# Patient Record
Sex: Female | Born: 1955
Health system: Southern US, Community
[De-identification: ages and names within clinical notes are randomized; demographics above are authoritative.]

## PROBLEM LIST (undated history)

## (undated) DIAGNOSIS — C801 Malignant (primary) neoplasm, unspecified: Secondary | ICD-10-CM

## (undated) HISTORY — PX: CHOLECYSTECTOMY: SHX55

## (undated) HISTORY — PX: TONSILLECTOMY: SUR1361

---

## 2014-10-31 ENCOUNTER — Ambulatory Visit (INDEPENDENT_AMBULATORY_CARE_PROVIDER_SITE_OTHER): Payer: BC Managed Care – PPO

## 2014-10-31 ENCOUNTER — Other Ambulatory Visit: Payer: Self-pay | Admitting: Podiatry

## 2014-10-31 ENCOUNTER — Ambulatory Visit (INDEPENDENT_AMBULATORY_CARE_PROVIDER_SITE_OTHER): Payer: BC Managed Care – PPO | Admitting: Podiatry

## 2014-10-31 ENCOUNTER — Encounter: Payer: Self-pay | Admitting: Podiatry

## 2014-10-31 VITALS — BP 129/78 | HR 61 | Resp 16 | Ht 64.0 in | Wt 190.0 lb

## 2014-10-31 DIAGNOSIS — M779 Enthesopathy, unspecified: Secondary | ICD-10-CM

## 2014-10-31 DIAGNOSIS — L84 Corns and callosities: Secondary | ICD-10-CM

## 2014-10-31 DIAGNOSIS — M2041 Other hammer toe(s) (acquired), right foot: Secondary | ICD-10-CM

## 2014-10-31 MED ORDER — TRIAMCINOLONE ACETONIDE 10 MG/ML IJ SUSP
10.0000 mg | Freq: Once | INTRAMUSCULAR | Status: AC
  Administered 2014-10-31: 10 mg

## 2014-10-31 NOTE — Progress Notes (Signed)
   Subjective:    Patient ID: Stacey Cox, female    DOB: 04-06-1956, 58 y.o.   MRN: [id number]  HPI Comments: "I have a corn"  Patient c/o aching 5th toe right for several years. There is a callused area lateral side. She is unable to wear certain shoes. Tried padding and soaking-no help.  Toe Pain       Review of Systems  HENT: Positive for sinus pressure.   All other systems reviewed and are negative.      Objective:   Physical Exam        Assessment & Plan:

## 2014-11-02 NOTE — Progress Notes (Signed)
Subjective:     Patient ID: Stacey Cox, female   DOB: 17-Sep-1956, 58 y.o.   MRN: [id number]  HPI patient presents stating she's been developing a lot of pain in the fifth toe of her right foot with lesion formation that she uses chemical on which is no longer giving her satisfactory relief. Makes shoe gear very difficult and is becoming increasingly a problem for her   Review of Systems  All other systems reviewed and are negative.      Objective:   Physical Exam  Constitutional: She is oriented to person, place, and time.  Cardiovascular: Intact distal pulses.   Musculoskeletal: Normal range of motion.  Neurological: She is oriented to person, place, and time.  Skin: Skin is warm.  Nursing note and vitals reviewed.  neurovascular status intact with muscle strength adequate and range of motion of the subtalar and midtarsal joint within normal limits. Patient is noted to have good digital perfusion is well oriented 3 and has no equinus condition noted. The right fifth toe has moderate varus rotation present and does have a large keratotic lesion on the middle and proximal phalanx that's very painful when pressed with fluid buildup noted underneath the interphalangeal joint     Assessment:     Hammertoe deformity fifth right with rotational component severe keratotic lesion formation and fluid buildup consistent with interphalangeal joint capsulitis    Plan:     H&P and condition discussed with patient and x-ray reviewed. Today I did an interphalangeal joint injection 3 mg dexamethasone Kenalog 5 mg Xylocaine and debrided the lesion fully and applied pad. Discussed surgical intervention at one point in the future and reappoint when symptomatic

## 2015-01-06 ENCOUNTER — Ambulatory Visit: Payer: BC Managed Care – PPO | Admitting: Podiatry

## 2015-01-08 ENCOUNTER — Ambulatory Visit (INDEPENDENT_AMBULATORY_CARE_PROVIDER_SITE_OTHER): Payer: BC Managed Care – PPO | Admitting: Podiatry

## 2015-01-08 ENCOUNTER — Encounter: Payer: Self-pay | Admitting: Podiatry

## 2015-01-08 VITALS — BP 128/77 | HR 84 | Resp 16

## 2015-01-08 DIAGNOSIS — M2041 Other hammer toe(s) (acquired), right foot: Secondary | ICD-10-CM

## 2015-01-08 NOTE — Progress Notes (Signed)
Subjective:     Patient ID: Stacey Cox, female   DOB: [birth year], 59 y.o.   MRN: [id number]  HPI patient presents stating this fifth toe has really started to hurt me again and I only got about 4 weeks of relief after the last treatment. States it's becoming increasingly hard to wear shoe gear comfortably   Review of Systems     Objective:   Physical Exam Neurovascular status intact with muscle strength adequate and noted to have keratotic lesion and pain fifth toe right foot had a proximal phalanx with fluid buildup around the head of the proximal phalanx and discomfort with palpation    Assessment:     Chronic hammertoe deformity fifth right with keratotic lesion formation    Plan:     Reviewed condition and recommended at this point surgical intervention due to the pain she is experiencing. Explain the surgery to patient and what would be required and allowed her to read a consent form going over all possible complications and alternative treatments. She is willing to undergo surgery understanding risk and signs consent form after review and understands total recovery from this can be a proximally 6 months given all preoperative instructions at this time

## 2015-01-28 ENCOUNTER — Telehealth: Payer: Self-pay | Admitting: *Deleted

## 2015-01-28 NOTE — Telephone Encounter (Signed)
Check on her tomorrow and try to reschedule surgery

## 2015-01-28 NOTE — Telephone Encounter (Signed)
"  I need to figure out what I need to do to cancel my surgery that is scheduled for today at 9:30am.  I've been up all night throwing up."  Would you like to reschedule?  "Let me call you back when I figure out what is going on."  Okay, I hope you feel better.  "Thank you."

## 2015-01-28 NOTE — Telephone Encounter (Signed)
I cancelled patients first post-op appointment.

## 2015-01-30 ENCOUNTER — Telehealth: Payer: Self-pay | Admitting: *Deleted

## 2015-01-30 NOTE — Telephone Encounter (Signed)
I called patient.  Dr. Paulla Dolly wanted to follow-up and see how you are doing.  "This cold has really got me.  I can't seem to get rid of it.  Like my mother says, this is Pneumonia weather.  I will call you guys next week to reschedule my surgery."  Okay, well take care of yourself.

## 2015-02-03 ENCOUNTER — Encounter: Payer: BC Managed Care – PPO | Admitting: Podiatry

## 2017-01-20 ENCOUNTER — Encounter (HOSPITAL_COMMUNITY): Payer: Self-pay | Admitting: Emergency Medicine

## 2017-01-20 DIAGNOSIS — C50312 Malignant neoplasm of lower-inner quadrant of left female breast: Principal | ICD-10-CM | POA: Insufficient documentation

## 2017-01-20 DIAGNOSIS — C7802 Secondary malignant neoplasm of left lung: Secondary | ICD-10-CM | POA: Diagnosis not present

## 2017-01-20 DIAGNOSIS — M545 Low back pain: Secondary | ICD-10-CM | POA: Insufficient documentation

## 2017-01-20 DIAGNOSIS — C7951 Secondary malignant neoplasm of bone: Secondary | ICD-10-CM | POA: Diagnosis not present

## 2017-01-20 DIAGNOSIS — M549 Dorsalgia, unspecified: Secondary | ICD-10-CM | POA: Diagnosis present

## 2017-01-20 DIAGNOSIS — Z803 Family history of malignant neoplasm of breast: Secondary | ICD-10-CM | POA: Insufficient documentation

## 2017-01-20 DIAGNOSIS — F329 Major depressive disorder, single episode, unspecified: Secondary | ICD-10-CM | POA: Insufficient documentation

## 2017-01-20 DIAGNOSIS — F17210 Nicotine dependence, cigarettes, uncomplicated: Secondary | ICD-10-CM | POA: Diagnosis not present

## 2017-01-20 DIAGNOSIS — F419 Anxiety disorder, unspecified: Secondary | ICD-10-CM | POA: Insufficient documentation

## 2017-01-20 DIAGNOSIS — G893 Neoplasm related pain (acute) (chronic): Secondary | ICD-10-CM | POA: Diagnosis not present

## 2017-01-20 NOTE — ED Triage Notes (Signed)
Patient with lower back pain, radiating around her back to her hips and into her groin.  No nausea, no vomiting.  Pain started about a week ago, off and on.  Patient states she did take some Advil tonight, one tablet and it didn't really help.

## 2017-01-21 ENCOUNTER — Emergency Department (HOSPITAL_COMMUNITY): Payer: BC Managed Care – PPO

## 2017-01-21 ENCOUNTER — Observation Stay (HOSPITAL_COMMUNITY): Payer: BC Managed Care – PPO

## 2017-01-21 ENCOUNTER — Observation Stay (HOSPITAL_COMMUNITY)
Admission: EM | Admit: 2017-01-21 | Discharge: 2017-01-22 | Disposition: A | Discharge status: Home / Self Care | Payer: BC Managed Care – PPO | Attending: Internal Medicine | Admitting: Internal Medicine

## 2017-01-21 DIAGNOSIS — C7800 Secondary malignant neoplasm of unspecified lung: Secondary | ICD-10-CM

## 2017-01-21 DIAGNOSIS — N6320 Unspecified lump in the left breast, unspecified quadrant: Secondary | ICD-10-CM | POA: Insufficient documentation

## 2017-01-21 DIAGNOSIS — C799 Secondary malignant neoplasm of unspecified site: Secondary | ICD-10-CM

## 2017-01-21 DIAGNOSIS — F17210 Nicotine dependence, cigarettes, uncomplicated: Secondary | ICD-10-CM | POA: Diagnosis not present

## 2017-01-21 DIAGNOSIS — C7951 Secondary malignant neoplasm of bone: Secondary | ICD-10-CM

## 2017-01-21 DIAGNOSIS — M8458XA Pathological fracture in neoplastic disease, other specified site, initial encounter for fracture: Secondary | ICD-10-CM | POA: Diagnosis not present

## 2017-01-21 DIAGNOSIS — G893 Neoplasm related pain (acute) (chronic): Secondary | ICD-10-CM

## 2017-01-21 DIAGNOSIS — M8440XA Pathological fracture, unspecified site, initial encounter for fracture: Secondary | ICD-10-CM | POA: Insufficient documentation

## 2017-01-21 DIAGNOSIS — C7931 Secondary malignant neoplasm of brain: Secondary | ICD-10-CM

## 2017-01-21 DIAGNOSIS — M545 Low back pain: Secondary | ICD-10-CM | POA: Diagnosis not present

## 2017-01-21 DIAGNOSIS — C50919 Malignant neoplasm of unspecified site of unspecified female breast: Secondary | ICD-10-CM | POA: Diagnosis present

## 2017-01-21 DIAGNOSIS — Z807 Family history of other malignant neoplasms of lymphoid, hematopoietic and related tissues: Secondary | ICD-10-CM | POA: Diagnosis not present

## 2017-01-21 DIAGNOSIS — Z803 Family history of malignant neoplasm of breast: Secondary | ICD-10-CM | POA: Diagnosis not present

## 2017-01-21 DIAGNOSIS — C50912 Malignant neoplasm of unspecified site of left female breast: Secondary | ICD-10-CM

## 2017-01-21 DIAGNOSIS — R590 Localized enlarged lymph nodes: Secondary | ICD-10-CM | POA: Diagnosis not present

## 2017-01-21 LAB — COMPREHENSIVE METABOLIC PANEL
ALT: 37 U/L (ref 14–54)
AST: 34 U/L (ref 15–41)
Albumin: 3.8 g/dL (ref 3.5–5.0)
Alkaline Phosphatase: 122 U/L (ref 38–126)
Anion gap: 10 (ref 5–15)
BUN: 14 mg/dL (ref 6–20)
CHLORIDE: 104 mmol/L (ref 101–111)
CO2: 26 mmol/L (ref 22–32)
Calcium: 9.7 mg/dL (ref 8.9–10.3)
Creatinine, Ser: 0.94 mg/dL (ref 0.44–1.00)
GFR calc non Af Amer: >60 mL/min (ref >60)
Glucose, Bld: 150 mg/dL — ABNORMAL HIGH (ref 65–99)
Potassium: 3.9 mmol/L (ref 3.5–5.1)
SODIUM: 140 mmol/L (ref 135–145)
Total Bilirubin: 0.5 mg/dL (ref 0.3–1.2)
Total Protein: 6.8 g/dL (ref 6.5–8.1)

## 2017-01-21 LAB — I-STAT CHEM 8, ED
BUN: 18 mg/dL (ref 6–20)
CREATININE: 1 mg/dL (ref 0.44–1.00)
Calcium, Ion: 1.16 mmol/L (ref 1.15–1.40)
Chloride: 103 mmol/L (ref 101–111)
Glucose, Bld: 149 mg/dL — ABNORMAL HIGH (ref 65–99)
HEMATOCRIT: 43 % (ref 36.0–46.0)
HEMOGLOBIN: 14.6 g/dL (ref 12.0–15.0)
POTASSIUM: 3.8 mmol/L (ref 3.5–5.1)
SODIUM: 142 mmol/L (ref 135–145)
TCO2: 28 mmol/L (ref 0–100)

## 2017-01-21 LAB — URINALYSIS, ROUTINE W REFLEX MICROSCOPIC
Bacteria, UA: NONE SEEN
Bilirubin Urine: NEGATIVE
GLUCOSE, UA: NEGATIVE mg/dL
Ketones, ur: NEGATIVE mg/dL
Leukocytes, UA: NEGATIVE
NITRITE: NEGATIVE
Protein, ur: NEGATIVE mg/dL
Specific Gravity, Urine: 1.025 (ref 1.005–1.030)
pH: 5 (ref 5.0–8.0)

## 2017-01-21 LAB — CBC
HEMATOCRIT: 42.3 % (ref 36.0–46.0)
Hemoglobin: 13.9 g/dL (ref 12.0–15.0)
MCH: 28.3 pg (ref 26.0–34.0)
MCHC: 32.9 g/dL (ref 30.0–36.0)
MCV: 86 fL (ref 78.0–100.0)
Platelets: 240 10*3/uL (ref 150–400)
RBC: 4.92 MIL/uL (ref 3.87–5.11)
RDW: 15 % (ref 11.5–15.5)
WBC: 11.2 10*3/uL — ABNORMAL HIGH (ref 4.0–10.5)

## 2017-01-21 MED ORDER — ONDANSETRON HCL 4 MG/2ML IJ SOLN
4.0000 mg | Freq: Four times a day (QID) | INTRAMUSCULAR | Status: DC | PRN
Start: 2017-01-21 — End: 2017-01-22
  Administered 2017-01-21: 4 mg via INTRAVENOUS
  Filled 2017-01-21: qty 2

## 2017-01-21 MED ORDER — TECHNETIUM TC 99M MEDRONATE IV KIT
25.0000 | PACK | Freq: Once | INTRAVENOUS | Status: AC | PRN
  Administered 2017-01-21: 25 via INTRAVENOUS

## 2017-01-21 MED ORDER — ONDANSETRON HCL 4 MG PO TABS
4.0000 mg | ORAL_TABLET | Freq: Four times a day (QID) | ORAL | Status: DC
  Filled 2017-01-21: qty 1

## 2017-01-21 MED ORDER — ENOXAPARIN SODIUM 40 MG/0.4ML ~~LOC~~ SOLN
40.0000 mg | SUBCUTANEOUS | Status: DC
  Administered 2017-01-21 – 2017-01-22 (×2): 40 mg via SUBCUTANEOUS
  Filled 2017-01-21 (×3): qty 0.4

## 2017-01-21 MED ORDER — HYDROMORPHONE HCL 2 MG/ML IJ SOLN
0.5000 mg | INTRAMUSCULAR | Status: DC | PRN
  Administered 2017-01-21 (×2): 0.5 mg via INTRAVENOUS
  Filled 2017-01-21 (×3): qty 1

## 2017-01-21 MED ORDER — HYDROMORPHONE HCL 2 MG/ML IJ SOLN
0.5000 mg | Freq: Once | INTRAMUSCULAR | Status: AC
  Administered 2017-01-21: 0.5 mg via INTRAVENOUS
  Filled 2017-01-21: qty 1

## 2017-01-21 MED ORDER — OXYCODONE-ACETAMINOPHEN 5-325 MG PO TABS
1.0000 | ORAL_TABLET | ORAL | Status: DC | PRN
  Administered 2017-01-21 – 2017-01-22 (×3): 1 via ORAL
  Filled 2017-01-21 (×3): qty 1

## 2017-01-21 NOTE — ED Notes (Signed)
MD to come down to speak with patient about transfer to [facility]

## 2017-01-21 NOTE — Progress Notes (Signed)
Patient resting in bed.  Many family members in room visiting.  Patient in good spirits with no complaints.  Scheduled for CT scan of Chest tonight.  No complaints of pain.

## 2017-01-21 NOTE — ED Notes (Signed)
Nuclear Med in for pt injection

## 2017-01-21 NOTE — ED Notes (Signed)
Doctor continued to be at bedside.

## 2017-01-21 NOTE — Progress Notes (Signed)
Oncology Consultation Received  I shall see the patient today. Plz admit to hospitalist service for pain mx  Metastatic Malignancy - Unknown Primary ? Breast with bone and pulmonary mets  Recs -MMG/US breast with core needle biopsy if lesions noted -CT chest -CA 15-3 and CA 27-29 -MRI brain -pain mx per hospitalist -full consult to follow today.  Sullivan Lone MD MS

## 2017-01-21 NOTE — ED Notes (Signed)
Paged IM about pt's request to go to [facility] and for pain medication

## 2017-01-21 NOTE — Consult Note (Signed)
[facility] Surgery Consult Note  Stacey Cox [birth year]  [id number].    Requesting MD: Rivet Chief Complaint/Reason for Consult: Possible metastatic breast cancer  HPI:  Stacey Cox is a 61yo female who presented to [facility] earlier today with 1 month of gradually worsening lower back pain. Denies any trauma. Patient states that her pain became more constant and severe today and that brought her to the ED. She tried OTC medications but today this did not help. Denies radicular symptoms. She also reports a lesion under her left breast. States that his been present for about 2 weeks. It is tender and draining foul smelling drainage. It is slowly growing in size. States that she has never had anything like this before.  Hospital workup: - CT shows left breast skin thickening and ulceration; multiple metastasis in the upper thoracic and lumbar spine including left L3 transverse process and right T12 transverse pathologic fractures; multiple pulmonary nodules measuring up to 9 mm - bone scan pending - cancer antigen labs pending  No significant PMH FH significant for mother and sister with breast cancer; unsure if they underwent genetic testing Abdominal surgical history includes appendectomy and cholecystectomy Anticoagulants:  Smokes 1/2 PPD Employment: professor at [employer]: Review of Systems  Constitutional: Negative.   HENT: Negative.   Eyes: Negative.   Respiratory: Negative.   Cardiovascular: Negative.   Gastrointestinal: Negative.   Genitourinary: Negative.   Musculoskeletal: Positive for joint pain.       Lower back pain  Skin:       Left breast ulcer  Neurological: Negative.   All systems reviewed and otherwise negative except for as above  History reviewed. No pertinent family history.  History reviewed. No pertinent past medical history.  Past Surgical History:  Procedure Laterality Date  . CHOLECYSTECTOMY    . TONSILLECTOMY      Social History:   reports that she has been smoking.  She has never used smokeless tobacco. She reports that she does not drink alcohol or use drugs.  Allergies:  Allergies  Allergen Reactions  . Thorazine [Chlorpromazine] Anaphylaxis     (Not in a hospital admission)  Prior to Admission medications   Not on File    Blood pressure 130/67, pulse 66, temperature 98.8 F (37.1 C), resp. rate 18, SpO2 97 %. Physical Exam: General: pleasant, WD/WN AA female who is laying in bed in NAD HEENT: head is normocephalic, atraumatic.  Sclera are noninjected.  Mouth is pink and moist Heart: regular, rate, and rhythm.  No obvious murmurs, gallops, or rubs noted.  Palpable pedal pulses bilaterally Lungs: CTAB, no wheezes, rhonchi, or rales noted.  Respiratory effort nonlabored Abd: soft, NT/ND, +BS, no masses, hernias, or organomegaly MS: all 4 extremities are symmetrical with no cyanosis, clubbing, or edema. Skin: warm and dry with no masses, lesions, or rashes Psych: A&Ox3 with an appropriate affect. Neuro: CM 2-12 intact, extremity CSM intact bilaterally, normal speech Left breast: 4-5cm circular inframammary ulceration with purulent drainage:     Results for orders placed or performed during the hospital encounter of 01/21/17 (from the past 48 hour(s))  Urinalysis, Routine w reflex microscopic     Status: Abnormal   Collection Time: 01/20/17 11:42 PM  Result Value Ref Range   Color, Urine YELLOW YELLOW   APPearance HAZY (A) CLEAR   Specific Gravity, Urine 1.025 1.005 - 1.030   pH 5.0 5.0 - 8.0   Glucose, UA NEGATIVE NEGATIVE mg/dL   Hgb urine dipstick SMALL (A) NEGATIVE  Bilirubin Urine NEGATIVE NEGATIVE   Ketones, ur NEGATIVE NEGATIVE mg/dL   Protein, ur NEGATIVE NEGATIVE mg/dL   Nitrite NEGATIVE NEGATIVE   Leukocytes, UA NEGATIVE NEGATIVE   RBC / HPF 0-5 0 - 5 RBC/hpf   WBC, UA 0-5 0 - 5 WBC/hpf   Bacteria, UA NONE SEEN NONE SEEN   Squamous Epithelial / LPF 0-5 (A) NONE SEEN   Mucous PRESENT    CBC     Status: Abnormal   Collection Time: 01/20/17 11:48 PM  Result Value Ref Range   WBC 11.2 (H) 4.0 - 10.5 K/uL   RBC 4.92 3.87 - 5.11 MIL/uL   Hemoglobin 13.9 12.0 - 15.0 g/dL   HCT 42.3 36.0 - 46.0 %   MCV 86.0 78.0 - 100.0 fL   MCH 28.3 26.0 - 34.0 pg   MCHC 32.9 30.0 - 36.0 g/dL   RDW 15.0 11.5 - 15.5 %   Platelets 240 150 - 400 K/uL  Comprehensive metabolic panel     Status: Abnormal   Collection Time: 01/20/17 11:48 PM  Result Value Ref Range   Sodium 140 135 - 145 mmol/L   Potassium 3.9 3.5 - 5.1 mmol/L   Chloride 104 101 - 111 mmol/L   CO2 26 22 - 32 mmol/L   Glucose, Bld 150 (H) 65 - 99 mg/dL   BUN 14 6 - 20 mg/dL   Creatinine, Ser 0.94 0.44 - 1.00 mg/dL   Calcium 9.7 8.9 - 10.3 mg/dL   Total Protein 6.8 6.5 - 8.1 g/dL   Albumin 3.8 3.5 - 5.0 g/dL   AST 34 15 - 41 U/L   ALT 37 14 - 54 U/L   Alkaline Phosphatase 122 38 - 126 U/L   Total Bilirubin 0.5 0.3 - 1.2 mg/dL   GFR calc non Af Amer >60 >60 mL/min   GFR calc Af Amer >60 >60 mL/min    Comment: (NOTE) The eGFR has been calculated using the CKD EPI equation. This calculation has not been validated in all clinical situations. eGFR's persistently <60 mL/min signify possible Chronic Kidney Disease.    Anion gap 10 5 - 15  I-Stat Chem 8, ED     Status: Abnormal   Collection Time: 01/21/17 12:54 AM  Result Value Ref Range   Sodium 142 135 - 145 mmol/L   Potassium 3.8 3.5 - 5.1 mmol/L   Chloride 103 101 - 111 mmol/L   BUN 18 6 - 20 mg/dL   Creatinine, Ser 1.00 0.44 - 1.00 mg/dL   Glucose, Bld 149 (H) 65 - 99 mg/dL   Calcium, Ion 1.16 1.15 - 1.40 mmol/L   TCO2 28 0 - 100 mmol/L   Hemoglobin 14.6 12.0 - 15.0 g/dL   HCT 43.0 36.0 - 46.0 %   Dg Cervical Spine 2 Or 3 Views  Result Date: 01/21/2017 CLINICAL DATA:  Findings of metastatic disease on recent CT examination with neck pain EXAM: CERVICAL SPINE - 3 VIEW COMPARISON:  None. FINDINGS: Six cervical segments are well visualized on this exam. The seventh  cervical segment is only partially visualized. No compression deformities are seen. Facet hypertrophic changes are noted commenced with the patient's age. The odontoid is within normal limits. IMPRESSION: Degenerative change without sclerosis to suggest metastatic disease. If clinically indicated MRI or bone scan would be much more sensitive for occult bony metastatic disease Electronically Signed   By: Inez Catalina M.D.   On: 01/21/2017 10:44   Dg Thoracic Spine 2 View  Result Date:  01/21/2017 CLINICAL DATA:  Abnormal CT EXAM: THORACIC SPINE 2 VIEWS COMPARISON:  CT from earlier in the same day FINDINGS: Pedicles are within normal limits. Vertebral body height is well maintained with the exception of T6 which demonstrates very mild compression deformity. This is of uncertain significance. MRI would be helpful for further evaluation as necessary. No other compression deformities are seen. Mild osteophytic changes are noted. The previously seen changes of T12 are not well appreciated on this exam. IMPRESSION: T6 compression deformity of a mild degree. This may be chronic in nature as no prior films are available for comparison. MRI may be helpful for further evaluation as clinically necessary. Electronically Signed   By: Inez Catalina M.D.   On: 01/21/2017 10:42   Dg Lumbar Spine 2-3 Views  Result Date: 01/21/2017 CLINICAL DATA:  Abnormal CT showing metastatic disease at L2 EXAM: LUMBAR SPINE - 2-3 VIEW COMPARISON:  None. FINDINGS: Vertebral body height is well maintained. Mild sclerosis is noted in the posterior inferior aspect of L2 corresponding to that seen on prior CT examination. Mild osteophytic changes are noted. IMPRESSION: L2 metastatic disease similar to that seen on recent CT. Electronically Signed   By: Inez Catalina M.D.   On: 01/21/2017 10:43   Ct Renal Stone Study  Result Date: 01/21/2017 CLINICAL DATA:  Low back and pelvic pain for 1 week. History of cholecystectomy. EXAM: CT ABDOMEN AND PELVIS  WITHOUT CONTRAST TECHNIQUE: Multidetector CT imaging of the abdomen and pelvis was performed following the standard protocol without IV contrast. COMPARISON:  None. FINDINGS: LOWER CHEST: LEFT breast skin thickening and possible ulceration. 4 mm RIGHT lower lobe, 5 mm RIGHT lower lobe subpleural nodule and 9 mm RIGHT middle lobe pulmonary nodule (series 6, image 1/29). Heart size is normal. HEPATOBILIARY: Status post cholecystectomy.  Normal liver. PANCREAS: Normal. SPLEEN: Normal. ADRENALS/URINARY TRACT: Kidneys are orthotopic, demonstrating normal size and morphology. No nephrolithiasis, hydronephrosis; limited assessment for renal masses on this nonenhanced examination. The unopacified ureters are normal in course and caliber. Urinary bladder is partially distended and unremarkable. Normal adrenal glands. STOMACH/BOWEL: Very small hiatal hernia. The stomach, small and large bowel are normal in course and caliber without inflammatory changes, sensitivity decreased by lack of enteric contrast. Moderate sigmoid, mild generalized colon diverticulosis. Normal appendix. VASCULAR/LYMPHATIC: Aortoiliac vessels are normal in course and caliber, trace calcific atherosclerosis. No lymphadenopathy by CT size criteria. REPRODUCTIVE: Normal. OTHER: No intraperitoneal free fluid or free air. MUSCULOSKELETAL: Ill-defined sclerotic lesion L2 without pathologic fracture. Mild sclerosis S1. Mildly expansile lesion LEFT L3 vertebral body with suspected pathologic fracture. Nondisplaced probable pathologic fracture RIGHT T12 transverse process. Moderate sacroiliac osteoarthrosis. IMPRESSION: No acute intra-abdominal or pelvic process. LEFT breast skin thickening and probable ulceration, recommend direct inspection. Multiple metastasis in the upper thoracic and lumbar spine included LEFT L3 transverse process and RIGHT T12 transverse pathologic fractures. Multiple pulmonary nodules measuring to 9 mm. Considering above findings, these  are suspicious for metastasis. Acute findings discussed with and reconfirmed by PA NICOLE PISCIOTTA on 01/21/2017 at 3:29 am. Electronically Signed   By: Elon Alas M.D.   On: 01/21/2017 03:30      Assessment/Plan Left breast ulcer, concern for metastatic breast cancer - family history of breast cancer in mother and sister - patient with gradually worsening lower back pain x1 month - per patient report she has had an ulcerative lesion under left breast for about 2 weeks - CT shows left breast skin thickening and ulceration; multiple metastasis in the upper thoracic  and lumbar spine including left L3 transverse process and right T12 transverse pathologic fractures; multiple pulmonary nodules measuring to 9 mm - bone scan pending - cancer antigen labs pending  Plan - Medicine admit for control of back pain. In regards to possible metastatic breast cancer, patient will need a biopsy of her left breast. If she remains in the hospital through Monday we can do an incisional biopsy on Monday; if she is discharged prior to Monday she will need follow-up at the [facility] for stereotactic or u/s guided biopsy as well as diagnostic mammograms. After these additional studies she will need outpatient follow-up with Dr. Ninfa Linden to discuss treatment options. We will continue to follow.   Jerrye Beavers, [facility] Surgery 01/21/2017, 1:59 PM Pager: [phone] Consults: [phone] Mon-Fri 7:00 am-4:30 pm Sat-Sun 7:00 am-11:30 am

## 2017-01-21 NOTE — ED Notes (Signed)
Pt went to X-ray.   

## 2017-01-21 NOTE — Progress Notes (Signed)
1600 Received pt from ED via stretcher. A&Ox4. Reoriented to room set up. Wound to under left breast with purulent drainage, + odor. Cleansed, wet to dry dressing applied.

## 2017-01-21 NOTE — Consult Note (Addendum)
[name]    HEMATOLOGY/ONCOLOGY CONSULTATION NOTE  Date of Service: 01/21/2017  Patient Care Team: No Pcp Per Patient as PCP - General (General Practice)  CHIEF COMPLAINTS/PURPOSE OF CONSULTATION:  Concern for metastatic breast cancer    HISTORY OF PRESENTING ILLNESS:   Stacey Cox is a wonderful 61 y.o. female who has been referred to [place] by Dr Lucious Groves, DO  for evaluation and management of likely metastatic breast cancer.  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 She also notes having developed left lower inner breast discomfort and  Ulceration over the last several weeks.  Patient has a CT abd renal proctocol to R/o kidney stones that showed Multiple metastasis in the thoracic and lumbar spine included LEFT L3 transverse process and RIGHT T12 transverse pathologic fractures. Multiple pulmonary nodules measuring to 9 mm.   Patient notes no weight loss no night sweat. Last mammogram >10 yrs ago.  No headaches no overt FND FHx of breast cancer in her mother and sister.  Patient noted that she has a phobia of the medical system and therefore had not actively sought a PCP or routine cares. She was anxious about how she how be able to management her live "alone". Supportive counseling was provided.  A CT chest, bone scan and MRI brain were recommended to complete staging workup.  CT chest was done which showed Primary left breast cancer with left axillary and subpectoral adenopathy.  Metastatic disease in the thorax with multiple pulmonary nodules mild mediastinal adenopathy. Left hilar soft tissue prominence may be central pulmonary nodule or adenopathy. Osseous metastatic disease with mild T6 compression fracture with lucent lesion anteriorly and lucencies in the posterior elements of T9 through T12. Fracture of right anterior rib has surrounding callus is likely a pathologic fracture.  Bone  scan was done which showed significant osseous metastatic disease involving spine, calvarium and rt ribs cage.  Patient was admitted for symptoms control and expedited workup. She has been seen by Surgery.  MEDICAL HISTORY:  Patient has had limited PCP followup. No known chronic medical problems  SURGICAL HISTORY: Past Surgical History:  Procedure Laterality Date  . CHOLECYSTECTOMY    . TONSILLECTOMY      SOCIAL HISTORY: Social History   Social History  . Marital status: Single    Spouse name: N/A  . Number of children: N/A  . Years of education: N/A   Occupational History  . Not on file.   Social History Main Topics  . Smoking status: Current Every Day Smoker  . Smokeless tobacco: Never Used  . Alcohol use No  . Drug use: No  . Sexual activity: Not on file   Other Topics Concern  . Not on file   Social History Narrative  . No narrative on file  Cigarette smoker 1/2PPD for about 40 yrs Social alcohol use No drugs Professor at [employer] no children Has  FAMILY HISTORY:  Mother with h/o breast cancer in her 69's Sister with breast cancer in her 70ys and later was diagnosed with multiple myeloma. (Patient is not aware of any specific breast cancer mutations present)  ALLERGIES:  is allergic to thorazine [chlorpromazine].  MEDICATIONS:  Current Facility-Administered Medications  Medication Dose Route Frequency Provider Last Rate Last Dose  . enoxaparin (LOVENOX) injection 40 mg  40 mg Subcutaneous Q24H Juliet Rude, MD   40 mg at 01/21/17 1444  . HYDROmorphone (DILAUDID)  injection 0.5 mg  0.5 mg Intravenous Q4H PRN Holley Raring, MD      . ondansetron [facility]) injection 4 mg  4 mg Intravenous Q6H PRN Jerrye Beavers, PA-C   4 mg at 01/21/17 1638  . ondansetron (ZOFRAN) tablet 4 mg  4 mg Oral Q8H PRN Holley Raring, MD      . oxyCODONE-acetaminophen (PERCOCET/ROXICET) 5-325 MG per tablet 1 tablet  1 tablet Oral Q4H PRN Juliet Rude, MD   1 tablet at  01/21/17 1844    REVIEW OF SYSTEMS:    10 Point review of Systems was done is negative except as noted above.  PHYSICAL EXAMINATION: ECOG PERFORMANCE STATUS: 1 - Symptomatic but completely ambulatory  . Vitals:   01/21/17 0930 01/21/17 1104  BP: 130/91 130/67  Pulse: 73 66  Resp:  18  Temp:  98.8 F (37.1 C)   There were no vitals filed for this visit. .There is no height or weight on file to calculate BMI.  GENERAL:alert, in no acute distress and comfortable SKIN: skin color, texture, turgor are normal, no rashes or significant lesions EYES: normal, conjunctiva are pink and non-injected, sclera clear OROPHARYNX:no exudate, no erythema and lips, buccal mucosa, and tongue normal  NECK: supple, no JVD, thyroid normal size, non-tender, without nodularity LYMPH:  no palpable lymphadenopathy in the cervical, axillary or inguinal LUNGS: clear to auscultation with normal respiratory effort BREAST- left breast ulceration and induration lower inner quadrant with surround skin showing peau de orange appearance. Palpable left axillary LN+ HEART: regular rate & rhythm,  no murmurs and no lower extremity edema ABDOMEN: abdomen soft, non-tender, normoactive bowel sounds  Musculoskeletal: No focal spinal tenderness on palpation. PSYCH: alert & oriented x 3 with fluent speech NEURO: no focal motor/sensory deficits  LABORATORY DATA:  I have reviewed the data as listed  . CBC Latest Ref Rng & Units 01/21/2017 01/20/2017  WBC 4.0 - 10.5 K/uL - 11.2(H)  Hemoglobin 12.0 - 15.0 g/dL 14.6 13.9  Hematocrit 36.0 - 46.0 % 43.0 42.3  Platelets 150 - 400 K/uL - 240    . CMP Latest Ref Rng & Units 01/21/2017 01/20/2017  Glucose 65 - 99 mg/dL 149(H) 150(H)  BUN 6 - 20 mg/dL 18 14  Creatinine 0.44 - 1.00 mg/dL 1.00 0.94  Sodium 135 - 145 mmol/L 142 140  Potassium 3.5 - 5.1 mmol/L 3.8 3.9  Chloride 101 - 111 mmol/L 103 104  CO2 22 - 32 mmol/L - 26  Calcium 8.9 - 10.3 mg/dL - 9.7  Total Protein 6.5 -  8.1 g/dL - 6.8  Total Bilirubin 0.3 - 1.2 mg/dL - 0.5  Alkaline Phos 38 - 126 U/L - 122  AST 15 - 41 U/L - 34  ALT 14 - 54 U/L - 37     RADIOGRAPHIC STUDIES: I have personally reviewed the radiological images as listed and agreed with the findings in the report. Dg Cervical Spine 2 Or 3 Views  Result Date: 01/21/2017 CLINICAL DATA:  Findings of metastatic disease on recent CT examination with neck pain EXAM: CERVICAL SPINE - 3 VIEW COMPARISON:  None. FINDINGS: Six cervical segments are well visualized on this exam. The seventh cervical segment is only partially visualized. No compression deformities are seen. Facet hypertrophic changes are noted commenced with the patient's age. The odontoid is within normal limits. IMPRESSION: Degenerative change without sclerosis to suggest metastatic disease. If clinically indicated MRI or bone scan would be much more sensitive for occult bony metastatic disease Electronically  Signed   By: Inez Catalina M.D.   On: 01/21/2017 10:44   Dg Thoracic Spine 2 View  Result Date: 01/21/2017 CLINICAL DATA:  Abnormal CT EXAM: THORACIC SPINE 2 VIEWS COMPARISON:  CT from earlier in the same day FINDINGS: Pedicles are within normal limits. Vertebral body height is well maintained with the exception of T6 which demonstrates very mild compression deformity. This is of uncertain significance. MRI would be helpful for further evaluation as necessary. No other compression deformities are seen. Mild osteophytic changes are noted. The previously seen changes of T12 are not well appreciated on this exam. IMPRESSION: T6 compression deformity of a mild degree. This may be chronic in nature as no prior films are available for comparison. MRI may be helpful for further evaluation as clinically necessary. Electronically Signed   By: Inez Catalina M.D.   On: 01/21/2017 10:42   Dg Lumbar Spine 2-3 Views  Result Date: 01/21/2017 CLINICAL DATA:  Abnormal CT showing metastatic disease at L2 EXAM:  LUMBAR SPINE - 2-3 VIEW COMPARISON:  None. FINDINGS: Vertebral body height is well maintained. Mild sclerosis is noted in the posterior inferior aspect of L2 corresponding to that seen on prior CT examination. Mild osteophytic changes are noted. IMPRESSION: L2 metastatic disease similar to that seen on recent CT. Electronically Signed   By: Inez Catalina M.D.   On: 01/21/2017 10:43   Ct Chest Wo Contrast  Result Date: 01/21/2017 CLINICAL DATA:  Metastatic breast cancer. Thoraco lumbar metastasis on bone scan. Right rib activity on bone scan. EXAM: CT CHEST WITHOUT CONTRAST TECHNIQUE: Multidetector CT imaging of the chest was performed following the standard protocol without IV contrast. COMPARISON:  Bone scan earlier this day. Included lung bases from CT abdomen/ pelvis earlier this day. FINDINGS: Cardiovascular: Mild atherosclerosis of the thoracic aorta. The heart is normal in size. A few coronary artery calcifications are seen. Mediastinum/Nodes: 15 mm subcarinal node. 11 mm lower paratracheal node. 8 mm right upper paratracheal node. Small bilateral supraclavicular nodes. Multiple enlarged small left axillary lymph nodes measure up to 14 mm. There prominent left subpectoral nodes. Limited assessment for hilar adenopathy given lack of IV contrast, there is left hilar prominence. No enlarged right axillary lymph nodes. The esophagus is decompressed. Visualized thyroid gland is normal. Lungs/Pleura: Multifocal pulmonary nodules consistent with metastatic disease. These involve all lobes of both lungs. For example paramediastinal right middle lobe nodule measures 10 mm, series 205 image 56 left upper lobe perifissural nodule measures 13 x 8 mm image 35. Central left upper lobe nodule versus adenopathy with surrounding spiculation. Upper Abdomen: Cholecystectomy clips. No adrenal nodule. Patient had CT abdomen/ pelvis earlier this day. Musculoskeletal: Irregular medial left breast lesion measures approximately 3  cm. There is diffuse left breast skin thickening and probable ulceration. Probable additional left breast nodule in the region of skin thickening. There is callus formation about right lateral eighth rib. Lucency within right T12 transverse process. There is equivocal diminished density T11, T10, and T9 right transverse processes. Minimal anterior compression of T6 with likely underlying lucent lesion. There is no evidence of epidural tumor extension. IMPRESSION: 1. Primary left breast cancer with left axillary and subpectoral adenopathy. 2. Metastatic disease in the thorax with multiple pulmonary nodules mild mediastinal adenopathy. Left hilar soft tissue prominence may be central pulmonary nodule or adenopathy. 3. Osseous metastatic disease with mild T6 compression fracture with lucent lesion anteriorly and lucencies in the posterior elements of T9 through T12. Fracture of right anterior rib has  surrounding callus is likely a pathologic fracture. Electronically Signed   By: Jeb Levering M.D.   On: 01/21/2017 21:55   Nm Bone Scan Whole Body  Result Date: 01/21/2017 CLINICAL DATA:  Metastatic breast carcinoma EXAM: NUCLEAR MEDICINE WHOLE BODY BONE SCAN TECHNIQUE: Whole body anterior and posterior images were obtained approximately 3 hours after intravenous injection of radiopharmaceutical. RADIOPHARMACEUTICALS:  22.3 mCi Technetium-58mMDP IV COMPARISON:  CT from earlier in the same day FINDINGS: Adequate uptake of radioactive tracer is noted throughout the bony skeleton. Bilateral renal activity is noted. There multifocal areas of increased activity identified within the thoracic and lumbar spine at what appear to be T8, T10, T11 and T12 as well as L2 and L3. Some of these changes correspond to those seen on recent CT examination. Additionally there is increased activity in the lateral and anterior right rib cage which correspond to healing fractures in the right ribcage. Scattered degenerative changes are  noted particularly in the feet bilaterally. Increased activity is noted in the left posterior calvarium highly suspicious for metastatic lesion. This was not well appreciated on recent plain film examination of the cervical spine. IMPRESSION: Changes consistent with multifocal metastatic disease throughout the lower thoracic and lumbar spine as described. Increased activity is noted within the right ribcage laterally consistent with healing fractures. These may be pathologic in nature. Calvarial metastatic disease is noted on the left posteriorly. Electronically Signed   By: MInez CatalinaM.D.   On: 01/21/2017 15:51   Ct Renal Stone Study  Result Date: 01/21/2017 CLINICAL DATA:  Low back and pelvic pain for 1 week. History of cholecystectomy. EXAM: CT ABDOMEN AND PELVIS WITHOUT CONTRAST TECHNIQUE: Multidetector CT imaging of the abdomen and pelvis was performed following the standard protocol without IV contrast. COMPARISON:  None. FINDINGS: LOWER CHEST: LEFT breast skin thickening and possible ulceration. 4 mm RIGHT lower lobe, 5 mm RIGHT lower lobe subpleural nodule and 9 mm RIGHT middle lobe pulmonary nodule (series 6, image 1/29). Heart size is normal. HEPATOBILIARY: Status post cholecystectomy.  Normal liver. PANCREAS: Normal. SPLEEN: Normal. ADRENALS/URINARY TRACT: Kidneys are orthotopic, demonstrating normal size and morphology. No nephrolithiasis, hydronephrosis; limited assessment for renal masses on this nonenhanced examination. The unopacified ureters are normal in course and caliber. Urinary bladder is partially distended and unremarkable. Normal adrenal glands. STOMACH/BOWEL: Very small hiatal hernia. The stomach, small and large bowel are normal in course and caliber without inflammatory changes, sensitivity decreased by lack of enteric contrast. Moderate sigmoid, mild generalized colon diverticulosis. Normal appendix. VASCULAR/LYMPHATIC: Aortoiliac vessels are normal in course and caliber, trace  calcific atherosclerosis. No lymphadenopathy by CT size criteria. REPRODUCTIVE: Normal. OTHER: No intraperitoneal free fluid or free air. MUSCULOSKELETAL: Ill-defined sclerotic lesion L2 without pathologic fracture. Mild sclerosis S1. Mildly expansile lesion LEFT L3 vertebral body with suspected pathologic fracture. Nondisplaced probable pathologic fracture RIGHT T12 transverse process. Moderate sacroiliac osteoarthrosis. IMPRESSION: No acute intra-abdominal or pelvic process. LEFT breast skin thickening and probable ulceration, recommend direct inspection. Multiple metastasis in the upper thoracic and lumbar spine included LEFT L3 transverse process and RIGHT T12 transverse pathologic fractures. Multiple pulmonary nodules measuring to 9 mm. Considering above findings, these are suspicious for metastasis. Acute findings discussed with and reconfirmed by PA NICOLE PISCIOTTA on 01/21/2017 at 3:29 am. Electronically Signed   By: CElon AlasM.D.   On: 01/21/2017 03:30    ASSESSMENT & PLAN:   61yo with   1) Newly diagnosed metastatic Left breast cancer with ulcerated left lower inner  quadrant breast lesion with left axillary and subpectoral lymphadenopathy, significant osseous metastases, pulmonary metastases. Plan -clinical impression and imaging results were discussed in details with the patient. -MRI brain pending to complete breast cancer workup -she will need tissue diagnosis -- seen by surgery team. Options might be incisional biopsy of the ulcerated ulcer vs MMG/US guided core needle biopsy at the breast center vs US guided left axillary LN biopsy -will need adequate tissues for hormonal studies and Her2 testing. -palliative systemic therapies based on tissue diagnosis and hormonal/Her2 status -might need genetic counselor referral at a later time if patient is so inclined.  2) Back pain due to multiple osseous metastases in the spine with pathologic fractures. No overt spinal or significant  radicular involvement. 3) neoplasm related pain. Plan -pain controlled with IV dialudid -would need to be transitioned to PO Pain medications. Would recommend starting a long acting pain med Oxycontin (81m po q12h) + prn break throught oxycodone. Since pain will take some time to control with systemic therapies. Would consider palliative RT or IR ablation/vertebroplasty is needed as outpatient for symptom control. Might not be needed based on current symptoms -will need to be on Xgeva q4weeks as outpatient for bone mets. -would need to check 25)H vit D and treat patient for vit D deficiency if present.  4) h/o depression. -patient might need to consider starting on an SSRI (would prefer celexa or lexapro - less interactions with treatment meds) - might also help with hot flash systems from treatments. -would also give ativan prn for anxiety.  5) h/o Medical phobias -counseled regarding the fact that we will make sure her voice is heard and that she feels in control of her medical cares.  We will see the patient on Monday is still in the hospital or in clinic if discharged. If biopsy not done in the hospital - she will need MMG/US guided biopsy setup in the breast cancer on discharge.  I will message my schedulers to have patient setup for outpatient medical oncology f/u with Dr KIrene Limboin about 1 week for continued cares and to plan systemic therapies.   All of the patients questions were answered with apparent satisfaction. The patient knows to call the clinic with any problems, questions or concerns.  I spent 60 minutes counseling the patient face to face. The total time spent in the appointment was 80 minutes and more than 50% was on counseling and direct patient cares.    GSullivan LoneMD MCayceAAHIVMS SNew Jersey Eye Center PaCMillennium Surgical Center LLCHematology/Oncology Physician [facility] (Office):       [phone](Work cell):  [phone](Fax):           [phone] 01/21/2017 1:00 PM

## 2017-01-21 NOTE — ED Notes (Signed)
Doctor at bedside.

## 2017-01-21 NOTE — ED Provider Notes (Signed)
[place] DEPT Provider Note   CSN: [id number] Arrival date & time: 01/20/17  2327     History   Chief Complaint Chief Complaint  Patient presents with  . Back Pain  . Groin Pain    HPI   Blood pressure 144/93, pulse 76, temperature 98.1 F (36.7 C), temperature source Oral, resp. rate 16, SpO2 100 %.  Stacey Cox is a 61 y.o. female complaining of Intermittent low back pain onset 1 week ago radiating around to the bilateral pelvis pain is severe, it does not affect her ambulation. She denies fevers, chills, incontinence, numbness or weakness. She denies unintentional weight loss, easy bruising or bleeding.    History reviewed. No pertinent past medical history.  Patient Active Problem List   Diagnosis Date Noted  . Metastatic breast cancer ([place]) 01/21/2017    Past Surgical History:  Procedure Laterality Date  . CHOLECYSTECTOMY    . TONSILLECTOMY      OB History    No data available       Home Medications    Prior to Admission medications   Not on File    Family History History reviewed. No pertinent family history.  Social History Social History  Substance Use Topics  . Smoking status: Current Every Day Smoker  . Smokeless tobacco: Never Used  . Alcohol use No     Allergies   Thorazine [chlorpromazine]   Review of Systems Review of Systems  10 systems reviewed and found to be negative, except as noted in the HPI.   Physical Exam Updated Vital Signs BP 137/80 (BP Location: Right Arm)   Pulse 64   Temp 98.4 F (36.9 C) (Oral)   Resp 16   SpO2 98%   Physical Exam  Constitutional: She is oriented to person, place, and time. She appears well-developed and well-nourished. No distress.  HENT:  Head: Normocephalic and atraumatic.  Mouth/Throat: Oropharynx is clear and moist.  Eyes: Conjunctivae and EOM are normal. Pupils are equal, round, and reactive to light.  Neck: Normal range of motion.  Cardiovascular: Normal rate, regular  rhythm and intact distal pulses.   Pulmonary/Chest: Effort normal and breath sounds normal.  Abdominal: Soft. There is no tenderness.  Musculoskeletal: Normal range of motion.  Neurological: She is alert and oriented to person, place, and time. She displays normal reflexes.  No point tenderness to percussion of lumbar spinal processes.  No TTP or paraspinal muscular spasm. Strength is 5 out of 5 to bilateral lower extremities at hip and knee; extensor hallucis longus 5 out of 5. Ankle strength 5 out of 5, no clonus, neurovascularly intact. No saddle anaesthesia. Patellar reflexes are 2+ bilaterally.     Skin: She is not diaphoretic.  Psychiatric: She has a normal mood and affect.  Nursing note and vitals reviewed.    ED Treatments / Results  Labs (all labs ordered are listed, but only abnormal results are displayed) Labs Reviewed  URINALYSIS, ROUTINE W REFLEX MICROSCOPIC - Abnormal; Notable for the following:       Result Value   APPearance HAZY (*)    Hgb urine dipstick SMALL (*)    Squamous Epithelial / LPF 0-5 (*)    All other components within normal limits  CBC - Abnormal; Notable for the following:    WBC 11.2 (*)    All other components within normal limits  COMPREHENSIVE METABOLIC PANEL - Abnormal; Notable for the following:    Glucose, Bld 150 (*)    All other components within  normal limits  I-STAT CHEM 8, ED - Abnormal; Notable for the following:    Glucose, Bld 149 (*)    All other components within normal limits    EKG  EKG Interpretation None       Radiology Ct Renal Stone Study  Result Date: 01/21/2017 CLINICAL DATA:  Low back and pelvic pain for 1 week. History of cholecystectomy. EXAM: CT ABDOMEN AND PELVIS WITHOUT CONTRAST TECHNIQUE: Multidetector CT imaging of the abdomen and pelvis was performed following the standard protocol without IV contrast. COMPARISON:  None. FINDINGS: LOWER CHEST: LEFT breast skin thickening and possible ulceration. 4 mm RIGHT  lower lobe, 5 mm RIGHT lower lobe subpleural nodule and 9 mm RIGHT middle lobe pulmonary nodule (series 6, image 1/29). Heart size is normal. HEPATOBILIARY: Status post cholecystectomy.  Normal liver. PANCREAS: Normal. SPLEEN: Normal. ADRENALS/URINARY TRACT: Kidneys are orthotopic, demonstrating normal size and morphology. No nephrolithiasis, hydronephrosis; limited assessment for renal masses on this nonenhanced examination. The unopacified ureters are normal in course and caliber. Urinary bladder is partially distended and unremarkable. Normal adrenal glands. STOMACH/BOWEL: Very small hiatal hernia. The stomach, small and large bowel are normal in course and caliber without inflammatory changes, sensitivity decreased by lack of enteric contrast. Moderate sigmoid, mild generalized colon diverticulosis. Normal appendix. VASCULAR/LYMPHATIC: Aortoiliac vessels are normal in course and caliber, trace calcific atherosclerosis. No lymphadenopathy by CT size criteria. REPRODUCTIVE: Normal. OTHER: No intraperitoneal free fluid or free air. MUSCULOSKELETAL: Ill-defined sclerotic lesion L2 without pathologic fracture. Mild sclerosis S1. Mildly expansile lesion LEFT L3 vertebral body with suspected pathologic fracture. Nondisplaced probable pathologic fracture RIGHT T12 transverse process. Moderate sacroiliac osteoarthrosis. IMPRESSION: No acute intra-abdominal or pelvic process. LEFT breast skin thickening and probable ulceration, recommend direct inspection. Multiple metastasis in the upper thoracic and lumbar spine included LEFT L3 transverse process and RIGHT T12 transverse pathologic fractures. Multiple pulmonary nodules measuring to 9 mm. Considering above findings, these are suspicious for metastasis. Acute findings discussed with and reconfirmed by PA Tearia Gibbs on 01/21/2017 at 3:29 am. Electronically Signed   By: Elon Alas M.D.   On: 01/21/2017 03:30    Procedures Procedures (including critical care  time)  Medications Ordered in ED Medications  HYDROmorphone (DILAUDID) injection 0.5 mg (0.5 mg Intravenous Given 01/21/17 0532)     Initial Impression / Assessment and Plan / ED Course  I have reviewed the triage vital signs and the nursing notes.  Pertinent labs & imaging results that were available during my care of the patient were reviewed by me and considered in my medical decision making (see chart for details).     Vitals:   01/21/17 0430 01/21/17 0500 01/21/17 0515 01/21/17 0737  BP: 137/83 139/90 143/94 137/80  Pulse: 74 84 77 64  Resp:   16 16  Temp:    98.4 F (36.9 C)  TempSrc:    Oral  SpO2: 97% 96% 98% 98%    Medications  HYDROmorphone (DILAUDID) injection 0.5 mg (0.5 mg Intravenous Given 01/21/17 0532)    Chantea Langbehn is 61 y.o. female presenting with Low back pain worsening over the course of several days, neurologic exam is nonfocal. Noncontrast CT ordered to rule out kidney stones, surprisingly this test shows multiple metastases with pathologic fractures. They also note a ulceration to the left breast, I've asked this patient if she has any abnormality in the left breast she simply responds yes when I asked her to describe that she says no. I think this patient was aware of  the lesion and has been in denial. I've spoken with her sister about the findings and she is driving here from [place] to be with her. I discussed this case with the oncologist who recommends admission given her denial and the acuity of her situation we have this opportunity to get her into treatment for the metastatic cancer. This is an unassigned admission to internal medicine teaching service, case discussed with Dr. Arcelia Jew.     Final Clinical Impressions(s) / ED Diagnoses   Final diagnoses:  Metastatic breast cancer [facility])    New Prescriptions New Prescriptions   No medications on file     Monico Blitz, PA-C 01/21/17 [place], DO 01/25/17 [phone]

## 2017-01-21 NOTE — ED Notes (Signed)
Spoke with pharmacy regarding Lovenox listed on MAR. States will send medication via tube station.

## 2017-01-21 NOTE — H&P (Signed)
Date: 01/21/2017               Patient Name:  Stacey Cox MRN: [id number]  DOB: 01/15/1956 Age / Sex: 61 y.o., female   PCP: No Pcp Per Patient         Medical Service: Internal Medicine Teaching Service         Attending Physician: Dr. Lucious Groves, DO    First Contact: Dr. Gay Filler Pager: [phone]  Second Contact: Dr. Benjamine Mola Pager: [phone]       After Hours (After 5p/  First Contact Pager: [phone]  weekends / holidays): Second Contact Pager: [phone]   Chief Complaint: Back pain   History of Present Illness: Ms. Gorelick is a 61yo woman with hx of tobacco abuse presenting with acute worsening back pain. She reports several weeks ago she had back pain in her mid-upper back but over the last week her pain has been mostly in the lower back and pelvis. She describes the pain as throbbing, 6-8/10 in severity at home and now 4/10 after pain medication, intermittent, and radiating from her lower back to the anterior pelvis bilaterally. She denies any difficulty walking, decreased strength in her legs, loss of bowel/bladder control, or saddle anesthesia. A CT renal stone study was performed in the ED due to concerns for possible kidney stones and surprisingly this scan showed a left breast ulceration, multiple metastasis in the upper thoracic and lumbar spine including the left L3 transverse process and right T12 transverse process pathologic fractures, and multiple pulmonary nodules concerning for mets. Findings discussed with patient. She admits she noticed a "hardening" of her left breast a few weeks ago and has been ignoring it. She does admit to nipple discharge from the left breast that has been brown in color. She states both her mother and sister died from breast cancer (sister died just last year) and that was difficult for her. She does not know if they underwent BRCA testing. She reports having a mammogram at least 10 years ago that was normal but no recent mammogram. She denies any weight  loss, fevers, night sweats, headaches, vision changes, fatigue, chest pain, SOB, or abdominal pain. She has not had any pregnancies. Menstruation started at age 81 and menopause occurred sometime between age [age]. She is a current smoker about 1/2-3/4 ppd and has been smoking for at least 40 years. She has no family in the area but has 2 sisters in [place], [place] and close friends in the area.   Meds:  Not taking any medications   Allergies: Allergies as of 01/20/2017  . (No Known Allergies)   Past Medical/Surgical Hx: Cholecystectomy, tonsillectomy   Family History: Mother- breast cancer, deceased. Sister- breast cancer and multiple myeloma, deceased.   Social History: Currently smokes 1/2-3/4 ppd for last 40+ years. Denies alcohol and illicit drug use. Lives at home by herself. 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   Review of Systems: A complete ROS was negative except as per HPI.   Physical Exam: Blood pressure 142/89, pulse 66, temperature 98.4 F (36.9 C), temperature source Oral, resp. rate 16, SpO2 98 %. General: well-nourished woman sitting up in bed, NAD HEENT: [place]/AT, EOMI, sclera anicteric, mucus membranes moist CV: RRR, no m/g/r Breast: Right breast appears normal. No lymphadenopathy appreciated in R axilla. Left breast has a circular, ~3cm in width open wound with putrid odor, no active drainage. A hard mass is palpated in the lower inner quadrant  adjacent to the open wound. Dimpling skin changes present. Lymphadenopathy palpated in the R axilla.  Pulm: CTA bilaterally, breaths non-labored Abd: BS+, soft, non-tender  Ext: warm, no peripheral edema, full ROM Neuro: alert and oriented x 3. Strength 5/5 in upper and lower extremities bilaterally. Reflexes 2+ in patellars and ankles. Sensation symmetric and intact.    CT Abd/Pelvis wo contrast  No acute intra-abdominal or pelvic process.  LEFT breast skin thickening and probable ulceration,  recommend direct inspection.  Multiple metastasis in the upper thoracic and lumbar spine included LEFT L3 transverse process and RIGHT T12 transverse pathologic fractures.  Multiple pulmonary nodules measuring to 9 mm. Considering above findings, these are suspicious for metastasis.  Assessment & Plan by Problem:  Back Pain Likely 2/2 Metastatic Breast Cancer: Patient presenting with a several week hx of back that has acutely worsened over the past week. CT revealed numerous spinal metastases and pathologic fractures as well as pulmonary nodules concerning for mets. She has a significant fungating left breast mass on exam. Discussed findings with patient. She is willing to get further work up done in the hospital and follow up as an outpatient with Oncology for further diagnosis/treatment. Will obtain lumbar x-rays, NM bone scan, and consult general surgery for breast biopsy to confirm diagnosis. Also discussed with her the importance of family/friend support while going through this difficult time. Will control her pain with IV dilaudid. - Consult gen surg - f/u lumbar x-rays - f/u NM bone scan - IV dilaudid 0.5 mg q4h prn  Tobacco Abuse: Currently smoking 1/2-3/4 ppd. Encouraged patient to quit smoking, especially given likelihood of breast malignancy.    Diet: Heart healthy DVT PPx: Lovenox SQ Dispo: Admit patient to Observation with expected length of stay less than 2 midnights.  Signed: Juliet Rude, MD 01/21/2017, 9:01 AM  Pager: [phone]

## 2017-01-21 NOTE — ED Notes (Signed)
Pt. Just arrived to Pod C 20

## 2017-01-22 ENCOUNTER — Encounter (HOSPITAL_COMMUNITY): Payer: Self-pay | Admitting: Radiology

## 2017-01-22 ENCOUNTER — Observation Stay (HOSPITAL_COMMUNITY): Payer: BC Managed Care – PPO

## 2017-01-22 DIAGNOSIS — M8448XA Pathological fracture, other site, initial encounter for fracture: Secondary | ICD-10-CM

## 2017-01-22 DIAGNOSIS — C50912 Malignant neoplasm of unspecified site of left female breast: Secondary | ICD-10-CM | POA: Diagnosis not present

## 2017-01-22 DIAGNOSIS — C7951 Secondary malignant neoplasm of bone: Secondary | ICD-10-CM | POA: Diagnosis not present

## 2017-01-22 DIAGNOSIS — C7800 Secondary malignant neoplasm of unspecified lung: Secondary | ICD-10-CM

## 2017-01-22 DIAGNOSIS — R590 Localized enlarged lymph nodes: Secondary | ICD-10-CM

## 2017-01-22 DIAGNOSIS — G893 Neoplasm related pain (acute) (chronic): Secondary | ICD-10-CM

## 2017-01-22 DIAGNOSIS — F064 Anxiety disorder due to known physiological condition: Secondary | ICD-10-CM

## 2017-01-22 DIAGNOSIS — Z888 Allergy status to other drugs, medicaments and biological substances status: Secondary | ICD-10-CM

## 2017-01-22 DIAGNOSIS — N6489 Other specified disorders of breast: Secondary | ICD-10-CM

## 2017-01-22 DIAGNOSIS — Z803 Family history of malignant neoplasm of breast: Secondary | ICD-10-CM

## 2017-01-22 DIAGNOSIS — F0631 Mood disorder due to known physiological condition with depressive features: Secondary | ICD-10-CM

## 2017-01-22 LAB — CANCER ANTIGEN 15-3: CA 15-3: 32.4 U/mL — ABNORMAL HIGH (ref 0.0–25.0)

## 2017-01-22 LAB — HIV ANTIBODY (ROUTINE TESTING W REFLEX): HIV SCREEN 4TH GENERATION: NONREACTIVE

## 2017-01-22 LAB — CANCER ANTIGEN 27.29: CA 27.29: 37.6 U/mL (ref 0.0–38.6)

## 2017-01-22 MED ORDER — ESCITALOPRAM OXALATE 5 MG PO TABS: 5.0000 mg | ORAL_TABLET | Freq: Every day | ORAL | 0 refills | Status: DC

## 2017-01-22 MED ORDER — ONDANSETRON HCL 4 MG PO TABS: 4.0000 mg | ORAL_TABLET | Freq: Three times a day (TID) | ORAL | 0 refills | Status: DC | PRN

## 2017-01-22 MED ORDER — OXYCODONE-ACETAMINOPHEN 5-325 MG PO TABS
1.0000 | ORAL_TABLET | ORAL | 0 refills | Status: DC | PRN
Start: 2017-01-22 — End: 2017-01-28

## 2017-01-22 MED ORDER — LORAZEPAM 0.5 MG PO TABS: 0.5000 mg | ORAL_TABLET | Freq: Four times a day (QID) | ORAL | 0 refills | Status: DC | PRN

## 2017-01-22 MED ORDER — HYDROMORPHONE HCL 2 MG/ML IJ SOLN
0.5000 mg | INTRAMUSCULAR | Status: DC | PRN
  Administered 2017-01-22: 0.5 mg via INTRAVENOUS
  Filled 2017-01-22: qty 1

## 2017-01-22 MED ORDER — ESCITALOPRAM OXALATE 10 MG PO TABS
5.0000 mg | ORAL_TABLET | Freq: Every day | ORAL | Status: DC
  Administered 2017-01-22: 5 mg via ORAL
  Filled 2017-01-22: qty 1

## 2017-01-22 MED ORDER — ONDANSETRON HCL 4 MG PO TABS: 4.0000 mg | ORAL_TABLET | Freq: Three times a day (TID) | ORAL | Status: DC | PRN

## 2017-01-22 MED ORDER — LORAZEPAM 0.5 MG PO TABS
0.5000 mg | ORAL_TABLET | Freq: Four times a day (QID) | ORAL | Status: DC | PRN
Start: 2017-01-22 — End: 2017-01-22

## 2017-01-22 NOTE — Discharge Summary (Signed)
Name: Stacey Cox MRN: [id number] DOB: 20-Jun-1956 61 y.o. 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   LORazepam 0.5 MG tablet Commonly known as:  ATIVAN Take 1 tablet (0.5 mg total) by mouth every 6 (six) hours as needed for anxiety.   ondansetron 4 MG tablet Commonly known as:  ZOFRAN Take 1 tablet (4 mg total) by mouth every 8 (eight) hours as needed for nausea or vomiting.   oxyCODONE-acetaminophen 5-325 MG tablet Commonly known as:  PERCOCET/ROXICET Take 1 tablet by mouth every 4 (four) hours as needed for moderate pain.      Disposition and follow-up:   Stacey Cox was discharged from [facility] in Stable condition.  At the hospital follow up visit please address:  1.  New Breast Cancer: She should have close follow up with Oncology to ensure establishment of cancer treatment plan. Gen Surg is planning tissue diagnosis at the Breast Center.  Follow-up Appointments: Follow-up [place], [place] Follow up.   Specialties:  Hematology, Oncology Why:  Someone will call you to set up an appointment. Contact information: [place] [address] [phone]        [facility] Follow up.   Specialty:  Oncology Why:  You will have an appointment set-up to have a biopsy completed. Contact information: [place] [id number] [place]  [id number] [place] Follow up.   Why:  Someone will call you on Monday to set up an appointment Contact information: 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 Irene Limbo and Gen Surg, Dr. Ninfa Linden. Staging complete with CT chest/abdoment and MRI brain. Will plan outpt f/u w/ Oncology for further w/u and management. Tissue diagnosis per Gen Surg.  2) Cancer Pain: Primary complaint of lumbar back pain consistent with pathologic vertebral fx and osseous metastatic disease. Controlled ON on PO Percocet 5-325mg . Would favor continuing short-acting meds in opioid-naive pt until tolerance is established and daily requirement is determined. 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 Now w/ new diagnosis of cancer. Will plan to start Lexapro and give PRN ativan for management. - Lexapro 5mg  qD - Ativan 0.5mg  q6h PRN anxiety  Discharge Vitals:   BP 119/73 (BP Location: Right  Arm)   Pulse (!) 57   Temp 98.1 F (36.7 C) (Oral)   Resp 18   SpO2 97%   Pertinent Labs, Studies, and Procedures: As below.  CA 15-3 - 32.4 CA27.29 - 37.6  Procedures Performed:  Dg Cervical Spine 2 Or 3 Views  Result Date: 01/21/2017 CLINICAL DATA:  Findings of metastatic disease on recent CT examination with neck pain EXAM: CERVICAL SPINE - 3 VIEW COMPARISON:  None.  FINDINGS: Six cervical segments are well visualized on this exam. The seventh cervical segment is only partially visualized. No compression deformities are seen. Facet hypertrophic changes are noted commenced with the patient's age. The odontoid is within normal limits. IMPRESSION: Degenerative change without sclerosis to suggest metastatic disease. If clinically indicated MRI or bone scan would be much more sensitive for occult bony metastatic disease Electronically Signed   By: Inez Catalina M.D.   On: 01/21/2017 10:44   Dg Thoracic Spine 2 View  Result Date: 01/21/2017 CLINICAL DATA:  Abnormal CT EXAM: THORACIC SPINE 2 VIEWS COMPARISON:  CT from earlier in the same day FINDINGS: Pedicles are within normal limits. Vertebral body height is well maintained with the exception of T6 which demonstrates very mild compression deformity. This is of uncertain significance. MRI would be helpful for further evaluation as necessary. No other compression deformities are seen. Mild osteophytic changes are noted. The previously seen changes of T12 are not well appreciated on this exam. IMPRESSION: T6 compression deformity of a mild degree. This may be chronic in nature as no prior films are available for comparison. MRI may be helpful for further evaluation as clinically necessary. Electronically Signed   By: Inez Catalina M.D.   On: 01/21/2017 10:42   Dg Lumbar Spine 2-3 Views  Result Date: 01/21/2017 CLINICAL DATA:  Abnormal CT showing metastatic disease at L2 EXAM: LUMBAR SPINE - 2-3 VIEW COMPARISON:  None. FINDINGS: Vertebral body height is well maintained. Mild sclerosis is noted in the posterior inferior aspect of L2 corresponding to that seen on prior CT examination. Mild osteophytic changes are noted. IMPRESSION: L2 metastatic disease similar to that seen on recent CT. Electronically Signed   By: Inez Catalina M.D.   On: 01/21/2017 10:43   Ct Chest Wo Contrast  Result Date: 01/21/2017 CLINICAL DATA:  Metastatic  breast cancer. Thoraco lumbar metastasis on bone scan. Right rib activity on bone scan. EXAM: CT CHEST WITHOUT CONTRAST TECHNIQUE: Multidetector CT imaging of the chest was performed following the standard protocol without IV contrast. COMPARISON:  Bone scan earlier this day. Included lung bases from CT abdomen/ pelvis earlier this day. FINDINGS: Cardiovascular: Mild atherosclerosis of the thoracic aorta. The heart is normal in size. A few coronary artery calcifications are seen. Mediastinum/Nodes: 15 mm subcarinal node. 11 mm lower paratracheal node. 8 mm right upper paratracheal node. Small bilateral supraclavicular nodes. Multiple enlarged small left axillary lymph nodes measure up to 14 mm. There prominent left subpectoral nodes. Limited assessment for hilar adenopathy given lack of IV contrast, there is left hilar prominence. No enlarged right axillary lymph nodes. The esophagus is decompressed. Visualized thyroid gland is normal. Lungs/Pleura: Multifocal pulmonary nodules consistent with metastatic disease. These involve all lobes of both lungs. For example paramediastinal right middle lobe nodule measures 10 mm, series 205 image 56 left upper lobe perifissural nodule measures 13 x 8 mm image 35. Central left upper lobe nodule versus adenopathy with surrounding spiculation. Upper Abdomen: Cholecystectomy clips. No adrenal nodule. Patient had CT abdomen/ pelvis earlier this  day. Musculoskeletal: Irregular medial left breast lesion measures approximately 3 cm. There is diffuse left breast skin thickening and probable ulceration. Probable additional left breast nodule in the region of skin thickening. There is callus formation about right lateral eighth rib. Lucency within right T12 transverse process. There is equivocal diminished density T11, T10, and T9 right transverse processes. Minimal anterior compression of T6 with likely underlying lucent lesion. There is no evidence of epidural tumor extension.  IMPRESSION: 1. Primary left breast cancer with left axillary and subpectoral adenopathy. 2. Metastatic disease in the thorax with multiple pulmonary nodules mild mediastinal adenopathy. Left hilar soft tissue prominence may be central pulmonary nodule or adenopathy. 3. Osseous metastatic disease with mild T6 compression fracture with lucent lesion anteriorly and lucencies in the posterior elements of T9 through T12. Fracture of right anterior rib has surrounding callus is likely a pathologic fracture. Electronically Signed   By: Jeb Levering M.D.   On: 01/21/2017 21:55   Mr Brain Wo Contrast  Result Date: 01/22/2017 CLINICAL DATA:  Newly diagnosed metastatic left breast cancer. EXAM: MRI HEAD WITHOUT CONTRAST TECHNIQUE: Multiplanar, multiecho pulse sequences of the brain and surrounding structures were obtained without intravenous contrast. COMPARISON:  Nuclear medicine whole-body bone scan 01/21/2017 FINDINGS: The examination was ordered without IV contrast, and cerebral volume is within normal limits for age. The patient is severely claustrophobic. Brain: There is no evidence of acute infarct, intracranial hemorrhage, midline shift, or extra-axial fluid collection. A few small foci of T2 hyperintensity in the cerebral white matter are nonspecific and not greater than expected for patient's age. There is advanced cerebellar atrophy. Cerebral volume is within normal limits. There is a rounded 11 mm mass in the posterior fossa located along the anteroinferior margin of the right cerebellar hemisphere just posterior to the jugular bulb. This appears extra-axial in location and does not result in cerebellar mass effect. There is also a 6 mm nodular T2 hypointense extra-axial focus in the right aspect of the prepontine cistern just posterior to the right cavernous sinus. No definite cerebral mass lesions are identified, however sensitivity is reduced by the lack of IV contrast. There is no evidence of vasogenic  edema. There is evidence of mild dural thickening versus a tiny subdural collection in the left occipital region measuring 2-3 mm in thickness without associated mass effect. Vascular: Major intracranial vascular flow voids are preserved. Skull and upper cervical spine: Large area of confluent abnormal bone marrow signal in the left occipital skull overlying the area of dural thickening/ tiny collection described above and corresponding to the abnormal uptake on recent nuclear medicine bone scan. A smaller lesion is present in the right parietal skull. Sinuses/Orbits: Unremarkable orbits. Paranasal sinuses and mastoid air cells are clear. Other: None. IMPRESSION: 1. 11 mm extra-axial mass in the posterior fossa. Appearance favors a small meningioma over metastasis. 2. 6 mm nodule in the prepontine cistern, indeterminate. This could reflect an additional small meningioma. This is close to but not clearly contiguous with the right cavernous ICA to strongly suggest aneurysm. Postcontrast brain MRI is recommended for further evaluation. 3. Osseous skull metastases, including a large left occipital lobe lesion. Underlying thin extra-axial signal abnormality is favored to reflect mild dural thickening (either reactive or due to tumor infiltration) though a tiny subdural fluid collection/hematoma is also possible. This can be further evaluated on postcontrast MRI. 4. No definite parenchymal brain metastases, however evaluation is limited by lack of IV contrast. 5. Advanced cerebellar atrophy. Electronically Signed  By: Logan Bores M.D.   On: 01/22/2017 09:23   Nm Bone Scan Whole Body  Result Date: 01/21/2017 CLINICAL DATA:  Metastatic breast carcinoma EXAM: NUCLEAR MEDICINE WHOLE BODY BONE SCAN TECHNIQUE: Whole body anterior and posterior images were obtained approximately 3 hours after intravenous injection of radiopharmaceutical. RADIOPHARMACEUTICALS:  22.3 mCi Technetium-1m MDP IV COMPARISON:  CT from earlier in  the same day FINDINGS: Adequate uptake of radioactive tracer is noted throughout the bony skeleton. Bilateral renal activity is noted. There multifocal areas of increased activity identified within the thoracic and lumbar spine at what appear to be T8, T10, T11 and T12 as well as L2 and L3. Some of these changes correspond to those seen on recent CT examination. Additionally there is increased activity in the lateral and anterior right rib cage which correspond to healing fractures in the right ribcage. Scattered degenerative changes are noted particularly in the feet bilaterally. Increased activity is noted in the left posterior calvarium highly suspicious for metastatic lesion. This was not well appreciated on recent plain film examination of the cervical spine. IMPRESSION: Changes consistent with multifocal metastatic disease throughout the lower thoracic and lumbar spine as described. Increased activity is noted within the right ribcage laterally consistent with healing fractures. These may be pathologic in nature. Calvarial metastatic disease is noted on the left posteriorly. Electronically Signed   By: Inez Catalina M.D.   On: 01/21/2017 15:51   Ct Renal Stone Study  Result Date: 01/21/2017 CLINICAL DATA:  Low back and pelvic pain for 1 week. History of cholecystectomy. EXAM: CT ABDOMEN AND PELVIS WITHOUT CONTRAST TECHNIQUE: Multidetector CT imaging of the abdomen and pelvis was performed following the standard protocol without IV contrast. COMPARISON:  None. FINDINGS: LOWER CHEST: LEFT breast skin thickening and possible ulceration. 4 mm RIGHT lower lobe, 5 mm RIGHT lower lobe subpleural nodule and 9 mm RIGHT middle lobe pulmonary nodule (series 6, image 1/29). Heart size is normal. HEPATOBILIARY: Status post cholecystectomy.  Normal liver. PANCREAS: Normal. SPLEEN: Normal. ADRENALS/URINARY TRACT: Kidneys are orthotopic, demonstrating normal size and morphology. No nephrolithiasis, hydronephrosis; limited  assessment for renal masses on this nonenhanced examination. The unopacified ureters are normal in course and caliber. Urinary bladder is partially distended and unremarkable. Normal adrenal glands. STOMACH/BOWEL: Very small hiatal hernia. The stomach, small and large bowel are normal in course and caliber without inflammatory changes, sensitivity decreased by lack of enteric contrast. Moderate sigmoid, mild generalized colon diverticulosis. Normal appendix. VASCULAR/LYMPHATIC: Aortoiliac vessels are normal in course and caliber, trace calcific atherosclerosis. No lymphadenopathy by CT size criteria. REPRODUCTIVE: Normal. OTHER: No intraperitoneal free fluid or free air. MUSCULOSKELETAL: Ill-defined sclerotic lesion L2 without pathologic fracture. Mild sclerosis S1. Mildly expansile lesion LEFT L3 vertebral body with suspected pathologic fracture. Nondisplaced probable pathologic fracture RIGHT T12 transverse process. Moderate sacroiliac osteoarthrosis. IMPRESSION: No acute intra-abdominal or pelvic process. LEFT breast skin thickening and probable ulceration, recommend direct inspection. Multiple metastasis in the upper thoracic and lumbar spine included LEFT L3 transverse process and RIGHT T12 transverse pathologic fractures. Multiple pulmonary nodules measuring to 9 mm. Considering above findings, these are suspicious for metastasis. Acute findings discussed with and reconfirmed by PA NICOLE PISCIOTTA on 01/21/2017 at 3:29 am. Electronically Signed   By: Elon Alas M.D.   On: 01/21/2017 03:30   Consultations: Oncology - Dr. Irene Limbo Gen Surg - Dr. Ninfa Linden  Discharge Instructions: Discharge Instructions    Call MD for:  persistant nausea and vomiting    Complete by:  As directed  Call MD for:  redness, tenderness, or signs of infection (pain, swelling, redness, odor or green/yellow discharge around incision site)    Complete by:  As directed    Call MD for:  severe uncontrolled pain    Complete by:   As directed    Diet - low sodium heart healthy    Complete by:  As directed    Discharge instructions    Complete by:  As directed    You have pain medication prescribed to you, please call and let your doctor's know if your pain is not controlled. We have also started you on a medication for anxiety called Lexapro, which you should take everyday. We also gave you a prescription for Ativan which you can take as needed for acute anxiety.  You will have follow up with Oncology and appointments set up at the [facility] for biopsy. We will also have our staff call you to set up an appointment in the Internal Medicine Center on the first floor of this hospital.   Increase activity slowly    Complete by:  As directed       Signed: Holley Raring, MD 01/22/2017, 10:03 AM   Pager: [phone]

## 2017-01-22 NOTE — Progress Notes (Signed)
Assumed care of this patient tonight. Patient currently sleeping. NAD noted. Will continue to monitor throughout the night.

## 2017-01-22 NOTE — Progress Notes (Signed)
Patient discharged home.  Discharge instructions reviewed and Rxs for Zofran, Lexapro, Percocet, and Ativan given. Patient verbalized understanding.  No complaints at discharge.

## 2017-01-22 NOTE — Progress Notes (Signed)
Patient resting in room this morning.  Leaving unit to go to MRI.  Patient states that she has no pain this morning.

## 2017-01-22 NOTE — Progress Notes (Signed)
Patient requested that her dressing change be done after her shower this am. She also refused her scheduled  am Zofran stating she was not nauseated. She requested crackers and coke this morning so she can take a Percocet then dressing change then shower then back to bed. Will pass on in hand off report this am.

## 2017-01-22 NOTE — Progress Notes (Addendum)
Subjective: Currently, the patient is feeling okay. She notes that her pain has been well controlled on oral Percocet ON, not requiring IV meds. She had some nausea yesterday with pain meds on empty stomach, no further episodes. She is still understandably anxious about her new cancer diagnosis. Follow up with oncology was outlined and next steps for tissue diagnosis were explained.  Interval Events: Chest CT shows widespread pulmonary and osseous metastasis. Brain MRI shows meningioma and no definite parenchymal mass to suggests metastasis. Contrasted study recommended for further investigation.  Objective: Vital signs in last 24 hours: Vitals:   01/21/17 1410 01/21/17 1630 01/21/17 2132 01/22/17 0546  BP: 134/96 128/89 122/79 119/73  Pulse: 64 81 69 (!) 57  Resp: 16  17 18   Temp:  98.4 F (36.9 C) 98.4 F (36.9 C) 98.1 F (36.7 C)  TempSrc:  Oral Oral Oral  SpO2: 98% 93% 97% 97%   Physical Exam: Physical Exam  Constitutional: She is oriented to person, place, and time. She appears well-developed. She is cooperative. No distress.  Cardiovascular: Normal rate, regular rhythm, intact distal pulses and normal pulses.   Pulmonary/Chest: Effort normal and breath sounds normal. No respiratory distress. Right breast exhibits no inverted nipple, no mass and no tenderness. Left breast exhibits mass and tenderness. Left breast exhibits no inverted nipple. Breasts are asymmetrical.  Fungating ulcerated lesion under the left breast, mild TTP. Was bandaged with minimal discharge.  Abdominal: Soft. Bowel sounds are normal. There is no tenderness.  Musculoskeletal: She exhibits no edema or tenderness.  Neurological: She is alert and oriented to person, place, and time.   Labs: CBC:  Recent Labs Lab 01/20/17 2348 01/21/17 0054  WBC 11.2*  --   HGB 13.9 14.6  HCT 42.3 43.0  MCV 86.0  --   PLT [id number]  --    Metabolic Panel:  Recent Labs Lab 01/20/17 2348 01/21/17 0054  NA 140 142    K 3.9 3.8  CL 104 103  CO2 26  --   GLUCOSE 150* 149*  BUN 14 18  CREATININE 0.94 1.00  CALCIUM 9.7  --   ALT 37  --   ALKPHOS 122  --   BILITOT 0.5  --   PROT 6.8  --   ALBUMIN 3.8  --     Medications: Scheduled Medications: . enoxaparin (LOVENOX) injection  40 mg Subcutaneous Q24H   PRN Medications: HYDROmorphone (DILAUDID) injection, ondansetron (ZOFRAN) IV, ondansetron, oxyCODONE-acetaminophen  Assessment/Plan: Pt is a 61 y.o. yo female with a PMHx of tobacco abuse and family h/o breast cancer in first degree relative x2 who was admitted on 01/21/2017 with symptoms of back pain and ulcerated left breast lesion, which was determined to be secondary to metastatic breast cancer.  1) Newly diagnosed metastatic Left breast cancer with ulcerated left lower inner quadrant breast lesion with left axillary and subpectoral lymphadenopathy, significant osseous metastases, pulmonary metastases: Seen by Oncology, Dr. Irene Limbo and Gen Surg, Dr. Ninfa Linden. Staging complete with CT chest/abdoment and MRI brain. Will plan outpt f/u w/ Oncology for further w/u and management. Tissue diagnosis per Gen Surg.  2) Cancer Pain: Primary complaint of lumbar back pain consistent with pathologic vertebral fx and osseous metastatic disease. Controlled ON on PO Percocet 5-325mg . Would favor continuing short-acting meds in opioid-naive pt until tolerance is established and daily requirement is determined. - Percocet 5-325 q4h PRN cancer pain - Zofran 4mg  PO q6h PRN nausea  3) Cancer associated anxiety/depression: Pt has phobia of medical system  at baseline, with mother and sister who both died of breast cancer. Now w/ new diagnosis of cancer. Will plan to start Lexapro and give PRN ativan for management. - Lexapro 5mg  qD - Ativan 0.5mg  q6h PRN anxiety  Length of Stay: 0 day(s) Dispo: Anticipated discharge today to outpt f/u w/ oncology.  Holley Raring, MD Pager: [phone] (7AM-5PM) 01/22/2017, 9:23 AM

## 2017-01-24 ENCOUNTER — Other Ambulatory Visit: Payer: Self-pay | Admitting: *Deleted

## 2017-01-24 ENCOUNTER — Telehealth: Payer: Self-pay | Admitting: Hematology

## 2017-01-24 ENCOUNTER — Other Ambulatory Visit: Payer: Self-pay | Admitting: Hematology

## 2017-01-24 DIAGNOSIS — C50312 Malignant neoplasm of lower-inner quadrant of left female breast: Secondary | ICD-10-CM

## 2017-01-24 MED ORDER — OXYCODONE HCL ER 10 MG PO T12A: 10.0000 mg | EXTENDED_RELEASE_TABLET | Freq: Two times a day (BID) | ORAL | 0 refills | Status: DC

## 2017-01-24 NOTE — Telephone Encounter (Signed)
Received a call fromt he pt's friend [name] to help with getting biopsy and appt scheduled. [name] was told to call our office to schedule a bx. Notified [name] and she stated the number to central scheduling was given for the pt to setup bx. [name] cld back and stated that central scheduling gave her the number to the [facility] and was confused about why she needed to contact them. At this point I offered to help and get the pt scheduled.[facility] and got the pt's bx scheduled for 3/9 at 9am. Also scheduled the pt to see Dr. Irene Limbo on 3/14 at 1230pm. [name] is aware to bring the pt 30 minutes early. Voiced understanding and agreed to the appts

## 2017-01-25 ENCOUNTER — Telehealth: Payer: Self-pay | Admitting: Hematology

## 2017-01-25 NOTE — Telephone Encounter (Signed)
Received a call from the pt's friend to have appt moved up to 3/13 at 12pm because the pt is really anxious. Ms. Suszanne Conners also stated that the bx has been moved to 3/8 at 845am. Aware to have the pt arrive 30 minutes early.

## 2017-01-27 ENCOUNTER — Telehealth: Payer: Self-pay | Admitting: *Deleted

## 2017-01-27 ENCOUNTER — Other Ambulatory Visit: Payer: Self-pay | Admitting: Hematology

## 2017-01-27 DIAGNOSIS — C50312 Malignant neoplasm of lower-inner quadrant of left female breast: Secondary | ICD-10-CM

## 2017-01-27 NOTE — Telephone Encounter (Signed)
Spoke with patient's friend Stacey Cox and patient.  "I left a message about Stacey Cox's pain with bowels.  She has not had a BM in a week."  Stacey Cox says she is "I drink 64 oz water daily.  I prefer taste of chocolate shakes and smoothies when I eat every four hours to take pain medicines.  No nausea or vomiting.  I am passing gas and feel it moving down.  abdomen is not swollen or bloated.  Started Senokot yesterday."  Due to taking narcotics advised she take a daily laxative regimine.  Other laxatives to try are M.O.M, Gwendolyn Lima, Dulcolax tabs or senokot-S.  Advised she eat fruits and vegetables.  Pain meds can be taken without food but try to get more fiber not just shakes for meals.  Walk in the house a few times a day, increase water intake and drink hot beverages to stimulate bowels.    "OKay, I think we're good now."  No further questions or complaints.

## 2017-01-28 ENCOUNTER — Other Ambulatory Visit: Payer: Self-pay | Admitting: Hematology

## 2017-01-28 ENCOUNTER — Ambulatory Visit
Admission: RE | Admit: 2017-01-28 | Discharge: 2017-01-28 | Disposition: A | Discharge status: Home / Self Care | Payer: BC Managed Care – PPO | Source: Ambulatory Visit | Attending: Hematology | Admitting: Hematology

## 2017-01-28 ENCOUNTER — Telehealth: Payer: Self-pay | Admitting: Hematology

## 2017-01-28 ENCOUNTER — Telehealth: Payer: Self-pay

## 2017-01-28 ENCOUNTER — Other Ambulatory Visit: Payer: Self-pay | Admitting: *Deleted

## 2017-01-28 ENCOUNTER — Encounter (HOSPITAL_COMMUNITY): Payer: Self-pay | Admitting: Emergency Medicine

## 2017-01-28 ENCOUNTER — Telehealth: Payer: Self-pay | Admitting: *Deleted

## 2017-01-28 ENCOUNTER — Emergency Department (HOSPITAL_COMMUNITY)
Admission: EM | Admit: 2017-01-28 | Discharge: 2017-01-29 | Disposition: A | Discharge status: Home / Self Care | Payer: BC Managed Care – PPO | Attending: Emergency Medicine | Admitting: Emergency Medicine

## 2017-01-28 DIAGNOSIS — N6310 Unspecified lump in the right breast, unspecified quadrant: Secondary | ICD-10-CM

## 2017-01-28 DIAGNOSIS — C50312 Malignant neoplasm of lower-inner quadrant of left female breast: Secondary | ICD-10-CM

## 2017-01-28 DIAGNOSIS — Z85118 Personal history of other malignant neoplasm of bronchus and lung: Secondary | ICD-10-CM | POA: Diagnosis not present

## 2017-01-28 DIAGNOSIS — Z79899 Other long term (current) drug therapy: Secondary | ICD-10-CM | POA: Diagnosis not present

## 2017-01-28 DIAGNOSIS — Z853 Personal history of malignant neoplasm of breast: Secondary | ICD-10-CM | POA: Insufficient documentation

## 2017-01-28 DIAGNOSIS — Z87891 Personal history of nicotine dependence: Secondary | ICD-10-CM | POA: Diagnosis not present

## 2017-01-28 DIAGNOSIS — M545 Low back pain, unspecified: Secondary | ICD-10-CM

## 2017-01-28 DIAGNOSIS — C7951 Secondary malignant neoplasm of bone: Secondary | ICD-10-CM | POA: Diagnosis not present

## 2017-01-28 HISTORY — DX: Malignant (primary) neoplasm, unspecified: C80.1

## 2017-01-28 MED ORDER — OXYCODONE-ACETAMINOPHEN 5-325 MG PO TABS: 1.0000 | ORAL_TABLET | ORAL | 0 refills | Status: DC | PRN

## 2017-01-28 NOTE — ED Provider Notes (Signed)
[place] DEPT Provider Note   CSN: [id number] Arrival date & time: 01/28/17  2218  By signing my name below, I, Reola Mosher, attest that this documentation has been prepared under the direction and in the presence of Virgel Manifold, MD. Electronically Signed: Reola Mosher, ED Scribe. 01/29/17. 12:01 AM.  History   Chief Complaint Chief Complaint  Patient presents with  . Back Pain   The history is provided by the patient. No language interpreter was used.    HPI Comments: Stacey Cox is a 61 y.o. female with a PMHx of metastatic breast cancer, who presents to the Emergency Department complaining of intermittent, worsening lower back pain beginning one week ago. Pt states that her pain today is similar to her typical pain associated with her breast cancer, for which she is prescribed Oxycodone and Oxycontin (which she started taking one week ago), and she has been taking without significant relief of her pain. She also notes that over the past week she has been increasingly constipated with some relief with taking stool softeners. Her pain is exacerbated with laying supine and with generalized movements. Pt denies numbness, paraesthesias, urgency, frequency, hematuria, dysuria, difficulty urinating  Past Medical History:  Diagnosis Date  . Cancer [facility])    Metastatic Breast Cancer   Patient Active Problem List   Diagnosis Date Noted  . 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 Malignant neoplasm metastatic to lung ([place])   . Neoplasm related pain   . Metastatic breast cancer ([place]) 01/21/2017  . Pathologic fracture   . Breast mass, left    Past Surgical History:  Procedure Laterality Date  . CHOLECYSTECTOMY    . TONSILLECTOMY     OB History    No data available     Home Medications    Prior to Admission medications   Medication Sig Start Date End Date Taking? Authorizing Provider  escitalopram (LEXAPRO) 5 MG tablet Take 1 tablet (5 mg total) by mouth daily. 01/22/17    Holley Raring, MD  LORazepam (ATIVAN) 0.5 MG tablet Take 1 tablet (0.5 mg total) by mouth every 6 (six) hours as needed for anxiety. 01/22/17   Holley Raring, MD  ondansetron (ZOFRAN) 4 MG tablet Take 1 tablet (4 mg total) by mouth every 8 (eight) hours as needed for nausea or vomiting. 01/22/17   Holley Raring, MD  oxyCODONE (OXYCONTIN) 10 mg 12 hr tablet Take 1 tablet (10 mg total) by mouth every 12 (twelve) hours. 01/24/17   [name], MD  oxyCODONE-acetaminophen (PERCOCET/ROXICET) 5-325 MG tablet Take 1 tablet by mouth every 4 (four) hours as needed for moderate pain. 01/28/17   [name], MD   Family History No family history on file.  Social History Social History  Substance Use Topics  . Smoking status: Former Smoker    Types: Cigarettes    Quit date: 01/21/2017  . Smokeless tobacco: Never Used  . Alcohol use No   Allergies   Thorazine [chlorpromazine]  Review of Systems Review of Systems  Gastrointestinal: Positive for constipation.  Genitourinary: Negative for difficulty urinating, dysuria, frequency, hematuria and urgency.  Musculoskeletal: Positive for back pain.  Neurological: Negative for numbness.       Negative for paraesthesias.   All other systems reviewed and are negative.  Physical Exam Updated Vital Signs BP 156/89 (BP Location: Right Arm)   Pulse 71   Temp 98.3 F (36.8 C) (Oral)   Resp 16   SpO2 100%   Physical Exam  Constitutional:  She appears well-developed and well-nourished. No distress.  HENT:  Head: Normocephalic and atraumatic.  Eyes: Conjunctivae are normal.  Neck: Normal range of motion.  Cardiovascular: Normal rate.   Pulmonary/Chest: Effort normal.  Abdominal: She exhibits no distension.  Musculoskeletal: Normal range of motion. She exhibits tenderness.  Mild tenderness in lower lumbar region and sacrum. Back is otherwise normal to inspection.  Neurological: She is alert.  Skin: No pallor.  Psychiatric: She has a normal  mood and affect. Her behavior is normal.  Nursing note and vitals reviewed.  ED Treatments / Results  DIAGNOSTIC STUDIES: Oxygen Saturation is 100% on RA, normal by my interpretation.   COORDINATION OF CARE: 11:57 PM-Discussed next steps with pt. Pt verbalized understanding and is agreeable with the plan.   Labs (all labs ordered are listed, but only abnormal results are displayed) Labs Reviewed - No data to display  EKG  EKG Interpretation None      Radiology US Breast Ltd Uni Left Inc Axilla  Result Date: 01/28/2017 CLINICAL DATA:  Patient presents for evaluation of ulcerated left breast mass. EXAM: 2D DIGITAL DIAGNOSTIC BILATERAL MAMMOGRAM WITH CAD AND ADJUNCT TOMO ULTRASOUND BILATERAL BREAST COMPARISON:  None ACR Breast Density Category b: There are scattered areas of fibroglandular density. FINDINGS: Within the medial aspect of the left breast there is a 4.1 cm ulcerated mass. Slightly anterior to this within the medial left breast middle depth is a 2.3 cm oval lobular mass. Within the upper inner left breast there is a 1.4 cm mass with associated distortion. Within the slightly medial right breast middle depth there is a 0.8 cm oval circumscribed mass. Multiple prominent left axillary lymph nodes are demonstrated on mammography. Mammographic images were processed with CAD. On physical exam, there is an ulcerated mass within the lower inner left breast. Additionally there is a palpable mass within the periareolar left breast. Targeted ultrasound is performed, showing an ill-defined difficult to evaluate mass within the left breast 9 o'clock position 5 cm from the nipple with superficial ulceration. This mass measures up to approximately 3 x 3 x 3.4 cm. Within the left breast 9 o'clock position 1 cm from the nipple there is a 3.7 x 2.9 x 1.6 cm irregular hypoechoic mass which appears to have invade the adjacent skin. Within left breast 10 o'clock position 3 cm from nipple there is a 1.2 x  1.0 x 1.4 cm irregular taller than wide mixed echogenicity mass. There are least 4 cortically thickened enlarged left axillary lymph nodes measuring up to 1 cm in thickness. Within the right breast 2 o'clock position 5 cm from nipple there is a 5 x 3 x 6 mm simple cyst, corresponding with mammographically identified mass. IMPRESSION: Ulcerated mass within the medial left breast most compatible with primary breast malignancy. There are 2 additional suspicious masses within the left breast concerning for additional sites of disease. Multiple cortically thickened left axillary lymph nodes concerning for axillary metastasis. RECOMMENDATION: Ultrasound-guided core needle biopsy large ulcerated left breast mass. Ultrasound-guided core needle biopsy cortically thickened left axillary lymph node. Depending upon treatment plan, the patient may need biopsies of the additional suspicious left breast masses. I have discussed the findings and recommendations with the patient. Results were also provided in writing at the conclusion of the visit. If applicable, a reminder letter will be sent to the patient regarding the next appointment. BI-RADS CATEGORY  5: Highly suggestive of malignancy. Electronically Signed   By: Lovey Newcomer M.D.   On: 01/28/2017 13:17  US Breast Ltd Uni Right Inc Axilla  Result Date: 01/28/2017 CLINICAL DATA:  Patient presents for evaluation of ulcerated left breast mass. EXAM: 2D DIGITAL DIAGNOSTIC BILATERAL MAMMOGRAM WITH CAD AND ADJUNCT TOMO ULTRASOUND BILATERAL BREAST COMPARISON:  None ACR Breast Density Category b: There are scattered areas of fibroglandular density. FINDINGS: Within the medial aspect of the left breast there is a 4.1 cm ulcerated mass. Slightly anterior to this within the medial left breast middle depth is a 2.3 cm oval lobular mass. Within the upper inner left breast there is a 1.4 cm mass with associated distortion. Within the slightly medial right breast middle depth there is a  0.8 cm oval circumscribed mass. Multiple prominent left axillary lymph nodes are demonstrated on mammography. Mammographic images were processed with CAD. On physical exam, there is an ulcerated mass within the lower inner left breast. Additionally there is a palpable mass within the periareolar left breast. Targeted ultrasound is performed, showing an ill-defined difficult to evaluate mass within the left breast 9 o'clock position 5 cm from the nipple with superficial ulceration. This mass measures up to approximately 3 x 3 x 3.4 cm. Within the left breast 9 o'clock position 1 cm from the nipple there is a 3.7 x 2.9 x 1.6 cm irregular hypoechoic mass which appears to have invade the adjacent skin. Within left breast 10 o'clock position 3 cm from nipple there is a 1.2 x 1.0 x 1.4 cm irregular taller than wide mixed echogenicity mass. There are least 4 cortically thickened enlarged left axillary lymph nodes measuring up to 1 cm in thickness. Within the right breast 2 o'clock position 5 cm from nipple there is a 5 x 3 x 6 mm simple cyst, corresponding with mammographically identified mass. IMPRESSION: Ulcerated mass within the medial left breast most compatible with primary breast malignancy. There are 2 additional suspicious masses within the left breast concerning for additional sites of disease. Multiple cortically thickened left axillary lymph nodes concerning for axillary metastasis. RECOMMENDATION: Ultrasound-guided core needle biopsy large ulcerated left breast mass. Ultrasound-guided core needle biopsy cortically thickened left axillary lymph node. Depending upon treatment plan, the patient may need biopsies of the additional suspicious left breast masses. I have discussed the findings and recommendations with the patient. Results were also provided in writing at the conclusion of the visit. If applicable, a reminder letter will be sent to the patient regarding the next appointment. BI-RADS CATEGORY  5: Highly  suggestive of malignancy. Electronically Signed   By: Lovey Newcomer M.D.   On: 01/28/2017 13:17   Mm Diag Breast Tomo Bilateral  Result Date: 01/28/2017 CLINICAL DATA:  Patient presents for evaluation of ulcerated left breast mass. EXAM: 2D DIGITAL DIAGNOSTIC BILATERAL MAMMOGRAM WITH CAD AND ADJUNCT TOMO ULTRASOUND BILATERAL BREAST COMPARISON:  None ACR Breast Density Category b: There are scattered areas of fibroglandular density. FINDINGS: Within the medial aspect of the left breast there is a 4.1 cm ulcerated mass. Slightly anterior to this within the medial left breast middle depth is a 2.3 cm oval lobular mass. Within the upper inner left breast there is a 1.4 cm mass with associated distortion. Within the slightly medial right breast middle depth there is a 0.8 cm oval circumscribed mass. Multiple prominent left axillary lymph nodes are demonstrated on mammography. Mammographic images were processed with CAD. On physical exam, there is an ulcerated mass within the lower inner left breast. Additionally there is a palpable mass within the periareolar left breast. Targeted ultrasound is performed, showing  an ill-defined difficult to evaluate mass within the left breast 9 o'clock position 5 cm from the nipple with superficial ulceration. This mass measures up to approximately 3 x 3 x 3.4 cm. Within the left breast 9 o'clock position 1 cm from the nipple there is a 3.7 x 2.9 x 1.6 cm irregular hypoechoic mass which appears to have invade the adjacent skin. Within left breast 10 o'clock position 3 cm from nipple there is a 1.2 x 1.0 x 1.4 cm irregular taller than wide mixed echogenicity mass. There are least 4 cortically thickened enlarged left axillary lymph nodes measuring up to 1 cm in thickness. Within the right breast 2 o'clock position 5 cm from nipple there is a 5 x 3 x 6 mm simple cyst, corresponding with mammographically identified mass. IMPRESSION: Ulcerated mass within the medial left breast most  compatible with primary breast malignancy. There are 2 additional suspicious masses within the left breast concerning for additional sites of disease. Multiple cortically thickened left axillary lymph nodes concerning for axillary metastasis. RECOMMENDATION: Ultrasound-guided core needle biopsy large ulcerated left breast mass. Ultrasound-guided core needle biopsy cortically thickened left axillary lymph node. Depending upon treatment plan, the patient may need biopsies of the additional suspicious left breast masses. I have discussed the findings and recommendations with the patient. Results were also provided in writing at the conclusion of the visit. If applicable, a reminder letter will be sent to the patient regarding the next appointment. BI-RADS CATEGORY  5: Highly suggestive of malignancy. Electronically Signed   By: Lovey Newcomer M.D.   On: 01/28/2017 13:17   [place] Axillary Node Core Biopsy Left  Result Date: 01/28/2017 CLINICAL DATA:  Patient with enlarged left axillary lymph node. EXAM: ULTRASOUND GUIDED CORE NEEDLE BIOPSY OF A LEFT AXILLARY NODE COMPARISON:  Previous exam(s). FINDINGS: I met with the patient and we discussed the procedure of ultrasound-guided biopsy, including benefits and alternatives. We discussed the high likelihood of a successful procedure. We discussed the risks of the procedure, including infection, bleeding, tissue injury, clip migration, and inadequate sampling. Informed written consent was given. The usual time-out protocol was performed immediately prior to the procedure. Using sterile technique and 1% Lidocaine as local anesthetic, under direct ultrasound visualization, a 14 gauge spring-loaded device was used to perform biopsy of cortically thickened left axillary lymph node using a lateral approach. At the conclusion of the procedure a HydroMARK tissue marker clip was deployed into the biopsy cavity. Follow up 2 view mammogram was performed and dictated separately.  IMPRESSION: Ultrasound guided biopsy of a cortically thickened left axillary lymph node. No apparent complications. Electronically Signed   By: Lovey Newcomer M.D.   On: 01/28/2017 12:15   Mm Clip Placement Left  Result Date: 01/28/2017 CLINICAL DATA:  Patient status post ultrasound-guided biopsy left breast mass and left axillary lymph node. EXAM: DIAGNOSTIC LEFT MAMMOGRAM POST ULTRASOUND BIOPSY COMPARISON:  Previous exam(s). FINDINGS: Mammographic images were obtained following ultrasound guided biopsy of left breast mass and left axillary lymph node. Ribbon shaped marking clip appropriately located within the left breast mass. HydroMARK clip within the left axillary lymph node. IMPRESSION: Appropriate position biopsy marking clip status post ultrasound-guided biopsy left breast mass and left axillary lymph node. Final Assessment: Post Procedure Mammograms for Marker Placement Electronically Signed   By: Lovey Newcomer M.D.   On: 01/28/2017 12:15   [place] Lt Breast Bx W Loc Dev 1st Lesion Img Bx Spec US Guide  Result Date: 01/28/2017 CLINICAL DATA:  Patient with  ulcerated left breast mass for percutaneous biopsy. EXAM: ULTRASOUND GUIDED LEFT BREAST CORE NEEDLE BIOPSY COMPARISON:  Previous exam(s). FINDINGS: I met with the patient and we discussed the procedure of ultrasound-guided biopsy, including benefits and alternatives. We discussed the high likelihood of a successful procedure. We discussed the risks of the procedure, including infection, bleeding, tissue injury, clip migration, and inadequate sampling. Informed written consent was given. The usual time-out protocol was performed immediately prior to the procedure. Using sterile technique and 1% Lidocaine as local anesthetic, under direct ultrasound visualization, a 12 gauge spring-loaded device was used to perform biopsy of left breast mass 9 o'clock position using a medial approach. At the conclusion of the procedure a ribbon shaped tissue marker clip was  deployed into the biopsy cavity. Follow up 2 view mammogram was performed and dictated separately. IMPRESSION: Ultrasound guided biopsy of left breast mass 9 o'clock position. No apparent complications. Electronically Signed   By: Lovey Newcomer M.D.   On: 01/28/2017 12:13   Procedures Procedures   Medications Ordered in ED Medications - No data to display  Initial Impression / Assessment and Plan / ED Course  I have reviewed the triage vital signs and the nursing notes.  Pertinent labs & imaging results that were available during my care of the patient were reviewed by me and considered in my medical decision making (see chart for details).    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 The recorded information has been reviewed is accurate. Virgel Manifold, MD.     Virgel Manifold, MD 02/08/17 [phone]

## 2017-01-28 NOTE — Telephone Encounter (Signed)
Sw pt's sister beverly regarding rx for oxycodone.  [facility] filled 45 tablets of 90 tablet rx due to insurance, and will not refill remaining rx.  This RN sw [facility] pharmacist who stated it is against policy to back fill an narcotic rx.  New rx will be required.  Reviewed with dr. Irene Limbo.  Ok to refill rx for 45 tablets.  Will see pt in office on Tuesday.  Pt informed.

## 2017-01-28 NOTE — Telephone Encounter (Signed)
Sister LVM asking for oxycodone refill. Called her back to ask which oxycodone and realized that Amaya with Dr Irene Limbo is already working on [employer]. Let sister know. Explained that if an rx needs to be written it will need to be picked up at [facility]. We cannot fax narcotic Rx.

## 2017-01-28 NOTE — ED Triage Notes (Signed)
Pt c/o lower back and pelvic pain that has been present for 1 week; pt was admitted to hospital last week after presenting to ER for this pain; dx with breast cancer; pt has been taking oxycodone and oxycotin which were relieving her pain until yesterday; pain has been unrelieved by pills since last night; last pain pill was at 8:30pm; pt is ambulatory

## 2017-01-28 NOTE — Telephone Encounter (Signed)
Patient's sister called and wanted a refill on oxycodone.  Dr Irene Limbo wrote a 90 count prescription and insurance would only allow 72.  She said patient ran out last night needs a refill  Call back is [phone]

## 2017-01-29 MED ORDER — KETOROLAC TROMETHAMINE 15 MG/ML IJ SOLN
30.0000 mg | Freq: Once | INTRAMUSCULAR | Status: AC
  Administered 2017-01-29: 30 mg via INTRAMUSCULAR
  Filled 2017-01-29: qty 2

## 2017-01-29 MED ORDER — HYDROMORPHONE HCL 1 MG/ML IJ SOLN
1.0000 mg | Freq: Once | INTRAMUSCULAR | Status: AC
  Administered 2017-01-29: 1 mg via INTRAVENOUS
  Filled 2017-01-29: qty 1

## 2017-01-29 MED ORDER — OXYCODONE HCL 5 MG PO TABS: 5.0000 mg | ORAL_TABLET | ORAL | 0 refills | Status: DC | PRN

## 2017-01-29 MED ORDER — LORAZEPAM 2 MG/ML IJ SOLN
1.0000 mg | Freq: Once | INTRAMUSCULAR | Status: AC
  Administered 2017-01-29: 1 mg via INTRAMUSCULAR
  Filled 2017-01-29: qty 1

## 2017-01-31 ENCOUNTER — Encounter (HOSPITAL_COMMUNITY): Payer: Self-pay

## 2017-01-31 ENCOUNTER — Emergency Department (HOSPITAL_COMMUNITY)
Admission: EM | Admit: 2017-01-31 | Discharge: 2017-01-31 | Disposition: A | Discharge status: Home / Self Care | Payer: BC Managed Care – PPO | Attending: Emergency Medicine | Admitting: Emergency Medicine

## 2017-01-31 DIAGNOSIS — Z79899 Other long term (current) drug therapy: Secondary | ICD-10-CM | POA: Insufficient documentation

## 2017-01-31 DIAGNOSIS — R600 Localized edema: Secondary | ICD-10-CM

## 2017-01-31 DIAGNOSIS — Z85118 Personal history of other malignant neoplasm of bronchus and lung: Secondary | ICD-10-CM | POA: Diagnosis not present

## 2017-01-31 DIAGNOSIS — Z87891 Personal history of nicotine dependence: Secondary | ICD-10-CM | POA: Diagnosis not present

## 2017-01-31 DIAGNOSIS — M7989 Other specified soft tissue disorders: Secondary | ICD-10-CM | POA: Diagnosis present

## 2017-01-31 DIAGNOSIS — Z853 Personal history of malignant neoplasm of breast: Secondary | ICD-10-CM | POA: Diagnosis not present

## 2017-01-31 LAB — CBC WITH DIFFERENTIAL/PLATELET
BASOS PCT: 0 %
Basophils Absolute: 0 10*3/uL (ref 0.0–0.1)
EOS ABS: 0.2 10*3/uL (ref 0.0–0.7)
Eosinophils Relative: 2 %
HCT: 37.6 % (ref 36.0–46.0)
HEMOGLOBIN: 12.5 g/dL (ref 12.0–15.0)
LYMPHS ABS: 2.6 10*3/uL (ref 0.7–4.0)
Lymphocytes Relative: 27 %
MCH: 28.3 pg (ref 26.0–34.0)
MCHC: 33.2 g/dL (ref 30.0–36.0)
MCV: 85.3 fL (ref 78.0–100.0)
Monocytes Absolute: 0.6 10*3/uL (ref 0.1–1.0)
Monocytes Relative: 6 %
NEUTROS PCT: 65 %
Neutro Abs: 6.2 10*3/uL (ref 1.7–7.7)
Platelets: 231 10*3/uL (ref 150–400)
RBC: 4.41 MIL/uL (ref 3.87–5.11)
RDW: 15.1 % (ref 11.5–15.5)
WBC: 9.6 10*3/uL (ref 4.0–10.5)

## 2017-01-31 LAB — COMPREHENSIVE METABOLIC PANEL
ALBUMIN: 3.9 g/dL (ref 3.5–5.0)
ALK PHOS: 127 U/L — AB (ref 38–126)
ALT: 46 U/L (ref 14–54)
AST: 35 U/L (ref 15–41)
Anion gap: 9 (ref 5–15)
BUN: 14 mg/dL (ref 6–20)
CALCIUM: 9.6 mg/dL (ref 8.9–10.3)
CO2: 25 mmol/L (ref 22–32)
CREATININE: 0.94 mg/dL (ref 0.44–1.00)
Chloride: 99 mmol/L — ABNORMAL LOW (ref 101–111)
GFR calc Af Amer: >60 mL/min (ref >60)
GFR calc non Af Amer: >60 mL/min (ref >60)
GLUCOSE: 101 mg/dL — AB (ref 65–99)
Potassium: 3.8 mmol/L (ref 3.5–5.1)
SODIUM: 133 mmol/L — AB (ref 135–145)
Total Bilirubin: 0.6 mg/dL (ref 0.3–1.2)
Total Protein: 7.1 g/dL (ref 6.5–8.1)

## 2017-01-31 LAB — URINALYSIS, ROUTINE W REFLEX MICROSCOPIC
BILIRUBIN URINE: NEGATIVE
GLUCOSE, UA: NEGATIVE mg/dL
Hgb urine dipstick: NEGATIVE
KETONES UR: NEGATIVE mg/dL
LEUKOCYTES UA: NEGATIVE
Nitrite: NEGATIVE
PH: 5 (ref 5.0–8.0)
Protein, ur: NEGATIVE mg/dL
Specific Gravity, Urine: 1.002 — ABNORMAL LOW (ref 1.005–1.030)

## 2017-01-31 NOTE — ED Provider Notes (Signed)
[facility] DEPT Provider Note   CSN: [id number] Arrival date & time: 01/31/17  1143     History   Chief Complaint Chief Complaint  Patient presents with  . Foot Swelling    BILATERAL    HPI Stacey Cox is a 61 y.o. 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 She reports that she has been fairly sedentary lying in bed due to pain in her spine where the metastasis is located after being prescribed pain medications. She began to start trying to walk around every 30 minutes or so and has now noticed this swelling in her ankles. She denies any shortness of breath, chest pain, calf pain, hemoptysis, history of DVT/PE, IV drug use, estrogen use, fever, chills, loss of bowel or bladder control or any other symptoms.  HPI  Past Medical History:  Diagnosis Date  . Cancer [facility])    Metastatic Breast Cancer    Patient Active Problem List   Diagnosis Date Noted  . Osseous metastasis ([place])   . Malignant neoplasm metastatic to lung ([place])   . Neoplasm related pain   . Metastatic breast cancer ([place]) 01/21/2017  . Pathologic fracture   . Breast mass, left     Past Surgical History:  Procedure Laterality Date  . CHOLECYSTECTOMY    . TONSILLECTOMY      OB History    No data available       Home Medications    Prior to Admission medications   Medication Sig Start Date End Date Taking? Authorizing Provider  escitalopram (LEXAPRO) 5 MG tablet Take 1 tablet (5 mg total) by mouth daily. 01/22/17   Holley Raring, MD  LORazepam (ATIVAN) 0.5 MG tablet Take 1 tablet (0.5 mg total) by mouth every 6 (six) hours as needed for anxiety. 01/22/17   Holley Raring, MD  ondansetron (ZOFRAN) 4 MG tablet Take 1 tablet (4 mg total) by mouth every 8 (eight) hours as needed for nausea or vomiting. 01/22/17   Holley Raring, MD  oxyCODONE (OXYCONTIN) 10 mg 12 hr tablet Take 1 tablet (10 mg total) by mouth every 12 (twelve) hours.  01/24/17   [name], MD  oxyCODONE (ROXICODONE) 5 MG immediate release tablet Take 1-3 tablets (5-15 mg total) by mouth every 4 (four) hours as needed for severe pain. 01/29/17   Virgel Manifold, MD    Family History History reviewed. No pertinent family history.  Social History Social History  Substance Use Topics  . Smoking status: Former Smoker    Types: Cigarettes    Quit date: 01/21/2017  . Smokeless tobacco: Never Used  . Alcohol use No     Allergies   Thorazine [chlorpromazine]   Review of Systems Review of Systems  Constitutional: Positive for appetite change. Negative for chills and fever.  HENT: Negative for ear pain and sore throat.   Eyes: Negative for pain and visual disturbance.  Respiratory: Negative for cough, chest tightness, shortness of breath, wheezing and stridor.   Cardiovascular: Positive for leg swelling. Negative for chest pain and palpitations.       Bilateral mild non-pitting edema around ankles  Gastrointestinal: Negative for abdominal distention, abdominal pain, blood in stool, diarrhea, nausea and vomiting.  Genitourinary: Negative for difficulty urinating, dysuria, flank pain, frequency and hematuria.  Musculoskeletal: Positive for back pain. Negative for arthralgias, neck pain and neck stiffness.  Skin: Negative for color change, pallor and rash.  Neurological: Negative for seizures, syncope, weakness  and numbness.  All other systems reviewed and are negative.    Physical Exam Updated Vital Signs BP 127/72 (BP Location: Right Wrist)   Pulse 70   Temp 98.8 F (37.1 C) (Oral)   Resp 16   Ht 5\' 5"  (1.651 m)   Wt 81.6 kg   SpO2 98%   BMI 29.95 kg/m   Physical Exam  Constitutional: She appears well-developed and well-nourished. No distress.  Patient is afebrile, nontoxic-appearing, sitting comfortably in bed in no acute distress.  HENT:  Head: Normocephalic and atraumatic.  Eyes: Conjunctivae are normal.  Neck: Neck supple.    Cardiovascular: Normal rate, regular rhythm, normal heart sounds and intact distal pulses.   No murmur heard. Pulmonary/Chest: Effort normal and breath sounds normal. No respiratory distress. She has no rales. She exhibits no tenderness.  Abdominal: Soft. There is no tenderness.  Musculoskeletal: Normal range of motion. She exhibits edema. She exhibits no tenderness or deformity.  Bilateral non-pitting mild edema both ankles  Neurological: She is alert. No sensory deficit.  Patient is neurovascularly intact in lower extremities bilaterally.  Skin: Skin is warm and dry. She is not diaphoretic.  Psychiatric: She has a normal mood and affect.  Nursing note and vitals reviewed.    ED Treatments / Results  Labs (all labs ordered are listed, but only abnormal results are displayed) Labs Reviewed  COMPREHENSIVE METABOLIC PANEL - Abnormal; Notable for the following:       Result Value   Sodium 133 (*)    Chloride 99 (*)    Glucose, Bld 101 (*)    Alkaline Phosphatase 127 (*)    All other components within normal limits  URINALYSIS, ROUTINE W REFLEX MICROSCOPIC - Abnormal; Notable for the following:    Color, Urine COLORLESS (*)    Specific Gravity, Urine 1.002 (*)    All other components within normal limits  CBC WITH DIFFERENTIAL/PLATELET    EKG  EKG Interpretation None       Radiology No results found.  Procedures Procedures (including critical care time)  Medications Ordered in ED Medications - No data to display   Initial Impression / Assessment and Plan / ED Course  I have reviewed the triage vital signs and the nursing notes.  Pertinent labs & imaging results that were available during my care of the patient were reviewed by me and considered in my medical decision making (see chart for details).     Patient presents with bilateral mild ankle edema nonpitting without any associated symptoms. She was mainly sedentary due to back pain until prescribed pain meds  for metastatic breast cancer diagnosed 3 days ago. She started to walk around a little bit more and noted some ankle swelling. On exam her lungs are clear and equal bilaterally with good air exchange. Neurovascularly intact bilaterally in both extremities. Calves are supple nontender and legs are nonswollen.  Wells criteria: 1.5 Patient came in as she was unsure if this was something needed to be addressed right away.  She has her first appointment with oncology tomorrow at noon for evaluation.  Patient will be taking her scheduled pain medication from home while in Ed. Pain was controlled while in ED. Obtained labs to evaluate for any change in kidney function or profound anemia. Labs were reviewed and unremarkable.  Will discharge home with close oncology follow up tomorrow. Patient was discussed with Dr. Hillard Danker who also has seen patient and agrees with assessment and plan. She was well appearing and ready  to go home prior to discharge. Discussed strict return precautions. Patient was advised to return to the emergency department if experiencing any new or worsening symptoms. Patient clearly understood instructions and agreed with discharge plan.  Final Clinical Impressions(s) / ED Diagnoses   Final diagnoses:  Pedal edema    New Prescriptions New Prescriptions   No medications on file     Dossie Der 01/31/17 [place], MD 02/01/17 [phone]

## 2017-01-31 NOTE — ED Triage Notes (Signed)
PT C/O BILATERAL FOOT SWELLING SINCE YESTERDAY.PT DENIES CHEST PAIN OR SOB.

## 2017-01-31 NOTE — ED Notes (Signed)
Per PA Janett Billow, patient can take home pain meds. Patient taking them now.

## 2017-01-31 NOTE — Discharge Instructions (Signed)
Follow up with your oncologist tomorrow as scheduled.  Return to the emergency department if you experience, c/p, shortness of breath, increased swelling or pain/swelling in your calf or any other concerning symptoms.

## 2017-02-01 ENCOUNTER — Ambulatory Visit (HOSPITAL_COMMUNITY)
Admission: RE | Admit: 2017-02-01 | Discharge: 2017-02-01 | Disposition: A | Discharge status: Home / Self Care | Payer: BC Managed Care – PPO | Source: Ambulatory Visit | Attending: Hematology | Admitting: Hematology

## 2017-02-01 ENCOUNTER — Encounter: Payer: Self-pay | Admitting: Hematology

## 2017-02-01 ENCOUNTER — Ambulatory Visit (HOSPITAL_BASED_OUTPATIENT_CLINIC_OR_DEPARTMENT_OTHER): Payer: BC Managed Care – PPO | Admitting: Hematology

## 2017-02-01 VITALS — BP 132/66 | HR 71 | Temp 98.3°F | Resp 18 | Wt 187.0 lb

## 2017-02-01 DIAGNOSIS — C7951 Secondary malignant neoplasm of bone: Secondary | ICD-10-CM | POA: Diagnosis not present

## 2017-02-01 DIAGNOSIS — C50912 Malignant neoplasm of unspecified site of left female breast: Secondary | ICD-10-CM

## 2017-02-01 DIAGNOSIS — C50919 Malignant neoplasm of unspecified site of unspecified female breast: Secondary | ICD-10-CM | POA: Diagnosis not present

## 2017-02-01 DIAGNOSIS — M7989 Other specified soft tissue disorders: Secondary | ICD-10-CM | POA: Diagnosis present

## 2017-02-01 DIAGNOSIS — C7800 Secondary malignant neoplasm of unspecified lung: Secondary | ICD-10-CM

## 2017-02-01 DIAGNOSIS — G893 Neoplasm related pain (acute) (chronic): Secondary | ICD-10-CM | POA: Diagnosis not present

## 2017-02-01 DIAGNOSIS — E559 Vitamin D deficiency, unspecified: Secondary | ICD-10-CM | POA: Diagnosis not present

## 2017-02-01 DIAGNOSIS — C773 Secondary and unspecified malignant neoplasm of axilla and upper limb lymph nodes: Secondary | ICD-10-CM | POA: Diagnosis not present

## 2017-02-01 MED ORDER — POLYETHYLENE GLYCOL 3350 17 G PO PACK: 17.0000 g | PACK | Freq: Every day | ORAL | 1 refills | Status: DC

## 2017-02-01 MED ORDER — DEXAMETHASONE 4 MG PO TABS: 4.0000 mg | ORAL_TABLET | Freq: Every day | ORAL | 0 refills | Status: DC

## 2017-02-01 MED ORDER — SENNOSIDES-DOCUSATE SODIUM 8.6-50 MG PO TABS: 2.0000 | ORAL_TABLET | Freq: Two times a day (BID) | ORAL | 1 refills | Status: DC

## 2017-02-01 MED ORDER — HYDROMORPHONE HCL 2 MG PO TABS: 2.0000 mg | ORAL_TABLET | ORAL | 0 refills | Status: DC | PRN

## 2017-02-01 MED ORDER — OXYCODONE HCL ER 20 MG PO T12A: 20.0000 mg | EXTENDED_RELEASE_TABLET | Freq: Two times a day (BID) | ORAL | 0 refills | Status: DC

## 2017-02-01 MED ORDER — ERGOCALCIFEROL 1.25 MG (50000 UT) PO CAPS: 50000.0000 [IU] | ORAL_CAPSULE | ORAL | 0 refills | Status: DC

## 2017-02-01 NOTE — Progress Notes (Addendum)
*  PRELIMINARY RESULTS* Vascular Ultrasound Bilateral lower extremity venous duplex has been completed.  Preliminary findings: No evidence of deep vein thrombosis in the visualized veins of the lower extremities.  Some slow flow noted bilaterally. Negative for baker's cysts.  Preliminary results called to  Dr. Grier Mitts office, given to [place] @ 15:00. Everrett Coombe 02/01/2017, 3:14 PM

## 2017-02-02 ENCOUNTER — Ambulatory Visit
Admission: RE | Admit: 2017-02-02 | Discharge: 2017-02-02 | Disposition: A | Discharge status: Home / Self Care | Payer: BC Managed Care – PPO | Source: Ambulatory Visit | Attending: Radiation Oncology | Admitting: Radiation Oncology

## 2017-02-02 ENCOUNTER — Inpatient Hospital Stay: Payer: BC Managed Care – PPO | Admitting: Hematology

## 2017-02-02 ENCOUNTER — Encounter: Payer: Self-pay | Admitting: Hematology

## 2017-02-02 ENCOUNTER — Encounter: Payer: Self-pay | Admitting: Radiation Oncology

## 2017-02-02 VITALS — BP 133/88 | HR 73 | Resp 18 | Ht 65.0 in | Wt 187.4 lb

## 2017-02-02 DIAGNOSIS — C50919 Malignant neoplasm of unspecified site of unspecified female breast: Secondary | ICD-10-CM

## 2017-02-02 DIAGNOSIS — C7951 Secondary malignant neoplasm of bone: Secondary | ICD-10-CM

## 2017-02-02 DIAGNOSIS — Z888 Allergy status to other drugs, medicaments and biological substances status: Secondary | ICD-10-CM | POA: Insufficient documentation

## 2017-02-02 DIAGNOSIS — Z803 Family history of malignant neoplasm of breast: Secondary | ICD-10-CM | POA: Insufficient documentation

## 2017-02-02 DIAGNOSIS — Z87891 Personal history of nicotine dependence: Secondary | ICD-10-CM | POA: Diagnosis not present

## 2017-02-02 DIAGNOSIS — C7952 Secondary malignant neoplasm of bone marrow: Principal | ICD-10-CM

## 2017-02-02 DIAGNOSIS — G893 Neoplasm related pain (acute) (chronic): Secondary | ICD-10-CM | POA: Insufficient documentation

## 2017-02-02 DIAGNOSIS — Z9049 Acquired absence of other specified parts of digestive tract: Secondary | ICD-10-CM | POA: Diagnosis not present

## 2017-02-02 NOTE — Progress Notes (Signed)
Received PA request for Oxycontin.  Attempted to complete via Cover My Meds and received a message unable to locate member.  Called [facility](Sara) to initiate PA over the phone. She asked 2 questions and state that it is approved for one year.    Called [facility](Barty) to advise of approval. He states they will go ahead and start working on it.

## 2017-02-02 NOTE — Progress Notes (Signed)
See progress note under physician encounter. 

## 2017-02-02 NOTE — Progress Notes (Signed)
Histology and Location of Primary Cancer: Newly diagnosed metastatic Left breast cancer with ulcerated left lower inner quadrant breast lesion with left axillary and subpectoral lymphadenopathy, significant osseous metastases, pulmonary metastases. Also with osseous skull metastases, including a large left occipital lobe lesion.  Location(s) of Symptomatic Metastases: low thoracic and lumbar spine  Past/Anticipated chemotherapy by medical oncology, if any: Consult by Kale on 01/21/17. Kale began staging work up.   Pain on a scale of 0-10 is: Reports intermittent low back pain worse in the evening that radiates into her pelvis. Reports taking oxycontin 20 mg every 12 hours and dilaudid 2 mg every 4 hours to manage back pain.    If Spine Met(s), symptoms, if any, include:  Bowel/Bladder retention or incontinence (please describe): No  Numbness or weakness in extremities (please describe): No  Current Decadron regimen, if applicable: Decadron 4 mg once daily. No evidence of thrush  Ambulatory status? Walker? Wheelchair?: Ambulatory  SAFETY ISSUES:  Prior radiation? no  Pacemaker/ICD? no  Possible current pregnancy? no  Is the patient on methotrexate? no  Current Complaints / other details:  61 year old female. Single. Lives alone. Mother and sister with history of breast ca. 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 Phobia of the medical system. Accompanied today by daughter and sister. Denies headache, dizziness, nausea, vomiting, diplopia, tinnitus, confusion or decline of short term memory. Report using Biotene for dry mouth. Reports ultrasound was done yesterday to rule out DVT in lower extremities. Non pitting edema note in ankles.   Scheduled for Barb Neff on 3/20, chemo education 3/20, and follow up with Kale on 3/26    

## 2017-02-02 NOTE — Progress Notes (Signed)
[place]         [phone] ________________________________  Initial outpatient Consultation  Name: Stacey Cox MRN: Cox  Date: 02/02/2017  DOB: 1956/06/25  REFERRING PHYSICIAN: [name], MD  DIAGNOSIS: The primary encounter diagnosis was Bone metastases ([place]). 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    ICD-9-CM ICD-10-CM   1. Bone metastases (HCC) 198.5 C79.51   2. Metastatic breast cancer (HCC) 174.9 C50.919   3. 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 female who was recently seen in ED on 01/21/2017 with c/o back pain having lasted for "a few" weeks. She first experienced pain while helping a friend move, believing it was muscle pain. She presented to ED after pain had worsened. Given concern for nephrolithiasis, pt had CT renal stone study revealing left breast ulceration with multiple metastases to the T and L spine and possible pulmonary metastases. Additional CT chest WO contrast on the same day revealed similar findings. Pt reported of no recent mammogram but did endorse hardening of left breast a few weeks prior to ED visit and brown colored discharge from left breast.  Pt underwent MRI of brain on 01/22/17 which revealed a 11 mm extra-axial mass in the posterior fossa favoring a small meningioma over metastasis. A 6 mm nodule was also discovered in the prepontine cistern, possibly an additional small meningioma. Postcontrast brain MRI is recommended for further evaluation. A large occipital lobe lesion was noted.  Pt presented to ED again on 01/28/2017 with c/o lower back and pelvic pain unmitigated by oxycodone and OxyContin. Pt then reported to ED once more on 01/31/2017 with c/o bilateral ankle swelling lasting 1 day. Pt had been lying in bed due to pain from spinal metastasis and attempted to walk intermittently and  pt believes this caused swelling. Pt was kept on her home pain management regimen and discharged with expectation of close f/u with oncology the following day.  Subsequently, the patient followed up with Dr. Irene Limbo on 02/01/17. Per Dr. Grier Mitts note, the patient will receive Xgeva injections every 4 weeks.  The patient presents to the clinic today to discuss the role that radiation therapy may play in the treatment of her disease. She is accompanied by family.  The patient reports intermittent low back pain, which is worse in the evenings, and radiates into her pelvis. She is taking OxyContin 20 mg every 12 hours and Dilaudid 10 mg every 4 hours to manage this pain. She denies incontinence, numbness or weakness in extremities. She is currently taking Decadron 4 mg once daily. The patient reports she has a follow up appointment with Dr. Irene Limbo on 02/14/17.  Of note, pt has a family hx with mother dx in 32s, sister dx in 15s-50s, but pt has not followed up on this with her PCP.  PREVIOUS RADIATION THERAPY: No  Past Medical History:  Diagnosis Date  . Cancer [facility])    Metastatic Breast Cancer  :   Past Surgical History:  Procedure Laterality Date  . CHOLECYSTECTOMY    . TONSILLECTOMY    :   Current Outpatient Prescriptions:  .  dexamethasone (DECADRON) 4 MG tablet, Take 1 tablet (4 mg total) by mouth daily. With breakfast, Disp: 30 tablet, Rfl: 0 .  ergocalciferol (VITAMIN D2) 50000 units capsule, Take 1 capsule (50,000 Units total) by mouth once a week., Disp: 12 capsule, Rfl: 0 .  escitalopram (LEXAPRO) 5 MG tablet, Take 1 tablet (5 mg total) by mouth daily., Disp: 30 tablet, Rfl: 0 .  HYDROmorphone (DILAUDID) 2 MG tablet, Take 1 tablet (2 mg total) by mouth every 4 (four) hours as needed for severe pain., Disp: 60 tablet, Rfl: 0 .  LORazepam (ATIVAN) 0.5 MG tablet, Take 1 tablet (0.5 mg total) by mouth every 6 (six) hours as needed for anxiety., Disp: 60 tablet, Rfl: 0 .  oxyCODONE (OXYCONTIN)  20 mg 12 hr tablet, Take 1 tablet (20 mg total) by mouth every 12 (twelve) hours., Disp: 60 tablet, Rfl: 0 .  polyethylene glycol (MIRALAX) packet, Take 17 g by mouth daily., Disp: 30 each, Rfl: 1 .  senna-docusate (SENNA S) 8.6-50 MG tablet, Take 2 tablets by mouth 2 (two) times daily., Disp: 60 tablet, Rfl: 1 .  ondansetron (ZOFRAN) 4 MG tablet, Take 1 tablet (4 mg total) by mouth every 8 (eight) hours as needed for nausea or vomiting. (Patient not taking: Reported on 02/02/2017), Disp: 20 tablet, Rfl: 0:   Allergies  Allergen Reactions  . Thorazine [Chlorpromazine] Anaphylaxis  :   Family History  Problem Relation Age of Onset  . Cancer Mother     breast  . Cancer Sister     breast  :   Social History   Social History  . Marital status: Single    Spouse name: N/A  . Number of children: N/A  . Years of education: N/A   Occupational History  . Not on file.   Social History Main Topics  . Smoking status: Former Smoker    Packs/day: 0.00    Types: Cigarettes    Quit date: 01/21/2017  . Smokeless tobacco: Never Used  . Alcohol use No  . Drug use: No  . Sexual activity: No   Other Topics Concern  . Not on file   Social History Narrative  . No narrative on file  :  REVIEW OF SYSTEMS:  A 15 point review of systems is documented in the electronic medical record. This was obtained by the nursing staff. However, I reviewed this with the patient to discuss relevant findings and make appropriate changes.  Pertinent items are noted in HPI.   PHYSICAL EXAM:  Blood pressure 133/88, pulse 73, resp. rate 18, height _0  (1.651 m), weight 187 lb 6.4 oz (85 kg), SpO2 100 %. In general this is a well appearing African American woman in no acute distress. She's alert and oriented x4 and appropriate throughout the examination. Cardiopulmonary assessment is negative for acute distress and she exhibits normal effort.    KPS = 70  100 - Normal; no complaints; no evidence of  disease. 90   - Able to carry on normal activity; minor signs or symptoms of disease. 80   - Normal activity with effort; some signs or symptoms of disease. 5   - Cares for self; unable to carry on normal activity or to do active work. 60   - Requires occasional assistance, but is able to care for most of his personal needs. 50   - Requires considerable assistance and frequent medical care. 64   - Disabled; requires special care and assistance. 90   - Severely disabled; hospital admission is indicated although death not imminent. 4   - Very sick; hospital admission necessary; active supportive treatment necessary. 10   - Moribund; fatal processes progressing rapidly. 0     - Dead  Karnofsky DA, Abelmann Reeder, Craver LS and Burchenal  [facility] (1948) The use of the nitrogen mustards in the palliative treatment of carcinoma: with particular reference to bronchogenic carcinoma Cancer 1 634-56  LABORATORY DATA:  Lab Results  Component Value Date   WBC 9.6 01/31/2017   HGB 12.5 01/31/2017   HCT 37.6 01/31/2017   MCV 85.3 01/31/2017   PLT 231 01/31/2017   Lab Results  Component Value Date   NA 133 (L) 01/31/2017   K 3.8 01/31/2017   CL 99 (L) 01/31/2017   CO2 25 01/31/2017   Lab Results  Component Value Date   ALT 46 01/31/2017   AST 35 01/31/2017   ALKPHOS 127 (H) 01/31/2017   BILITOT 0.6 01/31/2017     RADIOGRAPHY: Dg Cervical Spine 2 Or 3 Views  Result Date: 01/21/2017 CLINICAL DATA:  Findings of metastatic disease on recent CT examination with neck pain EXAM: CERVICAL SPINE - 3 VIEW COMPARISON:  None. FINDINGS: Six cervical segments are well visualized on this exam. The seventh cervical segment is only partially visualized. No compression deformities are seen. Facet hypertrophic changes are noted commenced with the patient's age. The odontoid is within normal limits. IMPRESSION: Degenerative change without sclerosis to suggest metastatic disease. If clinically indicated MRI or bone scan  would be much more sensitive for occult bony metastatic disease Electronically Signed   By: Inez Catalina M.D.   On: 01/21/2017 10:44   Dg Thoracic Spine 2 View  Result Date: 01/21/2017 CLINICAL DATA:  Abnormal CT EXAM: THORACIC SPINE 2 VIEWS COMPARISON:  CT from earlier in the same day FINDINGS: Pedicles are within normal limits. Vertebral body height is well maintained with the exception of T6 which demonstrates very mild compression deformity. This is of uncertain significance. MRI would be helpful for further evaluation as necessary. No other compression deformities are seen. Mild osteophytic changes are noted. The previously seen changes of T12 are not well appreciated on this exam. IMPRESSION: T6 compression deformity of a mild degree. This may be chronic in nature as no prior films are available for comparison. MRI may be helpful for further evaluation as clinically necessary. Electronically Signed   By: Inez Catalina M.D.   On: 01/21/2017 10:42   Dg Lumbar Spine 2-3 Views  Result Date: 01/21/2017 CLINICAL DATA:  Abnormal CT showing metastatic disease at L2 EXAM: LUMBAR SPINE - 2-3 VIEW COMPARISON:  None. FINDINGS: Vertebral body height is well maintained. Mild sclerosis is noted in the posterior inferior aspect of L2 corresponding to that seen on prior CT examination. Mild osteophytic changes are noted. IMPRESSION: L2 metastatic disease similar to that seen on recent CT. Electronically Signed   By: Inez Catalina M.D.   On: 01/21/2017 10:43   Ct Chest Wo Contrast  Result Date: 01/21/2017 CLINICAL DATA:  Metastatic breast cancer. Thoraco lumbar metastasis on bone scan. Right rib activity on bone scan. EXAM: CT CHEST WITHOUT CONTRAST TECHNIQUE: Multidetector CT imaging of the chest was performed following the standard protocol without IV contrast. COMPARISON:  Bone scan earlier this day. Included lung bases from CT abdomen/ pelvis earlier this day. FINDINGS: Cardiovascular: Mild atherosclerosis of the  thoracic aorta. The heart is normal in size. A few coronary artery calcifications are seen. Mediastinum/Nodes: 15 mm subcarinal node. 11 mm lower paratracheal node. 8 mm right upper paratracheal node. Small bilateral supraclavicular nodes. Multiple enlarged small left axillary lymph nodes measure up to 14 mm. There prominent left subpectoral nodes. Limited assessment for hilar adenopathy given lack of IV contrast, there is left hilar prominence. No  enlarged right axillary lymph nodes. The esophagus is decompressed. Visualized thyroid gland is normal. Lungs/Pleura: Multifocal pulmonary nodules consistent with metastatic disease. These involve all lobes of both lungs. For example paramediastinal right middle lobe nodule measures 10 mm, series 205 image 56 left upper lobe perifissural nodule measures 13 x 8 mm image 35. Central left upper lobe nodule versus adenopathy with surrounding spiculation. Upper Abdomen: Cholecystectomy clips. No adrenal nodule. Patient had CT abdomen/ pelvis earlier this day. Musculoskeletal: Irregular medial left breast lesion measures approximately 3 cm. There is diffuse left breast skin thickening and probable ulceration. Probable additional left breast nodule in the region of skin thickening. There is callus formation about right lateral eighth rib. Lucency within right T12 transverse process. There is equivocal diminished density T11, T10, and T9 right transverse processes. Minimal anterior compression of T6 with likely underlying lucent lesion. There is no evidence of epidural tumor extension. IMPRESSION: 1. Primary left breast cancer with left axillary and subpectoral adenopathy. 2. Metastatic disease in the thorax with multiple pulmonary nodules mild mediastinal adenopathy. Left hilar soft tissue prominence may be central pulmonary nodule or adenopathy. 3. Osseous metastatic disease with mild T6 compression fracture with lucent lesion anteriorly and lucencies in the posterior elements  of T9 through T12. Fracture of right anterior rib has surrounding callus is likely a pathologic fracture. Electronically Signed   By: Jeb Levering M.D.   On: 01/21/2017 21:55   Mr Brain Wo Contrast  Result Date: 01/22/2017 CLINICAL DATA:  Newly diagnosed metastatic left breast cancer. EXAM: MRI HEAD WITHOUT CONTRAST TECHNIQUE: Multiplanar, multiecho pulse sequences of the brain and surrounding structures were obtained without intravenous contrast. COMPARISON:  Nuclear medicine whole-body bone scan 01/21/2017 FINDINGS: The examination was ordered without IV contrast, and cerebral volume is within normal limits for age. The patient is severely claustrophobic. Brain: There is no evidence of acute infarct, intracranial hemorrhage, midline shift, or extra-axial fluid collection. A few small foci of T2 hyperintensity in the cerebral white matter are nonspecific and not greater than expected for patient's age. There is advanced cerebellar atrophy. Cerebral volume is within normal limits. There is a rounded 11 mm mass in the posterior fossa located along the anteroinferior margin of the right cerebellar hemisphere just posterior to the jugular bulb. This appears extra-axial in location and does not result in cerebellar mass effect. There is also a 6 mm nodular T2 hypointense extra-axial focus in the right aspect of the prepontine cistern just posterior to the right cavernous sinus. No definite cerebral mass lesions are identified, however sensitivity is reduced by the lack of IV contrast. There is no evidence of vasogenic edema. There is evidence of mild dural thickening versus a tiny subdural collection in the left occipital region measuring 2-3 mm in thickness without associated mass effect. Vascular: Major intracranial vascular flow voids are preserved. Skull and upper cervical spine: Large area of confluent abnormal bone marrow signal in the left occipital skull overlying the area of dural thickening/ tiny  collection described above and corresponding to the abnormal uptake on recent nuclear medicine bone scan. A smaller lesion is present in the right parietal skull. Sinuses/Orbits: Unremarkable orbits. Paranasal sinuses and mastoid air cells are clear. Other: None. IMPRESSION: 1. 11 mm extra-axial mass in the posterior fossa. Appearance favors a small meningioma over metastasis. 2. 6 mm nodule in the prepontine cistern, indeterminate. This could reflect an additional small meningioma. This is close to but not clearly contiguous with the right cavernous ICA to strongly suggest  aneurysm. Postcontrast brain MRI is recommended for further evaluation. 3. Osseous skull metastases, including a large left occipital lobe lesion. Underlying thin extra-axial signal abnormality is favored to reflect mild dural thickening (either reactive or due to tumor infiltration) though a tiny subdural fluid collection/hematoma is also possible. This can be further evaluated on postcontrast MRI. 4. No definite parenchymal brain metastases, however evaluation is limited by lack of IV contrast. 5. Advanced cerebellar atrophy. Electronically Signed   By: Logan Bores M.D.   On: 01/22/2017 09:23   Nm Bone Scan Whole Body  Result Date: 01/21/2017 CLINICAL DATA:  Metastatic breast carcinoma EXAM: NUCLEAR MEDICINE WHOLE BODY BONE SCAN TECHNIQUE: Whole body anterior and posterior images were obtained approximately 3 hours after intravenous injection of radiopharmaceutical. RADIOPHARMACEUTICALS:  22.3 mCi Technetium-32mMDP IV COMPARISON:  CT from earlier in the same day FINDINGS: Adequate uptake of radioactive tracer is noted throughout the bony skeleton. Bilateral renal activity is noted. There multifocal areas of increased activity identified within the thoracic and lumbar spine at what appear to be T8, T10, T11 and T12 as well as L2 and L3. Some of these changes correspond to those seen on recent CT examination. Additionally there is increased  activity in the lateral and anterior right rib cage which correspond to healing fractures in the right ribcage. Scattered degenerative changes are noted particularly in the feet bilaterally. Increased activity is noted in the left posterior calvarium highly suspicious for metastatic lesion. This was not well appreciated on recent plain film examination of the cervical spine. IMPRESSION: Changes consistent with multifocal metastatic disease throughout the lower thoracic and lumbar spine as described. Increased activity is noted within the right ribcage laterally consistent with healing fractures. These may be pathologic in nature. Calvarial metastatic disease is noted on the left posteriorly. Electronically Signed   By: MInez CatalinaM.D.   On: 01/21/2017 15:51   [place]Breast Ltd Uni Left Inc Axilla  Result Date: 01/28/2017 CLINICAL DATA:  Patient presents for evaluation of ulcerated left breast mass. EXAM: 2D DIGITAL DIAGNOSTIC BILATERAL MAMMOGRAM WITH CAD AND ADJUNCT TOMO ULTRASOUND BILATERAL BREAST COMPARISON:  None ACR Breast Density Category b: There are scattered areas of fibroglandular density. FINDINGS: Within the medial aspect of the left breast there is a 4.1 cm ulcerated mass. Slightly anterior to this within the medial left breast middle depth is a 2.3 cm oval lobular mass. Within the upper inner left breast there is a 1.4 cm mass with associated distortion. Within the slightly medial right breast middle depth there is a 0.8 cm oval circumscribed mass. Multiple prominent left axillary lymph nodes are demonstrated on mammography. Mammographic images were processed with CAD. On physical exam, there is an ulcerated mass within the lower inner left breast. Additionally there is a palpable mass within the periareolar left breast. Targeted ultrasound is performed, showing an ill-defined difficult to evaluate mass within the left breast 9 o'clock position 5 cm from the nipple with superficial ulceration. This  mass measures up to approximately 3 x 3 x 3.4 cm. Within the left breast 9 o'clock position 1 cm from the nipple there is a 3.7 x 2.9 x 1.6 cm irregular hypoechoic mass which appears to have invade the adjacent skin. Within left breast 10 o'clock position 3 cm from nipple there is a 1.2 x 1.0 x 1.4 cm irregular taller than wide mixed echogenicity mass. There are least 4 cortically thickened enlarged left axillary lymph nodes measuring up to 1 cm in thickness. Within the  right breast 2 o'clock position 5 cm from nipple there is a 5 x 3 x 6 mm simple cyst, corresponding with mammographically identified mass. IMPRESSION: Ulcerated mass within the medial left breast most compatible with primary breast malignancy. There are 2 additional suspicious masses within the left breast concerning for additional sites of disease. Multiple cortically thickened left axillary lymph nodes concerning for axillary metastasis. RECOMMENDATION: Ultrasound-guided core needle biopsy large ulcerated left breast mass. Ultrasound-guided core needle biopsy cortically thickened left axillary lymph node. Depending upon treatment plan, the patient may need biopsies of the additional suspicious left breast masses. I have discussed the findings and recommendations with the patient. Results were also provided in writing at the conclusion of the visit. If applicable, a reminder letter will be sent to the patient regarding the next appointment. BI-RADS CATEGORY  5: Highly suggestive of malignancy. Electronically Signed   By: Lovey Newcomer M.D.   On: 01/28/2017 13:17   US Breast Ltd Uni Right Inc Axilla  Result Date: 01/28/2017 CLINICAL DATA:  Patient presents for evaluation of ulcerated left breast mass. EXAM: 2D DIGITAL DIAGNOSTIC BILATERAL MAMMOGRAM WITH CAD AND ADJUNCT TOMO ULTRASOUND BILATERAL BREAST COMPARISON:  None ACR Breast Density Category b: There are scattered areas of fibroglandular density. FINDINGS: Within the medial aspect of the left  breast there is a 4.1 cm ulcerated mass. Slightly anterior to this within the medial left breast middle depth is a 2.3 cm oval lobular mass. Within the upper inner left breast there is a 1.4 cm mass with associated distortion. Within the slightly medial right breast middle depth there is a 0.8 cm oval circumscribed mass. Multiple prominent left axillary lymph nodes are demonstrated on mammography. Mammographic images were processed with CAD. On physical exam, there is an ulcerated mass within the lower inner left breast. Additionally there is a palpable mass within the periareolar left breast. Targeted ultrasound is performed, showing an ill-defined difficult to evaluate mass within the left breast 9 o'clock position 5 cm from the nipple with superficial ulceration. This mass measures up to approximately 3 x 3 x 3.4 cm. Within the left breast 9 o'clock position 1 cm from the nipple there is a 3.7 x 2.9 x 1.6 cm irregular hypoechoic mass which appears to have invade the adjacent skin. Within left breast 10 o'clock position 3 cm from nipple there is a 1.2 x 1.0 x 1.4 cm irregular taller than wide mixed echogenicity mass. There are least 4 cortically thickened enlarged left axillary lymph nodes measuring up to 1 cm in thickness. Within the right breast 2 o'clock position 5 cm from nipple there is a 5 x 3 x 6 mm simple cyst, corresponding with mammographically identified mass. IMPRESSION: Ulcerated mass within the medial left breast most compatible with primary breast malignancy. There are 2 additional suspicious masses within the left breast concerning for additional sites of disease. Multiple cortically thickened left axillary lymph nodes concerning for axillary metastasis. RECOMMENDATION: Ultrasound-guided core needle biopsy large ulcerated left breast mass. Ultrasound-guided core needle biopsy cortically thickened left axillary lymph node. Depending upon treatment plan, the patient may need biopsies of the  additional suspicious left breast masses. I have discussed the findings and recommendations with the patient. Results were also provided in writing at the conclusion of the visit. If applicable, a reminder letter will be sent to the patient regarding the next appointment. BI-RADS CATEGORY  5: Highly suggestive of malignancy. Electronically Signed   By: Lovey Newcomer M.D.   On: 01/28/2017 13:17  Mm Diag Breast Tomo Bilateral  Result Date: 01/28/2017 CLINICAL DATA:  Patient presents for evaluation of ulcerated left breast mass. EXAM: 2D DIGITAL DIAGNOSTIC BILATERAL MAMMOGRAM WITH CAD AND ADJUNCT TOMO ULTRASOUND BILATERAL BREAST COMPARISON:  None ACR Breast Density Category b: There are scattered areas of fibroglandular density. FINDINGS: Within the medial aspect of the left breast there is a 4.1 cm ulcerated mass. Slightly anterior to this within the medial left breast middle depth is a 2.3 cm oval lobular mass. Within the upper inner left breast there is a 1.4 cm mass with associated distortion. Within the slightly medial right breast middle depth there is a 0.8 cm oval circumscribed mass. Multiple prominent left axillary lymph nodes are demonstrated on mammography. Mammographic images were processed with CAD. On physical exam, there is an ulcerated mass within the lower inner left breast. Additionally there is a palpable mass within the periareolar left breast. Targeted ultrasound is performed, showing an ill-defined difficult to evaluate mass within the left breast 9 o'clock position 5 cm from the nipple with superficial ulceration. This mass measures up to approximately 3 x 3 x 3.4 cm. Within the left breast 9 o'clock position 1 cm from the nipple there is a 3.7 x 2.9 x 1.6 cm irregular hypoechoic mass which appears to have invade the adjacent skin. Within left breast 10 o'clock position 3 cm from nipple there is a 1.2 x 1.0 x 1.4 cm irregular taller than wide mixed echogenicity mass. There are least 4  cortically thickened enlarged left axillary lymph nodes measuring up to 1 cm in thickness. Within the right breast 2 o'clock position 5 cm from nipple there is a 5 x 3 x 6 mm simple cyst, corresponding with mammographically identified mass. IMPRESSION: Ulcerated mass within the medial left breast most compatible with primary breast malignancy. There are 2 additional suspicious masses within the left breast concerning for additional sites of disease. Multiple cortically thickened left axillary lymph nodes concerning for axillary metastasis. RECOMMENDATION: Ultrasound-guided core needle biopsy large ulcerated left breast mass. Ultrasound-guided core needle biopsy cortically thickened left axillary lymph node. Depending upon treatment plan, the patient may need biopsies of the additional suspicious left breast masses. I have discussed the findings and recommendations with the patient. Results were also provided in writing at the conclusion of the visit. If applicable, a reminder letter will be sent to the patient regarding the next appointment. BI-RADS CATEGORY  5: Highly suggestive of malignancy. Electronically Signed   By: Lovey Newcomer M.D.   On: 01/28/2017 13:17   [place] Axillary Node Core Biopsy Left  Result Date: 01/28/2017 CLINICAL DATA:  Patient with enlarged left axillary lymph node. EXAM: ULTRASOUND GUIDED CORE NEEDLE BIOPSY OF A LEFT AXILLARY NODE COMPARISON:  Previous exam(s). FINDINGS: I met with the patient and we discussed the procedure of ultrasound-guided biopsy, including benefits and alternatives. We discussed the high likelihood of a successful procedure. We discussed the risks of the procedure, including infection, bleeding, tissue injury, clip migration, and inadequate sampling. Informed written consent was given. The usual time-out protocol was performed immediately prior to the procedure. Using sterile technique and 1% Lidocaine as local anesthetic, under direct ultrasound visualization, a 14 gauge  spring-loaded device was used to perform biopsy of cortically thickened left axillary lymph node using a lateral approach. At the conclusion of the procedure a HydroMARK tissue marker clip was deployed into the biopsy cavity. Follow up 2 view mammogram was performed and dictated separately. IMPRESSION: Ultrasound guided biopsy of a cortically  thickened left axillary lymph node. No apparent complications. Electronically Signed   By: Lovey Newcomer M.D.   On: 01/28/2017 12:15   Ct Renal Stone Study  Result Date: 01/21/2017 CLINICAL DATA:  Low back and pelvic pain for 1 week. History of cholecystectomy. EXAM: CT ABDOMEN AND PELVIS WITHOUT CONTRAST TECHNIQUE: Multidetector CT imaging of the abdomen and pelvis was performed following the standard protocol without IV contrast. COMPARISON:  None. FINDINGS: LOWER CHEST: LEFT breast skin thickening and possible ulceration. 4 mm RIGHT lower lobe, 5 mm RIGHT lower lobe subpleural nodule and 9 mm RIGHT middle lobe pulmonary nodule (series 6, image 1/29). Heart size is normal. HEPATOBILIARY: Status post cholecystectomy.  Normal liver. PANCREAS: Normal. SPLEEN: Normal. ADRENALS/URINARY TRACT: Kidneys are orthotopic, demonstrating normal size and morphology. No nephrolithiasis, hydronephrosis; limited assessment for renal masses on this nonenhanced examination. The unopacified ureters are normal in course and caliber. Urinary bladder is partially distended and unremarkable. Normal adrenal glands. STOMACH/BOWEL: Very small hiatal hernia. The stomach, small and large bowel are normal in course and caliber without inflammatory changes, sensitivity decreased by lack of enteric contrast. Moderate sigmoid, mild generalized colon diverticulosis. Normal appendix. VASCULAR/LYMPHATIC: Aortoiliac vessels are normal in course and caliber, trace calcific atherosclerosis. No lymphadenopathy by CT size criteria. REPRODUCTIVE: Normal. OTHER: No intraperitoneal free fluid or free air.  MUSCULOSKELETAL: Ill-defined sclerotic lesion L2 without pathologic fracture. Mild sclerosis S1. Mildly expansile lesion LEFT L3 vertebral body with suspected pathologic fracture. Nondisplaced probable pathologic fracture RIGHT T12 transverse process. Moderate sacroiliac osteoarthrosis. IMPRESSION: No acute intra-abdominal or pelvic process. LEFT breast skin thickening and probable ulceration, recommend direct inspection. Multiple metastasis in the upper thoracic and lumbar spine included LEFT L3 transverse process and RIGHT T12 transverse pathologic fractures. Multiple pulmonary nodules measuring to 9 mm. Considering above findings, these are suspicious for metastasis. Acute findings discussed with and reconfirmed by PA NICOLE PISCIOTTA on 01/21/2017 at 3:29 am. Electronically Signed   By: Elon Alas M.D.   On: 01/21/2017 03:30   Mm Clip Placement Left  Result Date: 01/28/2017 CLINICAL DATA:  Patient status post ultrasound-guided biopsy left breast mass and left axillary lymph node. EXAM: DIAGNOSTIC LEFT MAMMOGRAM POST ULTRASOUND BIOPSY COMPARISON:  Previous exam(s). FINDINGS: Mammographic images were obtained following ultrasound guided biopsy of left breast mass and left axillary lymph node. Ribbon shaped marking clip appropriately located within the left breast mass. HydroMARK clip within the left axillary lymph node. IMPRESSION: Appropriate position biopsy marking clip status post ultrasound-guided biopsy left breast mass and left axillary lymph node. Final Assessment: Post Procedure Mammograms for Marker Placement Electronically Signed   By: Lovey Newcomer M.D.   On: 01/28/2017 12:15   [place] Lt Breast Bx W Loc Dev 1st Lesion Img Bx Spec US Guide  Result Date: 01/28/2017 CLINICAL DATA:  Patient with ulcerated left breast mass for percutaneous biopsy. EXAM: ULTRASOUND GUIDED LEFT BREAST CORE NEEDLE BIOPSY COMPARISON:  Previous exam(s). FINDINGS: I met with the patient and we discussed the procedure of  ultrasound-guided biopsy, including benefits and alternatives. We discussed the high likelihood of a successful procedure. We discussed the risks of the procedure, including infection, bleeding, tissue injury, clip migration, and inadequate sampling. Informed written consent was given. The usual time-out protocol was performed immediately prior to the procedure. Using sterile technique and 1% Lidocaine as local anesthetic, under direct ultrasound visualization, a 12 gauge spring-loaded device was used to perform biopsy of left breast mass 9 o'clock position using a medial approach. At the conclusion of the  procedure a ribbon shaped tissue marker clip was deployed into the biopsy cavity. Follow up 2 view mammogram was performed and dictated separately. IMPRESSION: Ultrasound guided biopsy of left breast mass 9 o'clock position. No apparent complications. Electronically Signed   By: Lovey Newcomer M.D.   On: 01/28/2017 12:13      IMPRESSION: Stacey Cox is a 61 y.o. 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  PLAN: Today, I talked to the patient and family about the findings and work-up thus far.  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  We discussed the available radiation techniques, and focused on the details of logistics and delivery.  We reviewed the anticipated acute and late sequelae associated with radiation in this setting.  The patient was encouraged to ask questions that I answered to the best of my ability. The patient would like to proceed with radiation to the lumbar spine and sacrum and is scheduled for CT simulation tomorrow 02/03/17 at 3pm.  I advised the patient she could take Dilaudid 15 minutes prior to [place] as needed.  I spent 60 minutes face to face with the patient and more than 50% of that time was spent in  counseling and/or coordination of care.   ------------------------------------------------   Tyler Pita, MD [facility] Director and Director of Stereotactic Radiosurgery Direct Dial: [phone]  Fax: [phone] [email or web address]  Skype  [email or web address]    This document serves as a record of services personally performed by Tyler Pita, MD. It was created on his behalf by Maryla Morrow, a trained medical scribe. The creation of this record is based on the scribe's personal observations and the provider's statements to them. This document has been checked and approved by the attending provider.

## 2017-02-03 ENCOUNTER — Ambulatory Visit
Admission: RE | Admit: 2017-02-03 | Discharge: 2017-02-03 | Disposition: A | Discharge status: Home / Self Care | Payer: BC Managed Care – PPO | Source: Ambulatory Visit | Attending: Radiation Oncology | Admitting: Radiation Oncology

## 2017-02-03 ENCOUNTER — Other Ambulatory Visit: Payer: Self-pay | Admitting: Hematology

## 2017-02-03 ENCOUNTER — Encounter: Payer: Self-pay | Admitting: Hematology

## 2017-02-03 DIAGNOSIS — C7951 Secondary malignant neoplasm of bone: Secondary | ICD-10-CM | POA: Diagnosis not present

## 2017-02-03 NOTE — Progress Notes (Signed)
Received faxed approval from [facility] for Oxycodone. Approval dates 02/02/17-02/02/18.

## 2017-02-04 DIAGNOSIS — C7951 Secondary malignant neoplasm of bone: Secondary | ICD-10-CM | POA: Diagnosis not present

## 2017-02-05 ENCOUNTER — Encounter (HOSPITAL_COMMUNITY): Payer: Self-pay

## 2017-02-05 ENCOUNTER — Emergency Department (HOSPITAL_COMMUNITY)
Admission: EM | Admit: 2017-02-05 | Discharge: 2017-02-05 | Disposition: A | Discharge status: Home / Self Care | Payer: BC Managed Care – PPO | Attending: Emergency Medicine | Admitting: Emergency Medicine

## 2017-02-05 ENCOUNTER — Other Ambulatory Visit: Payer: Self-pay | Admitting: Hematology

## 2017-02-05 DIAGNOSIS — C50919 Malignant neoplasm of unspecified site of unspecified female breast: Secondary | ICD-10-CM | POA: Diagnosis not present

## 2017-02-05 DIAGNOSIS — Z515 Encounter for palliative care: Secondary | ICD-10-CM | POA: Insufficient documentation

## 2017-02-05 DIAGNOSIS — C50912 Malignant neoplasm of unspecified site of left female breast: Secondary | ICD-10-CM | POA: Diagnosis not present

## 2017-02-05 DIAGNOSIS — Z87891 Personal history of nicotine dependence: Secondary | ICD-10-CM | POA: Diagnosis not present

## 2017-02-05 DIAGNOSIS — G893 Neoplasm related pain (acute) (chronic): Secondary | ICD-10-CM | POA: Diagnosis present

## 2017-02-05 MED ORDER — HYDROMORPHONE HCL 2 MG PO TABS
2.0000 mg | ORAL_TABLET | ORAL | Status: DC | PRN
  Administered 2017-02-05: 2 mg via ORAL
  Filled 2017-02-05: qty 1

## 2017-02-05 MED ORDER — ESCITALOPRAM OXALATE 10 MG PO TABS
5.0000 mg | ORAL_TABLET | Freq: Every day | ORAL | Status: DC
  Administered 2017-02-05: 5 mg via ORAL
  Filled 2017-02-05: qty 1

## 2017-02-05 MED ORDER — DEXAMETHASONE 4 MG PO TABS
4.0000 mg | ORAL_TABLET | Freq: Every day | ORAL | Status: DC
  Administered 2017-02-05: 4 mg via ORAL
  Filled 2017-02-05: qty 1

## 2017-02-05 MED ORDER — HYDROMORPHONE HCL 1 MG/ML IJ SOLN
1.0000 mg | Freq: Once | INTRAMUSCULAR | Status: AC
  Administered 2017-02-05: 1 mg via INTRAVENOUS
  Filled 2017-02-05: qty 1

## 2017-02-05 MED ORDER — LORAZEPAM 2 MG/ML IJ SOLN
1.0000 mg | Freq: Once | INTRAMUSCULAR | Status: AC
  Administered 2017-02-05: 1 mg via INTRAVENOUS
  Filled 2017-02-05: qty 1

## 2017-02-05 MED ORDER — RIBOCICLIB & LETROZOLE 200 & 2.5 MG PO TBPK: 1.0000 | ORAL_TABLET | ORAL | 2 refills | Status: DC

## 2017-02-05 MED ORDER — HYDROMORPHONE HCL 2 MG PO TABS
4.0000 mg | ORAL_TABLET | Freq: Once | ORAL | Status: AC
  Administered 2017-02-05: 4 mg via ORAL
  Filled 2017-02-05: qty 2

## 2017-02-05 MED ORDER — KETOROLAC TROMETHAMINE 15 MG/ML IJ SOLN
15.0000 mg | Freq: Once | INTRAMUSCULAR | Status: AC
  Administered 2017-02-05: 15 mg via INTRAVENOUS
  Filled 2017-02-05: qty 1

## 2017-02-05 NOTE — Progress Notes (Signed)
I met with Stacey Cox in the ED to discuss her pain regimen.  On discharge I would recommend:  1) Continue Oxycontin 20mg twice daily 2) Increase dilaudid to 4mg every 2-3 hours as needed.  We discussed sleepiness as potential side effect as well as not combining opioids with her ativan.  Keep a pain log of exacerbating activities, pain rating, and time of medication usage to review with provider at appointment on Monday to further titrate medications. 3) Increase dexamethasone to 4mg twice daily (am dose and dose at least 4 hours prior to bedtime. 4) Sennakot 3 tabs twice daily 5) Miralax twice daily until good bowel movement, followed by miralax once daily as needed.     Please call if there are any questions regarding this: [phone]  Full consult to follow.  Gene Freeman, MD [facility] [phone]  

## 2017-02-05 NOTE — ED Triage Notes (Signed)
Pt c/o lower back pain that radiates around to her hips. Hx of metastatic breast cancer. Pt has her first radiation treatment scheduled for Monday.

## 2017-02-05 NOTE — ED Provider Notes (Signed)
Patient was seen for palliative care consult by the palliative care service provider, Dr. Domingo Cocking.  He outlined the plan for the patient to increase her narcotic usage, control bony pain with Decadron, and prevent constipation with MiraLAX and Senokot.  Dr. Domingo Cocking will call Dr. Tammi Klippel, to help get some ongoing care initiated to continue to control her symptoms.   Daleen Bo, MD 02/05/17 1100

## 2017-02-05 NOTE — Discharge Instructions (Signed)
The plan arranged by Dr. Domingo Cocking is for you, includes taking your Dilaudid, in a higher dose and more frequently.   1) Continue Oxycontin 20mg  twice daily 2) Increase dilaudid to 4mg  every 2-3 hours as needed.  We discussed sleepiness as potential side effect as well as not combining opioids with your Ativan.  Keep a pain log of exacerbating activities, pain rating, and time of medication usage to review with provider at appointment on Monday to further titrate medications. 3) Increase dexamethasone to 4mg  twice daily (morning dose and dose at least 4 hours prior to bedtime. 4) Sennakot 3 tabs twice daily 5) Miralax twice daily until good bowel movement, followed by miralax once daily as needed.  Return here, if needed, for problems.     Please call if there are any questions regarding this: [phone]

## 2017-02-05 NOTE — ED Notes (Addendum)
Pt took 2 mg Dilaudid at 1045 pm, 0.5 mg Ativan at 1200 am, and 20 mg Oxycontin at 1230 am.

## 2017-02-06 NOTE — Consult Note (Signed)
Consultation Note Date: 02/06/2017   Patient Name: Stacey Cox  DOB: [birth year]  MRN: [id number]  Age / Sex: 61 y.o., female  PCP: No Pcp Per Patient Referring Physician: No att. providers found  Reason for Consultation: Pain control  HPI/Patient Profile: 61 y.o. female  with past medical history of Metastatic left breast cancer with subpectoral lymphadenopathy, significant osseous metastasis, and pulmonary metastasis seen and examined in ED on 02/05/2017 with worsening back pain related to her metastatic cancer.   She has been seen in the ED in the past for uncontrolled pain and plan is for her to pursue radiation therapy with by Dr. Tammi Klippel to begin next week.  She reports her pain is sharp in nature and begins in the lower back.  It radiates into her pelvis. It worsens in the evenings but without any particular exacerbating factor that she is able to recognize. Her current pain regimen is OxyContin 20 mg twice daily as well as Dilaudid 2 mg every 4 hours as needed. She states whenever she takes the Dilaudid it may help reduce her pain very slightly but she is also experiencing terminal dose failure as the pain returns 3 hours after taking medication. She is currently taking Decadron 4 mg once daily as well. She does have follow-up appointment with Dr. Tammi Klippel on Monday and Dr. Claiborne Billings on 3/26.  On arrival to ED she rated it as a 10 out of 10. After receiving 1 mg of IV Dilaudid she rates her pain as 2-3 out of 10.  No numbness, incontinence, or saddle anesthesia.  When I asked what she thinks may be causing her pain to be worse, she reports that she is also been constipated. She has been taking senna 2 tabs twice daily but is been several days since she had a good bowel movement.  SUMMARY OF RECOMMENDATIONS   For pain: 1) Continue Oxycontin 20mg  twice daily 2) Increase dilaudid to 4mg  every 2-3 hours as needed  while working to determine her daily opioid need.  She will keep a pain log of exacerbating activities, pain rating, and time of medication usage to review with provider at appointment on Monday to further titrate medications.  Once it is determined what her 24 hour opioid need will be to control her pain, I would recommend increasing her long-acting agent and trying to space her rescue medication frequency to every 3 hours as needed.  Her overall opioid needs will need to continue to be titrated over the next few weeks as her pain hopefully decreases with radiation therapy.  We discussed sleepiness as potential side effect as well as not combining opioids with her ativan. 3) Increase dexamethasone to 4mg  twice daily (am dose and dose at least 4 hours prior to bedtime.  For constipation: 4) Sennakot 3 tabs twice daily 5) Miralax twice daily until good bowel movement, followed by miralax once daily as needed.       Primary Diagnoses: Present on Admission: **None**   I have reviewed the medical record, interviewed the patient  and family, and examined the patient. The following aspects are pertinent.  Past Medical History:  Diagnosis Date  . Cancer [facility])    Metastatic Breast Cancer   Social History   Social History  . Marital status: Single    Spouse name: N/A  . Number of children: N/A  . Years of education: N/A   Social History Main Topics  . Smoking status: Former Smoker    Packs/day: 0.00    Types: Cigarettes    Quit date: 01/21/2017  . Smokeless tobacco: Never Used  . Alcohol use No  . Drug use: No  . Sexual activity: No   Other Topics Concern  . None   Social History Narrative  . None   Family History  Problem Relation Age of Onset  . Cancer Mother     breast  . Cancer Sister     breast   Scheduled Meds: Continuous Infusions: PRN Meds:. Medications Prior to Admission:  Prior to Admission medications   Medication Sig Start Date End Date Taking? Authorizing  Provider  dexamethasone (DECADRON) 4 MG tablet Take 1 tablet (4 mg total) by mouth daily. With breakfast 02/01/17  Yes Gautam Juleen China, MD  ergocalciferol (VITAMIN D2) 50000 units capsule Take 1 capsule (50,000 Units total) by mouth once a week. 02/01/17  Yes [name], MD  escitalopram (LEXAPRO) 5 MG tablet Take 1 tablet (5 mg total) by mouth daily. 01/22/17  Yes Holley Raring, MD  HYDROmorphone (DILAUDID) 2 MG tablet Take 1 tablet (2 mg total) by mouth every 4 (four) hours as needed for severe pain. 02/01/17  Yes Gautam Juleen China, MD  LORazepam (ATIVAN) 0.5 MG tablet Take 1 tablet (0.5 mg total) by mouth every 6 (six) hours as needed for anxiety. 01/22/17  Yes Holley Raring, MD  ondansetron (ZOFRAN) 4 MG tablet Take 1 tablet (4 mg total) by mouth every 8 (eight) hours as needed for nausea or vomiting. 01/22/17  Yes Holley Raring, MD  oxyCODONE (OXYCONTIN) 20 mg 12 hr tablet Take 1 tablet (20 mg total) by mouth every 12 (twelve) hours. 02/01/17  Yes [name], MD  senna-docusate (SENNA S) 8.6-50 MG tablet Take 2 tablets by mouth 2 (two) times daily. 02/01/17  Yes [name], MD  polyethylene glycol [facility]) packet Take 17 g by mouth daily. Patient not taking: Reported on 02/05/2017 02/01/17   [name], MD  Ribociclib & Letrozole [facility] FEMARA 600 DOSE) 200 & 2.5 MG TBPK Take 1 each by mouth See admin instructions. Ribociclib 600mg  3weeks on 1 week off. Letrozole 2.5mg  po daily without interruption. 02/05/17 03/07/17  [name], MD   Allergies  Allergen Reactions  . Thorazine [Chlorpromazine] Anaphylaxis   Review of Systems  Constitutional: Positive for activity change and fatigue.  Gastrointestinal: Positive for constipation.  Musculoskeletal: Positive for back pain and neck pain.  Psychiatric/Behavioral: Positive for sleep disturbance.    Physical Exam General: Alert, awake, in no acute distress. Lying in bed. HEENT: No bruits, no goiter, no  JVD Heart: Regular rate and rhythm. No murmur appreciated. Lungs: Good air movement, clear Abdomen: Soft, nontender, nondistended, positive bowel sounds.  Ext: No significant edema Skin: Warm and dry Neuro: Grossly intact, nonfocal.  Vital Signs: BP 123/82   Pulse (!) 49   Temp 98.9 F (37.2 C) (Oral)   Resp 16   Ht 5\' 5"  (1.651 m)   Wt 84.8 kg (187 lb)   SpO2 90%   BMI 31.12 kg/m  Pain Assessment: 0-10   Pain Score: 2    SpO2: SpO2: 90 % O2 Device:SpO2: 90 % O2 Flow Rate: .   IO: Intake/output summary: No intake or output data in the 24 hours ending 02/06/17 1949  LBM:   Baseline Weight: Weight: 84.8 kg (187 lb) Most recent weight: Weight: 84.8 kg (187 lb)     Palliative Assessment/Data:     Time In: 0830 Time Out: 0935 Time Total: 65 Greater than 50%  of this time was spent counseling and coordinating care related to the above assessment and plan.  Signed by: Micheline Rough, MD   Please contact Palliative Medicine Team phone at [phone] for questions and concerns.  For individual provider: See Shea Evans

## 2017-02-07 ENCOUNTER — Encounter (HOSPITAL_COMMUNITY): Payer: Self-pay | Admitting: Emergency Medicine

## 2017-02-07 ENCOUNTER — Other Ambulatory Visit: Payer: Self-pay | Admitting: Internal Medicine

## 2017-02-07 ENCOUNTER — Ambulatory Visit (HOSPITAL_BASED_OUTPATIENT_CLINIC_OR_DEPARTMENT_OTHER): Payer: BC Managed Care – PPO

## 2017-02-07 ENCOUNTER — Other Ambulatory Visit: Payer: Self-pay | Admitting: *Deleted

## 2017-02-07 ENCOUNTER — Ambulatory Visit
Admission: RE | Admit: 2017-02-07 | Discharge: 2017-02-07 | Disposition: A | Discharge status: Home / Self Care | Payer: BC Managed Care – PPO | Source: Ambulatory Visit | Attending: Radiation Oncology | Admitting: Radiation Oncology

## 2017-02-07 ENCOUNTER — Emergency Department (HOSPITAL_COMMUNITY)
Admission: EM | Admit: 2017-02-07 | Discharge: 2017-02-07 | Disposition: A | Discharge status: Home / Self Care | Payer: BC Managed Care – PPO | Attending: Emergency Medicine | Admitting: Emergency Medicine

## 2017-02-07 ENCOUNTER — Other Ambulatory Visit (HOSPITAL_BASED_OUTPATIENT_CLINIC_OR_DEPARTMENT_OTHER): Payer: BC Managed Care – PPO

## 2017-02-07 VITALS — BP 144/75 | HR 53 | Temp 98.8°F | Resp 16

## 2017-02-07 DIAGNOSIS — C7951 Secondary malignant neoplasm of bone: Secondary | ICD-10-CM | POA: Diagnosis not present

## 2017-02-07 DIAGNOSIS — Z853 Personal history of malignant neoplasm of breast: Secondary | ICD-10-CM | POA: Diagnosis not present

## 2017-02-07 DIAGNOSIS — C50912 Malignant neoplasm of unspecified site of left female breast: Secondary | ICD-10-CM

## 2017-02-07 DIAGNOSIS — C50919 Malignant neoplasm of unspecified site of unspecified female breast: Secondary | ICD-10-CM

## 2017-02-07 DIAGNOSIS — Z87891 Personal history of nicotine dependence: Secondary | ICD-10-CM | POA: Insufficient documentation

## 2017-02-07 DIAGNOSIS — Z760 Encounter for issue of repeat prescription: Secondary | ICD-10-CM | POA: Diagnosis not present

## 2017-02-07 DIAGNOSIS — G893 Neoplasm related pain (acute) (chronic): Secondary | ICD-10-CM | POA: Insufficient documentation

## 2017-02-07 LAB — COMPREHENSIVE METABOLIC PANEL
ALT: 42 U/L (ref 0–55)
ANION GAP: 10 meq/L (ref 3–11)
AST: 29 U/L (ref 5–34)
Albumin: 3.7 g/dL (ref 3.5–5.0)
Alkaline Phosphatase: 135 U/L (ref 40–150)
BILIRUBIN TOTAL: 0.37 mg/dL (ref 0.20–1.20)
BUN: 17.3 mg/dL (ref 7.0–26.0)
CO2: 24 meq/L (ref 22–29)
Calcium: 9.5 mg/dL (ref 8.4–10.4)
Chloride: 105 mEq/L (ref 98–109)
Creatinine: 0.9 mg/dL (ref 0.6–1.1)
EGFR: 76 mL/min/{1.73_m2} — AB (ref >90)
Glucose: 145 mg/dl — ABNORMAL HIGH (ref 70–140)
POTASSIUM: 3.7 meq/L (ref 3.5–5.1)
Sodium: 139 mEq/L (ref 136–145)
TOTAL PROTEIN: 7.2 g/dL (ref 6.4–8.3)

## 2017-02-07 LAB — CBC & DIFF AND RETIC
BASO%: 0.2 % (ref 0.0–2.0)
Basophils Absolute: 0 10*3/uL (ref 0.0–0.1)
EOS%: 0.2 % (ref 0.0–7.0)
Eosinophils Absolute: 0 10*3/uL (ref 0.0–0.5)
HCT: 39.5 % (ref 34.8–46.6)
HGB: 13.3 g/dL (ref 11.6–15.9)
Immature Retic Fract: 8.6 % (ref 1.60–10.00)
LYMPH%: 15.6 % (ref 14.0–49.7)
MCH: 28.5 pg (ref 25.1–34.0)
MCHC: 33.7 g/dL (ref 31.5–36.0)
MCV: 84.8 fL (ref 79.5–101.0)
MONO#: 0.6 10*3/uL (ref 0.1–0.9)
MONO%: 4.6 % (ref 0.0–14.0)
NEUT%: 79.4 % — ABNORMAL HIGH (ref 38.4–76.8)
NEUTROS ABS: 9.4 10*3/uL — AB (ref 1.5–6.5)
Platelets: 300 10*3/uL (ref 145–400)
RBC: 4.66 10*6/uL (ref 3.70–5.45)
RDW: 14.7 % — AB (ref 11.2–14.5)
Retic %: 2.04 % (ref 0.70–2.10)
Retic Ct Abs: 95.06 10*3/uL — ABNORMAL HIGH (ref 33.70–90.70)
WBC: 11.9 10*3/uL — AB (ref 3.9–10.3)
lymph#: 1.9 10*3/uL (ref 0.9–3.3)
nRBC: 0 % (ref 0–0)

## 2017-02-07 MED ORDER — HYDROMORPHONE HCL 2 MG PO TABS: 4.0000 mg | ORAL_TABLET | ORAL | 0 refills | Status: DC | PRN

## 2017-02-07 MED ORDER — HYDROMORPHONE HCL 2 MG PO TABS: 2.0000 mg | ORAL_TABLET | ORAL | 0 refills | Status: DC | PRN

## 2017-02-07 MED ORDER — DENOSUMAB 120 MG/1.7ML ~~LOC~~ SOLN
120.0000 mg | Freq: Once | SUBCUTANEOUS | Status: AC
  Administered 2017-02-07: 120 mg via SUBCUTANEOUS
  Filled 2017-02-07: qty 1.7

## 2017-02-07 NOTE — ED Triage Notes (Addendum)
Pt here requesting a refill on her Dilaudid prescription; pt was in the hospital this past week and had her PO dilaudid prescription increased from 1 pill every 4 hours to 2 every 3 hours (MD Domingo Cocking); pt called oncologist and nurse tried to fill prescription but pharmacy denied filling it; pt's pain is located in her back; pt in no apparent distress; ambulatory

## 2017-02-07 NOTE — Progress Notes (Signed)
Pt here for first time Xgeva injection.  Verbal and written explanations given to pt and sister of SEs of Xgeva.  Consent signed.  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

## 2017-02-07 NOTE — Patient Instructions (Signed)

## 2017-02-07 NOTE — Discharge Instructions (Signed)
Please follow-up with your oncologist tomorrow for refill of your medication. Please return to emergency department if you develop any new or worsening symptoms.

## 2017-02-07 NOTE — ED Provider Notes (Signed)
[facility] DEPT Provider Note  CSN: [id number] Arrival date & time: 02/07/17  1956  By signing my name below, I, Dyke Brackett, attest that this documentation has been prepared under the direction and in the presence of non-physician practitioner, Eliezer Mccoy PA-C. Electronically Signed: Dyke Brackett, Scribe. 02/07/2017. 8:53 PM.   History   Chief Complaint Chief Complaint  Patient presents with  . Medication Refill   HPI Stacey Cox is a 61 y.o. female with a history of metastatic breast cancer who presents to the Emergency Department requesting a refill on her Dilaudid prescription. Pt was seen here three days ago for persistent back pain and was instructed by the palliative care consult Dr. Domingo Cocking to increase her dosage to 4 mg Dilaudid every 3 hours, However she was not given any prescription. Pt states that she will run out of her narcotics tonight at 10 pm. She contacted her oncologist and the nurse wrote a refill for her initial prescription, but the pharmacy would not fill this because the RX was written as the same dosage as prior and was not written as the increased dosage. Per pt, she has an appointment with her oncologist in the morning and is requesting a prescription for enough medication until she can be seen tomorrow. Pt reports some nausea today after radiation treatment which has since resolved, but denies any abdominal pain. She denies any new symptoms and has no other complaints at this time.  The history is provided by the patient. No language interpreter was used.   Past Medical History:  Diagnosis Date  . Cancer [facility])    Metastatic Breast Cancer    Patient Active Problem List   Diagnosis Date Noted  . Bone metastases ([place]) 02/01/2017  . Osseous metastasis ([place])   . Malignant neoplasm metastatic to lung ([place])   . Neoplasm related pain   . Metastatic breast cancer ([place]) 01/21/2017  . Pathologic fracture   . Breast mass, left     Past Surgical  History:  Procedure Laterality Date  . CHOLECYSTECTOMY    . TONSILLECTOMY      OB History    No data available      Home Medications    Prior to Admission medications   Medication Sig Start Date End Date Taking? Authorizing Provider  dexamethasone (DECADRON) 4 MG tablet Take 1 tablet (4 mg total) by mouth daily. With breakfast 02/01/17   [name], MD  ergocalciferol (VITAMIN D2) 50000 units capsule Take 1 capsule (50,000 Units total) by mouth once a week. 02/01/17   [name], MD  escitalopram (LEXAPRO) 5 MG tablet Take 1 tablet (5 mg total) by mouth daily. 01/22/17   Holley Raring, MD  HYDROmorphone (DILAUDID) 2 MG tablet Take 2 tablets (4 mg total) by mouth every 3 (three) hours as needed for severe pain. 02/07/17   Artha Stavros M Keyonta Madrid, PA-C  LORazepam (ATIVAN) 0.5 MG tablet Take 1 tablet (0.5 mg total) by mouth every 6 (six) hours as needed for anxiety. 01/22/17   Holley Raring, MD  ondansetron (ZOFRAN) 4 MG tablet Take 1 tablet (4 mg total) by mouth every 8 (eight) hours as needed for nausea or vomiting. 01/22/17   Holley Raring, MD  oxyCODONE (OXYCONTIN) 20 mg 12 hr tablet Take 1 tablet (20 mg total) by mouth every 12 (twelve) hours. 02/01/17   [name], MD  polyethylene glycol [facility]) packet Take 17 g by mouth daily. Patient not taking: Reported on 02/05/2017 02/01/17   [name],  MD  Ribociclib & Letrozole (KISQALI FEMARA 600 DOSE) 200 & 2.5 MG TBPK Take 1 each by mouth See admin instructions. Ribociclib 600mg  3weeks on 1 week off. Letrozole 2.5mg  po daily without interruption. 02/05/17 03/07/17  [name], MD  senna-docusate (SENNA S) 8.6-50 MG tablet Take 2 tablets by mouth 2 (two) times daily. 02/01/17   [name], MD    Family History Family History  Problem Relation Age of Onset  . Cancer Mother     breast  . Cancer Sister     breast    Social History Social History  Substance Use Topics  . Smoking status: Former Smoker      Packs/day: 0.00    Types: Cigarettes    Quit date: 01/21/2017  . Smokeless tobacco: Never Used  . Alcohol use No   Allergies   Thorazine [chlorpromazine]   Review of Systems Review of Systems  Gastrointestinal: Positive for nausea (resolved). Negative for abdominal pain.  Musculoskeletal: Positive for back pain.   Physical Exam Updated Vital Signs BP 131/75 (BP Location: Right Arm)   Pulse (!) 56   Temp 98.6 F (37 C) (Oral)   Resp 20   SpO2 96%   Physical Exam  Constitutional: She appears well-developed and well-nourished. No distress.  HENT:  Head: Normocephalic and atraumatic.  Mouth/Throat: Oropharynx is clear and moist. No oropharyngeal exudate.  Eyes: Conjunctivae are normal. Pupils are equal, round, and reactive to light. Right eye exhibits no discharge. Left eye exhibits no discharge. No scleral icterus.  Neck: Normal range of motion. Neck supple. No thyromegaly present.  Cardiovascular: Normal rate, regular rhythm, normal heart sounds and intact distal pulses.  Exam reveals no gallop and no friction rub.   No murmur heard. Pulmonary/Chest: Effort normal and breath sounds normal. No stridor. No respiratory distress. She has no wheezes. She has no rales.  Abdominal: Soft. Bowel sounds are normal. She exhibits no distension. There is no tenderness. There is no rebound and no guarding.  Musculoskeletal: She exhibits no edema.  Lymphadenopathy:    She has no cervical adenopathy.  Neurological: She is alert. Coordination normal.  Skin: Skin is warm and dry. No rash noted. She is not diaphoretic. No pallor.  Psychiatric: She has a normal mood and affect.  Nursing note and vitals reviewed.  ED Treatments / Results  DIAGNOSTIC STUDIES:  Oxygen Saturation is 96% on room air, normal by my interpretation.    COORDINATION OF CARE:  8:58 PM Discussed treatment plan with pt at bedside and pt agreed to plan.   Labs (all labs ordered are listed, but only abnormal  results are displayed) Labs Reviewed - No data to display  EKG  EKG Interpretation None       Radiology No results found.  Procedures Procedures (including critical care time)  Medications Ordered in ED Medications - No data to display   Initial Impression / Assessment and Plan / ED Course  I have reviewed the triage vital signs and the nursing notes.  Pertinent labs & imaging results that were available during my care of the patient were reviewed by me and considered in my medical decision making (see chart for details).     Pt here for refill of medication. Medication is a controlled substance, however since the patient's extenuating circumstances and metastasis pain, will refill medication here. Patient given only one days worth of Dilaudid. Discussed need to follow up with oncologist tomorrow. Upon review of medical record and narcotic database, I  find no discrepancies. Patient understands and agrees with plan and is appreciative. Pt is safe for discharge at this time. I discussed patient case with Dr. Zenia Resides who agrees the plan.   Final Clinical Impressions(s) / ED Diagnoses   Final diagnoses:  Medication refill    New Prescriptions Discharge Medication List as of 02/07/2017  9:50 PM    I personally performed the services described in this documentation, which was scribed in my presence. The recorded information has been reviewed and is accurate.    Frederica Kuster, PA-C 02/08/17 [id number]    Lacretia Leigh, MD 02/08/17 [phone]

## 2017-02-07 NOTE — Progress Notes (Signed)
Prescription given to oral chemo pharmacist. Chemo counseling scheduled. Abigail Butts (edu) notified.

## 2017-02-08 ENCOUNTER — Encounter: Payer: Self-pay | Admitting: Hematology

## 2017-02-08 ENCOUNTER — Encounter: Payer: Self-pay | Admitting: *Deleted

## 2017-02-08 ENCOUNTER — Other Ambulatory Visit: Payer: Self-pay | Admitting: Hematology and Oncology

## 2017-02-08 ENCOUNTER — Ambulatory Visit: Payer: BC Managed Care – PPO

## 2017-02-08 ENCOUNTER — Telehealth: Payer: Self-pay | Admitting: *Deleted

## 2017-02-08 ENCOUNTER — Ambulatory Visit
Admission: RE | Admit: 2017-02-08 | Discharge: 2017-02-08 | Disposition: A | Discharge status: Home / Self Care | Payer: BC Managed Care – PPO | Source: Ambulatory Visit | Attending: Radiation Oncology | Admitting: Radiation Oncology

## 2017-02-08 ENCOUNTER — Other Ambulatory Visit: Payer: Self-pay

## 2017-02-08 ENCOUNTER — Encounter: Payer: Self-pay | Admitting: Pharmacist

## 2017-02-08 ENCOUNTER — Other Ambulatory Visit: Payer: BC Managed Care – PPO

## 2017-02-08 DIAGNOSIS — C7951 Secondary malignant neoplasm of bone: Secondary | ICD-10-CM | POA: Diagnosis not present

## 2017-02-08 LAB — PHOSPHORUS: Phosphorus, Ser: 3.5 mg/dL (ref 2.5–4.5)

## 2017-02-08 LAB — VITAMIN D 25 HYDROXY (VIT D DEFICIENCY, FRACTURES): Vitamin D, 25-Hydroxy: 14.2 ng/mL — ABNORMAL LOW (ref 30.0–100.0)

## 2017-02-08 MED ORDER — HYDROMORPHONE HCL 4 MG PO TABS: 4.0000 mg | ORAL_TABLET | ORAL | 0 refills | Status: DC | PRN

## 2017-02-08 MED FILL — HYDROmorphone HCL 4 MG TABS: 4 | 7 days supply | Qty: 60 | Fill #0

## 2017-02-08 NOTE — Telephone Encounter (Signed)
(  02/05/17) Patient recent ED visit palliative care was consulted and recommended 4 mg every 3 hours as needed for pain. Patient followed that sig with last prescription given on 02/01/17 by MD Irene Limbo (prescription read 1 2mg  tablet every 4 hours) and is currently out of medication. Patient called to get another refill. RN gave same prescription to patient. Pharmacy declined refill due to early request. Patient went to the ED last night 02/07/17 and given one day worth of Dilaudid.  MD Alvy Bimler notified and refill provided for this amount (4mg  every 3 hours with 60 quantity) and noted on prescription:"Saw palliative care recently: recent prescription of 2 mg was increased to 4 mg tablet. Prescription ran out sooner because of the recommendation" Per MD Alvy Bimler, patient was instructed to return prescription that was given to her on 02/07/17 by RN. And have this new prescription filled at [facility]. Patient informed and verbalized instructed.

## 2017-02-08 NOTE — Telephone Encounter (Signed)
Old prescription return for Dilaudid and new prescription given. RN discarded.

## 2017-02-08 NOTE — Progress Notes (Signed)
Met with patient to introduce myself as her Estate manager/land agent and to discuss insurance information and co-pay options. Patient is not sure if she has met her OOP but thinks she has met her deductible. Advised patient to contact the number on her card to obtain specific benefit information for her policy. Patient also states she will contact HR to get details on other coverage such as cancer policies.  Offered patient opportunity to apply for co-pay assistance through [employer] for [employer]. Patient agreed to apply. Applied online for patient. Patient approved for up to $10,000 with no out of pocket cost for the first dose and each dose afterwards would only leave her with a $25 co-pay. Advised patient there is nothing she needs to keep up with on her end, I will monitor her account and make payment on her behalf. Patient has my card for any additional financial questions or concerns.

## 2017-02-08 NOTE — Progress Notes (Signed)
Nutrition Assessment   Reason for Assessment:   New diagnosis of breast cancer.    ASSESSMENT:  61 year old female with new diagnosis of metastatic left breast cancer with left axillary and subpectoral lymphadenopathy, significant osseous metastatases, pulmonary metastases.  Patient receiving XRT to  lumbar spine and sacrum.  Patient seen in clinic this am with friend.  Patient reports normal appetite and stable weight.  Reports did have an episode of nausea, vomiting after radiation yesterday.  Patient also reports dry mouth.  No other nutrition related symptoms at this time.  Patient interested in learning about healthy diet.    Nutrition Focused Physical Exam: deferred  Medications: decadron, Vit D2, ativan, zofran, miralax, senna  Labs: glucose 145  Anthropometrics:   Height: 65 inches Weight: 187 pounds UBW: 187 pounds BMI: 31  NUTRITION DIAGNOSIS: Food and nutrition related knowledge deficit related to new diagnosis of breast cancer as evidenced by no prior need for diet education   MALNUTRITION DIAGNOSIS: none at this time   INTERVENTION:   Discussed plant based diet including good sources of whole grains, lean meats and lowfat dairy products.  Fact sheet given Also discussed and provided fact sheet regarding nausea, vomiting and dry mouth.   Provided patient with free cookbook as well as friend will be helping her prepare meals during her treatment.    MONITORING, EVALUATION, GOAL: Patient will consume adequate calories and protein to maintain good nutrition during treatment   NEXT VISIT: as needed.  Contact information given and patient will contact me with questions or concerns  Lisa-Marie Rueger B. Zenia Resides, [place], [place] Registered Dietitian [phone] (pager)

## 2017-02-08 NOTE — Progress Notes (Signed)
Oral Chemotherapy Pharmacist Encounter   I spoke with patient for overview of new oral chemotherapy medication: Kisqali/Femara.   The prescription has been sent to the [facility] for benefit analysis and approval.   I provided pt w/ starter pack of Kisqali/Femara (lot# [id number]; exp 08/2017).  Counseled patient on administration, dosing, side effects, safe handling, and monitoring. Side effects include but not limited to: fatigue, myelosuppression, diarrhea, constipation, hot flashes.  She voiced understanding and appreciation.   Pt was prescribed Lexapro by Holley Raring, MD [facility]) for anxiety/depression related to cancer diagnosis.  She has been taking this.   Lexapro carries moderate risk for QTc prolongation.  When combined w/ Kisqali, the risk of QTc prolongation is increased and the combination should be avoided (risk category X).  Patient is aware of the risk and she will no longer take Lexapro.  No need for taper.  Pt has Ativan PRN anxiety she may use.  EKG today: QTc = 453 msec.  EKG will be done again 02/14/17 during next visit.  EKG will need to be done at the start of cycle 2 also (not ordered).  Will make MD aware.  All current questions answered.  She has reading materials on these new drugs.   Will follow up with patient regarding insurance and pharmacy.   Thank you, Kennith Center, Pharm.D., CPP 02/08/2017@2 :09 PM Oral Chemotherapy Clinic

## 2017-02-09 ENCOUNTER — Ambulatory Visit: Payer: BC Managed Care – PPO | Admitting: Radiation Oncology

## 2017-02-09 ENCOUNTER — Ambulatory Visit
Admission: RE | Admit: 2017-02-09 | Discharge: 2017-02-09 | Disposition: A | Discharge status: Home / Self Care | Payer: BC Managed Care – PPO | Source: Ambulatory Visit | Attending: Radiation Oncology | Admitting: Radiation Oncology

## 2017-02-09 DIAGNOSIS — C7951 Secondary malignant neoplasm of bone: Secondary | ICD-10-CM | POA: Diagnosis not present

## 2017-02-09 NOTE — Progress Notes (Signed)
Patient presents to nursing complaining of nausea and vomiting following radiation. Advised patient to taking Zofran 4 mg 45-1 hour prior to radiation therapy each day. Also, encouraged her to take Zofran OTC (every eight hours) until she is able to keep food down. Explained that Ativan can relieve nausea but, advised against driving while taking Ativan. Encouraged patient to force fluids such as pedialyte, gatorade or powerade. Encouraged a [employer]. Explained that bananas, rice, apples and toast are encouraged during times of nausea and vomiting. Patient to inform this RN if they suggestion don't help. In addition, provided patient with NAUSEA AND VOMITING management worksheets and reviewed pertinent information. Patient verbalized understanding of all reviewed.

## 2017-02-10 ENCOUNTER — Telehealth: Payer: Self-pay | Admitting: *Deleted

## 2017-02-10 ENCOUNTER — Other Ambulatory Visit: Payer: Self-pay | Admitting: Urology

## 2017-02-10 ENCOUNTER — Ambulatory Visit
Admission: RE | Admit: 2017-02-10 | Discharge: 2017-02-10 | Disposition: A | Discharge status: Home / Self Care | Payer: BC Managed Care – PPO | Source: Ambulatory Visit | Attending: Radiation Oncology | Admitting: Radiation Oncology

## 2017-02-10 ENCOUNTER — Other Ambulatory Visit: Payer: Self-pay | Admitting: *Deleted

## 2017-02-10 ENCOUNTER — Ambulatory Visit: Payer: BC Managed Care – PPO

## 2017-02-10 ENCOUNTER — Telehealth: Payer: Self-pay | Admitting: Pharmacist

## 2017-02-10 DIAGNOSIS — C50919 Malignant neoplasm of unspecified site of unspecified female breast: Secondary | ICD-10-CM

## 2017-02-10 DIAGNOSIS — C7951 Secondary malignant neoplasm of bone: Secondary | ICD-10-CM | POA: Diagnosis not present

## 2017-02-10 MED ORDER — DEXAMETHASONE 4 MG PO TABS
4.0000 mg | ORAL_TABLET | ORAL | Status: AC
  Administered 2017-02-10: 4 mg via ORAL
  Filled 2017-02-10: qty 1

## 2017-02-10 MED ORDER — DEXAMETHASONE 4 MG PO TABS
4.0000 mg | ORAL_TABLET | Freq: Every day | ORAL | Status: DC
  Filled 2017-02-10: qty 1

## 2017-02-10 MED ORDER — ONDANSETRON HCL 4 MG PO TABS: 4.0000 mg | ORAL_TABLET | Freq: Three times a day (TID) | ORAL | 0 refills | Status: DC | PRN

## 2017-02-10 NOTE — Telephone Encounter (Signed)
Oral Chemotherapy Pharmacist Encounter  Received notification from [place] that Kisqali/Femara [place] would require prior authorization.  PA submitted on CoverMyMeds [facility] Status is pending May take 72 hours for determination.  Noted patient given sample starter pack on 02/08/17  Oral Oncology Clinic will continue to follow.   Johny Drilling, PharmD, BCPS, BCOP 02/10/2017  9:47 AM Oral Oncology Clinic [phone]

## 2017-02-10 NOTE — Telephone Encounter (Signed)
Informed patient, EKG done, and labs/office visit scheduled.

## 2017-02-10 NOTE — Telephone Encounter (Signed)
-----   Message from [name], MD sent at 02/09/2017 10:38 PM EDT ----- Regarding: RE: Drug Interaction w/ Thelma Comp - category Joneen Boers, Could you please make sure patient has labs cbc, cmp magnesium and phosphorus and a baseline EKG.  Please let me know if the Qtc is prolonged on the EKG. Have patient hold lexapro. We will decide on an alternative anti-depressant on her followup depend on EKG changes is any after being on Ribociclib. thx GK  ----- Message ----- From: Tora Kindred, [facility] Sent: 02/07/2017  11:29 AM To: Enis Gash, RPH, Ronnette Juniper, RN, # Subject: Drug Interaction w/ Kisqali - category X       Giuliana Handyside is starting Kisqali and she is on Lexapro.  This is a category X drug interaction - avoid the combination.  May cause QTc prolongation. Pt was started on Lexapro by Holley Raring, MD (resident w/ [facility]) on 01/22/17 upon hospital discharge for cancer related anxiety/depression. Lexapro has moderate risk for QTc prolongation.  Should she be on a different agent/stop the Lexapro while on Kisqali?  Pt needs EKG prior to starting Kisqali and then day 14 of cycle 1 and again at the start of cycle 2.  I don't see that an EKG has been ordered.  She's coming tomorrow (3/20) and I plan to counsel her on Kisqali.  Thanks so much! Kennith Center, Pharm.D., CPP 02/07/2017@11 :33 AM

## 2017-02-11 ENCOUNTER — Ambulatory Visit: Payer: BC Managed Care – PPO

## 2017-02-11 ENCOUNTER — Ambulatory Visit
Admission: RE | Admit: 2017-02-11 | Discharge: 2017-02-11 | Disposition: A | Discharge status: Home / Self Care | Payer: BC Managed Care – PPO | Source: Ambulatory Visit | Attending: Radiation Oncology | Admitting: Radiation Oncology

## 2017-02-11 DIAGNOSIS — C7951 Secondary malignant neoplasm of bone: Secondary | ICD-10-CM | POA: Diagnosis not present

## 2017-02-13 NOTE — Progress Notes (Signed)
[name]    HEMATOLOGY/ONCOLOGY CONSULTATION NOTE  Date of Service: 02/13/2017  Patient Care Team: No Pcp Per Patient as PCP - General (General Practice)  CHIEF COMPLAINTS/PURPOSE OF CONSULTATION:  Newly diagnosed metastatic breast cancer   HISTORY OF PRESENTING ILLNESS:   Stacey Cox is a wonderful 61 y.o. female who has been referred to [place] by Dr Lucious Groves, DO  for evaluation and management of likely metastatic breast cancer.  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 She also notes having developed left lower inner breast discomfort and  Ulceration over the last several weeks.  Patient has a CT abd renal proctocol to R/o kidney stones that showed Multiple metastasis in the thoracic and lumbar spine included LEFT L3 transverse process and RIGHT T12 transverse pathologic fractures. Multiple pulmonary nodules measuring to 9 mm.   Patient notes no weight loss no night sweat. Last mammogram >10 yrs ago.  No headaches no overt FND FHx of breast cancer in her mother and sister.  Patient noted that she has a phobia of the medical system and therefore had not actively sought a PCP or routine cares. She was anxious about how she how be able to management her live "alone". Supportive counseling was provided.  A CT chest, bone scan and MRI brain were recommended to complete staging workup.  CT chest was done which showed Primary left breast cancer with left axillary and subpectoral adenopathy.  Metastatic disease in the thorax with multiple pulmonary nodules mild mediastinal adenopathy. Left hilar soft tissue prominence may be central pulmonary nodule or adenopathy. Osseous metastatic disease with mild T6 compression fracture with lucent lesion anteriorly and lucencies in the posterior elements of T9 through T12. Fracture of right anterior rib has surrounding callus is likely a pathologic  fracture.  Bone scan was done which showed significant osseous metastatic disease involving spine, calvarium and rt ribs cage.  Patient was admitted for symptoms control and expedited workup. She has been seen by Surgery.  Patient subsequently has had mammogram which showed Within the medial aspect of the left breast there is a 4.1 cm ulcerated mass. Slightly anterior to this within the medial left breast middle depth is a 2.3 cm oval lobular mass. Within the upper inner left breast there is a 1.4 cm mass with associated distortion. Within the slightly medial right breast middle depth there is a 0.8 m oval circumscribed mass. Multiple prominent left axillary lymph nodes are demonstrated on mammography.  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 No DCIS noted. Lymph node was positive for metastatic carcinoma consistent with breast primary.  Patient notes difficult to control lower back pain despite OxyContin and when necessary oxycodone. She is agreeable to getting a referral to radiation oncology for palliative radiation to her significantly symptomatic spinal metastases.  MEDICAL HISTORY:  Past Medical History:  Diagnosis Date  . Cancer [facility])    Metastatic Breast Cancer    SURGICAL HISTORY: Past Surgical History:  Procedure Laterality Date  . CHOLECYSTECTOMY    . TONSILLECTOMY      SOCIAL HISTORY: Social History   Social History  . Marital status: Single    Spouse name: N/A  . Number of children: N/A  . Years of  education: N/A   Occupational History  . Not on file.   Social History Main Topics  . Smoking status: Former Smoker    Packs/day: 0.00    Types: Cigarettes    Quit date: 01/21/2017  . Smokeless tobacco: Never Used  . Alcohol use No  . Drug use: No  . Sexual activity: No    Other Topics Concern  . Not on file   Social History Narrative  . No narrative on file  Cigarette smoker 1/2PPD for about 40 yrs Social alcohol use No drugs Professor at [employer] no children  FAMILY HISTORY: Family History  Problem Relation Age of Onset  . Cancer Mother     breast  . Cancer Sister     breast  Mother with h/o breast cancer in her 42's Sister with breast cancer in her 61ys and later was diagnosed with multiple myeloma. (Patient is not aware of any specific breast cancer mutations present)  ALLERGIES:  is allergic to thorazine [chlorpromazine].  MEDICATIONS:  Current Outpatient Prescriptions  Medication Sig Dispense Refill  . LORazepam (ATIVAN) 0.5 MG tablet Take 1 tablet (0.5 mg total) by mouth every 6 (six) hours as needed for anxiety. 60 tablet 0  . oxyCODONE (OXYCONTIN) 20 mg 12 hr tablet Take 1 tablet (20 mg total) by mouth every 12 (twelve) hours. 60 tablet 0  . dexamethasone (DECADRON) 4 MG tablet Take 1 tablet (4 mg total) by mouth daily. With breakfast 30 tablet 0  . ergocalciferol (VITAMIN D2) 50000 units capsule Take 1 capsule (50,000 Units total) by mouth once a week. 12 capsule 0  . HYDROmorphone (DILAUDID) 4 MG tablet Take 1 tablet (4 mg total) by mouth every 3 (three) hours as needed for severe pain. 60 tablet 0  . ondansetron (ZOFRAN) 4 MG tablet Take 1 tablet (4 mg total) by mouth every 8 (eight) hours as needed for nausea or vomiting. 20 tablet 0  . polyethylene glycol (MIRALAX) packet Take 17 g by mouth daily. (Patient not taking: Reported on 02/05/2017) 30 each 1  . Ribociclib & Letrozole (KISQALI FEMARA 600 DOSE) 200 & 2.5 MG TBPK Take 1 each by mouth See admin instructions. Ribociclib 678m 3weeks on 1 week off. Letrozole 2.551mpo daily without interruption. 49 each 2  . senna-docusate (SENNA S) 8.6-50 MG tablet Take 2 tablets by mouth 2 (two) times daily. 60 tablet 1   No current facility-administered medications for this visit.      REVIEW OF SYSTEMS:    10 Point review of Systems was done is negative except as noted above.  PHYSICAL EXAMINATION: ECOG PERFORMANCE STATUS: 1 - Symptomatic but completely ambulatory  . Vitals:   02/01/17 1203  BP: 132/66  Pulse: 71  Resp: 18  Temp: 98.3 F (36.8 C)   Filed Weights   02/01/17 1203  Weight: 187 lb (84.8 kg)   .Body mass index is 31.12 kg/m.  GENERAL:alert, in no acute distress and comfortable SKIN: no acute rashes, no significant lesions EYES: conjunctiva are pink and non-injected, sclera anicteric OROPHARYNX: MMM, no exudates, no oropharyngeal erythema or ulceration NECK: supple, no JVD LYMPH:  no palpable lymphadenopathy in the cervical, axillary or inguinal regions LUNGS: clear to auscultation b/l with normal respiratory effort HEART: regular rate & rhythm ABDOMEN:  normoactive bowel sounds , non tender, not distended. Extremity: no pedal edema PSYCH: alert & oriented x 3 with fluent speech NEURO: no focal motor/sensory deficits Breast examination was done recently in the hospital and not  repeated today  LABORATORY DATA:  I have reviewed the data as listed  . CBC Latest Ref Rng & Units 02/07/2017 01/31/2017 01/21/2017  WBC 3.9 - 10.3 10e3/uL 11.9(H) 9.6 -  Hemoglobin 11.6 - 15.9 g/dL 13.3 12.5 14.6  Hematocrit 34.8 - 46.6 % 39.5 37.6 43.0  Platelets 145 - 400 10e3/uL 300 231 -    . CMP Latest Ref Rng & Units 02/07/2017 01/31/2017 01/21/2017  Glucose 70 - 140 mg/dl 145(H) 101(H) 149(H)  BUN 7.0 - 26.0 mg/dL 17._0 Creatinine 0.6 - 1.1 mg/dL 0.9 0.94 1.00  Sodium 136 - 145 mEq/L 139 133(L) 142  Potassium 3.5 - 5.1 mEq/L 3.7 3.8 3.8  Chloride 101 - 111 mmol/L - 99(L) 103  CO2 22 - 29 mEq/L 24 25 -  Calcium 8.4 - 10.4 mg/dL 9.5 9.6 -  Total Protein 6.4 - 8.3 g/dL 7.2 7.1 -  Total Bilirubin 0.20 - 1.20 mg/dL 0.37 0.6 -  Alkaline Phos 40 - 150 U/L 135 127(H) -  AST 5 - 34 U/L 29 35 -  ALT 0 - 55 U/L 42 46 -   Component     Latest Ref  Rng & Units 01/21/2017 02/07/2017  HIV     Non Reactive Non Reactive   CA 15-3     0.0 - 25.0 U/mL 32.4 (H)   CA 27.29     0.0 - 38.6 U/mL 37.6   Vitamin D, 25-Hydroxy     30.0 - 100.0 ng/mL  14.2 (L)  Phosphorus     2.5 - 4.5 mg/dL  3.5            RADIOGRAPHIC STUDIES: I have personally reviewed the radiological images as listed and agreed with the findings in the report. Dg Cervical Spine 2 Or 3 Views  Result Date: 01/21/2017 CLINICAL DATA:  Findings of metastatic disease on recent CT examination with neck pain EXAM: CERVICAL SPINE - 3 VIEW COMPARISON:  None. FINDINGS: Six cervical segments are well visualized on this exam. The seventh cervical segment is only partially visualized. No compression deformities are seen. Facet hypertrophic changes are noted commenced with the patient's age. The odontoid is within normal limits. IMPRESSION: Degenerative change without sclerosis to suggest metastatic disease. If clinically indicated MRI or bone scan would be much more sensitive for occult bony metastatic disease Electronically Signed   By: Inez Catalina M.D.   On: 01/21/2017 10:44   Dg Thoracic Spine 2 View  Result Date: 01/21/2017 CLINICAL DATA:  Abnormal CT EXAM: THORACIC SPINE 2 VIEWS COMPARISON:  CT from earlier in the same day FINDINGS: Pedicles are within normal limits. Vertebral body height is well maintained with the exception of T6 which demonstrates very mild compression deformity. This is of uncertain significance. MRI would be helpful for further evaluation as necessary. No other compression deformities are seen. Mild osteophytic changes are noted. The previously seen changes of T12 are not well appreciated on this exam. IMPRESSION: T6 compression deformity of a mild degree. This may be chronic in nature as no prior films are available for comparison. MRI may be helpful for further evaluation as clinically necessary. Electronically Signed   By: Inez Catalina M.D.   On: 01/21/2017 10:42    Dg Lumbar Spine 2-3 Views  Result Date: 01/21/2017 CLINICAL DATA:  Abnormal CT showing metastatic disease at L2 EXAM: LUMBAR SPINE - 2-3 VIEW COMPARISON:  None. FINDINGS: Vertebral body height is well maintained. Mild sclerosis is noted in the posterior inferior aspect  of L2 corresponding to that seen on prior CT examination. Mild osteophytic changes are noted. IMPRESSION: L2 metastatic disease similar to that seen on recent CT. Electronically Signed   By: Inez Catalina M.D.   On: 01/21/2017 10:43   Ct Chest Wo Contrast  Result Date: 01/21/2017 CLINICAL DATA:  Metastatic breast cancer. Thoraco lumbar metastasis on bone scan. Right rib activity on bone scan. EXAM: CT CHEST WITHOUT CONTRAST TECHNIQUE: Multidetector CT imaging of the chest was performed following the standard protocol without IV contrast. COMPARISON:  Bone scan earlier this day. Included lung bases from CT abdomen/ pelvis earlier this day. FINDINGS: Cardiovascular: Mild atherosclerosis of the thoracic aorta. The heart is normal in size. A few coronary artery calcifications are seen. Mediastinum/Nodes: 15 mm subcarinal node. 11 mm lower paratracheal node. 8 mm right upper paratracheal node. Small bilateral supraclavicular nodes. Multiple enlarged small left axillary lymph nodes measure up to 14 mm. There prominent left subpectoral nodes. Limited assessment for hilar adenopathy given lack of IV contrast, there is left hilar prominence. No enlarged right axillary lymph nodes. The esophagus is decompressed. Visualized thyroid gland is normal. Lungs/Pleura: Multifocal pulmonary nodules consistent with metastatic disease. These involve all lobes of both lungs. For example paramediastinal right middle lobe nodule measures 10 mm, series 205 image 56 left upper lobe perifissural nodule measures 13 x 8 mm image 35. Central left upper lobe nodule versus adenopathy with surrounding spiculation. Upper Abdomen: Cholecystectomy clips. No adrenal nodule.  Patient had CT abdomen/ pelvis earlier this day. Musculoskeletal: Irregular medial left breast lesion measures approximately 3 cm. There is diffuse left breast skin thickening and probable ulceration. Probable additional left breast nodule in the region of skin thickening. There is callus formation about right lateral eighth rib. Lucency within right T12 transverse process. There is equivocal diminished density T11, T10, and T9 right transverse processes. Minimal anterior compression of T6 with likely underlying lucent lesion. There is no evidence of epidural tumor extension. IMPRESSION: 1. Primary left breast cancer with left axillary and subpectoral adenopathy. 2. Metastatic disease in the thorax with multiple pulmonary nodules mild mediastinal adenopathy. Left hilar soft tissue prominence may be central pulmonary nodule or adenopathy. 3. Osseous metastatic disease with mild T6 compression fracture with lucent lesion anteriorly and lucencies in the posterior elements of T9 through T12. Fracture of right anterior rib has surrounding callus is likely a pathologic fracture. Electronically Signed   By: Jeb Levering M.D.   On: 01/21/2017 21:55   Mr Brain Wo Contrast  Result Date: 01/22/2017 CLINICAL DATA:  Newly diagnosed metastatic left breast cancer. EXAM: MRI HEAD WITHOUT CONTRAST TECHNIQUE: Multiplanar, multiecho pulse sequences of the brain and surrounding structures were obtained without intravenous contrast. COMPARISON:  Nuclear medicine whole-body bone scan 01/21/2017 FINDINGS: The examination was ordered without IV contrast, and cerebral volume is within normal limits for age. The patient is severely claustrophobic. Brain: There is no evidence of acute infarct, intracranial hemorrhage, midline shift, or extra-axial fluid collection. A few small foci of T2 hyperintensity in the cerebral white matter are nonspecific and not greater than expected for patient's age. There is advanced cerebellar atrophy.  Cerebral volume is within normal limits. There is a rounded 11 mm mass in the posterior fossa located along the anteroinferior margin of the right cerebellar hemisphere just posterior to the jugular bulb. This appears extra-axial in location and does not result in cerebellar mass effect. There is also a 6 mm nodular T2 hypointense extra-axial focus in the right aspect of  the prepontine cistern just posterior to the right cavernous sinus. No definite cerebral mass lesions are identified, however sensitivity is reduced by the lack of IV contrast. There is no evidence of vasogenic edema. There is evidence of mild dural thickening versus a tiny subdural collection in the left occipital region measuring 2-3 mm in thickness without associated mass effect. Vascular: Major intracranial vascular flow voids are preserved. Skull and upper cervical spine: Large area of confluent abnormal bone marrow signal in the left occipital skull overlying the area of dural thickening/ tiny collection described above and corresponding to the abnormal uptake on recent nuclear medicine bone scan. A smaller lesion is present in the right parietal skull. Sinuses/Orbits: Unremarkable orbits. Paranasal sinuses and mastoid air cells are clear. Other: None. IMPRESSION: 1. 11 mm extra-axial mass in the posterior fossa. Appearance favors a small meningioma over metastasis. 2. 6 mm nodule in the prepontine cistern, indeterminate. This could reflect an additional small meningioma. This is close to but not clearly contiguous with the right cavernous ICA to strongly suggest aneurysm. Postcontrast brain MRI is recommended for further evaluation. 3. Osseous skull metastases, including a large left occipital lobe lesion. Underlying thin extra-axial signal abnormality is favored to reflect mild dural thickening (either reactive or due to tumor infiltration) though a tiny subdural fluid collection/hematoma is also possible. This can be further evaluated on  postcontrast MRI. 4. No definite parenchymal brain metastases, however evaluation is limited by lack of IV contrast. 5. Advanced cerebellar atrophy. Electronically Signed   By: Logan Bores M.D.   On: 01/22/2017 09:23   Nm Bone Scan Whole Body  Result Date: 01/21/2017 CLINICAL DATA:  Metastatic breast carcinoma EXAM: NUCLEAR MEDICINE WHOLE BODY BONE SCAN TECHNIQUE: Whole body anterior and posterior images were obtained approximately 3 hours after intravenous injection of radiopharmaceutical. RADIOPHARMACEUTICALS:  22.3 mCi Technetium-85mMDP IV COMPARISON:  CT from earlier in the same day FINDINGS: Adequate uptake of radioactive tracer is noted throughout the bony skeleton. Bilateral renal activity is noted. There multifocal areas of increased activity identified within the thoracic and lumbar spine at what appear to be T8, T10, T11 and T12 as well as L2 and L3. Some of these changes correspond to those seen on recent CT examination. Additionally there is increased activity in the lateral and anterior right rib cage which correspond to healing fractures in the right ribcage. Scattered degenerative changes are noted particularly in the feet bilaterally. Increased activity is noted in the left posterior calvarium highly suspicious for metastatic lesion. This was not well appreciated on recent plain film examination of the cervical spine. IMPRESSION: Changes consistent with multifocal metastatic disease throughout the lower thoracic and lumbar spine as described. Increased activity is noted within the right ribcage laterally consistent with healing fractures. These may be pathologic in nature. Calvarial metastatic disease is noted on the left posteriorly. Electronically Signed   By: MInez CatalinaM.D.   On: 01/21/2017 15:51   [place]Breast Ltd Uni Left Inc Axilla  Result Date: 01/28/2017 CLINICAL DATA:  Patient presents for evaluation of ulcerated left breast mass. EXAM: 2D DIGITAL DIAGNOSTIC BILATERAL MAMMOGRAM WITH  CAD AND ADJUNCT TOMO ULTRASOUND BILATERAL BREAST COMPARISON:  None ACR Breast Density Category b: There are scattered areas of fibroglandular density. FINDINGS: Within the medial aspect of the left breast there is a 4.1 cm ulcerated mass. Slightly anterior to this within the medial left breast middle depth is a 2.3 cm oval lobular mass. Within the upper inner left breast there is  a 1.4 cm mass with associated distortion. Within the slightly medial right breast middle depth there is a 0.8 cm oval circumscribed mass. Multiple prominent left axillary lymph nodes are demonstrated on mammography. Mammographic images were processed with CAD. On physical exam, there is an ulcerated mass within the lower inner left breast. Additionally there is a palpable mass within the periareolar left breast. Targeted ultrasound is performed, showing an ill-defined difficult to evaluate mass within the left breast 9 o'clock position 5 cm from the nipple with superficial ulceration. This mass measures up to approximately 3 x 3 x 3.4 cm. Within the left breast 9 o'clock position 1 cm from the nipple there is a 3.7 x 2.9 x 1.6 cm irregular hypoechoic mass which appears to have invade the adjacent skin. Within left breast 10 o'clock position 3 cm from nipple there is a 1.2 x 1.0 x 1.4 cm irregular taller than wide mixed echogenicity mass. There are least 4 cortically thickened enlarged left axillary lymph nodes measuring up to 1 cm in thickness. Within the right breast 2 o'clock position 5 cm from nipple there is a 5 x 3 x 6 mm simple cyst, corresponding with mammographically identified mass. IMPRESSION: Ulcerated mass within the medial left breast most compatible with primary breast malignancy. There are 2 additional suspicious masses within the left breast concerning for additional sites of disease. Multiple cortically thickened left axillary lymph nodes concerning for axillary metastasis. RECOMMENDATION: Ultrasound-guided core needle  biopsy large ulcerated left breast mass. Ultrasound-guided core needle biopsy cortically thickened left axillary lymph node. Depending upon treatment plan, the patient may need biopsies of the additional suspicious left breast masses. I have discussed the findings and recommendations with the patient. Results were also provided in writing at the conclusion of the visit. If applicable, a reminder letter will be sent to the patient regarding the next appointment. BI-RADS CATEGORY  5: Highly suggestive of malignancy. Electronically Signed   By: Lovey Newcomer M.D.   On: 01/28/2017 13:17   US Breast Ltd Uni Right Inc Axilla  Result Date: 01/28/2017 CLINICAL DATA:  Patient presents for evaluation of ulcerated left breast mass. EXAM: 2D DIGITAL DIAGNOSTIC BILATERAL MAMMOGRAM WITH CAD AND ADJUNCT TOMO ULTRASOUND BILATERAL BREAST COMPARISON:  None ACR Breast Density Category b: There are scattered areas of fibroglandular density. FINDINGS: Within the medial aspect of the left breast there is a 4.1 cm ulcerated mass. Slightly anterior to this within the medial left breast middle depth is a 2.3 cm oval lobular mass. Within the upper inner left breast there is a 1.4 cm mass with associated distortion. Within the slightly medial right breast middle depth there is a 0.8 cm oval circumscribed mass. Multiple prominent left axillary lymph nodes are demonstrated on mammography. Mammographic images were processed with CAD. On physical exam, there is an ulcerated mass within the lower inner left breast. Additionally there is a palpable mass within the periareolar left breast. Targeted ultrasound is performed, showing an ill-defined difficult to evaluate mass within the left breast 9 o'clock position 5 cm from the nipple with superficial ulceration. This mass measures up to approximately 3 x 3 x 3.4 cm. Within the left breast 9 o'clock position 1 cm from the nipple there is a 3.7 x 2.9 x 1.6 cm irregular hypoechoic mass which appears  to have invade the adjacent skin. Within left breast 10 o'clock position 3 cm from nipple there is a 1.2 x 1.0 x 1.4 cm irregular taller than wide mixed echogenicity mass. There  are least 4 cortically thickened enlarged left axillary lymph nodes measuring up to 1 cm in thickness. Within the right breast 2 o'clock position 5 cm from nipple there is a 5 x 3 x 6 mm simple cyst, corresponding with mammographically identified mass. IMPRESSION: Ulcerated mass within the medial left breast most compatible with primary breast malignancy. There are 2 additional suspicious masses within the left breast concerning for additional sites of disease. Multiple cortically thickened left axillary lymph nodes concerning for axillary metastasis. RECOMMENDATION: Ultrasound-guided core needle biopsy large ulcerated left breast mass. Ultrasound-guided core needle biopsy cortically thickened left axillary lymph node. Depending upon treatment plan, the patient may need biopsies of the additional suspicious left breast masses. I have discussed the findings and recommendations with the patient. Results were also provided in writing at the conclusion of the visit. If applicable, a reminder letter will be sent to the patient regarding the next appointment. BI-RADS CATEGORY  5: Highly suggestive of malignancy. Electronically Signed   By: Lovey Newcomer M.D.   On: 01/28/2017 13:17   Mm Diag Breast Tomo Bilateral  Result Date: 01/28/2017 CLINICAL DATA:  Patient presents for evaluation of ulcerated left breast mass. EXAM: 2D DIGITAL DIAGNOSTIC BILATERAL MAMMOGRAM WITH CAD AND ADJUNCT TOMO ULTRASOUND BILATERAL BREAST COMPARISON:  None ACR Breast Density Category b: There are scattered areas of fibroglandular density. FINDINGS: Within the medial aspect of the left breast there is a 4.1 cm ulcerated mass. Slightly anterior to this within the medial left breast middle depth is a 2.3 cm oval lobular mass. Within the upper inner left breast there is a  1.4 cm mass with associated distortion. Within the slightly medial right breast middle depth there is a 0.8 cm oval circumscribed mass. Multiple prominent left axillary lymph nodes are demonstrated on mammography. Mammographic images were processed with CAD. On physical exam, there is an ulcerated mass within the lower inner left breast. Additionally there is a palpable mass within the periareolar left breast. Targeted ultrasound is performed, showing an ill-defined difficult to evaluate mass within the left breast 9 o'clock position 5 cm from the nipple with superficial ulceration. This mass measures up to approximately 3 x 3 x 3.4 cm. Within the left breast 9 o'clock position 1 cm from the nipple there is a 3.7 x 2.9 x 1.6 cm irregular hypoechoic mass which appears to have invade the adjacent skin. Within left breast 10 o'clock position 3 cm from nipple there is a 1.2 x 1.0 x 1.4 cm irregular taller than wide mixed echogenicity mass. There are least 4 cortically thickened enlarged left axillary lymph nodes measuring up to 1 cm in thickness. Within the right breast 2 o'clock position 5 cm from nipple there is a 5 x 3 x 6 mm simple cyst, corresponding with mammographically identified mass. IMPRESSION: Ulcerated mass within the medial left breast most compatible with primary breast malignancy. There are 2 additional suspicious masses within the left breast concerning for additional sites of disease. Multiple cortically thickened left axillary lymph nodes concerning for axillary metastasis. RECOMMENDATION: Ultrasound-guided core needle biopsy large ulcerated left breast mass. Ultrasound-guided core needle biopsy cortically thickened left axillary lymph node. Depending upon treatment plan, the patient may need biopsies of the additional suspicious left breast masses. I have discussed the findings and recommendations with the patient. Results were also provided in writing at the conclusion of the visit. If applicable, a  reminder letter will be sent to the patient regarding the next appointment. BI-RADS CATEGORY  5: Highly  suggestive of malignancy. Electronically Signed   By: Lovey Newcomer M.D.   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 This was found to be concordant by Dr. Lovey Newcomer. The patient reported doing well after the biopsies. Post biopsy instructions and care were reviewed and questions were answered. The patient was encouraged to call [facility] for any additional concerns. The patient has an appointment with Dr. Sullivan Lone on February 01, 2017, and prefers to discuss the results with him at that time. Dr. Irene Limbo was notified of the patient's request. Pathology results reported by Susa Raring RN, BSN on 02/01/2017. Electronically Signed   By: Lovey Newcomer M.D.   On: 02/01/2017 10:12   Result Date: 02/07/2017 CLINICAL DATA:  Patient with enlarged left axillary lymph node. EXAM: ULTRASOUND GUIDED CORE NEEDLE BIOPSY OF A LEFT AXILLARY NODE COMPARISON:  Previous exam(s). FINDINGS: I met with the patient and we discussed the procedure of ultrasound-guided biopsy, including benefits and alternatives. We discussed the high likelihood of a successful procedure. We discussed the risks of the procedure, including infection, bleeding, tissue injury, clip migration, and inadequate sampling. Informed written consent was given. The usual time-out protocol was performed immediately prior to the procedure. Using sterile technique and 1% Lidocaine as local anesthetic, under direct ultrasound visualization, a 14 gauge spring-loaded device was used to perform biopsy of cortically thickened left axillary lymph node using a lateral approach. At the  conclusion of the procedure a HydroMARK tissue marker clip was deployed into the biopsy cavity. Follow up 2 view mammogram was performed and dictated separately. IMPRESSION: Ultrasound guided biopsy of a cortically thickened left axillary lymph node. No apparent complications. Electronically Signed: By: Lovey Newcomer M.D. On: 01/28/2017 12:15   Ct Renal Stone Study  Result Date: 01/21/2017 CLINICAL DATA:  Low back and pelvic pain for 1 week. History of cholecystectomy. EXAM: CT ABDOMEN AND PELVIS WITHOUT CONTRAST TECHNIQUE: Multidetector CT imaging of the abdomen and pelvis was performed following the standard protocol without IV contrast. COMPARISON:  None. FINDINGS: LOWER CHEST: LEFT breast skin thickening and possible ulceration. 4 mm RIGHT lower lobe, 5 mm RIGHT lower lobe subpleural nodule and 9 mm RIGHT middle lobe pulmonary nodule (series 6, image 1/29). Heart size is normal. HEPATOBILIARY: Status post cholecystectomy.  Normal liver. PANCREAS: Normal. SPLEEN: Normal. ADRENALS/URINARY TRACT: Kidneys are orthotopic, demonstrating normal size and morphology. No nephrolithiasis, hydronephrosis; limited assessment for renal masses on this nonenhanced examination. The unopacified ureters are normal in course and caliber. Urinary bladder is partially distended and unremarkable. Normal adrenal glands. STOMACH/BOWEL: Very small hiatal hernia. The stomach, small and large bowel are normal in course and caliber without inflammatory changes, sensitivity decreased by lack of enteric contrast. Moderate sigmoid, mild generalized colon diverticulosis. Normal appendix. VASCULAR/LYMPHATIC: Aortoiliac vessels are normal in course and caliber, trace calcific atherosclerosis. No lymphadenopathy by CT size criteria. REPRODUCTIVE: Normal. OTHER: No intraperitoneal free fluid or free air. MUSCULOSKELETAL: Ill-defined sclerotic lesion L2 without pathologic fracture. Mild sclerosis S1. Mildly expansile lesion LEFT L3 vertebral body  with suspected pathologic fracture. Nondisplaced probable pathologic fracture RIGHT T12 transverse process. Moderate sacroiliac osteoarthrosis. IMPRESSION: No acute intra-abdominal or pelvic process. LEFT breast skin thickening and probable ulceration, recommend direct inspection. Multiple metastasis in the  upper thoracic and lumbar spine included LEFT L3 transverse process and RIGHT T12 transverse pathologic fractures. Multiple pulmonary nodules measuring to 9 mm. Considering above findings, these are suspicious for metastasis. Acute findings discussed with and reconfirmed by PA NICOLE PISCIOTTA on 01/21/2017 at 3:29 am. Electronically Signed   By: Elon Alas M.D.   On: 01/21/2017 03:30   Mm Clip Placement Left  Result Date: 01/28/2017 CLINICAL DATA:  Patient status post ultrasound-guided biopsy left breast mass and left axillary lymph node. EXAM: DIAGNOSTIC LEFT MAMMOGRAM POST ULTRASOUND BIOPSY COMPARISON:  Previous exam(s). FINDINGS: Mammographic images were obtained following ultrasound guided biopsy of left breast mass and left axillary lymph node. Ribbon shaped marking clip appropriately located within the left breast mass. HydroMARK clip within the left axillary lymph node. IMPRESSION: Appropriate position biopsy marking clip status post ultrasound-guided biopsy left breast mass and left axillary lymph node. Final Assessment: Post Procedure Mammograms for Marker Placement Electronically Signed   By: Lovey Newcomer M.D.   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 This was found to be concordant by Dr. Lovey Newcomer. The patient reported doing well after the biopsies. Post biopsy instructions and care were reviewed and  questions were answered. The patient was encouraged to call [facility] for any additional concerns. The patient has an appointment with Dr. Sullivan Lone on February 01, 2017, and prefers to discuss the results with him at that time. Dr. Irene Limbo was notified of the patient's request. Pathology results reported by Susa Raring RN, BSN on 02/01/2017. Electronically Signed   By: Lovey Newcomer M.D.   On: 02/01/2017 10:12   Result Date: 02/07/2017 CLINICAL DATA:  Patient with ulcerated left breast mass for percutaneous biopsy. EXAM: ULTRASOUND GUIDED LEFT BREAST CORE NEEDLE BIOPSY COMPARISON:  Previous exam(s). FINDINGS: I met with the patient and we discussed the procedure of ultrasound-guided biopsy, including benefits and alternatives. We discussed the high likelihood of a successful procedure. We discussed the risks of the procedure, including infection, bleeding, tissue injury, clip migration, and inadequate sampling. Informed written consent was given. The usual time-out protocol was performed immediately prior to the procedure. Using sterile technique and 1% Lidocaine as local anesthetic, under direct ultrasound visualization, a 12 gauge spring-loaded device was used to perform biopsy of left breast mass 9 o'clock position using a medial approach. At the conclusion of the procedure a ribbon shaped tissue marker clip was deployed into the biopsy cavity. Follow up 2 view mammogram was performed and dictated separately. IMPRESSION: Ultrasound guided biopsy of left breast mass 9 o'clock position. No apparent complications. Electronically Signed: By: Lovey Newcomer M.D. 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 Multifocal tumor in the left breast with biopsy-proven left axillary metastases. Noted to have extensive or shows metastases and  pulmonary metastases. Patient is noted to have calvarial metastases but no overt parenchymal metastasis  Plan -The patient's diagnosis, prognosis, natural history of the disease and treatment options were discussed in extensive detail with the patient and her accompanying friend and family. -The had several  questions that were answered in details. -Patient is to be started on Ribociclib/Letrozole for 1st line therapy of metastatic ER/PR positive HER-2/neu negative breast cancer without any visceral crisis. -Baseline EKG and electrolytes. -Chemotherapy counseling -Return to clinic in about a week to 10 days after starting treatment for toxicity check.  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 -Has been given a referral to radiation oncology for palliative radiation to her significantly symptomatic bony metastases. -We'll start the patient on Xgeva.  #3 vitamin D deficiency -Aggressive treatment with ergocalciferol 50,000 units once weekly.  #4 cancer related pain -Palliative radiation to symptomatic spinal metastasis. -Continue OxyContin 20 mg by mouth twice a day with when necessary Dilaudid for breakthrough pain. -Hopefully we can back off on her narcotics as her pain is better controlled with control of her breast cancer.  #5 depression with anxiety -Was started on Lexapro but would hold off at this time due to concern with daily prolongation with her Ribociclib.  #6 family history of breast cancer -We did discuss about genetic testing and referral. -Patient is currently overwhelmed with her diagnosis and wants to start off on treatment and give it some time before considering genetic counseling and testing which is quite reasonable.  #7 Smoking  - patient notes that she has not quit smoking.  Recommended she set up a primary care physician .   Labs and Xgeva shot  on Friday 02/01/2017 US venous b/l to r/o DVT in 1-2 days Urgent Radiation oncology referral - urgent 1-3 days (consideration of palliative radiation to spine mets T12/L3 for significant pain) -dietician next week -chemo-counseling next week -RTC with Dr Irene Limbo 02/14/2017 for toxicity check with labs  All of the patients questions were answered with apparent satisfaction. The patient knows to call the clinic with any problems, questions or concerns.  I spent 45 minutes counseling the patient face to face. The total time spent in the appointment was 60 minutes and more than 50% was on counseling and direct patient cares.    Sullivan Lone MD [place] AAHIVMS [facility] Hematology/Oncology Physician [facility]  (Office):       [phone] (Work cell):  [phone] (Fax):           [phone]  02/13/2017 3:21 PM

## 2017-02-14 ENCOUNTER — Other Ambulatory Visit (HOSPITAL_COMMUNITY)
Admission: RE | Admit: 2017-02-14 | Discharge: 2017-02-14 | Disposition: A | Discharge status: Home / Self Care | Payer: BC Managed Care – PPO | Source: Ambulatory Visit | Attending: Hematology | Admitting: Hematology

## 2017-02-14 ENCOUNTER — Other Ambulatory Visit: Payer: Self-pay | Admitting: *Deleted

## 2017-02-14 ENCOUNTER — Ambulatory Visit
Admission: RE | Admit: 2017-02-14 | Discharge: 2017-02-14 | Disposition: A | Discharge status: Home / Self Care | Payer: BC Managed Care – PPO | Source: Ambulatory Visit | Attending: Radiation Oncology | Admitting: Radiation Oncology

## 2017-02-14 ENCOUNTER — Telehealth: Payer: Self-pay | Admitting: Hematology

## 2017-02-14 ENCOUNTER — Telehealth: Payer: Self-pay | Admitting: Radiation Oncology

## 2017-02-14 ENCOUNTER — Other Ambulatory Visit (HOSPITAL_BASED_OUTPATIENT_CLINIC_OR_DEPARTMENT_OTHER): Payer: BC Managed Care – PPO

## 2017-02-14 ENCOUNTER — Other Ambulatory Visit: Payer: Self-pay

## 2017-02-14 ENCOUNTER — Ambulatory Visit (HOSPITAL_BASED_OUTPATIENT_CLINIC_OR_DEPARTMENT_OTHER): Payer: BC Managed Care – PPO | Admitting: Hematology

## 2017-02-14 VITALS — BP 139/76 | HR 56 | Temp 98.1°F | Resp 18 | Ht 65.0 in | Wt 174.5 lb

## 2017-02-14 DIAGNOSIS — C773 Secondary and unspecified malignant neoplasm of axilla and upper limb lymph nodes: Secondary | ICD-10-CM | POA: Diagnosis not present

## 2017-02-14 DIAGNOSIS — G893 Neoplasm related pain (acute) (chronic): Secondary | ICD-10-CM

## 2017-02-14 DIAGNOSIS — C7800 Secondary malignant neoplasm of unspecified lung: Secondary | ICD-10-CM

## 2017-02-14 DIAGNOSIS — E559 Vitamin D deficiency, unspecified: Secondary | ICD-10-CM

## 2017-02-14 DIAGNOSIS — Z803 Family history of malignant neoplasm of breast: Secondary | ICD-10-CM

## 2017-02-14 DIAGNOSIS — C50912 Malignant neoplasm of unspecified site of left female breast: Secondary | ICD-10-CM

## 2017-02-14 DIAGNOSIS — C7951 Secondary malignant neoplasm of bone: Secondary | ICD-10-CM | POA: Diagnosis not present

## 2017-02-14 DIAGNOSIS — Z170 Estrogen receptor positive status [ER+]: Secondary | ICD-10-CM

## 2017-02-14 DIAGNOSIS — C50919 Malignant neoplasm of unspecified site of unspecified female breast: Secondary | ICD-10-CM

## 2017-02-14 LAB — COMPREHENSIVE METABOLIC PANEL
ALT: 32 U/L (ref 0–55)
AST: 15 U/L (ref 5–34)
Albumin: 4 g/dL (ref 3.5–5.0)
Alkaline Phosphatase: 182 U/L — ABNORMAL HIGH (ref 40–150)
Anion Gap: 9 mEq/L (ref 3–11)
BUN: 17.1 mg/dL (ref 7.0–26.0)
CALCIUM: 8.7 mg/dL (ref 8.4–10.4)
CHLORIDE: 101 meq/L (ref 98–109)
CO2: 25 mEq/L (ref 22–29)
CREATININE: 1.1 mg/dL (ref 0.6–1.1)
EGFR: 61 mL/min/{1.73_m2} — ABNORMAL LOW (ref >90)
GLUCOSE: 162 mg/dL — AB (ref 70–140)
Potassium: 4.4 mEq/L (ref 3.5–5.1)
SODIUM: 136 meq/L (ref 136–145)
Total Bilirubin: 0.92 mg/dL (ref 0.20–1.20)
Total Protein: 7.3 g/dL (ref 6.4–8.3)

## 2017-02-14 LAB — CBC & DIFF AND RETIC
BASO%: 0 % (ref 0.0–2.0)
BASOS ABS: 0 10*3/uL (ref 0.0–0.1)
EOS%: 0 % (ref 0.0–7.0)
Eosinophils Absolute: 0 10*3/uL (ref 0.0–0.5)
HEMATOCRIT: 43.1 % (ref 34.8–46.6)
HGB: 14.2 g/dL (ref 11.6–15.9)
Immature Retic Fract: 7.4 % (ref 1.60–10.00)
LYMPH%: 3.2 % — ABNORMAL LOW (ref 14.0–49.7)
MCH: 28.1 pg (ref 25.1–34.0)
MCHC: 32.9 g/dL (ref 31.5–36.0)
MCV: 85.3 fL (ref 79.5–101.0)
MONO#: 0.6 10*3/uL (ref 0.1–0.9)
MONO%: 4.3 % (ref 0.0–14.0)
NEUT%: 92.5 % — ABNORMAL HIGH (ref 38.4–76.8)
NEUTROS ABS: 11.9 10*3/uL — AB (ref 1.5–6.5)
Platelets: 289 10*3/uL (ref 145–400)
RBC: 5.05 10*6/uL (ref 3.70–5.45)
RDW: 14.9 % — AB (ref 11.2–14.5)
RETIC CT ABS: 101 10*3/uL — AB (ref 33.70–90.70)
Retic %: 2 % (ref 0.70–2.10)
WBC: 12.9 10*3/uL — ABNORMAL HIGH (ref 3.9–10.3)
lymph#: 0.4 10*3/uL — ABNORMAL LOW (ref 0.9–3.3)

## 2017-02-14 LAB — MAGNESIUM: Magnesium: 3.3 mg/dl — ABNORMAL HIGH (ref 1.5–2.5)

## 2017-02-14 LAB — PHOSPHORUS: Phosphorus: 2.1 mg/dL — ABNORMAL LOW (ref 2.5–4.6)

## 2017-02-14 MED ORDER — ONDANSETRON HCL 4 MG PO TABS: 4.0000 mg | ORAL_TABLET | Freq: Three times a day (TID) | ORAL | 0 refills | Status: DC | PRN

## 2017-02-14 MED ORDER — DEXAMETHASONE 4 MG PO TABS: 4.0000 mg | ORAL_TABLET | Freq: Every day | ORAL | 0 refills | Status: DC

## 2017-02-14 NOTE — Telephone Encounter (Signed)
Received voicemail message from patient requesting return call. Phoned patient back. She states, "I had a question about my nausea medication but, I figured it out." Patient confirms she has no other additional needs at this time and expressed appreciation for the return call.

## 2017-02-14 NOTE — Telephone Encounter (Signed)
Gave patient avs report and appointments for April  °

## 2017-02-14 NOTE — Patient Instructions (Signed)
Thank you for choosing [facility] to provide your oncology and hematology care.  To afford each patient quality time with our providers, please arrive 30 minutes before your scheduled appointment time.  If you arrive late for your appointment, you may be asked to reschedule.  We strive to give you quality time with our providers, and arriving late affects you and other patients whose appointments are after yours.  If you are a no show for multiple scheduled visits, you may be dismissed from the clinic at the providers discretion.   Again, thank you for choosing [facility], our hope is that these requests will decrease the amount of time that you wait before being seen by our physicians.  ______________________________________________________________________ Should you have questions after your visit to the [facility], please contact our office at [phone] between the hours of 8:30 and 4:30 p.m.    Voicemails left after 4:30p.m will not be returned until the following business day.   For prescription refill requests, please have your pharmacy contact us directly.  Please also try to allow 48 hours for prescription requests.   Please contact the scheduling department for questions regarding scheduling.  For scheduling of procedures such as PET scans, CT scans, MRI, Ultrasound, etc please contact central scheduling at [phone].   Resources For Cancer Patients and Caregivers:  American Cancer Society:  800-227-2345  Can help patients locate various types of support and financial assistance Cancer Care: 1-800-813-HOPE (4673) Provides financial assistance, online support groups, medication/co-pay assistance.   [place] DSS:  336-641-3447 Where to apply for food stamps, Medicaid, and utility assistance Medicare Rights Center: 800-333-4114 Helps people with Medicare understand their rights and benefits, navigate the Medicare system, and secure the  quality healthcare they deserve SCAT: 336-333-6589 [place] [employer]'s shared-ride transportation service for eligible riders who have a disability that prevents them from riding the fixed route bus.   For additional information on assistance programs please contact our social worker:   Grier Hock/Abigail Elmore:  [phone] 

## 2017-02-15 ENCOUNTER — Ambulatory Visit
Admission: RE | Admit: 2017-02-15 | Discharge: 2017-02-15 | Disposition: A | Discharge status: Home / Self Care | Payer: BC Managed Care – PPO | Source: Ambulatory Visit | Attending: Radiation Oncology | Admitting: Radiation Oncology

## 2017-02-15 DIAGNOSIS — C7951 Secondary malignant neoplasm of bone: Secondary | ICD-10-CM | POA: Diagnosis not present

## 2017-02-15 MED ORDER — RIBOCICLIB & LETROZOLE 200 & 2.5 MG PO TBPK: 1.0000 | ORAL_TABLET | ORAL | 2 refills | Status: DC

## 2017-02-15 NOTE — Telephone Encounter (Signed)
Oral Chemotherapy Pharmacist Encounter  Received notification from [facility] that prior authorization of patient's [place]isq[place]/Femara copack has been approved Effective dates: 02/10/17-02/10/18  It is noted on PA approval that prescription must be filled at a [facility]. I will e-scribe to [facility] in [place], [place].  Oral Oncology Clinic will continue to follow.  Johny Drilling, PharmD, BCPS, BCOP 02/15/2017  4:10 PM Oral Oncology Clinic [phone]

## 2017-02-16 ENCOUNTER — Encounter: Payer: Self-pay | Admitting: Hematology

## 2017-02-16 ENCOUNTER — Other Ambulatory Visit: Payer: Self-pay | Admitting: *Deleted

## 2017-02-16 ENCOUNTER — Other Ambulatory Visit: Payer: Self-pay | Admitting: Hematology

## 2017-02-16 ENCOUNTER — Ambulatory Visit
Admission: RE | Admit: 2017-02-16 | Discharge: 2017-02-16 | Disposition: A | Discharge status: Home / Self Care | Payer: BC Managed Care – PPO | Source: Ambulatory Visit | Attending: Radiation Oncology | Admitting: Radiation Oncology

## 2017-02-16 DIAGNOSIS — C7951 Secondary malignant neoplasm of bone: Secondary | ICD-10-CM | POA: Diagnosis not present

## 2017-02-16 MED ORDER — DEXAMETHASONE 4 MG PO TABS: 4.0000 mg | ORAL_TABLET | Freq: Every day | ORAL | 0 refills | Status: DC

## 2017-02-16 NOTE — Progress Notes (Signed)
Received PA request for Zofran.  Submitted request via Cover My Meds:  Stacey Cox (Key: [id number])   Your information has been submitted to [place]. [employer] [place] will review the request and fax you a determination directly, typically within 3 business days of your submission once all necessary information is received. If [employer]  has not responded in 3 business days or if you have any questions about your submission, contact [place] at [phone].

## 2017-02-16 NOTE — Progress Notes (Signed)
Received denial from [facility] for Ondansetron due to rx's not covered under that plan.  Called pharmacy number on the back of the card which was listed as [facility] to only be given the number to [facility]. Called [facility][email or web address] [phone] and spoke with [facility] whom asked a few questions and states the Josem Kaufmann is approved for 6 months and a fax should be coming with the approval. [employer]@336 [phone] and spoke with Verline Lema whom states it was sent to another store and had a paid claim on 02/12/17.    Deforest Hoyles RN information provided by pharmacy. She called the patient to advise of approval for Ondansetron and called the pharmacy to confirm it would be available for patient. She was told her refill should go through tomorrow for the Ondansetron.

## 2017-02-16 NOTE — Progress Notes (Signed)
Stacey Cox    HEMATOLOGY/ONCOLOGY CLINIC NOTE  Date of Service: 02/14/2017 Patient Care Team: No Pcp Per Patient as PCP - General (General Practice)  CHIEF COMPLAINTS/PURPOSE OF CONSULTATION:  Newly diagnosed metastatic breast cancer   HISTORY OF PRESENTING ILLNESS:   Stacey Cox is a wonderful 61 y.o. female who has been referred to [place] by Dr Lucious Groves, DO  for evaluation and management of likely metastatic breast cancer.  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 She also notes having developed left lower inner breast discomfort and  Ulceration over the last several weeks.  Patient has a CT abd renal proctocol to R/o kidney stones that showed Multiple metastasis in the thoracic and lumbar spine included LEFT L3 transverse process and RIGHT T12 transverse pathologic fractures. Multiple pulmonary nodules measuring to 9 mm.   Patient notes no weight loss no night sweat. Last mammogram >10 yrs ago.  No headaches no overt FND FHx of breast cancer in her mother and sister.  Patient noted that she has a phobia of the medical system and therefore had not actively sought a PCP or routine cares. She was anxious about how she how be able to management her live "alone". Supportive counseling was provided.  A CT chest, bone scan and MRI brain were recommended to complete staging workup.  CT chest was done which showed Primary left breast cancer with left axillary and subpectoral adenopathy.  Metastatic disease in the thorax with multiple pulmonary nodules mild mediastinal adenopathy. Left hilar soft tissue prominence may be central pulmonary nodule or adenopathy. Osseous metastatic disease with mild T6 compression fracture with lucent lesion anteriorly and lucencies in the posterior elements of T9 through T12. Fracture of right anterior rib has surrounding callus is likely a pathologic  fracture.  Bone scan was done which showed significant osseous metastatic disease involving spine, calvarium and rt ribs cage.  Patient was admitted for symptoms control and expedited workup. She has been seen by Surgery.  Patient subsequently has had mammogram which showed Within the medial aspect of the left breast there is a 4.1 cm ulcerated mass. Slightly anterior to this within the medial left breast middle depth is a 2.3 cm oval lobular mass. Within the upper inner left breast there is a 1.4 cm mass with associated distortion. Within the slightly medial right breast middle depth there is a 0.8 m oval circumscribed mass. Multiple prominent left axillary lymph nodes are demonstrated on mammography.  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 No DCIS noted. Lymph node was positive for metastatic carcinoma consistent with breast primary.  Patient notes difficult to control lower back pain despite OxyContin and when necessary oxycodone. She is agreeable to getting a referral to radiation oncology for palliative radiation to her significantly symptomatic spinal metastases.  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 She is here with her sister friend and colleague from work. She appears to be much brighter than before. She notes that her back pain is getting much better controlled. She shall be finishing her palliative radiation to the back this Friday. We discussed cutting back on her steroids after completion of radiation. We discussed her  pain management medication adjustments to try to wean gradually of her long-acting narcotics and then the short-acting narcotics. No other acute new symptoms.  MEDICAL HISTORY:  Past Medical  History:  Diagnosis Date  . Cancer [facility])    Metastatic Breast Cancer    SURGICAL HISTORY: Past Surgical History:  Procedure Laterality Date  . CHOLECYSTECTOMY    . TONSILLECTOMY      SOCIAL HISTORY: Social History   Social History  . Marital status: Single    Spouse name: N/A  . Number of children: N/A  . Years of education: N/A   Occupational History  . Not on file.   Social History Main Topics  . Smoking status: Former Smoker    Packs/day: 0.00    Types: Cigarettes    Quit date: 01/21/2017  . Smokeless tobacco: Never Used  . Alcohol use No  . Drug use: No  . Sexual activity: No   Other Topics Concern  . Not on file   Social History Narrative  . No narrative on file  Cigarette smoker 1/2PPD for about 40 yrs Social alcohol use No drugs Professor at [employer] no children  FAMILY HISTORY: Family History  Problem Relation Age of Onset  . Cancer Mother     breast  . Cancer Sister     breast  Mother with h/o breast cancer in her 53's Sister with breast cancer in her 67ys and later was diagnosed with multiple myeloma. (Patient is not aware of any specific breast cancer mutations present)  ALLERGIES:  is allergic to thorazine [chlorpromazine].  MEDICATIONS:  Current Outpatient Prescriptions  Medication Sig Dispense Refill  . dexamethasone (DECADRON) 4 MG tablet Take 1 tablet (4 mg total) by mouth daily. With breakfast 15 tablet 0  . dexamethasone (DECADRON) 4 MG tablet Take 1 tablet (4 mg total) by mouth daily. With breakfast 5 tablet 0  . ergocalciferol (VITAMIN D2) 50000 units capsule Take 1 capsule (50,000 Units total) by mouth once a week. 12 capsule 0  . HYDROmorphone (DILAUDID) 4 MG tablet Take 1 tablet (4 mg total) by mouth every 3 (three) hours as needed for severe pain. 60 tablet 0  . LORazepam (ATIVAN) 0.5 MG tablet Take 1 tablet (0.5 mg total) by mouth every 6 (six) hours as needed for anxiety. 60 tablet 0  . ondansetron (ZOFRAN) 4 MG tablet  Take 1 tablet (4 mg total) by mouth every 8 (eight) hours as needed for nausea or vomiting. 30 tablet 0  . oxyCODONE (OXYCONTIN) 20 mg 12 hr tablet Take 1 tablet (20 mg total) by mouth every 12 (twelve) hours. 60 tablet 0  . polyethylene glycol (MIRALAX) packet Take 17 g by mouth daily. (Patient not taking: Reported on 02/05/2017) 30 each 1  . Ribociclib & Letrozole (KISQALI FEMARA 600 DOSE) 200 & 2.5 MG TBPK Take 1 each by mouth See admin instructions. Ribociclib 663m 3weeks on 1 week off. Letrozole 2.561mpo daily without interruption. 49 each 2  . senna-docusate (SENNA S) 8.6-50 MG tablet Take 2 tablets by mouth 2 (two) times daily. 60 tablet 1   No current facility-administered medications for this visit.     REVIEW OF SYSTEMS:    10 Point review of Systems was done is negative except as noted above.  PHYSICAL EXAMINATION: ECOG PERFORMANCE STATUS: 1 - Symptomatic but completely ambulatory  . Vitals:   02/14/17 1431  BP: 139/76  Pulse: (!) 56  Resp: 18  Temp: 98.1 F (36.7 C)   Filed  Weights   02/14/17 1431  Weight: 174 lb 8 oz (79.2 kg)   .Body mass index is 29.04 kg/m.  GENERAL:alert, in no acute distress and comfortable SKIN: no acute rashes, no significant lesions EYES: conjunctiva are pink and non-injected, sclera anicteric OROPHARYNX: MMM, no exudates, no oropharyngeal erythema or ulceration NECK: supple, no JVD LYMPH:  no palpable lymphadenopathy in the cervical, axillary or inguinal regions LUNGS: clear to auscultation b/l with normal respiratory effort HEART: regular rate & rhythm ABDOMEN:  normoactive bowel sounds , non tender, not distended. Extremity: no pedal edema PSYCH: alert & oriented x 3 with fluent speech NEURO: no focal motor/sensory deficits Breast examination was done recently in the hospital and not repeated today  LABORATORY DATA:  I have reviewed the data as listed  . CBC Latest Ref Rng & Units 02/14/2017 02/07/2017 01/31/2017  WBC 3.9 - 10.3  10e3/uL 12.9(H) 11.9(H) 9.6  Hemoglobin 11.6 - 15.9 g/dL 14.2 13.3 12.5  Hematocrit 34.8 - 46.6 % 43.1 39.5 37.6  Platelets 145 - 400 10e3/uL 289 300 231    . CMP Latest Ref Rng & Units 02/14/2017 02/07/2017 01/31/2017  Glucose 70 - 140 mg/dl 162(H) 145(H) 101(H)  BUN 7.0 - 26.0 mg/dL 17.1 17.3 14  Creatinine 0.6 - 1.1 mg/dL 1.1 0.9 0.94  Sodium 136 - 145 mEq/L 136 139 133(L)  Potassium 3.5 - 5.1 mEq/L 4.4 3.7 3.8  Chloride 101 - 111 mmol/L - - 99(L)  CO2 22 - 29 mEq/L 25 24 25   Calcium 8.4 - 10.4 mg/dL 8.7 9.5 9.6  Total Protein 6.4 - 8.3 g/dL 7.3 7.2 7.1  Total Bilirubin 0.20 - 1.20 mg/dL 0.92 0.37 0.6  Alkaline Phos 40 - 150 U/L 182(H) 135 127(H)  AST 5 - 34 U/L 15 29 35  ALT 0 - 55 U/L 32 42 46   Component     Latest Ref Rng & Units 01/21/2017 02/07/2017  HIV     Non Reactive Non Reactive   CA 15-3     0.0 - 25.0 U/mL 32.4 (H)   CA 27.29     0.0 - 38.6 U/mL 37.6   Vitamin D, 25-Hydroxy     30.0 - 100.0 ng/mL  14.2 (L)  Phosphorus     2.5 - 4.5 mg/dL  3.5            RADIOGRAPHIC STUDIES: I have personally reviewed the radiological images as listed and agreed with the findings in the report. Dg Cervical Spine 2 Or 3 Views  Result Date: 01/21/2017 CLINICAL DATA:  Findings of metastatic disease on recent CT examination with neck pain EXAM: CERVICAL SPINE - 3 VIEW COMPARISON:  None. FINDINGS: Six cervical segments are well visualized on this exam. The seventh cervical segment is only partially visualized. No compression deformities are seen. Facet hypertrophic changes are noted commenced with the patient's age. The odontoid is within normal limits. IMPRESSION: Degenerative change without sclerosis to suggest metastatic disease. If clinically indicated MRI or bone scan would be much more sensitive for occult bony metastatic disease Electronically Signed   By: Inez Catalina M.D.   On: 01/21/2017 10:44   Dg Thoracic Spine 2 View  Result Date: 01/21/2017 CLINICAL DATA:  Abnormal  CT EXAM: THORACIC SPINE 2 VIEWS COMPARISON:  CT from earlier in the same day FINDINGS: Pedicles are within normal limits. Vertebral body height is well maintained with the exception of T6 which demonstrates very mild compression deformity. This is of uncertain significance. MRI would be helpful  for further evaluation as necessary. No other compression deformities are seen. Mild osteophytic changes are noted. The previously seen changes of T12 are not well appreciated on this exam. IMPRESSION: T6 compression deformity of a mild degree. This may be chronic in nature as no prior films are available for comparison. MRI may be helpful for further evaluation as clinically necessary. Electronically Signed   By: Inez Catalina M.D.   On: 01/21/2017 10:42   Dg Lumbar Spine 2-3 Views  Result Date: 01/21/2017 CLINICAL DATA:  Abnormal CT showing metastatic disease at L2 EXAM: LUMBAR SPINE - 2-3 VIEW COMPARISON:  None. FINDINGS: Vertebral body height is well maintained. Mild sclerosis is noted in the posterior inferior aspect of L2 corresponding to that seen on prior CT examination. Mild osteophytic changes are noted. IMPRESSION: L2 metastatic disease similar to that seen on recent CT. Electronically Signed   By: Inez Catalina M.D.   On: 01/21/2017 10:43   Ct Chest Wo Contrast  Result Date: 01/21/2017 CLINICAL DATA:  Metastatic breast cancer. Thoraco lumbar metastasis on bone scan. Right rib activity on bone scan. EXAM: CT CHEST WITHOUT CONTRAST TECHNIQUE: Multidetector CT imaging of the chest was performed following the standard protocol without IV contrast. COMPARISON:  Bone scan earlier this day. Included lung bases from CT abdomen/ pelvis earlier this day. FINDINGS: Cardiovascular: Mild atherosclerosis of the thoracic aorta. The heart is normal in size. A few coronary artery calcifications are seen. Mediastinum/Nodes: 15 mm subcarinal node. 11 mm lower paratracheal node. 8 mm right upper paratracheal node. Small bilateral  supraclavicular nodes. Multiple enlarged small left axillary lymph nodes measure up to 14 mm. There prominent left subpectoral nodes. Limited assessment for hilar adenopathy given lack of IV contrast, there is left hilar prominence. No enlarged right axillary lymph nodes. The esophagus is decompressed. Visualized thyroid gland is normal. Lungs/Pleura: Multifocal pulmonary nodules consistent with metastatic disease. These involve all lobes of both lungs. For example paramediastinal right middle lobe nodule measures 10 mm, series 205 image 56 left upper lobe perifissural nodule measures 13 x 8 mm image 35. Central left upper lobe nodule versus adenopathy with surrounding spiculation. Upper Abdomen: Cholecystectomy clips. No adrenal nodule. Patient had CT abdomen/ pelvis earlier this day. Musculoskeletal: Irregular medial left breast lesion measures approximately 3 cm. There is diffuse left breast skin thickening and probable ulceration. Probable additional left breast nodule in the region of skin thickening. There is callus formation about right lateral eighth rib. Lucency within right T12 transverse process. There is equivocal diminished density T11, T10, and T9 right transverse processes. Minimal anterior compression of T6 with likely underlying lucent lesion. There is no evidence of epidural tumor extension. IMPRESSION: 1. Primary left breast cancer with left axillary and subpectoral adenopathy. 2. Metastatic disease in the thorax with multiple pulmonary nodules mild mediastinal adenopathy. Left hilar soft tissue prominence may be central pulmonary nodule or adenopathy. 3. Osseous metastatic disease with mild T6 compression fracture with lucent lesion anteriorly and lucencies in the posterior elements of T9 through T12. Fracture of right anterior rib has surrounding callus is likely a pathologic fracture. Electronically Signed   By: Jeb Levering M.D.   On: 01/21/2017 21:55   Mr Brain Wo Contrast  Result  Date: 01/22/2017 CLINICAL DATA:  Newly diagnosed metastatic left breast cancer. EXAM: MRI HEAD WITHOUT CONTRAST TECHNIQUE: Multiplanar, multiecho pulse sequences of the brain and surrounding structures were obtained without intravenous contrast. COMPARISON:  Nuclear medicine whole-body bone scan 01/21/2017 FINDINGS: The examination was ordered without  IV contrast, and cerebral volume is within normal limits for age. The patient is severely claustrophobic. Brain: There is no evidence of acute infarct, intracranial hemorrhage, midline shift, or extra-axial fluid collection. A few small foci of T2 hyperintensity in the cerebral white matter are nonspecific and not greater than expected for patient's age. There is advanced cerebellar atrophy. Cerebral volume is within normal limits. There is a rounded 11 mm mass in the posterior fossa located along the anteroinferior margin of the right cerebellar hemisphere just posterior to the jugular bulb. This appears extra-axial in location and does not result in cerebellar mass effect. There is also a 6 mm nodular T2 hypointense extra-axial focus in the right aspect of the prepontine cistern just posterior to the right cavernous sinus. No definite cerebral mass lesions are identified, however sensitivity is reduced by the lack of IV contrast. There is no evidence of vasogenic edema. There is evidence of mild dural thickening versus a tiny subdural collection in the left occipital region measuring 2-3 mm in thickness without associated mass effect. Vascular: Major intracranial vascular flow voids are preserved. Skull and upper cervical spine: Large area of confluent abnormal bone marrow signal in the left occipital skull overlying the area of dural thickening/ tiny collection described above and corresponding to the abnormal uptake on recent nuclear medicine bone scan. A smaller lesion is present in the right parietal skull. Sinuses/Orbits: Unremarkable orbits. Paranasal sinuses  and mastoid air cells are clear. Other: None. IMPRESSION: 1. 11 mm extra-axial mass in the posterior fossa. Appearance favors a small meningioma over metastasis. 2. 6 mm nodule in the prepontine cistern, indeterminate. This could reflect an additional small meningioma. This is close to but not clearly contiguous with the right cavernous ICA to strongly suggest aneurysm. Postcontrast brain MRI is recommended for further evaluation. 3. Osseous skull metastases, including a large left occipital lobe lesion. Underlying thin extra-axial signal abnormality is favored to reflect mild dural thickening (either reactive or due to tumor infiltration) though a tiny subdural fluid collection/hematoma is also possible. This can be further evaluated on postcontrast MRI. 4. No definite parenchymal brain metastases, however evaluation is limited by lack of IV contrast. 5. Advanced cerebellar atrophy. Electronically Signed   By: Logan Bores M.D.   On: 01/22/2017 09:23   Nm Bone Scan Whole Body  Result Date: 01/21/2017 CLINICAL DATA:  Metastatic breast carcinoma EXAM: NUCLEAR MEDICINE WHOLE BODY BONE SCAN TECHNIQUE: Whole body anterior and posterior images were obtained approximately 3 hours after intravenous injection of radiopharmaceutical. RADIOPHARMACEUTICALS:  22.3 mCi Technetium-86mMDP IV COMPARISON:  CT from earlier in the same day FINDINGS: Adequate uptake of radioactive tracer is noted throughout the bony skeleton. Bilateral renal activity is noted. There multifocal areas of increased activity identified within the thoracic and lumbar spine at what appear to be T8, T10, T11 and T12 as well as L2 and L3. Some of these changes correspond to those seen on recent CT examination. Additionally there is increased activity in the lateral and anterior right rib cage which correspond to healing fractures in the right ribcage. Scattered degenerative changes are noted particularly in the feet bilaterally. Increased activity is noted  in the left posterior calvarium highly suspicious for metastatic lesion. This was not well appreciated on recent plain film examination of the cervical spine. IMPRESSION: Changes consistent with multifocal metastatic disease throughout the lower thoracic and lumbar spine as described. Increased activity is noted within the right ribcage laterally consistent with healing fractures. These may be pathologic  in nature. Calvarial metastatic disease is noted on the left posteriorly. Electronically Signed   By: Inez Catalina M.D.   On: 01/21/2017 15:51   US Breast Ltd Uni Left Inc Axilla  Result Date: 01/28/2017 CLINICAL DATA:  Patient presents for evaluation of ulcerated left breast mass. EXAM: 2D DIGITAL DIAGNOSTIC BILATERAL MAMMOGRAM WITH CAD AND ADJUNCT TOMO ULTRASOUND BILATERAL BREAST COMPARISON:  None ACR Breast Density Category b: There are scattered areas of fibroglandular density. FINDINGS: Within the medial aspect of the left breast there is a 4.1 cm ulcerated mass. Slightly anterior to this within the medial left breast middle depth is a 2.3 cm oval lobular mass. Within the upper inner left breast there is a 1.4 cm mass with associated distortion. Within the slightly medial right breast middle depth there is a 0.8 cm oval circumscribed mass. Multiple prominent left axillary lymph nodes are demonstrated on mammography. Mammographic images were processed with CAD. On physical exam, there is an ulcerated mass within the lower inner left breast. Additionally there is a palpable mass within the periareolar left breast. Targeted ultrasound is performed, showing an ill-defined difficult to evaluate mass within the left breast 9 o'clock position 5 cm from the nipple with superficial ulceration. This mass measures up to approximately 3 x 3 x 3.4 cm. Within the left breast 9 o'clock position 1 cm from the nipple there is a 3.7 x 2.9 x 1.6 cm irregular hypoechoic mass which appears to have invade the adjacent skin.  Within left breast 10 o'clock position 3 cm from nipple there is a 1.2 x 1.0 x 1.4 cm irregular taller than wide mixed echogenicity mass. There are least 4 cortically thickened enlarged left axillary lymph nodes measuring up to 1 cm in thickness. Within the right breast 2 o'clock position 5 cm from nipple there is a 5 x 3 x 6 mm simple cyst, corresponding with mammographically identified mass. IMPRESSION: Ulcerated mass within the medial left breast most compatible with primary breast malignancy. There are 2 additional suspicious masses within the left breast concerning for additional sites of disease. Multiple cortically thickened left axillary lymph nodes concerning for axillary metastasis. RECOMMENDATION: Ultrasound-guided core needle biopsy large ulcerated left breast mass. Ultrasound-guided core needle biopsy cortically thickened left axillary lymph node. Depending upon treatment plan, the patient may need biopsies of the additional suspicious left breast masses. I have discussed the findings and recommendations with the patient. Results were also provided in writing at the conclusion of the visit. If applicable, a reminder letter will be sent to the patient regarding the next appointment. BI-RADS CATEGORY  5: Highly suggestive of malignancy. Electronically Signed   By: Lovey Newcomer M.D.   On: 01/28/2017 13:17   US Breast Ltd Uni Right Inc Axilla  Result Date: 01/28/2017 CLINICAL DATA:  Patient presents for evaluation of ulcerated left breast mass. EXAM: 2D DIGITAL DIAGNOSTIC BILATERAL MAMMOGRAM WITH CAD AND ADJUNCT TOMO ULTRASOUND BILATERAL BREAST COMPARISON:  None ACR Breast Density Category b: There are scattered areas of fibroglandular density. FINDINGS: Within the medial aspect of the left breast there is a 4.1 cm ulcerated mass. Slightly anterior to this within the medial left breast middle depth is a 2.3 cm oval lobular mass. Within the upper inner left breast there is a 1.4 cm mass with associated  distortion. Within the slightly medial right breast middle depth there is a 0.8 cm oval circumscribed mass. Multiple prominent left axillary lymph nodes are demonstrated on mammography. Mammographic images were processed with CAD. On  physical exam, there is an ulcerated mass within the lower inner left breast. Additionally there is a palpable mass within the periareolar left breast. Targeted ultrasound is performed, showing an ill-defined difficult to evaluate mass within the left breast 9 o'clock position 5 cm from the nipple with superficial ulceration. This mass measures up to approximately 3 x 3 x 3.4 cm. Within the left breast 9 o'clock position 1 cm from the nipple there is a 3.7 x 2.9 x 1.6 cm irregular hypoechoic mass which appears to have invade the adjacent skin. Within left breast 10 o'clock position 3 cm from nipple there is a 1.2 x 1.0 x 1.4 cm irregular taller than wide mixed echogenicity mass. There are least 4 cortically thickened enlarged left axillary lymph nodes measuring up to 1 cm in thickness. Within the right breast 2 o'clock position 5 cm from nipple there is a 5 x 3 x 6 mm simple cyst, corresponding with mammographically identified mass. IMPRESSION: Ulcerated mass within the medial left breast most compatible with primary breast malignancy. There are 2 additional suspicious masses within the left breast concerning for additional sites of disease. Multiple cortically thickened left axillary lymph nodes concerning for axillary metastasis. RECOMMENDATION: Ultrasound-guided core needle biopsy large ulcerated left breast mass. Ultrasound-guided core needle biopsy cortically thickened left axillary lymph node. Depending upon treatment plan, the patient may need biopsies of the additional suspicious left breast masses. I have discussed the findings and recommendations with the patient. Results were also provided in writing at the conclusion of the visit. If applicable, a reminder letter will be  sent to the patient regarding the next appointment. BI-RADS CATEGORY  5: Highly suggestive of malignancy. Electronically Signed   By: Lovey Newcomer M.D.   On: 01/28/2017 13:17   Mm Diag Breast Tomo Bilateral  Result Date: 01/28/2017 CLINICAL DATA:  Patient presents for evaluation of ulcerated left breast mass. EXAM: 2D DIGITAL DIAGNOSTIC BILATERAL MAMMOGRAM WITH CAD AND ADJUNCT TOMO ULTRASOUND BILATERAL BREAST COMPARISON:  None ACR Breast Density Category b: There are scattered areas of fibroglandular density. FINDINGS: Within the medial aspect of the left breast there is a 4.1 cm ulcerated mass. Slightly anterior to this within the medial left breast middle depth is a 2.3 cm oval lobular mass. Within the upper inner left breast there is a 1.4 cm mass with associated distortion. Within the slightly medial right breast middle depth there is a 0.8 cm oval circumscribed mass. Multiple prominent left axillary lymph nodes are demonstrated on mammography. Mammographic images were processed with CAD. On physical exam, there is an ulcerated mass within the lower inner left breast. Additionally there is a palpable mass within the periareolar left breast. Targeted ultrasound is performed, showing an ill-defined difficult to evaluate mass within the left breast 9 o'clock position 5 cm from the nipple with superficial ulceration. This mass measures up to approximately 3 x 3 x 3.4 cm. Within the left breast 9 o'clock position 1 cm from the nipple there is a 3.7 x 2.9 x 1.6 cm irregular hypoechoic mass which appears to have invade the adjacent skin. Within left breast 10 o'clock position 3 cm from nipple there is a 1.2 x 1.0 x 1.4 cm irregular taller than wide mixed echogenicity mass. There are least 4 cortically thickened enlarged left axillary lymph nodes measuring up to 1 cm in thickness. Within the right breast 2 o'clock position 5 cm from nipple there is a 5 x 3 x 6 mm simple cyst, corresponding with mammographically  identified mass. IMPRESSION: Ulcerated mass within the medial left breast most compatible with primary breast malignancy. There are 2 additional suspicious masses within the left breast concerning for additional sites of disease. Multiple cortically thickened left axillary lymph nodes concerning for axillary metastasis. RECOMMENDATION: Ultrasound-guided core needle biopsy large ulcerated left breast mass. Ultrasound-guided core needle biopsy cortically thickened left axillary lymph node. Depending upon treatment plan, the patient may need biopsies of the additional suspicious left breast masses. I have discussed the findings and recommendations with the patient. Results were also provided in writing at the conclusion of the visit. If applicable, a reminder letter will be sent to the patient regarding the next appointment. BI-RADS CATEGORY  5: Highly suggestive of malignancy. Electronically Signed   By: Lovey Newcomer M.D.   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 This was found to be concordant by Dr. Lovey Newcomer. The patient reported doing well after the biopsies. Post biopsy instructions and care were reviewed and questions were answered. The patient was encouraged to call [facility] for any additional concerns. The patient has an appointment with Dr. Sullivan Lone on February 01, 2017, and prefers to discuss the results with him at that time. Dr. Irene Limbo was notified of the patient's request. Pathology results reported by Susa Raring RN, BSN on 02/01/2017. Electronically Signed   By: Lovey Newcomer M.D.   On: 02/01/2017 10:12   Result Date: 02/07/2017 CLINICAL DATA:  Patient with enlarged left axillary lymph node. EXAM: ULTRASOUND  GUIDED CORE NEEDLE BIOPSY OF A LEFT AXILLARY NODE COMPARISON:  Previous exam(s). FINDINGS: I met with the patient and we discussed the procedure of ultrasound-guided biopsy, including benefits and alternatives. We discussed the high likelihood of a successful procedure. We discussed the risks of the procedure, including infection, bleeding, tissue injury, clip migration, and inadequate sampling. Informed written consent was given. The usual time-out protocol was performed immediately prior to the procedure. Using sterile technique and 1% Lidocaine as local anesthetic, under direct ultrasound visualization, a 14 gauge spring-loaded device was used to perform biopsy of cortically thickened left axillary lymph node using a lateral approach. At the conclusion of the procedure a HydroMARK tissue marker clip was deployed into the biopsy cavity. Follow up 2 view mammogram was performed and dictated separately. IMPRESSION: Ultrasound guided biopsy of a cortically thickened left axillary lymph node. No apparent complications. Electronically Signed: By: Lovey Newcomer M.D. On: 01/28/2017 12:15   Ct Renal Stone Study  Result Date: 01/21/2017 CLINICAL DATA:  Low back and pelvic pain for 1 week. History of cholecystectomy. EXAM: CT ABDOMEN AND PELVIS WITHOUT CONTRAST TECHNIQUE: Multidetector CT imaging of the abdomen and pelvis was performed following the standard protocol without IV contrast. COMPARISON:  None. FINDINGS: LOWER CHEST: LEFT breast skin thickening and possible ulceration. 4 mm RIGHT lower lobe, 5 mm RIGHT lower lobe subpleural nodule and 9 mm RIGHT middle lobe pulmonary nodule (series 6, image 1/29). Heart size is normal. HEPATOBILIARY: Status post cholecystectomy.  Normal liver. PANCREAS: Normal. SPLEEN: Normal. ADRENALS/URINARY TRACT: Kidneys are orthotopic, demonstrating normal size and morphology. No nephrolithiasis, hydronephrosis; limited assessment for renal masses on this nonenhanced examination. The  unopacified ureters are normal in course and caliber. Urinary bladder is partially distended and  unremarkable. Normal adrenal glands. STOMACH/BOWEL: Very small hiatal hernia. The stomach, small and large bowel are normal in course and caliber without inflammatory changes, sensitivity decreased by lack of enteric contrast. Moderate sigmoid, mild generalized colon diverticulosis. Normal appendix. VASCULAR/LYMPHATIC: Aortoiliac vessels are normal in course and caliber, trace calcific atherosclerosis. No lymphadenopathy by CT size criteria. REPRODUCTIVE: Normal. OTHER: No intraperitoneal free fluid or free air. MUSCULOSKELETAL: Ill-defined sclerotic lesion L2 without pathologic fracture. Mild sclerosis S1. Mildly expansile lesion LEFT L3 vertebral body with suspected pathologic fracture. Nondisplaced probable pathologic fracture RIGHT T12 transverse process. Moderate sacroiliac osteoarthrosis. IMPRESSION: No acute intra-abdominal or pelvic process. LEFT breast skin thickening and probable ulceration, recommend direct inspection. Multiple metastasis in the upper thoracic and lumbar spine included LEFT L3 transverse process and RIGHT T12 transverse pathologic fractures. Multiple pulmonary nodules measuring to 9 mm. Considering above findings, these are suspicious for metastasis. Acute findings discussed with and reconfirmed by PA NICOLE PISCIOTTA on 01/21/2017 at 3:29 am. Electronically Signed   By: Elon Alas M.D.   On: 01/21/2017 03:30   Mm Clip Placement Left  Result Date: 01/28/2017 CLINICAL DATA:  Patient status post ultrasound-guided biopsy left breast mass and left axillary lymph node. EXAM: DIAGNOSTIC LEFT MAMMOGRAM POST ULTRASOUND BIOPSY COMPARISON:  Previous exam(s). FINDINGS: Mammographic images were obtained following ultrasound guided biopsy of left breast mass and left axillary lymph node. Ribbon shaped marking clip appropriately located within the left breast mass. HydroMARK clip within the left  axillary lymph node. IMPRESSION: Appropriate position biopsy marking clip status post ultrasound-guided biopsy left breast mass and left axillary lymph node. Final Assessment: Post Procedure Mammograms for Marker Placement Electronically Signed   By: Lovey Newcomer M.D.   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 This was found to be concordant by Dr. Lovey Newcomer. The patient reported doing well after the biopsies. Post biopsy instructions and care were reviewed and questions were answered. The patient was encouraged to call [facility] for any additional concerns. The patient has an appointment with Dr. Sullivan Lone on February 01, 2017, and prefers to discuss the results with him at that time. Dr. Irene Limbo was notified of the patient's request. Pathology results reported by Susa Raring RN, BSN on 02/01/2017. Electronically Signed   By: Lovey Newcomer M.D.   On: 02/01/2017 10:12   Result Date: 02/07/2017 CLINICAL DATA:  Patient with ulcerated left breast mass for percutaneous biopsy. EXAM: ULTRASOUND GUIDED LEFT BREAST CORE NEEDLE BIOPSY COMPARISON:  Previous exam(s). FINDINGS: I met with the patient and we discussed the procedure of ultrasound-guided biopsy, including benefits and alternatives. We discussed the high likelihood of a successful procedure. We discussed the risks of the procedure, including infection, bleeding, tissue injury, clip migration, and inadequate sampling. Informed written consent was given. The usual time-out protocol was performed immediately prior to the procedure. Using sterile technique and 1% Lidocaine as local anesthetic, under direct ultrasound visualization, a 12 gauge spring-loaded  device was used to perform biopsy of left breast mass 9 o'clock position using a medial approach. At the conclusion of the procedure a ribbon shaped tissue marker clip was deployed into the biopsy cavity. Follow up 2 view mammogram was performed and  dictated separately. IMPRESSION: Ultrasound guided biopsy of left breast mass 9 o'clock position. No apparent complications. Electronically Signed: By: Lovey Newcomer M.D. 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 Multifocal tumor in the left breast with biopsy-proven left axillary metastases. Noted to have extensive bone metastases and pulmonary metastases. Patient is noted to have calvarial metastases but no overt parenchymal metastasis  Plan -Patient had chemotherapy counseling -Patient has started on Ribociclib/Letrozole for 1st line therapy of metastatic ER/PR positive HER-2/neu negative -EKG today shows normal QTc at 415 milliseconds b -Additional questions regarding her treatment were answered in details including need for lab monitoring. -We have held her Lexapro temporarily discussed that he might restarted once very able to cut down her narcotics for pain.  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 Her pain appears to be improving rapidly with palliative radiation, steroids, rx for breast cancer and Xgeva. -She will follow-up to complete her palliative radiation likely this Friday. -Continue on Xgeva every 4 weeks. -We will continue her dexamethasone at 4 mg twice a day until completion of radiation, then will drop it to 4 mg by mouth daily for 5-7 days then 2 mg daily for 3 days and then stop.  #3 vitamin D deficiency -Aggressive treatment with ergocalciferol 50,000 units  once weekly. -Counseled to increase calcium  in her diet- with consumption of milk cheese yogurt and nuts.  #4 cancer related pain -Palliative radiation to symptomatic spinal metastasis. -Patient notes the pain is getting better and she isn't needing much of her when necessary Dilaudid. -We discussed and decided to hold her morning dose of OxyContin and continue evening dose of OxyContin and  when necessary Dilaudid for breakthrough pain. - after week or so if the pain is still fairly well-controlled and she is not needing much breakthrough pain medication we will plan to drop her evening dose of OxyContin .  #5 depression with anxiety -Was started on Lexapro but would hold off at this time due to concern with daily prolongation with her Ribociclib. - we will likely restart this once the patient is weaned off of some of the narcotics and if that is not much daily prolongation with her Ribociclib.  #6 family history of breast cancer -We did discuss about genetic testing and referral. -Patient is currently overwhelmed with her diagnosis and wants to start off on treatment and give it some time before considering genetic counseling and testing which is quite reasonable.  #7 Smoking Counseled on smoking cessationded she set up a primary care physician .  -Continue Marchelle Folks -f/u with Dr Irene Limbo with labs and rpt EKG in 3 weeks on same day as next Xgeva    All of the patients questions were answered with apparent satisfaction. The patient knows to call the clinic with any problems, questions or concerns.  I spent 30 minutes counseling the patient face to face. The total time spent in the appointment was 40 minutes and more than 50% was on counseling and direct patient cares.    Sullivan Lone MD [place] AAHIVMS [facility] Hematology/Oncology Physician [facility]  (Office):       [phone] (Work cell):  [phone] (Fax):           [phone]

## 2017-02-17 ENCOUNTER — Ambulatory Visit
Admission: RE | Admit: 2017-02-17 | Discharge: 2017-02-17 | Disposition: A | Discharge status: Home / Self Care | Payer: BC Managed Care – PPO | Source: Ambulatory Visit | Attending: Radiation Oncology | Admitting: Radiation Oncology

## 2017-02-17 ENCOUNTER — Ambulatory Visit: Payer: BC Managed Care – PPO

## 2017-02-17 ENCOUNTER — Encounter: Payer: Self-pay | Admitting: Hematology

## 2017-02-17 DIAGNOSIS — C7951 Secondary malignant neoplasm of bone: Secondary | ICD-10-CM | POA: Diagnosis not present

## 2017-02-17 NOTE — Progress Notes (Signed)
Received electronic PA approval for Ondansetron from [facility]. Approval 02/16/17-08/19/17 as long as she remains covered by plan and there are no changes to the plan's benefits.

## 2017-02-18 ENCOUNTER — Encounter: Payer: Self-pay | Admitting: Radiation Oncology

## 2017-02-18 ENCOUNTER — Ambulatory Visit
Admission: RE | Admit: 2017-02-18 | Discharge: 2017-02-18 | Disposition: A | Discharge status: Home / Self Care | Payer: BC Managed Care – PPO | Source: Ambulatory Visit | Attending: Radiation Oncology | Admitting: Radiation Oncology

## 2017-02-18 ENCOUNTER — Ambulatory Visit: Payer: BC Managed Care – PPO

## 2017-02-18 DIAGNOSIS — C7951 Secondary malignant neoplasm of bone: Secondary | ICD-10-CM | POA: Diagnosis not present

## 2017-02-21 ENCOUNTER — Ambulatory Visit: Payer: BC Managed Care – PPO

## 2017-02-21 ENCOUNTER — Other Ambulatory Visit: Payer: Self-pay | Admitting: Nurse Practitioner

## 2017-02-21 ENCOUNTER — Telehealth: Payer: Self-pay

## 2017-02-21 DIAGNOSIS — K1231 Oral mucositis (ulcerative) due to antineoplastic therapy: Secondary | ICD-10-CM

## 2017-02-21 MED ORDER — MAGIC MOUTHWASH W/LIDOCAINE: 5.0000 mL | Freq: Four times a day (QID) | ORAL | 1 refills | Status: DC | PRN

## 2017-02-21 MED FILL — MAGIC MOUTHWASH W/LIDO 1:1: 12 days supply | Qty: 240 | Fill #0

## 2017-02-21 NOTE — Telephone Encounter (Signed)
Pt called stating she has white on her tongue and has a few questions. May want to be seen today. Called back The spots started this weekend. They are not tender. The throat is a little tender. No fever She is taking kisqale femara.  Question pt has an eye appt at 1600. Is there any stipulations on dilation.  s/w Selena Lesser. MMW with nystatin and lidocaine rx sent to [facility] outpatient pharmacy for compounding. Pt is OK to have eyes dilated.

## 2017-02-22 ENCOUNTER — Ambulatory Visit: Payer: BC Managed Care – PPO

## 2017-03-01 ENCOUNTER — Encounter: Payer: Self-pay | Admitting: *Deleted

## 2017-03-01 ENCOUNTER — Telehealth: Payer: Self-pay | Admitting: *Deleted

## 2017-03-01 NOTE — Telephone Encounter (Signed)
Received call from Helmut Muster stating that pt saw SW & was told to ask dr Irene Limbo for an antidepressant that could be called in since lexapro interferes with the hormonal treatment.  Message routed to Dr Launa Flight RN

## 2017-03-01 NOTE — Progress Notes (Signed)
[place] [employer]  [employer] representative received referral from patient requesting information on emotional support and programs.  CSW met with patient and patients sister in [place] office at [facility].  Patient has experienced increased emotional stress since her diagnosis.  Patient also described frustrations with eating and maintaining appropriate nutrition.  CSW attempted to normalized patients feelings by discussing common feelings and emotions associated with a cancer diagnosis.  CSW informed patient of the support services at [facility].  Patient expressed interest in many of the program opportunity's including; support group, cooking classes, art workshops and massages.  CSW and Patient also discussed the importance of emotional support and counseling resources.  Patient was open to referral information.  CSW provided patient with [facility] contact information.  Patient has previously met with [place] and stated her nausea is improving, but had some additional questions.  CSW referred to [place], who will contact patient.  CSW provided contact information and additional support.  CSW encouraged patient to call with any additional needs or concerns.   Johnnye Lana, MSW, LCSW, OSW-C Clinical Social Worker [facility] [phone]

## 2017-03-02 ENCOUNTER — Other Ambulatory Visit: Payer: Self-pay | Admitting: *Deleted

## 2017-03-02 ENCOUNTER — Telehealth: Payer: Self-pay | Admitting: *Deleted

## 2017-03-02 DIAGNOSIS — C50919 Malignant neoplasm of unspecified site of unspecified female breast: Secondary | ICD-10-CM

## 2017-03-02 NOTE — Progress Notes (Signed)
  Radiation Oncology         [phone]) [phone] ________________________________  Name: Corbyn Steedman MRN: [id number]  Date: 02/18/2017  DOB: Apr 12, 1956  End of Treatment Note  Diagnosis:   61 y.o. 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     Indication for treatment:  Palliation       Radiation treatment dates:   02/07/2017 to 02/18/2017  Site/dose:   The L-Spine was treated to 30 Gy in 10 fractions at 3 Gy per fraction.   Beams/energy:   Isodose Plan // 15X  Narrative: The patient tolerated radiation treatment relatively well. Patient reports she has increased mobility due to decreased pain. She also notes she has been able to decrease her pain medication accordingly. She also reports nausea and took Zofran before radiation treatments which helps.  Plan: The patient has completed radiation treatment. The patient will return to radiation oncology clinic for routine followup in one month. I advised her to call or return sooner if she has any questions or concerns related to her recovery or treatment. ________________________________  Sheral Apley. Tammi Klippel, M.D.   This document serves as a record of services personally performed by Tyler Pita, MD. It was created on his behalf by Arlyce Harman, a trained medical scribe. The creation of this record is based on the scribe's personal observations and the provider's statements to them. This document has been checked and approved by the attending provider.

## 2017-03-02 NOTE — Telephone Encounter (Signed)
Received phone call from pts sister, Rise Paganini asking if Dr. Irene Limbo could prescribe an anti-depressant since Dr. Irene Limbo ordered to stop anti depressant pt was originally taking.  Reviewed with Dr. Irene Limbo, MD stated pt would need to have a follow up EKG prior to restarting an SSRI.  Pt scheduled to return to clinic 4/16, EKG ordered to be done that day.  LVM with Rise Paganini explaining the above information.  Call back number provided.

## 2017-03-02 NOTE — Progress Notes (Signed)
Nutrition  Message received from Education officer, museum as patient with nutrition questions regarding reflux.  Attempted to reach patient via phone but was unsuccessful.  Left voice mail.  Mailed nutrition information to patient.   Stacey Cox B. Zenia Resides, [place], [place] Registered Dietitian [phone] (pager)

## 2017-03-07 ENCOUNTER — Telehealth: Payer: Self-pay | Admitting: Hematology

## 2017-03-07 ENCOUNTER — Ambulatory Visit (HOSPITAL_BASED_OUTPATIENT_CLINIC_OR_DEPARTMENT_OTHER): Payer: BC Managed Care – PPO | Admitting: Hematology

## 2017-03-07 ENCOUNTER — Other Ambulatory Visit (HOSPITAL_BASED_OUTPATIENT_CLINIC_OR_DEPARTMENT_OTHER): Payer: BC Managed Care – PPO

## 2017-03-07 ENCOUNTER — Other Ambulatory Visit: Payer: Self-pay

## 2017-03-07 ENCOUNTER — Other Ambulatory Visit: Payer: Self-pay | Admitting: *Deleted

## 2017-03-07 ENCOUNTER — Ambulatory Visit (HOSPITAL_BASED_OUTPATIENT_CLINIC_OR_DEPARTMENT_OTHER): Payer: BC Managed Care – PPO

## 2017-03-07 ENCOUNTER — Encounter: Payer: Self-pay | Admitting: Hematology

## 2017-03-07 VITALS — BP 122/77 | HR 95 | Temp 99.1°F | Resp 18 | Wt 165.1 lb

## 2017-03-07 DIAGNOSIS — E559 Vitamin D deficiency, unspecified: Secondary | ICD-10-CM

## 2017-03-07 DIAGNOSIS — Z720 Tobacco use: Secondary | ICD-10-CM

## 2017-03-07 DIAGNOSIS — C7800 Secondary malignant neoplasm of unspecified lung: Secondary | ICD-10-CM

## 2017-03-07 DIAGNOSIS — C7951 Secondary malignant neoplasm of bone: Secondary | ICD-10-CM | POA: Diagnosis not present

## 2017-03-07 DIAGNOSIS — Z803 Family history of malignant neoplasm of breast: Secondary | ICD-10-CM | POA: Diagnosis not present

## 2017-03-07 DIAGNOSIS — C773 Secondary and unspecified malignant neoplasm of axilla and upper limb lymph nodes: Secondary | ICD-10-CM | POA: Diagnosis not present

## 2017-03-07 DIAGNOSIS — F418 Other specified anxiety disorders: Secondary | ICD-10-CM | POA: Diagnosis not present

## 2017-03-07 DIAGNOSIS — D701 Agranulocytosis secondary to cancer chemotherapy: Secondary | ICD-10-CM

## 2017-03-07 DIAGNOSIS — C50912 Malignant neoplasm of unspecified site of left female breast: Secondary | ICD-10-CM

## 2017-03-07 DIAGNOSIS — G893 Neoplasm related pain (acute) (chronic): Secondary | ICD-10-CM

## 2017-03-07 DIAGNOSIS — C50919 Malignant neoplasm of unspecified site of unspecified female breast: Secondary | ICD-10-CM

## 2017-03-07 DIAGNOSIS — R748 Abnormal levels of other serum enzymes: Secondary | ICD-10-CM | POA: Diagnosis not present

## 2017-03-07 LAB — CBC & DIFF AND RETIC
BASO%: 1.8 % (ref 0.0–2.0)
Basophils Absolute: 0 10*3/uL (ref 0.0–0.1)
EOS%: 3.6 % (ref 0.0–7.0)
Eosinophils Absolute: 0 10*3/uL (ref 0.0–0.5)
HCT: 36.1 % (ref 34.8–46.6)
HGB: 12 g/dL (ref 11.6–15.9)
Immature Retic Fract: 17.7 % — ABNORMAL HIGH (ref 1.60–10.00)
LYMPH%: 19.6 % (ref 14.0–49.7)
MCH: 29.1 pg (ref 25.1–34.0)
MCHC: 33.2 g/dL (ref 31.5–36.0)
MCV: 87.4 fL (ref 79.5–101.0)
MONO#: 0.1 10*3/uL (ref 0.1–0.9)
MONO%: 8 % (ref 0.0–14.0)
NEUT%: 67 % (ref 38.4–76.8)
NEUTROS ABS: 0.8 10*3/uL — AB (ref 1.5–6.5)
NRBC: 0 % (ref 0–0)
RBC: 4.13 10*6/uL (ref 3.70–5.45)
RDW: 17.4 % — AB (ref 11.2–14.5)
RETIC CT ABS: 49.97 10*3/uL (ref 33.70–90.70)
Retic %: 1.21 % (ref 0.70–2.10)
WBC: 1.1 10*3/uL — AB (ref 3.9–10.3)
lymph#: 0.2 10*3/uL — ABNORMAL LOW (ref 0.9–3.3)

## 2017-03-07 LAB — COMPREHENSIVE METABOLIC PANEL
ALT: 82 U/L — AB (ref 0–55)
AST: 32 U/L (ref 5–34)
Albumin: 2.3 g/dL — ABNORMAL LOW (ref 3.5–5.0)
Alkaline Phosphatase: 237 U/L — ABNORMAL HIGH (ref 40–150)
Anion Gap: 12 mEq/L — ABNORMAL HIGH (ref 3–11)
BUN: 6.4 mg/dL — AB (ref 7.0–26.0)
CHLORIDE: 106 meq/L (ref 98–109)
CO2: 23 meq/L (ref 22–29)
CREATININE: 0.8 mg/dL (ref 0.6–1.1)
Calcium: 8.2 mg/dL — ABNORMAL LOW (ref 8.4–10.4)
EGFR: 88 mL/min/{1.73_m2} — ABNORMAL LOW (ref >90)
GLUCOSE: 116 mg/dL (ref 70–140)
Potassium: 4.1 mEq/L (ref 3.5–5.1)
SODIUM: 141 meq/L (ref 136–145)
Total Bilirubin: 0.34 mg/dL (ref 0.20–1.20)
Total Protein: 6.1 g/dL — ABNORMAL LOW (ref 6.4–8.3)

## 2017-03-07 LAB — MAGNESIUM: Magnesium: 1.8 mg/dl (ref 1.5–2.5)

## 2017-03-07 LAB — TECHNOLOGIST REVIEW

## 2017-03-07 MED ORDER — ESCITALOPRAM OXALATE 10 MG PO TABS: 10.0000 mg | ORAL_TABLET | Freq: Every day | ORAL | 1 refills | Status: DC

## 2017-03-07 MED ORDER — ERGOCALCIFEROL 1.25 MG (50000 UT) PO CAPS: 50000.0000 [IU] | ORAL_CAPSULE | ORAL | 0 refills | Status: DC

## 2017-03-07 MED ORDER — ONDANSETRON HCL 4 MG PO TABS: 4.0000 mg | ORAL_TABLET | Freq: Three times a day (TID) | ORAL | 0 refills | Status: DC | PRN

## 2017-03-07 MED ORDER — DENOSUMAB 120 MG/1.7ML ~~LOC~~ SOLN
120.0000 mg | Freq: Once | SUBCUTANEOUS | Status: AC
  Administered 2017-03-07: 120 mg via SUBCUTANEOUS
  Filled 2017-03-07: qty 1.7

## 2017-03-07 MED ORDER — RIBOCICLIB & LETROZOLE 200 & 2.5 MG PO TBPK: 1.0000 | ORAL_TABLET | ORAL | 3 refills | Status: DC

## 2017-03-07 NOTE — Patient Instructions (Signed)

## 2017-03-07 NOTE — Telephone Encounter (Signed)
Appointments scheduled per 4.16.18 LOS. Patient given AVS report and calendars with future scheduled appointments. °

## 2017-03-08 ENCOUNTER — Other Ambulatory Visit: Payer: Self-pay | Admitting: Pharmacist

## 2017-03-08 ENCOUNTER — Telehealth: Payer: Self-pay | Admitting: *Deleted

## 2017-03-08 ENCOUNTER — Other Ambulatory Visit: Payer: Self-pay | Admitting: *Deleted

## 2017-03-08 DIAGNOSIS — C50919 Malignant neoplasm of unspecified site of unspecified female breast: Secondary | ICD-10-CM

## 2017-03-08 LAB — PHOSPHORUS: PHOSPHORUS: 1.8 mg/dL — AB (ref 2.5–4.5)

## 2017-03-08 MED ORDER — RIBOCICLIB & LETROZOLE 200 & 2.5 MG PO TBPK: 400.0000 mg | ORAL_TABLET | Freq: Every day | ORAL | 3 refills | Status: DC

## 2017-03-08 MED ORDER — K PHOS MONO-SOD PHOS DI & MONO 155-852-130 MG PO TABS: 500.0000 mg | ORAL_TABLET | Freq: Two times a day (BID) | ORAL | 0 refills | Status: AC

## 2017-03-08 MED ORDER — LORAZEPAM 0.5 MG PO TABS: 0.5000 mg | ORAL_TABLET | Freq: Four times a day (QID) | ORAL | 0 refills | Status: DC | PRN

## 2017-03-08 NOTE — Telephone Encounter (Signed)
-----   Message from [name], MD sent at 03/08/2017  8:45 AM EDT ----- Plz let Ms Cichowski know that her phosphorus levels were low at 1.87 addition to increasing her vitamin D replacement as discussed yesterday I have sent a prescription for oral phosphorus replacement for one week.  thx GK

## 2017-03-08 NOTE — Telephone Encounter (Signed)
Informed patient of information below. Patient verbalized understanding.

## 2017-03-08 NOTE — Progress Notes (Signed)
[name]    HEMATOLOGY/ONCOLOGY CLINIC NOTE  Date of Service: .03/07/2017  Patient Care Team: No Pcp Per Patient as PCP - General (General Practice)  CHIEF COMPLAINTS/PURPOSE OF CONSULTATION:  Newly diagnosed metastatic breast cancer   HISTORY OF PRESENTING ILLNESS:   Stacey Cox is a wonderful 61 y.o. female who has been referred to [place] by Dr Lucious Groves, DO  for evaluation and management of likely metastatic breast cancer.  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 She also notes having developed left lower inner breast discomfort and  Ulceration over the last several weeks.  Patient has a CT abd renal proctocol to R/o kidney stones that showed Multiple metastasis in the thoracic and lumbar spine included LEFT L3 transverse process and RIGHT T12 transverse pathologic fractures. Multiple pulmonary nodules measuring to 9 mm.   Patient notes no weight loss no night sweat. Last mammogram >10 yrs ago.  No headaches no overt FND FHx of breast cancer in her mother and sister.  Patient noted that she has a phobia of the medical system and therefore had not actively sought a PCP or routine cares. She was anxious about how she how be able to management her live "alone". Supportive counseling was provided.  A CT chest, bone scan and MRI brain were recommended to complete staging workup.  CT chest was done which showed Primary left breast cancer with left axillary and subpectoral adenopathy.  Metastatic disease in the thorax with multiple pulmonary nodules mild mediastinal adenopathy. Left hilar soft tissue prominence may be central pulmonary nodule or adenopathy. Osseous metastatic disease with mild T6 compression fracture with lucent lesion anteriorly and lucencies in the posterior elements of T9 through T12. Fracture of right anterior rib has surrounding callus is likely a pathologic  fracture.  Bone scan was done which showed significant osseous metastatic disease involving spine, calvarium and rt ribs cage.  Patient was admitted for symptoms control and expedited workup. She has been seen by Surgery.  Patient subsequently has had mammogram which showed Within the medial aspect of the left breast there is a 4.1 cm ulcerated mass. Slightly anterior to this within the medial left breast middle depth is a 2.3 cm oval lobular mass. Within the upper inner left breast there is a 1.4 cm mass with associated distortion. Within the slightly medial right breast middle depth there is a 0.8 m oval circumscribed mass. Multiple prominent left axillary lymph nodes are demonstrated on mammography.  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 No DCIS noted. Lymph node was positive for metastatic carcinoma consistent with breast primary.  Patient notes difficult to control lower back pain despite OxyContin and when necessary oxycodone. She is agreeable to getting a referral to radiation oncology for palliative radiation to her significantly symptomatic spinal metastases.  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 She is here with her sister and colleague from work. She notes that her back pain has resolved and she is not needing any breakthrough pain medications. She was recommended to discontinue oxycontin completely. Notes depression/anxiety and wants to be back on lexapro. EKG with nl QTc of 414mce today. Restarted on lexapro 129mpo  daily. Notes thrush resolved. Eating better. Back to work part time. She notes that her left breast ulceration is healing.  MEDICAL HISTORY:  Past Medical History:  Diagnosis Date  . Cancer  [facility])    Metastatic Breast Cancer    SURGICAL HISTORY: Past Surgical History:  Procedure Laterality Date  . CHOLECYSTECTOMY    . TONSILLECTOMY      SOCIAL HISTORY: Social History   Social History  . Marital status: Single    Spouse name: N/A  . Number of children: N/A  . Years of education: N/A   Occupational History  . Not on file.   Social History Main Topics  . Smoking status: Former Smoker    Packs/day: 0.00    Types: Cigarettes    Quit date: 01/21/2017  . Smokeless tobacco: Never Used  . Alcohol use No  . Drug use: No  . Sexual activity: No   Other Topics Concern  . Not on file   Social History Narrative  . 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 Cancer Mother     breast  . Cancer Sister     breast  Mother with h/o breast cancer in her 60's Sister with breast cancer in her 51ys and later was diagnosed with multiple myeloma. (Patient is not aware of any specific breast cancer mutations present)  ALLERGIES:  is allergic to thorazine [chlorpromazine].  MEDICATIONS:  Current Outpatient Prescriptions  Medication Sig Dispense Refill  . ergocalciferol (VITAMIN D2) 50000 units capsule Take 1 capsule (50,000 Units total) by mouth 2 (two) times a week. 24 capsule 0  . escitalopram (LEXAPRO) 10 MG tablet Take 1 tablet (10 mg total) by mouth daily. 30 tablet 1  . HYDROmorphone (DILAUDID) 4 MG tablet Take 1 tablet (4 mg total) by mouth every 3 (three) hours as needed for severe pain. 60 tablet 0  . LORazepam (ATIVAN) 0.5 MG tablet Take 1 tablet (0.5 mg total) by mouth every 6 (six) hours as needed for anxiety. 60 tablet 0  . magic mouthwash w/lidocaine SOLN Take 5 mLs by mouth 4 (four) times daily as needed for mouth pain. Duke's formula with nystatin and lidocaine 1:1 240 mL 1  . ondansetron (ZOFRAN) 4 MG  tablet Take 1 tablet (4 mg total) by mouth every 8 (eight) hours as needed for nausea or vomiting. 30 tablet 0  . polyethylene glycol (MIRALAX) packet Take 17 g by mouth daily. (Patient not taking: Reported on 02/05/2017) 30 each 1  . Ribociclib & Letrozole (KISQALI FEMARA 600 DOSE) 200 & 2.5 MG TBPK Take 1 each by mouth See admin instructions. Ribociclib 478m 3weeks on 1 week off. Letrozole 2.581mpo daily without interruption. 49 each 3  . senna-docusate (SENNA S) 8.6-50 MG tablet Take 2 tablets by mouth 2 (two) times daily. 60 tablet 1   No current facility-administered medications for this visit.     REVIEW OF SYSTEMS:    10 Point review of Systems was done is negative except as noted above.  PHYSICAL EXAMINATION: ECOG PERFORMANCE STATUS: 1 - Symptomatic but completely ambulatory  . Vitals:   03/07/17 1222  BP: 122/77  Pulse: 95  Resp: 18  Temp: 99.1 F (37.3 C)   Filed Weights   03/07/17 1222  Weight: 165 lb 1 oz (74.9 kg)   .Body mass index is  27.47 kg/m.  GENERAL:alert, in no acute distress and comfortable SKIN: no acute rashes, no significant lesions EYES: conjunctiva are pink and non-injected, sclera anicteric OROPHARYNX: MMM, no exudates, no oropharyngeal erythema or ulceration NECK: supple, no JVD LYMPH:  no palpable lymphadenopathy in the cervical, axillary or inguinal regions LUNGS: clear to auscultation b/l with normal respiratory effort HEART: regular rate & rhythm ABDOMEN:  normoactive bowel sounds , non tender, not distended. Extremity: no pedal edema PSYCH: alert & oriented x 3 with fluent speech NEURO: no focal motor/sensory deficits Breast examination was done recently in the hospital and not repeated today  LABORATORY DATA:  I have reviewed the data as listed  . CBC Latest Ref Rng & Units 03/07/2017 02/14/2017 02/07/2017  WBC 3.9 - 10.3 10e3/uL 1.1(L) 12.9(H) 11.9(H)  Hemoglobin 11.6 - 15.9 g/dL 12.0 14.2 13.3  Hematocrit 34.8 - 46.6 % 36.1 43.1  39.5  Platelets 145 - 400 10e3/uL 90 Large platelets present(L) 289 300    . CMP Latest Ref Rng & Units 03/07/2017 02/14/2017 02/07/2017  Glucose 70 - 140 mg/dl 116 162(H) 145(H)  BUN 7.0 - 26.0 mg/dL 6.4(L) 17.1 17.3  Creatinine 0.6 - 1.1 mg/dL 0.8 1.1 0.9  Sodium 136 - 145 mEq/L 141 136 139  Potassium 3.5 - 5.1 mEq/L 4.1 4.4 3.7  Chloride 101 - 111 mmol/L - - -  CO2 22 - 29 mEq/L _0 Calcium 8.4 - 10.4 mg/dL 8.2(L) 8.7 9.5  Total Protein 6.4 - 8.3 g/dL 6.1(L) 7.3 7.2  Total Bilirubin 0.20 - 1.20 mg/dL 0.34 0.92 0.37  Alkaline Phos 40 - 150 U/L 237(H) 182(H) 135  AST 5 - 34 U/L 32 15 29  ALT 0 - 55 U/L 82(H) 32 42   Component     Latest Ref Rng & Units 01/21/2017 02/07/2017  HIV     Non Reactive Non Reactive   CA 15-3     0.0 - 25.0 U/mL 32.4 (H)   CA 27.29     0.0 - 38.6 U/mL 37.6   Vitamin D, 25-Hydroxy     30.0 - 100.0 ng/mL  14.2 (L)  Phosphorus     2.5 - 4.5 mg/dL  3.5            RADIOGRAPHIC STUDIES: I have personally reviewed the radiological images as listed and agreed with the findings in the report. No results found.  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 Multifocal tumor in the left breast with biopsy-proven left axillary metastases. Noted to have extensive bone metastases and pulmonary metastases. Patient is noted to have calvarial metastases but no overt parenchymal metastasis  #2 bone metastases due to breast cancer  # 3 Neutropenia Related to her Ribociclib  #4 Increased ALT about 2X ULN - likely from Ribociclib  #5 Cancer related pain - controlled - status post palliative RT to spine  Plan --We will decrease her dose of Ribociclib to 400 mg daily 3 weeks on 1 week of. -Continue daily letrozole 2.5 mg. --EKG today shows normal QTc at 420 milliseconds  --Repeat labs in 3-4 weeks with clinic follow-up. -Continue on  Xgeva every 4 weeks. -Since her pain is well-controlled will shall discontinue her OxyContin. She will use only when necessary Dilaudid for breakthrough pain.   #6 vitamin D deficiency #7 Hypophophatemia due to vitamin D deficiency and Ribociclib -Aggressive treatment with ergocalciferol 50,000 units  increased to twice weekly. -Counseled to increase calcium  in her diet- with consumption of milk cheese yogurt and nuts. -Prescribed Neutra-Phos replacement for one week and counseled on dietary changes.   #7 depression with anxiety -Patient's QT interval has been stable. She is off her long-acting narcotics which should also help this. And is on a lower dose of the Ribociclib due to neutropenia. -Considering all this I think it is reasonable based on the patient's request to start her back on Lexapro 10 mg by mouth daily. -We shall repeat an EKG again on her next visit in a month. -Patient has been established with a psychologist but she also notes has been helpful but still prefers traditionally have antidepressant and anxiety medication. -We discussed the importance of keeping physically active as a means to reduce anxiety.  #6 family history of breast cancer -We did discuss about genetic testing and referral. -Patient is currently overwhelmed with her diagnosis and wants to start off on treatment and give it some time before considering genetic counseling and testing which is quite reasonable.  #7 Smoking Counseled on smoking cessation  RTC with Dr Irene Limbo in 4 weeks with labs Continue Xgeva q4weeks EKG on followup in 4 weeks  All of the patients questions were answered with apparent satisfaction. The patient knows to call the clinic with any problems, questions or concerns.  I spent 32 minutes counseling the patient face to face. The total time spent in the appointment was 40 minutes and more than 50% was on counseling and direct patient cares.    Sullivan Lone MD [place] AAHIVMS [facility]  [facility] Hematology/Oncology Physician [facility]  (Office):       [phone] (Work cell):  [phone] (Fax):           [phone]

## 2017-03-11 ENCOUNTER — Telehealth: Payer: Self-pay | Admitting: *Deleted

## 2017-03-11 NOTE — Telephone Encounter (Signed)
Returned call to pt, with Dr. Grier Mitts input as noted below. She reports tingling has subsided. Instructed her to call the office with any neurologic symptoms. She voiced understanding.

## 2017-03-11 NOTE — Telephone Encounter (Signed)
Call from pt's friend reporting she has "tingling" in her face since yesterday. She began taking the phosphorus supplement on 4/18. Pt denies any numbness, vision change, n/v or gait disturbance.  Message to Dr. Irene Limbo for review.

## 2017-03-11 NOTE — Telephone Encounter (Signed)
Monitor symptoms. Call if any new focal neurological symptoms. Could be from lower calcium levels post Xgeva -- increased Vitr D already recently and patient recommended to increase dietary calcium. thx GK

## 2017-03-18 MED FILL — MAGIC MOUTHWASH W/LIDO 1:1: 12 days supply | Qty: 240 | Fill #1

## 2017-03-21 ENCOUNTER — Encounter: Payer: Self-pay | Admitting: Oncology

## 2017-03-21 ENCOUNTER — Ambulatory Visit (HOSPITAL_BASED_OUTPATIENT_CLINIC_OR_DEPARTMENT_OTHER): Payer: BC Managed Care – PPO | Admitting: Oncology

## 2017-03-21 VITALS — BP 108/71 | HR 108 | Temp 98.5°F | Resp 17 | Ht 65.0 in | Wt 159.9 lb

## 2017-03-21 DIAGNOSIS — L271 Localized skin eruption due to drugs and medicaments taken internally: Secondary | ICD-10-CM | POA: Diagnosis not present

## 2017-03-21 DIAGNOSIS — C50919 Malignant neoplasm of unspecified site of unspecified female breast: Secondary | ICD-10-CM

## 2017-03-21 DIAGNOSIS — R110 Nausea: Secondary | ICD-10-CM

## 2017-03-21 MED ORDER — CLOBETASOL PROPIONATE 0.05 % EX CREA: 1.0000 "application " | TOPICAL_CREAM | Freq: Two times a day (BID) | CUTANEOUS | 0 refills | Status: DC

## 2017-03-21 MED ORDER — ONDANSETRON HCL 4 MG PO TABS: 4.0000 mg | ORAL_TABLET | Freq: Three times a day (TID) | ORAL | 1 refills | Status: DC | PRN

## 2017-03-21 NOTE — Patient Instructions (Signed)
I have refilled your ondansetron and this is been sent to your pharmacy.  I have sent a prescription for clobetasol cream to be used to your hands twice a day. You should also stay well moisturized with udder cream. Also use moisturizer on your feet.  If you are hands worsen, call our office. Keep your appointment as scheduled on 04/04/2017.

## 2017-03-21 NOTE — Progress Notes (Signed)
 SYMPTOM MANAGEMENT CLINIC    Chief Complaint: Peeling of skin on hands  HPI:  Stacey Cox 61 y.o. female diagnosed with metastatic breast cancer currently being treated with Kisqali and Femara. She is currently on her second cycle of Kisqali at a reduced dose of 400 mg daily 3 weeks on and one-week off. She started her second cycle on 03/14/2017. The patient developed peeling of her hands about 4 days ago. She has been putting lotion that her nephew gave her on her hands. States that the hands are not worsening and are not painful. Denies any peeling of the skin or pain to her feet. Denies mucositis. Reports having ongoing diarrhea which is unchanged from previous reports. Uses Imodium as needed. The patient reports nausea with her treatment and uses ondansetron as a premedication. She remains on Lexapro reports that her depression has improved. She denies any pain and is completely off all of her pain medications.   No history exists.    Review of Systems  Constitutional: Negative.   HENT: Negative.   Eyes: Negative.   Respiratory: Negative.   Cardiovascular: Negative.   Gastrointestinal: Positive for diarrhea and nausea. Negative for blood in stool, constipation and vomiting.  Genitourinary: Negative.   Musculoskeletal: Negative.   Skin: Negative for itching and rash.       Peeling of skin on her hands.  Neurological: Negative.   Endo/Heme/Allergies: Negative.   Psychiatric/Behavioral: Negative.     Past Medical History:  Diagnosis Date  . Cancer (HCC)    Metastatic Breast Cancer    Past Surgical History:  Procedure Laterality Date  . CHOLECYSTECTOMY    . TONSILLECTOMY      has Metastatic breast cancer (HCC); Pathologic fracture; Breast mass, left; Osseous metastasis (HCC); Malignant neoplasm metastatic to lung (HCC); Neoplasm related pain; and Bone metastases (HCC) on her problem list.    is allergic to thorazine [chlorpromazine].  Allergies as of 03/21/2017    Reactions   Thorazine [chlorpromazine] Anaphylaxis      Medication List       Accurate as of 03/21/17  1:13 PM. Always use your most recent med list.          ergocalciferol 50000 units capsule Commonly known as:  VITAMIN D2 Take 1 capsule (50,000 Units total) by mouth 2 (two) times a week.   escitalopram 10 MG tablet Commonly known as:  LEXAPRO Take 1 tablet (10 mg total) by mouth daily.   HYDROmorphone 4 MG tablet Commonly known as:  DILAUDID Take 1 tablet (4 mg total) by mouth every 3 (three) hours as needed for severe pain.   LORazepam 0.5 MG tablet Commonly known as:  ATIVAN Take 1 tablet (0.5 mg total) by mouth every 6 (six) hours as needed for anxiety.   magic mouthwash w/lidocaine Soln Take 5 mLs by mouth 4 (four) times daily as needed for mouth pain. Duke's formula with nystatin and lidocaine 1:1   ondansetron 4 MG tablet Commonly known as:  ZOFRAN Take 1 tablet (4 mg total) by mouth every 8 (eight) hours as needed for nausea or vomiting.   polyethylene glycol packet Commonly known as:  MIRALAX Take 17 g by mouth daily.   Ribociclib & Letrozole 200 & 2.5 MG Tbpk Commonly known as:  KISQALI FEMARA 400 DOSE Take 400 mg by mouth daily. Take 400mg Kisqali daily for 21 days on, 7 days off. Take Femara 2.5mg daily   senna-docusate 8.6-50 MG tablet Commonly known as:  SENNA S Take 2   tablets by mouth 2 (two) times daily.        PHYSICAL EXAMINATION  Oncology Vitals 03/07/2017 02/14/2017  Height - 165 cm  Weight 74.872 kg 79.153 kg  Weight (lbs) 165 lbs 1 oz 174 lbs 8 oz  BMI (kg/m2) 27.47 kg/m2 29.04 kg/m2  Temp 99.1 98.1  Pulse 95 56  Resp 18 18  SpO2 100 100  BSA (m2) 1.85 m2 1.91 m2   BP Readings from Last 2 Encounters:  03/07/17 122/77  02/14/17 139/76    Physical Exam  Constitutional: She is oriented to person, place, and time and well-developed, well-nourished, and in no distress.  HENT:  Head: Normocephalic.  Mouth/Throat: Oropharynx is  clear and moist. No oropharyngeal exudate.  Cardiovascular: Normal rate and regular rhythm.   Pulmonary/Chest: Effort normal and breath sounds normal. No respiratory distress.  Abdominal: Soft. Bowel sounds are normal. There is no tenderness.  Musculoskeletal: She exhibits no edema.  Neurological: She is alert and oriented to person, place, and time.  Skin:  Bilateral hands with palmar-plantar erythrodysesthesia. Mild erythema noted to the hands bilaterally. Bilateral feet with dry skin but no redness, tenderness, or sloughing of skin.  Psychiatric: Mood, memory, affect and judgment normal.  Vitals reviewed.   LABORATORY DATA:. No visits with results within 3 Day(s) from this visit.  Latest known visit with results is:  Appointment on 03/07/2017  Component Date Value Ref Range Status  . WBC 03/07/2017 1.1* 3.9 - 10.3 10e3/uL Final  . NEUT# 03/07/2017 0.8* 1.5 - 6.5 10e3/uL Final  . HGB 03/07/2017 12.0  11.6 - 15.9 g/dL Final  . HCT 03/07/2017 36.1  34.8 - 46.6 % Final  . Platelets 03/07/2017 90 Large platelets present* 145 - 400 10e3/uL Final  . MCV 03/07/2017 87.4  79.5 - 101.0 fL Final  . MCH 03/07/2017 29.1  25.1 - 34.0 pg Final  . MCHC 03/07/2017 33.2  31.5 - 36.0 g/dL Final  . RBC 03/07/2017 4.13  3.70 - 5.45 10e6/uL Final  . RDW 03/07/2017 17.4* 11.2 - 14.5 % Final  . lymph# 03/07/2017 0.2* 0.9 - 3.3 10e3/uL Final  . MONO# 03/07/2017 0.1  0.1 - 0.9 10e3/uL Final  . Eosinophils Absolute 03/07/2017 0.0  0.0 - 0.5 10e3/uL Final  . Basophils Absolute 03/07/2017 0.0  0.0 - 0.1 10e3/uL Final  . NEUT% 03/07/2017 67.0  38.4 - 76.8 % Final  . LYMPH% 03/07/2017 19.6  14.0 - 49.7 % Final  . MONO% 03/07/2017 8.0  0.0 - 14.0 % Final  . EOS% 03/07/2017 3.6  0.0 - 7.0 % Final  . BASO% 03/07/2017 1.8  0.0 - 2.0 % Final  . nRBC 03/07/2017 0  0 - 0 % Final  . Retic % 03/07/2017 1.21  0.70 - 2.10 % Final  . Retic Ct Abs 03/07/2017 49.97  33.70 - 90.70 10e3/uL Final  . Immature Retic Fract  03/07/2017 17.70* 1.60 - 10.00 % Final  . Sodium 03/07/2017 141  136 - 145 mEq/L Final  . Potassium 03/07/2017 4.1  3.5 - 5.1 mEq/L Final  . Chloride 03/07/2017 106  98 - 109 mEq/L Final  . CO2 03/07/2017 23  22 - 29 mEq/L Final  . Glucose 03/07/2017 116  70 - 140 mg/dl Final  . BUN 03/07/2017 6.4* 7.0 - 26.0 mg/dL Final  . Creatinine 03/07/2017 0.8  0.6 - 1.1 mg/dL Final  . Total Bilirubin 03/07/2017 0.34  0.20 - 1.20 mg/dL Final  . Alkaline Phosphatase 03/07/2017 237* 40 - 150 U/L  Final  . AST 03/07/2017 32  5 - 34 U/L Final  . ALT 03/07/2017 82* 0 - 55 U/L Final  . Total Protein 03/07/2017 6.1* 6.4 - 8.3 g/dL Final  . Albumin 03/07/2017 2.3* 3.5 - 5.0 g/dL Final  . Calcium 03/07/2017 8.2* 8.4 - 10.4 mg/dL Final  . Anion Gap 03/07/2017 12* 3 - 11 mEq/L Final  . EGFR 03/07/2017 88* >90 ml/min/1.73 m2 Final  . Phosphorus, Ser 03/07/2017 1.8* 2.5 - 4.5 mg/dL Final  . Magnesium 03/07/2017 1.8  1.5 - 2.5 mg/dl Final  . Technologist Review 03/07/2017 rare myelocyte   Final    RADIOGRAPHIC STUDIES: No results found.  ASSESSMENT/PLAN:    No problem-specific Assessment & Plan notes found for this encounter.  This is a 61 year old female with metastatic breast cancer. She is currently being treated with Femara and Kisqali. She has now developed sloughing of the skin on her hands consistent with palmar-plantar erythrodysesthesia. She does not have any pain to her hands. The patient was given a prescription for clobetasol cream 0.05% to be used to the hands twice a day. We also discussed using a moisturizer such as udder cream to her hands at least 3-4 times a day. I have also recommended that she uses to her feet to prevent any skin breakdown. The patient will continue her treatment with Femara and Kisqali.   For the patient's nausea, I have refilled her ondansetron per her request.  The patient will keep her follow-up with Dr. Irene Limbo on 04/04/2017 for her metastatic breast cancer. She was  instructed to call us if her skin worsens or she develops any pain.  Plan reviewed with Dr. Irene Limbo.    Patient stated understanding of all instructions; and was in agreement with this plan of care. The patient knows to call the clinic with any problems, questions or concerns.   Total time spent with patient was 20 minutes;  with greater than 50 percent of that time spent in face to face counseling regarding patient's symptoms,  and coordination of care and follow up.  Mikey Bussing, NP 03/21/2017

## 2017-03-22 ENCOUNTER — Telehealth: Payer: Self-pay

## 2017-03-22 NOTE — Telephone Encounter (Signed)
Pt came in and saw [facility] , Kristin, yesterday for peeling skin. Pt called that she was looking through the handouts we gave her at beginning and the handout on kisqali - 3rd page on handout - showed allergic rxn like rash hives itching peeling of skin, etc. So she did find this and wanted to let us know.

## 2017-03-23 ENCOUNTER — Telehealth: Payer: Self-pay | Admitting: Hematology

## 2017-03-23 ENCOUNTER — Ambulatory Visit: Payer: Self-pay | Admitting: Urology

## 2017-03-23 ENCOUNTER — Ambulatory Visit (HOSPITAL_BASED_OUTPATIENT_CLINIC_OR_DEPARTMENT_OTHER): Payer: BC Managed Care – PPO | Admitting: Hematology

## 2017-03-23 VITALS — BP 115/68 | HR 102 | Temp 99.0°F | Resp 24 | Ht 65.0 in | Wt 158.8 lb

## 2017-03-23 DIAGNOSIS — Z170 Estrogen receptor positive status [ER+]: Secondary | ICD-10-CM

## 2017-03-23 DIAGNOSIS — C50812 Malignant neoplasm of overlapping sites of left female breast: Secondary | ICD-10-CM | POA: Diagnosis not present

## 2017-03-23 DIAGNOSIS — C7800 Secondary malignant neoplasm of unspecified lung: Secondary | ICD-10-CM

## 2017-03-23 DIAGNOSIS — B370 Candidal stomatitis: Secondary | ICD-10-CM

## 2017-03-23 DIAGNOSIS — C7951 Secondary malignant neoplasm of bone: Secondary | ICD-10-CM

## 2017-03-23 DIAGNOSIS — D701 Agranulocytosis secondary to cancer chemotherapy: Secondary | ICD-10-CM

## 2017-03-23 DIAGNOSIS — G893 Neoplasm related pain (acute) (chronic): Secondary | ICD-10-CM | POA: Diagnosis not present

## 2017-03-23 DIAGNOSIS — E559 Vitamin D deficiency, unspecified: Secondary | ICD-10-CM

## 2017-03-23 DIAGNOSIS — L26 Exfoliative dermatitis: Secondary | ICD-10-CM | POA: Diagnosis not present

## 2017-03-23 DIAGNOSIS — Z7189 Other specified counseling: Secondary | ICD-10-CM

## 2017-03-23 DIAGNOSIS — C773 Secondary and unspecified malignant neoplasm of axilla and upper limb lymph nodes: Secondary | ICD-10-CM | POA: Diagnosis not present

## 2017-03-23 DIAGNOSIS — T451X5D Adverse effect of antineoplastic and immunosuppressive drugs, subsequent encounter: Secondary | ICD-10-CM

## 2017-03-23 MED ORDER — NYSTATIN 100000 UNIT/ML MT SUSP
5.0000 mL | Freq: Four times a day (QID) | OROMUCOSAL | 0 refills | Status: DC
Start: 2017-03-23 — End: 2017-04-01

## 2017-03-23 MED ORDER — PREDNISONE 20 MG PO TABS: ORAL_TABLET | ORAL | 0 refills | Status: DC

## 2017-03-23 NOTE — Patient Instructions (Signed)
Thank you for choosing [facility] to provide your oncology and hematology care.  To afford each patient quality time with our providers, please arrive 30 minutes before your scheduled appointment time.  If you arrive late for your appointment, you may be asked to reschedule.  We strive to give you quality time with our providers, and arriving late affects you and other patients whose appointments are after yours.  If you are a no show for multiple scheduled visits, you may be dismissed from the clinic at the providers discretion.   Again, thank you for choosing [facility], our hope is that these requests will decrease the amount of time that you wait before being seen by our physicians.  ______________________________________________________________________ Should you have questions after your visit to the [facility], please contact our office at [phone] between the hours of 8:30 and 4:30 p.m.    Voicemails left after 4:30p.m will not be returned until the following business day.   For prescription refill requests, please have your pharmacy contact us directly.  Please also try to allow 48 hours for prescription requests.   Please contact the scheduling department for questions regarding scheduling.  For scheduling of procedures such as PET scans, CT scans, MRI, Ultrasound, etc please contact central scheduling at [phone].   Resources For Cancer Patients and Caregivers:  American Cancer Society:  800-227-2345  Can help patients locate various types of support and financial assistance Cancer Care: 1-800-813-HOPE (4673) Provides financial assistance, online support groups, medication/co-pay assistance.   [place] DSS:  336-641-3447 Where to apply for food stamps, Medicaid, and utility assistance Medicare Rights Center: 800-333-4114 Helps people with Medicare understand their rights and benefits, navigate the Medicare system, and secure the  quality healthcare they deserve SCAT: 336-333-6589 [place] [employer]'s shared-ride transportation service for eligible riders who have a disability that prevents them from riding the fixed route bus.   For additional information on assistance programs please contact our social worker:   Grier Hock/Abigail Elmore:  [phone] 

## 2017-03-23 NOTE — Telephone Encounter (Signed)
Gave patient AVS - appt for RTC already scheduled - referral to be scheduled/ contacted.

## 2017-03-24 NOTE — Progress Notes (Signed)
Stacey Cox    HEMATOLOGY/ONCOLOGY CLINIC NOTE  Date of Service: .03/23/2017  Patient Care Team: No Pcp Per Patient as PCP - General (General Practice)  CHIEF COMPLAINTS/PURPOSE OF CONSULTATION:  Newly diagnosed metastatic breast cancer   HISTORY OF PRESENTING ILLNESS:   Stacey Cox is a wonderful 61 y.o. female who has been referred to [place] by Dr Gust Rung, DO  for evaluation and management of likely metastatic breast cancer.  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 She also notes having developed left lower inner breast discomfort and  Ulceration over the last several weeks.  Patient has a CT abd renal proctocol to R/o kidney stones that showed Multiple metastasis in the thoracic and lumbar spine included LEFT L3 transverse process and RIGHT T12 transverse pathologic fractures. Multiple pulmonary nodules measuring to 9 mm.   Patient notes no weight loss no night sweat. Last mammogram >10 yrs ago.  No headaches no overt FND FHx of breast cancer in her mother and sister.  Patient noted that she has a phobia of the medical system and therefore had not actively sought a PCP or routine cares. She was anxious about how she how be able to management her live "alone". Supportive counseling was provided.  A CT chest, bone scan and MRI brain were recommended to complete staging workup.  CT chest was done which showed Primary left breast cancer with left axillary and subpectoral adenopathy.  Metastatic disease in the thorax with multiple pulmonary nodules mild mediastinal adenopathy. Left hilar soft tissue prominence may be central pulmonary nodule or adenopathy. Osseous metastatic disease with mild T6 compression fracture with lucent lesion anteriorly and lucencies in the posterior elements of T9 through T12. Fracture of right anterior rib has surrounding callus is likely a pathologic  fracture.  Bone scan was done which showed significant osseous metastatic disease involving spine, calvarium and rt ribs cage.  Patient was admitted for symptoms control and expedited workup. She has been seen by Surgery.  Patient subsequently has had mammogram which showed Within the medial aspect of the left breast there is a 4.1 cm ulcerated mass. Slightly anterior to this within the medial left breast middle depth is a 2.3 cm oval lobular mass. Within the upper inner left breast there is a 1.4 cm mass with associated distortion. Within the slightly medial right breast middle depth there is a 0.8 m oval circumscribed mass. Multiple prominent left axillary lymph nodes are demonstrated on mammography.  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 No DCIS noted. Lymph node was positive for metastatic carcinoma consistent with breast primary.  Patient notes difficult to control lower back pain despite OxyContin and when necessary oxycodone. She is agreeable to getting a referral to radiation oncology for palliative radiation to her significantly symptomatic spinal metastases.  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 She is here with a friend. She notes development of peeling skin on b/l hands and some on her abd and mid back. No muscosal involvement. No fevers/chills or discomfort at the site of rash. Mild pruritus. Concern for exfoliative dermatitisdue to Ribociclib - held. Dermatology referral given.  MEDICAL HISTORY:  Past Medical History:  Diagnosis Date  . Cancer [facility])    Metastatic Breast Cancer    SURGICAL HISTORY: Past Surgical History:  Procedure Laterality Date  . CHOLECYSTECTOMY    . TONSILLECTOMY      SOCIAL  HISTORY: Social History   Social History  . Marital status: Single    Spouse name: N/A  . Number of children: N/A  . Years of education: N/A   Occupational History  . Not on file.   Social History Main Topics  . Smoking status: Former Smoker    Packs/day: 0.00    Types: Cigarettes    Quit date: 01/21/2017  . Smokeless tobacco: Never Used  . Alcohol use No  . Drug use: No  . Sexual activity: No   Other Topics Concern  . Not on file   Social History Narrative  . 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 Cancer Mother     breast  . Cancer Sister     breast  Mother with h/o breast cancer in her 50's Sister with breast cancer in her 36ys and later was diagnosed with multiple myeloma. (Patient is not aware of any specific breast cancer mutations present)  ALLERGIES:  is allergic to thorazine [chlorpromazine] and ribociclib.  MEDICATIONS:  Current Outpatient Prescriptions  Medication Sig Dispense Refill  . clobetasol cream (TEMOVATE) 4.19 % Apply 1 application topically 2 (two) times daily. 60 g 0  . ergocalciferol (VITAMIN D2) 50000 units capsule Take 1 capsule (50,000 Units total) by mouth 2 (two) times a week. 24 capsule 0  . escitalopram (LEXAPRO) 10 MG tablet Take 1 tablet (10 mg total) by mouth daily. 30 tablet 1  . HYDROmorphone (DILAUDID) 4 MG tablet Take 1 tablet (4 mg total) by mouth every 3 (three) hours as needed for severe pain. 60 tablet 0  . LORazepam (ATIVAN) 0.5 MG tablet Take 1 tablet (0.5 mg total) by mouth every 6 (six) hours as needed for anxiety. 60 tablet 0  . magic mouthwash w/lidocaine SOLN Take 5 mLs by mouth 4 (four) times daily as needed for mouth pain. [facility]'s formula with nystatin and lidocaine 1:1 240 mL 1  . nystatin (MYCOSTATIN) 100000 UNIT/ML suspension Take 5 mLs (500,000 Units total)  by mouth 4 (four) times daily. 280 mL 0  . ondansetron (ZOFRAN) 4 MG tablet Take 1 tablet (4 mg total) by mouth every 8 (eight) hours as needed for nausea or vomiting. 30 tablet 0  . ondansetron (ZOFRAN) 4 MG tablet Take 1 tablet (4 mg total) by mouth every 8 (eight) hours as needed for nausea or vomiting. 20 tablet 1  . polyethylene glycol (MIRALAX) packet Take 17 g by mouth daily. (Patient not taking: Reported on 02/05/2017) 30 each 1  . predniSONE (DELTASONE) 20 MG tablet 14m po daily x 4 days then 230mdaily x 3days and then 1075mo daily x 7 days(with breakfast in AM) 30 tablet 0  . Ribociclib & Letrozole (KISQALI FEMARA 400 DOSE) 200 & 2.5 MG TBPK Take 400 mg by mouth daily. Take 400m78msqali daily for 21 days on, 7 days off. Take Femara 2.5mg 64mly 70 each 3  . senna-docusate (SENNA S) 8.6-50 MG tablet Take 2 tablets by mouth 2 (two) times daily. 60 tablet 1   No current facility-administered medications for this visit.     REVIEW  OF SYSTEMS:    10 Point review of Systems was done is negative except as noted above.  PHYSICAL EXAMINATION: ECOG PERFORMANCE STATUS: 1 - Symptomatic but completely ambulatory  . Vitals:   03/23/17 1152  BP: 115/68  Pulse: (!) 102  Resp: (!) 24  Temp: 99 F (37.2 C)   Filed Weights   03/23/17 1152  Weight: 158 lb 12.8 oz (72 kg)   .Body mass index is 26.43 kg/m.  GENERAL:alert, in no acute distress and comfortable SKIN: no acute rashes, no significant lesions EYES: conjunctiva are pink and non-injected, sclera anicteric OROPHARYNX: MMM, no exudates, no oropharyngeal erythema or ulceration NECK: supple, no JVD LYMPH:  no palpable lymphadenopathy in the cervical, axillary or inguinal regions LUNGS: clear to auscultation b/l with normal respiratory effort HEART: regular rate & rhythm ABDOMEN:  normoactive bowel sounds , non tender, not distended. Extremity: no pedal edema PSYCH: alert & oriented x 3 with fluent speech NEURO: no focal  motor/sensory deficits Breast examination was done recently in the hospital and not repeated today  LABORATORY DATA:  I have reviewed the data as listed  . CBC Latest Ref Rng & Units 03/07/2017 02/14/2017 02/07/2017  WBC 3.9 - 10.3 10e3/uL 1.1(L) 12.9(H) 11.9(H)  Hemoglobin 11.6 - 15.9 g/dL 12.0 14.2 13.3  Hematocrit 34.8 - 46.6 % 36.1 43.1 39.5  Platelets 145 - 400 10e3/uL 90 Large platelets present(L) 289 300   ANC 0.8  . CMP Latest Ref Rng & Units 03/07/2017 02/14/2017 02/07/2017  Glucose 70 - 140 mg/dl 116 162(H) 145(H)  BUN 7.0 - 26.0 mg/dL 6.4(L) 17.1 17.3  Creatinine 0.6 - 1.1 mg/dL 0.8 1.1 0.9  Sodium 136 - 145 mEq/L 141 136 139  Potassium 3.5 - 5.1 mEq/L 4.1 4.4 3.7  Chloride 101 - 111 mmol/L - - -  CO2 22 - 29 mEq/L 23 25 24   Calcium 8.4 - 10.4 mg/dL 8.2(L) 8.7 9.5  Total Protein 6.4 - 8.3 g/dL 6.1(L) 7.3 7.2  Total Bilirubin 0.20 - 1.20 mg/dL 0.34 0.92 0.37  Alkaline Phos 40 - 150 U/L 237(H) 182(H) 135  AST 5 - 34 U/L 32 15 29  ALT 0 - 55 U/L 82(H) 32 42   Component     Latest Ref Rng & Units 01/21/2017 02/07/2017  HIV     Non Reactive Non Reactive   CA 15-3     0.0 - 25.0 U/mL 32.4 (H)   CA 27.29     0.0 - 38.6 U/mL 37.6   Vitamin D, 25-Hydroxy     30.0 - 100.0 ng/mL  14.2 (L)  Phosphorus     2.5 - 4.5 mg/dL  3.5            RADIOGRAPHIC STUDIES: I have personally reviewed the radiological images as listed and agreed with the findings in the report. No results found.  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 Multifocal tumor in the left breast with biopsy-proven left axillary metastases. Noted to have extensive bone metastases and pulmonary metastases. 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 No other new medication. Plan -hold RIbociclib -Po prednisone taper -topical emollients petroleum jelly/Udder cream and topical steroids -given referral to dermatology. -Continue daily letrozole 2.5 mg. -Continue on Xgeva every 4 weeks. -Since her pain is well-controlled only on when necessary Dilaudid for breakthrough pain.   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 -Counseled to increase calcium  in her diet- with consumption of milk cheese yogurt and nuts. -Prescribed Neutra-Phos replacement for one week and counseled on dietary changes.  Dermatology referral (Dr Allyson Sabal) for ?Exfoliative dermatitis RTC as per next appointment Continue Xgeva q4weeks EKG on followup in 4 weeks  All of the patients questions were answered with apparent satisfaction. The patient knows to call the clinic with any problems, questions or concerns.  I spent 20 minutes counseling the patient face to face. The total time spent in the appointment was 25 minutes and more than 50% was on counseling and direct patient cares.    Sullivan Lone MD [place] AAHIVMS [facility] Hematology/Oncology [facility]  (Office):       [phone] (Work cell):  [phone] (Fax):           [phone]

## 2017-03-25 ENCOUNTER — Telehealth: Payer: Self-pay | Admitting: Hematology

## 2017-03-25 ENCOUNTER — Telehealth: Payer: Self-pay | Admitting: *Deleted

## 2017-03-25 DIAGNOSIS — Z7189 Other specified counseling: Secondary | ICD-10-CM | POA: Insufficient documentation

## 2017-03-25 NOTE — Telephone Encounter (Signed)
Called Dr. Eula Listen office per Referral - unable to reach anyone and left a message with call back number

## 2017-03-25 NOTE — Telephone Encounter (Signed)
Received call from pt stating that Dr Irene Limbo made referral to dermatologist & she wants to know what information she needs to give to dermatologist b/c she wants to skype with a friend/dermatologist in [place].  Reviewed Dr Grier Mitts note & informed concern for exfoliative dermatitis due to Ribociclib.  She appreciated the info & will get back with [place] to let us know what friend/dermatologist says. Message to Dr Launa Flight RN

## 2017-03-28 ENCOUNTER — Encounter: Payer: Self-pay | Admitting: *Deleted

## 2017-03-28 NOTE — Telephone Encounter (Signed)
[facility] called back - scheduled appt for 6/4  Per Mickel Baas. Patient aware of appt date and time.

## 2017-03-30 NOTE — Progress Notes (Signed)
Stacey Cox 61 y.o.woman with a recent diagnosis of breast cancer with metastasis to bone, localizing pain to the sacrum radiation completed 02-18-17, one month FU.  Pain:No, having left leg tingling since radiation. Fatigue:Reports fatigue if she is very active. Appetite:Good eating three meals per day. Bowel/Bladder issues:Normal bowel and bladder patterns. Weight: Wt Readings from Last 3 Encounters:  04/05/17 161 lb 12.8 oz (73.4 kg)  04/04/17 162 lb 9.6 oz (73.8 kg)  03/23/17 158 lb 12.8 oz (72 kg)    03-23-17 Saw Kale -hold RIbociclib -Po prednisone taper 1/2 tablet will take the last of the medication tomorrow, no evidence of thrush. -topical emollients petroleum jelly/Udder cream and topical steroids -given referral to dermatology. -Continue daily letrozole 2.5 mg. -Continue on Xgeva every 4 weeks. -Since her pain is well-controlled only on when necessary Dilaudid for breakthrough pain,off the Dilaudid BP 114/69   Pulse 91   Temp 98.3 F (36.8 C) (Oral)   Resp 18   Ht 5\' 5"  (1.651 m)   Wt 161 lb 12.8 oz (73.4 kg)   SpO2 97%   BMI 26.92 kg/m

## 2017-04-01 ENCOUNTER — Telehealth: Payer: Self-pay

## 2017-04-01 MED ORDER — NYSTATIN 100000 UNIT/ML MT SUSP: 5.0000 mL | Freq: Four times a day (QID) | OROMUCOSAL | 0 refills | Status: AC

## 2017-04-01 NOTE — Telephone Encounter (Signed)
Pt called for nystatin refill. She does not have enough to continue through her full course of prednisone. Refill done. lvm with pt that the [facility] was [address]

## 2017-04-04 ENCOUNTER — Other Ambulatory Visit (HOSPITAL_BASED_OUTPATIENT_CLINIC_OR_DEPARTMENT_OTHER): Payer: BC Managed Care – PPO

## 2017-04-04 ENCOUNTER — Telehealth: Payer: Self-pay | Admitting: Hematology

## 2017-04-04 ENCOUNTER — Encounter: Payer: Self-pay | Admitting: Hematology

## 2017-04-04 ENCOUNTER — Other Ambulatory Visit: Payer: Self-pay | Admitting: *Deleted

## 2017-04-04 ENCOUNTER — Ambulatory Visit (HOSPITAL_BASED_OUTPATIENT_CLINIC_OR_DEPARTMENT_OTHER): Payer: BC Managed Care – PPO

## 2017-04-04 ENCOUNTER — Ambulatory Visit (HOSPITAL_BASED_OUTPATIENT_CLINIC_OR_DEPARTMENT_OTHER): Payer: BC Managed Care – PPO | Admitting: Hematology

## 2017-04-04 VITALS — BP 132/88 | HR 83 | Temp 99.1°F | Resp 18 | Ht 65.0 in | Wt 162.6 lb

## 2017-04-04 DIAGNOSIS — C7951 Secondary malignant neoplasm of bone: Secondary | ICD-10-CM

## 2017-04-04 DIAGNOSIS — Z170 Estrogen receptor positive status [ER+]: Secondary | ICD-10-CM | POA: Diagnosis not present

## 2017-04-04 DIAGNOSIS — C773 Secondary and unspecified malignant neoplasm of axilla and upper limb lymph nodes: Secondary | ICD-10-CM | POA: Diagnosis not present

## 2017-04-04 DIAGNOSIS — C50212 Malignant neoplasm of upper-inner quadrant of left female breast: Secondary | ICD-10-CM

## 2017-04-04 DIAGNOSIS — Z79811 Long term (current) use of aromatase inhibitors: Secondary | ICD-10-CM

## 2017-04-04 DIAGNOSIS — D701 Agranulocytosis secondary to cancer chemotherapy: Secondary | ICD-10-CM | POA: Diagnosis not present

## 2017-04-04 DIAGNOSIS — E559 Vitamin D deficiency, unspecified: Secondary | ICD-10-CM

## 2017-04-04 DIAGNOSIS — C50919 Malignant neoplasm of unspecified site of unspecified female breast: Secondary | ICD-10-CM

## 2017-04-04 DIAGNOSIS — Z7189 Other specified counseling: Secondary | ICD-10-CM

## 2017-04-04 DIAGNOSIS — G893 Neoplasm related pain (acute) (chronic): Secondary | ICD-10-CM | POA: Diagnosis not present

## 2017-04-04 LAB — CBC & DIFF AND RETIC
BASO%: 0.6 % (ref 0.0–2.0)
Basophils Absolute: 0.1 10*3/uL (ref 0.0–0.1)
EOS%: 0.9 % (ref 0.0–7.0)
Eosinophils Absolute: 0.1 10*3/uL (ref 0.0–0.5)
HCT: 38.5 % (ref 34.8–46.6)
HEMOGLOBIN: 12.6 g/dL (ref 11.6–15.9)
IMMATURE RETIC FRACT: 12.1 % — AB (ref 1.60–10.00)
LYMPH%: 8 % — AB (ref 14.0–49.7)
MCH: 29.2 pg (ref 25.1–34.0)
MCHC: 32.7 g/dL (ref 31.5–36.0)
MCV: 89.3 fL (ref 79.5–101.0)
MONO#: 1.2 10*3/uL — AB (ref 0.1–0.9)
MONO%: 10.8 % (ref 0.0–14.0)
NEUT#: 8.6 10*3/uL — ABNORMAL HIGH (ref 1.5–6.5)
NEUT%: 79.7 % — AB (ref 38.4–76.8)
PLATELETS: 205 10*3/uL (ref 145–400)
RBC: 4.31 10*6/uL (ref 3.70–5.45)
RDW: 20.4 % — ABNORMAL HIGH (ref 11.2–14.5)
Retic %: 2.64 % — ABNORMAL HIGH (ref 0.70–2.10)
Retic Ct Abs: 113.78 10*3/uL — ABNORMAL HIGH (ref 33.70–90.70)
WBC: 10.8 10*3/uL — AB (ref 3.9–10.3)
lymph#: 0.9 10*3/uL (ref 0.9–3.3)

## 2017-04-04 LAB — COMPREHENSIVE METABOLIC PANEL
ALBUMIN: 3.1 g/dL — AB (ref 3.5–5.0)
ALK PHOS: 261 U/L — AB (ref 40–150)
ALT: 64 U/L — ABNORMAL HIGH (ref 0–55)
ANION GAP: 11 meq/L (ref 3–11)
AST: 30 U/L (ref 5–34)
BILIRUBIN TOTAL: 0.4 mg/dL (ref 0.20–1.20)
BUN: 10.5 mg/dL (ref 7.0–26.0)
CALCIUM: 8.8 mg/dL (ref 8.4–10.4)
CO2: 21 mEq/L — ABNORMAL LOW (ref 22–29)
Chloride: 108 mEq/L (ref 98–109)
Creatinine: 0.7 mg/dL (ref 0.6–1.1)
EGFR: >90 mL/min/{1.73_m2} (ref >90)
Glucose: 149 mg/dl — ABNORMAL HIGH (ref 70–140)
POTASSIUM: 3.8 meq/L (ref 3.5–5.1)
Sodium: 140 mEq/L (ref 136–145)
Total Protein: 6.7 g/dL (ref 6.4–8.3)

## 2017-04-04 LAB — MAGNESIUM: MAGNESIUM: 2.2 mg/dL (ref 1.5–2.5)

## 2017-04-04 MED ORDER — ONDANSETRON HCL 4 MG PO TABS: 4.0000 mg | ORAL_TABLET | Freq: Three times a day (TID) | ORAL | 0 refills | Status: DC | PRN

## 2017-04-04 MED ORDER — DENOSUMAB 120 MG/1.7ML ~~LOC~~ SOLN
120.0000 mg | Freq: Once | SUBCUTANEOUS | Status: AC
  Administered 2017-04-04: 120 mg via SUBCUTANEOUS
  Filled 2017-04-04: qty 1.7

## 2017-04-04 MED ORDER — LORAZEPAM 0.5 MG PO TABS: 0.5000 mg | ORAL_TABLET | Freq: Four times a day (QID) | ORAL | 0 refills | Status: DC | PRN

## 2017-04-04 NOTE — Telephone Encounter (Signed)
Gave patient AVS and calender per 5/14 los. Lab and f/u in 4 weeks .

## 2017-04-04 NOTE — Patient Instructions (Signed)

## 2017-04-04 NOTE — Patient Instructions (Signed)
Thank you for choosing [facility] to provide your oncology and hematology care.  To afford each patient quality time with our providers, please arrive 30 minutes before your scheduled appointment time.  If you arrive late for your appointment, you may be asked to reschedule.  We strive to give you quality time with our providers, and arriving late affects you and other patients whose appointments are after yours.  If you are a no show for multiple scheduled visits, you may be dismissed from the clinic at the providers discretion.   Again, thank you for choosing [facility], our hope is that these requests will decrease the amount of time that you wait before being seen by our physicians.  ______________________________________________________________________ Should you have questions after your visit to the [facility], please contact our office at [phone] between the hours of 8:30 and 4:30 p.m.    Voicemails left after 4:30p.m will not be returned until the following business day.   For prescription refill requests, please have your pharmacy contact us directly.  Please also try to allow 48 hours for prescription requests.   Please contact the scheduling department for questions regarding scheduling.  For scheduling of procedures such as PET scans, CT scans, MRI, Ultrasound, etc please contact central scheduling at [phone].   Resources For Cancer Patients and Caregivers:  American Cancer Society:  800-227-2345  Can help patients locate various types of support and financial assistance Cancer Care: 1-800-813-HOPE (4673) Provides financial assistance, online support groups, medication/co-pay assistance.   [place] DSS:  336-641-3447 Where to apply for food stamps, Medicaid, and utility assistance Medicare Rights Center: 800-333-4114 Helps people with Medicare understand their rights and benefits, navigate the Medicare system, and secure the  quality healthcare they deserve SCAT: 336-333-6589 [place] [employer]'s shared-ride transportation service for eligible riders who have a disability that prevents them from riding the fixed route bus.   For additional information on assistance programs please contact our social worker:   Grier Hock/Abigail Elmore:  [phone] 

## 2017-04-05 ENCOUNTER — Ambulatory Visit
Admission: RE | Admit: 2017-04-05 | Discharge: 2017-04-05 | Disposition: A | Discharge status: Home / Self Care | Payer: BC Managed Care – PPO | Source: Ambulatory Visit | Attending: Urology | Admitting: Urology

## 2017-04-05 ENCOUNTER — Encounter: Payer: Self-pay | Admitting: Urology

## 2017-04-05 VITALS — BP 114/69 | HR 91 | Temp 98.3°F | Resp 18 | Ht 65.0 in | Wt 161.8 lb

## 2017-04-05 DIAGNOSIS — C7951 Secondary malignant neoplasm of bone: Secondary | ICD-10-CM

## 2017-04-05 DIAGNOSIS — C50919 Malignant neoplasm of unspecified site of unspecified female breast: Secondary | ICD-10-CM | POA: Diagnosis present

## 2017-04-05 DIAGNOSIS — Z923 Personal history of irradiation: Secondary | ICD-10-CM | POA: Insufficient documentation

## 2017-04-05 LAB — PHOSPHORUS: PHOSPHORUS: 2.9 mg/dL (ref 2.5–4.5)

## 2017-04-05 NOTE — Progress Notes (Signed)
Radiation Oncology         [phone] ________________________________  Name: Stacey Cox MRN: [id number]  Date: 04/05/2017  DOB: 06/26/1956  Post Treatment Note  CC: Patient, No Pcp Per  Stacey Genera, MD  Diagnosis:   Metastatic breast cancer with metastasis to bone, localizing pain to the sacrum.  Interval Since Last Radiation: 6 weeks  02/07/2017 to 02/18/2017:   The L-Spine was treated to 30 Gy in 10 fractions at 3 Gy per fraction.    Narrative:  The patient returns today for routine follow-up.  She tolerated radiotherapy well.  At the conclusion of her treatments, she noted that she had increased mobility with significantly reduced pain.  She did experience some nausea with treatments but this was managed appropriately with Zofran prior to each treatment.                             On review of systems, the patient states that she is doing well and pain free.  She has Dilaudid to use prn but is not requiring pain medication at this point and is very pleased with her progress.  She has been able to resume her normal, routine daily activities and states that the more active she is, the better she feels.  She had to discontinue the Ribociclib due to a severe skin reaction but this has improved dramatically since discontinuing the medication.  She has also noted a significant improvement in her energy level since discontinuation.  She is on a prednisone taper and using cortisone topical ointment for the dermatitis with significant improvement.  She saw Dr. Irene Cox on 03/23/17 and plans are to start Stacey Cox once her dermatitis has resolved.  She is on Xgeva monthly and Femara daily and tolerating this well.  She has a follow up appointment with Dr. Irene Cox on 05/02/17.  ALLERGIES:  is allergic to thorazine [chlorpromazine] and ribociclib.  Meds: Current Outpatient Prescriptions  Medication Sig Dispense Refill  . ergocalciferol (VITAMIN D2) 50000 units capsule Take 1 capsule (50,000 Units  total) by mouth 2 (two) times a week. 24 capsule 0  . escitalopram (LEXAPRO) 10 MG tablet Take 1 tablet (10 mg total) by mouth daily. 30 tablet 1  . LORazepam (ATIVAN) 0.5 MG tablet Take 1 tablet (0.5 mg total) by mouth every 6 (six) hours as needed for anxiety. 60 tablet 0  . magic mouthwash w/lidocaine SOLN Take 5 mLs by mouth 4 (four) times daily as needed for mouth pain. [facility]'s formula with nystatin and lidocaine 1:1 240 mL 1  . nystatin (MYCOSTATIN) 100000 UNIT/ML suspension Take 5 mLs (500,000 Units total) by mouth 4 (four) times daily. 280 mL 0  . predniSONE (DELTASONE) 20 MG tablet 40mg  po daily x 4 days then 20mg  daily x 3days and then 10mg  po daily x 7 days(with breakfast in AM) 30 tablet 0  . Ribociclib & Letrozole (KISQALI FEMARA 400 DOSE) 200 & 2.5 MG TBPK Take 400 mg by mouth daily. Take 400mg  Kisqali daily for 21 days on, 7 days off. Take Femara 2.5mg  daily 70 each 3  . clobetasol cream (TEMOVATE) 8.41 % Apply 1 application topically 2 (two) times daily. (Patient not taking: Reported on 04/05/2017) 60 g 0  . HYDROmorphone (DILAUDID) 4 MG tablet Take 1 tablet (4 mg total) by mouth every 3 (three) hours as needed for severe pain. (Patient not taking: Reported on 04/05/2017) 60 tablet 0  . ondansetron (ZOFRAN) 4 MG  tablet Take 1 tablet (4 mg total) by mouth every 8 (eight) hours as needed for nausea or vomiting. (Patient not taking: Reported on 04/05/2017) 20 tablet 1  . ondansetron (ZOFRAN) 4 MG tablet Take 1 tablet (4 mg total) by mouth every 8 (eight) hours as needed for nausea or vomiting. (Patient not taking: Reported on 04/05/2017) 30 tablet 0  . polyethylene glycol (MIRALAX) packet Take 17 g by mouth daily. (Patient not taking: Reported on 02/05/2017) 30 each 1  . senna-docusate (SENNA S) 8.6-50 MG tablet Take 2 tablets by mouth 2 (two) times daily. (Patient not taking: Reported on 04/05/2017) 60 tablet 1   No current facility-administered medications for this encounter.     Physical  Findings:  height is 5\' 5"  (1.651 m) and weight is 161 lb 12.8 oz (73.4 kg). Her oral temperature is 98.3 F (36.8 C). Her blood pressure is 114/69 and her pulse is 91. Her respiration is 18 and oxygen saturation is 97%.  Pain Assessment Pain Score: 1  (Left leg tingling)/10 In general this is a well appearing african american woman in no acute distress. She's alert and oriented x4 and appropriate throughout the examination. Cardiopulmonary assessment is negative for acute distress and she exhibits normal effort. No evidence of oral thrush on exam.  Lab Findings: Lab Results  Component Value Date   WBC 10.8 (H) 04/04/2017   HGB 12.6 04/04/2017   HCT 38.5 04/04/2017   MCV 89.3 04/04/2017   PLT 205 04/04/2017     Radiographic Findings: No results found.  Impression/Plan: 1. Metastatic breast cancer with metastasis to bone, localizing pain to the sacrum. She has had dramatic improvement of her low back pain s/p recent radiotherapy and is quite pleased with her progress.  She is not requiring the use of pain medication currently.  We are happy to continue to participate in her care as needed but at this point, she will continue routine follow up with Dr. Irene Cox. She knows to call or contact us with any questions or concerns related to her radiation treatments.      Stacey Johns, PA-C

## 2017-04-05 NOTE — Progress Notes (Signed)
Stacey Cox    HEMATOLOGY/ONCOLOGY CLINIC NOTE  Date of Service: .04/04/2017  Patient Care Team: Patient, No Pcp Per as PCP - General (General Practice)  CHIEF COMPLAINTS/PURPOSE OF CONSULTATION:  Newly diagnosed metastatic breast cancer   HISTORY OF PRESENTING ILLNESS:   Stacey Cox is a wonderful 61 y.o. female who has been referred to [place] by Dr Lucious Groves, DO  for evaluation and management of likely metastatic breast cancer.  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 She also notes having developed left lower inner breast discomfort and  Ulceration over the last several weeks.  Patient has a CT abd renal proctocol to R/o kidney stones that showed Multiple metastasis in the thoracic and lumbar spine included LEFT L3 transverse process and RIGHT T12 transverse pathologic fractures. Multiple pulmonary nodules measuring to 9 mm.   Patient notes no weight loss no night sweat. Last mammogram >10 yrs ago.  No headaches no overt FND FHx of breast cancer in her mother and sister.  Patient noted that she has a phobia of the medical system and therefore had not actively sought a PCP or routine cares. She was anxious about how she how be able to management her live "alone". Supportive counseling was provided.  A CT chest, bone scan and MRI brain were recommended to complete staging workup.  CT chest was done which showed Primary left breast cancer with left axillary and subpectoral adenopathy.  Metastatic disease in the thorax with multiple pulmonary nodules mild mediastinal adenopathy. Left hilar soft tissue prominence may be central pulmonary nodule or adenopathy. Osseous metastatic disease with mild T6 compression fracture with lucent lesion anteriorly and lucencies in the posterior elements of T9 through T12. Fracture of right anterior rib has surrounding callus is likely a pathologic  fracture.  Bone scan was done which showed significant osseous metastatic disease involving spine, calvarium and rt ribs cage.  Patient was admitted for symptoms control and expedited workup. She has been seen by Surgery.  Patient subsequently has had mammogram which showed Within the medial aspect of the left breast there is a 4.1 cm ulcerated mass. Slightly anterior to this within the medial left breast middle depth is a 2.3 cm oval lobular mass. Within the upper inner left breast there is a 1.4 cm mass with associated distortion. Within the slightly medial right breast middle depth there is a 0.8 m oval circumscribed mass. Multiple prominent left axillary lymph nodes are demonstrated on mammography.  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 No DCIS noted. Lymph node was positive for metastatic carcinoma consistent with breast primary.  Patient notes difficult to control lower back pain despite OxyContin and when necessary oxycodone. She is agreeable to getting a referral to radiation oncology for palliative radiation to her significantly symptomatic spinal metastases.  INTERVAL HISTORY  Prof [place] is here for follow-up of her metastatic hormone positive HER-2 negative breast cancer and re-evaluation of her drug reaction. Patient continues to be off her Ribociclib and her exfoliative dermatitis involving the upper extremities and trunk have nearly resolved and she has some in her feet which is improving. She has not been seen by the dermatologist yet but has been following with one of her friends who  is a Paediatric nurse out of [place] and she'll be seeing her in person soon. She is in good spirits and notes that her energy levels have improved significantly off the Ribociclib. She is eating better and has  no nausea. No other acute new concerns. Continues to take her letrozole. She is on a tapering dose of her steroids. No new focal symptoms. No back pains.  MEDICAL HISTORY:  Past Medical History:  Diagnosis Date  . Cancer [facility])    Metastatic Breast Cancer    SURGICAL HISTORY: Past Surgical History:  Procedure Laterality Date  . CHOLECYSTECTOMY    . TONSILLECTOMY      SOCIAL HISTORY: Social History   Social History  . Marital status: Single    Spouse name: N/A  . Number of children: N/A  . Years of education: N/A   Occupational History  . Not on file.   Social History Main Topics  . Smoking status: Former Smoker    Packs/day: 0.00    Types: Cigarettes    Quit date: 01/21/2017  . Smokeless tobacco: Never Used  . Alcohol use No  . Drug use: No  . Sexual activity: No   Other Topics Concern  . Not on file   Social History Narrative  . 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 Cancer Mother        breast  . Cancer Sister        breast  Mother with h/o breast cancer in her 31's Sister with breast cancer in her 56ys and later was diagnosed with multiple myeloma. (Patient is not aware of any specific breast cancer mutations present)  ALLERGIES:  is allergic to thorazine [chlorpromazine] and ribociclib.  MEDICATIONS:  Current Outpatient Prescriptions  Medication Sig Dispense Refill  . clobetasol cream (TEMOVATE) 6.83 % Apply 1 application topically 2 (two) times daily. (Patient not taking: Reported on 04/05/2017) 60 g 0  . ergocalciferol (VITAMIN D2) 50000 units capsule Take 1 capsule (50,000 Units total) by mouth 2 (two) times a week. 24 capsule 0  . escitalopram (LEXAPRO) 10 MG tablet Take 1 tablet (10 mg total) by mouth daily. 30 tablet 1  . HYDROmorphone (DILAUDID) 4 MG tablet Take 1 tablet (4 mg total)  by mouth every 3 (three) hours as needed for severe pain. (Patient not taking: Reported on 04/05/2017) 60 tablet 0  . LORazepam (ATIVAN) 0.5 MG tablet Take 1 tablet (0.5 mg total) by mouth every 6 (six) hours as needed for anxiety. 60 tablet 0  . magic mouthwash w/lidocaine SOLN Take 5 mLs by mouth 4 (four) times daily as needed for mouth pain. Duke's formula with nystatin and lidocaine 1:1 240 mL 1  . nystatin (MYCOSTATIN) 100000 UNIT/ML suspension Take 5 mLs (500,000 Units total) by mouth 4 (four) times daily. 280 mL 0  . ondansetron (ZOFRAN) 4 MG tablet Take 1 tablet (4 mg total) by mouth every 8 (eight) hours as needed for nausea or vomiting. (Patient not taking: Reported on 04/05/2017) 20 tablet 1  . ondansetron (ZOFRAN) 4 MG tablet Take 1 tablet (4 mg total) by mouth every 8 (eight) hours as needed for nausea or vomiting. (Patient not taking: Reported on 04/05/2017) 30 tablet 0  . polyethylene glycol (MIRALAX) packet Take 17 g by mouth daily. (Patient not taking: Reported on 02/05/2017) 30 each 1  .  predniSONE (DELTASONE) 20 MG tablet 61m po daily x 4 days then 240mdaily x 3days and then 1060mo daily x 7 days(with breakfast in AM) 30 tablet 0  . Ribociclib & Letrozole (KISQALI FEMARA 400 DOSE) 200 & 2.5 MG TBPK Take 400 mg by mouth daily. Take 400m8msqali daily for 21 days on, 7 days off. Take Femara 2.5mg 16mly 70 each 3  . senna-docusate (SENNA S) 8.6-50 MG tablet Take 2 tablets by mouth 2 (two) times daily. (Patient not taking: Reported on 04/05/2017) 60 tablet 1   No current facility-administered medications for this visit.     REVIEW OF SYSTEMS:    10 Point review of Systems was done is negative except as noted above.  PHYSICAL EXAMINATION: ECOG PERFORMANCE STATUS: 1 - Symptomatic but completely ambulatory  . Vitals:   04/04/17 1329  BP: 132/88  Pulse: 83  Resp: 18  Temp: 99.1 F (37.3 C)   Filed Weights   04/04/17 1329  Weight: 162 lb 9.6 oz (73.8 kg)   .Body mass index  is 27.06 kg/m.  GENERAL:alert, in no acute distress and comfortable SKIN: Exfoliative dermatitis involving the hands, upper extremities chest and back has significantly improved and she has some grade 1-2 exfoliation in her feet. EYES: conjunctiva are pink and non-injected, sclera anicteric OROPHARYNX: MMM, no exudates, no oropharyngeal erythema or ulceration NECK: supple, no JVD LYMPH:  no palpable lymphadenopathy in the cervical, axillary or inguinal regions LUNGS: clear to auscultation b/l with normal respiratory effort HEART: regular rate & rhythm ABDOMEN:  normoactive bowel sounds , non tender, not distended. Extremity: no pedal edema PSYCH: alert & oriented x 3 with fluent speech NEURO: no focal motor/sensory deficits Breast examination was done recently in the hospital and not repeated today  LABORATORY DATA:  I have reviewed the data as listed  . CBC Latest Ref Rng & Units 04/04/2017 03/07/2017 02/14/2017  WBC 3.9 - 10.3 10e3/uL 10.8(H) 1.1(L) 12.9(H)  Hemoglobin 11.6 - 15.9 g/dL 12.6 12.0 14.2  Hematocrit 34.8 - 46.6 % 38.5 36.1 43.1  Platelets 145 - 400 10e3/uL 205 90 Large platelets present(L) 289   ANC 0.8  . CMP Latest Ref Rng & Units 04/04/2017 03/07/2017 02/14/2017  Glucose 70 - 140 mg/dl 149(H) 116 162(H)  BUN 7.0 - 26.0 mg/dL 10.5 6.4(L) 17.1  Creatinine 0.6 - 1.1 mg/dL 0.7 0.8 1.1  Sodium 136 - 145 mEq/L 140 141 136  Potassium 3.5 - 5.1 mEq/L 3.8 4.1 4.4  Chloride 101 - 111 mmol/L - - -  CO2 22 - 29 mEq/L 21(L) 23 25  Calcium 8.4 - 10.4 mg/dL 8.8 8.2(L) 8.7  Total Protein 6.4 - 8.3 g/dL 6.7 6.1(L) 7.3  Total Bilirubin 0.20 - 1.20 mg/dL 0.40 0.34 0.92  Alkaline Phos 40 - 150 U/L 261(H) 237(H) 182(H)  AST 5 - 34 U/L 30 32 15  ALT 0 - 55 U/L 64(H) 82(H) 32   Component     Latest Ref Rng & Units 01/21/2017 02/07/2017  HIV     Non Reactive Non Reactive   CA 15-3     0.0 - 25.0 U/mL 32.4 (H)   CA 27.29     0.0 - 38.6 U/mL 37.6   Vitamin D, 25-Hydroxy     30.0  - 100.0 ng/mL  14.2 (L)  Phosphorus     2.5 - 4.5 mg/dL  3.5            RADIOGRAPHIC STUDIES: I have personally reviewed the radiological images  as listed and agreed with the findings in the report. No results found.  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 Multifocal tumor in the left breast with biopsy-proven left axillary metastases. Noted to have extensive bone metastases and pulmonary metastases. 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  #5 Cancer related pain - controlled - status post palliative RT to spine - no currently needing any pain medications.  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 No other new medication. Rash on the UE and trunk nearly resolved and about Grade 1-2 in her feet. Plan -continue to hold RIbociclib -complete Po prednisone taper -topical emollients petroleum jelly/Udder cream and topical steroids -given referral to dermatology but no available appointment till 04/25/2017--- she has been corresponding with and managing treatment as per her friend who is a Paediatric nurse in [place], [place]. She intends to visit her in [place] as well. -Continue daily letrozole 2.5 mg. -Continue on Xgeva every 4 weeks. -Since her pain is well-controlled only on when necessary Dilaudid for breakthrough pain. -will plan to consider reintroducing Ibrance gradually once rash resolves. -recheck CA 15-3, ca 27-29 next visit.  #6 vitamin D deficiency #7 Hypophophatemia- resolved due to vitamin D deficiency and Ribociclib -Aggressive treatment with ergocalciferol 50,000 units increased to twice weekly. -Counseled to increase  calcium  in her diet- with consumption of milk cheese yogurt and nuts. -Phosphorus level WNL today -recheck vit D levels next visit  RTC with Dr Irene Limbo in 4-5 weeks with labs  All of the patients questions were answered with apparent satisfaction. The patient knows to call the clinic with any problems, questions or concerns.  I spent 20 minutes counseling the patient face to face. The total time spent in the appointment was 25 minutes and more than 50% was on counseling and direct patient cares.    Sullivan Lone MD [place] AAHIVMS [facility] Hematology/Oncology Physician [facility]  (Office):       [phone] (Work cell):  [phone] (Fax):           [phone]

## 2017-04-06 NOTE — Addendum Note (Signed)
Encounter addended by: Malena Edman, RN on: 04/06/2017 10:46 AM<BR>    Actions taken: Charge Capture section accepted

## 2017-04-07 ENCOUNTER — Telehealth: Payer: Self-pay

## 2017-04-07 NOTE — Telephone Encounter (Signed)
Do you have another dermatologist in mind MD Irene Limbo or can she call around to find another dermatologist that can see her sooner?

## 2017-04-07 NOTE — Telephone Encounter (Signed)
Henrietta Hoover, pt sister called with a question. There is no hippa form with names of people we can talk with. Called pt and received verbal permission to talk with Stacey Cox.  Also pt stated she will fill out the form on her next visit.  Dr Irene Limbo referred pt to a dermatologist, she cannot get in until June 13th. Is there another dermatologist where she can get in sooner?

## 2017-04-18 DIAGNOSIS — Z515 Encounter for palliative care: Secondary | ICD-10-CM | POA: Insufficient documentation

## 2017-04-28 ENCOUNTER — Other Ambulatory Visit: Payer: Self-pay | Admitting: Hematology

## 2017-05-02 ENCOUNTER — Other Ambulatory Visit (HOSPITAL_BASED_OUTPATIENT_CLINIC_OR_DEPARTMENT_OTHER): Payer: BC Managed Care – PPO

## 2017-05-02 ENCOUNTER — Ambulatory Visit (HOSPITAL_BASED_OUTPATIENT_CLINIC_OR_DEPARTMENT_OTHER): Payer: BC Managed Care – PPO

## 2017-05-02 ENCOUNTER — Ambulatory Visit (HOSPITAL_BASED_OUTPATIENT_CLINIC_OR_DEPARTMENT_OTHER): Payer: BC Managed Care – PPO | Admitting: Hematology

## 2017-05-02 ENCOUNTER — Ambulatory Visit: Payer: BC Managed Care – PPO

## 2017-05-02 VITALS — BP 122/77 | HR 98 | Temp 98.4°F | Resp 18 | Ht 65.0 in | Wt 170.7 lb

## 2017-05-02 DIAGNOSIS — C50812 Malignant neoplasm of overlapping sites of left female breast: Secondary | ICD-10-CM

## 2017-05-02 DIAGNOSIS — C50919 Malignant neoplasm of unspecified site of unspecified female breast: Secondary | ICD-10-CM

## 2017-05-02 DIAGNOSIS — Z170 Estrogen receptor positive status [ER+]: Secondary | ICD-10-CM | POA: Diagnosis not present

## 2017-05-02 DIAGNOSIS — Z7189 Other specified counseling: Secondary | ICD-10-CM

## 2017-05-02 DIAGNOSIS — L26 Exfoliative dermatitis: Secondary | ICD-10-CM

## 2017-05-02 DIAGNOSIS — C50212 Malignant neoplasm of upper-inner quadrant of left female breast: Secondary | ICD-10-CM

## 2017-05-02 DIAGNOSIS — C773 Secondary and unspecified malignant neoplasm of axilla and upper limb lymph nodes: Secondary | ICD-10-CM | POA: Diagnosis not present

## 2017-05-02 DIAGNOSIS — G893 Neoplasm related pain (acute) (chronic): Secondary | ICD-10-CM

## 2017-05-02 DIAGNOSIS — C7800 Secondary malignant neoplasm of unspecified lung: Secondary | ICD-10-CM

## 2017-05-02 DIAGNOSIS — C7951 Secondary malignant neoplasm of bone: Secondary | ICD-10-CM

## 2017-05-02 DIAGNOSIS — E559 Vitamin D deficiency, unspecified: Secondary | ICD-10-CM

## 2017-05-02 LAB — CBC & DIFF AND RETIC
BASO%: 0.5 % (ref 0.0–2.0)
BASOS ABS: 0 10*3/uL (ref 0.0–0.1)
EOS%: 2.4 % (ref 0.0–7.0)
Eosinophils Absolute: 0.2 10*3/uL (ref 0.0–0.5)
HEMATOCRIT: 45 % (ref 34.8–46.6)
HGB: 14.5 g/dL (ref 11.6–15.9)
Immature Retic Fract: 6.7 % (ref 1.60–10.00)
LYMPH%: 22.4 % (ref 14.0–49.7)
MCH: 29.4 pg (ref 25.1–34.0)
MCHC: 32.2 g/dL (ref 31.5–36.0)
MCV: 91.3 fL (ref 79.5–101.0)
MONO#: 0.5 10*3/uL (ref 0.1–0.9)
MONO%: 7.3 % (ref 0.0–14.0)
NEUT#: 4.3 10*3/uL (ref 1.5–6.5)
NEUT%: 67.4 % (ref 38.4–76.8)
PLATELETS: 211 10*3/uL (ref 145–400)
RBC: 4.93 10*6/uL (ref 3.70–5.45)
RDW: 18.3 % — AB (ref 11.2–14.5)
Retic %: 2.18 % — ABNORMAL HIGH (ref 0.70–2.10)
Retic Ct Abs: 107.47 10*3/uL — ABNORMAL HIGH (ref 33.70–90.70)
WBC: 6.3 10*3/uL (ref 3.9–10.3)
lymph#: 1.4 10*3/uL (ref 0.9–3.3)

## 2017-05-02 LAB — COMPREHENSIVE METABOLIC PANEL
ALK PHOS: 180 U/L — AB (ref 40–150)
ALT: 40 U/L (ref 0–55)
AST: 30 U/L (ref 5–34)
Albumin: 3.5 g/dL (ref 3.5–5.0)
Anion Gap: 11 mEq/L (ref 3–11)
BUN: 10.4 mg/dL (ref 7.0–26.0)
CALCIUM: 8.9 mg/dL (ref 8.4–10.4)
CHLORIDE: 108 meq/L (ref 98–109)
CO2: 23 mEq/L (ref 22–29)
Creatinine: 0.8 mg/dL (ref 0.6–1.1)
EGFR: >90 mL/min/{1.73_m2} (ref >90)
Glucose: 124 mg/dl (ref 70–140)
POTASSIUM: 3.8 meq/L (ref 3.5–5.1)
Sodium: 141 mEq/L (ref 136–145)
Total Bilirubin: 0.47 mg/dL (ref 0.20–1.20)
Total Protein: 7 g/dL (ref 6.4–8.3)

## 2017-05-02 MED ORDER — ABEMACICLIB 50 MG PO TABS: 50.0000 mg | ORAL_TABLET | Freq: Two times a day (BID) | ORAL | 1 refills | Status: DC

## 2017-05-02 MED ORDER — DENOSUMAB 120 MG/1.7ML ~~LOC~~ SOLN
120.0000 mg | Freq: Once | SUBCUTANEOUS | Status: AC
  Administered 2017-05-02: 120 mg via SUBCUTANEOUS
  Filled 2017-05-02: qty 1.7

## 2017-05-02 NOTE — Patient Instructions (Signed)

## 2017-05-03 LAB — VITAMIN D 25 HYDROXY (VIT D DEFICIENCY, FRACTURES): Vitamin D, 25-Hydroxy: 21.6 ng/mL — ABNORMAL LOW (ref 30.0–100.0)

## 2017-05-03 LAB — CANCER ANTIGEN 27.29: CA 27.29: 16.3 U/mL (ref 0.0–38.6)

## 2017-05-03 LAB — CANCER ANTIGEN 15-3: CAN 15 3: 14.7 U/mL (ref 0.0–25.0)

## 2017-05-04 ENCOUNTER — Telehealth: Payer: Self-pay | Admitting: Hematology

## 2017-05-04 NOTE — Telephone Encounter (Signed)
Scheduled appt per 6/11 los - Left message with appt time and date and sent reminder letter.

## 2017-05-11 ENCOUNTER — Telehealth: Payer: Self-pay | Admitting: Pharmacist

## 2017-05-11 ENCOUNTER — Other Ambulatory Visit: Payer: Self-pay | Admitting: *Deleted

## 2017-05-11 DIAGNOSIS — C50919 Malignant neoplasm of unspecified site of unspecified female breast: Secondary | ICD-10-CM

## 2017-05-11 MED ORDER — ABEMACICLIB 50 MG PO TABS: 50.0000 mg | ORAL_TABLET | Freq: Two times a day (BID) | ORAL | 1 refills | Status: DC

## 2017-05-11 MED ORDER — ERGOCALCIFEROL 1.25 MG (50000 UT) PO CAPS: ORAL_CAPSULE | ORAL | 1 refills | Status: DC

## 2017-05-11 NOTE — Telephone Encounter (Signed)
Oral Chemotherapy Pharmacist Encounter  Received new prescription for Verzenio for the treatment of metastatic breast cancer in a patient who experienced exfoliative dermatitis with Kisqali treatment.  05/02/17 labs reviewed, OK for treatment Current medication list in Epic assessed, no DDIs with Verzenio identified  Prior authorization approval from [facility] Effective dates: 05/11/17-05/11/18 Ref# [id number] Prescription must be filled through a [facility] per insurance requirement.  Prescription has been [facility] in [place], [place] (ph: [phone]).  Oral oncology Clinic will continue to follow.  Thank you,  Johny Drilling, PharmD, BCPS, BCOP 05/11/2017  12:14 PM Oral Oncology Clinic [phone]

## 2017-05-13 ENCOUNTER — Telehealth: Payer: Self-pay

## 2017-05-13 NOTE — Telephone Encounter (Signed)
Pt called stating she received her new med on Saturday and was wanting instruction on how to use it. LVM with Evelena Peat PharmD.

## 2017-05-16 ENCOUNTER — Telehealth: Payer: Self-pay

## 2017-05-16 MED ORDER — LETROZOLE 2.5 MG PO TABS: 2.5000 mg | ORAL_TABLET | Freq: Every day | ORAL | 6 refills | Status: DC

## 2017-05-16 NOTE — Telephone Encounter (Signed)
Returned phone call concerning thorazine orders for home use. Dr. Irene Limbo would llike for the pt to take 50mg  daily. Order options only allowed to be prescribed as twice daily. Pt verbalized understanding.

## 2017-05-16 NOTE — Telephone Encounter (Signed)
Oral Chemotherapy Pharmacist Encounter   I spoke with patient for overview of new oral chemotherapy medication: Verzenio. Pt is doing well. The prescription is being filled through [facility] in [place], [place].  Patient received Verzenio shipment last week and plans to start taking it tomorrow 05/17/17.  Counseled patient on administration, dosing, side effects, safe handling, and monitoring. Patient will take 50mg  tablet by mouth twice daily. Patient understands her dose will likely increase if tolerated as MD started at a low dose due to intolerance to [facility]. Patient will continue to take Femara 2.5mg  tablet by mouth once daily. A new prescription for Femara has been e-scribed to [facility] on [address] per patient preference.  Side effects include but not limited to: diarrhea, N/V, fatigue, headache, and decreased blood counts.  Ms. Herald voiced understanding and appreciation. She already has an over the counter anti-diarrheal on hand.  All questions answered.  Oral Oncology Clinic will continue to follow.  Thank you,  Johny Drilling, PharmD, BCPS, BCOP 05/16/2017  12:33 PM Oral Oncology Clinic [phone]

## 2017-05-16 NOTE — Telephone Encounter (Signed)
Pt had questions about starting verzenio. She is wanting to have a prescription strength antidiarrheal at home before starting. She has verzenio at home as of late last week.  What is the schedule of verzenio? Will she have a week off? Can the rx have refills next time?   [facility] [address]

## 2017-05-22 NOTE — Progress Notes (Signed)
Stacey Cox    HEMATOLOGY/ONCOLOGY CLINIC NOTE  Date of Service: .05/02/2017  Patient Care Team: Patient, No Pcp Per as PCP - General (General Practice)  CHIEF COMPLAINTS/PURPOSE OF CONSULTATION:  F/u for breast cancer  HISTORY OF PRESENTING ILLNESS:   plz see previous notes for details.  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 Patient continues to be off her Ribociclib and her exfoliative dermatitis involving the upper extremities and trunk has resolved. She is doing well and is in good spirits overall. We discussed continuing AI alone versus trying Verzenio. We discussed that it is uncertain if she would cross react with this medication the same way. We would start her at the the lowest dose and increase the dose gradually if tolerated. She was instruction to hold the medication at the first sign of skin reaction and report this.   MEDICAL HISTORY:  Past Medical History:  Diagnosis Date  . Cancer [facility])    Metastatic Breast Cancer    SURGICAL HISTORY: Past Surgical History:  Procedure Laterality Date  . CHOLECYSTECTOMY    . TONSILLECTOMY      SOCIAL HISTORY: Social History   Social History  . Marital status: Single    Spouse name: N/A  . Number of children: N/A  . Years of education: N/A   Occupational History  . Not on file.   Social History Main Topics  . Smoking status: Former Smoker    Packs/day: 0.00    Types: Cigarettes    Quit date: 01/21/2017  . Smokeless tobacco: Never Used  . Alcohol use No  . Drug use: No  . Sexual activity: No   Other Topics Concern  . Not on file   Social History Narrative  . 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 Cancer Mother        breast  . Cancer  Sister        breast  Mother with h/o breast cancer in her 65's Sister with breast cancer in her 38ys and later was diagnosed with multiple myeloma. (Patient is not aware of any specific breast cancer mutations present)  ALLERGIES:  is allergic to thorazine [chlorpromazine] and ribociclib.  MEDICATIONS:  Current Outpatient Prescriptions  Medication Sig Dispense Refill  . urea (CARMOL) 40 % CREA Apply topically daily.    . Abemaciclib (VERZENIO) 50 MG TABS Take 50 mg by mouth 2 (two) times daily. 60 tablet 1  . ergocalciferol (VITAMIN D2) 50000 units capsule Take one capsule by mouth three times weekly. 24 capsule 1  . escitalopram (LEXAPRO) 10 MG tablet TAKE 1 TABLET BY MOUTH DAILY 30 tablet 1  . letrozole (FEMARA) 2.5 MG tablet Take 1 tablet (2.5 mg total) by mouth daily. 30 tablet 6  . LORazepam (ATIVAN) 0.5 MG tablet Take 1 tablet (0.5 mg total) by mouth every 6 (six) hours as needed for anxiety. 60 tablet 0  . magic mouthwash w/lidocaine SOLN Take 5 mLs by mouth 4 (four) times daily as needed for mouth pain. Duke's formula with nystatin and lidocaine 1:1 240 mL 1  . ondansetron (ZOFRAN) 4 MG tablet Take 1 tablet (4 mg total) by mouth every 8 (eight) hours as needed for nausea or vomiting. 20 tablet 1   No  current facility-administered medications for this visit.     REVIEW OF SYSTEMS:    10 Point review of Systems was done is negative except as noted above.  PHYSICAL EXAMINATION: ECOG PERFORMANCE STATUS: 1 - Symptomatic but completely ambulatory  . Vitals:   05/02/17 1407  BP: 122/77  Pulse: 98  Resp: 18  Temp: 98.4 F (36.9 C)   Filed Weights   05/02/17 1407  Weight: 170 lb 11.2 oz (77.4 kg)   .Body mass index is 28.41 kg/m.  GENERAL:alert, in no acute distress and comfortable SKIN: Exfoliative dermatitis involving the hands, upper extremities chest and back has significantly improved and she has some grade 1-2 exfoliation in her feet. EYES: conjunctiva are pink  and non-injected, sclera anicteric OROPHARYNX: MMM, no exudates, no oropharyngeal erythema or ulceration NECK: supple, no JVD LYMPH:  no palpable lymphadenopathy in the cervical, axillary or inguinal regions LUNGS: clear to auscultation b/l with normal respiratory effort HEART: regular rate & rhythm ABDOMEN:  normoactive bowel sounds , non tender, not distended. Extremity: no pedal edema PSYCH: alert & oriented x 3 with fluent speech NEURO: no focal motor/sensory deficits Breast examination was done recently in the hospital and not repeated today  LABORATORY DATA:  I have reviewed the data as listed  . CBC Latest Ref Rng & Units 05/02/2017 04/04/2017 03/07/2017  WBC 3.9 - 10.3 10e3/uL 6.3 10.8(H) 1.1(L)  Hemoglobin 11.6 - 15.9 g/dL 14.5 12.6 12.0  Hematocrit 34.8 - 46.6 % 45.0 38.5 36.1  Platelets 145 - 400 10e3/uL 211 205 90 Large platelets present(L)   ANC 0.8  . CMP Latest Ref Rng & Units 05/02/2017 04/04/2017 03/07/2017  Glucose 70 - 140 mg/dl 124 149(H) 116  BUN 7.0 - 26.0 mg/dL 10.4 10.5 6.4(L)  Creatinine 0.6 - 1.1 mg/dL 0.8 0.7 0.8  Sodium 136 - 145 mEq/L 141 140 141  Potassium 3.5 - 5.1 mEq/L 3.8 3.8 4.1  Chloride 101 - 111 mmol/L - - -  CO2 22 - 29 mEq/L 23 21(L) 23  Calcium 8.4 - 10.4 mg/dL 8.9 8.8 8.2(L)  Total Protein 6.4 - 8.3 g/dL 7.0 6.7 6.1(L)  Total Bilirubin 0.20 - 1.20 mg/dL 0.47 0.40 0.34  Alkaline Phos 40 - 150 U/L 180(H) 261(H) 237(H)  AST 5 - 34 U/L 30 30 32  ALT 0 - 55 U/L 40 64(H) 82(H)   Component     Latest Ref Rng & Units 01/21/2017 02/07/2017  HIV     Non Reactive Non Reactive   CA 15-3     0.0 - 25.0 U/mL 32.4 (H)   CA 27.29     0.0 - 38.6 U/mL 37.6   Vitamin D, 25-Hydroxy     30.0 - 100.0 ng/mL  14.2 (L)  Phosphorus     2.5 - 4.5 mg/dL  3.5            RADIOGRAPHIC STUDIES: I have personally reviewed the radiological images as listed and agreed with the findings in the report. No results found.  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 Multifocal tumor in the left breast with biopsy-proven left axillary metastases. Noted to have extensive bone metastases and pulmonary metastases. 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  #5 Cancer related pain - controlled - status post palliative RT to spine - no currently needing any pain medications.  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 No other new medication. Now resolved  Plan -patients rash is resolved. -she will continue daily letrozole 2.5 mg. -Continue on Xgeva every 4 weeks. -We discussed continuing AI alone versus trying Verzenio. We discussed that it is uncertain if she would cross react with this medication the same way. We would start her at the the lowest dose and increase the dose gradually if tolerated. She was instruction to hold the medication at the first sign of skin reaction and report this. -will start on Verzenio 76m po daily and then increase to 526mpo BID in 1 week if not skin reaction or other toxicity.  #6 vitamin D deficiency up from 14 to 21.6  #7 Hypophophatemia- resolved due to vitamin D deficiency and Ribociclib -Aggressive treatment with ergocalciferol 50,000 units -Counseled to increase calcium  in her diet- with consumption of milk cheese yogurt and nuts.  continue XgMarchelle FolksRTC with Dr KaIrene Limbon 4 weeks with labs for toxicity check with   All of the patients questions were answered with apparent satisfaction. The patient knows to call the clinic with any problems, questions or concerns.  I spent 20 minutes counseling the patient face to face. The total time spent in the appointment was  25 minutes and more than 50% was on counseling and direct patient cares.    GaSullivan LoneD MSVelda Village HillsAHIVMS SCCrystal Run Ambulatory SurgeryTCentra Specialty Hospitalematology/Oncology Physician [facility](Office):       [phone] cell):  [phone]):           [phone]

## 2017-05-30 ENCOUNTER — Ambulatory Visit: Payer: BC Managed Care – PPO

## 2017-06-07 ENCOUNTER — Other Ambulatory Visit: Payer: Self-pay

## 2017-06-07 ENCOUNTER — Telehealth: Payer: Self-pay | Admitting: Hematology

## 2017-06-07 ENCOUNTER — Ambulatory Visit (HOSPITAL_BASED_OUTPATIENT_CLINIC_OR_DEPARTMENT_OTHER): Payer: BC Managed Care – PPO | Admitting: Hematology

## 2017-06-07 ENCOUNTER — Other Ambulatory Visit (HOSPITAL_BASED_OUTPATIENT_CLINIC_OR_DEPARTMENT_OTHER): Payer: BC Managed Care – PPO

## 2017-06-07 ENCOUNTER — Ambulatory Visit (HOSPITAL_BASED_OUTPATIENT_CLINIC_OR_DEPARTMENT_OTHER): Payer: BC Managed Care – PPO

## 2017-06-07 ENCOUNTER — Encounter: Payer: Self-pay | Admitting: Hematology

## 2017-06-07 VITALS — BP 130/93 | HR 70 | Temp 98.7°F | Resp 18 | Ht 65.0 in | Wt 171.3 lb

## 2017-06-07 DIAGNOSIS — G893 Neoplasm related pain (acute) (chronic): Secondary | ICD-10-CM | POA: Diagnosis not present

## 2017-06-07 DIAGNOSIS — C7951 Secondary malignant neoplasm of bone: Secondary | ICD-10-CM

## 2017-06-07 DIAGNOSIS — L26 Exfoliative dermatitis: Secondary | ICD-10-CM

## 2017-06-07 DIAGNOSIS — C50812 Malignant neoplasm of overlapping sites of left female breast: Secondary | ICD-10-CM

## 2017-06-07 DIAGNOSIS — E559 Vitamin D deficiency, unspecified: Secondary | ICD-10-CM

## 2017-06-07 DIAGNOSIS — Z170 Estrogen receptor positive status [ER+]: Secondary | ICD-10-CM

## 2017-06-07 DIAGNOSIS — C50919 Malignant neoplasm of unspecified site of unspecified female breast: Secondary | ICD-10-CM

## 2017-06-07 DIAGNOSIS — C773 Secondary and unspecified malignant neoplasm of axilla and upper limb lymph nodes: Secondary | ICD-10-CM

## 2017-06-07 DIAGNOSIS — Z7189 Other specified counseling: Secondary | ICD-10-CM

## 2017-06-07 LAB — COMPREHENSIVE METABOLIC PANEL
ALK PHOS: 128 U/L (ref 40–150)
ALT: 28 U/L (ref 0–55)
AST: 21 U/L (ref 5–34)
Albumin: 3.6 g/dL (ref 3.5–5.0)
Anion Gap: 13 mEq/L — ABNORMAL HIGH (ref 3–11)
BUN: 10.9 mg/dL (ref 7.0–26.0)
CO2: 22 mEq/L (ref 22–29)
Calcium: 9.2 mg/dL (ref 8.4–10.4)
Chloride: 107 mEq/L (ref 98–109)
Creatinine: 1 mg/dL (ref 0.6–1.1)
EGFR: 69 mL/min/{1.73_m2} — ABNORMAL LOW (ref >90)
GLUCOSE: 148 mg/dL — AB (ref 70–140)
POTASSIUM: 3.6 meq/L (ref 3.5–5.1)
SODIUM: 142 meq/L (ref 136–145)
Total Bilirubin: 0.38 mg/dL (ref 0.20–1.20)
Total Protein: 6.8 g/dL (ref 6.4–8.3)

## 2017-06-07 LAB — CBC & DIFF AND RETIC
BASO%: 0.5 % (ref 0.0–2.0)
BASOS ABS: 0 10*3/uL (ref 0.0–0.1)
EOS ABS: 0.1 10*3/uL (ref 0.0–0.5)
EOS%: 1.8 % (ref 0.0–7.0)
HCT: 43.5 % (ref 34.8–46.6)
HEMOGLOBIN: 14.2 g/dL (ref 11.6–15.9)
IMMATURE RETIC FRACT: 4.6 % (ref 1.60–10.00)
LYMPH#: 1.3 10*3/uL (ref 0.9–3.3)
LYMPH%: 30.2 % (ref 14.0–49.7)
MCH: 29.3 pg (ref 25.1–34.0)
MCHC: 32.6 g/dL (ref 31.5–36.0)
MCV: 89.7 fL (ref 79.5–101.0)
MONO#: 0.3 10*3/uL (ref 0.1–0.9)
MONO%: 6.8 % (ref 0.0–14.0)
NEUT#: 2.7 10*3/uL (ref 1.5–6.5)
NEUT%: 60.7 % (ref 38.4–76.8)
NRBC: 0 % (ref 0–0)
Platelets: 214 10*3/uL (ref 145–400)
RBC: 4.85 10*6/uL (ref 3.70–5.45)
RDW: 14.6 % — AB (ref 11.2–14.5)
RETIC %: 1.43 % (ref 0.70–2.10)
Retic Ct Abs: 69.36 10*3/uL (ref 33.70–90.70)
WBC: 4.4 10*3/uL (ref 3.9–10.3)

## 2017-06-07 MED ORDER — ABEMACICLIB 50 MG PO TABS: 100.0000 mg | ORAL_TABLET | Freq: Two times a day (BID) | ORAL | 1 refills | Status: DC

## 2017-06-07 MED ORDER — DENOSUMAB 120 MG/1.7ML ~~LOC~~ SOLN
120.0000 mg | Freq: Once | SUBCUTANEOUS | Status: AC
  Administered 2017-06-07: 120 mg via SUBCUTANEOUS
  Filled 2017-06-07: qty 1.7

## 2017-06-07 NOTE — Telephone Encounter (Signed)
Gave patient avs report and appointments for August.  °

## 2017-06-07 NOTE — Patient Instructions (Signed)
Thank you for choosing [facility] to provide your oncology and hematology care.  To afford each patient quality time with our providers, please arrive 30 minutes before your scheduled appointment time.  If you arrive late for your appointment, you may be asked to reschedule.  We strive to give you quality time with our providers, and arriving late affects you and other patients whose appointments are after yours.   If you are a no show for multiple scheduled visits, you may be dismissed from the clinic at the providers discretion.    Again, thank you for choosing [facility], our hope is that these requests will decrease the amount of time that you wait before being seen by our physicians.  ______________________________________________________________________  Should you have questions after your visit to the  Cancer Center, please contact our office at [phone] between the hours of 8:30 and 4:30 p.m.    Voicemails left after 4:30p.m will not be returned until the following business day.    For prescription refill requests, please have your pharmacy contact us directly.  Please also try to allow 48 hours for prescription requests.    Please contact the scheduling department for questions regarding scheduling.  For scheduling of procedures such as PET scans, CT scans, MRI, Ultrasound, etc please contact central scheduling at [phone].    Resources For Cancer Patients and Caregivers:   Oncolink.org:  A wonderful resource for patients and healthcare providers for information regarding your disease, ways to tract your treatment, what to expect, etc.     American Cancer Society:  800-227-2345  Can help patients locate various types of support and financial assistance  Cancer Care: 1-800-813-HOPE (4673) Provides financial assistance, online support groups, medication/co-pay assistance.    [place] DSS:  336-641-3447 Where to apply for food  stamps, Medicaid, and utility assistance  Medicare Rights Center: 800-333-4114 Helps people with Medicare understand their rights and benefits, navigate the Medicare system, and secure the quality healthcare they deserve  SCAT: 336-333-6589 [place] [employer]'s shared-ride transportation service for eligible riders who have a disability that prevents them from riding the fixed route bus.    For additional information on assistance programs please contact our social worker:   Grier Hock/Abigail Elmore:  [phone]            

## 2017-06-07 NOTE — Patient Instructions (Signed)

## 2017-06-12 NOTE — Progress Notes (Signed)
[name]    HEMATOLOGY/ONCOLOGY CLINIC NOTE  Date of Service: .06/07/2017  Patient Care Team: Patient, No Pcp Per as PCP - General (General Practice)  CHIEF COMPLAINTS/PURPOSE OF CONSULTATION:  F/u for breast cancer  HISTORY OF PRESENTING ILLNESS:   plz see previous notes for details.  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 She is on letrozole. In addition she has been taking Verzenio 5m po BID for the last few weeks and has tolerated this without any issues. No skin rash. No reappearance of exfoliative dermatitis. Her Verzenio dose was increased to 1049mpo BID and she was recommended to keep a close eye on any potential skin or other toxicities. No bone pains. No other concerning new focal symptoms.  MEDICAL HISTORY:  [facility]:  [facility]   [facility]    SURGICAL HISTORY: Past Surgical History:  Procedure Laterality Date  . CHOLECYSTECTOMY    . TONSILLECTOMY      SOCIAL HISTORY: Social History   Social History  . Marital status: Single    Spouse name: N/A  . Number of children: N/A  . Years of education: N/A   Occupational History  . Not on file.   Social History Main Topics  . Smoking status: Former Smoker    Packs/day: 0.00    Types: Cigarettes    Quit date: 01/21/2017  . Smokeless tobacco: Never Used  . Alcohol use No  . Drug use: No  . Sexual activity: No   Other Topics Concern  . Not on file   Social History Narrative  . 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 Cancer Mother        breast  . Cancer Sister        breast  Mother with h/o breast cancer in her 6032'sister with breast cancer in her 5059ysnd later was diagnosed with  multiple myeloma. (Patient is not aware of any specific breast cancer mutations present)  ALLERGIES:  is allergic to thorazine [chlorpromazine] and ribociclib.  MEDICATIONS:  Current Outpatient Prescriptions  Medication Sig Dispense Refill  . Abemaciclib (VERZENIO) 50 MG TABS Take 100 mg by mouth 2 (two) times daily. 120 tablet 1  . ergocalciferol (VITAMIN D2) 50000 units capsule Take one capsule by mouth three times weekly. 24 capsule 1  . escitalopram (LEXAPRO) 10 MG tablet TAKE 1 TABLET BY MOUTH DAILY 30 tablet 1  . letrozole (FEMARA) 2.5 MG tablet Take 1 tablet (2.5 mg total) by mouth daily. 30 tablet 6  . LORazepam (ATIVAN) 0.5 MG tablet Take 1 tablet (0.5 mg total) by mouth every 6 (six) hours as needed for anxiety. 60 tablet 0  . magic mouthwash w/lidocaine SOLN Take 5 mLs by mouth 4 (four) times daily as needed for mouth pain. Duke's formula with nystatin and lidocaine 1:1 240 mL 1  . ondansetron (ZOFRAN) 4 MG tablet Take 1 tablet (4 mg total) by mouth every 8 (eight) hours as needed for nausea or vomiting. 20 tablet 1  . urea (CARMOL) 40 % CREA Apply topically daily.     No current facility-administered medications for this visit.     REVIEW OF SYSTEMS:    10 Point review of  Systems was done is negative except as noted above.  PHYSICAL EXAMINATION: ECOG PERFORMANCE STATUS: 1 - Symptomatic but completely ambulatory  . Vitals:   06/07/17 1503  BP: (!) 130/93  Pulse: 70  Resp: 18  Temp: 98.7 F (37.1 C)   Filed Weights   06/07/17 1503  Weight: 171 lb 4.8 oz (77.7 kg)   .Body mass index is 28.51 kg/m.  GENERAL:alert, in no acute distress and comfortable SKIN: Exfoliative dermatitis involving the hands, upper extremities chest and back has significantly improved and she has some grade 1-2 exfoliation in her feet. EYES: conjunctiva are pink and non-injected, sclera anicteric OROPHARYNX: MMM, no exudates, no oropharyngeal erythema or ulceration NECK: supple, no  JVD LYMPH:  no palpable lymphadenopathy in the cervical, axillary or inguinal regions LUNGS: clear to auscultation b/l with normal respiratory effort HEART: regular rate & rhythm ABDOMEN:  normoactive bowel sounds , non tender, not distended. Extremity: no pedal edema PSYCH: alert & oriented x 3 with fluent speech NEURO: no focal motor/sensory deficits Breast examination was done recently in the hospital and not repeated today  LABORATORY DATA:  I have reviewed the data as listed  . CBC Latest Ref Rng & Units 06/07/2017 05/02/2017 04/04/2017  WBC 3.9 - 10.3 10e3/uL 4.4 6.3 10.8(H)  Hemoglobin 11.6 - 15.9 g/dL 14.2 14.5 12.6  Hematocrit 34.8 - 46.6 % 43.5 45.0 38.5  Platelets 145 - 400 10e3/uL 214 211 205    . CMP Latest Ref Rng & Units 06/07/2017 05/02/2017 04/04/2017  Glucose 70 - 140 mg/dl 148(H) 124 149(H)  BUN 7.0 - 26.0 mg/dL 10.9 10.4 10.5  Creatinine 0.6 - 1.1 mg/dL 1.0 0.8 0.7  Sodium 136 - 145 mEq/L 142 141 140  Potassium 3.5 - 5.1 mEq/L 3.6 3.8 3.8  Chloride 101 - 111 mmol/L - - -  CO2 22 - 29 mEq/L 22 23 21(L)  Calcium 8.4 - 10.4 mg/dL 9.2 8.9 8.8  Total Protein 6.4 - 8.3 g/dL 6.8 7.0 6.7  Total Bilirubin 0.20 - 1.20 mg/dL 0.38 0.47 0.40  Alkaline Phos 40 - 150 U/L 128 180(H) 261(H)  AST 5 - 34 U/L _0 ALT 0 - 55 U/L 28 40 64(H)            RADIOGRAPHIC STUDIES: I have personally reviewed the radiological images as listed and agreed with the findings in the report. No results found.  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 Multifocal tumor in the left breast with biopsy-proven left axillary metastases. Noted to have extensive bone metastases and pulmonary metastases. 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 No other new medication. Now resolved. Monitoring on increasing doses of Verzenio. No issues with recurrent rash on Verzenio 68m po BID  Plan -patients rash on recurred on Verzenio 551mpo BID -dose of Verzenio increased to 1007mo BID -she will continue daily letrozole 2.5 mg. -Continue on Xgeva every 4 weeks. -RTC with Dr KalIrene Limbo 4 weeks with labs. Earlier if  any new concerns -plan for rpt restaging CT/Bone scans in 07/2017 with rpt tumor markers (last tumor markers were down trending).  #6 vitamin D deficiency up from 14 to 21.6  RTC with Dr Irene Limbo in 4 weeks with labs  All of the patients questions were answered with apparent satisfaction. The patient knows to call the clinic with any problems, questions or concerns.  I spent 20 minutes counseling the patient face to face. The total time spent in the appointment was 25 minutes and more than 50% was on counseling and direct patient cares.    Sullivan Lone MD [place] AAHIVMS [facility] Hematology/Oncology Physician [facility]  (Office):       [phone] (Work cell):  [phone] (Fax):           [phone]

## 2017-06-13 ENCOUNTER — Telehealth: Payer: Self-pay | Admitting: Pharmacist

## 2017-06-13 DIAGNOSIS — C50919 Malignant neoplasm of unspecified site of unspecified female breast: Secondary | ICD-10-CM

## 2017-06-13 MED ORDER — ABEMACICLIB 100 MG PO TABS: 100.0000 mg | ORAL_TABLET | Freq: Two times a day (BID) | ORAL | 1 refills | Status: DC

## 2017-06-13 MED FILL — VERZENIO 100 MG TAB: 100 | 28 days supply | Qty: 56 | Fill #0

## 2017-06-13 NOTE — Telephone Encounter (Signed)
Oral Chemotherapy Pharmacist Encounter  Received new prescription for Verzenio dose increase to 100mg  tablets BID. Patient tolerated 50mg  BID with no dermatologic toxicities.  Prescription has been e-scribed to the [facility]. Coapyment $0. The pharmacy will reach out to patient for pick-up or delivery information.  Oral Oncology Clinic will continue to follow.  Johny Drilling, PharmD, BCPS, BCOP 06/13/2017  3:23 PM Oral Oncology Clinic [phone]

## 2017-06-14 NOTE — Telephone Encounter (Signed)
Oral Chemotherapy Pharmacist Encounter  Follow-Up Form  Called patient on 06/13/17 to follow up regarding patient's oral chemotherapy medication: Verzenio  Original Start date of oral chemotherapy: 05/17/17  Pt is doing well today  Pt reports 0 tablets/doses of Verzenio 50mg  tablets, 1 tablet by mouth twice daily missed in the last month. Patient will increase Verzenio to 100mg  tablets, 1 tablets by mouth twice daily in 7 more days after the completion of her current fill of the Verzenio 50mg  tablets.   Pt reports the following side effects: none  Other Issues: none to report  Patient knows to call the office with questions or concerns. Oral Oncology Clinic will continue to follow.  Thank you,  Johny Drilling, PharmD, BCPS, BCOP 06/14/2017 11:33 AM Oral Oncology Clinic [phone]

## 2017-06-21 ENCOUNTER — Other Ambulatory Visit: Payer: Self-pay

## 2017-06-21 MED ORDER — LORAZEPAM 0.5 MG PO TABS: 0.5000 mg | ORAL_TABLET | Freq: Four times a day (QID) | ORAL | 0 refills | Status: DC | PRN

## 2017-06-21 NOTE — Telephone Encounter (Signed)
Oral Chemotherapy Pharmacist Encounter  Received call from patient in [facility] with a few questions about her Verzenio.  Patient planned to start Verzenio 100mg  tablets by mouth BID today, however will be travelling to the beach today and will wait until this Saturday (06/25/17) to start increased dosage.  Patient also stated she is experiencing left wrist soreness and pain since starting Verzenio. The right wrist has started to bother her this week. Arthralgia 15% incidence with Verzenio. Patient states this possible side effect is manageable and would like to continue on Verzenio at this time. She works on a computer frequently and will increase wrist support to decrease soreness.  Pt reports 0 tablets/doses of Verzenio 50mg  tablets, 1 tablet by mouth twice daily missed in the last month. She has already picked up the fill for dose increase.  Patient knows to call the office with questions or concerns. Oral Oncology Clinic will continue to follow.  Thank you,  Johny Drilling, PharmD, BCPS, BCOP 06/21/2017  9:56 AM Oral Oncology Clinic [phone]

## 2017-06-24 ENCOUNTER — Encounter: Payer: Self-pay | Admitting: Hematology

## 2017-06-24 NOTE — Progress Notes (Signed)
Received PA approval from [facility] for Ondansetron(Zofran).  PA approved as long as she remains covered by plan 06/23/17-12/24/17.

## 2017-06-28 ENCOUNTER — Other Ambulatory Visit: Payer: Self-pay | Admitting: Hematology

## 2017-07-05 ENCOUNTER — Encounter: Payer: Self-pay | Admitting: Hematology

## 2017-07-05 ENCOUNTER — Ambulatory Visit (HOSPITAL_BASED_OUTPATIENT_CLINIC_OR_DEPARTMENT_OTHER): Payer: BC Managed Care – PPO | Admitting: Hematology

## 2017-07-05 ENCOUNTER — Ambulatory Visit (HOSPITAL_BASED_OUTPATIENT_CLINIC_OR_DEPARTMENT_OTHER): Payer: BC Managed Care – PPO

## 2017-07-05 ENCOUNTER — Other Ambulatory Visit (HOSPITAL_BASED_OUTPATIENT_CLINIC_OR_DEPARTMENT_OTHER): Payer: BC Managed Care – PPO

## 2017-07-05 ENCOUNTER — Telehealth: Payer: Self-pay | Admitting: Hematology

## 2017-07-05 VITALS — BP 112/86 | HR 60 | Temp 98.8°F | Resp 20 | Ht 65.0 in | Wt 172.2 lb

## 2017-07-05 DIAGNOSIS — E559 Vitamin D deficiency, unspecified: Secondary | ICD-10-CM

## 2017-07-05 DIAGNOSIS — C7800 Secondary malignant neoplasm of unspecified lung: Secondary | ICD-10-CM | POA: Diagnosis not present

## 2017-07-05 DIAGNOSIS — L271 Localized skin eruption due to drugs and medicaments taken internally: Secondary | ICD-10-CM

## 2017-07-05 DIAGNOSIS — C50919 Malignant neoplasm of unspecified site of unspecified female breast: Secondary | ICD-10-CM | POA: Diagnosis not present

## 2017-07-05 DIAGNOSIS — Z170 Estrogen receptor positive status [ER+]: Secondary | ICD-10-CM

## 2017-07-05 DIAGNOSIS — Z7189 Other specified counseling: Secondary | ICD-10-CM

## 2017-07-05 DIAGNOSIS — C7951 Secondary malignant neoplasm of bone: Secondary | ICD-10-CM

## 2017-07-05 DIAGNOSIS — C773 Secondary and unspecified malignant neoplasm of axilla and upper limb lymph nodes: Secondary | ICD-10-CM

## 2017-07-05 DIAGNOSIS — C50812 Malignant neoplasm of overlapping sites of left female breast: Secondary | ICD-10-CM | POA: Diagnosis not present

## 2017-07-05 DIAGNOSIS — G893 Neoplasm related pain (acute) (chronic): Secondary | ICD-10-CM | POA: Diagnosis not present

## 2017-07-05 LAB — CBC & DIFF AND RETIC
BASO%: 1 % (ref 0.0–2.0)
BASOS ABS: 0.1 10*3/uL (ref 0.0–0.1)
EOS ABS: 0.1 10*3/uL (ref 0.0–0.5)
EOS%: 1.2 % (ref 0.0–7.0)
HEMATOCRIT: 42.2 % (ref 34.8–46.6)
HGB: 13.8 g/dL (ref 11.6–15.9)
IMMATURE RETIC FRACT: 3.8 % (ref 1.60–10.00)
LYMPH%: 24.3 % (ref 14.0–49.7)
MCH: 29.5 pg (ref 25.1–34.0)
MCHC: 32.7 g/dL (ref 31.5–36.0)
MCV: 90.2 fL (ref 79.5–101.0)
MONO#: 0.3 10*3/uL (ref 0.1–0.9)
MONO%: 6.9 % (ref 0.0–14.0)
NEUT#: 3.2 10*3/uL (ref 1.5–6.5)
NEUT%: 66.6 % (ref 38.4–76.8)
PLATELETS: 198 10*3/uL (ref 145–400)
RBC: 4.68 10*6/uL (ref 3.70–5.45)
RDW: 14 % (ref 11.2–14.5)
RETIC %: 0.73 % (ref 0.70–2.10)
RETIC CT ABS: 34.16 10*3/uL (ref 33.70–90.70)
WBC: 4.8 10*3/uL (ref 3.9–10.3)
lymph#: 1.2 10*3/uL (ref 0.9–3.3)

## 2017-07-05 LAB — COMPREHENSIVE METABOLIC PANEL
ALT: 24 U/L (ref 0–55)
ANION GAP: 10 meq/L (ref 3–11)
AST: 18 U/L (ref 5–34)
Albumin: 3.6 g/dL (ref 3.5–5.0)
Alkaline Phosphatase: 107 U/L (ref 40–150)
BILIRUBIN TOTAL: 0.36 mg/dL (ref 0.20–1.20)
BUN: 12.6 mg/dL (ref 7.0–26.0)
CHLORIDE: 109 meq/L (ref 98–109)
CO2: 23 meq/L (ref 22–29)
CREATININE: 1.2 mg/dL — AB (ref 0.6–1.1)
Calcium: 9.8 mg/dL (ref 8.4–10.4)
EGFR: 54 mL/min/{1.73_m2} — ABNORMAL LOW (ref >90)
GLUCOSE: 122 mg/dL (ref 70–140)
Potassium: 3.8 mEq/L (ref 3.5–5.1)
Sodium: 142 mEq/L (ref 136–145)
TOTAL PROTEIN: 6.8 g/dL (ref 6.4–8.3)

## 2017-07-05 MED ORDER — ABEMACICLIB 150 MG PO TABS
150.0000 mg | ORAL_TABLET | Freq: Two times a day (BID) | ORAL | 0 refills | Status: DC
Start: 2017-07-05 — End: 2017-08-01

## 2017-07-05 MED ORDER — DENOSUMAB 120 MG/1.7ML ~~LOC~~ SOLN
120.0000 mg | Freq: Once | SUBCUTANEOUS | Status: AC
  Administered 2017-07-05: 120 mg via SUBCUTANEOUS
  Filled 2017-07-05: qty 1.7

## 2017-07-05 MED FILL — VERZENIO 150 MG TAB: 150 | 28 days supply | Qty: 56 | Fill #0

## 2017-07-05 NOTE — Patient Instructions (Signed)

## 2017-07-05 NOTE — Telephone Encounter (Signed)
Spoke with patients regarding upcoming appts. Did not want avs or calendar.

## 2017-07-05 NOTE — Patient Instructions (Signed)
Thank you for choosing [facility] to provide your oncology and hematology care.  To afford each patient quality time with our providers, please arrive 30 minutes before your scheduled appointment time.  If you arrive late for your appointment, you may be asked to reschedule.  We strive to give you quality time with our providers, and arriving late affects you and other patients whose appointments are after yours.   If you are a no show for multiple scheduled visits, you may be dismissed from the clinic at the providers discretion.    Again, thank you for choosing [facility], our hope is that these requests will decrease the amount of time that you wait before being seen by our physicians.  ______________________________________________________________________  Should you have questions after your visit to the [facility], please contact our office at [phone] between the hours of 8:30 and 4:30 p.m.    Voicemails left after 4:30p.m will not be returned until the following business day.    For prescription refill requests, please have your pharmacy contact us directly.  Please also try to allow 48 hours for prescription requests.    Please contact the scheduling department for questions regarding scheduling.  For scheduling of procedures such as PET scans, CT scans, MRI, Ultrasound, etc please contact central scheduling at [phone].    Resources For Cancer Patients and Caregivers:   Oncolink.org:  A wonderful resource for patients and healthcare providers for information regarding your disease, ways to tract your treatment, what to expect, etc.     American Cancer Society:  800-227-2345  Can help patients locate various types of support and financial assistance  Cancer Care: 1-800-813-HOPE (4673) Provides financial assistance, online support groups, medication/co-pay assistance.    [place] DSS:  336-641-3447 Where to apply for food  stamps, Medicaid, and utility assistance  Medicare Rights Center: 800-333-4114 Helps people with Medicare understand their rights and benefits, navigate the Medicare system, and secure the quality healthcare they deserve  SCAT: 336-333-6589 [place] [employer]'s shared-ride transportation service for eligible riders who have a disability that prevents them from riding the fixed route bus.    For additional information on assistance programs please contact our social worker:   Grier Hock/Abigail Elmore:  [phone]            

## 2017-07-06 LAB — VITAMIN D 25 HYDROXY (VIT D DEFICIENCY, FRACTURES): Vitamin D, 25-Hydroxy: 22.6 ng/mL — ABNORMAL LOW (ref 30.0–100.0)

## 2017-07-09 NOTE — Progress Notes (Signed)
Stacey Cox    HEMATOLOGY/ONCOLOGY CLINIC NOTE  Date of Service: .07/05/2017  Patient Care Team: Patient, No Pcp Per as PCP - General (General Practice)  CHIEF COMPLAINTS/PURPOSE OF CONSULTATION:  F/u for breast cancer  HISTORY OF PRESENTING ILLNESS:   plz see previous notes for details.  INTERVAL HISTORY  Stacey Cox is here for follow-up of her metastatic hormone positive HER-2 negative breast cancer and re-evaluation of her drug reaction. She is on letrozole. In addition she has been taking Verzenio 165m po BID for the last 2 weeks and has tolerated this without any issues. No skin rash. No reappearance of exfoliative dermatitis. Her Verzenio dose was increased to 1556mpo BID starting in 2 weeksand she was recommended to keep a close eye on any potential skin or other toxicities. No bone pains. No other concerning new focal symptoms.  MEDICAL HISTORY:  Past Medical History:  [facility]osis Date  . [name]   Metastatic Breast Cancer    SURGICAL HISTORY: Past Surgical History:  Procedure Laterality Date  . CHOLECYSTECTOMY    . TONSILLECTOMY      SOCIAL HISTORY: Social History   Social History  . Marital status: Single    Spouse name: N/A  . Number of children: N/A  . Years of education: N/A   Occupational History  . Not on file.   Social History Main Topics  . Smoking status: Former Smoker    Packs/day: 0.00    Types: Cigarettes    Quit date: 01/21/2017  . Smokeless tobacco: Never Used  . Alcohol use No  . Drug use: No  . Sexual activity: No   Other Topics Concern  . Not on file   Social History Narrative  . 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 Cancer Mother        breast  . Cancer Sister        breast  Mother with h/o breast cancer in her 6055'sister with breast cancer in her 5077ysnd later  was diagnosed with multiple myeloma. (Patient is not aware of any specific breast cancer mutations present)  ALLERGIES:  is allergic to thorazine [chlorpromazine] and ribociclib.  MEDICATIONS:  Current Outpatient Prescriptions  Medication Sig Dispense Refill  . ergocalciferol (VITAMIN D2) 50000 units capsule Take one capsule by mouth three times weekly. 24 capsule 1  . escitalopram (LEXAPRO) 10 MG tablet TAKE 1 TABLET BY MOUTH DAILY 30 tablet 0  . letrozole (FEMARA) 2.5 MG tablet Take 1 tablet (2.5 mg total) by mouth daily. 30 tablet 6  . LORazepam (ATIVAN) 0.5 MG tablet Take 1 tablet (0.5 mg total) by mouth every 6 (six) hours as needed for anxiety. 60 tablet 0  . Abemaciclib (VERZENIO) 150 MG TABS Take 150 mg by mouth 2 (two) times daily. 60 tablet 0  . ondansetron (ZOFRAN) 4 MG tablet Take 1 tablet (4 mg total) by mouth every 8 (eight) hours as needed for nausea or vomiting. (Patient not taking: Reported on 07/05/2017) 20 tablet 1   No current facility-administered medications for this visit.     REVIEW OF SYSTEMS:    10 Point review of Systems was done is negative except as noted above.  PHYSICAL EXAMINATION: ECOG PERFORMANCE STATUS: 1 - Symptomatic but completely ambulatory  . Vitals:   07/05/17 1403  BP: 112/86  Pulse:  60  Resp: 20  Temp: 98.8 F (37.1 C)  SpO2: 100%   Filed Weights   07/05/17 1403  Weight: 172 lb 3.2 oz (78.1 kg)   .Body mass index is 28.66 kg/m.  GENERAL:alert, in no acute distress and comfortable SKIN: Exfoliative dermatitis involving the hands, upper extremities chest and back has significantly improved and she has some grade 1-2 exfoliation in her feet. EYES: conjunctiva are pink and non-injected, sclera anicteric OROPHARYNX: MMM, no exudates, no oropharyngeal erythema or ulceration NECK: supple, no JVD LYMPH:  no palpable lymphadenopathy in the cervical, axillary or inguinal regions LUNGS: clear to auscultation b/l with normal respiratory  effort HEART: regular rate & rhythm ABDOMEN:  normoactive bowel sounds , non tender, not distended. Extremity: no pedal edema PSYCH: alert & oriented x 3 with fluent speech NEURO: no focal motor/sensory deficits Breast examination was done recently in the hospital and not repeated today  LABORATORY DATA:  I have reviewed the data as listed  . CBC Latest Ref Rng & Units 07/05/2017 06/07/2017 05/02/2017  WBC 3.9 - 10.3 10e3/uL 4.8 4.4 6.3  Hemoglobin 11.6 - 15.9 g/dL 13.8 14.2 14.5  Hematocrit 34.8 - 46.6 % 42.2 43.5 45.0  Platelets 145 - 400 10e3/uL 198 214 211    . CMP Latest Ref Rng & Units 07/05/2017 06/07/2017 05/02/2017  Glucose 70 - 140 mg/dl 122 148(H) 124  BUN 7.0 - 26.0 mg/dL 12.6 10.9 10.4  Creatinine 0.6 - 1.1 mg/dL 1.2(H) 1.0 0.8  Sodium 136 - 145 mEq/L 142 142 141  Potassium 3.5 - 5.1 mEq/L 3.8 3.6 3.8  Chloride 101 - 111 mmol/L - - -  CO2 22 - 29 mEq/L _0 Calcium 8.4 - 10.4 mg/dL 9.8 9.2 8.9  Total Protein 6.4 - 8.3 g/dL 6.8 6.8 7.0  Total Bilirubin 0.20 - 1.20 mg/dL 0.36 0.38 0.47  Alkaline Phos 40 - 150 U/L 107 128 180(H)  AST 5 - 34 U/L _1 ALT 0 - 55 U/L 24 28 40            RADIOGRAPHIC STUDIES: I have personally reviewed the radiological images as listed and agreed with the findings in the report. No results found.  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 Multifocal tumor in the left breast with biopsy-proven left axillary metastases. Noted to have extensive bone metastases and pulmonary metastases. 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 No other new medication. Now resolved. Monitoring on increasing doses of Verzenio. No issues with recurrent rash on Verzenio 168m po BID  Plan -labs stable -patients rash has not recurred on Verzenio 1064mpo BID -dose of Verzenio increased to 15093mo BID which she wants to start in 2 weeks. -she will continue daily letrozole 2.5 mg. -Continue on Xgeva every 4 weeks. -RTC with Dr KalIrene Limbo 4 weeks with labs. Earlier if any new concerns -plan for rpt restaging CT/Bone scans in later part of 07/2017 with rpt tumor markers (last  tumor markers were down trending).  #6 vitamin D deficiency up from 14 to 21.6 to 22.6  RTC with Dr Irene Limbo in 4 weeks with labs  All of the patients questions were answered with apparent satisfaction. The patient knows to call the clinic with any problems, questions or concerns.  I spent 20 minutes counseling the patient face to face. The total time spent in the appointment was 25 minutes and more than 50% was on counseling and direct patient cares.    Sullivan Lone MD [place] AAHIVMS [facility] Hematology/Oncology Physician [facility]  (Office):       [phone] (Work cell):  [phone] (Fax):           [phone]

## 2017-07-11 ENCOUNTER — Telehealth: Payer: Self-pay | Admitting: *Deleted

## 2017-07-11 NOTE — Telephone Encounter (Signed)
Per staff message, informed pt to continue taking Ergocalciferol three times weekly due to low Vit D level.  Pt verbalized understanding.

## 2017-07-11 NOTE — Telephone Encounter (Signed)
-----   Message from [name], MD sent at 07/09/2017 11:28 PM EDT ----- Plz let patient know VitD levels still 22.6 -- would continue the Ergocalciferol three days a week currently. Thanks [place]

## 2017-07-19 ENCOUNTER — Telehealth: Payer: Self-pay

## 2017-07-19 MED ORDER — ONDANSETRON HCL 4 MG PO TABS: 4.0000 mg | ORAL_TABLET | Freq: Three times a day (TID) | ORAL | 1 refills | Status: DC | PRN

## 2017-07-19 NOTE — Telephone Encounter (Signed)
Pt called for zofran refill and [facility]. Instructed her there should be refills on [facility].  Zofran e-scribed.

## 2017-07-31 NOTE — Progress Notes (Signed)
[name]    HEMATOLOGY/ONCOLOGY CLINIC NOTE  Date of Service: 08/01/17  Patient Care Team: Patient, No Pcp Per as PCP - General (General Practice)  CHIEF COMPLAINTS/PURPOSE OF CONSULTATION:  F/u for breast cancer HISTORY OF PRESENTING ILLNESS:   plz see previous notes for details.  INTERVAL HISTORY  Stacey Cox is here for follow-up of her metastatic hormone positive HER-2 negative breast cancer and re-evaluation of her drug reaction. She is on letrozole. In addition she has been taking Verzenio 123m po BID for the last 2 weeks and has tolerated this without any prohibitive toxicities. Notes some grade 1 diarrhea controlled with when necessary Imodium. No recurrence of her desquamative rash.  On review of systems, pt denies fever, chills, weight loss, decreased appetite, decreased energy levels. Denies pain. Pt denies abdominal pain, nausea, vomiting.  No bone pains. No other concerning new focal symptoms.. [place] . EKG done today shows no Qtc prolongation .  MEDICAL HISTORY:  Past Medical History:  Diagnosis Date  . Cancer ([facility]    Metastatic Breast Cancer    SURGICAL HISTORY: Past Surgical History:  Procedure Laterality Date  . CHOLECYSTECTOMY    . TONSILLECTOMY      SOCIAL HISTORY: Social History   Social History  . Marital status: Single    Spouse name: N/A  . Number of children: N/A  . Years of education: N/A   Occupational History  . Not on file.   Social History Main Topics  . Smoking status: Former Smoker    Packs/day: 0.00    Types: Cigarettes    Quit date: 01/21/2017  . Smokeless tobacco: Never Used  . Alcohol use No  . Drug use: No  . Sexual activity: No   Other Topics Concern  . Not on file   Social History Narrative  . 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 Cancer Mother        breast  . Cancer Sister        breast  Mother with h/o breast cancer in her 636'sSister with breast cancer in her 557ysand later was diagnosed with multiple myeloma. (Patient is not aware of any specific breast cancer mutations present)  ALLERGIES:  is allergic to thorazine [chlorpromazine] and ribociclib.  MEDICATIONS:  Current Outpatient Prescriptions  Medication Sig Dispense Refill  . aspirin EC 81 MG tablet Take 1 tablet (81 mg total) by mouth daily.    . ergocalciferol (VITAMIN D2) 50000 units capsule Take one capsule by mouth three times weekly. 24 capsule 1  . escitalopram (LEXAPRO) 10 MG tablet TAKE 1 TABLET BY MOUTH DAILY 30 tablet 0  . letrozole (FEMARA) 2.5 MG tablet Take 1 tablet (2.5 mg total) by mouth daily. 30 tablet 6  . LORazepam (ATIVAN) 0.5 MG tablet Take 1 tablet (0.5 mg total) by mouth every 6 (six) hours as needed for anxiety. 60 tablet 0  . ondansetron (ZOFRAN) 4 MG tablet Take 1 tablet (4 mg total) by mouth every 8 (eight) hours as needed for nausea or vomiting. 20 tablet 1  . VERZENIO 150 MG TABS TAKE 150 MG BY MOUTH 2 (TWO) TIMES DAILY. 60 tablet 0   No current facility-administered medications for this visit.  REVIEW OF SYSTEMS:    10 Point review of Systems was done is negative except as noted above.  PHYSICAL EXAMINATION:  ECOG PERFORMANCE STATUS: 1 - Symptomatic but completely ambulatory  . Vitals:   08/01/17 1230  BP: 111/77  Pulse: 85  Resp: 20  Temp: 98.6 F (37 C)  SpO2: 96%   Filed Weights   08/01/17 1230  Weight: 171 lb 8 oz (77.8 kg)   .Body mass index is 28.54 kg/m.  GENERAL:alert, in no acute distress and comfortable SKIN: completely resolved exfoliative dermatitis. No recurrence on Vernezio EYES: conjunctiva are pink and non-injected, sclera anicteric OROPHARYNX: MMM, no exudates, no oropharyngeal erythema or  ulceration NECK: supple, no JVD LYMPH:  no palpable lymphadenopathy in the cervical, axillary or inguinal regions LUNGS: clear to auscultation b/l with normal respiratory effort HEART: regular rate & rhythm ABDOMEN:  normoactive bowel sounds , non tender, not distended. Extremity: no pedal edema PSYCH: alert & oriented x 3 with fluent speech NEURO: no focal motor/sensory deficits Breast examination not done today. Patient reports no breast pain or tenderness and no ulcerated areas.  LABORATORY DATA:  I have reviewed the data as listed  . CBC Latest Ref Rng & Units 08/01/2017 07/05/2017 06/07/2017  WBC 3.9 - 10.3 10e3/uL 5.0 4.8 4.4  Hemoglobin 11.6 - 15.9 g/dL 14.6 13.8 14.2  Hematocrit 34.8 - 46.6 % 43.8 42.2 43.5  Platelets 145 - 400 10e3/uL 227 198 214    . CMP Latest Ref Rng & Units 08/01/2017 07/05/2017 06/07/2017  Glucose 70 - 140 mg/dl 94 122 148(H)  BUN 7.0 - 26.0 mg/dL 13.1 12.6 10.9  Creatinine 0.6 - 1.1 mg/dL 1.4(H) 1.2(H) 1.0  Sodium 136 - 145 mEq/L 139 142 142  Potassium 3.5 - 5.1 mEq/L 3.9 3.8 3.6  Chloride 101 - 111 mmol/L - - -  CO2 22 - 29 mEq/L _0 Calcium 8.4 - 10.4 mg/dL 10.0 9.8 9.2  Total Protein 6.4 - 8.3 g/dL 7.6 6.8 6.8  Total Bilirubin 0.20 - 1.20 mg/dL 0.34 0.36 0.38  Alkaline Phos 40 - 150 U/L 100 107 128  AST 5 - 34 U/L _1 ALT 0 - 55 U/L 33 24 28   Component     Latest Ref Rng & Units 05/02/2017 08/01/2017  CA 27.29     0.0 - 38.6 U/mL 16.3 16.4    Component     Latest Ref Rng & Units 01/21/2017 05/02/2017 08/01/2017  CA 15-3     0.0 - 25.0 U/mL 32.4 (H) 14.7 12.5          RADIOGRAPHIC STUDIES: I have personally reviewed the radiological images as listed and agreed with the findings in the report. No results found.  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 Multifocal tumor in the left breast with  biopsy-proven left axillary metastases. Noted to have extensive bone metastases and pulmonary metastases. Patient is noted to have calvarial metastases but no overt parenchymal metastasis She has been noted to have good clinical response and her tumor marker levels of progressively normalized.  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 Much improved back pain.  # 3 Neutropenia Related to her Ribociclib - resolved. Patient is currently on Verzenio and has not developed any neutropenia. This is being monitored.  #4 Increased ALT about 2X ULN - likely from Ribociclib - now resolved.  Monitoring with Verzenio.  #5 Cancer related pain - controlled - status post palliative RT to spine - no currently needing any pain medications.  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 No other new medication. Now resolved. Monitoring on increasing doses of Verzenio. No issues with recurrent rash on Verzenio 170m po BID  #7 Grade 1 diarrhea related to her Verzenio. She notes it is controlled with Imodium when necessary. We discussed that we would need to hold the medication noted in the dose of the diarrhea worsens. Patient is hesitant to continue on the dose of this point and notes that she will monitor it.  #8 slight increase in creatinine to 1.4. Likely from the [facility] Monitor. Patient recommended to stay well-hydrated. Plan -labs stable -EKG showed no kidney prolongation -patients rash has not recurred on Verzenio 1520mpo BID and she will continue the same.. -Grade 1 diarrhea related to her Verzenio. She notes it is controlled with Imodium when necessary. We discussed that we would need to hold the medication noted in the dose of the diarrhea worsens. Patient is hesitant to continue on the dose of this point and notes that she will monitor it. -she will continue daily letrozole 2.5 mg. -Continue on Xgeva every 4 weeks. -Outpatient cancer rehabilitation referral given  for strengthening endurance training and management of fatigue. -CT chest/abd/pelvis in 6 weeks - without contrast given slight increase in her creatinine.    EKG today Cancer rehab referral CT chest /abd/pelvis in 6 weeks  Continue Xgeva q4 weeks RTC with Dr KaIrene Limboith labs and CT chest/abd/pelvis in 6 weeks  #6 vitamin D deficiency up from 14 to 21.6 to 22.6 as of 07/05/2017. -Recheck levels in follow-up in 6 weeks Continue ergocalciferol at current dose   All of the patients questions were answered with apparent satisfaction. The patient knows to call the clinic with any problems, questions or concerns.  I spent 25 minutes counseling the patient face to face. The total time spent in the appointment was 40 minutes and more than 50% was on counseling and direct patient cares.    GaSullivan LoneD MSOmroAHIVMS SCEssentia [facility] St [facility] MedTMusc Health Lancaster Medical Centerematology/Oncology Physician CoDesert View Regional [facility] Center(Office):       [phone] cell):  [phone]):           [phone] document serves as a record of services personally performed by GaSullivan LoneMD. It was created on her behalf by SoSteva Coldera trained medical scribe. The creation of this record is based on the scribe's personal observations and the provider's statements to them. This document has been checked and approved by the attending provider.

## 2017-08-01 ENCOUNTER — Ambulatory Visit (HOSPITAL_BASED_OUTPATIENT_CLINIC_OR_DEPARTMENT_OTHER): Payer: BC Managed Care – PPO | Admitting: Hematology

## 2017-08-01 ENCOUNTER — Other Ambulatory Visit: Payer: Self-pay

## 2017-08-01 ENCOUNTER — Ambulatory Visit (HOSPITAL_BASED_OUTPATIENT_CLINIC_OR_DEPARTMENT_OTHER): Payer: BC Managed Care – PPO

## 2017-08-01 ENCOUNTER — Telehealth: Payer: Self-pay | Admitting: Hematology

## 2017-08-01 ENCOUNTER — Other Ambulatory Visit: Payer: Self-pay | Admitting: Hematology

## 2017-08-01 ENCOUNTER — Other Ambulatory Visit (HOSPITAL_BASED_OUTPATIENT_CLINIC_OR_DEPARTMENT_OTHER): Payer: BC Managed Care – PPO

## 2017-08-01 ENCOUNTER — Telehealth: Payer: Self-pay

## 2017-08-01 VITALS — BP 111/77 | HR 85 | Temp 98.6°F | Resp 20 | Ht 65.0 in | Wt 171.5 lb

## 2017-08-01 DIAGNOSIS — C50919 Malignant neoplasm of unspecified site of unspecified female breast: Secondary | ICD-10-CM

## 2017-08-01 DIAGNOSIS — C50812 Malignant neoplasm of overlapping sites of left female breast: Secondary | ICD-10-CM

## 2017-08-01 DIAGNOSIS — C7800 Secondary malignant neoplasm of unspecified lung: Secondary | ICD-10-CM | POA: Diagnosis not present

## 2017-08-01 DIAGNOSIS — G893 Neoplasm related pain (acute) (chronic): Secondary | ICD-10-CM

## 2017-08-01 DIAGNOSIS — D701 Agranulocytosis secondary to cancer chemotherapy: Secondary | ICD-10-CM | POA: Diagnosis not present

## 2017-08-01 DIAGNOSIS — E559 Vitamin D deficiency, unspecified: Secondary | ICD-10-CM

## 2017-08-01 DIAGNOSIS — Z7189 Other specified counseling: Secondary | ICD-10-CM

## 2017-08-01 DIAGNOSIS — C7951 Secondary malignant neoplasm of bone: Secondary | ICD-10-CM

## 2017-08-01 LAB — CBC & DIFF AND RETIC
BASO%: 1 % (ref 0.0–2.0)
BASOS ABS: 0.1 10*3/uL (ref 0.0–0.1)
EOS ABS: 0.1 10*3/uL (ref 0.0–0.5)
EOS%: 1.4 % (ref 0.0–7.0)
HEMATOCRIT: 43.8 % (ref 34.8–46.6)
HEMOGLOBIN: 14.6 g/dL (ref 11.6–15.9)
Immature Retic Fract: 3.1 % (ref 1.60–10.00)
LYMPH%: 38.2 % (ref 14.0–49.7)
MCH: 29.4 pg (ref 25.1–34.0)
MCHC: 33.3 g/dL (ref 31.5–36.0)
MCV: 88.3 fL (ref 79.5–101.0)
MONO#: 0.4 10*3/uL (ref 0.1–0.9)
MONO%: 7.8 % (ref 0.0–14.0)
NEUT%: 51.6 % (ref 38.4–76.8)
NEUTROS ABS: 2.6 10*3/uL (ref 1.5–6.5)
Platelets: 227 10*3/uL (ref 145–400)
RBC: 4.96 10*6/uL (ref 3.70–5.45)
RDW: 15.3 % — AB (ref 11.2–14.5)
RETIC %: 0.92 % (ref 0.70–2.10)
Retic Ct Abs: 45.63 10*3/uL (ref 33.70–90.70)
WBC: 5 10*3/uL (ref 3.9–10.3)
lymph#: 1.9 10*3/uL (ref 0.9–3.3)

## 2017-08-01 LAB — COMPREHENSIVE METABOLIC PANEL
ALBUMIN: 4 g/dL (ref 3.5–5.0)
ALK PHOS: 100 U/L (ref 40–150)
ALT: 33 U/L (ref 0–55)
AST: 23 U/L (ref 5–34)
Anion Gap: 11 mEq/L (ref 3–11)
BUN: 13.1 mg/dL (ref 7.0–26.0)
CALCIUM: 10 mg/dL (ref 8.4–10.4)
CHLORIDE: 105 meq/L (ref 98–109)
CO2: 23 mEq/L (ref 22–29)
Creatinine: 1.4 mg/dL — ABNORMAL HIGH (ref 0.6–1.1)
EGFR: 49 mL/min/{1.73_m2} — AB (ref >90)
Glucose: 94 mg/dl (ref 70–140)
POTASSIUM: 3.9 meq/L (ref 3.5–5.1)
Sodium: 139 mEq/L (ref 136–145)
Total Bilirubin: 0.34 mg/dL (ref 0.20–1.20)
Total Protein: 7.6 g/dL (ref 6.4–8.3)

## 2017-08-01 MED ORDER — ASPIRIN EC 81 MG PO TBEC: 81.0000 mg | DELAYED_RELEASE_TABLET | Freq: Every day | ORAL | Status: DC

## 2017-08-01 MED ORDER — DENOSUMAB 120 MG/1.7ML ~~LOC~~ SOLN
120.0000 mg | Freq: Once | SUBCUTANEOUS | Status: AC
  Administered 2017-08-01: 120 mg via SUBCUTANEOUS
  Filled 2017-08-01: qty 1.7

## 2017-08-01 MED FILL — VERZENIO 150 MG TAB: 150 | 28 days supply | Qty: 56 | Fill #0

## 2017-08-01 NOTE — Telephone Encounter (Signed)
Okay to use 9/14 labs per Dr. Irene Limbo. Kim to let phlebotomist know for pt appt today.

## 2017-08-01 NOTE — Telephone Encounter (Signed)
Gave patient avs report and appointments for October. Per [facility] ok to schedule inj in 5 weeks per patient request. Central radiology will call re scan and rehab will call re referral.

## 2017-08-01 NOTE — Patient Instructions (Signed)

## 2017-08-02 LAB — CANCER ANTIGEN 15-3: CA 15-3: 12.5 U/mL (ref 0.0–25.0)

## 2017-08-02 LAB — CANCER ANTIGEN 27.29: CA 27.29: 16.4 U/mL (ref 0.0–38.6)

## 2017-08-03 ENCOUNTER — Ambulatory Visit: Payer: BC Managed Care – PPO | Admitting: Physical Therapy

## 2017-08-03 ENCOUNTER — Ambulatory Visit: Payer: BC Managed Care – PPO | Attending: Hematology | Admitting: Physical Therapy

## 2017-08-03 DIAGNOSIS — R2689 Other abnormalities of gait and mobility: Secondary | ICD-10-CM | POA: Diagnosis not present

## 2017-08-03 DIAGNOSIS — M545 Low back pain, unspecified: Secondary | ICD-10-CM

## 2017-08-03 DIAGNOSIS — R29898 Other symptoms and signs involving the musculoskeletal system: Secondary | ICD-10-CM | POA: Insufficient documentation

## 2017-08-03 NOTE — Therapy (Addendum)
[place], [place], [address] Phone: [phone]   Fax:  [phone]  Physical Therapy Evaluation  Patient Details  Name: Stacey Cox MRN: [id number] Date of Birth: February 25, 1956 Referring Provider: Dr. Sullivan Lone  Encounter Date: 08/03/2017      PT End of Session - 08/03/17 1224    Visit Number 1   Number of Visits 9   Date for PT Re-Evaluation 09/09/17   PT Start Time 1109   PT Stop Time 1152   PT Time Calculation (min) 43 min   Activity Tolerance Patient tolerated treatment well   Behavior During Therapy [facility] for tasks assessed/performed      Past Medical History:  Diagnosis Date  . Cancer [facility])    Metastatic Breast Cancer    Past Surgical History:  Procedure Laterality Date  . CHOLECYSTECTOMY    . TONSILLECTOMY      There were no vitals filed for this visit.       Subjective Assessment - 08/03/17 1113    Subjective Dr. Irene Limbo told me that I should come, that he's never had anyone say that it wasn't a good think and honestly, I don't know what to expect.   Pertinent History Back pain led to diagnosis of left breast cancer 01/20/17. 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 Multifocal tumor in the left breast with biopsy-proven left axillary metastases and metastases in bones and lung.  Being treated with two medications.  Had radiation to her low back, 10 treatments, back in March, which helped with the pain. No other health issues.   Patient Stated Goals She is not sure what to expect.   Currently in Pain? Yes   Pain Score 2    Pain Location Back   Pain Orientation Lower   Pain Descriptors / Indicators Sore  "It's just there."   Aggravating Factors  nothing   Pain Relieving Factors hasn't tried anything   Effect of Pain on Daily Activities not necessarily due to pain, but she has difficulty sleeping, so mornings are difficult because she's tired             [facility] PT Assessment - 08/03/17 0001      Assessment   Medical Diagnosis left breast cancer with metastases   Referring Provider Dr. Sullivan Lone   Onset Date/Surgical Date 01/20/17   Hand Dominance Right   Prior Therapy none     Precautions   Precautions Other (comment)   Precaution Comments active cancer; bony and other metastases     Restrictions   Weight Bearing Restrictions No     Balance Screen   Has the patient fallen in the past 6 months No   Has the patient had a decrease in activity level because of a fear of falling?  No   Is the patient reluctant to leave their home because of a fear of falling?  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 for age; bothered her bad knees a bit     ROM / Strength   AROM / PROM / Strength AROM     AROM   Overall AROM Comments neck, arms and legs grossly WFL; back not fully tested, but rotation [facility]     Strength   Overall Strength Comments manual muscle test not done due to bony mets     Ambulation/Gait   Ambulation/Gait Yes   Ambulation/Gait Assistance 7: Independent   Stairs --  says she gets a little SOB on stairs     6 Minute Walk- Baseline   6 Minute Walk- Baseline yes   HR (bpm) 81   02 Sat (%RA) 98 %     6 Minute walk- Post Test   6 Minute Walk Post Test  yes   HR (bpm) 92   02 Sat (%RA) 98 %   Modified Borg Scale for Dyspnea 0.5- Very, very slight shortness of breath   Perceived Rate of Exertion (Borg) 7- Very, very light     6 minute walk test results    Aerobic Endurance Distance Walked 1250              Katina Dung - 08/03/17 0001    Open a tight or new jar Mild difficulty   Do heavy household chores (wash walls, wash floors) Mild difficulty   Carry a shopping bag or briefcase Mild difficulty   Wash your back Moderate difficulty   Use a knife to cut food No difficulty   During the past week, to what extent has your arm, shoulder or hand problem interfered with your normal social activities with family, friends, neighbors, or groups? Not at all   During the past week, to what extent has your arm, shoulder or hand problem limited your work or other regular daily activities Not at all   Arm, shoulder, or hand pain. Moderate   Tingling (pins and needles) in your arm, shoulder, or hand Moderate   Difficulty Sleeping Mild difficulty   DASH Score 20.45 %      Objective measurements completed on examination: See above findings.                  PT Education - 08/03/17 1228    Education provided Yes   Education Details briefly introduced community exercise options such as [employer] at the [employer], [employer] program at [employer], [employer], [employer] program at [employer].   Person(s) Educated Patient   Methods Explanation   Comprehension Verbalized understanding;Need further instruction                [facility] Goals - 08/03/17 1238      CC Long Term Goal  #1   Title Pt. will be able to perform a home exercise program for endurance and strengthening that does not aggravate low back pain.   Time 4   Period Weeks   Status New   Target Date 09/09/17     CC Long Term Goal  #2   Title Pt. will be knowledgeable about how to progress independently with her exercise program.   Time 4   Period Weeks   Status  New   Target Date 09/09/17     CC  Long Term Goal  #3   Title Pt. will be knowledgeable about community exercise program options or how to continue to exercise on her own after discharge.   Time 4   Period Weeks   Status New   Target Date 09/09/17     CC Long Term Goal  #4   Title Pt. will increase 30 second sit to stand to 15 reps or more.   Baseline 12 reps at eval   Time 4   Period Weeks   Status New   Target Date 09/09/17     CC Long Term Goal  #5   Title Reduce quick DASH score to 10 or less.   Baseline 20.45 at eval   Time 4   Period Weeks   Status New             Plan - 08/03/17 1225    Clinical Impression Statement This is a pleasant woman who was diagnosed with metastatic breast cancer when being worked up for low back pain about six months ago.  She received 10 radiation treatments and her back pain is now minimal; she is on two cancer drugs.  She is functioning fairly well with mobility, though she reports some SOB with climbing stairs.  She scored about average for her age on 81 second sit to stand, and walked 1250 feet in 6 minute walk test. She reports she does not exercise and has trouble motivating herself for this, so would benefit from guidance and encouragement in order to get the benefits of exercise.  We discussed having her start a program and in therapy and then to progress to a community exercise program.    History and Personal Factors relevant to plan of care: metastatic disease including bony and lung mets   Clinical Presentation Evolving   Clinical Presentation due to: metastatic disease   Clinical Decision Making Low   Rehab Potential Good   Clinical Impairments Affecting Rehab Potential metastases to bone and lungs; knee pain aggravated by certain activities   PT Frequency 2x / week   PT Duration 4 weeks   PT Treatment/Interventions ADLs/Self Care Home Management;Aquatic Therapy;Psychologist, prison and probation services;Therapeutic exercise;Patient/family education;Manual techniques   PT Next Visit Plan Begin exercise program and HEP instruction with active exercises and body weight resistance; include recumbent bike or NuStep (watch knee pain); avoid leg press and other back compressive resistance   Consulted and Agree with Plan of Care Patient      Patient will benefit from skilled therapeutic intervention in order to improve the following deficits and impairments:  Decreased activity tolerance, Other (comment) (lack of knowledge of safe exercise program for her to get benefits of this as she lives with metastatic cancer)  Visit Diagnosis: Other abnormalities of gait and mobility - Plan: PT plan of care cert/re-cert  Other symptoms and signs involving the musculoskeletal system - Plan: PT plan of care cert/re-cert  Low back pain associated with a spinal disorder other than radiculopathy or spinal stenosis - Plan: PT plan of care cert/re-cert     Problem List Patient Active Problem List   Diagnosis Date Noted  . Palliative care status   . Counseling regarding advanced care planning and goals of care 03/25/2017  . 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 Nausea without vomiting 03/21/2017  . Bone metastases ([place]) 02/01/2017  . 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 Malignant neoplasm metastatic to lung ([place])   . Cancer associated pain   . Metastatic breast cancer (  [place]) 01/21/2017  . Pathologic fracture   . Breast mass, left     SALISBURY,DONNA 08/03/2017, 1:14 PM  [place], [place], [address] Phone: [phone]   Fax:  [phone]  Name: Mikaiah Stoffer MRN: [id number] Date of Birth: 1956/03/18  Serafina Royals, PT 08/03/17 1:14 PM

## 2017-08-08 ENCOUNTER — Ambulatory Visit: Payer: BC Managed Care – PPO | Admitting: Physical Therapy

## 2017-08-08 ENCOUNTER — Encounter: Payer: Self-pay | Admitting: Genetic Counselor

## 2017-08-08 DIAGNOSIS — Z1379 Encounter for other screening for genetic and chromosomal anomalies: Secondary | ICD-10-CM

## 2017-08-08 DIAGNOSIS — Z7189 Other specified counseling: Secondary | ICD-10-CM | POA: Insufficient documentation

## 2017-08-12 ENCOUNTER — Ambulatory Visit: Payer: BC Managed Care – PPO | Admitting: Physical Therapy

## 2017-08-12 ENCOUNTER — Other Ambulatory Visit: Payer: Self-pay | Admitting: Pharmacist

## 2017-08-12 ENCOUNTER — Telehealth: Payer: Self-pay | Admitting: Pharmacist

## 2017-08-12 DIAGNOSIS — C50919 Malignant neoplasm of unspecified site of unspecified female breast: Secondary | ICD-10-CM

## 2017-08-12 DIAGNOSIS — M545 Low back pain, unspecified: Secondary | ICD-10-CM

## 2017-08-12 DIAGNOSIS — R2689 Other abnormalities of gait and mobility: Secondary | ICD-10-CM | POA: Diagnosis not present

## 2017-08-12 DIAGNOSIS — R197 Diarrhea, unspecified: Secondary | ICD-10-CM

## 2017-08-12 DIAGNOSIS — R29898 Other symptoms and signs involving the musculoskeletal system: Secondary | ICD-10-CM

## 2017-08-12 MED ORDER — ONDANSETRON HCL 4 MG PO TABS: 4.0000 mg | ORAL_TABLET | Freq: Two times a day (BID) | ORAL | 1 refills | Status: DC

## 2017-08-12 NOTE — Patient Instructions (Addendum)
   PELVIC TILT  Lie on back, legs bent. Exhale, tilting top of pelvis back, pubic bone up, to flatten lower back. Inhale, rolling pelvis opposite way, top forward, pubic bone down, arch in back. Repeat __10__ times. Do __2__ sessions per day. Copyright  VHI. All rights reserved.    Isometric Hold With Pelvic Floor (Hook-Lying)  Lie with hips and knees bent. Slowly inhale, and then exhale. Pull navel toward spine and tighten pelvic floor. Hold for __10_ seconds. Continue to breathe in and out during hold. Rest for _10__ seconds. Repeat __10_ times. Do __2-3_ times a day.   Knee Fold  Lie on back, legs bent, arms by sides. Exhale, lifting knee to chest. Inhale, returning. Keep abdominals flat, navel to spine. Repeat __10__ times, alternating legs. Do __2__ sessions per day.  Knee Drop  Keep pelvis stable. Without rotating hips, slowly drop knee to side, pause, return to center, bring knee across midline toward opposite hip. Feel obliques engaging. Repeat for ___10_ times each leg.   Copyright  VHI. All rights reserved.       Heel Slide to Straight   Slide one leg down to straight. Return. Be sure pelvis does not rock forward, tilt, rotate, or tip to side. Do _10__ times. Restabilize pelvis. Repeat with other leg. Do __1-2_ sets, __2_ times per day.  http://ss.exer.us/16   Copyright  VHI. All rights reserved.  HIP: Flexion / KNEE: Extension, Straight Leg Raise    Raise leg, keeping knee straight. Perform slowly. 10_ reps per set, SQUEEZE, LIFT, LOWER, RELAX    Copyright  VHI. All rights reserved.

## 2017-08-12 NOTE — Therapy (Signed)
[place], [place], [address] Phone: [phone]   Fax:  [phone]  Physical Therapy Treatment  Patient Details  Name: Stacey Cox MRN: [id number] Date of Birth: 11/22/1956 Referring Provider: Dr. Sullivan Lone  Encounter Date: 08/12/2017      PT End of Session - 08/12/17 1220    Visit Number 2   Number of Visits 9   Date for PT Re-Evaluation 09/09/17   PT Start Time 0930   PT Stop Time 1015   PT Time Calculation (min) 45 min   Activity Tolerance Patient tolerated treatment well   Behavior During Therapy [facility] for tasks assessed/performed      Past Medical History:  Diagnosis Date  . Cancer [facility])    Metastatic Breast Cancer    Past Surgical History:  Procedure Laterality Date  . CHOLECYSTECTOMY    . TONSILLECTOMY      There were no vitals filed for this visit.      Subjective Assessment - 08/12/17 0938    Subjective Pt states she did walk this week and it went well with no increase in back pain    Pertinent History Back pain led to diagnosis of left breast cancer 01/20/17. 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 Multifocal tumor in the left breast with biopsy-proven left axillary metastases and metastases in bones and lung.  Being treated with two medications.  Had radiation to her low back, 10 treatments, back in March, which helped with the pain. No other health issues.   Patient Stated Goals She is not sure what to expect.   Currently in Pain? No/denies                         [facility] Adult PT Treatment/Exercise - 08/12/17 0001      Lumbar Exercises: Aerobic   Tread Mill 5 minutes at 2.0, 0% grade with no difficutly      Lumbar Exercises: Supine   Straight Leg Raise 10 reps   Straight Leg Raises Limitations with each leg, core stabalized, then tighten, lift, lower , relax    Other Supine Lumbar Exercises pre pilates program,: pelvic  tilts, lower pelvic floor contraction, knee folds, knee drops, leg extension,  10 reps each                 PT Education - 08/12/17 1218    Education provided Yes   Education Details core stabalization with prepilates exercise    Person(s) Educated Patient   Methods Explanation;Demonstration;Handout   Comprehension Verbalized understanding;Returned demonstration                [facility] Goals - 08/03/17 1238      CC Long Term Goal  #1   Title Pt. will be able to perform a home exercise program for endurance and strengthening that does not aggravate low back pain.   Time 4   Period Weeks   Status New   Target Date 09/09/17     CC Long Term Goal  #2   Title Pt. will be knowledgeable about how to progress independently with her exercise program.   Time 4   Period Weeks   Status New   Target Date 09/09/17     CC Long Term Goal  #3   Title Pt. will be knowledgeable about community exercise program options or how to continue to exercise on her own after discharge.   Time 4  Period Weeks   Status New   Target Date 09/09/17     CC Long Term Goal  #4   Title Pt. will increase 30 second sit to stand to 15 reps or more.   Baseline 12 reps at eval   Time 4   Period Weeks   Status New   Target Date 09/09/17     CC Long Term Goal  #5   Title Reduce quick DASH score to 10 or less.   Baseline 20.45 at eval   Time 4   Period Weeks   Status New            Plan - 08/12/17 1221    Clinical Impression Statement Pt did extremely will with initial exercise session.  She feels that she will be able to follow through with core exercise at home.  Wants to work on balance activites next session    Clinical Impairments Affecting Rehab Potential metastases to bone and lungs; knee pain aggravated by certain activities   PT Next Visit Plan focus on standing balance exercises next session Continue exercise program and HEP instruction with active exercises and body  weight resistance; include recumbent bike or NuStep (watch knee pain) or treadmill as pt chose 2nd visit ; avoid leg press and other back compressive resistance   Consulted and Agree with Plan of Care Patient      Patient will benefit from skilled therapeutic intervention in order to improve the following deficits and impairments:  Decreased activity tolerance, Other (comment)  Visit Diagnosis: Other abnormalities of gait and mobility  Other symptoms and signs involving the musculoskeletal system  Low back pain associated with a spinal disorder other than radiculopathy or spinal stenosis     Problem List Patient Active Problem List   Diagnosis Date Noted  . Genetic testing 08/08/2017  . Palliative care status   . Counseling regarding advanced care planning and goals of care 03/25/2017  . 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 Nausea without vomiting 03/21/2017  . Bone metastases ([place]) 02/01/2017  . 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 Malignant neoplasm metastatic to lung ([place])   . Cancer associated pain   . Metastatic breast cancer ([place]) 01/21/2017  . Pathologic fracture   . Breast mass, left    Donato Heinz. Owens Shark PT  Norwood Levo 08/12/2017, 12:25 PM  [place], [place], [address] Phone: [phone]   Fax:  [phone]  Name: Lakaisha Danish MRN: [id number] Date of Birth: 1956-11-07

## 2017-08-12 NOTE — Telephone Encounter (Signed)
Oral Chemotherapy Pharmacist Encounter  Follow-Up Form  Spoke with patient today to follow up regarding patient's oral chemotherapy medication: Verzenio  Original Start date of oral chemotherapy: 05/11/17 Dose increase to 150mg  BID on 07/23/17  Pt is doing well today  Pt reports 0 tablets/doses of Verzenio 150mg  tablets, 1 tablet by mouth 2 times daily administered with or without food missed in the last 3 weeks.  Patient states she takes her Verzenio at 10am and 10pm daily.  Pt reports the following side effects: nausea and vomiting if she does not take her Verzenio with ondansetron. Ondansetron refilled with new directions to take with each dose of Verzenio, as this is how patient is taking her ondansetron. She vomits if she does not use the ondansetron. Patient in agreement to continue with nausea prophylaxis and continue Verzenio.  She is also experiencing 4-6 episodes of diarrhea daily which does interfere with ADLs. Patient has been using loperamide liquid 2mg  for symptom management. Patient will start taking loperamide 4mg  as prophylaxis 1st thing in the morning and then continue with symptom management dosing. I will check on patient week on 10/1 for continued loperamide diarrhea prophylaxis titration. She will call the office if symptoms are not management in the meantime. Patient in agreement to continue with diarrhea prophylaxis and continue Verzenio.  Patient is not experiencing any skin toxicities with Verzenio.  Pertinent labs reviewed: OK for continued use. Noted SCr=1.4, est CrCl~50, no dose adjustment needed at this time.  Patient knows to call the office with questions or concerns. [facility]cology Clinic will continue to follow.  Thank you,  Johny Drilling, PharmD, BCPS, BCOP 08/12/2017 9:32 AM [facility]cology Clinic [phone]

## 2017-08-17 ENCOUNTER — Other Ambulatory Visit: Payer: Self-pay | Admitting: Hematology

## 2017-08-17 ENCOUNTER — Other Ambulatory Visit: Payer: Self-pay

## 2017-08-17 MED ORDER — ERGOCALCIFEROL 1.25 MG (50000 UT) PO CAPS: ORAL_CAPSULE | ORAL | 0 refills | Status: DC

## 2017-08-19 ENCOUNTER — Telehealth: Payer: Self-pay | Admitting: *Deleted

## 2017-08-19 ENCOUNTER — Telehealth: Payer: Self-pay

## 2017-08-19 ENCOUNTER — Ambulatory Visit: Payer: BC Managed Care – PPO | Admitting: Physical Therapy

## 2017-08-19 NOTE — Telephone Encounter (Signed)
Pt c/o severe sore throat this AM. Called pt back and has been sleeping most of the day. Sore throat not as bad, but still moderately sore. No other symptoms of infection at this time. Noted some whiteness on tongue, but pt does not see any redness of throat upon assessment in mirror. Pt denies fever or chills. Pt denies congestion or SOB. At this time, Dr. Irene Limbo recommends pt to use a home remedy salt water gargle: 1/2 liter water (room temperature), 1 tsp salt, a1 tsp baking soda. Pt can use as needed for comfort. If pt notes progressing symptoms, especially fever, she is to go to urgent care or if next week, to let our office know so that we can see her. Pt verbalized understanding of all information.

## 2017-08-19 NOTE — Telephone Encounter (Signed)
"  I have a pretty bad sore throat.  Wondering if Dr. Irene Limbo or a pharmacist can call something in for me.  [phone]."   Routing call information to collaborative nurse and provider for review.  Further patient communication through collaborative nurse.

## 2017-08-20 ENCOUNTER — Other Ambulatory Visit: Payer: Self-pay | Admitting: Hematology

## 2017-08-20 ENCOUNTER — Emergency Department (HOSPITAL_COMMUNITY)
Admission: EM | Admit: 2017-08-20 | Discharge: 2017-08-20 | Disposition: A | Discharge status: Home / Self Care | Payer: BC Managed Care – PPO | Attending: Emergency Medicine | Admitting: Emergency Medicine

## 2017-08-20 ENCOUNTER — Encounter (HOSPITAL_COMMUNITY): Payer: Self-pay | Admitting: Emergency Medicine

## 2017-08-20 DIAGNOSIS — J029 Acute pharyngitis, unspecified: Secondary | ICD-10-CM | POA: Diagnosis present

## 2017-08-20 DIAGNOSIS — Z7982 Long term (current) use of aspirin: Secondary | ICD-10-CM | POA: Insufficient documentation

## 2017-08-20 DIAGNOSIS — Z79899 Other long term (current) drug therapy: Secondary | ICD-10-CM | POA: Diagnosis not present

## 2017-08-20 DIAGNOSIS — Z791 Long term (current) use of non-steroidal anti-inflammatories (NSAID): Secondary | ICD-10-CM | POA: Insufficient documentation

## 2017-08-20 DIAGNOSIS — H9201 Otalgia, right ear: Secondary | ICD-10-CM | POA: Insufficient documentation

## 2017-08-20 DIAGNOSIS — Z87891 Personal history of nicotine dependence: Secondary | ICD-10-CM | POA: Diagnosis not present

## 2017-08-20 LAB — RAPID STREP SCREEN (MED CTR MEBANE ONLY): Streptococcus, Group A Screen (Direct): NEGATIVE

## 2017-08-20 MED ORDER — IBUPROFEN 600 MG PO TABS: 600.0000 mg | ORAL_TABLET | Freq: Four times a day (QID) | ORAL | 0 refills | Status: DC | PRN

## 2017-08-20 MED ORDER — HYDROCODONE-ACETAMINOPHEN 5-325 MG PO TABS: 1.0000 | ORAL_TABLET | ORAL | 0 refills | Status: DC | PRN

## 2017-08-20 NOTE — ED Triage Notes (Signed)
Pt states she started having a sore throat yesterday  Pt has been using warm salt water gargles   Pt states the sore throat has gotten worse and now her right ear hurts  Pt is a cancer pt  Pt has a low grade fever this morning

## 2017-08-20 NOTE — ED Provider Notes (Signed)
[facility] DEPT Provider Note   CSN: [id number] Arrival date & time: 08/20/17  7062     History   Chief Complaint Chief Complaint  Patient presents with  . Sore Throat  . Otalgia    HPI Stacey Cox is a 61 y.o. female.  HPI Patient has history of metastatic breast cancer. Patient is taking letrozole and Verzenio. 2 days ago she started developing a sore throat. She reports it's painful when she swallows. No difficulty swallowing. She has been drinking additional orange juice and doing saltwater gargles without relief. Patient also notes a swallowing or some radiation of pain to her right ear. She has been monitoring her temperature and maximum temperature at home has been 99. The patient did contact her oncologist who suggested evaluation. She has no associated cough, chest pain or chest congestion. She perceived just slight nasal drainage and posterior discharge. No other associated symptoms. Past Medical History:  Diagnosis Date  . Cancer [facility])    Metastatic Breast Cancer    Patient Active Problem List   Diagnosis Date Noted  . Genetic testing 08/08/2017  . Palliative care status   . Counseling regarding advanced care planning and goals of care 03/25/2017  . 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 Nausea without vomiting 03/21/2017  . Bone metastases ([place]) 02/01/2017  . 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 Malignant neoplasm metastatic to lung ([place])   . Cancer associated pain   . Metastatic breast cancer ([place]) 01/21/2017  . Pathologic fracture   . Breast mass, left     Past Surgical History:  Procedure Laterality Date  . CHOLECYSTECTOMY    . TONSILLECTOMY      OB History    No data available       Home Medications    Prior to Admission medications   Medication Sig Start Date End Date Taking? Authorizing Provider  aspirin EC 81 MG tablet Take 1 tablet (81 mg total) by mouth daily. 08/01/17   [name], MD  ergocalciferol (VITAMIN D2)  50000 units capsule Take one capsule by mouth three times weekly. 08/17/17   [name], MD  escitalopram (LEXAPRO) 10 MG tablet TAKE 1 TABLET BY MOUTH DAILY 06/28/17   [name], MD  HYDROcodone-acetaminophen (NORCO/VICODIN) 5-325 MG tablet Take 1-2 tablets by mouth every 4 (four) hours as needed for moderate pain or severe pain. 08/20/17   Charlesetta Shanks, MD  ibuprofen (ADVIL,MOTRIN) 600 MG tablet Take 1 tablet (600 mg total) by mouth every 6 (six) hours as needed. 08/20/17   Charlesetta Shanks, MD  letrozole [facility]) 2.5 MG tablet Take 1 tablet (2.5 mg total) by mouth daily. 05/16/17   [name], MD  loperamide (IMODIUM) 1 MG/5ML solution Take 2 mg by mouth as needed for diarrhea or loose stools.     [provider]  LORazepam (ATIVAN) 0.5 MG tablet Take 1 tablet (0.5 mg total) by mouth every 6 (six) hours as needed for anxiety. 06/21/17   [name], MD  ondansetron (ZOFRAN) 4 MG tablet Take 1 tablet (4 mg total) by mouth 2 (two) times daily. Take with Verzenio. 08/12/17   [name], MD  VERZENIO 150 MG TABS TAKE 150 MG BY MOUTH 2 (TWO) TIMES DAILY. 08/01/17   [name], MD    Family History Family History  Problem Relation Age of Onset  . Cancer Mother        breast  . Cancer Sister  breast    Social History Social History  Substance Use Topics  . Smoking status: Former Smoker    Packs/day: 0.00    Types: Cigarettes    Quit date: 01/21/2017  . Smokeless tobacco: Never Used  . Alcohol use No     Allergies   Thorazine [chlorpromazine] and Ribociclib   Review of Systems Review of Systems 10 Systems reviewed and are negative for acute change except as noted in the HPI.   Physical Exam Updated Vital Signs BP (!) 138/93 (BP Location: Left Arm)   Pulse 94   Temp 99.2 F (37.3 C) (Oral)   Resp 16   SpO2 98%   Physical Exam  Constitutional: She is oriented to person, place, and time. She appears  well-developed and well-nourished. No distress.  HENT:  Head: Normocephalic and atraumatic.  Bilateral TMs normal. Oropharynx is clear. Posterior oropharynx has no erythema no exudates. Uvula is midline and not enlarged. Mucus memories pink and moist. No facial swelling. No trismus. Neck supple without lymphadenopathy. No cervical tenderness to palpation. Pain is reproducible with swallowing. No difficulty breathing or handling secretions. No stridor.  Eyes: Conjunctivae and EOM are normal.  Neck: Neck supple.  Cardiovascular: Normal rate, regular rhythm and intact distal pulses.   No murmur heard. Pulmonary/Chest: Effort normal and breath sounds normal. No respiratory distress.  Abdominal: Soft. There is no tenderness.  Musculoskeletal: She exhibits no edema.  Neurological: She is alert and oriented to person, place, and time. No cranial nerve deficit. She exhibits normal muscle tone. Coordination normal.  Skin: Skin is warm and dry.  Psychiatric: She has a normal mood and affect.  Nursing note and vitals reviewed.    ED Treatments / Results  Labs (all labs ordered are listed, but only abnormal results are displayed) Labs Reviewed  RAPID STREP SCREEN (NOT AT [facility])    EKG  EKG Interpretation None       Radiology No results found.  Procedures Procedures (including critical care time)  Medications Ordered in ED Medications - No data to display   Initial Impression / Assessment and Plan / ED Course  I have reviewed the triage vital signs and the nursing notes.  Pertinent labs & imaging results that were available during my care of the patient were reviewed by me and considered in my medical decision making (see chart for details).     Final Clinical Impressions(s) / ED Diagnoses   Final diagnoses:  Pharyngitis, unspecified etiology  At this time, patient has pharyngeal pain which is consistent with most likely viral etiology. There are no associated symptoms of  difficulty breathing or handling secretions. No findings on physical examination of lymphadenopathy or neck masses. Obviously with patient's history of metastatic cancer, there would always be concerned for any cancer related complications or masses. Physical examination however does not show any clinical findings of this. At this time, I feel the patient is stable to be treated for pain and discomfort. She is counseled on return signs and symptoms. Patient is to have close follow-up with her oncologist to determine if advanced imaging study is indicated. Patient does not have erythema or exudate. Strep was obtained. At this time, without positive physical findings or fever, will not initiate empiric antibiotics.  New Prescriptions New Prescriptions   HYDROCODONE-ACETAMINOPHEN (NORCO/VICODIN) 5-325 MG TABLET    Take 1-2 tablets by mouth every 4 (four) hours as needed for moderate pain or severe pain.   IBUPROFEN (ADVIL,MOTRIN) 600 MG TABLET    Take  1 tablet (600 mg total) by mouth every 6 (six) hours as needed.     Charlesetta Shanks, MD 08/20/17 1008

## 2017-08-22 ENCOUNTER — Other Ambulatory Visit: Payer: Self-pay

## 2017-08-22 ENCOUNTER — Encounter: Payer: Self-pay | Admitting: Physical Therapy

## 2017-08-22 ENCOUNTER — Ambulatory Visit: Payer: BC Managed Care – PPO | Attending: Hematology | Admitting: Physical Therapy

## 2017-08-22 DIAGNOSIS — M545 Low back pain, unspecified: Secondary | ICD-10-CM

## 2017-08-22 DIAGNOSIS — R2689 Other abnormalities of gait and mobility: Secondary | ICD-10-CM

## 2017-08-22 DIAGNOSIS — R29898 Other symptoms and signs involving the musculoskeletal system: Secondary | ICD-10-CM | POA: Insufficient documentation

## 2017-08-22 NOTE — Therapy (Signed)
[place], [place], [address] Phone: [phone]   Fax:  [phone]  Physical Therapy Treatment  Patient Details  Name: Stacey Cox MRN: [id number] Date of Birth: Mar 20, 1956 Referring Provider: Dr. Sullivan Lone  Encounter Date: 08/22/2017      PT End of Session - 08/22/17 1457    Visit Number 3   Number of Visits 9   Date for PT Re-Evaluation 09/09/17   PT Start Time 1302   PT Stop Time 1342   PT Time Calculation (min) 40 min   Activity Tolerance Patient tolerated treatment well   Behavior During Therapy [facility] for tasks assessed/performed      Past Medical History:  Diagnosis Date  . Cancer [facility])    Metastatic Breast Cancer    Past Surgical History:  Procedure Laterality Date  . CHOLECYSTECTOMY    . TONSILLECTOMY      There were no vitals filed for this visit.      Subjective Assessment - 08/22/17 1303    Subjective I am not feeling that great. I had to cancel my appointment on Friday because I am catching a cold.    Pertinent History Back pain led to diagnosis of left breast cancer 01/20/17. 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 Multifocal tumor in the left breast with biopsy-proven left axillary metastases and metastases in bones and lung.  Being treated with two medications.  Had radiation to her low back, 10 treatments, back in March, which helped with the pain. No other health issues.   Patient Stated Goals She is not sure what to expect.   Currently in Pain? No/denies   Pain Score 0-No pain                         [facility] Adult PT Treatment/Exercise - 08/22/17 0001      Neuro Re-ed    Neuro Re-ed Details  balance exercises: standing on foam, Airex with narrow BOS x 1 min, standing on foam with eyes closed x 1 min, standing on foam narrow BOS with eyes closed x 1 min, tandem stance alternating with each foot forward x 1 min, SLS one  each x 30-60 sec each, with head turns with narrow BOS x 10, trunk rotations x 10 on Airex, then walking down hallway and making quick turns left and right since this is when pt states she looses balance     Lumbar Exercises: Aerobic   Tread Mill 5 minutes at 2.0, 0% grade with no difficutly      Lumbar Exercises: Supine   Other Supine Lumbar Exercises pelvic tilts 5 sec holds x 10, bridges 3 sec holds x 10 while maintaing pelvic tilt, with heel slide x 10 reps each side, pelvic tilts with clamshell x 10, pelvic tilts with SLR x 8 bilaterally                        Long Term Clinic Goals - 08/03/17 1238      CC Long Term Goal  #1   Title Pt. will be able to perform a home exercise program for endurance and strengthening that does not aggravate low back pain.   Time 4   Period Weeks   Status New   Target Date 09/09/17     CC Long Term Goal  #2   Title Pt. will be knowledgeable about how to progress independently with her exercise program.  Time 4   Period Weeks   Status New   Target Date 09/09/17     CC Long Term Goal  #3   Title Pt. will be knowledgeable about community exercise program options or how to continue to exercise on her own after discharge.   Time 4   Period Weeks   Status New   Target Date 09/09/17     CC Long Term Goal  #4   Title Pt. will increase 30 second sit to stand to 15 reps or more.   Baseline 12 reps at eval   Time 4   Period Weeks   Status New   Target Date 09/09/17     CC Long Term Goal  #5   Title Reduce quick DASH score to 10 or less.   Baseline 20.45 at eval   Time 4   Period Weeks   Status New            Plan - 08/22/17 1458    Clinical Impression Statement Pt was states she is not feeling well today but has not been running a fever. She later stated she had vomited before her session today. Added balance exercises today on AirEx foam as well as walking and changing directions since this is when pt reports she looses  balance. Continued with core exercises today. Pt felt challenged with balance exercises on foam. Pt had to leave at end of session because she wasn't feeling well and vomited again in the bathroom. She states she has been vomiting whenever she eats something.    Rehab Potential Good   Clinical Impairments Affecting Rehab Potential metastases to bone and lungs; knee pain aggravated by certain activities   PT Frequency 2x / week   PT Duration 4 weeks   PT Treatment/Interventions ADLs/Self Care Home Management;Aquatic Therapy;Scientist, product/process development;Therapeutic exercise;Patient/family education;Manual techniques   PT Next Visit Plan continue standing balance exercises next session Continue exercise program and HEP instruction with active exercises and body weight resistance; include recumbent bike or NuStep (watch knee pain) or treadmill as pt chose 2nd visit ; avoid leg press and other back compressive resistance   Consulted and Agree with Plan of Care Patient      Patient will benefit from skilled therapeutic intervention in order to improve the following deficits and impairments:  Decreased activity tolerance, Other (comment)  Visit Diagnosis: Other abnormalities of gait and mobility  Low back pain associated with a spinal disorder other than radiculopathy or spinal stenosis     Problem List Patient Active Problem List   Diagnosis Date Noted  . Genetic testing 08/08/2017  . Palliative care status   . Counseling regarding advanced care planning and goals of care 03/25/2017  . 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 Nausea without vomiting 03/21/2017  . Bone metastases ([place]) 02/01/2017  . 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 Malignant neoplasm metastatic to lung ([place])   . Cancer associated pain   . Metastatic breast cancer ([place]) 01/21/2017  . Pathologic fracture   . Breast mass, left     Allyson Sabal [facility] 08/22/2017, 3:01  PM  [place], [place], [address] Phone: [phone]   Fax:  [phone]  Name: Nolah Krenzer MRN: [id number] Date of Birth: Aug 22, 1956  Manus Gunning, PT 08/22/17 3:01 PM

## 2017-08-23 LAB — CULTURE, GROUP A STREP (THRC)

## 2017-08-24 ENCOUNTER — Ambulatory Visit: Payer: BC Managed Care – PPO | Admitting: Physical Therapy

## 2017-08-26 ENCOUNTER — Telehealth: Payer: Self-pay | Admitting: Pharmacist

## 2017-08-26 NOTE — Telephone Encounter (Signed)
Oral Chemotherapy Pharmacist Encounter  Follow-Up Form  Spoke with patient today to follow up regarding patient's oral chemotherapy medication: Verzenio  Original Start date of oral chemotherapy: 05/11/17 Dose increase to 150mg  BID on 07/23/17  Pt is doing well today  Pt reports 0 tablets/doses of Verzenio 150mg  tablets, 1 tablet by mouth 2 times daily administered with or without food missed in the last month.  Patient states she takes her Verzenio at 10am and 10pm daily.  Pt reports the following side effects: N/V controlled with ondansetron. No new issues since new ondansetron Rx called in.  Patient in agreement to continue with nausea prophylaxis and continue Verzenio.  She is also experiencing 2-3 episodes of diarrhea daily, decreased from 4-6 episodes daily prior to loperamide prophylaxis initiated 08/13/17.   Patient will start taking loperamide 4mg  as prophylaxis in AM and PM, and continue with symptom management dosing. Will follow-up with patient when she is back in the office on 10/24 for continued loperamide diarrhea prophylaxis titration. She will call the office if symptoms are not management in the meantime.   Patient in agreement to continue with diarrhea prophylaxis and continue Verzenio.  Patient is not experiencing any skin toxicities with Verzenio.  Pertinent labs reviewed: OK for continued use. Noted SCr=1.4, est CrCl~50, no dose adjustment needed at this time.  Patient knows to call the office with questions or concerns. Oral Oncology Clinic will continue to follow.  Thank you,  Johny Drilling, PharmD, BCPS, BCOP 08/26/2017  4:14 PM  Oral Oncology Clinic [phone]

## 2017-08-29 ENCOUNTER — Other Ambulatory Visit: Payer: Self-pay

## 2017-08-29 ENCOUNTER — Other Ambulatory Visit: Payer: Self-pay | Admitting: Hematology

## 2017-08-29 MED ORDER — ABEMACICLIB 150 MG PO TABS: 150.0000 mg | ORAL_TABLET | Freq: Two times a day (BID) | ORAL | 0 refills | Status: DC

## 2017-09-01 ENCOUNTER — Telehealth: Payer: Self-pay | Admitting: Hematology

## 2017-09-01 MED FILL — VERZENIO 150 MG TAB: 150 | 28 days supply | Qty: 56 | Fill #0

## 2017-09-01 NOTE — Telephone Encounter (Signed)
Spoke with patient regarding added appointment per 10/11 sch msg

## 2017-09-05 ENCOUNTER — Ambulatory Visit (HOSPITAL_BASED_OUTPATIENT_CLINIC_OR_DEPARTMENT_OTHER): Payer: BC Managed Care – PPO

## 2017-09-05 ENCOUNTER — Ambulatory Visit: Payer: BC Managed Care – PPO | Admitting: Physical Therapy

## 2017-09-05 ENCOUNTER — Other Ambulatory Visit (HOSPITAL_BASED_OUTPATIENT_CLINIC_OR_DEPARTMENT_OTHER): Payer: BC Managed Care – PPO

## 2017-09-05 VITALS — BP 135/80 | HR 84 | Temp 98.2°F | Resp 16

## 2017-09-05 DIAGNOSIS — Z7189 Other specified counseling: Secondary | ICD-10-CM

## 2017-09-05 DIAGNOSIS — C7951 Secondary malignant neoplasm of bone: Secondary | ICD-10-CM

## 2017-09-05 DIAGNOSIS — M545 Low back pain, unspecified: Secondary | ICD-10-CM

## 2017-09-05 DIAGNOSIS — C50812 Malignant neoplasm of overlapping sites of left female breast: Secondary | ICD-10-CM | POA: Diagnosis not present

## 2017-09-05 DIAGNOSIS — R29898 Other symptoms and signs involving the musculoskeletal system: Secondary | ICD-10-CM

## 2017-09-05 DIAGNOSIS — R2689 Other abnormalities of gait and mobility: Secondary | ICD-10-CM | POA: Diagnosis not present

## 2017-09-05 DIAGNOSIS — C50919 Malignant neoplasm of unspecified site of unspecified female breast: Secondary | ICD-10-CM

## 2017-09-05 DIAGNOSIS — E559 Vitamin D deficiency, unspecified: Secondary | ICD-10-CM

## 2017-09-05 LAB — COMPREHENSIVE METABOLIC PANEL
ALT: 41 U/L (ref 0–55)
ANION GAP: 10 meq/L (ref 3–11)
AST: 25 U/L (ref 5–34)
Albumin: 3.8 g/dL (ref 3.5–5.0)
Alkaline Phosphatase: 72 U/L (ref 40–150)
BUN: 12.3 mg/dL (ref 7.0–26.0)
CALCIUM: 9.5 mg/dL (ref 8.4–10.4)
CHLORIDE: 108 meq/L (ref 98–109)
CO2: 23 meq/L (ref 22–29)
Creatinine: 1.2 mg/dL — ABNORMAL HIGH (ref 0.6–1.1)
EGFR: 55 mL/min/{1.73_m2} — AB (ref >60)
Glucose: 103 mg/dl (ref 70–140)
POTASSIUM: 3.6 meq/L (ref 3.5–5.1)
Sodium: 141 mEq/L (ref 136–145)
Total Bilirubin: 0.43 mg/dL (ref 0.20–1.20)
Total Protein: 6.9 g/dL (ref 6.4–8.3)

## 2017-09-05 LAB — CBC & DIFF AND RETIC
BASO%: 1.8 % (ref 0.0–2.0)
Basophils Absolute: 0.1 10*3/uL (ref 0.0–0.1)
EOS%: 1.1 % (ref 0.0–7.0)
Eosinophils Absolute: 0.1 10*3/uL (ref 0.0–0.5)
HEMATOCRIT: 39.8 % (ref 34.8–46.6)
HGB: 13.2 g/dL (ref 11.6–15.9)
Immature Retic Fract: 3.9 % (ref 1.60–10.00)
LYMPH#: 1.2 10*3/uL (ref 0.9–3.3)
LYMPH%: 27.3 % (ref 14.0–49.7)
MCH: 29.9 pg (ref 25.1–34.0)
MCHC: 33.2 g/dL (ref 31.5–36.0)
MCV: 90 fL (ref 79.5–101.0)
MONO#: 0.2 10*3/uL (ref 0.1–0.9)
MONO%: 5.5 % (ref 0.0–14.0)
NEUT%: 64.3 % (ref 38.4–76.8)
NEUTROS ABS: 2.8 10*3/uL (ref 1.5–6.5)
PLATELETS: 222 10*3/uL (ref 145–400)
RBC: 4.42 10*6/uL (ref 3.70–5.45)
RDW: 17.4 % — ABNORMAL HIGH (ref 11.2–14.5)
RETIC CT ABS: 70.28 10*3/uL (ref 33.70–90.70)
Retic %: 1.59 % (ref 0.70–2.10)
WBC: 4.4 10*3/uL (ref 3.9–10.3)
nRBC: 0 % (ref 0–0)

## 2017-09-05 MED ORDER — DENOSUMAB 120 MG/1.7ML ~~LOC~~ SOLN
120.0000 mg | Freq: Once | SUBCUTANEOUS | Status: AC
  Administered 2017-09-05: 120 mg via SUBCUTANEOUS
  Filled 2017-09-05: qty 1.7

## 2017-09-05 NOTE — Therapy (Signed)
[place], [place], [address] Phone: [phone]   Fax:  [phone]  Physical Therapy Treatment  Patient Details  Name: Stacey Cox MRN: [id number] Date of Birth: Feb 04, 1956 Referring Provider: Dr. Sullivan Lone  Encounter Date: 09/05/2017      PT End of Session - 09/05/17 1624    Visit Number 4   Number of Visits 9   Date for PT Re-Evaluation 09/09/17   PT Start Time 9702   PT Stop Time 1436   PT Time Calculation (min) 51 min   Activity Tolerance Patient tolerated treatment well   Behavior During Therapy [facility] for tasks assessed/performed      Past Medical History:  Diagnosis Date  . Cancer [facility])    Metastatic Breast Cancer    Past Surgical History:  Procedure Laterality Date  . CHOLECYSTECTOMY    . TONSILLECTOMY      There were no vitals filed for this visit.      Subjective Assessment - 09/05/17 1349    Subjective Was sick last time because of the medicine (ibuprofen, larger dose) and it was too heavy on my stomach.  I'm going to [place] for a couple of days for fun, so I had to cancel my Wednesday appointment. I have to start doing the core exercises at home. Has been trying to do more walking--parking further away, etc.   Pertinent History Back pain led to diagnosis of left breast cancer 01/20/17. 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 Multifocal tumor in the left breast with biopsy-proven left axillary metastases and metastases in bones and lung.  Being treated with two medications.  Had radiation to her low back, 10 treatments, back in March, which helped with the pain. No other health issues.   Currently in Pain? No/denies                         [facility] Adult PT Treatment/Exercise - 09/05/17 0001      Balance Poses: Yoga   Warrior I 2 reps;15 seconds  2 reps with each leg forward   Tree Pose 15 seconds;1 rep  1 rep with each  leg supporting     Lumbar Exercises: Standing   Other Standing Lumbar Exercises stand holding physioball out front, do slight squat, then straighten up, bringing arms overhead and tightening abdominal muscles x 8  c/o mild dizziness     Lumbar Exercises: Supine   Bridge 10 reps;1 second   Straight Leg Raise 10 reps;1 second   Straight Leg Raises Limitations with each leg, core stabalized, then tighten, lift, lower , relax    Other Supine Lumbar Exercises pre pilates program,: pelvic tilts, lower pelvic floor contraction, knee folds, knee drops, leg extension,  10 reps each      Lumbar Exercises: Quadruped   Single Arm Raise Right;Left;5 reps   Straight Leg Raise 5 reps;1 second   Opposite Arm/Leg Raise Right arm/Left leg;Left arm/Right leg;5 reps                        Long Term Clinic Goals - 09/05/17 1628      CC Long Term Goal  #1   Title Pt. will be able to perform a home exercise program for endurance and strengthening that does not aggravate low back pain.   Status On-going     CC Long Term Goal  #2   Title Pt. will be  knowledgeable about how to progress independently with her exercise program.   Status On-going     CC Long Term Goal  #3   Title Pt. will be knowledgeable about community exercise program options or how to continue to exercise on her own after discharge.   Status On-going     CC Long Term Goal  #4   Title Pt. will increase 30 second sit to stand to 15 reps or more.   Status On-going            Plan - 09/05/17 1624    Clinical Impression Statement Pt. reported that it was a medication that bothered her stomach and caused vomiting last session; she has stopped that medication and is feeling better.  She said she didn't feel she needed to walk on the treadmill today because she has been walking at home.  She chose to focus on core exercises, so current HEP was reviewed and other exercises focusing on stabilization were tried.  She will be  out of town later in the week and so canceled her 2nd appointment this week, but was encouraged to continue her HEP while away; she has not done it much so far on her own.   Rehab Potential Good   Clinical Impairments Affecting Rehab Potential metastases to bone and lungs; knee pain aggravated by certain activities   PT Frequency 2x / week   PT Duration 4 weeks   PT Treatment/Interventions ADLs/Self Care Home Management;Aquatic Therapy;Scientist, product/process development;Therapeutic exercise;Patient/family education;Manual techniques   PT Next Visit Plan continue standing balance exercises; continue exercise program and HEP instruction with active exercises and body weight resistance; possibly include recumbent bike or NuStep (watch knee pain) or treadmill as pt chose 2nd visit; avoid leg press and other back compressive resistance   PT Home Exercise Plan pre-Pilates core exercises   Consulted and Agree with Plan of Care Patient      Patient will benefit from skilled therapeutic intervention in order to improve the following deficits and impairments:  Decreased activity tolerance, Other (comment)  Visit Diagnosis: Other abnormalities of gait and mobility  Low back pain associated with a spinal disorder other than radiculopathy or spinal stenosis  Other symptoms and signs involving the musculoskeletal system     Problem List Patient Active Problem List   Diagnosis Date Noted  . Genetic testing 08/08/2017  . Palliative care status   . Counseling regarding advanced care planning and goals of care 03/25/2017  . 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 Nausea without vomiting 03/21/2017  . Bone metastases ([place]) 02/01/2017  . 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 Malignant neoplasm metastatic to lung ([place])   . Cancer associated pain   . Metastatic breast cancer ([place]) 01/21/2017  . Pathologic fracture   . Breast mass, left      Shahid Flori 09/05/2017, 4:29 PM  [place], [place], [address] Phone: [phone]   Fax:  [phone]  Name: Meleni Delahunt MRN: [id number] Date of Birth: [birth year]  Serafina Royals, PT 09/05/17 4:29 PM

## 2017-09-06 LAB — VITAMIN D 25 HYDROXY (VIT D DEFICIENCY, FRACTURES): Vitamin D, 25-Hydroxy: 26.5 ng/mL — ABNORMAL LOW (ref 30.0–100.0)

## 2017-09-07 ENCOUNTER — Encounter: Payer: BC Managed Care – PPO | Admitting: Physical Therapy

## 2017-09-09 ENCOUNTER — Other Ambulatory Visit: Payer: Self-pay

## 2017-09-09 DIAGNOSIS — C50912 Malignant neoplasm of unspecified site of left female breast: Secondary | ICD-10-CM

## 2017-09-12 ENCOUNTER — Other Ambulatory Visit: Payer: BC Managed Care – PPO

## 2017-09-12 ENCOUNTER — Ambulatory Visit (HOSPITAL_COMMUNITY)
Admission: RE | Admit: 2017-09-12 | Discharge: 2017-09-12 | Disposition: A | Discharge status: Home / Self Care | Payer: BC Managed Care – PPO | Source: Ambulatory Visit | Attending: Hematology | Admitting: Hematology

## 2017-09-12 ENCOUNTER — Encounter (HOSPITAL_COMMUNITY): Payer: Self-pay

## 2017-09-12 ENCOUNTER — Telehealth: Payer: Self-pay

## 2017-09-12 DIAGNOSIS — R918 Other nonspecific abnormal finding of lung field: Secondary | ICD-10-CM | POA: Insufficient documentation

## 2017-09-12 DIAGNOSIS — R609 Edema, unspecified: Secondary | ICD-10-CM | POA: Insufficient documentation

## 2017-09-12 DIAGNOSIS — C50919 Malignant neoplasm of unspecified site of unspecified female breast: Secondary | ICD-10-CM | POA: Diagnosis not present

## 2017-09-12 DIAGNOSIS — K760 Fatty (change of) liver, not elsewhere classified: Secondary | ICD-10-CM | POA: Insufficient documentation

## 2017-09-12 DIAGNOSIS — K6389 Other specified diseases of intestine: Secondary | ICD-10-CM | POA: Diagnosis not present

## 2017-09-12 DIAGNOSIS — D739 Disease of spleen, unspecified: Secondary | ICD-10-CM | POA: Diagnosis not present

## 2017-09-12 DIAGNOSIS — M5136 Other intervertebral disc degeneration, lumbar region: Secondary | ICD-10-CM | POA: Insufficient documentation

## 2017-09-12 DIAGNOSIS — C7951 Secondary malignant neoplasm of bone: Secondary | ICD-10-CM | POA: Diagnosis present

## 2017-09-12 DIAGNOSIS — I2510 Atherosclerotic heart disease of native coronary artery without angina pectoris: Secondary | ICD-10-CM | POA: Diagnosis not present

## 2017-09-12 NOTE — Telephone Encounter (Signed)
Pt called that she has labs at 1 pm and Ct at 2 pm. She had her labs drawn last week. Does she need her labs done again today. S/w Dr Irene Limbo and the labs do not need to be done today. Discussed tumor markers and he said OK to wait until next lab draw. Called pt to let her know.

## 2017-09-14 ENCOUNTER — Encounter: Payer: Self-pay | Admitting: Hematology

## 2017-09-14 ENCOUNTER — Ambulatory Visit (HOSPITAL_BASED_OUTPATIENT_CLINIC_OR_DEPARTMENT_OTHER): Payer: BC Managed Care – PPO | Admitting: Hematology

## 2017-09-14 VITALS — BP 141/92 | HR 74 | Temp 98.9°F | Resp 18 | Ht 65.0 in | Wt 171.0 lb

## 2017-09-14 DIAGNOSIS — C7951 Secondary malignant neoplasm of bone: Secondary | ICD-10-CM

## 2017-09-14 DIAGNOSIS — D701 Agranulocytosis secondary to cancer chemotherapy: Secondary | ICD-10-CM | POA: Diagnosis not present

## 2017-09-14 DIAGNOSIS — C773 Secondary and unspecified malignant neoplasm of axilla and upper limb lymph nodes: Secondary | ICD-10-CM

## 2017-09-14 DIAGNOSIS — Z23 Encounter for immunization: Secondary | ICD-10-CM

## 2017-09-14 DIAGNOSIS — Z170 Estrogen receptor positive status [ER+]: Secondary | ICD-10-CM

## 2017-09-14 DIAGNOSIS — C7800 Secondary malignant neoplasm of unspecified lung: Secondary | ICD-10-CM

## 2017-09-14 DIAGNOSIS — C50919 Malignant neoplasm of unspecified site of unspecified female breast: Secondary | ICD-10-CM

## 2017-09-14 DIAGNOSIS — C50812 Malignant neoplasm of overlapping sites of left female breast: Secondary | ICD-10-CM | POA: Diagnosis not present

## 2017-09-14 MED ORDER — PNEUMOCOCCAL 13-VAL CONJ VACC IM SUSP
0.5000 mL | Freq: Once | INTRAMUSCULAR | Status: AC
  Administered 2017-09-14: 0.5 mL via INTRAMUSCULAR
  Filled 2017-09-14: qty 0.5

## 2017-09-14 NOTE — Progress Notes (Signed)
[name]    HEMATOLOGY/ONCOLOGY CLINIC NOTE  Date of Service: 09/14/17   Patient Care Team: Patient, No Pcp Per as PCP - General (General Practice)  CHIEF COMPLAINTS/PURPOSE OF CONSULTATION:  F/u for breast cancer HISTORY OF PRESENTING ILLNESS:   plz see previous notes for details.  INTERVAL HISTORY  Of note since patient last visit, she had a CT C/A/P on 09/12/2017 with results revealing: IMPRESSION: 1. The pulmonary nodules have essentially resolved, and there is significant improvement in prior thoracic adenopathy. 2. Numerous scattered osseous metastatic lesions are now highly sclerotic. Given the pattern with the soft tissue metastatic disease, I suspect that this represents a sclerotic healing response of pre-existing lesions rather than progressive disease. 3. Significant improvement in architectural distortion and cutaneous thickening in the left breast. 4. 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 This is close to the pancreatic tail but not tangential to the pancreatic tail. Surveillance suggested. 5. Diffuse wall thickening in the colon, potentially from low-grade colitis. There is also some low-level mesenteric and presacral edema which is nonspecific. 6. Other imaging findings of potential clinical significance: Aortic Atherosclerosis (ICD10-I70.0). Coronary atherosclerosis. Lower lumbar spondylosis and degenerative disc disease. Prominent diffuse hepatic steatosis, worsened from previous.   Stacey Cox is here for follow-up of her metastatic hormone positive HER-2 negative breast cancer and re-evaluation of her drug tolerance. She is accompanied by several members of her family. She states that she is doing well overall. She states that she had some grade 1-2 diarrhea which is improving. No fevers/chills/abdominal pain. No blood  or mucous in the stools. Discussed dose reduction of Verzenio but patient notes that she would like to continue the current dose. Eating well. No other focal new symptoms. She is glad about the CT results.  She notes her family is planning to take a trip to [place] for the holidays.  On review of systems, pt reports some pain in the back, diarrhea and denies rashes, fever, chills, breast pain, and any other accompanying symptoms.   MEDICAL HISTORY:  Past Medical History:  Diagnosis Date  . Cancer [facility])    Metastatic Breast Cancer    SURGICAL HISTORY: Past Surgical History:  Procedure Laterality Date  . CHOLECYSTECTOMY    . TONSILLECTOMY      SOCIAL HISTORY: Social History   Social History  . Marital status: Single    Spouse name: N/A  . Number of children: N/A  . Years of education: N/A   Occupational History  . Not on file.   Social History Main Topics  . Smoking status: Former Smoker    Packs/day: 0.00    Types: Cigarettes    Quit date: 01/21/2017  . Smokeless tobacco: Never Used  . Alcohol use No  . Drug use: No  . Sexual activity: No   Other Topics Concern  . Not on file   Social History Narrative  . 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 Cancer Mother        breast  . Cancer Sister        breast  Mother with h/o breast cancer in her 27's Sister with breast cancer in her 60ys and later  was diagnosed with multiple myeloma. (Patient is not aware of any specific breast cancer mutations present)  ALLERGIES:  is allergic to thorazine [chlorpromazine] and ribociclib.  MEDICATIONS:  Current Outpatient Prescriptions  Medication Sig Dispense Refill  . abemaciclib (VERZENIO) 150 MG tablet Take 1 tablet (150 mg total) by mouth 2 (two) times daily. 60 tablet 0  . aspirin EC 81 MG tablet Take 1  tablet (81 mg total) by mouth daily.    . ergocalciferol (VITAMIN D2) 50000 units capsule Take one capsule by mouth three times weekly. 24 capsule 0  . escitalopram (LEXAPRO) 10 MG tablet TAKE 1 TABLET BY MOUTH DAILY 30 tablet 0  . HYDROcodone-acetaminophen (NORCO/VICODIN) 5-325 MG tablet Take 1-2 tablets by mouth every 4 (four) hours as needed for moderate pain or severe pain. 20 tablet 0  . ibuprofen (ADVIL,MOTRIN) 600 MG tablet Take 1 tablet (600 mg total) by mouth every 6 (six) hours as needed. 30 tablet 0  . letrozole (FEMARA) 2.5 MG tablet Take 1 tablet (2.5 mg total) by mouth daily. 30 tablet 6  . loperamide (IMODIUM) 1 MG/5ML solution Take 2 mg by mouth as needed for diarrhea or loose stools.     [name] LORazepam (ATIVAN) 0.5 MG tablet TAKE 1 TABLET BY MOUTH EVERY 6 HOURS AS NEEDED FOR ANXIETY 60 tablet 0  . ondansetron (ZOFRAN) 4 MG tablet Take 1 tablet (4 mg total) by mouth 2 (two) times daily. Take with Verzenio. 60 tablet 1   No current facility-administered medications for this visit.     REVIEW OF SYSTEMS:    10 Point review of Systems was done is negative except as noted above.  PHYSICAL EXAMINATION:  ECOG PERFORMANCE STATUS: 1 - Symptomatic but completely ambulatory  . Vitals:   09/14/17 1306  BP: (!) 141/92  Pulse: 74  Resp: 18  Temp: 98.9 F (37.2 C)  SpO2: 100%   Filed Weights   09/14/17 1306  Weight: 171 lb (77.6 kg)   .Body mass index is 28.46 kg/m.  GENERAL:alert, in no acute distress and comfortable SKIN: completely resolved exfoliative dermatitis. No recurrence on Vernezio EYES: conjunctiva are pink and non-injected, sclera anicteric OROPHARYNX: MMM, no exudates, no oropharyngeal erythema or ulceration NECK: supple, no JVD LYMPH:  no palpable lymphadenopathy in the cervical, axillary or inguinal regions LUNGS: clear to auscultation b/l with normal respiratory effort HEART: regular rate & rhythm ABDOMEN:  normoactive bowel sounds , non tender, not  distended. Extremity: no pedal edema PSYCH: alert & oriented x 3 with fluent speech NEURO: no focal motor/sensory deficits Breast examination not done today. Patient reports no breast pain or tenderness and no ulcerated areas.  LABORATORY DATA:  I have reviewed the data as listed  . CBC Latest Ref Rng & Units 09/05/2017 08/01/2017 07/05/2017  WBC 3.9 - 10.3 10e3/uL 4.4 5.0 4.8  Hemoglobin 11.6 - 15.9 g/dL 13.2 14.6 13.8  Hematocrit 34.8 - 46.6 % 39.8 43.8 42.2  Platelets 145 - 400 10e3/uL 222 227 198   . CBC    Component Value Date/Time   WBC 4.4 09/05/2017 1531   WBC 9.6 01/31/2017 1324   RBC 4.42 09/05/2017 1531   RBC 4.41 01/31/2017 1324   HGB 13.2 09/05/2017 1531   HCT 39.8 09/05/2017 1531   PLT 222 09/05/2017 1531   MCV 90.0 09/05/2017 1531   MCH 29.9 09/05/2017 1531   MCH 28.3 01/31/2017 1324   MCHC 33.2 09/05/2017 1531   MCHC 33.2 01/31/2017 1324   RDW 17.4 (H) 09/05/2017  1531   LYMPHSABS 1.2 09/05/2017 1531   MONOABS 0.2 09/05/2017 1531   EOSABS 0.1 09/05/2017 1531   BASOSABS 0.1 09/05/2017 1531    . CMP Latest Ref Rng & Units 09/05/2017 08/01/2017 07/05/2017  Glucose 70 - 140 mg/dl 103 94 122  BUN 7.0 - 26.0 mg/dL 12.3 13.1 12.6  Creatinine 0.6 - 1.1 mg/dL 1.2(H) 1.4(H) 1.2(H)  Sodium 136 - 145 mEq/L 141 139 142  Potassium 3.5 - 5.1 mEq/L 3.6 3.9 3.8  Chloride 101 - 111 mmol/L - - -  CO2 22 - 29 mEq/L 23 23 23   Calcium 8.4 - 10.4 mg/dL 9.5 10.0 9.8  Total Protein 6.4 - 8.3 g/dL 6.9 7.6 6.8  Total Bilirubin 0.20 - 1.20 mg/dL 0.43 0.34 0.36  Alkaline Phos 40 - 150 U/L 72 100 107  AST 5 - 34 U/L 25 23 18   ALT 0 - 55 U/L 41 33 24             RADIOGRAPHIC STUDIES: I have personally reviewed the radiological images as listed and agreed with the findings in the report. Ct Abdomen Pelvis Wo Contrast  Result Date: 09/12/2017 CLINICAL DATA:  Metastatic breast cancer. Response to neoadjuvant chemotherapy. Diarrhea. EXAM: CT CHEST, ABDOMEN AND PELVIS  WITHOUT CONTRAST TECHNIQUE: Multidetector CT imaging of the chest, abdomen and pelvis was performed following the standard protocol without IV contrast. COMPARISON:  CT examinations from 01/21/2017 FINDINGS: CT CHEST FINDINGS Cardiovascular: Coronary, aortic arch, and branch vessel atherosclerotic vascular disease. Mediastinum/Nodes: Significant improvement in adenopathy. Index left axillary lymph node approximately 5 mm 0.5 cm in short axis on image 16/2, formerly 1.4 cm. Prior left hilar prominence no longer apparent. Subcarinal node 0.7 cm in short axis, formerly 1.5 cm. Prior paratracheal lymph nodes have nearly resolved. Lungs/Pleura: The previous scattered pulmonary nodules have essentially resolved, with some very faint architectural distortion in some of the sites of prior nodularity but without significant residual discrete nodules. This represents a notable improvement. There is a small subpleural lymph node in the left lower lobe along the major fissure measuring 0.3 by 0.4 cm which is stable. Musculoskeletal: Scattered osseous metastatic disease in the ribs, thoracic spine, and right clavicle, compatible with progressive sclerosis which, given the dramatic improvement in soft tissue disease, is likely a response to treatment. Masslike architectural distortion in the right medial breast is reduced compared to prior. The medial subcutaneous breast nodule is also much less conspicuous. There is improved cutaneous thickening in the left breast. CT ABDOMEN PELVIS FINDINGS Hepatobiliary: Prominent and worsened diffuse hepatic steatosis. Prior cholecystectomy. Pancreas: Unremarkable Spleen: Below the spleen there is a rounded stable 2.2 cm structure measuring at 8 Hounsfield units, with splenic parenchyma at 39 Hounsfield units ; if this is a splenule it is unusual to have different density from the spleen. Adrenals/Urinary Tract: Unremarkable Stomach/Bowel: Wall thickening in much of the colon, colitis is not  excluded. Small bowel caliber normal. Sigmoid colon diverticulosis. Vascular/Lymphatic: Aortoiliac atherosclerotic vascular disease. No pathologic adenopathy identified. Reproductive: Unremarkable Other: Low-level mesenteric and presacral stranding. Musculoskeletal: Scattered sclerotic metastatic lesions including multiple lumbar levels, both iliac bones, the sacrum, the left posterior acetabular wall, and the right femoral neck, increased conspicuity compared to the prior exam. Spondylosis and degenerative disc disease causing impingement at L5-S1 and to a lesser extent at L4-5. IMPRESSION: 1. The pulmonary nodules have essentially resolved, and there is significant improvement in prior thoracic adenopathy. 2. Numerous scattered osseous metastatic lesions are now highly sclerotic. Given the pattern with  the soft tissue metastatic disease, I suspect that this represents a sclerotic healing response of pre-existing lesions rather than progressive disease. 3. Significant improvement in architectural distortion and cutaneous thickening in the left breast. 4. 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 This is close to the pancreatic tail but not tangential to the pancreatic tail. Surveillance suggested. 5. Diffuse wall thickening in the colon, potentially from low-grade colitis. There is also some low-level mesenteric and presacral edema which is nonspecific. 6. Other imaging findings of potential clinical significance: Aortic Atherosclerosis (ICD10-I70.0). Coronary atherosclerosis. Lower lumbar spondylosis and degenerative disc disease. Prominent diffuse hepatic steatosis, worsened from previous. Electronically Signed   By: Van Clines M.D.   On: 09/12/2017 15:29   Ct Chest Wo Contrast  Result Date: 09/12/2017 CLINICAL DATA:  Metastatic breast cancer.  Response to neoadjuvant chemotherapy. Diarrhea. EXAM: CT CHEST, ABDOMEN AND PELVIS WITHOUT CONTRAST TECHNIQUE: Multidetector CT imaging of the chest, abdomen and pelvis was performed following the standard protocol without IV contrast. COMPARISON:  CT examinations from 01/21/2017 FINDINGS: CT CHEST FINDINGS Cardiovascular: Coronary, aortic arch, and branch vessel atherosclerotic vascular disease. Mediastinum/Nodes: Significant improvement in adenopathy. Index left axillary lymph node approximately 5 mm 0.5 cm in short axis on image 16/2, formerly 1.4 cm. Prior left hilar prominence no longer apparent. Subcarinal node 0.7 cm in short axis, formerly 1.5 cm. Prior paratracheal lymph nodes have nearly resolved. Lungs/Pleura: The previous scattered pulmonary nodules have essentially resolved, with some very faint architectural distortion in some of the sites of prior nodularity but without significant residual discrete nodules. This represents a notable improvement. There is a small subpleural lymph node in the left lower lobe along the major fissure measuring 0.3 by 0.4 cm which is stable. Musculoskeletal: Scattered osseous metastatic disease in the ribs, thoracic spine, and right clavicle, compatible with progressive sclerosis which, given the dramatic improvement in soft tissue disease, is likely a response to treatment. Masslike architectural distortion in the right medial breast is reduced compared to prior. The medial subcutaneous breast nodule is also much less conspicuous. There is improved cutaneous thickening in the left breast. CT ABDOMEN PELVIS FINDINGS Hepatobiliary: Prominent and worsened diffuse hepatic steatosis. Prior cholecystectomy. Pancreas: Unremarkable Spleen: Below the spleen there is a rounded stable 2.2 cm structure measuring at 8 Hounsfield units, with splenic parenchyma at 39 Hounsfield units ; if this is a splenule it is unusual to have different density from the spleen. Adrenals/Urinary  Tract: Unremarkable Stomach/Bowel: Wall thickening in much of the colon, colitis is not excluded. Small bowel caliber normal. Sigmoid colon diverticulosis. Vascular/Lymphatic: Aortoiliac atherosclerotic vascular disease. No pathologic adenopathy identified. Reproductive: Unremarkable Other: Low-level mesenteric and presacral stranding. Musculoskeletal: Scattered sclerotic metastatic lesions including multiple lumbar levels, both iliac bones, the sacrum, the left posterior acetabular wall, and the right femoral neck, increased conspicuity compared to the prior exam. Spondylosis and degenerative disc disease causing impingement at L5-S1 and to a lesser extent at L4-5. IMPRESSION: 1. The pulmonary nodules have essentially resolved, and there is significant improvement in prior thoracic adenopathy. 2. Numerous scattered osseous metastatic lesions are now highly sclerotic. Given the pattern with the soft tissue metastatic disease, I suspect that this represents a sclerotic healing response of pre-existing lesions rather than progressive disease. 3. Significant improvement in architectural distortion and cutaneous thickening in the left breast. 4. Low the spleen there is  a stable rounded 2.2 cm structure with a lower density than the spleen itself, this could represent an unusual accessory spleen are possibly some type of duplication cyst. This is close to the pancreatic tail but not tangential to the pancreatic tail. Surveillance suggested. 5. Diffuse wall thickening in the colon, potentially from low-grade colitis. There is also some low-level mesenteric and presacral edema which is nonspecific. 6. Other imaging findings of potential clinical significance: Aortic Atherosclerosis (ICD10-I70.0). Coronary atherosclerosis. Lower lumbar spondylosis and degenerative disc disease. Prominent diffuse hepatic steatosis, worsened from previous. Electronically Signed   By: Van Clines M.D.   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 Multifocal tumor in the left breast with biopsy-proven left axillary metastases. Noted to have extensive bone metastases and pulmonary metastases. Patient is noted to have calvarial metastases but no overt parenchymal metastasis She has been noted to have good clinical response and her tumor marker levels of progressively normalized.  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 Much improved back pain.  # 3 Neutropenia Related to her Ribociclib - resolved. Patient is currently on Verzenio and has not developed any neutropenia. This is being monitored.  #4 Increased ALT about 2X ULN - likely from Ribociclib - now resolved. Monitoring with Verzenio.  #5 Cancer related pain - controlled - status post palliative RT to spine - no currently needing any pain medications.  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 No other new medication. Now resolved. Monitoring on increasing doses of Verzenio. No issues with recurrent rash on Verzenio '150mg'$  po BID  #7 Grade 1 diarrhea related to her Verzenio. She notes it is controlled with Imodium when necessary. We discussed that we would need to hold the medication noted in the dose of the diarrhea worsens. Patient is hesitant to reduce the dose of this point and notes that she will monitor it.  #8 slight increase in creatinine to 1.4. Likely from the [place]. Monitor. Patient recommended to stay well-hydrated. Plan -labs stable -CT Chest/ad/pelvis  Results discussed in details - patient glad with good response. Patient appears to be in good spirits and less anxious than previously. -patients rash has not recurred on Verzenio '150mg'$  po BID and she will continue the same.. -Grade 1 diarrhea related to her Verzenio. She notes it is controlled with Imodium when  necessary and she does not feel dose reduction to help symptoms is necessary at this time. -she will continue daily letrozole 2.5 mg. -Continue on Xgeva every 4 weeks. -Outpatient cancer rehabilitation referral given for strengthening endurance training and management of fatigue.    PLAN -Continue Xgeva q4 weeks -Continue same dose of Ergocalciferol 3x weekly -Receiving prevnar vaccine today  -Pneumococcal conjugate vaccine next appointment Did receive flu shot at work already.  RTC with Dr Irene Limbo in 11/2016 with with labs and tumor markers    #6 vitamin D deficiency up from 14 to 21.6 to 22.6 to 26.5 as of 09/05/2017. -Recheck levels in follow-up in 6 weeks Continue ergocalciferol at current dose   All of the patients questions were answered with apparent satisfaction. The patient knows to call the clinic with any problems, questions or concerns.  I spent 20 minutes counseling the patient face to face. The total time spent in the appointment was 30 minutes and more than 50% was on counseling and  direct patient cares.    Stacey Lone MD [place] AAHIVMS [facility] Hematology/Oncology Physician [facility]  (Office):       [phone] (Work cell):  [phone] (Fax):           [phone]  This document serves as a record of services personally performed by Stacey Lone, MD. It was created on her behalf by Alean Rinne, a trained medical scribe. The creation of this record is based on the scribe's personal observations and the provider's statements to them. This document has been checked and approved by the attending provider.

## 2017-09-19 ENCOUNTER — Encounter: Payer: Self-pay | Admitting: Physical Therapy

## 2017-09-19 ENCOUNTER — Telehealth: Payer: Self-pay | Admitting: Hematology

## 2017-09-19 ENCOUNTER — Ambulatory Visit: Payer: BC Managed Care – PPO | Admitting: Physical Therapy

## 2017-09-19 DIAGNOSIS — M545 Low back pain, unspecified: Secondary | ICD-10-CM

## 2017-09-19 DIAGNOSIS — R29898 Other symptoms and signs involving the musculoskeletal system: Secondary | ICD-10-CM

## 2017-09-19 DIAGNOSIS — R2689 Other abnormalities of gait and mobility: Secondary | ICD-10-CM

## 2017-09-19 NOTE — Telephone Encounter (Signed)
Scheduled appt per 10/24 los - patient is aware of appt date and time.

## 2017-09-19 NOTE — Therapy (Signed)
[place], [place], [address] Phone: [phone]   Fax:  [phone]  Physical Therapy Treatment  Patient Details  Name: Stacey Cox MRN: [id number] Date of Birth: 06/03/1956 Referring Provider: Dr. Sullivan Lone  Encounter Date: 09/19/2017      PT End of Session - 09/19/17 1425    Visit Number 5   Number of Visits 13   Date for PT Re-Evaluation 10/17/17   PT Start Time 1350   PT Stop Time 1429   PT Time Calculation (min) 39 min   Activity Tolerance Patient tolerated treatment well   Behavior During Therapy [facility] for tasks assessed/performed      Past Medical History:  Diagnosis Date  . Cancer [facility])    Metastatic Breast Cancer    Past Surgical History:  Procedure Laterality Date  . CHOLECYSTECTOMY    . TONSILLECTOMY      There were no vitals filed for this visit.      Subjective Assessment - 09/19/17 1352    Subjective I feel okay today. I feel like I am doing okay in therapy. I do not feel like I am ready for discharge.    Pertinent History Back pain led to diagnosis of left breast cancer 01/20/17. 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 Multifocal tumor in the left breast with biopsy-proven left axillary metastases and metastases in bones and lung.  Being treated with two medications.  Had radiation to her low back, 10 treatments, back in March, which helped with the pain. No other health issues.   Patient Stated Goals She is not sure what to expect.   Currently in Pain? No/denies   Pain Score 0-No pain                  [place] - 09/19/17 0001    Open a tight or new jar Moderate difficulty   Do heavy household chores (wash walls, wash floors) Mild difficulty   Carry a shopping bag or briefcase No difficulty   Wash your back No difficulty   Use a knife to cut food No difficulty   Recreational activities in which you take some force or  impact through your arm, shoulder, or hand (golf, hammering, tennis) No difficulty   During the past week, to what extent has your arm, shoulder or hand problem interfered with your normal social activities with family, friends, neighbors, or groups? Not at all   During the past week, to what extent has your arm, shoulder or hand problem limited your work or other regular daily activities Not at all   Arm, shoulder, or hand pain. None   Tingling (pins and needles) in your arm, shoulder, or hand None   Difficulty Sleeping No difficulty   DASH Score 6.82 %               [facility] Adult PT Treatment/Exercise - 09/19/17 0001      Balance Poses: Yoga   Warrior I 2 reps;15 seconds  2 reps with each leg forward   Tree Pose 15 seconds;1 rep  1 rep with each leg supporting     Neuro Re-ed    Neuro Re-ed Details  standing on BOSU without use of hands x 15 sec x 2 before needing to use hands for balance, then marching on BOSU x 10 reps each with occasional HHA     Lumbar Exercises: Aerobic   Tread Mill 6 minutes at 2.0, 0% grade with no  difficutly      Lumbar Exercises: Standing   Other Standing Lumbar Exercises stand holding physioball out front, do slight squat, then straighten up, bringing arms overhead and tightening abdominal muscles x 8  c/o mild dizziness     Lumbar Exercises: Supine   Bridge 10 reps;1 second   Straight Leg Raise 10 reps;1 second   Straight Leg Raises Limitations with each leg, core stabalized, then tighten, lift, lower , relax    Other Supine Lumbar Exercises pelvic tilts with heel slides x 10 reps each, with clamshells x 10     Lumbar Exercises: Quadruped   Opposite Arm/Leg Raise Right arm/Left leg;Left arm/Right leg;5 reps                        Long Term Clinic Goals - 09/19/17 1354      CC Long Term Goal  #1   Title Pt. will be able to perform a home exercise program for endurance and strengthening that does not aggravate low back pain.    Baseline 09/19/17- pt states she has an HEP that does not cause low back pain   Time 4   Period Weeks   Status Achieved     CC Long Term Goal  #2   Title Pt. will be knowledgeable about how to progress independently with her exercise program.   Time 4   Period Weeks   Status On-going     CC Long Term Goal  #3   Title Pt. will be knowledgeable about community exercise program options or how to continue to exercise on her own after discharge.   Time 4   Period Weeks   Status On-going     CC Long Term Goal  #4   Title Pt. will increase 30 second sit to stand to 15 reps or more.   Baseline 12 reps at eval, 09/19/17- 12 reps but pt states she has bad knees and pt has crepitus   Time 4   Period Weeks   Status On-going     CC Long Term Goal  #5   Title Reduce quick DASH score to 10 or less.   Baseline 20.45 at eval, 09/19/17- 6.82   Time 4   Period Weeks   Status Achieved            Plan - 09/19/17 1426    Clinical Impression Statement Assessed pt's progress towards goals in therapy. Pt is progressing towards all of her goals but has not shown an increase in number of sit to stands in 30 sec possibly due to her bad knees. Pt was educated today on community exercise programs and plans to follow up tomorrow about the [facility] program. She was also given a calendar for exercises at the cancer center. Pt would benefit from continued skilled PT services to continue to progress towards goals and assist pt with transition to community exercise program.    Rehab Potential Good   Clinical Impairments Affecting Rehab Potential metastases to bone and lungs; knee pain aggravated by certain activities   PT Frequency 1x / week   PT Duration 4 weeks   PT Treatment/Interventions ADLs/Self Care Home Management;Aquatic Therapy;Scientist, product/process development;Therapeutic exercise;Patient/family education;Manual techniques;Neuromuscular re-education   PT  Next Visit Plan ask pt is she contacted [facility] about [employer], continue standing balance exercises; continue exercise program and HEP instruction with active exercises and body weight resistance; possibly include recumbent bike or NuStep (watch knee pain) or treadmill  as pt chose 2nd visit; avoid leg press and other back compressive resistance   PT Home Exercise Plan pre-Pilates core exercises   Consulted and Agree with Plan of Care Patient      Patient will benefit from skilled therapeutic intervention in order to improve the following deficits and impairments:  Decreased activity tolerance, Other (comment)  Visit Diagnosis: Other abnormalities of gait and mobility - Plan: PT plan of care cert/re-cert  Low back pain associated with a spinal disorder other than radiculopathy or spinal stenosis - Plan: PT plan of care cert/re-cert  Other symptoms and signs involving the musculoskeletal system - Plan: PT plan of care cert/re-cert     Problem List Patient Active Problem List   Diagnosis Date Noted  . Genetic testing 08/08/2017  . Palliative care status   . Counseling regarding advanced care planning and goals of care 03/25/2017  . 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 Nausea without vomiting 03/21/2017  . Bone metastases ([place]) 02/01/2017  . 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 Malignant neoplasm metastatic to lung ([place])   . Cancer associated pain   . Metastatic breast cancer ([place]) 01/21/2017  . Pathologic fracture   . Breast mass, left     Stacey Cox [facility] 09/19/2017, 2:32 PM  [place], [place], [address] Phone: [phone]   Fax:  [phone]  Name: Stacey Cox MRN: [id number] Date of Birth: [birth year]  Stacey Cox, PT 09/19/17 2:33 PM

## 2017-09-23 ENCOUNTER — Other Ambulatory Visit: Payer: Self-pay | Admitting: Hematology

## 2017-09-27 ENCOUNTER — Encounter: Payer: Self-pay | Admitting: Physical Therapy

## 2017-09-27 ENCOUNTER — Ambulatory Visit: Payer: BC Managed Care – PPO | Attending: Hematology | Admitting: Physical Therapy

## 2017-09-27 DIAGNOSIS — R2689 Other abnormalities of gait and mobility: Secondary | ICD-10-CM | POA: Diagnosis present

## 2017-09-27 DIAGNOSIS — M545 Low back pain, unspecified: Secondary | ICD-10-CM

## 2017-09-27 DIAGNOSIS — R29898 Other symptoms and signs involving the musculoskeletal system: Secondary | ICD-10-CM | POA: Insufficient documentation

## 2017-09-27 NOTE — Patient Instructions (Signed)
Pelvic tilt Tighten abdomen, relax 50% hold it, then one knee to table top, then down,  Same as above, bring one arm up, then the other to join it then lower both slowly Same as above, bicycle     Lying on side, clamshell, fire hydrant, then straight leg raise  On hands and knees, raise up opposite arm and leg  4 part straight leg raise, squeeze, lift, lower, relax  Up to 15 reps    Theraband

## 2017-09-27 NOTE — Therapy (Signed)
[place], [place], [address] Phone: [phone]   Fax:  [phone]  Physical Therapy Treatment  Patient Details  Name: Stacey Cox MRN: [id number] Date of Birth: 06-20-1956 Referring Provider: Dr. Sullivan Lone   Encounter Date: 09/27/2017  PT End of Session - 09/27/17 1752    Visit Number  6    Number of Visits  13    Date for PT Re-Evaluation  10/17/17    PT Start Time  1440    PT Stop Time  1520    PT Time Calculation (min)  40 min    Activity Tolerance  Patient tolerated treatment well    Behavior During Therapy  [facility] for tasks assessed/performed       Past Medical History:  Diagnosis Date  . Cancer [facility])    Metastatic Breast Cancer    Past Surgical History:  Procedure Laterality Date  . CHOLECYSTECTOMY    . TONSILLECTOMY      There were no vitals filed for this visit.  Subjective Assessment - 09/27/17 1440    Subjective  Pt states she is doing okay.  She will be looking at doing the [facility] program starting in 2019 She is doing some walking, but not every day     Pertinent History  Back pain led to diagnosis of left breast cancer 01/20/17. 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 Multifocal tumor in the left breast with biopsy-proven left axillary metastases and metastases in bones and lung.  Being treated with two medications.  Had radiation to her low back, 10 treatments, back in March, which helped with the pain. No other health issues.    Patient Stated Goals  She is not sure what to expect.    Currently in Pain?  No/denies                      [facility] Adult PT Treatment/Exercise - 09/27/17 0001      High Level Balance   High Level Balance Comments  sandin on BOSU in treadmill  for weight shifts, arm ROM and bicep curls with 1# weight       Balance Poses: Yoga   Warrior I  2 reps;15 seconds cues for deeper front lunge and align  shoulder and hips   cues for deeper front lunge and align shoulder and hips     Self-Care   Self-Care  Other Self-Care Comments    Other Self-Care Comments   gave pt information about group Pilates class held on the ortho side       Lumbar Exercises: Supine   Dead Bug  5 reps    Straight Leg Raise  10 reps tighten, lift, lower, relax    tighten, lift, lower, relax    Other Supine Lumbar Exercises  one knee to tabletop and down, then one up, other up, both down, bicycle x 10 reps       Lumbar Exercises: Sidelying   Other Sidelying Lumbar Exercises  clams, fire hydrant, straight leg abduction 10 reps on each leg       Lumbar Exercises: Quadruped   Opposite Arm/Leg Raise  Right arm/Left leg;Left arm/Right leg;5 reps                     Long Term Clinic Goals - 09/19/17 1354      CC Long Term Goal  #1   Title  Pt. will be able to perform a home  exercise program for endurance and strengthening that does not aggravate low back pain.    Baseline  09/19/17- pt states she has an HEP that does not cause low back pain    Time  4    Period  Weeks    Status  Achieved      CC Long Term Goal  #2   Title  Pt. will be knowledgeable about how to progress independently with her exercise program.    Time  4    Period  Weeks    Status  On-going      CC Long Term Goal  #3   Title  Pt. will be knowledgeable about community exercise program options or how to continue to exercise on her own after discharge.    Time  4    Period  Weeks    Status  On-going      CC Long Term Goal  #4   Title  Pt. will increase 30 second sit to stand to 15 reps or more.    Baseline  12 reps at eval, 09/19/17- 12 reps but pt states she has bad knees and pt has crepitus    Time  4    Period  Weeks    Status  On-going      CC Long Term Goal  #5   Title  Reduce quick DASH score to 10 or less.    Baseline  20.45 at eval, 09/19/17- 6.82    Time  4    Period  Weeks    Status  Achieved          Plan - 09/27/17 1752    Clinical Impression Statement  Pt is doing very well with exercise and seemed to enjoy more core work.  She will do [employer] in January but may be interested in doing more Pilates, though pt will need to be more cautious about twisting     PT Next Visit Plan  continue with core work on the mat including straight leg raise and balance with BOSU ball and Airex.  Remind pt to avoid twisting motions if she decides to take  Pilates class. consider adding treadmill        Patient will benefit from skilled therapeutic intervention in order to improve the following deficits and impairments:     Visit Diagnosis: Other abnormalities of gait and mobility  Low back pain associated with a spinal disorder other than radiculopathy or spinal stenosis  Other symptoms and signs involving the musculoskeletal system     Problem List Patient Active Problem List   Diagnosis Date Noted  . Genetic testing 08/08/2017  . Palliative care status   . Counseling regarding advanced care planning and goals of care 03/25/2017  . 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 Nausea without vomiting 03/21/2017  . Bone metastases ([place]) 02/01/2017  . 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 Malignant neoplasm metastatic to lung ([place])   . Cancer associated pain   . Metastatic breast cancer ([place]) 01/21/2017  . Pathologic fracture   . Breast mass, left    Donato Heinz. Owens Shark PT  Norwood Levo 09/27/2017, 5:56 PM  [place], [place], [address] Phone: [phone]   Fax:  [phone]  Name: Stacey Cox MRN: [id number] Date of Birth: 07/26/1956

## 2017-10-02 MED FILL — VERZENIO 150 MG TAB: 150 | 28 days supply | Qty: 56 | Fill #0

## 2017-10-04 ENCOUNTER — Ambulatory Visit: Payer: BC Managed Care – PPO | Admitting: Physical Therapy

## 2017-10-04 ENCOUNTER — Encounter: Payer: Self-pay | Admitting: Physical Therapy

## 2017-10-04 DIAGNOSIS — R2689 Other abnormalities of gait and mobility: Secondary | ICD-10-CM

## 2017-10-04 DIAGNOSIS — R29898 Other symptoms and signs involving the musculoskeletal system: Secondary | ICD-10-CM

## 2017-10-04 DIAGNOSIS — M545 Low back pain, unspecified: Secondary | ICD-10-CM

## 2017-10-04 NOTE — Therapy (Signed)
[place], [place], [address] Phone: [phone]   Fax:  [phone]  Physical Therapy Treatment  Patient Details  Name: Stacey Cox MRN: [id number] Date of Birth: 09-05-1956 Referring Provider: Dr. Sullivan Lone   Encounter Date: 10/04/2017  PT End of Session - 10/04/17 1730    Visit Number  7    Number of Visits  13    Date for PT Re-Evaluation  10/17/17    PT Start Time  1430    PT Stop Time  1515    PT Time Calculation (min)  45 min    Activity Tolerance  Patient tolerated treatment well    Behavior During Therapy  [facility] for tasks assessed/performed       Past Medical History:  Diagnosis Date  . Cancer [facility])    Metastatic Breast Cancer    Past Surgical History:  Procedure Laterality Date  . CHOLECYSTECTOMY    . TONSILLECTOMY      There were no vitals filed for this visit.  Subjective Assessment - 10/04/17 1443    Subjective  Pt states she had been having trouble with some nausea but she is feeling better with hydration She is going to [place] to be with family for several weeks. She plans to do [employer] and Pilates class with [place] Cox in January     Pertinent History  Back pain led to diagnosis of left breast cancer 01/20/17. 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 Multifocal tumor in the left breast with biopsy-proven left axillary metastases and metastases in bones and lung.  Being treated with two medications.  Had radiation to her low back, 10 treatments, back in March, which helped with the pain. No other health issues.    Patient Stated Goals  She is not sure what to expect.    Currently in Pain?  No/denies                      [facility] Adult PT Treatment/Exercise - 10/04/17 0001      High Level Balance   High Level Balance Comments  inside treadmill for //bar support on BOSU with round side up for several minutes for weight shift  balance training, Then with round side down for standing on flat surface for constand readjustment       Balance Poses: Yoga   Warrior I  2 reps;45 seconds hands on waist to prevent back pain today       Lumbar Exercises: Aerobic   Tread Mill  6 minutes at 2.0, 0% grade with no difficutly       Knee/Hip Exercises: Standing   Hip Abduction  Stengthening;Right;Left;Knee straight    Abduction Limitations  walking with red theraband around thighs x 25 feet     Forward Step Up  Right;Left;5 reps;Step Height: 6"    Other Standing Knee Exercises  walking on toes, then on heel x 25'                     [place] [facility] Goals - 09/19/17 1354      CC Long Term Goal  #1   Title  Pt. will be able to perform a home exercise program for endurance and strengthening that does not aggravate low back pain.    Baseline  09/19/17- pt states she has an HEP that does not cause low back pain    Time  4    Period  Weeks    Status  Achieved      CC Long Term Goal  #2   Title  Pt. will be knowledgeable about how to progress independently with her exercise program.    Time  4    Period  Weeks    Status  On-going      CC Long Term Goal  #3   Title  Pt. will be knowledgeable about community exercise program options or how to continue to exercise on her own after discharge.    Time  4    Period  Weeks    Status  On-going      CC Long Term Goal  #4   Title  Pt. will increase 30 second sit to stand to 15 reps or more.    Baseline  12 reps at eval, 09/19/17- 12 reps but pt states she has bad knees and pt has crepitus    Time  4    Period  Weeks    Status  On-going      CC Long Term Goal  #5   Title  Reduce quick DASH score to 10 or less.    Baseline  20.45 at eval, 09/19/17- 6.82    Time  4    Period  Weeks    Status  Achieved         Plan - 10/04/17 1731    Clinical Impression Statement  Pt continues to do well with high level balance activites.  Reminded her to avoid twisting  in Pilates and she acknowledged.  She will pursue this in January along with [employer].  Pt needs continued LE and core strengthening     Clinical Impairments Affecting Rehab Potential  metastases to bone and lungs; knee pain aggravated by certain activities    PT Next Visit Plan  Reassess for discharge or renewal. continue with core work on the mat including straight leg raise and balance with BOSU ball and Airex.  Remind pt to avoid twisting motions if she decides to take  Pilates class. consider increaseing  treadmill        Patient will benefit from skilled therapeutic intervention in order to improve the following deficits and impairments:  Decreased activity tolerance, Other (comment)  Visit Diagnosis: Other abnormalities of gait and mobility  Low back pain associated with a spinal disorder other than radiculopathy or spinal stenosis  Other symptoms and signs involving the musculoskeletal system     Problem List Patient Active Problem List   Diagnosis Date Noted  . Genetic testing 08/08/2017  . Palliative care status   . Counseling regarding advanced care planning and goals of care 03/25/2017  . 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 Nausea without vomiting 03/21/2017  . Bone metastases ([place]) 02/01/2017  . 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 Malignant neoplasm metastatic to lung ([place])   . Cancer associated pain   . Metastatic breast cancer ([place]) 01/21/2017  . Pathologic fracture   . Breast mass, left    Stacey Cox PT  Stacey Cox 10/04/2017, 5:37 PM  [place], [place], [address] Phone: [phone]   Fax:  [phone]  Name: Stacey Cox MRN: [id number] Date of Birth: 1956-08-23

## 2017-10-06 ENCOUNTER — Other Ambulatory Visit (HOSPITAL_BASED_OUTPATIENT_CLINIC_OR_DEPARTMENT_OTHER): Payer: BC Managed Care – PPO

## 2017-10-06 ENCOUNTER — Ambulatory Visit (HOSPITAL_BASED_OUTPATIENT_CLINIC_OR_DEPARTMENT_OTHER): Payer: BC Managed Care – PPO

## 2017-10-06 VITALS — BP 114/81 | HR 63 | Temp 97.0°F | Resp 16

## 2017-10-06 DIAGNOSIS — C7951 Secondary malignant neoplasm of bone: Secondary | ICD-10-CM | POA: Diagnosis not present

## 2017-10-06 DIAGNOSIS — C50812 Malignant neoplasm of overlapping sites of left female breast: Secondary | ICD-10-CM | POA: Diagnosis not present

## 2017-10-06 DIAGNOSIS — C50912 Malignant neoplasm of unspecified site of left female breast: Secondary | ICD-10-CM

## 2017-10-06 DIAGNOSIS — Z7189 Other specified counseling: Secondary | ICD-10-CM

## 2017-10-06 LAB — COMPREHENSIVE METABOLIC PANEL
ALBUMIN: 3.6 g/dL (ref 3.5–5.0)
ALT: 33 U/L (ref 0–55)
ANION GAP: 9 meq/L (ref 3–11)
AST: 25 U/L (ref 5–34)
Alkaline Phosphatase: 69 U/L (ref 40–150)
BUN: 10.2 mg/dL (ref 7.0–26.0)
CALCIUM: 8.9 mg/dL (ref 8.4–10.4)
CHLORIDE: 107 meq/L (ref 98–109)
CO2: 24 mEq/L (ref 22–29)
CREATININE: 1.2 mg/dL — AB (ref 0.6–1.1)
EGFR: 59 mL/min/{1.73_m2} — ABNORMAL LOW (ref >60)
Glucose: 102 mg/dl (ref 70–140)
POTASSIUM: 3.6 meq/L (ref 3.5–5.1)
Sodium: 140 mEq/L (ref 136–145)
TOTAL PROTEIN: 6.6 g/dL (ref 6.4–8.3)
Total Bilirubin: 0.57 mg/dL (ref 0.20–1.20)

## 2017-10-06 MED ORDER — DENOSUMAB 120 MG/1.7ML ~~LOC~~ SOLN
120.0000 mg | Freq: Once | SUBCUTANEOUS | Status: AC
  Administered 2017-10-06: 120 mg via SUBCUTANEOUS
  Filled 2017-10-06: qty 1.7

## 2017-10-06 NOTE — Patient Instructions (Signed)

## 2017-10-07 ENCOUNTER — Other Ambulatory Visit: Payer: Self-pay | Admitting: Hematology

## 2017-10-09 ENCOUNTER — Other Ambulatory Visit: Payer: Self-pay | Admitting: Hematology

## 2017-10-09 DIAGNOSIS — C50919 Malignant neoplasm of unspecified site of unspecified female breast: Secondary | ICD-10-CM

## 2017-10-10 ENCOUNTER — Other Ambulatory Visit: Payer: Self-pay

## 2017-10-10 ENCOUNTER — Telehealth: Payer: Self-pay

## 2017-10-10 DIAGNOSIS — C50919 Malignant neoplasm of unspecified site of unspecified female breast: Secondary | ICD-10-CM

## 2017-10-10 MED ORDER — ERGOCALCIFEROL 1.25 MG (50000 UT) PO CAPS: ORAL_CAPSULE | ORAL | 3 refills | Status: DC

## 2017-10-10 MED ORDER — ESCITALOPRAM OXALATE 10 MG PO TABS: 10.0000 mg | ORAL_TABLET | Freq: Every day | ORAL | 0 refills | Status: DC

## 2017-10-10 MED ORDER — ONDANSETRON HCL 4 MG PO TABS: 4.0000 mg | ORAL_TABLET | Freq: Two times a day (BID) | ORAL | 3 refills | Status: DC

## 2017-10-10 NOTE — Telephone Encounter (Signed)
Called patient to confirm requested medications for refill. Pt desired zofran, vitamin D2, and lexapro to

## 2017-10-12 ENCOUNTER — Ambulatory Visit: Payer: BC Managed Care – PPO

## 2017-10-15 ENCOUNTER — Other Ambulatory Visit: Payer: Self-pay | Admitting: Hematology

## 2017-10-18 ENCOUNTER — Encounter: Payer: BC Managed Care – PPO | Admitting: Physical Therapy

## 2017-10-19 ENCOUNTER — Other Ambulatory Visit: Payer: Self-pay | Admitting: *Deleted

## 2017-10-19 MED ORDER — LORAZEPAM 0.5 MG PO TABS: 0.5000 mg | ORAL_TABLET | Freq: Four times a day (QID) | ORAL | 0 refills | Status: DC | PRN

## 2017-10-24 ENCOUNTER — Other Ambulatory Visit: Payer: Self-pay

## 2017-10-24 ENCOUNTER — Other Ambulatory Visit: Payer: Self-pay | Admitting: Hematology

## 2017-10-24 MED ORDER — ABEMACICLIB 150 MG PO TABS: ORAL_TABLET | ORAL | 0 refills | Status: DC

## 2017-10-25 ENCOUNTER — Encounter: Payer: BC Managed Care – PPO | Admitting: Physical Therapy

## 2017-11-02 MED FILL — VERZENIO 150 MG TAB: 150 | 28 days supply | Qty: 56 | Fill #0

## 2017-11-06 ENCOUNTER — Other Ambulatory Visit: Payer: Self-pay | Admitting: Hematology

## 2017-11-07 ENCOUNTER — Telehealth: Payer: Self-pay

## 2017-11-07 ENCOUNTER — Ambulatory Visit: Payer: BC Managed Care – PPO | Attending: Hematology | Admitting: Physical Therapy

## 2017-11-07 ENCOUNTER — Other Ambulatory Visit: Payer: Self-pay

## 2017-11-07 DIAGNOSIS — R2689 Other abnormalities of gait and mobility: Secondary | ICD-10-CM

## 2017-11-07 DIAGNOSIS — M545 Low back pain, unspecified: Secondary | ICD-10-CM

## 2017-11-07 DIAGNOSIS — R29898 Other symptoms and signs involving the musculoskeletal system: Secondary | ICD-10-CM | POA: Diagnosis present

## 2017-11-07 DIAGNOSIS — C50919 Malignant neoplasm of unspecified site of unspecified female breast: Secondary | ICD-10-CM

## 2017-11-07 MED ORDER — ESCITALOPRAM OXALATE 10 MG PO TABS: 10.0000 mg | ORAL_TABLET | Freq: Every day | ORAL | 0 refills | Status: DC

## 2017-11-07 MED ORDER — ONDANSETRON HCL 4 MG PO TABS: 4.0000 mg | ORAL_TABLET | Freq: Two times a day (BID) | ORAL | 2 refills | Status: DC

## 2017-11-07 NOTE — Therapy (Addendum)
[place], [place], [address] Phone: [phone]   Fax:  [phone]  Physical Therapy Treatment  Patient Details  Name: Stacey Cox MRN: [id number] Date of Birth: 01-02-1956 Referring Provider: Dr. Sullivan Lone   Encounter Date: 11/07/2017  PT End of Session - 11/07/17 1718    Visit Number  8    Number of Visits  13    PT Start Time  3532    PT Stop Time  1515    PT Time Calculation (min)  40 min    Activity Tolerance  Patient tolerated treatment well    Behavior During Therapy  [facility] for tasks assessed/performed       Past Medical History:  Diagnosis Date  . Cancer [facility])    Metastatic Breast Cancer    Past Surgical History:  Procedure Laterality Date  . CHOLECYSTECTOMY    . TONSILLECTOMY      There were no vitals filed for this visit.  Subjective Assessment - 11/07/17 1436    Subjective  "Just tired." Feels she has what she needs from therapy and can continue on her own; plans to try one or two community programs. My back is starting to hurt again, so I need to consistently do the exercises every day.    Pertinent History  Back pain led to diagnosis of left breast cancer 01/20/17. 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 Multifocal tumor in the left breast with biopsy-proven left axillary metastases and metastases in bones and lung.  Being treated with two medications.  Had radiation to her low back, 10 treatments, back in March, which helped with the pain. No other health issues.    Currently in Pain?  No/denies                      [facility] Adult PT Treatment/Exercise - 11/07/17 0001      Neuro Re-ed    Neuro Re-ed Details   stand on BOSU ball on treadmill for arm support, dome side up:  work on balance without holding on, then weight shift left to right, then mini squats x 10, then briefly tried with eyes closed.  Then with dome side down:   stable balance with eyes open, then mini squats x10. Standing on [employer] foam with feet close to together, then feet in tandem each way; then on floor, single leg stance on each leg x 40 seconds; these done in corner.      Lumbar Exercises: Seated   Other Seated Lumbar Exercises  sitting on physioball: alternate arm raises, small marches, leg extensions alternating, opposite arm raise with leg extension; then small bounces; then pelvic tilts forward-back, side to side, and in circular motions             PT Education - 11/07/17 1717    Education provided  Yes    Education Details  about balance exercise standing on a foam pad in a corner, work on tandem stance and feet close together    [employer]) Educated  Patient    Methods  Explanation;Demonstration;Verbal cues    Comprehension  Verbalized understanding;Returned demonstration             [facility] Goals - 11/07/17 1437      CC Long Term Goal  #1   Title  Pt. will be able to perform a home exercise program for endurance and strengthening that does not aggravate low back pain.  Status  Achieved      CC Long Term Goal  #2   Title  Pt. will be knowledgeable about how to progress independently with her exercise program.    Status  Achieved      CC Long Term Goal  #3   Title  Pt. will be knowledgeable about community exercise program options or how to continue to exercise on her own after discharge.    Baseline  [facility] [employer] [facility] and/or will do the Pilates class.      CC Long Term Goal  #4   Title  Pt. will increase 30 second sit to stand to 15 reps or more.    Baseline  12 reps at eval, 09/19/17- 12 reps but pt states she has bad knees and pt has crepitus    Status  Deferred      CC Long Term Goal  #5   Title  Reduce quick DASH score to 10 or less.    Status  Achieved         Plan - 11/07/17 1718    Clinical Impression Statement  Pt. feel ready for discharge and she has met most  goals. She met with the Pilates therapist at end of session today and is signed up for Pilates class starting in January. She was given a new balance exercise for HEP.    Rehab Potential  Good    Clinical Impairments Affecting Rehab Potential  metastases to bone and lungs; knee pain aggravated by certain activities    PT Treatment/Interventions  ADLs/Self Care Home Management;Aquatic Therapy;Scientist, product/process development;Therapeutic exercise;Patient/family education;Manual techniques;Neuromuscular re-education    PT Next Visit Plan  None; discharge today.    PT Home Exercise Plan  pre-Pilates core exercises    Consulted and Agree with Plan of Care  Patient       Patient will benefit from skilled therapeutic intervention in order to improve the following deficits and impairments:  Decreased activity tolerance, Other (comment)  Visit Diagnosis: Other abnormalities of gait and mobility  Low back pain associated with a spinal disorder other than radiculopathy or spinal stenosis  Other symptoms and signs involving the musculoskeletal system     Problem List Patient Active Problem List   Diagnosis Date Noted  . Genetic testing 08/08/2017  . Palliative care status   . Counseling regarding advanced care planning and goals of care 03/25/2017  . 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 Nausea without vomiting 03/21/2017  . Bone metastases ([place]) 02/01/2017  . 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 Malignant neoplasm metastatic to lung ([place])   . Cancer associated pain   . Metastatic breast cancer ([place]) 01/21/2017  . Pathologic fracture   . Breast mass, left     SALISBURY,DONNA 11/07/2017, 5:32 PM  [place], [place], [address] Phone: [phone]   Fax:  [phone]  Name: Stacey Cox MRN: [id number] Date of Birth: [birth year]  PHYSICAL THERAPY DISCHARGE  SUMMARY  Visits from Start of Care: 8  Current functional level related to goals / functional outcomes: Goals met as noted above.   Remaining deficits: Still with balance challenges and will benefit from ongoing core exercise to help her back issues.   Education / Equipment: Pre-Pilates core exercises, balance exercises. Plan: Patient agrees to discharge.  Patient goals were met. Patient is being discharged due to meeting the stated rehab goals.  ?????   She plans to continue exercise  programs outside of therapy.  Serafina Royals, PT 11/07/17 5:32 PM

## 2017-11-07 NOTE — Telephone Encounter (Signed)
Received request from pt pharmacy to refill lexapro prescription. Called pt to confirm pharmacy desired as the last to be used was in [place]. Recalled that pt had been on a vacation at the time. Pt confirmed that she is back in town and would like to use [facility] on [address]. Pt additionally requested refill of zofran prescription.

## 2017-11-09 ENCOUNTER — Ambulatory Visit (HOSPITAL_BASED_OUTPATIENT_CLINIC_OR_DEPARTMENT_OTHER): Payer: BC Managed Care – PPO

## 2017-11-09 ENCOUNTER — Other Ambulatory Visit: Payer: Self-pay

## 2017-11-09 ENCOUNTER — Other Ambulatory Visit (HOSPITAL_BASED_OUTPATIENT_CLINIC_OR_DEPARTMENT_OTHER): Payer: BC Managed Care – PPO

## 2017-11-09 VITALS — BP 121/80 | HR 80 | Temp 98.9°F | Resp 20

## 2017-11-09 DIAGNOSIS — C7951 Secondary malignant neoplasm of bone: Secondary | ICD-10-CM | POA: Diagnosis not present

## 2017-11-09 DIAGNOSIS — C7800 Secondary malignant neoplasm of unspecified lung: Secondary | ICD-10-CM

## 2017-11-09 DIAGNOSIS — C50812 Malignant neoplasm of overlapping sites of left female breast: Secondary | ICD-10-CM | POA: Diagnosis not present

## 2017-11-09 DIAGNOSIS — Z7189 Other specified counseling: Secondary | ICD-10-CM

## 2017-11-09 DIAGNOSIS — C50912 Malignant neoplasm of unspecified site of left female breast: Secondary | ICD-10-CM

## 2017-11-09 LAB — CBC WITH DIFFERENTIAL/PLATELET
BASO%: 1.7 % (ref 0.0–2.0)
Basophils Absolute: 0.1 10*3/uL (ref 0.0–0.1)
EOS ABS: 0.1 10*3/uL (ref 0.0–0.5)
EOS%: 1.7 % (ref 0.0–7.0)
HCT: 40.8 % (ref 34.8–46.6)
HEMOGLOBIN: 13.5 g/dL (ref 11.6–15.9)
LYMPH%: 38.3 % (ref 14.0–49.7)
MCH: 32.1 pg (ref 25.1–34.0)
MCHC: 33.1 g/dL (ref 31.5–36.0)
MCV: 96.9 fL (ref 79.5–101.0)
MONO#: 0.3 10*3/uL (ref 0.1–0.9)
MONO%: 5.9 % (ref 0.0–14.0)
NEUT%: 52.4 % (ref 38.4–76.8)
NEUTROS ABS: 2.4 10*3/uL (ref 1.5–6.5)
Platelets: 223 10*3/uL (ref 145–400)
RBC: 4.21 10*6/uL (ref 3.70–5.45)
RDW: 14.6 % — AB (ref 11.2–14.5)
WBC: 4.6 10*3/uL (ref 3.9–10.3)
lymph#: 1.8 10*3/uL (ref 0.9–3.3)

## 2017-11-09 LAB — COMPREHENSIVE METABOLIC PANEL
ALBUMIN: 3.7 g/dL (ref 3.5–5.0)
ALT: 33 U/L (ref 0–55)
AST: 27 U/L (ref 5–34)
Alkaline Phosphatase: 70 U/L (ref 40–150)
Anion Gap: 10 mEq/L (ref 3–11)
BILIRUBIN TOTAL: 0.4 mg/dL (ref 0.20–1.20)
BUN: 13.5 mg/dL (ref 7.0–26.0)
CO2: 24 meq/L (ref 22–29)
Calcium: 9.6 mg/dL (ref 8.4–10.4)
Chloride: 105 mEq/L (ref 98–109)
Creatinine: 1.3 mg/dL — ABNORMAL HIGH (ref 0.6–1.1)
EGFR: 53 mL/min/{1.73_m2} — AB (ref >60)
GLUCOSE: 91 mg/dL (ref 70–140)
POTASSIUM: 3.7 meq/L (ref 3.5–5.1)
SODIUM: 140 meq/L (ref 136–145)
TOTAL PROTEIN: 7 g/dL (ref 6.4–8.3)

## 2017-11-09 MED ORDER — DENOSUMAB 120 MG/1.7ML ~~LOC~~ SOLN
120.0000 mg | Freq: Once | SUBCUTANEOUS | Status: AC
  Administered 2017-11-09: 120 mg via SUBCUTANEOUS

## 2017-11-09 NOTE — Patient Instructions (Signed)

## 2017-11-10 LAB — VITAMIN D 25 HYDROXY (VIT D DEFICIENCY, FRACTURES): Vitamin D, 25-Hydroxy: 22.6 ng/mL — ABNORMAL LOW (ref 30.0–100.0)

## 2017-11-10 LAB — CANCER ANTIGEN 15-3: CAN 15 3: 13.3 U/mL (ref 0.0–25.0)

## 2017-11-10 LAB — CANCER ANTIGEN 27.29: CAN 27.29: 16.5 U/mL (ref 0.0–38.6)

## 2017-11-23 NOTE — Progress Notes (Signed)
[name]    HEMATOLOGY/ONCOLOGY CLINIC NOTE  Date of Service: 11/24/17   Patient Care Team: Patient, No Pcp Per as PCP - General (General Practice)  CHIEF COMPLAINTS/PURPOSE OF CONSULTATION:  F/u for breast cancer HISTORY OF PRESENTING ILLNESS:   plz see previous notes for details.  INTERVAL HISTORY   Stacey Cox is here for follow-up of her metastatic hormone positive HER-2 negative breast cancer. She is accompanied by her friend.   She notes her diarrhea is managed with 1 tab of imodium 1-2 times a day. Her main concern is her fatigue. She is not sure if it is from her being tired to her being depressed. This may be related to her being out of work for so long. She is eating well. She notes having hot flashes at night. Her sleeping is not great but she believes this is related to her sleeping during the day. She denies dry mouth or mouth sores. She saw her optometrist who says she has dry eyes. She is to use artificial tears. She has bought a humidifier to help as well.  No issues with recurrent rash. She is taking lexapro and Ativan and notes she feels she needs to see a psychologist again. She plans to go to an event, [employer] this year. She also feels she needs to get outside more.    MEDICAL HISTORY:  Past Medical History:  Diagnosis Date  . Cancer [facility])    Metastatic Breast Cancer    SURGICAL HISTORY: Past Surgical History:  Procedure Laterality Date  . CHOLECYSTECTOMY    . TONSILLECTOMY      SOCIAL HISTORY: Social History   Socioeconomic History  . Marital status: Single    Spouse name: Not on file  . Number of children: Not on file  . Years of education: Not on file  . Highest education level: Not on file  Social Needs  . Financial resource strain: Not on file  . Food insecurity - worry: Not on file  . Food insecurity - inability: Not on file  . Transportation needs - medical: Not on file  . Transportation needs - non-medical: Not on file    Occupational History  . Not on file  Tobacco Use  . Smoking status: Former Smoker    Packs/day: 0.00    Types: Cigarettes    Last attempt to quit: 01/21/2017    Years since quitting: 0.8  . Smokeless tobacco: Never Used  Substance and Sexual Activity  . Alcohol use: No    Alcohol/week: 0.0 oz  . Drug use: No  . Sexual activity: No  Other Topics Concern  . Not on file  Social History Narrative  . 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 Cancer Mother        breast  . Cancer Sister        breast  Mother with h/o breast cancer in her 93's Sister with breast cancer in her 79ys and later was diagnosed with multiple myeloma. (Patient is not aware of any specific breast cancer mutations present)  ALLERGIES:  is allergic to thorazine [chlorpromazine] and ribociclib.  MEDICATIONS:  Current Outpatient Medications  Medication Sig Dispense Refill  . aspirin EC 81 MG tablet Take 1 tablet (81 mg total) by mouth daily.    . ergocalciferol (VITAMIN D2) 50000 units capsule  Take one capsule by mouth three times weekly. 24 capsule 3  . escitalopram (LEXAPRO) 10 MG tablet Take 1 tablet (10 mg total) by mouth daily. 30 tablet 0  . HYDROcodone-acetaminophen (NORCO/VICODIN) 5-325 MG tablet Take 1-2 tablets by mouth every 4 (four) hours as needed for moderate pain or severe pain. 20 tablet 0  . ibuprofen (ADVIL,MOTRIN) 600 MG tablet Take 1 tablet (600 mg total) by mouth every 6 (six) hours as needed. 30 tablet 0  . letrozole (FEMARA) 2.5 MG tablet Take 1 tablet (2.5 mg total) by mouth daily. 30 tablet 6  . loperamide (IMODIUM) 1 MG/5ML solution Take 2 mg by mouth as needed for diarrhea or loose stools.     [name] LORazepam (ATIVAN) 0.5 MG tablet Take 1 tablet (0.5 mg total) by mouth every 6 (six) hours as needed. for anxiety 60 tablet 0  . ondansetron  (ZOFRAN) 4 MG tablet Take 1 tablet (4 mg total) by mouth 2 (two) times daily. Take with Verzenio. 60 tablet 2  . abemaciclib (VERZENIO) 150 MG tablet TAKE 1 TABLET (150 MG TOTAL) BY MOUTH 2 TIMES DAILY. 56 tablet 0   No current facility-administered medications for this visit.     REVIEW OF SYSTEMS:    10 Point review of Systems was done is negative except as noted above.  PHYSICAL EXAMINATION:  ECOG PERFORMANCE STATUS: 1 - Symptomatic but completely ambulatory  . Vitals:   11/24/17 1426  BP: 121/87  Pulse: 60  Resp: 18  Temp: 98.9 F (37.2 C)  SpO2: 100%   Filed Weights   11/24/17 1426  Weight: 167 lb 14.4 oz (76.2 kg)   .Body mass index is 27.94 kg/m.  GENERAL:alert, in no acute distress and comfortable SKIN: completely resolved exfoliative dermatitis. No recurrence on Vernezio EYES: conjunctiva are pink and non-injected, sclera anicteric OROPHARYNX: MMM, no exudates, no oropharyngeal erythema or ulceration NECK: supple, no JVD LYMPH:  no palpable lymphadenopathy in the cervical, axillary or inguinal regions LUNGS: clear to auscultation b/l with normal respiratory effort HEART: regular rate & rhythm ABDOMEN:  normoactive bowel sounds , non tender, not distended. Extremity: no pedal edema PSYCH: alert & oriented x 3 with fluent speech NEURO: no focal motor/sensory deficits Breast examination not done today. Patient reports no breast pain or tenderness and no ulcerated areas.  LABORATORY DATA:  I have reviewed the data as listed  . CBC Latest Ref Rng & Units 11/24/2017 11/09/2017 09/05/2017  WBC 3.9 - 10.3 10e3/uL 4.2 4.6 4.4  Hemoglobin 11.6 - 15.9 g/dL 13.9 13.5 13.2  Hematocrit 34.8 - 46.6 % 42.0 40.8 39.8  Platelets 145 - 400 10e3/uL 212 223 222   . CBC    Component Value Date/Time   WBC 4.2 11/24/2017 1340   WBC 9.6 01/31/2017 1324   RBC 4.33 11/24/2017 1340   RBC 4.41 01/31/2017 1324   HGB 13.9 11/24/2017 1340   HCT 42.0 11/24/2017 1340   PLT 212  11/24/2017 1340   MCV 97.0 11/24/2017 1340   MCH 32.1 11/24/2017 1340   MCH 28.3 01/31/2017 1324   MCHC 33.1 11/24/2017 1340   MCHC 33.2 01/31/2017 1324   RDW 14.1 11/24/2017 1340   LYMPHSABS 1.4 11/24/2017 1340   MONOABS 0.3 11/24/2017 1340   EOSABS 0.1 11/24/2017 1340   BASOSABS 0.1 11/24/2017 1340    . CMP Latest Ref Rng & Units 11/24/2017 11/09/2017 10/06/2017  Glucose 70 - 140 mg/dl 83 91 102  BUN 7.0 - 26.0 mg/dL 9.7 13.5 10.2  Creatinine 0.6 - 1.1 mg/dL 1.2(H) 1.3(H) 1.2(H)  Sodium 136 - 145 mEq/L 139 140 140  Potassium 3.5 - 5.1 mEq/L 3.7 3.7 3.6  Chloride 101 - 111 mmol/L - - -  CO2 22 - 29 mEq/L 25 24 24   Calcium 8.4 - 10.4 mg/dL 9.3 9.6 8.9  Total Protein 6.4 - 8.3 g/dL 7.1 7.0 6.6  Total Bilirubin 0.20 - 1.20 mg/dL 0.40 0.40 0.57  Alkaline Phos 40 - 150 U/L 73 70 69  AST 5 - 34 U/L 29 27 25   ALT 0 - 55 U/L 35 33 33               RADIOGRAPHIC STUDIES: I have personally reviewed the radiological images as listed and agreed with the findings in the report. No results found.  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 Multifocal tumor in the left breast with biopsy-proven left axillary metastases.   Noted to have extensive bone metastases and pulmonary metastases. Patient is noted to have calvarial metastases but no overt parenchymal metastasis She has been noted to have good clinical response and her tumor marker levels of progressively normalized. 09/14/17 CT Chest/ad/pelvis Results discussed in details - good response.  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 Much improved back pain.  # 3 Neutropenia Related to her Ribociclib - resolved. Patient is currently on Verzenio and has not developed any neutropenia. This is being monitored.  #4 Increased ALT about 2X ULN - likely from Ribociclib - now resolved. Monitoring with  Verzenio.  #5 Cancer related pain - controlled - status post palliative RT to spine - no currently needing any pain medications.  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 No other new medication. Now resolved. Monitoring on increasing doses of Verzenio. No issues with recurrent rash on Verzenio 157m po BID  #7 Grade 1 diarrhea related to her Verzenio. She notes it is controlled with Imodium when necessary. We discussed that we would need to hold the medication noted in the dose of the diarrhea worsens. Patient is hesitant to reduce the dose of this point and notes that she will monitor it.  #8 Stable creatinine to 1.3. Monitor. Patient recommended to stay well-hydrated.  Plan  -labs stable and WNL, -no prohibitive toxicities from treatment at this time. - cancer tumor markers stable. -patients rash has not recurred on Verzenio 1576mpo BID and she will continue the same. -Grade 1 diarrhea related to her Verzenio. She notes it is controlled with Imodium when necessary and she does not feel dose reduction to help symptoms is necessary at this time. -she will continue daily letrozole 2.5 mg. -Continue on Xgeva every 4 weeks. -continue active lifestyle with good diet and exercise. -she reports she will f/u with her psychological counselor to help with talking through some of her concerns   #6 Vitamin D deficiency up from 14 to 21.6 to 22.6 to 26.5 as of 09/05/2017. -Vitamin D level pending today  -Continue ergocalciferol at current dose. 50k units 3 times a week. Recommended she take this with a   PLAN -Continue Xgeva q4 weeks -Continue same dose of Ergocalciferol 3x weekly -Received Prevnar vaccine on 09/14/17 -Pneumococcal conjugate vaccine 2-3 months after Prevnar vaccination. Will schedule with next Xgeva shot.   Continue Xgeva q4weeks plz schedule next 4 doses PCV vaccine when she returns on 12/07/2016 for [employer]  RTC with Dr Irene Limbo with in 6 weeks on the  same day as Xgeva dose after the one on 12/07/2016 with labs   All of the patients questions were answered with apparent satisfaction. The patient knows to call the clinic with any problems, questions or concerns.  I spent 20 minutes counseling the patient face to face. The total time spent in the appointment was 25 minutes and more than 50% was on counseling and direct patient cares.    Sullivan Lone MD [place] AAHIVMS [facility] Hematology/Oncology Physician [facility]  (Office):       [phone] (Work cell):  [phone] (Fax):           [phone]  This document serves as a record of services personally performed by Sullivan Lone, MD. It was created on his behalf by Joslyn Devon, a trained medical scribe. The creation of this record is based on the scribe's personal observations and the provider's statements to them.    .I have reviewed the above documentation for accuracy and completeness, and I agree with the above. [name] MD MS

## 2017-11-24 ENCOUNTER — Other Ambulatory Visit (HOSPITAL_BASED_OUTPATIENT_CLINIC_OR_DEPARTMENT_OTHER): Payer: BC Managed Care – PPO

## 2017-11-24 ENCOUNTER — Other Ambulatory Visit: Payer: Self-pay | Admitting: Hematology

## 2017-11-24 ENCOUNTER — Ambulatory Visit (HOSPITAL_BASED_OUTPATIENT_CLINIC_OR_DEPARTMENT_OTHER): Payer: BC Managed Care – PPO | Admitting: Hematology

## 2017-11-24 ENCOUNTER — Ambulatory Visit: Payer: BC Managed Care – PPO

## 2017-11-24 VITALS — BP 121/87 | HR 60 | Temp 98.9°F | Resp 18 | Ht 65.0 in | Wt 167.9 lb

## 2017-11-24 DIAGNOSIS — C7951 Secondary malignant neoplasm of bone: Secondary | ICD-10-CM | POA: Diagnosis not present

## 2017-11-24 DIAGNOSIS — R197 Diarrhea, unspecified: Secondary | ICD-10-CM

## 2017-11-24 DIAGNOSIS — C50812 Malignant neoplasm of overlapping sites of left female breast: Secondary | ICD-10-CM

## 2017-11-24 DIAGNOSIS — G893 Neoplasm related pain (acute) (chronic): Secondary | ICD-10-CM

## 2017-11-24 DIAGNOSIS — D701 Agranulocytosis secondary to cancer chemotherapy: Secondary | ICD-10-CM

## 2017-11-24 DIAGNOSIS — Z170 Estrogen receptor positive status [ER+]: Secondary | ICD-10-CM

## 2017-11-24 DIAGNOSIS — C7800 Secondary malignant neoplasm of unspecified lung: Secondary | ICD-10-CM | POA: Diagnosis not present

## 2017-11-24 DIAGNOSIS — C50919 Malignant neoplasm of unspecified site of unspecified female breast: Secondary | ICD-10-CM

## 2017-11-24 DIAGNOSIS — E559 Vitamin D deficiency, unspecified: Secondary | ICD-10-CM

## 2017-11-24 LAB — CBC & DIFF AND RETIC
BASO%: 1.4 % (ref 0.0–2.0)
BASOS ABS: 0.1 10*3/uL (ref 0.0–0.1)
EOS ABS: 0.1 10*3/uL (ref 0.0–0.5)
EOS%: 1.4 % (ref 0.0–7.0)
HEMATOCRIT: 42 % (ref 34.8–46.6)
HEMOGLOBIN: 13.9 g/dL (ref 11.6–15.9)
Immature Retic Fract: 4.5 % (ref 1.60–10.00)
LYMPH%: 33.9 % (ref 14.0–49.7)
MCH: 32.1 pg (ref 25.1–34.0)
MCHC: 33.1 g/dL (ref 31.5–36.0)
MCV: 97 fL (ref 79.5–101.0)
MONO#: 0.3 10*3/uL (ref 0.1–0.9)
MONO%: 7.2 % (ref 0.0–14.0)
NEUT#: 2.4 10*3/uL (ref 1.5–6.5)
NEUT%: 56.1 % (ref 38.4–76.8)
PLATELETS: 212 10*3/uL (ref 145–400)
RBC: 4.33 10*6/uL (ref 3.70–5.45)
RDW: 14.1 % (ref 11.2–14.5)
Retic %: 1.38 % (ref 0.70–2.10)
Retic Ct Abs: 59.75 10*3/uL (ref 33.70–90.70)
WBC: 4.2 10*3/uL (ref 3.9–10.3)
lymph#: 1.4 10*3/uL (ref 0.9–3.3)

## 2017-11-24 LAB — COMPREHENSIVE METABOLIC PANEL
ALT: 35 U/L (ref 0–55)
AST: 29 U/L (ref 5–34)
Albumin: 3.8 g/dL (ref 3.5–5.0)
Alkaline Phosphatase: 73 U/L (ref 40–150)
Anion Gap: 9 mEq/L (ref 3–11)
BUN: 9.7 mg/dL (ref 7.0–26.0)
CALCIUM: 9.3 mg/dL (ref 8.4–10.4)
CHLORIDE: 104 meq/L (ref 98–109)
CO2: 25 meq/L (ref 22–29)
CREATININE: 1.2 mg/dL — AB (ref 0.6–1.1)
EGFR: 56 mL/min/{1.73_m2} — ABNORMAL LOW (ref >60)
Glucose: 83 mg/dl (ref 70–140)
Potassium: 3.7 mEq/L (ref 3.5–5.1)
Sodium: 139 mEq/L (ref 136–145)
Total Bilirubin: 0.4 mg/dL (ref 0.20–1.20)
Total Protein: 7.1 g/dL (ref 6.4–8.3)

## 2017-11-25 LAB — CANCER ANTIGEN 27.29: CA 27.29: 15.8 U/mL (ref 0.0–38.6)

## 2017-11-25 LAB — VITAMIN D 25 HYDROXY (VIT D DEFICIENCY, FRACTURES): Vitamin D, 25-Hydroxy: 18.5 ng/mL — ABNORMAL LOW (ref 30.0–100.0)

## 2017-11-25 LAB — CANCER ANTIGEN 15-3: CAN 15 3: 14.6 U/mL (ref 0.0–25.0)

## 2017-11-26 ENCOUNTER — Other Ambulatory Visit: Payer: Self-pay

## 2017-11-26 MED ORDER — ABEMACICLIB 150 MG PO TABS: ORAL_TABLET | ORAL | 0 refills | Status: DC

## 2017-12-01 MED FILL — VERZENIO 150 MG TAB: 150 | 28 days supply | Qty: 56 | Fill #0

## 2017-12-03 ENCOUNTER — Other Ambulatory Visit: Payer: Self-pay | Admitting: Hematology

## 2017-12-05 ENCOUNTER — Other Ambulatory Visit: Payer: Self-pay

## 2017-12-05 MED ORDER — ESCITALOPRAM OXALATE 10 MG PO TABS: 10.0000 mg | ORAL_TABLET | Freq: Every day | ORAL | 0 refills | Status: DC

## 2017-12-06 ENCOUNTER — Other Ambulatory Visit: Payer: Self-pay | Admitting: Hematology

## 2017-12-06 DIAGNOSIS — Z23 Encounter for immunization: Secondary | ICD-10-CM

## 2017-12-06 MED ORDER — PNEUMOCOCCAL VAC POLYVALENT 25 MCG/0.5ML IJ INJ: 0.5000 mL | INJECTION | INTRAMUSCULAR | Status: DC

## 2017-12-07 ENCOUNTER — Inpatient Hospital Stay: Payer: BC Managed Care – PPO

## 2017-12-07 ENCOUNTER — Inpatient Hospital Stay: Payer: BC Managed Care – PPO | Attending: Hematology

## 2017-12-07 DIAGNOSIS — C7951 Secondary malignant neoplasm of bone: Secondary | ICD-10-CM

## 2017-12-07 DIAGNOSIS — Z7189 Other specified counseling: Secondary | ICD-10-CM

## 2017-12-07 DIAGNOSIS — Z23 Encounter for immunization: Secondary | ICD-10-CM

## 2017-12-07 DIAGNOSIS — C50812 Malignant neoplasm of overlapping sites of left female breast: Secondary | ICD-10-CM | POA: Insufficient documentation

## 2017-12-07 DIAGNOSIS — C50919 Malignant neoplasm of unspecified site of unspecified female breast: Secondary | ICD-10-CM

## 2017-12-07 LAB — CBC WITH DIFFERENTIAL (CANCER CENTER ONLY)
BASOS ABS: 0.1 10*3/uL (ref 0.0–0.1)
BASOS PCT: 2 %
EOS ABS: 0.1 10*3/uL (ref 0.0–0.5)
EOS PCT: 2 %
HCT: 39.5 % (ref 34.8–46.6)
Hemoglobin: 13.1 g/dL (ref 11.6–15.9)
Lymphocytes Relative: 28 %
Lymphs Abs: 1 10*3/uL (ref 0.9–3.3)
MCH: 32 pg (ref 25.1–34.0)
MCHC: 33.2 g/dL (ref 31.5–36.0)
MCV: 96.4 fL (ref 79.5–101.0)
MONO ABS: 0.3 10*3/uL (ref 0.1–0.9)
Monocytes Relative: 9 %
Neutro Abs: 2 10*3/uL (ref 1.5–6.5)
Neutrophils Relative %: 59 %
PLATELETS: 205 10*3/uL (ref 145–400)
RBC: 4.1 MIL/uL (ref 3.70–5.45)
RDW: 14.3 % (ref 11.2–16.1)
WBC Count: 3.5 10*3/uL — ABNORMAL LOW (ref 3.9–10.3)

## 2017-12-07 LAB — COMPREHENSIVE METABOLIC PANEL
ALT: 28 U/L (ref 0–55)
AST: 27 U/L (ref 5–34)
Albumin: 3.6 g/dL (ref 3.5–5.0)
Alkaline Phosphatase: 62 U/L (ref 40–150)
Anion gap: 8 (ref 3–11)
BUN: 15 mg/dL (ref 7–26)
CO2: 25 mmol/L (ref 22–29)
CREATININE: 1.25 mg/dL — AB (ref 0.60–1.10)
Calcium: 9.4 mg/dL (ref 8.4–10.4)
Chloride: 109 mmol/L (ref 98–109)
GFR calc Af Amer: 53 mL/min — ABNORMAL LOW (ref >60)
GFR calc non Af Amer: 45 mL/min — ABNORMAL LOW (ref >60)
Glucose, Bld: 78 mg/dL (ref 70–140)
Potassium: 3.6 mmol/L (ref 3.3–4.7)
Sodium: 142 mmol/L (ref 136–145)
Total Bilirubin: 0.5 mg/dL (ref 0.2–1.2)
Total Protein: 6.8 g/dL (ref 6.4–8.3)

## 2017-12-07 MED ORDER — DENOSUMAB 120 MG/1.7ML ~~LOC~~ SOLN
SUBCUTANEOUS | Status: AC
  Filled 2017-12-07: qty 1.7

## 2017-12-07 MED ORDER — DENOSUMAB 120 MG/1.7ML ~~LOC~~ SOLN
120.0000 mg | Freq: Once | SUBCUTANEOUS | Status: AC
  Administered 2017-12-07: 120 mg via SUBCUTANEOUS

## 2017-12-07 MED ORDER — PNEUMOCOCCAL VAC POLYVALENT 25 MCG/0.5ML IJ INJ
0.5000 mL | INJECTION | Freq: Once | INTRAMUSCULAR | Status: AC
  Administered 2017-12-07: 0.5 mL via INTRAMUSCULAR
  Filled 2017-12-07: qty 0.5

## 2017-12-07 NOTE — Patient Instructions (Signed)
Pneumococcal Vaccine, Polyvalent solution for injection What is this medicine? PNEUMOCOCCAL VACCINE, POLYVALENT (NEU mo KOK al vak SEEN, pol ee VEY luhnt) is a vaccine to prevent pneumococcus bacteria infection. These bacteria are a major cause of ear infections, Strep throat infections, and serious pneumonia, meningitis, or blood infections worldwide. These vaccines help the body to produce antibodies (protective substances) that help your body defend against these bacteria. This vaccine is recommended for people 61 years of age and older with health problems. It is also recommended for all adults over 73 years old. This vaccine will not treat an infection. This medicine may be used for other purposes; ask your health care provider or pharmacist if you have questions. COMMON BRAND NAME(S): Pneumovax 23 What should I tell my health care provider before I take this medicine? They need to know if you have any of these conditions: -bleeding problems -bone marrow or organ transplant -cancer, Hodgkin's disease -fever -infection -immune system problems -low platelet count in the blood -seizures -an unusual or allergic reaction to pneumococcal vaccine, diphtheria toxoid, other vaccines, latex, other medicines, foods, dyes, or preservatives -pregnant or trying to get pregnant -breast-feeding How should I use this medicine? This vaccine is for injection into a muscle or under the skin. It is given by a health care professional. A copy of Vaccine Information Statements will be given before each vaccination. Read this sheet carefully each time. The sheet may change frequently. Talk to your pediatrician regarding the use of this medicine in children. While this drug may be prescribed for children as young as 63 years of age for selected conditions, precautions do apply. Overdosage: If you think you have taken too much of this medicine contact a poison control center or emergency room at once. NOTE: This  medicine is only for you. Do not share this medicine with others. What if I miss a dose? It is important not to miss your dose. Call your doctor or health care professional if you are unable to keep an appointment. What may interact with this medicine? -medicines for cancer chemotherapy -medicines that suppress your immune function -medicines that treat or prevent blood clots like warfarin, enoxaparin, and dalteparin -steroid medicines like prednisone or cortisone This list may not describe all possible interactions. Give your health care provider a list of all the medicines, herbs, non-prescription drugs, or dietary supplements you use. Also tell them if you smoke, drink alcohol, or use illegal drugs. Some items may interact with your medicine. What should I watch for while using this medicine? Mild fever and pain should go away in 3 days or less. Report any unusual symptoms to your doctor or health care professional. What side effects may I notice from receiving this medicine? Side effects that you should report to your doctor or health care professional as soon as possible: -allergic reactions like skin rash, itching or hives, swelling of the face, lips, or tongue -breathing problems -confused -fever over 102 degrees F -pain, tingling, numbness in the hands or feet -seizures -unusual bleeding or bruising -unusual muscle weakness Side effects that usually do not require medical attention (report to your doctor or health care professional if they continue or are bothersome): -aches and pains -diarrhea -fever of 102 degrees F or less -headache -irritable -loss of appetite -pain, tender at site where injected -trouble sleeping This list may not describe all possible side effects. Call your doctor for medical advice about side effects. You may report side effects to FDA at 1-800-FDA-1088. Where should  I keep my medicine? This does not apply. This vaccine is given in a clinic, pharmacy,  doctor's office, or other health care setting and will not be stored at home. NOTE: This sheet is a summary. It may not cover all possible information. If you have questions about this medicine, talk to your doctor, pharmacist, or health care provider.  2018 Elsevier/Gold Standard (2008-06-14 14:32:37) Denosumab injection What is this medicine? DENOSUMAB (den oh sue mab) slows bone breakdown. Prolia is used to treat osteoporosis in women after menopause and in men. Delton See is used to treat a high calcium level due to cancer and to prevent bone fractures and other bone problems caused by multiple myeloma or cancer bone metastases. Delton See is also used to treat giant cell tumor of the bone. This medicine may be used for other purposes; ask your health care provider or pharmacist if you have questions. COMMON BRAND NAME(S): Prolia, XGEVA What should I tell my health care provider before I take this medicine? They need to know if you have any of these conditions: -dental disease -having surgery or tooth extraction -infection -kidney disease -low levels of calcium or Vitamin D in the blood -malnutrition -on hemodialysis -skin conditions or sensitivity -thyroid or parathyroid disease -an unusual reaction to denosumab, other medicines, foods, dyes, or preservatives -pregnant or trying to get pregnant -breast-feeding How should I use this medicine? This medicine is for injection under the skin. It is given by a health care professional in a hospital or clinic setting. If you are getting Prolia, a special MedGuide will be given to you by the pharmacist with each prescription and refill. Be sure to read this information carefully each time. For Prolia, talk to your pediatrician regarding the use of this medicine in children. Special care may be needed. For Delton See, talk to your pediatrician regarding the use of this medicine in children. While this drug may be prescribed for children as young as 13 years  for selected conditions, precautions do apply. Overdosage: If you think you have taken too much of this medicine contact a poison control center or emergency room at once. NOTE: This medicine is only for you. Do not share this medicine with others. What if I miss a dose? It is important not to miss your dose. Call your doctor or health care professional if you are unable to keep an appointment. What may interact with this medicine? Do not take this medicine with any of the following medications: -other medicines containing denosumab This medicine may also interact with the following medications: -medicines that lower your chance of fighting infection -steroid medicines like prednisone or cortisone This list may not describe all possible interactions. Give your health care provider a list of all the medicines, herbs, non-prescription drugs, or dietary supplements you use. Also tell them if you smoke, drink alcohol, or use illegal drugs. Some items may interact with your medicine. What should I watch for while using this medicine? Visit your doctor or health care professional for regular checks on your progress. Your doctor or health care professional may order blood tests and other tests to see how you are doing. Call your doctor or health care professional for advice if you get a fever, chills or sore throat, or other symptoms of a cold or flu. Do not treat yourself. This drug may decrease your body's ability to fight infection. Try to avoid being around people who are sick. You should make sure you get enough calcium and vitamin D while you  are taking this medicine, unless your doctor tells you not to. Discuss the foods you eat and the vitamins you take with your health care professional. See your dentist regularly. Brush and floss your teeth as directed. Before you have any dental work done, tell your dentist you are receiving this medicine. Do not become pregnant while taking this medicine or for  5 months after stopping it. Talk with your doctor or health care professional about your birth control options while taking this medicine. Women should inform their doctor if they wish to become pregnant or think they might be pregnant. There is a potential for serious side effects to an unborn child. Talk to your health care professional or pharmacist for more information. What side effects may I notice from receiving this medicine? Side effects that you should report to your doctor or health care professional as soon as possible: -allergic reactions like skin rash, itching or hives, swelling of the face, lips, or tongue -bone pain -breathing problems -dizziness -jaw pain, especially after dental work -redness, blistering, peeling of the skin -signs and symptoms of infection like fever or chills; cough; sore throat; pain or trouble passing urine -signs of low calcium like fast heartbeat, muscle cramps or muscle pain; pain, tingling, numbness in the hands or feet; seizures -unusual bleeding or bruising -unusually weak or tired Side effects that usually do not require medical attention (report to your doctor or health care professional if they continue or are bothersome): -constipation -diarrhea -headache -joint pain -loss of appetite -muscle pain -runny nose -tiredness -upset stomach This list may not describe all possible side effects. Call your doctor for medical advice about side effects. You may report side effects to FDA at 1-800-FDA-1088. Where should I keep my medicine? This medicine is only given in a clinic, doctor's office, or other health care setting and will not be stored at home. NOTE: This sheet is a summary. It may not cover all possible information. If you have questions about this medicine, talk to your doctor, pharmacist, or health care provider.  2018 Elsevier/Gold Standard (2016-11-30 19:17:21)

## 2017-12-08 LAB — CANCER ANTIGEN 15-3: CA 15-3: 13.8 U/mL (ref 0.0–25.0)

## 2017-12-08 LAB — CANCER ANTIGEN 27.29: CA 27.29: 15.8 U/mL (ref 0.0–38.6)

## 2017-12-14 ENCOUNTER — Telehealth: Payer: Self-pay | Admitting: Hematology

## 2017-12-14 NOTE — Telephone Encounter (Signed)
Scheduled appt per 1/03 los - and per vmail left by patient - scheduled appts and left message with apts date and time -

## 2017-12-17 ENCOUNTER — Other Ambulatory Visit: Payer: Self-pay | Admitting: Hematology

## 2017-12-17 DIAGNOSIS — C50919 Malignant neoplasm of unspecified site of unspecified female breast: Secondary | ICD-10-CM

## 2017-12-26 ENCOUNTER — Other Ambulatory Visit: Payer: Self-pay | Admitting: Hematology

## 2017-12-27 ENCOUNTER — Other Ambulatory Visit: Payer: Self-pay | Admitting: Hematology

## 2017-12-30 MED FILL — VERZENIO 150 MG TAB: 150 | 28 days supply | Qty: 56 | Fill #0

## 2018-01-02 NOTE — Progress Notes (Signed)
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  INTERVAL HISTORY   Stacey Cox is here for follow-up of her metastatic hormone positive HER-2 negative breast cancer. The patient's last visit with [place] was on 11/24/17. The pt reports that she is doing well overall and is hoping to travel to [place] and [place] in the Spring. The pt notes she wants to see if we can up the dose of her Lexapro due to some issues with anxiety.  She reports taking Ativan every night and Lexapro every morning. She notes feeling some anxiety at night but she notes that her friends and community are very involved with getting her out of the house in the evenings. She reports that she has had some back pain but that she doesn't use OTC NSAIDs to treat it, and denies it being troublesome. She also reports some diarrhea and that Imodium prn treats this successfully. She notes that her Melynda Keller has continued to be satisfactory for her. She notes her Delton See shots are also continuing to go well.   Lab results today (01/04/18) of CBC is as follows: all values are WNL except for WBC at 3.5k. CMP stable CA 27.29 on 12/07/17 15.8.   On review of systems, pt reports some diarrhea (uses Imodium prn), dry eyes (uses eye drops), some back pain,  and denies nausea, skin rashes, mouth sores, mouth soreness, leg swelling, breast discomfort and any other symptoms.  MEDICAL HISTORY:  Past Medical History:  Diagnosis Date  . Cancer [facility])    Metastatic Breast Cancer    SURGICAL HISTORY: Past Surgical History:  Procedure Laterality Date  . CHOLECYSTECTOMY    . TONSILLECTOMY      SOCIAL HISTORY: Social History   Socioeconomic History  . Marital status: Single   Spouse name: Not on file  . Number of children: Not on file  . Years of education: Not on file  . Highest education level: Not on file  Social Needs  . Financial resource strain: Not on file  . Food insecurity - worry: Not on file  . Food insecurity - inability: Not on file  . Transportation needs - medical: Not on file  . Transportation needs - non-medical: Not on file  Occupational History  . Not on file  Tobacco Use  . Smoking status: Former Smoker    Packs/day: 0.00    Types: Cigarettes    Last attempt to quit: 01/21/2017    Years since quitting: 0.9  . Smokeless tobacco: Never Used  Substance and Sexual Activity  . Alcohol use: No    Alcohol/week: 0.0 oz  . Drug use: No  . Sexual activity: No  Other Topics Concern  . Not on file  Social History Narrative  . 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 Cancer Mother        breast  . Cancer Sister        breast  Mother with h/o breast cancer in her 44's Sister with breast cancer in her 47ys and later was diagnosed with multiple myeloma. (Patient  is not aware of any specific breast cancer mutations present)  ALLERGIES:  is allergic to thorazine [chlorpromazine] and ribociclib.  MEDICATIONS:  Current Outpatient Medications  Medication Sig Dispense Refill  . aspirin EC 81 MG tablet Take 1 tablet (81 mg total) by mouth daily.    . ergocalciferol (VITAMIN D2) 50000 units capsule Take one capsule by mouth three times weekly. 24 capsule 3  . escitalopram (LEXAPRO) 20 MG tablet Take 1 tablet (20 mg total) by mouth daily. 30 tablet 1  . HYDROcodone-acetaminophen (NORCO/VICODIN) 5-325 MG tablet Take 1-2 tablets by mouth every 4 (four) hours as needed for moderate pain or severe pain. 20 tablet 0  . ibuprofen (ADVIL,MOTRIN) 600 MG tablet Take 1 tablet (600 mg total) by mouth every 6  (six) hours as needed. 30 tablet 0  . letrozole (FEMARA) 2.5 MG tablet TAKE 1 TABLET(2.5 MG) BY MOUTH DAILY 30 tablet 0  . loperamide (IMODIUM) 1 MG/5ML solution Take 2 mg by mouth as needed for diarrhea or loose stools.     [name] LORazepam (ATIVAN) 0.5 MG tablet TAKE 1 TABLET BY MOUTH EVERY 6 HOURS AS NEEDED FOR ANXIETY 60 tablet 0  . ondansetron (ZOFRAN) 4 MG tablet Take 1 tablet (4 mg total) by mouth 2 (two) times daily. Take with Verzenio. 60 tablet 2  . VERZENIO 150 MG tablet TAKE 1 TABLET (150 MG TOTAL) BY MOUTH 2 TIMES DAILY. 56 tablet 0   No current facility-administered medications for this visit.     REVIEW OF SYSTEMS:    10 Point review of Systems was done is negative except as noted above.  PHYSICAL EXAMINATION:  ECOG PERFORMANCE STATUS: 1 - Symptomatic but completely ambulatory  . Vitals:   01/04/18 0920  BP: 123/90  Pulse: 83  Resp: 18  Temp: 98.8 F (37.1 C)  SpO2: 100%   Filed Weights   01/04/18 0920  Weight: 167 lb 12.8 oz (76.1 kg)   .Body mass index is 27.92 kg/m. [name] GENERAL:alert, in no acute distress and comfortable SKIN: no acute rashes, no significant lesions EYES: conjunctiva are pink and non-injected, sclera anicteric OROPHARYNX: MMM, no exudates, no oropharyngeal erythema or ulceration NECK: supple, no JVD LYMPH:  no palpable lymphadenopathy in the cervical, axillary or inguinal regions LUNGS: clear to auscultation b/l with normal respiratory effort HEART: regular rate & rhythm ABDOMEN:  normoactive bowel sounds , non tender, not distended. Extremity: no pedal edema PSYCH: alert & oriented x 3 with fluent speech NEURO: no focal motor/sensory deficits  Breast examination not done today. Patient reports no breast pain or tenderness and no ulcerated areas.  LABORATORY DATA:  I have reviewed the data as listed  . CBC Latest Ref Rng & Units 01/04/2018 12/07/2017 11/24/2017  WBC 3.9 - 10.3 K/uL 4.2 3.5(L) 4.2  Hemoglobin 11.6 - 15.9 g/dL 12.8 - 13.9    Hematocrit 34.8 - 46.6 % 38.7 39.5 42.0  Platelets 145 - 400 K/uL 194 205 212   . CBC    Component Value Date/Time   WBC 4.2 01/04/2018 0859   RBC 4.04 01/04/2018 0859   HGB 12.8 01/04/2018 0859   HGB 13.9 11/24/2017 1340   HCT 38.7 01/04/2018 0859   HCT 42.0 11/24/2017 1340   PLT 194 01/04/2018 0859   PLT 205 12/07/2017 0947   PLT 212 11/24/2017 1340   MCV 95.8 01/04/2018 0859   MCV 97.0 11/24/2017 1340   MCH 31.6 01/04/2018 0859   MCHC 33.0 01/04/2018 0859   RDW 14.5 01/04/2018 0859  RDW 14.1 11/24/2017 1340   LYMPHSABS 1.3 01/04/2018 0859   LYMPHSABS 1.4 11/24/2017 1340   MONOABS 0.4 01/04/2018 0859   MONOABS 0.3 11/24/2017 1340   EOSABS 0.1 01/04/2018 0859   EOSABS 0.1 11/24/2017 1340   BASOSABS 0.1 01/04/2018 0859   BASOSABS 0.1 11/24/2017 1340    . CMP Latest Ref Rng & Units 01/04/2018 12/07/2017 11/24/2017  Glucose 70 - 140 mg/dL 124 78 83  BUN 7 - 26 mg/dL 10 15 9.7  Creatinine 0.60 - 1.10 mg/dL 1.11(H) 1.25(H) 1.2(H)  Sodium 136 - 145 mmol/L 141 142 139  Potassium 3.5 - 5.1 mmol/L 3.4(L) 3.6 3.7  Chloride 98 - 109 mmol/L 107 109 -  CO2 22 - 29 mmol/L _0 Calcium 8.4 - 10.4 mg/dL 8.9 9.4 9.3  Total Protein 6.4 - 8.3 g/dL 6.6 6.8 7.1  Total Bilirubin 0.2 - 1.2 mg/dL 0.5 0.5 0.40  Alkaline Phos 40 - 150 U/L 64 62 73  AST 5 - 34 U/L _1 ALT 0 - 55 U/L 23 28 35               RADIOGRAPHIC STUDIES: I have personally reviewed the radiological images as listed and agreed with the findings in the report. No results found.  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 Multifocal tumor in the left breast with biopsy-proven left axillary metastases.   Noted to have extensive bone metastases and pulmonary metastases. Patient is noted to have calvarial metastases but no overt parenchymal metastasis She has been noted to have good  clinical response and her tumor marker levels of progressively normalized. 09/14/17 CT Chest/ad/pelvis Results discussed in details - good response.  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 Much improved back pain.  # 3 Neutropenia Related to her Ribociclib - resolved. Patient is currently on Verzenio and has not developed any neutropenia. This is being monitored.  #4 Increased ALT about 2X ULN - likely from Ribociclib - now resolved. Monitoring with Verzenio.  #5 Cancer related pain - controlled - status post palliative RT to spine - no currently needing any pain medications.  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 No other new medication. Now resolved. Monitoring on increasing doses of Verzenio. No issues with recurrent rash on Verzenio 163m po BID  #7 Grade 1 diarrhea related to her Verzenio. She notes it is controlled with Imodium when necessary. We discussed that we would need to hold the medication noted in the dose of the diarrhea worsens. Patient is hesitant to reduce the dose of this point and notes that she will monitor it.  #8 Stable creatinine to 1.1. Monitor. Patient recommended to stay well-hydrated.  Plan  -labs stable and WNL, -no prohibitive toxicities from treatment at this time. - cancer tumor markers stable. -continue on Verzenio 1544mpo BID  -Grade 1 diarrhea related to her Verzenio. She notes it is controlled with Imodium when necessary and she does not feel dose reduction to help symptoms is necessary at this time. -she will continue daily letrozole 2.5 mg. -Continue on Xgeva every 4 weeks. -continue active lifestyle with good diet and exercise. -she reports she will f/u with her psychological counselor to help with talking through some of her concerns. She notes some persistent anxiety issues and her lexapro dose was increased to 2045mo daily and  if be monitored. -Discussed next CT in April, and we will check her Vitamin D  next visit.    #6 Vitamin D deficiency up from 14 to 21.6 to 22.6 to 26.5 as of 09/05/2017. -Vitamin D level pending today -Continue ergocalciferol at current dose. 50k units 3 times a week. Recommended she take this with fatty foods to improve absorption.  PLAN -Continue Xgeva q4 weeks -Continue same dose of Ergocalciferol 3x weekly -Received Prevnar vaccine on 09/14/17 -Pneumococcal conjugate vaccine 2-3 months after Prevnar vaccination. Will schedule with next Xgeva shot.   RTC with Dr Irene Limbo in 4 weeks with labs. Continue Marchelle Folks   All of the patients questions were answered with apparent satisfaction. The patient knows to call the clinic with any problems, questions or concerns.  . The total time spent in the appointment was 20 minutes and more than 50% was on counseling and direct patient cares.     Sullivan Lone MD [place] AAHIVMS [facility] Hematology/Oncology Physician [facility]  (Office):       [phone] (Work cell):  [phone] (Fax):           [phone]  This document serves as a record of services personally performed by Sullivan Lone, MD. It was created on his behalf by Baldwin Jamaica, a trained medical scribe. The creation of this record is based on the scribe's personal observations and the provider's statements to them.   .I have reviewed the above documentation for accuracy and completeness, and I agree with the above. [name] MD MS

## 2018-01-04 ENCOUNTER — Inpatient Hospital Stay: Payer: BC Managed Care – PPO | Attending: Hematology

## 2018-01-04 ENCOUNTER — Telehealth: Payer: Self-pay

## 2018-01-04 ENCOUNTER — Inpatient Hospital Stay: Payer: BC Managed Care – PPO

## 2018-01-04 ENCOUNTER — Inpatient Hospital Stay (HOSPITAL_BASED_OUTPATIENT_CLINIC_OR_DEPARTMENT_OTHER): Payer: BC Managed Care – PPO | Admitting: Hematology

## 2018-01-04 ENCOUNTER — Other Ambulatory Visit: Payer: Self-pay | Admitting: *Deleted

## 2018-01-04 ENCOUNTER — Other Ambulatory Visit: Payer: Self-pay | Admitting: Hematology

## 2018-01-04 VITALS — BP 123/90 | HR 83 | Temp 98.8°F | Resp 18 | Ht 65.0 in | Wt 167.8 lb

## 2018-01-04 DIAGNOSIS — C50912 Malignant neoplasm of unspecified site of left female breast: Secondary | ICD-10-CM | POA: Insufficient documentation

## 2018-01-04 DIAGNOSIS — C7951 Secondary malignant neoplasm of bone: Secondary | ICD-10-CM

## 2018-01-04 DIAGNOSIS — C773 Secondary and unspecified malignant neoplasm of axilla and upper limb lymph nodes: Secondary | ICD-10-CM | POA: Diagnosis not present

## 2018-01-04 DIAGNOSIS — C7800 Secondary malignant neoplasm of unspecified lung: Secondary | ICD-10-CM | POA: Diagnosis not present

## 2018-01-04 DIAGNOSIS — Z170 Estrogen receptor positive status [ER+]: Secondary | ICD-10-CM

## 2018-01-04 DIAGNOSIS — C50919 Malignant neoplasm of unspecified site of unspecified female breast: Secondary | ICD-10-CM

## 2018-01-04 DIAGNOSIS — N6320 Unspecified lump in the left breast, unspecified quadrant: Secondary | ICD-10-CM

## 2018-01-04 DIAGNOSIS — Z7189 Other specified counseling: Secondary | ICD-10-CM

## 2018-01-04 LAB — CBC WITH DIFFERENTIAL/PLATELET
Basophils Absolute: 0.1 10*3/uL (ref 0.0–0.1)
Basophils Relative: 2 %
Eosinophils Absolute: 0.1 10*3/uL (ref 0.0–0.5)
Eosinophils Relative: 2 %
HEMATOCRIT: 38.7 % (ref 34.8–46.6)
HEMOGLOBIN: 12.8 g/dL (ref 11.6–15.9)
LYMPHS ABS: 1.3 10*3/uL (ref 0.9–3.3)
Lymphocytes Relative: 31 %
MCH: 31.6 pg (ref 25.1–34.0)
MCHC: 33 g/dL (ref 31.5–36.0)
MCV: 95.8 fL (ref 79.5–101.0)
MONOS PCT: 10 %
Monocytes Absolute: 0.4 10*3/uL (ref 0.1–0.9)
NEUTROS ABS: 2.4 10*3/uL (ref 1.5–6.5)
NEUTROS PCT: 55 %
Platelets: 194 10*3/uL (ref 145–400)
RBC: 4.04 MIL/uL (ref 3.70–5.45)
RDW: 14.5 % (ref 11.2–14.5)
WBC: 4.2 10*3/uL (ref 3.9–10.3)

## 2018-01-04 LAB — COMPREHENSIVE METABOLIC PANEL
ALBUMIN: 3.5 g/dL (ref 3.5–5.0)
ALT: 23 U/L (ref 0–55)
ANION GAP: 12 — AB (ref 3–11)
AST: 21 U/L (ref 5–34)
Alkaline Phosphatase: 64 U/L (ref 40–150)
BUN: 10 mg/dL (ref 7–26)
CHLORIDE: 107 mmol/L (ref 98–109)
CO2: 22 mmol/L (ref 22–29)
Calcium: 8.9 mg/dL (ref 8.4–10.4)
Creatinine, Ser: 1.11 mg/dL — ABNORMAL HIGH (ref 0.60–1.10)
GFR calc non Af Amer: 52 mL/min — ABNORMAL LOW (ref >60)
Glucose, Bld: 124 mg/dL (ref 70–140)
Potassium: 3.4 mmol/L — ABNORMAL LOW (ref 3.5–5.1)
SODIUM: 141 mmol/L (ref 136–145)
Total Bilirubin: 0.5 mg/dL (ref 0.2–1.2)
Total Protein: 6.6 g/dL (ref 6.4–8.3)

## 2018-01-04 MED ORDER — ESCITALOPRAM OXALATE 10 MG PO TABS: 10.0000 mg | ORAL_TABLET | Freq: Every day | ORAL | 2 refills | Status: DC

## 2018-01-04 MED ORDER — DENOSUMAB 120 MG/1.7ML ~~LOC~~ SOLN
120.0000 mg | Freq: Once | SUBCUTANEOUS | Status: AC
  Administered 2018-01-04: 120 mg via SUBCUTANEOUS

## 2018-01-04 MED ORDER — DENOSUMAB 120 MG/1.7ML ~~LOC~~ SOLN
SUBCUTANEOUS | Status: AC
  Filled 2018-01-04: qty 1.7

## 2018-01-04 MED ORDER — ESCITALOPRAM OXALATE 20 MG PO TABS
20.0000 mg | ORAL_TABLET | Freq: Every day | ORAL | 1 refills | Status: DC
Start: 2018-01-04 — End: 2018-02-24

## 2018-01-04 NOTE — Telephone Encounter (Signed)
Printed avs and calender for upcoming appointment. Per 2/13 los 

## 2018-01-04 NOTE — Patient Instructions (Signed)

## 2018-01-06 ENCOUNTER — Other Ambulatory Visit: Payer: Self-pay | Admitting: Pharmacist

## 2018-01-18 ENCOUNTER — Encounter: Payer: Self-pay | Admitting: Hematology

## 2018-01-18 NOTE — Progress Notes (Signed)
Received PA request for Ondansetron.  Called [facility](Dora G) to initiate auth. Message was refill too soon and not eligible to refill til 01/21/18. She ran test claim for 01/21/18 and it still required PA.  Answered questions. PA approved 01/18/18 - 07/18/18 [id number] but will not pay out til 01/21/18. Patient must wait til 01/21/18 to pick up in order for claim to pay.

## 2018-01-19 ENCOUNTER — Other Ambulatory Visit: Payer: Self-pay

## 2018-01-19 ENCOUNTER — Telehealth: Payer: Self-pay

## 2018-01-19 DIAGNOSIS — R110 Nausea: Secondary | ICD-10-CM

## 2018-01-19 MED ORDER — HYDROXYZINE HCL 10 MG PO TABS: 25.0000 mg | ORAL_TABLET | Freq: Two times a day (BID) | ORAL | 1 refills | Status: DC | PRN

## 2018-01-19 MED ORDER — HYDROXYZINE HCL 25 MG PO TABS: 25.0000 mg | ORAL_TABLET | Freq: Two times a day (BID) | ORAL | Status: DC | PRN

## 2018-01-19 NOTE — Telephone Encounter (Signed)
Pt arrived in lobby this afternoon with c/o nausea and vomiting post verzenio dose this AM. Last night pt took verzenio without any nausea or vomiting. Pt relayed that she had taken ativan with it, which I explained could also help with nausea. Per Dr. Irene Limbo, pt may take ativan every 6 hours prn for nausea and additional prescription sent to [facility] on [address] for hydroxyzine 25mg  twice daily as needed for nausea. Pt grateful for help and verbalized understanding of plan until able to acquire zofran based on insurance regulations.  Spoke with associate at [facility] on [address] to confirm 25mg  tablet available. Ordering only allowed 10mg  tablet, but dosage changed to 25mg .

## 2018-01-20 ENCOUNTER — Other Ambulatory Visit: Payer: Self-pay | Admitting: Hematology

## 2018-01-20 DIAGNOSIS — C50919 Malignant neoplasm of unspecified site of unspecified female breast: Secondary | ICD-10-CM

## 2018-01-23 ENCOUNTER — Other Ambulatory Visit: Payer: Self-pay | Admitting: Hematology

## 2018-01-23 ENCOUNTER — Telehealth: Payer: Self-pay | Admitting: *Deleted

## 2018-01-23 NOTE — Telephone Encounter (Signed)
"  Working with [employer] to file an appeal for the ondansetron medication.  How many pills were ordered?  More information is needed for this to be authorized.  Received only eighteen pills this weekend."  Quantity sixty Ondansetron 4 mg tablets ordered twice daily for use with oral chemotherapy agent Verzenio 150 mg tablets twice daily for management of N/V.  Call transferred to Drug Replacement/financial advocate.

## 2018-01-24 ENCOUNTER — Other Ambulatory Visit: Payer: Self-pay | Admitting: *Deleted

## 2018-01-24 ENCOUNTER — Encounter: Payer: Self-pay | Admitting: Hematology

## 2018-01-24 DIAGNOSIS — C50919 Malignant neoplasm of unspecified site of unspecified female breast: Secondary | ICD-10-CM

## 2018-01-24 MED ORDER — ONDANSETRON HCL 4 MG PO TABS: 4.0000 mg | ORAL_TABLET | Freq: Two times a day (BID) | ORAL | 0 refills | Status: DC

## 2018-01-24 MED ORDER — LETROZOLE 2.5 MG PO TABS: ORAL_TABLET | ORAL | 0 refills | Status: DC

## 2018-01-24 NOTE — Progress Notes (Signed)
Received in basket from RN regarding Ondansetron amount.  Called [facility](Susan J) to follow up on PA amount/approval.  She states that initially the pharmacy ran the claim on 01/21/18 for 18 for 21 days which is why that is all she received. I clarified that it should have been 60 for 30 days. She advised that an new rx for 60 for 30 days would need to be sent to the pharmacy and it would reject. Once rejected, the pharmacy would need to call [facility] to do an override and it will go through as written.  Called Lauren RN to make aware and she states she will take care of it.  Called patient to advise and she was very Patent attorney. She also wanted me to make RN aware she only had one Letrozole left and needed more. I advised her I would.  Called Lauren to make aware of Letrozole request. She states she had already taken care of it.

## 2018-01-30 MED FILL — VERZENIO 150 MG TAB: 150 | 28 days supply | Qty: 56 | Fill #0

## 2018-01-30 NOTE — Progress Notes (Signed)
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  INTERVAL HISTORY   Stacey Cox is here for follow-up of her metastatic hormone positive HER-2 negative breast cancer. The patient's last visit with [place] was on 01/04/18. The pt reports that she is doing well overall and enjoyed her recent trip to [facility]. 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 She is looking forward to a trip to [place] in the next month as well.   She notes that she needs a refill of her Ondansetron.  She notes that she had been taking 1 pill of Hydroxyzine 54m instead of 2.5 pills each day; she notes taking her Vitamin D twice each week.   She notes that she continues to take Verzenio and is tolerating the increased Lexapro dose well, and has not needed to take Ativan every night.   Lab results today (02/01/18) of CBC, CMP, and Reticulocytes is as follows: all values are WNL except for RDW at 15.0, Potassium at 3.1, Creatinine at 1.18, and Albumin at 3.4. CA 15-3 and 27.29 from today, 02/01/18 are pending. Vitamin D 25 hydroxy 02/01/18 are 23.1  On review of systems, pt reports stable diarrhea that isn't too bothersome, intermittent mild headaches, and denies any breast symptoms, problems passing urine, migraines, significant back pain, mouth sores, skin rashes, fevers, chills, abdominal pains, and any other symptoms.    MEDICAL HISTORY:  Past Medical History:  Diagnosis Date  . Cancer ([facility]    Metastatic Breast Cancer    SURGICAL HISTORY: Past Surgical History:  Procedure Laterality Date  . CHOLECYSTECTOMY    . TONSILLECTOMY      SOCIAL HISTORY: Social History   Socioeconomic History  . Marital  status: Single    Spouse name: Not on file  . Number of children: Not on file  . Years of education: Not on file  . Highest education level: Not on file  Social Needs  . Financial resource strain: Not on file  . Food insecurity - worry: Not on file  . Food insecurity - inability: Not on file  . Transportation needs - medical: Not on file  . Transportation needs - non-medical: Not on file  Occupational History  . Not on file  Tobacco Use  . Smoking status: Former Smoker    Packs/day: 0.00    Types: Cigarettes    Last attempt to quit: 01/21/2017    Years since quitting: 1.0  . Smokeless tobacco: Never Used  Substance and Sexual Activity  . Alcohol use: No    Alcohol/week: 0.0 oz  . Drug use: No  . Sexual activity: No  Other Topics Concern  . Not on file  Social History Narrative  . 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 Cancer Mother        breast  . Cancer Sister        breast  Mother with h/o breast cancer in her 624'sSister with breast cancer in her 529ysand later was diagnosed with multiple myeloma. (Patient is not  aware of any specific breast cancer mutations present)  ALLERGIES:  is allergic to thorazine [chlorpromazine] and ribociclib.  MEDICATIONS:  Current Outpatient Medications  Medication Sig Dispense Refill  . aspirin EC 81 MG tablet Take 1 tablet (81 mg total) by mouth daily.    . ergocalciferol (VITAMIN D2) 50000 units capsule Take one capsule by mouth three times weekly. 24 capsule 3  . escitalopram (LEXAPRO) 20 MG tablet Take 1 tablet (20 mg total) by mouth daily. 30 tablet 1  . HYDROcodone-acetaminophen (NORCO/VICODIN) 5-325 MG tablet Take 1-2 tablets by mouth every 4 (four) hours as needed for moderate pain or severe pain. 20 tablet 0  . hydrOXYzine (ATARAX/VISTARIL) 10 MG tablet Take 2.5 tablets (25  mg total) by mouth 2 (two) times daily as needed for nausea or vomiting. 30 tablet 1  . ibuprofen (ADVIL,MOTRIN) 600 MG tablet Take 1 tablet (600 mg total) by mouth every 6 (six) hours as needed. 30 tablet 0  . letrozole (FEMARA) 2.5 MG tablet TAKE 1 TABLET(2.5 MG) BY MOUTH DAILY 30 tablet 0  . loperamide (IMODIUM) 1 MG/5ML solution Take 2 mg by mouth as needed for diarrhea or loose stools.     Stacey Cox Kitchen LORazepam (ATIVAN) 0.5 MG tablet TAKE 1 TABLET BY MOUTH EVERY 6 HOURS AS NEEDED FOR ANXIETY 60 tablet 0  . ondansetron (ZOFRAN) 4 MG tablet Take 1 tablet (4 mg total) by mouth 2 (two) times daily. Take with Verzenio. 60 tablet 0  . VERZENIO 150 MG tablet TAKE 1 TABLET (150 MG TOTAL) BY MOUTH 2 TIMES DAILY. 56 tablet 0   No current facility-administered medications for this visit.     REVIEW OF SYSTEMS:    .10 Point review of Systems was done is negative except as noted above.   PHYSICAL EXAMINATION:  ECOG PERFORMANCE STATUS: 1 - Symptomatic but completely ambulatory  . Vitals:   02/01/18 0917  BP: 108/79  Pulse: 62  Temp: 98.1 F (36.7 C)  SpO2: 100%   Filed Weights   02/01/18 0917  Weight: 166 lb 4.8 oz (75.4 kg)   .Body mass index is 27.67 kg/m.  Stacey Cox Kitchen GENERAL:alert, in no acute distress and comfortable SKIN: no acute rashes, no significant lesions EYES: conjunctiva are pink and non-injected, sclera anicteric OROPHARYNX: MMM, no exudates, no oropharyngeal erythema or ulceration NECK: supple, no JVD LYMPH:  no palpable lymphadenopathy in the cervical, axillary or inguinal regions LUNGS: clear to auscultation b/l with normal respiratory effort HEART: regular rate & rhythm ABDOMEN:  normoactive bowel sounds , non tender, not distended. Extremity: no pedal edema PSYCH: alert & oriented x 3 with fluent speech NEURO: no focal motor/sensory deficits    LABORATORY DATA:  I have reviewed the data as listed  . CBC Latest Ref Rng & Units 02/01/2018 01/04/2018 12/07/2017  WBC 3.9 -  10.3 K/uL 4.4 4.2 3.5(L)  Hemoglobin 11.6 - 15.9 g/dL - 12.8 -  Hematocrit 34.8 - 46.6 % 38.7 38.7 39.5  Platelets 145 - 400 K/uL 191 194 205  HGB 12.9 . CBC    Component Value Date/Time   WBC 4.4 02/01/2018 0900   WBC 4.2 01/04/2018 0859   RBC 4.03 02/01/2018 0900   RBC 4.03 02/01/2018 0900   HGB 12.8 01/04/2018 0859   HGB 13.9 11/24/2017 1340   HCT 38.7 02/01/2018 0900   HCT 42.0 11/24/2017 1340   PLT 191 02/01/2018 0900   PLT 212 11/24/2017 1340   MCV 96.0 02/01/2018 0900   MCV 97.0 11/24/2017 1340  MCH 32.0 02/01/2018 0900   MCHC 33.3 02/01/2018 0900   RDW 15.0 (H) 02/01/2018 0900   RDW 14.1 11/24/2017 1340   LYMPHSABS 1.5 02/01/2018 0900   LYMPHSABS 1.4 11/24/2017 1340   MONOABS 0.3 02/01/2018 0900   MONOABS 0.3 11/24/2017 1340   EOSABS 0.1 02/01/2018 0900   EOSABS 0.1 11/24/2017 1340   BASOSABS 0.1 02/01/2018 0900   BASOSABS 0.1 11/24/2017 1340    . CMP Latest Ref Rng & Units 01/04/2018 12/07/2017 11/24/2017  Glucose 70 - 140 mg/dL 124 78 83  BUN 7 - 26 mg/dL 10 15 9.7  Creatinine 0.60 - 1.10 mg/dL 1.11(H) 1.25(H) 1.2(H)  Sodium 136 - 145 mmol/L 141 142 139  Potassium 3.5 - 5.1 mmol/L 3.4(L) 3.6 3.7  Chloride 98 - 109 mmol/L 107 109 -  CO2 22 - 29 mmol/L _0 Calcium 8.4 - 10.4 mg/dL 8.9 9.4 9.3  Total Protein 6.4 - 8.3 g/dL 6.6 6.8 7.1  Total Bilirubin 0.2 - 1.2 mg/dL 0.5 0.5 0.40  Alkaline Phos 40 - 150 U/L 64 62 73  AST 5 - 34 U/L _1 ALT 0 - 55 U/L 23 28 35               RADIOGRAPHIC STUDIES: I have personally reviewed the radiological images as listed and agreed with the findings in the report. No results found.  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 Multifocal tumor in the left breast with biopsy-proven left axillary metastases.   Noted to have extensive bone metastases and pulmonary metastases. Patient was  noted to have calvarial metastases but no overt parenchymal metastasis She has been noted to have good clinical response and her tumor marker levels of progressively normalized. 09/14/17 CT Chest/ad/pelvis Results discussed in details - good response.  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 Much improved back pain.  # 3 Neutropenia Related to her Ribociclib - resolved. Patient is currently on Verzenio and has not developed any neutropenia. This is being monitored.  #4 Increased ALT about 2X ULN - likely from Ribociclib - now resolved. Monitoring with Verzenio.  #5 Cancer related pain - controlled - status post palliative RT to spine - no currently needing any pain medications.  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 No other new medication. Now resolved. Monitoring on increasing doses of Verzenio. No issues with recurrent rash on Verzenio 180m po BID  #7 Grade 1 diarrhea related to her Verzenio. She notes it is controlled with Imodium when necessary. We discussed that we would need to hold the medication noted in the dose of the diarrhea worsens. Patient is hesitant to reduce the dose of this point and notes that she will monitor it.  #8 Stable creatinine to 1.1. Monitor. Patient recommended to stay well-hydrated.  Plan  -continue on Verzenio 1518mpo BID, no prohibitive toxicities at this time.  -Grade 1 diarrhea related to her Verzenio. She notes it is controlled with Imodium when necessary and she does not feel dose reduction to help symptoms is necessary at this time. -she will continue daily letrozole 2.5 mg. -Continue on Xgeva every 4 weeks. -continue active lifestyle with good diet and exercise. -she reports she will f/u with her psychological counselor to help with talking through some of her concerns.  -She is tolerating her increased dose of 2035m  Lexapro well and notes she is feeling better -Discussed pt labwork today; blood counts are  stable and normal except RDW at 15.  -CA15-3 and 27.29 are pending today but have been stable -We will give Xgeva shot today as scheduled -Discussed getting her repeat CT scan in April.  -Pt has had both of her Pneumonia vaccines.    #6 Vitamin D deficiency up from 14 to 21.6 to 22.6 to 26.5 as of 09/05/2017. -Vitamin D level pending today -Continue ergocalciferol at current dose. 50k units 3 times a week. Recommended she take this with fatty foods to improve absorption.   continue Xgeva q4weeks -CT chest/abd/pelvis in 25 days (prior to appointment) -RTC with Dr Irene Limbo with labs in 4 weeks per appointment scheduled already   All of the patients questions were answered with apparent satisfaction. The patient knows to call the clinic with any problems, questions or concerns.  . The total time spent in the appointment was 20 minutes and more than 50% was on counseling and direct patient cares.      Sullivan Lone MD [place] AAHIVMS [facility] Hematology/Oncology Physician [facility]  (Office):       [phone] (Work cell):  [phone] (Fax):           [phone]  This document serves as a record of services personally performed by Sullivan Lone, MD. It was created on his behalf by Baldwin Jamaica, a trained medical scribe. The creation of this record is based on the scribe's personal observations and the provider's statements to them.   .I have reviewed the above documentation for accuracy and completeness, and I agree with the above. [name] MD MS

## 2018-02-01 ENCOUNTER — Inpatient Hospital Stay (HOSPITAL_BASED_OUTPATIENT_CLINIC_OR_DEPARTMENT_OTHER): Payer: BC Managed Care – PPO | Admitting: Hematology

## 2018-02-01 ENCOUNTER — Inpatient Hospital Stay: Payer: BC Managed Care – PPO | Attending: Hematology

## 2018-02-01 ENCOUNTER — Telehealth: Payer: Self-pay

## 2018-02-01 ENCOUNTER — Ambulatory Visit: Payer: BC Managed Care – PPO

## 2018-02-01 ENCOUNTER — Other Ambulatory Visit: Payer: BC Managed Care – PPO

## 2018-02-01 ENCOUNTER — Encounter: Payer: Self-pay | Admitting: Hematology

## 2018-02-01 ENCOUNTER — Inpatient Hospital Stay: Payer: BC Managed Care – PPO

## 2018-02-01 VITALS — BP 108/79 | HR 62 | Temp 98.1°F | Ht 65.0 in | Wt 166.3 lb

## 2018-02-01 DIAGNOSIS — Z170 Estrogen receptor positive status [ER+]: Secondary | ICD-10-CM | POA: Insufficient documentation

## 2018-02-01 DIAGNOSIS — G893 Neoplasm related pain (acute) (chronic): Secondary | ICD-10-CM

## 2018-02-01 DIAGNOSIS — C773 Secondary and unspecified malignant neoplasm of axilla and upper limb lymph nodes: Secondary | ICD-10-CM | POA: Insufficient documentation

## 2018-02-01 DIAGNOSIS — C7951 Secondary malignant neoplasm of bone: Secondary | ICD-10-CM | POA: Diagnosis present

## 2018-02-01 DIAGNOSIS — L26 Exfoliative dermatitis: Secondary | ICD-10-CM

## 2018-02-01 DIAGNOSIS — C50812 Malignant neoplasm of overlapping sites of left female breast: Secondary | ICD-10-CM | POA: Diagnosis not present

## 2018-02-01 DIAGNOSIS — C7800 Secondary malignant neoplasm of unspecified lung: Secondary | ICD-10-CM | POA: Insufficient documentation

## 2018-02-01 DIAGNOSIS — E876 Hypokalemia: Secondary | ICD-10-CM

## 2018-02-01 DIAGNOSIS — C50919 Malignant neoplasm of unspecified site of unspecified female breast: Secondary | ICD-10-CM

## 2018-02-01 DIAGNOSIS — R197 Diarrhea, unspecified: Secondary | ICD-10-CM | POA: Insufficient documentation

## 2018-02-01 DIAGNOSIS — Z7189 Other specified counseling: Secondary | ICD-10-CM

## 2018-02-01 LAB — CMP (CANCER CENTER ONLY)
ALBUMIN: 3.4 g/dL — AB (ref 3.5–5.0)
ALT: 32 U/L (ref 0–55)
AST: 25 U/L (ref 5–34)
Alkaline Phosphatase: 64 U/L (ref 40–150)
Anion gap: 8 (ref 3–11)
BUN: 11 mg/dL (ref 7–26)
CHLORIDE: 105 mmol/L (ref 98–109)
CO2: 27 mmol/L (ref 22–29)
CREATININE: 1.18 mg/dL — AB (ref 0.60–1.10)
Calcium: 9.2 mg/dL (ref 8.4–10.4)
GFR, Est AFR Am: 56 mL/min — ABNORMAL LOW (ref >60)
GFR, Estimated: 48 mL/min — ABNORMAL LOW (ref >60)
GLUCOSE: 120 mg/dL (ref 70–140)
Potassium: 3.1 mmol/L — ABNORMAL LOW (ref 3.5–5.1)
SODIUM: 140 mmol/L (ref 136–145)
Total Bilirubin: 0.4 mg/dL (ref 0.2–1.2)
Total Protein: 6.6 g/dL (ref 6.4–8.3)

## 2018-02-01 LAB — CBC WITH DIFFERENTIAL (CANCER CENTER ONLY)
Basophils Absolute: 0.1 10*3/uL (ref 0.0–0.1)
Basophils Relative: 1 %
EOS ABS: 0.1 10*3/uL (ref 0.0–0.5)
EOS PCT: 1 %
HCT: 38.7 % (ref 34.8–46.6)
Hemoglobin: 12.9 g/dL (ref 11.6–15.9)
LYMPHS ABS: 1.5 10*3/uL (ref 0.9–3.3)
Lymphocytes Relative: 33 %
MCH: 32 pg (ref 25.1–34.0)
MCHC: 33.3 g/dL (ref 31.5–36.0)
MCV: 96 fL (ref 79.5–101.0)
MONOS PCT: 8 %
Monocytes Absolute: 0.3 10*3/uL (ref 0.1–0.9)
Neutro Abs: 2.5 10*3/uL (ref 1.5–6.5)
Neutrophils Relative %: 57 %
PLATELETS: 191 10*3/uL (ref 145–400)
RBC: 4.03 MIL/uL (ref 3.70–5.45)
RDW: 15 % — ABNORMAL HIGH (ref 11.2–14.5)
WBC Count: 4.4 10*3/uL (ref 3.9–10.3)

## 2018-02-01 LAB — RETICULOCYTES
RBC.: 4.03 MIL/uL (ref 3.70–5.45)
RETIC CT PCT: 1.9 % (ref 0.7–2.1)
Retic Count, Absolute: 76.6 10*3/uL (ref 33.7–90.7)

## 2018-02-01 MED ORDER — POTASSIUM CHLORIDE CRYS ER 20 MEQ PO TBCR: 20.0000 meq | EXTENDED_RELEASE_TABLET | Freq: Two times a day (BID) | ORAL | 0 refills | Status: DC

## 2018-02-01 MED ORDER — DENOSUMAB 120 MG/1.7ML ~~LOC~~ SOLN
SUBCUTANEOUS | Status: AC
  Filled 2018-02-01: qty 1.7

## 2018-02-01 MED ORDER — DENOSUMAB 120 MG/1.7ML ~~LOC~~ SOLN
120.0000 mg | Freq: Once | SUBCUTANEOUS | Status: AC
  Administered 2018-02-01: 120 mg via SUBCUTANEOUS

## 2018-02-01 NOTE — Patient Instructions (Signed)
Pneumococcal Vaccine, Polyvalent solution for injection What is this medicine? PNEUMOCOCCAL VACCINE, POLYVALENT (NEU mo KOK al vak SEEN, pol ee VEY luhnt) is a vaccine to prevent pneumococcus bacteria infection. These bacteria are a major cause of ear infections, Strep throat infections, and serious pneumonia, meningitis, or blood infections worldwide. These vaccines help the body to produce antibodies (protective substances) that help your body defend against these bacteria. This vaccine is recommended for people 61 years of age and older with health problems. It is also recommended for all adults over 73 years old. This vaccine will not treat an infection. This medicine may be used for other purposes; ask your health care provider or pharmacist if you have questions. COMMON BRAND NAME(S): Pneumovax 23 What should I tell my health care provider before I take this medicine? They need to know if you have any of these conditions: -bleeding problems -bone marrow or organ transplant -cancer, Hodgkin's disease -fever -infection -immune system problems -low platelet count in the blood -seizures -an unusual or allergic reaction to pneumococcal vaccine, diphtheria toxoid, other vaccines, latex, other medicines, foods, dyes, or preservatives -pregnant or trying to get pregnant -breast-feeding How should I use this medicine? This vaccine is for injection into a muscle or under the skin. It is given by a health care professional. A copy of Vaccine Information Statements will be given before each vaccination. Read this sheet carefully each time. The sheet may change frequently. Talk to your pediatrician regarding the use of this medicine in children. While this drug may be prescribed for children as young as 63 years of age for selected conditions, precautions do apply. Overdosage: If you think you have taken too much of this medicine contact a poison control center or emergency room at once. NOTE: This  medicine is only for you. Do not share this medicine with others. What if I miss a dose? It is important not to miss your dose. Call your doctor or health care professional if you are unable to keep an appointment. What may interact with this medicine? -medicines for cancer chemotherapy -medicines that suppress your immune function -medicines that treat or prevent blood clots like warfarin, enoxaparin, and dalteparin -steroid medicines like prednisone or cortisone This list may not describe all possible interactions. Give your health care provider a list of all the medicines, herbs, non-prescription drugs, or dietary supplements you use. Also tell them if you smoke, drink alcohol, or use illegal drugs. Some items may interact with your medicine. What should I watch for while using this medicine? Mild fever and pain should go away in 3 days or less. Report any unusual symptoms to your doctor or health care professional. What side effects may I notice from receiving this medicine? Side effects that you should report to your doctor or health care professional as soon as possible: -allergic reactions like skin rash, itching or hives, swelling of the face, lips, or tongue -breathing problems -confused -fever over 102 degrees F -pain, tingling, numbness in the hands or feet -seizures -unusual bleeding or bruising -unusual muscle weakness Side effects that usually do not require medical attention (report to your doctor or health care professional if they continue or are bothersome): -aches and pains -diarrhea -fever of 102 degrees F or less -headache -irritable -loss of appetite -pain, tender at site where injected -trouble sleeping This list may not describe all possible side effects. Call your doctor for medical advice about side effects. You may report side effects to FDA at 1-800-FDA-1088. Where should  I keep my medicine? This does not apply. This vaccine is given in a clinic, pharmacy,  doctor's office, or other health care setting and will not be stored at home. NOTE: This sheet is a summary. It may not cover all possible information. If you have questions about this medicine, talk to your doctor, pharmacist, or health care provider.  2018 Elsevier/Gold Standard (2008-06-14 14:32:37) Denosumab injection What is this medicine? DENOSUMAB (den oh sue mab) slows bone breakdown. Prolia is used to treat osteoporosis in women after menopause and in men. Delton See is used to treat a high calcium level due to cancer and to prevent bone fractures and other bone problems caused by multiple myeloma or cancer bone metastases. Delton See is also used to treat giant cell tumor of the bone. This medicine may be used for other purposes; ask your health care provider or pharmacist if you have questions. COMMON BRAND NAME(S): Prolia, XGEVA What should I tell my health care provider before I take this medicine? They need to know if you have any of these conditions: -dental disease -having surgery or tooth extraction -infection -kidney disease -low levels of calcium or Vitamin D in the blood -malnutrition -on hemodialysis -skin conditions or sensitivity -thyroid or parathyroid disease -an unusual reaction to denosumab, other medicines, foods, dyes, or preservatives -pregnant or trying to get pregnant -breast-feeding How should I use this medicine? This medicine is for injection under the skin. It is given by a health care professional in a hospital or clinic setting. If you are getting Prolia, a special MedGuide will be given to you by the pharmacist with each prescription and refill. Be sure to read this information carefully each time. For Prolia, talk to your pediatrician regarding the use of this medicine in children. Special care may be needed. For Delton See, talk to your pediatrician regarding the use of this medicine in children. While this drug may be prescribed for children as young as 13 years  for selected conditions, precautions do apply. Overdosage: If you think you have taken too much of this medicine contact a poison control center or emergency room at once. NOTE: This medicine is only for you. Do not share this medicine with others. What if I miss a dose? It is important not to miss your dose. Call your doctor or health care professional if you are unable to keep an appointment. What may interact with this medicine? Do not take this medicine with any of the following medications: -other medicines containing denosumab This medicine may also interact with the following medications: -medicines that lower your chance of fighting infection -steroid medicines like prednisone or cortisone This list may not describe all possible interactions. Give your health care provider a list of all the medicines, herbs, non-prescription drugs, or dietary supplements you use. Also tell them if you smoke, drink alcohol, or use illegal drugs. Some items may interact with your medicine. What should I watch for while using this medicine? Visit your doctor or health care professional for regular checks on your progress. Your doctor or health care professional may order blood tests and other tests to see how you are doing. Call your doctor or health care professional for advice if you get a fever, chills or sore throat, or other symptoms of a cold or flu. Do not treat yourself. This drug may decrease your body's ability to fight infection. Try to avoid being around people who are sick. You should make sure you get enough calcium and vitamin D while you  are taking this medicine, unless your doctor tells you not to. Discuss the foods you eat and the vitamins you take with your health care professional. See your dentist regularly. Brush and floss your teeth as directed. Before you have any dental work done, tell your dentist you are receiving this medicine. Do not become pregnant while taking this medicine or for  5 months after stopping it. Talk with your doctor or health care professional about your birth control options while taking this medicine. Women should inform their doctor if they wish to become pregnant or think they might be pregnant. There is a potential for serious side effects to an unborn child. Talk to your health care professional or pharmacist for more information. What side effects may I notice from receiving this medicine? Side effects that you should report to your doctor or health care professional as soon as possible: -allergic reactions like skin rash, itching or hives, swelling of the face, lips, or tongue -bone pain -breathing problems -dizziness -jaw pain, especially after dental work -redness, blistering, peeling of the skin -signs and symptoms of infection like fever or chills; cough; sore throat; pain or trouble passing urine -signs of low calcium like fast heartbeat, muscle cramps or muscle pain; pain, tingling, numbness in the hands or feet; seizures -unusual bleeding or bruising -unusually weak or tired Side effects that usually do not require medical attention (report to your doctor or health care professional if they continue or are bothersome): -constipation -diarrhea -headache -joint pain -loss of appetite -muscle pain -runny nose -tiredness -upset stomach This list may not describe all possible side effects. Call your doctor for medical advice about side effects. You may report side effects to FDA at 1-800-FDA-1088. Where should I keep my medicine? This medicine is only given in a clinic, doctor's office, or other health care setting and will not be stored at home. NOTE: This sheet is a summary. It may not cover all possible information. If you have questions about this medicine, talk to your doctor, pharmacist, or health care provider.  2018 Elsevier/Gold Standard (2016-11-30 19:17:21)

## 2018-02-01 NOTE — Telephone Encounter (Signed)
Called pt to notify her of K of 3.1 today. Prescription sent to [facility] for potassium chloride 82mEq PO to be taken twice daily. Pt to begin taking this prescription as directed and increase potassium-rich foods in diet. If pt has any questions, [facility] main line provided [phone].

## 2018-02-02 LAB — VITAMIN D 25 HYDROXY (VIT D DEFICIENCY, FRACTURES): Vit D, 25-Hydroxy: 23.1 ng/mL — ABNORMAL LOW (ref 30.0–100.0)

## 2018-02-02 LAB — CANCER ANTIGEN 15-3: CAN 15 3: 15.3 U/mL (ref 0.0–25.0)

## 2018-02-02 LAB — CANCER ANTIGEN 27.29: CA 27.29: 25.2 U/mL (ref 0.0–38.6)

## 2018-02-03 ENCOUNTER — Telehealth: Payer: Self-pay

## 2018-02-03 NOTE — Telephone Encounter (Signed)
Spoke with patient concerning upcoming appointment, and ask her to back in for contrast. She did not wait on scheduling. She said that it was 5 others in front of her.

## 2018-02-19 ENCOUNTER — Other Ambulatory Visit: Payer: Self-pay | Admitting: Hematology

## 2018-02-19 DIAGNOSIS — C50919 Malignant neoplasm of unspecified site of unspecified female breast: Secondary | ICD-10-CM

## 2018-02-20 ENCOUNTER — Other Ambulatory Visit: Payer: Self-pay

## 2018-02-20 ENCOUNTER — Other Ambulatory Visit: Payer: Self-pay | Admitting: Hematology

## 2018-02-20 DIAGNOSIS — C50919 Malignant neoplasm of unspecified site of unspecified female breast: Secondary | ICD-10-CM

## 2018-02-20 MED ORDER — LETROZOLE 2.5 MG PO TABS: ORAL_TABLET | ORAL | 0 refills | Status: DC

## 2018-02-21 ENCOUNTER — Other Ambulatory Visit: Payer: Self-pay

## 2018-02-21 MED ORDER — ABEMACICLIB 150 MG PO TABS: ORAL_TABLET | ORAL | 0 refills | Status: DC

## 2018-02-22 ENCOUNTER — Ambulatory Visit (HOSPITAL_COMMUNITY)
Admission: RE | Admit: 2018-02-22 | Discharge: 2018-02-22 | Disposition: A | Discharge status: Home / Self Care | Payer: BC Managed Care – PPO | Source: Ambulatory Visit | Attending: Hematology | Admitting: Hematology

## 2018-02-22 ENCOUNTER — Encounter (HOSPITAL_COMMUNITY): Payer: Self-pay | Admitting: Radiology

## 2018-02-22 DIAGNOSIS — C7951 Secondary malignant neoplasm of bone: Secondary | ICD-10-CM | POA: Diagnosis not present

## 2018-02-22 DIAGNOSIS — K769 Liver disease, unspecified: Secondary | ICD-10-CM | POA: Insufficient documentation

## 2018-02-22 DIAGNOSIS — K5730 Diverticulosis of large intestine without perforation or abscess without bleeding: Secondary | ICD-10-CM | POA: Insufficient documentation

## 2018-02-22 DIAGNOSIS — C50919 Malignant neoplasm of unspecified site of unspecified female breast: Secondary | ICD-10-CM | POA: Insufficient documentation

## 2018-02-22 DIAGNOSIS — I700 Atherosclerosis of aorta: Secondary | ICD-10-CM | POA: Diagnosis not present

## 2018-02-22 DIAGNOSIS — D739 Disease of spleen, unspecified: Secondary | ICD-10-CM | POA: Diagnosis not present

## 2018-02-22 DIAGNOSIS — K760 Fatty (change of) liver, not elsewhere classified: Secondary | ICD-10-CM | POA: Diagnosis not present

## 2018-02-22 DIAGNOSIS — K229 Disease of esophagus, unspecified: Secondary | ICD-10-CM | POA: Diagnosis not present

## 2018-02-22 DIAGNOSIS — C7800 Secondary malignant neoplasm of unspecified lung: Secondary | ICD-10-CM | POA: Diagnosis not present

## 2018-02-22 MED ORDER — IOPAMIDOL (ISOVUE-300) INJECTION 61%
INTRAVENOUS | Status: AC
  Filled 2018-02-22: qty 100

## 2018-02-22 MED ORDER — IOPAMIDOL (ISOVUE-300) INJECTION 61%
100.0000 mL | Freq: Once | INTRAVENOUS | Status: AC | PRN
  Administered 2018-02-22: 100 mL via INTRAVENOUS

## 2018-02-23 MED FILL — VERZENIO 150 MG TAB: 150 | 28 days supply | Qty: 56 | Fill #0

## 2018-02-24 ENCOUNTER — Other Ambulatory Visit: Payer: Self-pay | Admitting: Hematology

## 2018-02-27 NOTE — Progress Notes (Signed)
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  INTERVAL HISTORY   Stacey Cox is here for follow-up of her metastatic hormone positive HER-2 negative breast cancer. The patient's last visit with [place] was on 02/01/18. She is accompanied today by 4 members of her family. The pt reports that she is doing well overall and enjoyed her recent trip to [place].   The pt reports that she has no new concerns. She notes that she has been consuming much dietary potassium as recommended from our last visit.  Of note since the patient's last visit, pt has had a CT C/A/P completed on 02/22/18 with results revealing 1. Newly apparent 2.1 by 2.0 cm rim enhancing lesion in the right hepatic lobe suspicious for a metastatic lesion. This was not apparent on prior exams although the prior exams were all noncontrast and thus the lesion may have been present but with reduced conspicuity. There is also a small enhancing lesion further posteriorly in the right hepatic lobe which is technically nonspecific and could be a small hemangioma or a small metastatic lesion. 2. Stable distribution and appearance of prior sclerotic osseous metastatic disease without bony progression. 3. 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 Surveillance suggested. 4. Other imaging findings of potential clinical significance: Aortic Atherosclerosis (ICD10-I70.0). Mild distal esophageal wall thickening, query esophagitis. Diffuse hepatic steatosis. Sigmoid diverticulosis. Ectatic celiac trunk.  Lab results today (03/01/18) of CBC and Reticulocytes is as follows: all values are WNL except for RDW at  15.1.  On review of systems, pt reports good energy levels, stable/manageable diarrhea, and denies back pains, abdominal pains, leg swelling, and any other symptoms.   MEDICAL HISTORY:  Past Medical History:  Diagnosis Date  . Cancer [facility])    Metastatic Breast Cancer    SURGICAL HISTORY: Past Surgical History:  Procedure Laterality Date  . CHOLECYSTECTOMY    . TONSILLECTOMY      SOCIAL HISTORY: Social History   Socioeconomic History  . Marital status: Single    Spouse name: Not on file  . Number of children: Not on file  . Years of education: Not on file  . Highest education level: Not on file  Occupational History  . Not on file  Social Needs  . Financial resource strain: Not on file  . Food insecurity:    Worry: Not on file    Inability: Not on file  . Transportation needs:    Medical: Not on file    Non-medical: Not on file  Tobacco Use  . Smoking status: Former Smoker    Packs/day: 0.00    Types: Cigarettes    Last attempt to quit: 01/21/2017    Years since quitting: 1.1  . Smokeless tobacco: Never Used  Substance and Sexual Activity  . Alcohol use: No    Alcohol/week: 0.0 oz  . Drug use: No  . Sexual activity: Never  Lifestyle  . Physical activity:    Days per week: Not on file    Minutes per session: Not on file  . Stress: Not on file  Relationships  . Social connections:    Talks on phone: Not on  file    Gets together: Not on file    Attends religious service: Not on file    Active member of club or organization: Not on file    Attends meetings of clubs or organizations: Not on file    Relationship status: Not on file  . Intimate partner violence:    Fear of current or ex partner: Not on file    Emotionally abused: Not on file    Physically abused: Not on file    Forced sexual activity: Not on file  Other Topics Concern  . Not on file  Social History Narrative  . 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 Cancer Mother        breast  . Cancer Sister        breast  Mother with h/o breast cancer in her 36's Sister with breast cancer in her 5ys and later was diagnosed with multiple myeloma. (Patient is not aware of any specific breast cancer mutations present)  ALLERGIES:  is allergic to thorazine [chlorpromazine] and ribociclib.  MEDICATIONS:  Current Outpatient Medications  Medication Sig Dispense Refill  . abemaciclib (VERZENIO) 150 MG tablet TAKE 1 TABLET (150 MG TOTAL) BY MOUTH 2 TIMES DAILY. 56 tablet 0  . aspirin EC 81 MG tablet Take 1 tablet (81 mg total) by mouth daily.    . ergocalciferol (VITAMIN D2) 50000 units capsule Take one capsule by mouth three times weekly. 24 capsule 3  . escitalopram (LEXAPRO) 20 MG tablet TAKE 1 TABLET(20 MG) BY MOUTH DAILY 30 tablet 0  . HYDROcodone-acetaminophen (NORCO/VICODIN) 5-325 MG tablet Take 1-2 tablets by mouth every 4 (four) hours as needed for moderate pain or severe pain. 20 tablet 0  . hydrOXYzine (ATARAX/VISTARIL) 10 MG tablet Take 2.5 tablets (25 mg total) by mouth 2 (two) times daily as needed for nausea or vomiting. 30 tablet 1  . ibuprofen (ADVIL,MOTRIN) 600 MG tablet Take 1 tablet (600 mg total) by mouth every 6 (six) hours as needed. 30 tablet 0  . letrozole (FEMARA) 2.5 MG tablet TAKE 1 TABLET(2.5 MG) BY MOUTH DAILY 30 tablet 0  . loperamide (IMODIUM) 1 MG/5ML solution Take 2 mg by mouth as needed for diarrhea or loose stools.     [name] LORazepam (ATIVAN) 0.5 MG tablet TAKE 1 TABLET BY MOUTH EVERY 6 HOURS AS NEEDED FOR ANXIETY 60 tablet 0  . ondansetron (ZOFRAN) 4 MG tablet Take 1 tablet (4 mg total) by mouth 2 (two) times daily. Take with Verzenio. 60 tablet 0  . potassium chloride SA (K-DUR,KLOR-CON) 20 MEQ tablet Take 1 tablet (20 mEq total) by mouth 2 (two) times daily. 30 tablet 0   No current facility-administered  medications for this visit.     REVIEW OF SYSTEMS:    .10 Point review of Systems was done is negative except as noted above.    PHYSICAL EXAMINATION:  ECOG PERFORMANCE STATUS: 1 - Symptomatic but completely ambulatory  . Vitals:   03/01/18 0917  BP: 130/80  Pulse: 62  Resp: 18  Temp: 98.6 F (37 C)  SpO2: 100%   Filed Weights   03/01/18 0917  Weight: 163 lb 11.2 oz (74.3 kg)   .Body mass index is 27.24 kg/m.  [name] GENERAL:alert, in no acute distress and comfortable SKIN: no acute rashes, no significant lesions EYES:  conjunctiva are pink and non-injected, sclera anicteric OROPHARYNX: MMM, no exudates, no oropharyngeal erythema or ulceration NECK: supple, no JVD LYMPH:  no palpable lymphadenopathy in the cervical, axillary or inguinal regions LUNGS: clear to auscultation b/l with normal respiratory effort HEART: regular rate & rhythm ABDOMEN:  normoactive bowel sounds , non tender, not distended. Extremity: no pedal edema PSYCH: alert & oriented x 3 with fluent speech NEURO: no focal motor/sensory deficits   LABORATORY DATA:  I have reviewed the data as listed  . CBC Latest Ref Rng & Units 03/01/2018 02/01/2018 01/04/2018  WBC 3.9 - 10.3 K/uL 3.9 4.4 4.2  Hemoglobin 11.6 - 15.9 g/dL 12.0 12.9 12.8  Hematocrit 34.8 - 46.6 % 36.9 38.7 38.7  Platelets 145 - 400 K/uL 199 191 194  HGB 12 ANC 2.1k . CBC    Component Value Date/Time   WBC 3.9 03/01/2018 0902   WBC 4.2 01/04/2018 0859   RBC 3.77 03/01/2018 0902   RBC 3.77 03/01/2018 0902   HGB 12.0 03/01/2018 0902   HGB 13.9 11/24/2017 1340   HCT 36.9 03/01/2018 0902   HCT 42.0 11/24/2017 1340   PLT 199 03/01/2018 0902   PLT 212 11/24/2017 1340   MCV 97.9 03/01/2018 0902   MCV 97.0 11/24/2017 1340   MCH 31.8 03/01/2018 0902   MCHC 32.5 03/01/2018 0902   RDW 15.1 (H) 03/01/2018 0902   RDW 14.1 11/24/2017 1340   LYMPHSABS 1.3 03/01/2018 0902   LYMPHSABS 1.4 11/24/2017 1340   MONOABS 0.4 03/01/2018 0902    MONOABS 0.3 11/24/2017 1340   EOSABS 0.1 03/01/2018 0902   EOSABS 0.1 11/24/2017 1340   BASOSABS 0.1 03/01/2018 0902   BASOSABS 0.1 11/24/2017 1340    . CMP Latest Ref Rng & Units 03/01/2018 02/01/2018 01/04/2018  Glucose 70 - 140 mg/dL 93 120 124  BUN 7 - 26 mg/dL 12 11 10   Creatinine 0.60 - 1.10 mg/dL 1.33(H) 1.18(H) 1.11(H)  Sodium 136 - 145 mmol/L 141 140 141  Potassium 3.5 - 5.1 mmol/L 3.8 3.1(L) 3.4(L)  Chloride 98 - 109 mmol/L 107 105 107  CO2 22 - 29 mmol/L 25 27 22   Calcium 8.4 - 10.4 mg/dL 9.7 9.2 8.9  Total Protein 6.4 - 8.3 g/dL 6.8 6.6 6.6  Total Bilirubin 0.2 - 1.2 mg/dL 0.3 0.4 0.5  Alkaline Phos 40 - 150 U/L 69 64 64  AST 5 - 34 U/L 24 25 21   ALT 0 - 55 U/L 26 32 23                RADIOGRAPHIC STUDIES: I have personally reviewed the radiological images as listed and agreed with the findings in the report. Ct Chest W Contrast  Result Date: 02/23/2018 CLINICAL DATA:  Metastatic left breast cancer restaging. Prior radiation therapy. EXAM: CT CHEST, ABDOMEN, AND PELVIS WITH CONTRAST TECHNIQUE: Multidetector CT imaging of the chest, abdomen and pelvis was performed following the standard protocol during bolus administration of intravenous contrast. CONTRAST:  131m ISOVUE-300 IOPAMIDOL (ISOVUE-300) INJECTION 61% COMPARISON:  Multiple exams, including 09/12/2017 FINDINGS: CT CHEST FINDINGS Cardiovascular: Mild atherosclerotic calcification of the aortic arch. Mediastinum/Nodes: No pathologic adenopathy. Mild distal esophageal wall thickening similar to prior. Lungs/Pleura: Stable 3 by 4 mm nodule anteriorly in the left lower lobe on image 61/4 with subpleural component and potential faint calcification, no change from 09/12/2017. Continued minimal focal pleural thickening at the site of the prior 9 mm nodule medially in the right middle lobe, stable at about 3 mm. Musculoskeletal: Overall  stable pattern of sclerotic osseous metastatic disease with involvement of the  ribs, thoracic vertebra, and right medial clavicle. Mild persisting cutaneous thickening in the left breast. CT ABDOMEN PELVIS FINDINGS Hepatobiliary: Diffuse hepatic steatosis. Newly apparent rim enhancing lesion in segment 5 of the liver on image 56/2 measures 2.1 by 2.0 by 2.0 cm. There is a diffusely enhancing 0.7 by 0.5 cm lesion in segment 6 on image 99/5. Cholecystectomy. Pancreas: Unremarkable Spleen: As on the prior exam just below the spleen there is what appears to likely be an accessory spleen but containing a 1.9 by 1.5 cm cystic lesion on image 55/2. This cystic component was previously the same size. Adrenals/Urinary Tract: Unremarkable Stomach/Bowel: Sigmoid colon diverticulosis. Vascular/Lymphatic: Ectatic celiac trunk at 1.2 cm diameter, image 52/2, with some mild atherosclerosis along its margin. Aortoiliac atherosclerotic vascular disease. No pathologic adenopathy observed. Reproductive: Unremarkable Other: No supplemental non-categorized findings. Musculoskeletal: Stable pattern of sclerotic osseous metastatic disease involving the right proximal femur, left posterior acetabulum, bilateral iliac bones, and lumbar spine. No new bony lesions. IMPRESSION: 1. Newly apparent 2.1 by 2.0 cm rim enhancing lesion in the right hepatic lobe suspicious for a metastatic lesion. This was not apparent on prior exams although the prior exams were all noncontrast and thus the lesion may have been present but with reduced conspicuity. There is also a small enhancing lesion further posteriorly in the right hepatic lobe which is technically nonspecific and could be a small hemangioma or a small metastatic lesion. 2. Stable distribution and appearance of prior sclerotic osseous metastatic disease without bony progression. 3. 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 Surveillance suggested. 4. Other imaging findings of potential clinical  significance: Aortic Atherosclerosis (ICD10-I70.0). Mild distal esophageal wall thickening, query esophagitis. Diffuse hepatic steatosis. Sigmoid diverticulosis. Ectatic celiac trunk. Electronically Signed   By: Van Clines M.D.   On: 02/23/2018 09:55   Ct Abdomen Pelvis W Contrast  Result Date: 02/23/2018 CLINICAL DATA:  Metastatic left breast cancer restaging. Prior radiation therapy. EXAM: CT CHEST, ABDOMEN, AND PELVIS WITH CONTRAST TECHNIQUE: Multidetector CT imaging of the chest, abdomen and pelvis was performed following the standard protocol during bolus administration of intravenous contrast. CONTRAST:  137m ISOVUE-300 IOPAMIDOL (ISOVUE-300) INJECTION 61% COMPARISON:  Multiple exams, including 09/12/2017 FINDINGS: CT CHEST FINDINGS Cardiovascular: Mild atherosclerotic calcification of the aortic arch. Mediastinum/Nodes: No pathologic adenopathy. Mild distal esophageal wall thickening similar to prior. Lungs/Pleura: Stable 3 by 4 mm nodule anteriorly in the left lower lobe on image 61/4 with subpleural component and potential faint calcification, no change from 09/12/2017. Continued minimal focal pleural thickening at the site of the prior 9 mm nodule medially in the right middle lobe, stable at about 3 mm. Musculoskeletal: Overall stable pattern of sclerotic osseous metastatic disease with involvement of the ribs, thoracic vertebra, and right medial clavicle. Mild persisting cutaneous thickening in the left breast. CT ABDOMEN PELVIS FINDINGS Hepatobiliary: Diffuse hepatic steatosis. Newly apparent rim enhancing lesion in segment 5 of the liver on image 56/2 measures 2.1 by 2.0 by 2.0 cm. There is a diffusely enhancing 0.7 by 0.5 cm lesion in segment 6 on image 99/5. Cholecystectomy. Pancreas: Unremarkable Spleen: As on the prior exam just below the spleen there is what appears to likely be an accessory spleen but containing a 1.9 by 1.5 cm cystic lesion on image 55/2. This cystic component was  previously the same size. Adrenals/Urinary Tract: Unremarkable Stomach/Bowel: Sigmoid colon  diverticulosis. Vascular/Lymphatic: Ectatic celiac trunk at 1.2 cm diameter, image 52/2, with some mild atherosclerosis along its margin. Aortoiliac atherosclerotic vascular disease. No pathologic adenopathy observed. Reproductive: Unremarkable Other: No supplemental non-categorized findings. Musculoskeletal: Stable pattern of sclerotic osseous metastatic disease involving the right proximal femur, left posterior acetabulum, bilateral iliac bones, and lumbar spine. No new bony lesions. IMPRESSION: 1. Newly apparent 2.1 by 2.0 cm rim enhancing lesion in the right hepatic lobe suspicious for a metastatic lesion. This was not apparent on prior exams although the prior exams were all noncontrast and thus the lesion may have been present but with reduced conspicuity. There is also a small enhancing lesion further posteriorly in the right hepatic lobe which is technically nonspecific and could be a small hemangioma or a small metastatic lesion. 2. Stable distribution and appearance of prior sclerotic osseous metastatic disease without bony progression. 3. 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 Surveillance suggested. 4. Other imaging findings of potential clinical significance: Aortic Atherosclerosis (ICD10-I70.0). Mild distal esophageal wall thickening, query esophagitis. Diffuse hepatic steatosis. Sigmoid diverticulosis. Ectatic celiac trunk. Electronically Signed   By: Van Clines M.D.   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 Multifocal tumor in the left breast with biopsy-proven left axillary metastases.   Noted to have extensive bone metastases and pulmonary metastases. Patient was noted to have  calvarial metastases but no overt parenchymal metastasis She has been noted to have good clinical response and her tumor marker levels of progressively normalized. 09/14/17 CT Chest/ad/pelvis Results discussed in details - good response.  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 Much improved back pain.  # 3 Neutropenia Related to her Ribociclib - resolved. Patient is currently on Verzenio and has not developed any neutropenia. This is being monitored.  #4 Increased ALT about 2X ULN - likely from Ribociclib - now resolved. Monitoring with Verzenio.  #5 Cancer related pain - controlled - status post palliative RT to spine - no currently needing any pain medications.  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 No other new medication. Now resolved. Monitoring on increasing doses of Verzenio. No issues with recurrent rash on Verzenio 161m po BID  #7 Grade 1 diarrhea related to her Verzenio. She notes it is controlled with Imodium when necessary. We discussed that we would need to hold the medication noted in the dose of the diarrhea worsens. Patient is hesitant to reduce the dose of this point and notes that she will monitor it.   Plan  Discussed pt labwork today 03/01/18; blood counts are normal and stable with the exception of her slightly elevated but stable RDW at 15.1 -Discussed the most recent 02/22/18 CT with the pt and her family which revealed  Newly apparent 2.1 by 2.0 cm rim enhancing lesion in the right hepatic lobe suspicious for a metastatic lesion. This was not apparent on prior exams although the prior exams were all noncontrast and thus the lesion may have been present but with reduced conspicuity. There is also a small enhancing lesion further posteriorly in the right hepatic lobe which is technically nonspecific and could be a small hemangioma or a small metastatic lesion. Stable distribution and appearance of prior sclerotic osseous metastatic  disease without bony progression. Stable appearance  of what appears to be an accessory spleen with a cystic lesion below the main portion of the spleen.  -Tumor markers have not changed much overall.  -Her disease is overall stable but the CT findings encourage [place] to keep a closer watch on these developments.  -Recommend repeat CT scans in 2-3 months  -Xgeva today as scheduled  -continue on Verzenio 191m po BID, no prohibitive toxicities at this time.  -Grade 1 diarrhea related to her Verzenio. She notes it is controlled with Imodium when necessary and she does not feel dose reduction to help symptoms is necessary at this time. -she will continue daily letrozole 2.5 mg. -Continue on Xgeva every 4 weeks. -continue active lifestyle with good diet and exercise. -she reports she will f/u with her psychological counselor to help with talking through some of her concerns.  -She is tolerating her increased dose of 23mLexapro well and notes she is feeling better -Pt has had both of her Pneumonia vaccines.    #6 Vitamin D deficiency Vit D levels 23.1 on 3/13 Plan -Continue ergocalciferol at current dose. 50k units 3 times a week. Recommended she take this with fatty foods to improve absorption.   -continue Xgeva q4weeks -Labs q4weeks -RTC with Dr KaIrene Limbon 4 weeks    All of the patients questions were answered with apparent satisfaction. The patient knows to call the clinic with any problems, questions or concerns.  . The total time spent in the appointment was 25 minutes and more than 50% was on counseling, reviewing CT images ans results and direct patient cares.       GaSullivan LoneD MSEpworthAHIVMS SCMadison Surgery Center LLCTSea Pines Rehabilitation Hospitalematology/Oncology Physician CoFour Winds Hospital [place](Office):       [phone] cell):  [phone]):           [phone] document serves as a record of services personally performed by GaSullivan LoneMD. It was created on his behalf by ScBaldwin [place]a  trained medical scribe. The creation of this record is based on the scribe's personal observations and the provider's statements to them.   .I have reviewed the above documentation for accuracy and completeness, and I agree with the above. .GBrunetta GeneraD MS

## 2018-03-01 ENCOUNTER — Inpatient Hospital Stay: Payer: BC Managed Care – PPO | Attending: Hematology | Admitting: Hematology

## 2018-03-01 ENCOUNTER — Inpatient Hospital Stay: Payer: BC Managed Care – PPO

## 2018-03-01 VITALS — BP 130/80 | HR 62 | Temp 98.6°F | Resp 18 | Ht 65.0 in | Wt 163.7 lb

## 2018-03-01 DIAGNOSIS — C50912 Malignant neoplasm of unspecified site of left female breast: Secondary | ICD-10-CM | POA: Diagnosis not present

## 2018-03-01 DIAGNOSIS — C7800 Secondary malignant neoplasm of unspecified lung: Secondary | ICD-10-CM | POA: Diagnosis not present

## 2018-03-01 DIAGNOSIS — C7951 Secondary malignant neoplasm of bone: Secondary | ICD-10-CM

## 2018-03-01 DIAGNOSIS — Z170 Estrogen receptor positive status [ER+]: Secondary | ICD-10-CM | POA: Diagnosis not present

## 2018-03-01 DIAGNOSIS — C787 Secondary malignant neoplasm of liver and intrahepatic bile duct: Secondary | ICD-10-CM

## 2018-03-01 DIAGNOSIS — C50919 Malignant neoplasm of unspecified site of unspecified female breast: Secondary | ICD-10-CM

## 2018-03-01 DIAGNOSIS — G893 Neoplasm related pain (acute) (chronic): Secondary | ICD-10-CM | POA: Diagnosis not present

## 2018-03-01 DIAGNOSIS — Z7189 Other specified counseling: Secondary | ICD-10-CM

## 2018-03-01 LAB — CBC WITH DIFFERENTIAL (CANCER CENTER ONLY)
BASOS ABS: 0.1 10*3/uL (ref 0.0–0.1)
Basophils Relative: 2 %
Eosinophils Absolute: 0.1 10*3/uL (ref 0.0–0.5)
Eosinophils Relative: 2 %
HEMATOCRIT: 36.9 % (ref 34.8–46.6)
Hemoglobin: 12 g/dL (ref 11.6–15.9)
LYMPHS PCT: 33 %
Lymphs Abs: 1.3 10*3/uL (ref 0.9–3.3)
MCH: 31.8 pg (ref 25.1–34.0)
MCHC: 32.5 g/dL (ref 31.5–36.0)
MCV: 97.9 fL (ref 79.5–101.0)
Monocytes Absolute: 0.4 10*3/uL (ref 0.1–0.9)
Monocytes Relative: 9 %
NEUTROS ABS: 2.1 10*3/uL (ref 1.5–6.5)
Neutrophils Relative %: 54 %
Platelet Count: 199 10*3/uL (ref 145–400)
RBC: 3.77 MIL/uL (ref 3.70–5.45)
RDW: 15.1 % — ABNORMAL HIGH (ref 11.2–14.5)
WBC: 3.9 10*3/uL (ref 3.9–10.3)

## 2018-03-01 LAB — CMP (CANCER CENTER ONLY)
ALT: 26 U/L (ref 0–55)
AST: 24 U/L (ref 5–34)
Albumin: 3.6 g/dL (ref 3.5–5.0)
Alkaline Phosphatase: 69 U/L (ref 40–150)
Anion gap: 9 (ref 3–11)
BUN: 12 mg/dL (ref 7–26)
CHLORIDE: 107 mmol/L (ref 98–109)
CO2: 25 mmol/L (ref 22–29)
CREATININE: 1.33 mg/dL — AB (ref 0.60–1.10)
Calcium: 9.7 mg/dL (ref 8.4–10.4)
GFR, EST AFRICAN AMERICAN: 49 mL/min — AB (ref >60)
GFR, Estimated: 42 mL/min — ABNORMAL LOW (ref >60)
Glucose, Bld: 93 mg/dL (ref 70–140)
Potassium: 3.8 mmol/L (ref 3.5–5.1)
SODIUM: 141 mmol/L (ref 136–145)
Total Bilirubin: 0.3 mg/dL (ref 0.2–1.2)
Total Protein: 6.8 g/dL (ref 6.4–8.3)

## 2018-03-01 LAB — RETICULOCYTES
RBC.: 3.77 MIL/uL (ref 3.70–5.45)
RETIC COUNT ABSOLUTE: 71.6 10*3/uL (ref 33.7–90.7)
Retic Ct Pct: 1.9 % (ref 0.7–2.1)

## 2018-03-01 MED ORDER — DENOSUMAB 120 MG/1.7ML ~~LOC~~ SOLN
SUBCUTANEOUS | Status: AC
  Filled 2018-03-01: qty 1.7

## 2018-03-01 MED ORDER — DENOSUMAB 120 MG/1.7ML ~~LOC~~ SOLN
120.0000 mg | Freq: Once | SUBCUTANEOUS | Status: AC
  Administered 2018-03-01: 120 mg via SUBCUTANEOUS

## 2018-03-01 NOTE — Patient Instructions (Signed)

## 2018-03-02 ENCOUNTER — Telehealth: Payer: Self-pay

## 2018-03-02 LAB — CANCER ANTIGEN 27.29: CAN 27.29: 19.3 U/mL (ref 0.0–38.6)

## 2018-03-02 LAB — CANCER ANTIGEN 15-3: CA 15-3: 17.2 U/mL (ref 0.0–25.0)

## 2018-03-02 NOTE — Telephone Encounter (Signed)
Spoke with patient concerning upcoming appointment. Will mail calender and letter per 4/10 los

## 2018-03-04 ENCOUNTER — Other Ambulatory Visit: Payer: Self-pay | Admitting: Hematology

## 2018-03-04 DIAGNOSIS — C50919 Malignant neoplasm of unspecified site of unspecified female breast: Secondary | ICD-10-CM

## 2018-03-05 ENCOUNTER — Other Ambulatory Visit: Payer: Self-pay | Admitting: Hematology

## 2018-03-08 ENCOUNTER — Ambulatory Visit: Payer: BC Managed Care – PPO

## 2018-03-09 ENCOUNTER — Other Ambulatory Visit: Payer: Self-pay | Admitting: Hematology

## 2018-03-10 ENCOUNTER — Other Ambulatory Visit: Payer: Self-pay | Admitting: Pharmacist

## 2018-03-13 ENCOUNTER — Other Ambulatory Visit: Payer: Self-pay | Admitting: Hematology and Oncology

## 2018-03-13 MED ORDER — LORAZEPAM 0.5 MG PO TABS: 0.5000 mg | ORAL_TABLET | Freq: Four times a day (QID) | ORAL | 0 refills | Status: DC | PRN

## 2018-03-17 ENCOUNTER — Other Ambulatory Visit: Payer: Self-pay | Admitting: Hematology

## 2018-03-19 ENCOUNTER — Other Ambulatory Visit: Payer: Self-pay | Admitting: Hematology

## 2018-03-19 DIAGNOSIS — C50919 Malignant neoplasm of unspecified site of unspecified female breast: Secondary | ICD-10-CM

## 2018-03-21 ENCOUNTER — Other Ambulatory Visit: Payer: Self-pay

## 2018-03-21 ENCOUNTER — Other Ambulatory Visit: Payer: Self-pay | Admitting: Pharmacist

## 2018-03-21 DIAGNOSIS — C50919 Malignant neoplasm of unspecified site of unspecified female breast: Secondary | ICD-10-CM

## 2018-03-21 MED ORDER — LETROZOLE 2.5 MG PO TABS: ORAL_TABLET | ORAL | 10 refills | Status: DC

## 2018-03-21 MED ORDER — ABEMACICLIB 150 MG PO TABS: ORAL_TABLET | ORAL | 3 refills | Status: DC

## 2018-03-27 ENCOUNTER — Other Ambulatory Visit: Payer: Self-pay | Admitting: Hematology

## 2018-03-27 NOTE — Progress Notes (Signed)
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  INTERVAL HISTORY   Stacey Cox is here for follow-up of her metastatic hormone positive HER-2 negative breast cancer. The patient's last visit with [place] was on 03/01/18. She is accompanied today by a friend. The pt reports that she is doing well overall and has recently enjoyed working in her yard, and planning a trip to [place] with her sister.    The pt reports that she is doing well on 11m Lexapro The only complaint she notes currently is more frequent heartburns which she attributes to her diet and eating habits. She has been using alka seltzer as needed. She notes no problems taking her Xgeva, and notes no dental issues and recently had her teeth cleaned.   Lab results today (03/29/18) of CBC, and Reticulocytes is as follows: all values are WNL except for WBC at 3.8k, RDW at 15.2, Retic Ct pct at 2.3%. CMP 03/29/18 is pending.  CA 15-3 from 03/29/18 is pending. CA 27.29 from 03/29/18 is pending.   On review of systems, pt reports recent heartburns, occasional hot flashes, and denies skin rashes, irregular bowel movements, new bone pains, leg swelling, abdominal pains, and any other symptoms.     MEDICAL HISTORY:  Past Medical History:  Diagnosis Date  . Cancer ([facility]    Metastatic Breast Cancer    SURGICAL HISTORY: Past Surgical History:  Procedure Laterality Date  . CHOLECYSTECTOMY    . TONSILLECTOMY      SOCIAL HISTORY: Social History   Socioeconomic History  . Marital status: Single    Spouse name: Not on file  . Number of children: Not on file  . Years of education: Not on file  . Highest education level: Not on file  Occupational History  . Not on file   Social Needs  . Financial resource strain: Not on file  . Food insecurity:    Worry: Not on file    Inability: Not on file  . Transportation needs:    Medical: Not on file    Non-medical: Not on file  Tobacco Use  . Smoking status: Former Smoker    Packs/day: 0.00    Types: Cigarettes    Last attempt to quit: 01/21/2017    Years since quitting: 1.1  . Smokeless tobacco: Never Used  Substance and Sexual Activity  . Alcohol use: No    Alcohol/week: 0.0 oz  . Drug use: No  . Sexual activity: Never  Lifestyle  . Physical activity:    Days per week: Not on file    Minutes per session: Not on file  . Stress: Not on file  Relationships  . Social connections:    Talks on phone: Not on file    Gets together: Not on file    Attends religious service: Not on file    Active member of club or organization: Not on file    Attends meetings of clubs or organizations: Not on file    Relationship status: Not on file  . Intimate partner violence:    Fear of current or ex partner: Not on file    Emotionally abused: Not on file    Physically abused: Not on file  Forced sexual activity: Not on file  Other Topics Concern  . Not on file  Social History Narrative  . 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 Cancer Mother        breast  . Cancer Sister        breast  Mother with h/o breast cancer in her 57's Sister with breast cancer in her 33ys and later was diagnosed with multiple myeloma. (Patient is not aware of any specific breast cancer mutations present)  ALLERGIES:  is allergic to thorazine [chlorpromazine] and ribociclib.  MEDICATIONS:  Current Outpatient Medications  Medication Sig Dispense Refill  . abemaciclib (VERZENIO) 150 MG tablet TAKE 1 TABLET (150 MG TOTAL) BY MOUTH 2 TIMES DAILY. 56 tablet 3  . aspirin EC 81 MG  tablet Take 1 tablet (81 mg total) by mouth daily.    . ergocalciferol (VITAMIN D2) 50000 units capsule Take one capsule by mouth three times weekly. 24 capsule 3  . escitalopram (LEXAPRO) 20 MG tablet TAKE 1 TABLET(20 MG) BY MOUTH DAILY 30 tablet 0  . HYDROcodone-acetaminophen (NORCO/VICODIN) 5-325 MG tablet Take 1-2 tablets by mouth every 4 (four) hours as needed for moderate pain or severe pain. 20 tablet 0  . hydrOXYzine (ATARAX/VISTARIL) 10 MG tablet Take 2.5 tablets (25 mg total) by mouth 2 (two) times daily as needed for nausea or vomiting. 30 tablet 1  . ibuprofen (ADVIL,MOTRIN) 600 MG tablet Take 1 tablet (600 mg total) by mouth every 6 (six) hours as needed. 30 tablet 0  . letrozole (FEMARA) 2.5 MG tablet Take 1 tablet (2.33m) by mouth daily. 30 tablet 10  . loperamide (IMODIUM) 1 MG/5ML solution Take 2 mg by mouth as needed for diarrhea or loose stools.     .[name] KitchenLORazepam (ATIVAN) 0.5 MG tablet Take 1 tablet (0.5 mg total) by mouth every 6 (six) hours as needed. for anxiety 60 tablet 0  . ondansetron (ZOFRAN) 4 MG tablet Take 1 tablet (4 mg total) by mouth 2 (two) times daily. Take with Verzenio. 60 tablet 0  . ondansetron (ZOFRAN) 4 MG tablet TAKE 1 TABLET BY MOUTH TWICE DAILY. TAKE IT WITH VERZENIO 60 tablet 0  . potassium chloride SA (K-DUR,KLOR-CON) 20 MEQ tablet Take 1 tablet (20 mEq total) by mouth 2 (two) times daily. 30 tablet 0   No current facility-administered medications for this visit.     REVIEW OF SYSTEMS:    A 10+ POINT REVIEW OF SYSTEMS WAS OBTAINED including neurology, dermatology, psychiatry, cardiac, respiratory, lymph, extremities, GI, GU, Musculoskeletal, constitutional, breasts, reproductive, HEENT.  All pertinent positives are noted in the HPI.  All others are negative.   PHYSICAL EXAMINATION:  ECOG PERFORMANCE STATUS: 1 - Symptomatic but completely ambulatory  . Vitals:   03/29/18 0901  BP: 122/84  Pulse: 60  Resp: 17  Temp: 97.8 F (36.6 C)  SpO2: 100%    Filed Weights   03/29/18 0901  Weight: 168 lb 8 oz (76.4 kg)   .Body mass index is 28.04 kg/m.  GENERAL:alert, in no acute distress and comfortable SKIN: no acute rashes, no significant lesions EYES: conjunctiva are pink and non-injected, sclera anicteric OROPHARYNX: MMM, no exudates, no oropharyngeal erythema or ulceration NECK: supple, no JVD LYMPH:  no palpable lymphadenopathy in the cervical, axillary or inguinal regions LUNGS: clear to auscultation b/l with normal  respiratory effort HEART: regular rate & rhythm ABDOMEN:  normoactive bowel sounds , non tender, not distended. Extremity: no pedal edema PSYCH: alert & oriented x 3 with fluent speech NEURO: no focal motor/sensory deficits    LABORATORY DATA:  I have reviewed the data as listed  . CBC Latest Ref Rng & Units 03/29/2018 03/01/2018 02/01/2018  WBC 3.9 - 10.3 K/uL 3.8(L) 3.9 4.4  Hemoglobin 11.6 - 15.9 g/dL 12.4 12.0 12.9  Hematocrit 34.8 - 46.6 % 37.8 36.9 38.7  Platelets 145 - 400 K/uL 202 199 191  ANC 1.9k . CBC    Component Value Date/Time   WBC 3.8 (L) 03/29/2018 0832   RBC 3.79 03/29/2018 0832   RBC 3.79 03/29/2018 0832   HGB 12.4 03/29/2018 0832   HGB 12.0 03/01/2018 0902   HGB 13.9 11/24/2017 1340   HCT 37.8 03/29/2018 0832   HCT 42.0 11/24/2017 1340   PLT 202 03/29/2018 0832   PLT 199 03/01/2018 0902   PLT 212 11/24/2017 1340   MCV 99.7 03/29/2018 0832   MCV 97.0 11/24/2017 1340   MCH 32.7 03/29/2018 0832   MCHC 32.8 03/29/2018 0832   RDW 15.2 (H) 03/29/2018 0832   RDW 14.1 11/24/2017 1340   LYMPHSABS 1.4 03/29/2018 0832   LYMPHSABS 1.4 11/24/2017 1340   MONOABS 0.4 03/29/2018 0832   MONOABS 0.3 11/24/2017 1340   EOSABS 0.1 03/29/2018 0832   EOSABS 0.1 11/24/2017 1340   BASOSABS 0.1 03/29/2018 0832   BASOSABS 0.1 11/24/2017 1340    . CMP Latest Ref Rng & Units 03/29/2018 03/01/2018 02/01/2018  Glucose 70 - 140 mg/dL 103 93 120  BUN 7 - 26 mg/dL _0 Creatinine 0.60 - 1.10 mg/dL  1.17(H) 1.33(H) 1.18(H)  Sodium 136 - 145 mmol/L 141 141 140  Potassium 3.5 - 5.1 mmol/L 3.7 3.8 3.1(L)  Chloride 98 - 109 mmol/L 110(H) 107 105  CO2 22 - 29 mmol/L _1 Calcium 8.4 - 10.4 mg/dL 9.1 9.7 9.2  Total Protein 6.4 - 8.3 g/dL 6.7 6.8 6.6  Total Bilirubin 0.2 - 1.2 mg/dL 0.3 0.3 0.4  Alkaline Phos 40 - 150 U/L 79 69 64  AST 5 - 34 U/L 32 24 25  ALT 0 - 55 U/L 34 26 32                RADIOGRAPHIC STUDIES: I have personally reviewed the radiological images as listed and agreed with the findings in the report. No results found.  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 Multifocal tumor in the left breast with biopsy-proven left axillary metastases.   Noted to have extensive bone metastases and pulmonary metastases. Patient was noted to have calvarial metastases but no overt parenchymal metastasis She has been noted to have good clinical response and her tumor marker levels of progressively normalized. 09/14/17 CT Chest/ad/pelvis Results discussed in details - good response.   02/22/18 CT with the pt and her family which revealed  Newly apparent 2.1 by 2.0 cm rim enhancing lesion in the right hepatic lobe suspicious for a metastatic lesion. This was not apparent on prior exams although the prior exams were all noncontrast and thus the lesion may have been present but with reduced conspicuity. There is also a small enhancing lesion further posteriorly in the right hepatic lobe which is technically nonspecific and could be a small hemangioma  or a small metastatic lesion. Stable distribution and appearance of prior sclerotic osseous metastatic disease without bony progression. 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 Much improved  back pain.  # 3 Neutropenia Related to her Ribociclib - resolved. Patient is currently on Verzenio and has not developed any neutropenia. This is being monitored.  #4 Increased ALT about 2X ULN - likely from Ribociclib - now resolved. Monitoring with Verzenio.  #5 Cancer related pain - controlled - status post palliative RT to spine - no currently needing any pain medications.  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 No other new medication. Now resolved. Monitoring on increasing doses of Verzenio. No issues with recurrent rash on Verzenio 153m po BID  #7 Grade 1 diarrhea related to her Verzenio. She notes it is controlled with Imodium when necessary. We discussed that we would need to hold the medication noted in the dose of the diarrhea worsens. Patient is hesitant to reduce the dose of this point and notes that she will monitor it.   Plan  -labs stable today. -no overt new clinical symptoms suggestive of breast cancer progression at this time. -tumor markers -will continue to be trended - within normal range at this time. -Recommend repeat CT scans in 2-3 months around 05/2018 to monitor the liver lesion and any other signs of progression. -Delton Seetoday as scheduled and will continue q4weeks -continue on Verzenio 1560mpo BID, no prohibitive toxicities at this time.  -Grade 1 diarrhea related to her Verzenio. She notes it is controlled with Imodium when necessary and she does not feel dose reduction to help symptoms is necessary at this time. -she will continue daily letrozole 2.5 mg. -continue active lifestyle with good diet and exercise. -She is tolerating her increased dose of 2074mexapro well and notes she is feeling better -Pt has had both of her Pneumonia vaccines.   #6 Vitamin D deficiency Vit D levels 23.1 on 3/13 Plan -Continue ergocalciferol at current dose. 50k units 3 times a week. Recommended she take this with fatty foods to improve  absorption.   Could cancel MD visit for 04/26/2018. (just labs and Xgeva shot that day) Continue XgeMarchelle Folksth labs RTC with Dr KalIrene Limbo 8 weeks per scheduled appointment.   All of the patients questions were answered with apparent satisfaction. The patient knows to call the clinic with any problems, questions or concerns.  . The total time spent in the appointment was 20 minutes and more than 50% was on counseling and direct patient cares.    GauSullivan Lone MS YaphankHIVMS SCHPuerto Rico Childrens HospitalHCorona Regional [facility] Center-Mainmatology/Oncology Physician ConSeashore Surgical InstituteOffice):       [phone] cell):  [phone]):           [phone] document serves as a record of services personally performed by GauSullivan LoneD. It was created on his behalf by [place] trained medical scribe. The creation of this record is based on the scribe's personal observations and the provider's statements to them.   .I have reviewed the above documentation for accuracy and completeness, and I agree with the above. .GaBrunetta Genera MS

## 2018-03-29 ENCOUNTER — Inpatient Hospital Stay: Payer: BC Managed Care – PPO | Attending: Hematology | Admitting: Hematology

## 2018-03-29 ENCOUNTER — Inpatient Hospital Stay: Payer: BC Managed Care – PPO

## 2018-03-29 ENCOUNTER — Ambulatory Visit: Payer: BC Managed Care – PPO

## 2018-03-29 ENCOUNTER — Encounter: Payer: Self-pay | Admitting: Hematology

## 2018-03-29 ENCOUNTER — Telehealth: Payer: Self-pay

## 2018-03-29 VITALS — BP 122/84 | HR 60 | Temp 97.8°F | Resp 17 | Ht 65.0 in | Wt 168.5 lb

## 2018-03-29 DIAGNOSIS — C50812 Malignant neoplasm of overlapping sites of left female breast: Secondary | ICD-10-CM | POA: Diagnosis present

## 2018-03-29 DIAGNOSIS — L26 Exfoliative dermatitis: Secondary | ICD-10-CM

## 2018-03-29 DIAGNOSIS — R197 Diarrhea, unspecified: Secondary | ICD-10-CM | POA: Diagnosis not present

## 2018-03-29 DIAGNOSIS — C7951 Secondary malignant neoplasm of bone: Secondary | ICD-10-CM

## 2018-03-29 DIAGNOSIS — E559 Vitamin D deficiency, unspecified: Secondary | ICD-10-CM | POA: Insufficient documentation

## 2018-03-29 DIAGNOSIS — C773 Secondary and unspecified malignant neoplasm of axilla and upper limb lymph nodes: Secondary | ICD-10-CM | POA: Diagnosis not present

## 2018-03-29 DIAGNOSIS — Z170 Estrogen receptor positive status [ER+]: Secondary | ICD-10-CM | POA: Insufficient documentation

## 2018-03-29 DIAGNOSIS — C7800 Secondary malignant neoplasm of unspecified lung: Secondary | ICD-10-CM | POA: Diagnosis not present

## 2018-03-29 DIAGNOSIS — G893 Neoplasm related pain (acute) (chronic): Secondary | ICD-10-CM | POA: Diagnosis not present

## 2018-03-29 DIAGNOSIS — C50919 Malignant neoplasm of unspecified site of unspecified female breast: Secondary | ICD-10-CM

## 2018-03-29 DIAGNOSIS — D701 Agranulocytosis secondary to cancer chemotherapy: Secondary | ICD-10-CM | POA: Diagnosis not present

## 2018-03-29 DIAGNOSIS — Z7189 Other specified counseling: Secondary | ICD-10-CM

## 2018-03-29 LAB — CMP (CANCER CENTER ONLY)
ALBUMIN: 3.5 g/dL (ref 3.5–5.0)
ALK PHOS: 79 U/L (ref 40–150)
ALT: 34 U/L (ref 0–55)
AST: 32 U/L (ref 5–34)
Anion gap: 7 (ref 3–11)
BUN: 11 mg/dL (ref 7–26)
CALCIUM: 9.1 mg/dL (ref 8.4–10.4)
CO2: 24 mmol/L (ref 22–29)
Chloride: 110 mmol/L — ABNORMAL HIGH (ref 98–109)
Creatinine: 1.17 mg/dL — ABNORMAL HIGH (ref 0.60–1.10)
GFR, Est AFR Am: 57 mL/min — ABNORMAL LOW (ref >60)
GFR, Estimated: 49 mL/min — ABNORMAL LOW (ref >60)
GLUCOSE: 103 mg/dL (ref 70–140)
POTASSIUM: 3.7 mmol/L (ref 3.5–5.1)
SODIUM: 141 mmol/L (ref 136–145)
Total Bilirubin: 0.3 mg/dL (ref 0.2–1.2)
Total Protein: 6.7 g/dL (ref 6.4–8.3)

## 2018-03-29 LAB — CBC WITH DIFFERENTIAL/PLATELET
Basophils Absolute: 0.1 10*3/uL (ref 0.0–0.1)
Basophils Relative: 2 %
Eosinophils Absolute: 0.1 10*3/uL (ref 0.0–0.5)
Eosinophils Relative: 3 %
HEMATOCRIT: 37.8 % (ref 34.8–46.6)
Hemoglobin: 12.4 g/dL (ref 11.6–15.9)
LYMPHS PCT: 36 %
Lymphs Abs: 1.4 10*3/uL (ref 0.9–3.3)
MCH: 32.7 pg (ref 25.1–34.0)
MCHC: 32.8 g/dL (ref 31.5–36.0)
MCV: 99.7 fL (ref 79.5–101.0)
MONO ABS: 0.4 10*3/uL (ref 0.1–0.9)
MONOS PCT: 9 %
NEUTROS ABS: 1.9 10*3/uL (ref 1.5–6.5)
Neutrophils Relative %: 50 %
Platelets: 202 10*3/uL (ref 145–400)
RBC: 3.79 MIL/uL (ref 3.70–5.45)
RDW: 15.2 % — ABNORMAL HIGH (ref 11.2–14.5)
WBC: 3.8 10*3/uL — ABNORMAL LOW (ref 3.9–10.3)

## 2018-03-29 LAB — RETICULOCYTES
RBC.: 3.79 MIL/uL (ref 3.70–5.45)
Retic Count, Absolute: 87.2 10*3/uL (ref 33.7–90.7)
Retic Ct Pct: 2.3 % — ABNORMAL HIGH (ref 0.7–2.1)

## 2018-03-29 MED ORDER — DENOSUMAB 120 MG/1.7ML ~~LOC~~ SOLN
120.0000 mg | Freq: Once | SUBCUTANEOUS | Status: AC
  Administered 2018-03-29: 120 mg via SUBCUTANEOUS

## 2018-03-29 MED ORDER — DENOSUMAB 120 MG/1.7ML ~~LOC~~ SOLN
SUBCUTANEOUS | Status: AC
  Filled 2018-03-29: qty 1.7

## 2018-03-29 NOTE — Telephone Encounter (Signed)
Spoke with patient concerning upcoming appointment per 5/8 los

## 2018-03-29 NOTE — Patient Instructions (Signed)
Thank you for choosing [facility] to provide your oncology and hematology care.  To afford each patient quality time with our providers, please arrive 30 minutes before your scheduled appointment time.  If you arrive late for your appointment, you may be asked to reschedule.  We strive to give you quality time with our providers, and arriving late affects you and other patients whose appointments are after yours.   If you are a no show for multiple scheduled visits, you may be dismissed from the clinic at the providers discretion.    Again, thank you for choosing [facility], our hope is that these requests will decrease the amount of time that you wait before being seen by our physicians.  ______________________________________________________________________  Should you have questions after your visit to the [facility], please contact our office at [phone] between the hours of 8:30 and 4:30 p.m.    Voicemails left after 4:30p.m will not be returned until the following business day.    For prescription refill requests, please have your pharmacy contact us directly.  Please also try to allow 48 hours for prescription requests.    Please contact the scheduling department for questions regarding scheduling.  For scheduling of procedures such as PET scans, CT scans, MRI, Ultrasound, etc please contact central scheduling at [phone].    Resources For Cancer Patients and Caregivers:   Oncolink.org:  A wonderful resource for patients and healthcare providers for information regarding your disease, ways to tract your treatment, what to expect, etc.     American Cancer Society:  800-227-2345  Can help patients locate various types of support and financial assistance  Cancer Care: 1-800-813-HOPE (4673) Provides financial assistance, online support groups, medication/co-pay assistance.    [place] DSS:  336-641-3447 Where to apply for food  stamps, Medicaid, and utility assistance  Medicare Rights Center: 800-333-4114 Helps people with Medicare understand their rights and benefits, navigate the Medicare system, and secure the quality healthcare they deserve  SCAT: 336-333-6589 [place] [employer]'s shared-ride transportation service for eligible riders who have a disability that prevents them from riding the fixed route bus.    For additional information on assistance programs please contact our social worker:   Grier Hock/Abigail Elmore:  [phone]            

## 2018-03-30 LAB — CANCER ANTIGEN 27.29: CA 27.29: 24.1 U/mL (ref 0.0–38.6)

## 2018-03-30 LAB — CANCER ANTIGEN 15-3: CA 15-3: 24.7 U/mL (ref 0.0–25.0)

## 2018-04-04 ENCOUNTER — Other Ambulatory Visit: Payer: Self-pay | Admitting: Hematology

## 2018-04-04 DIAGNOSIS — C50919 Malignant neoplasm of unspecified site of unspecified female breast: Secondary | ICD-10-CM

## 2018-04-11 ENCOUNTER — Telehealth: Payer: Self-pay | Admitting: Pharmacy Technician

## 2018-04-11 NOTE — Telephone Encounter (Signed)
Oral Oncology Patient Advocate Encounter  Received notification from [facility] that prior authorization for Verzenio is in need of renewal.   PA submitted via fax Completed Prior Authorization form faxed to [phone] Status is pending  Oral Oncology Clinic will continue to follow.  Stacey Cox. Melynda Keller, [place] Patient [place] [phone] 04/11/2018 3:44 PM

## 2018-04-12 NOTE — Telephone Encounter (Signed)
Oral Oncology Patient Advocate Encounter  Prior Authorization for Melynda Keller has been approved.    PA# [id number] Effective dates: 04/11/2018 through 04/12/2019  Oral Oncology Clinic will continue to follow.   Fabio Asa. Melynda Keller, [place] Patient [place] [phone] 04/12/2018 8:05 AM

## 2018-04-19 MED FILL — VERZENIO 150 MG TAB: 150 | 28 days supply | Qty: 56 | Fill #0

## 2018-04-21 ENCOUNTER — Other Ambulatory Visit: Payer: Self-pay | Admitting: Hematology

## 2018-04-21 DIAGNOSIS — C50919 Malignant neoplasm of unspecified site of unspecified female breast: Secondary | ICD-10-CM

## 2018-04-26 ENCOUNTER — Inpatient Hospital Stay: Payer: BC Managed Care – PPO

## 2018-04-26 ENCOUNTER — Other Ambulatory Visit: Payer: BC Managed Care – PPO

## 2018-04-26 ENCOUNTER — Ambulatory Visit: Payer: BC Managed Care – PPO | Admitting: Hematology

## 2018-04-26 ENCOUNTER — Inpatient Hospital Stay: Payer: BC Managed Care – PPO | Attending: Hematology

## 2018-04-26 ENCOUNTER — Other Ambulatory Visit: Payer: Self-pay | Admitting: Hematology

## 2018-04-26 VITALS — BP 129/74 | HR 55 | Temp 98.9°F | Resp 18

## 2018-04-26 DIAGNOSIS — C50812 Malignant neoplasm of overlapping sites of left female breast: Secondary | ICD-10-CM | POA: Diagnosis not present

## 2018-04-26 DIAGNOSIS — C50919 Malignant neoplasm of unspecified site of unspecified female breast: Secondary | ICD-10-CM

## 2018-04-26 DIAGNOSIS — C7951 Secondary malignant neoplasm of bone: Secondary | ICD-10-CM

## 2018-04-26 DIAGNOSIS — Z7189 Other specified counseling: Secondary | ICD-10-CM

## 2018-04-26 LAB — CBC WITH DIFFERENTIAL (CANCER CENTER ONLY)
Basophils Absolute: 0.1 10*3/uL (ref 0.0–0.1)
Basophils Relative: 2 %
Eosinophils Absolute: 0.1 10*3/uL (ref 0.0–0.5)
Eosinophils Relative: 2 %
HEMATOCRIT: 40.6 % (ref 34.8–46.6)
HEMOGLOBIN: 13.1 g/dL (ref 11.6–15.9)
LYMPHS ABS: 1.6 10*3/uL (ref 0.9–3.3)
Lymphocytes Relative: 35 %
MCH: 32.2 pg (ref 25.1–34.0)
MCHC: 32.3 g/dL (ref 31.5–36.0)
MCV: 99.8 fL (ref 79.5–101.0)
Monocytes Absolute: 0.3 10*3/uL (ref 0.1–0.9)
Monocytes Relative: 7 %
NEUTROS ABS: 2.5 10*3/uL (ref 1.5–6.5)
NEUTROS PCT: 54 %
Platelet Count: 215 10*3/uL (ref 145–400)
RBC: 4.07 MIL/uL (ref 3.70–5.45)
RDW: 14.3 % (ref 11.2–14.5)
WBC Count: 4.5 10*3/uL (ref 3.9–10.3)

## 2018-04-26 LAB — CMP (CANCER CENTER ONLY)
ALT: 26 U/L (ref 0–55)
ANION GAP: 10 (ref 3–11)
AST: 33 U/L (ref 5–34)
Albumin: 3.6 g/dL (ref 3.5–5.0)
Alkaline Phosphatase: 91 U/L (ref 40–150)
BUN: 11 mg/dL (ref 7–26)
CHLORIDE: 105 mmol/L (ref 98–109)
CO2: 25 mmol/L (ref 22–29)
Calcium: 9.2 mg/dL (ref 8.4–10.4)
Creatinine: 1.32 mg/dL — ABNORMAL HIGH (ref 0.60–1.10)
GFR, EST NON AFRICAN AMERICAN: 42 mL/min — AB (ref >60)
GFR, Est AFR Am: 49 mL/min — ABNORMAL LOW (ref >60)
Glucose, Bld: 94 mg/dL (ref 70–140)
POTASSIUM: 4.2 mmol/L (ref 3.5–5.1)
Sodium: 140 mmol/L (ref 136–145)
TOTAL PROTEIN: 7 g/dL (ref 6.4–8.3)
Total Bilirubin: 0.3 mg/dL (ref 0.2–1.2)

## 2018-04-26 LAB — RETICULOCYTES
RBC.: 4.07 MIL/uL (ref 3.70–5.45)
Retic Count, Absolute: 65.1 10*3/uL (ref 33.7–90.7)
Retic Ct Pct: 1.6 % (ref 0.7–2.1)

## 2018-04-26 MED ORDER — DENOSUMAB 120 MG/1.7ML ~~LOC~~ SOLN
SUBCUTANEOUS | Status: AC
  Filled 2018-04-26: qty 1.7

## 2018-04-26 MED ORDER — DENOSUMAB 120 MG/1.7ML ~~LOC~~ SOLN
120.0000 mg | Freq: Once | SUBCUTANEOUS | Status: AC
  Administered 2018-04-26: 120 mg via SUBCUTANEOUS

## 2018-04-27 LAB — CANCER ANTIGEN 27.29: CAN 27.29: 70 U/mL — AB (ref 0.0–38.6)

## 2018-04-27 LAB — CANCER ANTIGEN 15-3: CAN 15 3: 58.5 U/mL — AB (ref 0.0–25.0)

## 2018-04-28 ENCOUNTER — Ambulatory Visit: Payer: BC Managed Care – PPO | Admitting: Hematology

## 2018-04-28 ENCOUNTER — Other Ambulatory Visit: Payer: BC Managed Care – PPO

## 2018-05-01 ENCOUNTER — Other Ambulatory Visit: Payer: Self-pay | Admitting: Hematology

## 2018-05-01 DIAGNOSIS — C50919 Malignant neoplasm of unspecified site of unspecified female breast: Secondary | ICD-10-CM

## 2018-05-07 ENCOUNTER — Other Ambulatory Visit: Payer: Self-pay | Admitting: Hematology

## 2018-05-17 MED FILL — VERZENIO 150 MG TAB: 150 | 28 days supply | Qty: 56 | Fill #1

## 2018-05-23 NOTE — Progress Notes (Signed)
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  INTERVAL HISTORY   Stacey Cox is here for follow-up of her metastatic hormone positive HER-2 negative breast cancer. The patient's last visit with [place] was on 03/29/18. She is accompanied today by her friend. The pt reports that she is doing well overall and is looking forward to her trip to [place].   The pt reports that her appetite continues to fluctuate and she has lost 6 pounds in the last month. She notes that she has been working in her yard more and has been in the heat of the day more. She notes that she never has had a good appetite and has always had to force herself to eat.   She notes that she is having a bowel movement once a day, has not had to use imodium and her diarrhea has largely resolved. She does note that she has developed some increased nausea in the afternoons and has vomited a couple times. She notes that she has used 31m Zofran at morning and night, which controls her nausea well at morning and night. She has not used Hydroxyzine for her nausea as she forgot that she had Hydroxyzine.   She denies taking OTC pain medications at this time.   Lab results today (05/24/18) of CBC w/diff, CMP, and Reticulocytes is as follows: all values are WNL except for Glucose at 135, Creatinine at 1.36, Albumin at 3.4, AST at 48, Alk Phos at 143, GFR at 47.  On review of systems, pt reports resolving diarrhea, increased nausea, some vomiting, and denies new bone pains, dental pains, changes in breathing, abdominal pains, breast discomfort/changes, back pain, leg swelling, skin rashes, and any other symptoms.   MEDICAL HISTORY:  Past Medical History:  Diagnosis Date  .  Cancer ([facility]    Metastatic Breast Cancer    SURGICAL HISTORY: Past Surgical History:  Procedure Laterality Date  . CHOLECYSTECTOMY    . TONSILLECTOMY      SOCIAL HISTORY: Social History   Socioeconomic History  . Marital status: Single    Spouse name: Not on file  . Number of children: Not on file  . Years of education: Not on file  . Highest education level: Not on file  Occupational History  . Not on file  Social Needs  . Financial resource strain: Not on file  . Food insecurity:    Worry: Not on file    Inability: Not on file  . Transportation needs:    Medical: Not on file    Non-medical: Not on file  Tobacco Use  . Smoking status: Former Smoker    Packs/day: 0.00    Types: Cigarettes    Last attempt to quit: 01/21/2017    Years since quitting: 1.3  . Smokeless tobacco: Never Used  Substance and Sexual Activity  . Alcohol use: No    Alcohol/week: 0.0 oz  . Drug use: No  . Sexual activity: Never  Lifestyle  . Physical activity:    Days per week: Not on file    Minutes per session: Not on file  . Stress: Not on file  Relationships  . Social connections:    Talks on phone: Not on file  Gets together: Not on file    Attends religious service: Not on file    Active member of club or organization: Not on file    Attends meetings of clubs or organizations: Not on file    Relationship status: Not on file  . Intimate partner violence:    Fear of current or ex partner: Not on file    Emotionally abused: Not on file    Physically abused: Not on file    Forced sexual activity: Not on file  Other Topics Concern  . Not on file  Social History Narrative  . 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 Cancer Mother        breast  . Cancer Sister        breast  Mother with h/o breast cancer in her 68's Sister  with breast cancer in her 30ys and later was diagnosed with multiple myeloma. (Patient is not aware of any specific breast cancer mutations present)  ALLERGIES:  is allergic to thorazine [chlorpromazine] and ribociclib.  MEDICATIONS:  Current Outpatient Medications  Medication Sig Dispense Refill  . abemaciclib (VERZENIO) 150 MG tablet TAKE 1 TABLET (150 MG TOTAL) BY MOUTH 2 TIMES DAILY. 56 tablet 3  . aspirin EC 81 MG tablet Take 1 tablet (81 mg total) by mouth daily.    [name] escitalopram (LEXAPRO) 20 MG tablet TAKE 1 TABLET(20 MG) BY MOUTH DAILY 30 tablet 0  . HYDROcodone-acetaminophen (NORCO/VICODIN) 5-325 MG tablet Take 1-2 tablets by mouth every 4 (four) hours as needed for moderate pain or severe pain. 20 tablet 0  . hydrOXYzine (ATARAX/VISTARIL) 10 MG tablet Take 2.5 tablets (25 mg total) by mouth 2 (two) times daily as needed for nausea or vomiting. 30 tablet 1  . ibuprofen (ADVIL,MOTRIN) 600 MG tablet Take 1 tablet (600 mg total) by mouth every 6 (six) hours as needed. 30 tablet 0  . letrozole (FEMARA) 2.5 MG tablet Take 1 tablet (2.5m) by mouth daily. 30 tablet 10  . letrozole (FEMARA) 2.5 MG tablet TAKE 1 TABLET(2.5 MG) BY MOUTH DAILY 30 tablet 0  . loperamide (IMODIUM) 1 MG/5ML solution Take 2 mg by mouth as needed for diarrhea or loose stools.     .[name] KitchenLORazepam (ATIVAN) 0.5 MG tablet Take 1 tablet (0.5 mg total) by mouth every 6 (six) hours as needed. for anxiety 60 tablet 0  . ondansetron (ZOFRAN) 4 MG tablet Take 1 tablet (4 mg total) by mouth 2 (two) times daily. Take with Verzenio. 60 tablet 0  . ondansetron (ZOFRAN) 4 MG tablet Take 1 tablet (4 mg total) by mouth every 8 (eight) hours as needed for nausea or vomiting. 60 tablet 0  . potassium chloride SA (K-DUR,KLOR-CON) 20 MEQ tablet Take 1 tablet (20 mEq total) by mouth 2 (two) times daily. 30 tablet 0  . Vitamin D, Ergocalciferol, (DRISDOL) 50000 units CAPS capsule TAKE 1 CAPSULE BY MOUTH 3 TIMES WEEKLY 24 capsule 0   No  current facility-administered medications for this visit.    Facility-Administered Medications Ordered in Other Visits  Medication Dose Route Frequency Provider Last Rate Last Dose  . denosumab (XGEVA) injection 120 mg  120 mg Subcutaneous Once KBrunetta Genera MD        REVIEW OF SYSTEMS:    A 10+ POINT REVIEW OF SYSTEMS WAS OBTAINED including  neurology, dermatology, psychiatry, cardiac, respiratory, lymph, extremities, GI, GU, Musculoskeletal, constitutional, breasts, reproductive, HEENT.  All pertinent positives are noted in the HPI.  All others are negative.   PHYSICAL EXAMINATION:  ECOG PERFORMANCE STATUS: 1 - Symptomatic but completely ambulatory  Vitals:   05/24/18 0929  BP: 106/75  Pulse: 67  Resp: 18  Temp: 98.5 F (36.9 C)  SpO2: 100%   Filed Weights   05/24/18 0929  Weight: 162 lb 11.2 oz (73.8 kg)   .Body mass index is 27.07 kg/m.  GENERAL:alert, in no acute distress and comfortable SKIN: no acute rashes, no significant lesions EYES: conjunctiva are pink and non-injected, sclera anicteric OROPHARYNX: MMM, no exudates, no oropharyngeal erythema or ulceration NECK: supple, no JVD LYMPH:  no palpable lymphadenopathy in the cervical, axillary or inguinal regions LUNGS: clear to auscultation b/l with normal respiratory effort HEART: regular rate & rhythm ABDOMEN:  normoactive bowel sounds , non tender, not distended. No palpable hepatosplenomegaly.  Extremity: no pedal edema PSYCH: alert & oriented x 3 with fluent speech NEURO: no focal motor/sensory deficits    LABORATORY DATA:  I have reviewed the data as listed  . CBC Latest Ref Rng & Units 05/24/2018 04/26/2018 03/29/2018  WBC 3.9 - 10.3 K/uL 4.6 4.5 3.8(L)  Hemoglobin 11.6 - 15.9 g/dL 12.8 13.1 12.4  Hematocrit 34.8 - 46.6 % 39.0 40.6 37.8  Platelets 145 - 400 K/uL 163 215 202  ANC 2.7k   CBC    Component Value Date/Time   WBC 4.6 05/24/2018 0905   WBC 3.8 (L) 03/29/2018 0832   RBC 3.92  05/24/2018 0905   RBC 3.92 05/24/2018 0905   HGB 12.8 05/24/2018 0905   HGB 13.9 11/24/2017 1340   HCT 39.0 05/24/2018 0905   HCT 42.0 11/24/2017 1340   PLT 163 05/24/2018 0905   PLT 212 11/24/2017 1340   MCV 99.5 05/24/2018 0905   MCV 97.0 11/24/2017 1340   MCH 32.7 05/24/2018 0905   MCHC 32.8 05/24/2018 0905   RDW 14.2 05/24/2018 0905   RDW 14.1 11/24/2017 1340   LYMPHSABS 1.3 05/24/2018 0905   LYMPHSABS 1.4 11/24/2017 1340   MONOABS 0.4 05/24/2018 0905   MONOABS 0.3 11/24/2017 1340   EOSABS 0.2 05/24/2018 0905   EOSABS 0.1 11/24/2017 1340   BASOSABS 0.1 05/24/2018 0905   BASOSABS 0.1 11/24/2017 1340    . CMP Latest Ref Rng & Units 05/24/2018 04/26/2018 03/29/2018  Glucose 70 - 99 mg/dL 135(H) 94 103  BUN 8 - 23 mg/dL 17 11 11   Creatinine 0.44 - 1.00 mg/dL 1.36(H) 1.32(H) 1.17(H)  Sodium 135 - 145 mmol/L 138 140 141  Potassium 3.5 - 5.1 mmol/L 3.9 4.2 3.7  Chloride 98 - 111 mmol/L 104 105 110(H)  CO2 22 - 32 mmol/L 26 25 24   Calcium 8.9 - 10.3 mg/dL 9.2 9.2 9.1  Total Protein 6.5 - 8.1 g/dL 6.8 7.0 6.7  Total Bilirubin 0.3 - 1.2 mg/dL 0.3 0.3 0.3  Alkaline Phos 38 - 126 U/L 143(H) 91 79  AST 15 - 41 U/L 48(H) 33 32  ALT 0 - 44 U/L 33 26 34             RADIOGRAPHIC STUDIES: I have personally reviewed the radiological images as listed and agreed with the findings in the report. No results found.  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 Multifocal tumor in  the left breast with biopsy-proven left axillary metastases.   Noted to have extensive bone metastases and pulmonary metastases. Patient was noted to have calvarial metastases but no overt parenchymal metastasis She has been noted to have good clinical response and her tumor marker levels of progressively normalized. 09/14/17 CT Chest/ad/pelvis Results discussed in details - good response.    02/22/18 CT with the pt and her family which revealed  Newly apparent 2.1 by 2.0 cm rim enhancing lesion in the right hepatic lobe suspicious for a metastatic lesion. This was not apparent on prior exams although the prior exams were all noncontrast and thus the lesion may have been present but with reduced conspicuity. There is also a small enhancing lesion further posteriorly in the right hepatic lobe which is technically nonspecific and could be a small hemangioma or a small metastatic lesion. Stable distribution and appearance of prior sclerotic osseous metastatic disease without bony progression. 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 Much improved back pain.  # 3 Neutropenia Related to her Ribociclib - resolved. Patient is currently on Verzenio and has not developed any neutropenia. This is being monitored.  #4 Increased ALT about 2X ULN - likely from Ribociclib - now resolved. Monitoring with Verzenio.  #5 Cancer related pain - controlled - status post palliative RT to spine - no currently needing any pain medications.  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 No other new medication. Now resolved. Monitoring on increasing doses of Verzenio. No issues with recurrent rash on Verzenio 170m po BID  #7 Grade 1 diarrhea related to her Verzenio. She notes it is controlled with Imodium when necessary. We discussed that we would need to hold the medication noted in the dose of the diarrhea worsens. Patient is hesitant to reduce the dose of this point and notes that she will monitor it.   Plan: Pt has had both of her Pneumonia vaccines.  -Discussed pt labwork today, 05/24/18; blood counts are normal and Alk Phos elevated to 143 -Continue Xgeva every 4 weeks -Recommend taking Hydroxyzine for nausea as needed -Discussed that pt could use a  Scopolamine patch for nausea -Recommended that the pt increase her hydration as creatinine is at 1.36 -Will see pt back in one month and will monitor CA 15-3 and CA 27.29 which had both increased some at last visit  -If tumor markers are increased again, will repeat imaging sooner than planned -Continue to optimize nutritional status  -No overt new symptoms suggestive of overt breast cancer progression at this time -continue on Verzenio 1550mpo BID, no prohibitive toxicities at this time.  -Grade 1 diarrhea related to her Verzenio. She notes it is controlled with Imodium when necessary and she does not feel dose reduction to help symptoms is necessary at this time. -she will continue daily letrozole 2.5 mg.  #6 Vitamin D deficiency Vit D levels 23.1 on 3/13 Plan -Continue ergocalciferol at current dose. 50k units 3 times a week. Recommended she take this with fatty foods to improve absorption. -rpt levels on next visit  RTC with Dr KaIrene Limboith labs in 4 weeks Continue XgMarchelle Folks  All of the patients questions were answered with apparent satisfaction. The patient knows to call the clinic with any problems, questions or concerns.  The total time spent in the appt was 25 minutes  and more than 50% was on counseling and direct patient cares.    Sullivan Lone MD [place] AAHIVMS [facility] Hematology/Oncology Physician [facility]  (Office):       [phone] (Work cell):  [phone] (Fax):           [phone]  I, Baldwin [place], am acting as a scribe for Dr Irene Limbo.   .I have reviewed the above documentation for accuracy and completeness, and I agree with the above. [name] MD

## 2018-05-24 ENCOUNTER — Telehealth: Payer: Self-pay

## 2018-05-24 ENCOUNTER — Inpatient Hospital Stay: Payer: BC Managed Care – PPO

## 2018-05-24 ENCOUNTER — Other Ambulatory Visit: Payer: Self-pay | Admitting: Hematology

## 2018-05-24 ENCOUNTER — Inpatient Hospital Stay: Payer: BC Managed Care – PPO | Attending: Hematology | Admitting: Hematology

## 2018-05-24 VITALS — BP 106/75 | HR 67 | Temp 98.5°F | Resp 18 | Ht 65.0 in | Wt 162.7 lb

## 2018-05-24 DIAGNOSIS — C50912 Malignant neoplasm of unspecified site of left female breast: Secondary | ICD-10-CM | POA: Insufficient documentation

## 2018-05-24 DIAGNOSIS — C7951 Secondary malignant neoplasm of bone: Secondary | ICD-10-CM | POA: Diagnosis present

## 2018-05-24 DIAGNOSIS — R197 Diarrhea, unspecified: Secondary | ICD-10-CM | POA: Insufficient documentation

## 2018-05-24 DIAGNOSIS — C50919 Malignant neoplasm of unspecified site of unspecified female breast: Secondary | ICD-10-CM

## 2018-05-24 DIAGNOSIS — R112 Nausea with vomiting, unspecified: Secondary | ICD-10-CM | POA: Diagnosis not present

## 2018-05-24 DIAGNOSIS — C7800 Secondary malignant neoplasm of unspecified lung: Secondary | ICD-10-CM | POA: Insufficient documentation

## 2018-05-24 DIAGNOSIS — Z7189 Other specified counseling: Secondary | ICD-10-CM

## 2018-05-24 DIAGNOSIS — C773 Secondary and unspecified malignant neoplasm of axilla and upper limb lymph nodes: Secondary | ICD-10-CM | POA: Diagnosis not present

## 2018-05-24 DIAGNOSIS — Z170 Estrogen receptor positive status [ER+]: Secondary | ICD-10-CM | POA: Insufficient documentation

## 2018-05-24 DIAGNOSIS — E559 Vitamin D deficiency, unspecified: Secondary | ICD-10-CM | POA: Diagnosis not present

## 2018-05-24 LAB — CBC WITH DIFFERENTIAL (CANCER CENTER ONLY)
BASOS ABS: 0.1 10*3/uL (ref 0.0–0.1)
Basophils Relative: 2 %
EOS PCT: 4 %
Eosinophils Absolute: 0.2 10*3/uL (ref 0.0–0.5)
HCT: 39 % (ref 34.8–46.6)
Hemoglobin: 12.8 g/dL (ref 11.6–15.9)
LYMPHS PCT: 27 %
Lymphs Abs: 1.3 10*3/uL (ref 0.9–3.3)
MCH: 32.7 pg (ref 25.1–34.0)
MCHC: 32.8 g/dL (ref 31.5–36.0)
MCV: 99.5 fL (ref 79.5–101.0)
Monocytes Absolute: 0.4 10*3/uL (ref 0.1–0.9)
Monocytes Relative: 8 %
Neutro Abs: 2.7 10*3/uL (ref 1.5–6.5)
Neutrophils Relative %: 59 %
PLATELETS: 163 10*3/uL (ref 145–400)
RBC: 3.92 MIL/uL (ref 3.70–5.45)
RDW: 14.2 % (ref 11.2–14.5)
WBC: 4.6 10*3/uL (ref 3.9–10.3)

## 2018-05-24 LAB — RETICULOCYTES
RBC.: 3.92 MIL/uL (ref 3.70–5.45)
RETIC COUNT ABSOLUTE: 54.9 10*3/uL (ref 33.7–90.7)
Retic Ct Pct: 1.4 % (ref 0.7–2.1)

## 2018-05-24 LAB — CMP (CANCER CENTER ONLY)
ALT: 33 U/L (ref 0–44)
AST: 48 U/L — ABNORMAL HIGH (ref 15–41)
Albumin: 3.4 g/dL — ABNORMAL LOW (ref 3.5–5.0)
Alkaline Phosphatase: 143 U/L — ABNORMAL HIGH (ref 38–126)
Anion gap: 8 (ref 5–15)
BILIRUBIN TOTAL: 0.3 mg/dL (ref 0.3–1.2)
BUN: 17 mg/dL (ref 8–23)
CO2: 26 mmol/L (ref 22–32)
Calcium: 9.2 mg/dL (ref 8.9–10.3)
Chloride: 104 mmol/L (ref 98–111)
Creatinine: 1.36 mg/dL — ABNORMAL HIGH (ref 0.44–1.00)
GFR, Est AFR Am: 47 mL/min — ABNORMAL LOW (ref >60)
GFR, Estimated: 41 mL/min — ABNORMAL LOW (ref >60)
Glucose, Bld: 135 mg/dL — ABNORMAL HIGH (ref 70–99)
POTASSIUM: 3.9 mmol/L (ref 3.5–5.1)
Sodium: 138 mmol/L (ref 135–145)
TOTAL PROTEIN: 6.8 g/dL (ref 6.5–8.1)

## 2018-05-24 MED ORDER — DENOSUMAB 120 MG/1.7ML ~~LOC~~ SOLN
120.0000 mg | Freq: Once | SUBCUTANEOUS | Status: AC
  Administered 2018-05-24: 120 mg via SUBCUTANEOUS

## 2018-05-24 MED ORDER — DENOSUMAB 120 MG/1.7ML ~~LOC~~ SOLN
SUBCUTANEOUS | Status: AC
  Filled 2018-05-24: qty 1.7

## 2018-05-24 NOTE — Telephone Encounter (Signed)
Spoke with patient concerning upcoming appointment. Per 7/3 los

## 2018-05-24 NOTE — Patient Instructions (Signed)

## 2018-06-15 MED FILL — VERZENIO 150 MG TAB: 150 | 28 days supply | Qty: 56 | Fill #2

## 2018-06-17 ENCOUNTER — Other Ambulatory Visit: Payer: Self-pay | Admitting: Hematology

## 2018-06-20 NOTE — Progress Notes (Signed)
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  INTERVAL HISTORY   Stacey Cox is here for follow-up of her metastatic hormone positive HER-2 negative breast cancer. The patient's last visit with [place] was on 05/24/18. She is accompanied today by a friend. The pt reports that she is doing fine overall but did not enjoy a recent trip out of the country to [place].   The pt reports that she felt generally unwell on her recent trip to [place], with bouts of vomiting and nausea. She notes that her episodes of vomiting were sporadic, but included vomiting after eating fries and drinking coke and was confused about what was leading to her sudden vomiting. She noted feeling general constitutional relief after vomiting. She notes that her nausea has persisted since returning, denies daily occurrence however. She also notes that she has moved from diarrhea to constipation and will begin taking Senna S today, as she hasn't had a bowel movement in 5 days. She also reports some new pain in her upper right abdomen after her trip, which she notes has gotten better. She returned from [place] on 06/08/18.   The pt notes that she does not have an appetite, eating a third of her meals or less. She has lost 4 pounds since our last visit, but associates this with her recent nausea and vomiting while abroad.   Lab results today (06/21/18) of CBC w/diff, CMP is as follows: all values are WNL except for RDW at 15.5, Glucose at 130, Creatinine at 1.27, Albumin at 3.2, AST at 101, ALT at 49, Alk Phos at 233, GFR at 51. Vitamoin D 06/21/18 is pending CA15-3 from 06/21/18 is pending CA27.29 from 06/21/18 is pending  On review of systems, pt reports  recent vomiting, recent nausea, recent upper right abdominal pain, some weight loss, feeling more tired, weakened appetite, and denies new breast pain or changes, pain along the spine, other abdominal pains, leg swelling or pain, and any other symptoms.   MEDICAL HISTORY:  Past Medical History:  Diagnosis Date  . Cancer [facility])    Metastatic Breast Cancer    SURGICAL HISTORY: Past Surgical History:  Procedure Laterality Date  . CHOLECYSTECTOMY    . TONSILLECTOMY      SOCIAL HISTORY: Social History   Socioeconomic History  . Marital status: Single    Spouse name: Not on file  . Number of children: Not on file  . Years of education: Not on file  . Highest education level: Not on file  Occupational History  . Not on file  Social Needs  . Financial resource strain: Not on file  . Food insecurity:    Worry: Not on file    Inability: Not on file  . Transportation needs:    Medical: Not on file    Non-medical: Not on file  Tobacco Use  . Smoking status: Former Smoker    Packs/day: 0.00    Types: Cigarettes    Last attempt to quit: 01/21/2017    Years since quitting: 1.4  . Smokeless tobacco: Never Used  Substance and Sexual Activity  . Alcohol use: No    Alcohol/week: 0.0 oz  . Drug use: No  . Sexual activity: Never  Lifestyle  . Physical activity:    Days per week: Not on file    Minutes per session: Not on file  . Stress: Not on file  Relationships  . Social connections:    Talks on phone: Not on file    Gets together: Not on file    Attends religious service: Not on file    Active member of club or organization: Not on file    Attends meetings of clubs or organizations: Not on file    Relationship status: Not on file  . Intimate partner violence:    Fear of current or ex partner: Not on file    Emotionally abused: Not on file    Physically abused: Not on file    Forced sexual activity: Not on file  Other Topics Concern  . Not on file  Social History  Narrative  . 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 Cancer Mother        breast  . Cancer Sister        breast  Mother with h/o breast cancer in her 34's Sister with breast cancer in her 24ys and later was diagnosed with multiple myeloma. (Patient is not aware of any specific breast cancer mutations present)  ALLERGIES:  is allergic to thorazine [chlorpromazine] and ribociclib.  MEDICATIONS:  Current Outpatient Medications  Medication Sig Dispense Refill  . abemaciclib (VERZENIO) 150 MG tablet TAKE 1 TABLET (150 MG TOTAL) BY MOUTH 2 TIMES DAILY. 56 tablet 3  . aspirin EC 81 MG tablet Take 1 tablet (81 mg total) by mouth daily.    [name] escitalopram (LEXAPRO) 20 MG tablet TAKE 1 TABLET(20 MG) BY MOUTH DAILY 30 tablet 0  . HYDROcodone-acetaminophen (NORCO/VICODIN) 5-325 MG tablet Take 1-2 tablets by mouth every 4 (four) hours as needed for moderate pain or severe pain. 20 tablet 0  . hydrOXYzine (ATARAX/VISTARIL) 10 MG tablet Take 2.5 tablets (25 mg total) by mouth 2 (two) times daily as needed for nausea or vomiting. 30 tablet 1  . ibuprofen (ADVIL,MOTRIN) 600 MG tablet Take 1 tablet (600 mg total) by mouth every 6 (six) hours as needed. 30 tablet 0  . letrozole (FEMARA) 2.5 MG tablet Take 1 tablet (2.66m) by mouth daily. 30 tablet 10  . letrozole (FEMARA) 2.5 MG tablet TAKE 1 TABLET(2.5 MG) BY MOUTH DAILY 30 tablet 0  . loperamide (IMODIUM) 1 MG/5ML solution Take 2 mg by mouth as needed for diarrhea or loose stools.     .[name] KitchenLORazepam (ATIVAN) 0.5 MG tablet Take 1 tablet (0.5 mg total) by mouth every 6 (six) hours as needed. for anxiety 60 tablet 0  . ondansetron (ZOFRAN) 4 MG tablet Take 1 tablet (4 mg total) by mouth 2 (two) times daily. Take with Verzenio. 60 tablet 0  . ondansetron (ZOFRAN) 4 MG tablet Take 1 tablet (4 mg total) by mouth  every 8 (eight) hours as needed for nausea or vomiting. 60 tablet 0  . potassium chloride SA (K-DUR,KLOR-CON) 20 MEQ tablet Take 1 tablet (20 mEq total) by mouth 2 (two) times daily. 30 tablet 0  . Vitamin D, Ergocalciferol, (DRISDOL) 50000 units CAPS capsule TAKE 1 CAPSULE BY MOUTH 3 TIMES WEEKLY 24 capsule 0   No current facility-administered medications for this visit.    Facility-Administered Medications Ordered in Other Visits  Medication Dose Route Frequency Provider Last Rate Last Dose  . denosumab (XGEVA) injection 120 mg  120 mg Subcutaneous Once [name], MD        REVIEW OF SYSTEMS:    A 10+ POINT REVIEW OF SYSTEMS WAS OBTAINED including neurology, dermatology, psychiatry, cardiac, respiratory, lymph, extremities, GI, GU, Musculoskeletal, constitutional, breasts, reproductive, HEENT.  All pertinent positives are noted in the HPI.  All others are negative.   PHYSICAL EXAMINATION:  ECOG PERFORMANCE STATUS: 1 - Symptomatic but completely ambulatory  Vitals:   06/21/18 0907  BP: 99/73  Pulse: 81  Resp: 17  Temp: 98.4 F (36.9 C)  SpO2: 100%   Filed Weights   06/21/18 0907  Weight: 158 lb 14.4 oz (72.1 kg)   .Body mass index is 26.44 kg/m.  GENERAL:alert, in no acute distress and comfortable SKIN: no acute rashes, no significant lesions EYES: conjunctiva are pink and non-injected, sclera anicteric OROPHARYNX: MMM, no exudates, no oropharyngeal erythema or ulceration NECK: supple, no JVD LYMPH:  no palpable lymphadenopathy in the cervical, axillary or inguinal regions LUNGS: clear to auscultation b/l with normal respiratory effort HEART: regular rate & rhythm ABDOMEN:  normoactive bowel sounds , right upper quadrant tenderness to palpation without guarding or rigidity, not distended. No palpable hepatosplenomegaly  Extremity: no pedal edema PSYCH: alert & oriented x 3 with fluent speech NEURO: no focal motor/sensory deficits   LABORATORY DATA:  I have  reviewed the data as listed  . CBC Latest Ref Rng & Units 06/21/2018 05/24/2018 04/26/2018  WBC 3.9 - 10.3 K/uL 4.3 4.6 4.5  Hemoglobin 11.6 - 15.9 g/dL 13.1 12.8 13.1  Hematocrit 34.8 - 46.6 % 39.3 39.0 40.6  Platelets 145 - 400 K/uL 160 163 215  ANC 2.7k   CBC    Component Value Date/Time   WBC 4.3 06/21/2018 0833   RBC 3.99 06/21/2018 0833   HGB 13.1 06/21/2018 0833   HGB 12.8 05/24/2018 0905   HGB 13.9 11/24/2017 1340   HCT 39.3 06/21/2018 0833   HCT 42.0 11/24/2017 1340   PLT 160 06/21/2018 0833   PLT 163 05/24/2018 0905   PLT 212 11/24/2017 1340   MCV 98.5 06/21/2018 0833   MCV 97.0 11/24/2017 1340   MCH 32.7 06/21/2018 0833   MCHC 33.3 06/21/2018 0833   RDW 15.5 (H) 06/21/2018 0833   RDW 14.1 11/24/2017 1340   LYMPHSABS 1.0 06/21/2018 0833   LYMPHSABS 1.4 11/24/2017 1340   MONOABS 0.3 06/21/2018 0833   MONOABS 0.3 11/24/2017 1340   EOSABS 0.1 06/21/2018 0833   EOSABS 0.1 11/24/2017 1340   BASOSABS 0.1 06/21/2018 0833   BASOSABS 0.1 11/24/2017 1340    . CMP Latest Ref Rng & Units 06/21/2018 05/24/2018 04/26/2018  Glucose 70 - 99 mg/dL 130(H) 135(H) 94  BUN 8 - 23 mg/dL _0 Creatinine 0.44 - 1.00 mg/dL 1.27(H) 1.36(H) 1.32(H)  Sodium 135 - 145 mmol/L 142 138 140  Potassium 3.5 - 5.1 mmol/L 3.9 3.9 4.2  Chloride 98 - 111 mmol/L 105 104 105  CO2 22 - 32 mmol/L _1 Calcium 8.9 - 10.3 mg/dL 9.6 9.2 9.2  Total Protein 6.5 - 8.1 g/dL 6.9 6.8 7.0  Total Bilirubin 0.3 - 1.2 mg/dL 0.6 0.3 0.3  Alkaline Phos 38 - 126 U/L 233(H) 143(H) 91  AST 15 - 41 U/L 101(H) 48(H) 33  ALT 0 - 44 U/L 49(H) 33 26  RADIOGRAPHIC STUDIES: I have personally reviewed the radiological images as listed and agreed with the findings in the report. No results found.  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 Multifocal tumor in the left breast with  biopsy-proven left axillary metastases.   Noted to have extensive bone metastases and pulmonary metastases. Patient was noted to have calvarial metastases but no overt parenchymal metastasis She has been noted to have good clinical response and her tumor marker levels of progressively normalized. 09/14/17 CT Chest/ad/pelvis Results discussed in details - good response.   02/22/18 CT with the pt and her family which revealed  Newly apparent 2.1 by 2.0 cm rim enhancing lesion in the right hepatic lobe suspicious for a metastatic lesion. This was not apparent on prior exams although the prior exams were all noncontrast and thus the lesion may have been present but with reduced conspicuity. There is also a small enhancing lesion further posteriorly in the right hepatic lobe which is technically nonspecific and could be a small hemangioma or a small metastatic lesion. Stable distribution and appearance of prior sclerotic osseous metastatic disease without bony progression. 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 Much improved back pain.  # 3 Neutropenia Related to her Ribociclib - resolved. Patient is currently on Verzenio and has not developed any neutropenia. This is being monitored.  #4 Increased ALT about 2X ULN - likely from Ribociclib - now resolved. Monitoring with Verzenio.  #5 Cancer related pain - controlled - status post palliative RT to spine - no currently needing any pain medications.  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 No other new medication. Now resolved. Monitoring on increasing doses of Verzenio. No issues with recurrent rash on Verzenio 136m po BID  #7 Grade 1 diarrhea related to her Verzenio. She notes it is controlled with Imodium when necessary. We discussed that we would need to hold the medication noted in the dose of  the diarrhea worsens. Patient is hesitant to reduce the dose of this point and notes that she will monitor it.   Plan: -Discussed pt labwork today, 06/21/18; blood counts are normal, Liver functions increased with AST at 101, ALT at -Discussed again that the 04/26/18 CA 27.29 had increased to 70 and the CA15-3 had increased to 58.5 -Pt has had a cholecystectomy in the past -Liver functions increased, previous enhancing lesion in right hepatic lobe known from 02/22/18 CT A/P, new pain in upper right abdomen, will need to further evaluate with PET/CT in the next week given significant increase in tumor markers to determine next treatment strategy. -Recommend Miralax as needed for constipation relief and increasing hydration  -Will see pt back in 2 weeks with PET/CT results  -Continue Xgeva every 4 weeks -Recommend taking Hydroxyzine for nausea as needed -Continue to optimize nutritional status  -continue on Verzenio 1536mpo BID, no prohibitive toxicities at this time.  -she will continue daily letrozole 2.5 mg.  #6 Vitamin D deficiency Vit D levels 23.1 on 3/13. Back down to 19 Plan -Continue ergocalciferol at current dose. 50k units 3 times a week. Recommended she take this with fatty foods to improve absorption.   PET/CT in 5 days RTC with Dr KaIrene Limbon 12 days with labs  Continue Marchelle Folks    All of the patients questions were answered with apparent satisfaction. The patient knows to call the clinic with any problems, questions or concerns.  The total time spent in the appt was 30 minutes and more than 50% was on counseling and direct patient cares.    Sullivan Lone MD MS AAHIVMS [facility] Hematology/Oncology Physician [facility]  (Office):       [phone] (Work cell):  [phone] (Fax):           [phone]  I, Baldwin [place], am acting as a scribe for Dr. Irene Limbo  .I have reviewed the above documentation for accuracy and completeness, and I agree with the  above. [name] MD

## 2018-06-21 ENCOUNTER — Encounter: Payer: Self-pay | Admitting: Hematology

## 2018-06-21 ENCOUNTER — Inpatient Hospital Stay: Payer: BC Managed Care – PPO

## 2018-06-21 ENCOUNTER — Inpatient Hospital Stay (HOSPITAL_BASED_OUTPATIENT_CLINIC_OR_DEPARTMENT_OTHER): Payer: BC Managed Care – PPO | Admitting: Hematology

## 2018-06-21 VITALS — BP 99/73 | HR 81 | Temp 98.4°F | Resp 17 | Ht 65.0 in | Wt 158.9 lb

## 2018-06-21 DIAGNOSIS — C7800 Secondary malignant neoplasm of unspecified lung: Secondary | ICD-10-CM | POA: Diagnosis not present

## 2018-06-21 DIAGNOSIS — E559 Vitamin D deficiency, unspecified: Secondary | ICD-10-CM

## 2018-06-21 DIAGNOSIS — G893 Neoplasm related pain (acute) (chronic): Secondary | ICD-10-CM

## 2018-06-21 DIAGNOSIS — C7951 Secondary malignant neoplasm of bone: Secondary | ICD-10-CM

## 2018-06-21 DIAGNOSIS — C50912 Malignant neoplasm of unspecified site of left female breast: Secondary | ICD-10-CM

## 2018-06-21 DIAGNOSIS — R945 Abnormal results of liver function studies: Secondary | ICD-10-CM

## 2018-06-21 DIAGNOSIS — C50919 Malignant neoplasm of unspecified site of unspecified female breast: Secondary | ICD-10-CM

## 2018-06-21 DIAGNOSIS — R197 Diarrhea, unspecified: Secondary | ICD-10-CM

## 2018-06-21 DIAGNOSIS — C773 Secondary and unspecified malignant neoplasm of axilla and upper limb lymph nodes: Secondary | ICD-10-CM

## 2018-06-21 DIAGNOSIS — R7989 Other specified abnormal findings of blood chemistry: Secondary | ICD-10-CM

## 2018-06-21 DIAGNOSIS — Z7189 Other specified counseling: Secondary | ICD-10-CM

## 2018-06-21 LAB — CMP (CANCER CENTER ONLY)
ALBUMIN: 3.2 g/dL — AB (ref 3.5–5.0)
ALT: 49 U/L — ABNORMAL HIGH (ref 0–44)
ANION GAP: 9 (ref 5–15)
AST: 101 U/L — ABNORMAL HIGH (ref 15–41)
Alkaline Phosphatase: 233 U/L — ABNORMAL HIGH (ref 38–126)
BILIRUBIN TOTAL: 0.6 mg/dL (ref 0.3–1.2)
BUN: 8 mg/dL (ref 8–23)
CHLORIDE: 105 mmol/L (ref 98–111)
CO2: 28 mmol/L (ref 22–32)
Calcium: 9.6 mg/dL (ref 8.9–10.3)
Creatinine: 1.27 mg/dL — ABNORMAL HIGH (ref 0.44–1.00)
GFR, EST AFRICAN AMERICAN: 51 mL/min — AB (ref >60)
GFR, Estimated: 44 mL/min — ABNORMAL LOW (ref >60)
GLUCOSE: 130 mg/dL — AB (ref 70–99)
POTASSIUM: 3.9 mmol/L (ref 3.5–5.1)
Sodium: 142 mmol/L (ref 135–145)
Total Protein: 6.9 g/dL (ref 6.5–8.1)

## 2018-06-21 LAB — CBC WITH DIFFERENTIAL/PLATELET
Basophils Absolute: 0.1 10*3/uL (ref 0.0–0.1)
Basophils Relative: 2 %
Eosinophils Absolute: 0.1 10*3/uL (ref 0.0–0.5)
Eosinophils Relative: 3 %
HEMATOCRIT: 39.3 % (ref 34.8–46.6)
HEMOGLOBIN: 13.1 g/dL (ref 11.6–15.9)
LYMPHS ABS: 1 10*3/uL (ref 0.9–3.3)
LYMPHS PCT: 23 %
MCH: 32.7 pg (ref 25.1–34.0)
MCHC: 33.3 g/dL (ref 31.5–36.0)
MCV: 98.5 fL (ref 79.5–101.0)
MONO ABS: 0.3 10*3/uL (ref 0.1–0.9)
MONOS PCT: 8 %
NEUTROS ABS: 2.8 10*3/uL (ref 1.5–6.5)
NEUTROS PCT: 64 %
Platelets: 160 10*3/uL (ref 145–400)
RBC: 3.99 MIL/uL (ref 3.70–5.45)
RDW: 15.5 % — AB (ref 11.2–14.5)
WBC: 4.3 10*3/uL (ref 3.9–10.3)

## 2018-06-21 MED ORDER — DENOSUMAB 120 MG/1.7ML ~~LOC~~ SOLN
SUBCUTANEOUS | Status: AC
  Filled 2018-06-21: qty 1.7

## 2018-06-21 MED ORDER — DENOSUMAB 120 MG/1.7ML ~~LOC~~ SOLN
120.0000 mg | Freq: Once | SUBCUTANEOUS | Status: AC
  Administered 2018-06-21: 120 mg via SUBCUTANEOUS

## 2018-06-22 ENCOUNTER — Telehealth: Payer: Self-pay

## 2018-06-22 LAB — CANCER ANTIGEN 27.29: CA 27.29: 381.9 U/mL — ABNORMAL HIGH (ref 0.0–38.6)

## 2018-06-22 LAB — CANCER ANTIGEN 15-3: CA 15-3: 391 U/mL — ABNORMAL HIGH (ref 0.0–25.0)

## 2018-06-22 LAB — VITAMIN D 25 HYDROXY (VIT D DEFICIENCY, FRACTURES): VIT D 25 HYDROXY: 19 ng/mL — AB (ref 30.0–100.0)

## 2018-06-22 NOTE — Telephone Encounter (Signed)
Left a detailed message of upcoming appointment. Per 7/31 los mailed letter, with calender enclosed.

## 2018-07-04 ENCOUNTER — Inpatient Hospital Stay: Payer: BC Managed Care – PPO

## 2018-07-04 ENCOUNTER — Ambulatory Visit: Payer: BC Managed Care – PPO | Admitting: Hematology

## 2018-07-05 ENCOUNTER — Ambulatory Visit (HOSPITAL_COMMUNITY)
Admission: RE | Admit: 2018-07-05 | Discharge: 2018-07-05 | Disposition: A | Discharge status: Home / Self Care | Payer: BC Managed Care – PPO | Source: Ambulatory Visit | Attending: Hematology | Admitting: Hematology

## 2018-07-05 ENCOUNTER — Other Ambulatory Visit: Payer: Self-pay | Admitting: Hematology

## 2018-07-05 ENCOUNTER — Encounter (HOSPITAL_COMMUNITY): Payer: Self-pay

## 2018-07-05 ENCOUNTER — Ambulatory Visit (HOSPITAL_COMMUNITY): Payer: BC Managed Care – PPO

## 2018-07-05 DIAGNOSIS — R945 Abnormal results of liver function studies: Secondary | ICD-10-CM | POA: Insufficient documentation

## 2018-07-05 DIAGNOSIS — R7989 Other specified abnormal findings of blood chemistry: Secondary | ICD-10-CM

## 2018-07-05 DIAGNOSIS — C50919 Malignant neoplasm of unspecified site of unspecified female breast: Secondary | ICD-10-CM

## 2018-07-05 DIAGNOSIS — C7800 Secondary malignant neoplasm of unspecified lung: Secondary | ICD-10-CM | POA: Insufficient documentation

## 2018-07-06 ENCOUNTER — Encounter (HOSPITAL_COMMUNITY): Payer: Self-pay

## 2018-07-06 ENCOUNTER — Ambulatory Visit (HOSPITAL_COMMUNITY)
Admission: RE | Admit: 2018-07-06 | Discharge: 2018-07-06 | Disposition: A | Discharge status: Home / Self Care | Payer: BC Managed Care – PPO | Source: Ambulatory Visit | Attending: Hematology | Admitting: Hematology

## 2018-07-06 ENCOUNTER — Other Ambulatory Visit: Payer: Self-pay | Admitting: Hematology

## 2018-07-06 DIAGNOSIS — K7689 Other specified diseases of liver: Secondary | ICD-10-CM | POA: Diagnosis not present

## 2018-07-06 DIAGNOSIS — C7951 Secondary malignant neoplasm of bone: Secondary | ICD-10-CM | POA: Diagnosis not present

## 2018-07-06 DIAGNOSIS — C50919 Malignant neoplasm of unspecified site of unspecified female breast: Secondary | ICD-10-CM | POA: Insufficient documentation

## 2018-07-06 MED ORDER — IOHEXOL 300 MG/ML  SOLN
100.0000 mL | Freq: Once | INTRAMUSCULAR | Status: AC | PRN
  Administered 2018-07-06: 100 mL via INTRAVENOUS

## 2018-07-07 ENCOUNTER — Other Ambulatory Visit: Payer: Self-pay

## 2018-07-07 ENCOUNTER — Telehealth: Payer: Self-pay | Admitting: Hematology

## 2018-07-07 ENCOUNTER — Inpatient Hospital Stay (HOSPITAL_BASED_OUTPATIENT_CLINIC_OR_DEPARTMENT_OTHER): Payer: BC Managed Care – PPO | Admitting: Hematology

## 2018-07-07 ENCOUNTER — Encounter: Payer: Self-pay | Admitting: Hematology

## 2018-07-07 ENCOUNTER — Inpatient Hospital Stay: Payer: BC Managed Care – PPO | Attending: Hematology

## 2018-07-07 VITALS — BP 93/64 | HR 89 | Temp 98.7°F | Resp 18 | Ht 65.0 in | Wt 155.5 lb

## 2018-07-07 DIAGNOSIS — Z452 Encounter for adjustment and management of vascular access device: Secondary | ICD-10-CM | POA: Diagnosis not present

## 2018-07-07 DIAGNOSIS — R197 Diarrhea, unspecified: Secondary | ICD-10-CM

## 2018-07-07 DIAGNOSIS — Z170 Estrogen receptor positive status [ER+]: Secondary | ICD-10-CM

## 2018-07-07 DIAGNOSIS — Z5111 Encounter for antineoplastic chemotherapy: Secondary | ICD-10-CM | POA: Diagnosis not present

## 2018-07-07 DIAGNOSIS — C787 Secondary malignant neoplasm of liver and intrahepatic bile duct: Secondary | ICD-10-CM | POA: Diagnosis not present

## 2018-07-07 DIAGNOSIS — C50812 Malignant neoplasm of overlapping sites of left female breast: Secondary | ICD-10-CM | POA: Insufficient documentation

## 2018-07-07 DIAGNOSIS — C7951 Secondary malignant neoplasm of bone: Secondary | ICD-10-CM | POA: Insufficient documentation

## 2018-07-07 DIAGNOSIS — G893 Neoplasm related pain (acute) (chronic): Secondary | ICD-10-CM | POA: Diagnosis not present

## 2018-07-07 DIAGNOSIS — C50919 Malignant neoplasm of unspecified site of unspecified female breast: Secondary | ICD-10-CM

## 2018-07-07 DIAGNOSIS — E559 Vitamin D deficiency, unspecified: Secondary | ICD-10-CM | POA: Diagnosis not present

## 2018-07-07 DIAGNOSIS — C7800 Secondary malignant neoplasm of unspecified lung: Secondary | ICD-10-CM | POA: Insufficient documentation

## 2018-07-07 LAB — CBC WITH DIFFERENTIAL/PLATELET
BASOS ABS: 0 10*3/uL (ref 0.0–0.1)
BASOS PCT: 1 %
EOS ABS: 0.1 10*3/uL (ref 0.0–0.5)
EOS PCT: 2 %
HCT: 37.7 % (ref 34.8–46.6)
HEMOGLOBIN: 12.3 g/dL (ref 11.6–15.9)
Lymphocytes Relative: 18 %
Lymphs Abs: 0.8 10*3/uL — ABNORMAL LOW (ref 0.9–3.3)
MCH: 32.5 pg (ref 25.1–34.0)
MCHC: 32.6 g/dL (ref 31.5–36.0)
MCV: 99.7 fL (ref 79.5–101.0)
Monocytes Absolute: 0.2 10*3/uL (ref 0.1–0.9)
Monocytes Relative: 5 %
NEUTROS PCT: 74 %
Neutro Abs: 3.3 10*3/uL (ref 1.5–6.5)
PLATELETS: 107 10*3/uL — AB (ref 145–400)
RBC: 3.78 MIL/uL (ref 3.70–5.45)
RDW: 18 % — ABNORMAL HIGH (ref 11.2–14.5)
WBC: 4.5 10*3/uL (ref 3.9–10.3)

## 2018-07-07 LAB — CMP (CANCER CENTER ONLY)
ALBUMIN: 2.8 g/dL — AB (ref 3.5–5.0)
ALK PHOS: 359 U/L — AB (ref 38–126)
ALT: 89 U/L — AB (ref 0–44)
AST: 210 U/L — AB (ref 15–41)
Anion gap: 11 (ref 5–15)
BUN: 10 mg/dL (ref 8–23)
CHLORIDE: 102 mmol/L (ref 98–111)
CO2: 27 mmol/L (ref 22–32)
CREATININE: 1.41 mg/dL — AB (ref 0.44–1.00)
Calcium: 8.2 mg/dL — ABNORMAL LOW (ref 8.9–10.3)
GFR, EST AFRICAN AMERICAN: 45 mL/min — AB (ref >60)
GFR, Estimated: 39 mL/min — ABNORMAL LOW (ref >60)
GLUCOSE: 137 mg/dL — AB (ref 70–99)
Potassium: 3.1 mmol/L — ABNORMAL LOW (ref 3.5–5.1)
SODIUM: 140 mmol/L (ref 135–145)
Total Bilirubin: 2 mg/dL — ABNORMAL HIGH (ref 0.3–1.2)
Total Protein: 6.3 g/dL — ABNORMAL LOW (ref 6.5–8.1)

## 2018-07-07 LAB — RETICULOCYTES
RBC.: 3.78 MIL/uL (ref 3.70–5.45)
Retic Count, Absolute: 98.3 10*3/uL — ABNORMAL HIGH (ref 33.7–90.7)
Retic Ct Pct: 2.6 % — ABNORMAL HIGH (ref 0.7–2.1)

## 2018-07-07 MED ORDER — DEXAMETHASONE 4 MG PO TABS: 4.0000 mg | ORAL_TABLET | Freq: Every day | ORAL | 0 refills | Status: DC

## 2018-07-07 MED ORDER — SCOPOLAMINE 1 MG/3DAYS TD PT72: 1.0000 | MEDICATED_PATCH | TRANSDERMAL | 0 refills | Status: DC

## 2018-07-07 NOTE — Telephone Encounter (Signed)
Appts scheduled AVS/Calendar printed per 8/16 los °

## 2018-07-07 NOTE — Progress Notes (Signed)
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  INTERVAL HISTORY   Stacey Cox is here for follow-up of her metastatic hormone positive HER-2 negative breast cancer. The patient's last visit with [place] was on 06/21/18. She is accompanied today by her friend and two sisters. The pt reports that she is doing well overall.   The pt reports that she is having nausea about every other day and is only able to tolerate chicken noodle soup. She notes that her nausea has not been controlled by Zofran.   Of note since the patient's last visit, pt has had CT C/A/P completed on 07/06/18 with results revealing New pattern of heterogeneous enhancement and enhancing ill-defined lesions in the central LEFT and RIGHT hepatic lobe at site prior enhancing lesion is highly concerning for progression of of infiltrative malignancy in the liver occupying a large portion of the RIGHT hepatic lobe and central LEFT hepatic lobe. 2. Small amount of free fluid in the abdomen pelvis. 3. No metastatic adenopathy.4. No evidence pulmonary metastasis or lymphadenopathy. 5. Stable dense sclerotic skeletal metastasis  Lab results today (07/07/18) of CBC w/diff, CMP, and Reticulocytes is as follows: all values are WNL except for RDW at 18.0, PLT at 107k, Lymphs abs at 800, Potassium at 3.1, Glucose at 137, Creatinine at 1.41, Calcium at 8.2, Total Protein at 6.3, Albumin at 2.8, AST at 210, ALT at 89, Alk Phos at 359, Total Bilirubin at 2.0, GFR at 45.  Last CA 27.29 from 06/21/18 was increased to 381.9 Last Ca 15-3 from 06/21/18 was increased to 391.0  On review of systems, pt reports nausea, weak appetite, vomiting, more weakness, some abdominal discomfort, and  denies leg swelling, and any other symptoms.    MEDICAL HISTORY:  Past Medical History:  Diagnosis Date  . Cancer [facility])    Metastatic Breast Cancer    SURGICAL HISTORY: Past Surgical History:  Procedure Laterality Date  . CHOLECYSTECTOMY    . TONSILLECTOMY      SOCIAL HISTORY: Social History   Socioeconomic History  . Marital status: Single    Spouse name: Not on file  . Number of children: Not on file  . Years of education: Not on file  . Highest education level: Not on file  Occupational History  . Not on file  Social Needs  . Financial resource strain: Not on file  . Food insecurity:    Worry: Not on file    Inability: Not on file  . Transportation needs:    Medical: Not on file    Non-medical: Not on file  Tobacco Use  . Smoking status: Former Smoker    Packs/day: 0.00    Types: Cigarettes    Last attempt to quit: 01/21/2017    Years since quitting: 1.4  . Smokeless tobacco: Never Used  Substance and Sexual Activity  . Alcohol use: No    Alcohol/week: 0.0 standard drinks  . Drug use: No  . Sexual activity: Never  Lifestyle  . Physical activity:    Days per week: Not on file    Minutes per session: Not on file  . Stress: Not on file  Relationships  . Social connections:    Talks on phone:  Not on file    Gets together: Not on file    Attends religious service: Not on file    Active member of club or organization: Not on file    Attends meetings of clubs or organizations: Not on file    Relationship status: Not on file  . Intimate partner violence:    Fear of current or ex partner: Not on file    Emotionally abused: Not on file    Physically abused: Not on file    Forced sexual activity: Not on file  Other Topics Concern  . Not on file  Social History Narrative  . 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 Cancer Mother        breast  . Cancer Sister        breast  Mother with h/o breast cancer in her 87's Sister with breast cancer in her 53ys and later was diagnosed with multiple myeloma. (Patient is not aware of any specific breast cancer mutations present)  ALLERGIES:  is allergic to thorazine [chlorpromazine] and ribociclib.  MEDICATIONS:  Current Outpatient Medications  Medication Sig Dispense Refill  . abemaciclib (VERZENIO) 150 MG tablet TAKE 1 TABLET (150 MG TOTAL) BY MOUTH 2 TIMES DAILY. 56 tablet 3  . aspirin EC 81 MG tablet Take 1 tablet (81 mg total) by mouth daily.    [name] escitalopram (LEXAPRO) 20 MG tablet TAKE 1 TABLET(20 MG) BY MOUTH DAILY 30 tablet 0  . HYDROcodone-acetaminophen (NORCO/VICODIN) 5-325 MG tablet Take 1-2 tablets by mouth every 4 (four) hours as needed for moderate pain or severe pain. 20 tablet 0  . hydrOXYzine (ATARAX/VISTARIL) 10 MG tablet Take 2.5 tablets (25 mg total) by mouth 2 (two) times daily as needed for nausea or vomiting. 30 tablet 1  . letrozole (FEMARA) 2.5 MG tablet Take 1 tablet (2.58m) by mouth daily. 30 tablet 10  . letrozole (FEMARA) 2.5 MG tablet TAKE 1 TABLET(2.5 MG) BY MOUTH DAILY 30 tablet 0  . loperamide (IMODIUM) 1 MG/5ML solution Take 2 mg by mouth as needed for diarrhea or loose stools.     .[name] KitchenLORazepam (ATIVAN) 0.5 MG tablet Take 1 tablet (0.5 mg total) by mouth every 6 (six) hours as needed. for anxiety 60 tablet 0  . ondansetron (ZOFRAN) 4 MG tablet Take 1 tablet (4 mg total) by mouth 2 (two) times daily. Take with Verzenio. 60 tablet 0  . ondansetron (ZOFRAN) 4 MG tablet TAKE 1 TABLET(4 MG) BY MOUTH EVERY 8 HOURS AS NEEDED FOR NAUSEA OR VOMITING 60 tablet 0  . potassium chloride SA (K-DUR,KLOR-CON) 20 MEQ tablet Take 1 tablet (20 mEq total) by mouth 2 (two) times daily. 30 tablet 0  . Vitamin D, Ergocalciferol, (DRISDOL) 50000 units CAPS capsule TAKE 1 CAPSULE BY MOUTH 3 TIMES WEEKLY 24 capsule 0  . dexamethasone (DECADRON)  4 MG tablet Take 1 tablet (4 mg total) by mouth daily with breakfast. 30 tablet 0  . scopolamine (TRANSDERM-SCOP) 1 MG/3DAYS Place 1 patch (1.5 mg total) onto the skin every 3 (three) days. 10 patch 0   No current facility-administered medications for this visit.     REVIEW OF SYSTEMS:    A 10+ POINT REVIEW OF SYSTEMS WAS OBTAINED including neurology, dermatology, psychiatry, cardiac, respiratory, lymph, extremities, GI, GU, Musculoskeletal, constitutional, breasts, reproductive, HEENT.  All  pertinent positives are noted in the HPI.  All others are negative.   PHYSICAL EXAMINATION:  ECOG PERFORMANCE STATUS: 1 - Symptomatic but completely ambulatory  Vitals:   07/07/18 1243  BP: 93/64  Pulse: 89  Resp: 18  Temp: 98.7 F (37.1 C)  SpO2: 100%   Filed Weights   07/07/18 1243  Weight: 155 lb 8 oz (70.5 kg)   .Body mass index is 25.88 kg/m.  GENERAL:alert, in no acute distress and comfortable SKIN: no acute rashes, no significant lesions EYES: conjunctiva are pink and non-injected, sclera anicteric OROPHARYNX: MMM, no exudates, no oropharyngeal erythema or ulceration NECK: supple, no JVD LYMPH:  no palpable lymphadenopathy in the cervical, axillary or inguinal regions LUNGS: clear to auscultation b/l with normal respiratory effort HEART: regular rate & rhythm ABDOMEN:  normoactive bowel sounds , RUQ tenderness to palpation without guarding or rigidity, not distended. No palpable hepatosplenomegaly.  Extremity: no pedal edema PSYCH: alert & oriented x 3 with fluent speech NEURO: no focal motor/sensory deficits   LABORATORY DATA:  I have reviewed the data as listed  . CBC Latest Ref Rng & Units 07/07/2018 06/21/2018 05/24/2018  WBC 3.9 - 10.3 K/uL 4.5 4.3 4.6  Hemoglobin 11.6 - 15.9 g/dL 12.3 13.1 12.8  Hematocrit 34.8 - 46.6 % 37.7 39.3 39.0  Platelets 145 - 400 K/uL 107(L) 160 163  ANC 2.7k   CBC    Component Value Date/Time   WBC 4.5 07/07/2018 1200   RBC 3.78  07/07/2018 1200   RBC 3.78 07/07/2018 1200   HGB 12.3 07/07/2018 1200   HGB 12.8 05/24/2018 0905   HGB 13.9 11/24/2017 1340   HCT 37.7 07/07/2018 1200   HCT 42.0 11/24/2017 1340   PLT 107 (L) 07/07/2018 1200   PLT 163 05/24/2018 0905   PLT 212 11/24/2017 1340   MCV 99.7 07/07/2018 1200   MCV 97.0 11/24/2017 1340   MCH 32.5 07/07/2018 1200   MCHC 32.6 07/07/2018 1200   RDW 18.0 (H) 07/07/2018 1200   RDW 14.1 11/24/2017 1340   LYMPHSABS 0.8 (L) 07/07/2018 1200   LYMPHSABS 1.4 11/24/2017 1340   MONOABS 0.2 07/07/2018 1200   MONOABS 0.3 11/24/2017 1340   EOSABS 0.1 07/07/2018 1200   EOSABS 0.1 11/24/2017 1340   BASOSABS 0.0 07/07/2018 1200   BASOSABS 0.1 11/24/2017 1340    . CMP Latest Ref Rng & Units 07/07/2018 06/21/2018 05/24/2018  Glucose 70 - 99 mg/dL 137(H) 130(H) 135(H)  BUN 8 - 23 mg/dL _0 Creatinine 0.44 - 1.00 mg/dL 1.41(H) 1.27(H) 1.36(H)  Sodium 135 - 145 mmol/L 140 142 138  Potassium 3.5 - 5.1 mmol/L 3.1(L) 3.9 3.9  Chloride 98 - 111 mmol/L 102 105 104  CO2 22 - 32 mmol/L _1 Calcium 8.9 - 10.3 mg/dL 8.2(L) 9.6 9.2  Total Protein 6.5 - 8.1 g/dL 6.3(L) 6.9 6.8  Total Bilirubin 0.3 - 1.2 mg/dL 2.0(H) 0.6 0.3  Alkaline Phos 38 - 126 U/L 359(H) 233(H) 143(H)  AST 15 - 41 U/L 210(HH) 101(H) 48(H)  ALT 0 - 44 U/L 89(H) 49(H) 33             RADIOGRAPHIC STUDIES: I have personally reviewed the radiological images as listed and agreed with the findings in the report. Ct Chest W Contrast  Result Date: 07/06/2018 CLINICAL DATA:  Metastatic breast carcinoma. Recent episodes of vomiting. Some weight loss. EXAM: CT CHEST, ABDOMEN, AND PELVIS WITH CONTRAST TECHNIQUE: Multidetector CT imaging of the chest, abdomen  and pelvis was performed following the standard protocol during bolus administration of intravenous contrast. CONTRAST:  114m OMNIPAQUE IOHEXOL 300 MG/ML  SOLN COMPARISON:  CT 02/22/2018 FINDINGS: CT CHEST FINDINGS Cardiovascular: No significant  vascular findings. Normal heart size. No pericardial effusion. Mediastinum/Nodes: Axillary supraclavicular adenopathy. No mediastinal hilar adenopathy. No pericardial effusion. Esophagus normal. Lungs/Pleura: No suspicious pulmonary nodules Musculoskeletal: Multiple sclerotic lesions spine and ribs unchanged CT ABDOMEN AND PELVIS FINDINGS Hepatobiliary: There is new infiltrative pattern of enhancing lesions involving large portion of the central liver (segment 5, segment 8, 4A, and 4 B). This is in a region of a previously enhancing lesion in the RIGHT hepatic lobe which is now difficult define on the background this new infiltrative process. There are several discrete lesions including a 2.5 cm peripheral enhancing lesion in the central RIGHT hepatic lobe (image 47/2). More peripheral subcapsular lesions in the dome of liver with irregular peripheral enhancement measured 2.1 x 0.8 cm on image 42/2 and 1.6 by 2.3 cm on image 38/2. The infiltrative mass like pattern involves broad segment measuring 18 x 8 cm on image 52/2 for example. Very small amount of fluid along the RIGHT hepatic lobe and paraspinal liver no biliary duct dilatation. Portal veins patent. Postcholecystectomy. Pancreas: Pancreas is normal. No ductal dilatation. No pancreatic inflammation. Spleen: Small low-density lesions spleen is favored benign cysts. Adrenals/urinary tract: Adrenal glands, kidneys, ureters and bladder normal. Stomach/Bowel: Stomach, small bowel, appendix, and cecum are normal. The colon and rectosigmoid colon are normal. Vascular/Lymphatic: Abdominal aorta is normal caliber. There is no retroperitoneal or periportal lymphadenopathy. No pelvic lymphadenopathy. Reproductive: Uterus normal. Other: Small free fluid the pelvis. Musculoskeletal: Multiple densely sclerotic lesions in the pelvis and spine are not changed from comparison exam IMPRESSION: Chest Impression: 1. No evidence pulmonary metastasis or lymphadenopathy. 2. Stable  dense sclerotic skeletal metastasis. Abdomen / Pelvis Impression: 1. New pattern of heterogeneous enhancement and enhancing ill-defined lesions in the central LEFT and RIGHT hepatic lobe at site prior enhancing lesion is highly concerning for progression of of infiltrative malignancy in the liver occupying a large portion of the RIGHT hepatic lobe and central LEFT hepatic lobe. 2. Small amount of free fluid in the abdomen pelvis. 3. No metastatic adenopathy. 4. Stable  skeletal sclerotic metastasis. These results will be called to the ordering clinician or representative by the Radiologist Assistant, and communication documented in the PACS or zVision Dashboard. Electronically Signed   By: SSuzy BouchardM.D.   On: 07/06/2018 14:37   Ct Abdomen Pelvis W Contrast  Result Date: 07/06/2018 CLINICAL DATA:  Metastatic breast carcinoma. Recent episodes of vomiting. Some weight loss. EXAM: CT CHEST, ABDOMEN, AND PELVIS WITH CONTRAST TECHNIQUE: Multidetector CT imaging of the chest, abdomen and pelvis was performed following the standard protocol during bolus administration of intravenous contrast. CONTRAST:  1045mOMNIPAQUE IOHEXOL 300 MG/ML  SOLN COMPARISON:  CT 02/22/2018 FINDINGS: CT CHEST FINDINGS Cardiovascular: No significant vascular findings. Normal heart size. No pericardial effusion. Mediastinum/Nodes: Axillary supraclavicular adenopathy. No mediastinal hilar adenopathy. No pericardial effusion. Esophagus normal. Lungs/Pleura: No suspicious pulmonary nodules Musculoskeletal: Multiple sclerotic lesions spine and ribs unchanged CT ABDOMEN AND PELVIS FINDINGS Hepatobiliary: There is new infiltrative pattern of enhancing lesions involving large portion of the central liver (segment 5, segment 8, 4A, and 4 B). This is in a region of a previously enhancing lesion in the RIGHT hepatic lobe which is now difficult define on the background this new infiltrative process. There are several discrete lesions including a  2.5 cm  peripheral enhancing lesion in the central RIGHT hepatic lobe (image 47/2). More peripheral subcapsular lesions in the dome of liver with irregular peripheral enhancement measured 2.1 x 0.8 cm on image 42/2 and 1.6 by 2.3 cm on image 38/2. The infiltrative mass like pattern involves broad segment measuring 18 x 8 cm on image 52/2 for example. Very small amount of fluid along the RIGHT hepatic lobe and paraspinal liver no biliary duct dilatation. Portal veins patent. Postcholecystectomy. Pancreas: Pancreas is normal. No ductal dilatation. No pancreatic inflammation. Spleen: Small low-density lesions spleen is favored benign cysts. Adrenals/urinary tract: Adrenal glands, kidneys, ureters and bladder normal. Stomach/Bowel: Stomach, small bowel, appendix, and cecum are normal. The colon and rectosigmoid colon are normal. Vascular/Lymphatic: Abdominal aorta is normal caliber. There is no retroperitoneal or periportal lymphadenopathy. No pelvic lymphadenopathy. Reproductive: Uterus normal. Other: Small free fluid the pelvis. Musculoskeletal: Multiple densely sclerotic lesions in the pelvis and spine are not changed from comparison exam IMPRESSION: Chest Impression: 1. No evidence pulmonary metastasis or lymphadenopathy. 2. Stable dense sclerotic skeletal metastasis. Abdomen / Pelvis Impression: 1. New pattern of heterogeneous enhancement and enhancing ill-defined lesions in the central LEFT and RIGHT hepatic lobe at site prior enhancing lesion is highly concerning for progression of of infiltrative malignancy in the liver occupying a large portion of the RIGHT hepatic lobe and central LEFT hepatic lobe. 2. Small amount of free fluid in the abdomen pelvis. 3. No metastatic adenopathy. 4. Stable  skeletal sclerotic metastasis. These results will be called to the ordering clinician or representative by the Radiologist Assistant, and communication documented in the PACS or zVision Dashboard. Electronically Signed   By:  Suzy Bouchard M.D.   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 Multifocal tumor in the left breast with biopsy-proven left axillary metastases.   Noted to have extensive bone metastases and pulmonary metastases. Patient was noted to have calvarial metastases but no overt parenchymal metastasis She has been noted to have good clinical response and her tumor marker levels of progressively normalized. 09/14/17 CT Chest/ad/pelvis Results discussed in details - good response.   02/22/18 CT with the pt and her family which revealed  Newly apparent 2.1 by 2.0 cm rim enhancing lesion in the right hepatic lobe suspicious for a metastatic lesion. This was not apparent on prior exams although the prior exams were all noncontrast and thus the lesion may have been present but with reduced conspicuity. There is also a small enhancing lesion further posteriorly in the right hepatic lobe which is technically nonspecific and could be a small hemangioma or a small metastatic lesion. Stable distribution and appearance of prior sclerotic osseous metastatic disease without bony progression. 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 Much improved back pain.  # 3 Neutropenia Related to her Ribociclib - resolved. Patient is currently on Verzenio and has not developed any neutropenia. This is being monitored.  #4 Increased LFTs due to new liver metastases  #5 Cancer related pain - controlled - status post palliative RT to spine - no currently needing any pain medications.  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 No other new medication. Now resolved. Monitoring on increasing doses of Verzenio. No issues with  recurrent rash on  Verzenio 156m po BID  #7 Grade 1 diarrhea related to her Verzenio. She notes it is controlled with Imodium when necessary. We discussed that we would need to hold the medication noted in the dose of the diarrhea worsens. Patient is hesitant to reduce the dose of this point and notes that she will monitor it.   PLAN:    -Discussed pt labwork today, 07/07/18; worsening liver functions with AST at 210, ALT at 89, and Alk Phos at 359 -Discussed the 07/06/18 CT C/A/P which revealed New pattern of heterogeneous enhancement and enhancing ill-defined lesions in the central LEFT and RIGHT hepatic lobe at site prior enhancing lesion is highly concerning for progression of of infiltrative malignancy in the liver occupying a large portion of the RIGHT hepatic lobe and central LEFT hepatic lobe. 2. Small amount of free fluid in the abdomen pelvis. 3. No metastatic adenopathy.4. No evidence pulmonary metastasis or lymphadenopathy. 5. Stable dense sclerotic skeletal metastasis -Discussed that the liver metastasis warrants initiating systemic therapy, and will hold hormonal therapies during chemotherapy -Discussed the recommendation to biopsy the liver to understand if the chemical profiling of her disease has changed with the hormone receptors -Reviewed the NCCN guidelines with the pt and her family -Will stop Verzenio and Femara  -Scopolamine patch and low dose Dexamethasone for nausea and vomiting relief  -Will refer pt to IR for port placement -Continue Xgeva every 4 weeks -Continue to optimize nutritional status  -Discussed the consideration for combination chemotherapy of Doxorubicin and Cyclophosphamide with G-CSF support every 3 weeks -Will set the pt up for chemotherapy counseling -Planning to start chemotherapy in one week -Will refer the patient to our sEducation officer, museum-Providing a referral to a psychiatrist at the patient's request -Will order ECHO as well -Will see the back in 1 week    #8  Vitamin D deficiency Vit D levels 23.1 on 3/13. Back down to 19 Plan -Continue ergocalciferol at current dose. 50k units 3 times a week. Recommended she take this with fatty foods to improve absorption.   [place] biopsy of liver lesion ECHO Port a cath placement by IR Psych referral Chemo-counseling for Adriamycin/Cytoxan for metastatic breast cancer Plan to start chemotherapy in 1 week Nutritional referral RTC with Dr [facility]     All of the patients questions were answered with apparent satisfaction. The patient knows to call the clinic with any problems, questions or concerns.  The total time spent in the appt was 60 minutes and more than 50% was on counseling and direct patient cares.     GSullivan LoneMD MS AAHIVMS SBeaumont Hospital Royal OakCJackson HospitalHematology/Oncology Physician [facility] (Office):       [phone](Work cell):  [phone](Fax):           [phone] I, SBaldwin Jamaica am acting as a scribe for Dr. KIrene Limbo .I have reviewed the above documentation for accuracy and completeness, and I agree with the above. .Brunetta GeneraMD

## 2018-07-08 LAB — CANCER ANTIGEN 15-3: CAN 15 3: 624 U/mL — AB (ref 0.0–25.0)

## 2018-07-08 LAB — CANCER ANTIGEN 27.29: CA 27.29: 633.7 U/mL — ABNORMAL HIGH (ref 0.0–38.6)

## 2018-07-10 ENCOUNTER — Encounter: Payer: Self-pay | Admitting: *Deleted

## 2018-07-10 ENCOUNTER — Ambulatory Visit: Payer: BC Managed Care – PPO | Admitting: Nutrition

## 2018-07-10 ENCOUNTER — Other Ambulatory Visit: Payer: Self-pay | Admitting: Hematology

## 2018-07-10 ENCOUNTER — Inpatient Hospital Stay: Payer: BC Managed Care – PPO

## 2018-07-10 DIAGNOSIS — R188 Other ascites: Secondary | ICD-10-CM

## 2018-07-10 DIAGNOSIS — C50919 Malignant neoplasm of unspecified site of unspecified female breast: Secondary | ICD-10-CM

## 2018-07-10 DIAGNOSIS — C787 Secondary malignant neoplasm of liver and intrahepatic bile duct: Secondary | ICD-10-CM

## 2018-07-10 NOTE — Progress Notes (Signed)
62 year old female diagnosed with metastatic breast cancer.  She is a patient of Dr. Irene Limbo.  Past medical history was not significant.  Medications include Decadron, Lexapro, Imodium, Ativan, Zofran, and vitamin D.  Labs were reviewed.  Height: 5 feet 5 inches. Weight: 155.5 pounds. Usual body weight: 168 pounds in May. BMI: 25.88.  Patient reports nausea and vomiting on a daily basis. She started a scopolamine patch on Friday however has not noticed any difference in her nausea. She has a decreased appetite. She has been eating chicken noodle soup regularly. Patient denies constipation or diarrhea.  Nutrition diagnosis:  Unintended weight loss related to metastatic breast cancer and associated treatments as evidenced by 7% weight loss 3 months.  Intervention: Educated patient to consume smaller amounts of food often throughout the day. Reviewed high-calorie high-protein foods and encouraged weight maintenance. Strategies for improving nausea and vomiting with food choices. Encourage patient continue nausea medication as prescribed by MD. Recommended bowel regimen.  Encouraged increased fluid intake.  Monitoring, evaluation, goals: Patient will tolerate adequate calories and protein for weight maintenance.  Next visit: Monday, August 26 during infusion.  **Disclaimer: This note was dictated with voice recognition software. Similar sounding words can inadvertently be transcribed and this note may contain transcription errors which may not have been corrected upon publication of note.**

## 2018-07-10 NOTE — Progress Notes (Signed)
[place] Social Work  Clinical Social Work was referred by patient to review and complete healthcare advance directives.  Clinical Social Worker met with patient in [place] office.  The patient designated Jesse Fall Kimbrough as their primary healthcare agent and Crystall Donaldson as their secondary agent.  Patient also completed healthcare living will.    Clinical Social Worker notarized documents and made copies for patient/family. Clinical Social Worker will send documents to medical records to be scanned into patient's chart. Clinical Social Worker encouraged patient/family to contact with any additional questions or concerns.   Johnnye Lana, MSW, LCSW, OSW-C Clinical Social Worker [facility] [phone]

## 2018-07-11 ENCOUNTER — Ambulatory Visit (HOSPITAL_COMMUNITY)
Admission: RE | Admit: 2018-07-11 | Discharge: 2018-07-11 | Disposition: A | Discharge status: Home / Self Care | Payer: BC Managed Care – PPO | Source: Ambulatory Visit | Attending: Hematology | Admitting: Hematology

## 2018-07-11 DIAGNOSIS — C50919 Malignant neoplasm of unspecified site of unspecified female breast: Secondary | ICD-10-CM | POA: Insufficient documentation

## 2018-07-12 ENCOUNTER — Telehealth: Payer: Self-pay | Admitting: General Practice

## 2018-07-12 DIAGNOSIS — C787 Secondary malignant neoplasm of liver and intrahepatic bile duct: Secondary | ICD-10-CM | POA: Insufficient documentation

## 2018-07-12 MED ORDER — DEXAMETHASONE 4 MG PO TABS: ORAL_TABLET | ORAL | 1 refills | Status: DC

## 2018-07-12 MED ORDER — LIDOCAINE-PRILOCAINE 2.5-2.5 % EX CREA: TOPICAL_CREAM | CUTANEOUS | 3 refills | Status: DC

## 2018-07-12 NOTE — Telephone Encounter (Signed)
[place] CSW Progress Notes  Call from patient's sister, Henrene Hawking [phone]).  Wants information on available services to help in home while patient is in chemotherapy "like visiting nurses or home health aides"  CSW attempted to call patient to verify consent to speak w sister, awaiting call back from patient who was unable to speak at this time.  Edwyna Shell, LCSW Clinical Social Worker Phone:  [phone]

## 2018-07-12 NOTE — Telephone Encounter (Signed)
[place] CSW Progress Notes  Spoke w patient - she has located someone to provide help at home.  She will call sister to reassure her that patient has what she needs at this time.   Edwyna Shell, LCSW Clinical Social Worker Phone:  [phone]

## 2018-07-12 NOTE — Progress Notes (Signed)
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

## 2018-07-13 ENCOUNTER — Telehealth: Payer: Self-pay

## 2018-07-13 ENCOUNTER — Other Ambulatory Visit: Payer: Self-pay | Admitting: Radiology

## 2018-07-13 NOTE — Telephone Encounter (Signed)
Per 8/22 scheduled message to move infusion appointments, and adjust injection appointment by 48 hrs. Spoke with patient and we scheduled her appointments over the phone.

## 2018-07-13 NOTE — Telephone Encounter (Signed)
Patient scheduled to start chemo on Monday 07/17/18. Patient has a liver biopsy/paracentesis scheduled for Tuesday 07/18/18. Dr. Irene Limbo made aware and stated patient needs biopsy prior to chemo. Called [facility] to attempt to get liver biopsy sooner. No appointments available per [employer] [facility] in [employer]. Scheduling message sent to move infusion appointment to Wednesday 07/19/18 per Dr. Irene Limbo. Patient aware to expect a call from scheduling with time of appointment.

## 2018-07-14 ENCOUNTER — Encounter (HOSPITAL_COMMUNITY): Payer: Self-pay

## 2018-07-14 ENCOUNTER — Ambulatory Visit (HOSPITAL_COMMUNITY)
Admission: RE | Admit: 2018-07-14 | Discharge: 2018-07-14 | Disposition: A | Discharge status: Home / Self Care | Payer: BC Managed Care – PPO | Source: Ambulatory Visit | Attending: Hematology | Admitting: Hematology

## 2018-07-14 ENCOUNTER — Telehealth: Payer: Self-pay | Admitting: Hematology

## 2018-07-14 ENCOUNTER — Other Ambulatory Visit: Payer: Self-pay

## 2018-07-14 DIAGNOSIS — Z7982 Long term (current) use of aspirin: Secondary | ICD-10-CM | POA: Insufficient documentation

## 2018-07-14 DIAGNOSIS — C50919 Malignant neoplasm of unspecified site of unspecified female breast: Secondary | ICD-10-CM | POA: Diagnosis not present

## 2018-07-14 DIAGNOSIS — Z87891 Personal history of nicotine dependence: Secondary | ICD-10-CM | POA: Insufficient documentation

## 2018-07-14 DIAGNOSIS — C7951 Secondary malignant neoplasm of bone: Secondary | ICD-10-CM | POA: Diagnosis not present

## 2018-07-14 HISTORY — PX: IR IMAGING GUIDED PORT INSERTION: IMG5740

## 2018-07-14 LAB — BASIC METABOLIC PANEL
Anion gap: 10 (ref 5–15)
BUN: 14 mg/dL (ref 8–23)
CO2: 24 mmol/L (ref 22–32)
CREATININE: 0.9 mg/dL (ref 0.44–1.00)
Calcium: 8.5 mg/dL — ABNORMAL LOW (ref 8.9–10.3)
Chloride: 105 mmol/L (ref 98–111)
GFR calc Af Amer: >60 mL/min (ref >60)
Glucose, Bld: 111 mg/dL — ABNORMAL HIGH (ref 70–99)
Potassium: 3.2 mmol/L — ABNORMAL LOW (ref 3.5–5.1)
Sodium: 139 mmol/L (ref 135–145)

## 2018-07-14 LAB — CBC WITH DIFFERENTIAL/PLATELET
BASOS ABS: 0 10*3/uL (ref 0.0–0.1)
Basophils Relative: 0 %
EOS ABS: 0 10*3/uL (ref 0.0–0.7)
Eosinophils Relative: 0 %
HCT: 35.1 % — ABNORMAL LOW (ref 36.0–46.0)
Hemoglobin: 11.9 g/dL — ABNORMAL LOW (ref 12.0–15.0)
Lymphocytes Relative: 12 %
Lymphs Abs: 0.8 10*3/uL (ref 0.7–4.0)
MCH: 33.2 pg (ref 26.0–34.0)
MCHC: 33.9 g/dL (ref 30.0–36.0)
MCV: 98 fL (ref 78.0–100.0)
Monocytes Absolute: 1 10*3/uL (ref 0.1–1.0)
Monocytes Relative: 15 %
Neutro Abs: 4.9 10*3/uL (ref 1.7–7.7)
Neutrophils Relative %: 73 %
PLATELETS: 105 10*3/uL — AB (ref 150–400)
RBC: 3.58 MIL/uL — AB (ref 3.87–5.11)
RDW: 18.4 % — AB (ref 11.5–15.5)
WBC: 6.8 10*3/uL (ref 4.0–10.5)

## 2018-07-14 LAB — PROTIME-INR
INR: 1.45
PROTHROMBIN TIME: 17.5 s — AB (ref 11.4–15.2)

## 2018-07-14 MED ORDER — LIDOCAINE HCL 1 % IJ SOLN
INTRAMUSCULAR | Status: AC
  Filled 2018-07-14: qty 20

## 2018-07-14 MED ORDER — CEFAZOLIN SODIUM-DEXTROSE 2-4 GM/100ML-% IV SOLN
2.0000 g | INTRAVENOUS | Status: AC
  Administered 2018-07-14: 2 g via INTRAVENOUS
  Filled 2018-07-14: qty 100

## 2018-07-14 MED ORDER — LIDOCAINE-EPINEPHRINE (PF) 2 %-1:200000 IJ SOLN
INTRAMUSCULAR | Status: AC
  Filled 2018-07-14: qty 20

## 2018-07-14 MED ORDER — FENTANYL CITRATE (PF) 100 MCG/2ML IJ SOLN
INTRAMUSCULAR | Status: AC
  Filled 2018-07-14: qty 2

## 2018-07-14 MED ORDER — FENTANYL CITRATE (PF) 100 MCG/2ML IJ SOLN
INTRAMUSCULAR | Status: AC | PRN
  Administered 2018-07-14: 50 ug via INTRAVENOUS
  Administered 2018-07-14 (×2): 25 ug via INTRAVENOUS

## 2018-07-14 MED ORDER — NALOXONE HCL 0.4 MG/ML IJ SOLN
INTRAMUSCULAR | Status: AC
  Filled 2018-07-14: qty 1

## 2018-07-14 MED ORDER — HEPARIN SOD (PORK) LOCK FLUSH 100 UNIT/ML IV SOLN
INTRAVENOUS | Status: AC
  Filled 2018-07-14: qty 5

## 2018-07-14 MED ORDER — SODIUM CHLORIDE 0.9 % IV SOLN
INTRAVENOUS | Status: DC
  Administered 2018-07-14: 13:00:00 via INTRAVENOUS

## 2018-07-14 MED ORDER — LIDOCAINE-EPINEPHRINE (PF) 2 %-1:200000 IJ SOLN
INTRAMUSCULAR | Status: AC | PRN
  Administered 2018-07-14: 20 mL

## 2018-07-14 MED ORDER — CEFAZOLIN SODIUM-DEXTROSE 2-4 GM/100ML-% IV SOLN
INTRAVENOUS | Status: AC
  Filled 2018-07-14: qty 100

## 2018-07-14 MED ORDER — MIDAZOLAM HCL 2 MG/2ML IJ SOLN
INTRAMUSCULAR | Status: AC
  Filled 2018-07-14: qty 4

## 2018-07-14 MED ORDER — HEPARIN SOD (PORK) LOCK FLUSH 100 UNIT/ML IV SOLN
INTRAVENOUS | Status: AC | PRN
  Administered 2018-07-14: 500 [IU] via INTRAVENOUS

## 2018-07-14 MED ORDER — FLUMAZENIL 0.5 MG/5ML IV SOLN
INTRAVENOUS | Status: AC
  Filled 2018-07-14: qty 5

## 2018-07-14 MED ORDER — MIDAZOLAM HCL 2 MG/2ML IJ SOLN
INTRAMUSCULAR | Status: AC | PRN
  Administered 2018-07-14 (×3): 1 mg via INTRAVENOUS

## 2018-07-14 NOTE — Progress Notes (Signed)
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  INTERVAL HISTORY   Stacey Cox is here for follow-up of her metastatic hormone positive HER-2 negative breast cancer. The patient's last visit with [place] was on 07/07/18. She is accompanied today by her sister. The pt reports that she is doing well overall.   The pt reports that her nausea medication has greatly helped her, she has begun eating better, and has gained 8 pounds without leg swelling. The pt notes that she has not had to use any pain medication for her abdominal pain, which she notes is stable. The pt notes that she saw our nutritional therapist Ernestene Kiel and is focusing on grazing as opposed to eating three meals. She adds that she is vomiting less, and has been sleeping more.   The pt adds that she has had some increased heart burn while taking steroids.   Lab results today (07/17/18) of CBC w/diff, CMP is as follows: all values are WNL except for RBC at 3.29, HGB at 10.9, HCT at 32.3, RDW at 19.1, PLT at 81k, Lymphs abs at 700, nRBC at 2, Potassium at 3.0, Glucose at 203, Calcium at 8.1, Total Protein at 5.5, Albumin at 2.3, AST at 298, ALT at 130, Alk Phos at 421, Total Bilirubin at 3.2.   On review of systems, pt reports weight gain, controlled nausea, stable upper right abdominal pain, eating well, heart burn, more fatigue, and denies leg swelling, problems with her port, and any other symptoms.   MEDICAL HISTORY:  Past Medical History:  Diagnosis Date  . Cancer [facility])    Metastatic Breast Cancer    SURGICAL HISTORY: Past Surgical History:  Procedure Laterality Date  . CHOLECYSTECTOMY    . IR IMAGING GUIDED PORT INSERTION  07/14/2018  .  TONSILLECTOMY      SOCIAL HISTORY: Social History   Socioeconomic History  . Marital status: Single    Spouse name: Not on file  . Number of children: Not on file  . Years of education: Not on file  . Highest education level: Not on file  Occupational History  . Not on file  Social Needs  . Financial resource strain: Not on file  . Food insecurity:    Worry: Not on file    Inability: Not on file  . Transportation needs:    Medical: Not on file    Non-medical: Not on file  Tobacco Use  . Smoking status: Former Smoker    Packs/day: 0.00    Types: Cigarettes    Last attempt to quit: 01/21/2017    Years since quitting: 1.4  . Smokeless tobacco: Never Used  Substance and Sexual Activity  . Alcohol use: No    Alcohol/week: 0.0 standard drinks  . Drug use: No  . Sexual activity: Never  Lifestyle  . Physical activity:    Days per week: Not on file    Minutes per session: Not on file  . Stress: Not on file  Relationships  . Social connections:    Talks on phone: Not on file    Gets together: Not on file    Attends religious service: Not on file    Active member of club or  organization: Not on file    Attends meetings of clubs or organizations: Not on file    Relationship status: Not on file  . Intimate partner violence:    Fear of current or ex partner: Not on file    Emotionally abused: Not on file    Physically abused: Not on file    Forced sexual activity: Not on file  Other Topics Concern  . Not on file  Social History Narrative  . 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 Cancer Mother        breast  . Cancer Sister        breast  Mother with h/o breast cancer in her 52's Sister with breast cancer in her 31ys and later was diagnosed with multiple myeloma. (Patient is not aware of any specific breast cancer  mutations present)  ALLERGIES:  is allergic to thorazine [chlorpromazine] and ribociclib.  MEDICATIONS:  Current Outpatient Medications  Medication Sig Dispense Refill  . abemaciclib (VERZENIO) 150 MG tablet TAKE 1 TABLET (150 MG TOTAL) BY MOUTH 2 TIMES DAILY. 56 tablet 3  . aspirin EC 81 MG tablet Take 1 tablet (81 mg total) by mouth daily.    Stacey Cox dexamethasone (DECADRON) 4 MG tablet Take 1 tablet (4 mg total) by mouth daily with breakfast. 30 tablet 0  . dexamethasone (DECADRON) 4 MG tablet Take 2 tablets by mouth once a day on the day after chemotherapy and then take 2 tablets two times a day for 2 days. Take with food. 30 tablet 1  . escitalopram (LEXAPRO) 20 MG tablet TAKE 1 TABLET(20 MG) BY MOUTH DAILY 30 tablet 0  . HYDROcodone-acetaminophen (NORCO/VICODIN) 5-325 MG tablet Take 1-2 tablets by mouth every 4 (four) hours as needed for moderate pain or severe pain. 20 tablet 0  . hydrOXYzine (ATARAX/VISTARIL) 10 MG tablet Take 2.5 tablets (25 mg total) by mouth 2 (two) times daily as needed for nausea or vomiting. 30 tablet 1  . letrozole (FEMARA) 2.5 MG tablet Take 1 tablet (2.53m) by mouth daily. 30 tablet 10  . letrozole (FEMARA) 2.5 MG tablet TAKE 1 TABLET(2.5 MG) BY MOUTH DAILY 30 tablet 0  . lidocaine-prilocaine (EMLA) cream Apply to affected area once 30 g 3  . loperamide (IMODIUM) 1 MG/5ML solution Take 2 mg by mouth as needed for diarrhea or loose stools.     .Stacey KitchenLORazepam (ATIVAN) 0.5 MG tablet Take 1 tablet (0.5 mg total) by mouth every 6 (six) hours as needed. for anxiety 60 tablet 0  . ondansetron (ZOFRAN) 4 MG tablet Take 1 tablet (4 mg total) by mouth 2 (two) times daily. Take with Verzenio. 60 tablet 0  . ondansetron (ZOFRAN) 4 MG tablet TAKE 1 TABLET(4 MG) BY MOUTH EVERY 8 HOURS AS NEEDED FOR NAUSEA OR VOMITING 60 tablet 0  . potassium chloride SA (K-DUR,KLOR-CON) 20 MEQ tablet Take 1 tablet (20 mEq total) by mouth 2 (two) times daily. 30 tablet 0  . scopolamine  (TRANSDERM-SCOP) 1 MG/3DAYS Place 1 patch (1.5 mg total) onto the skin every 3 (three) days. 10 patch 0  . Vitamin D, Ergocalciferol, (DRISDOL) 50000 units CAPS capsule TAKE 1 CAPSULE BY MOUTH 3 TIMES WEEKLY 24 capsule 0   No current facility-administered medications for this visit.     REVIEW OF SYSTEMS:    A 10+  POINT REVIEW OF SYSTEMS WAS OBTAINED including neurology, dermatology, psychiatry, cardiac, respiratory, lymph, extremities, GI, GU, Musculoskeletal, constitutional, breasts, reproductive, HEENT.  All pertinent positives are noted in the HPI.  All others are negative.   PHYSICAL EXAMINATION:  ECOG PERFORMANCE STATUS: 1 - Symptomatic but completely ambulatory  Vitals:   07/17/18 1237  BP: 104/73  Pulse: 90  Resp: 18  Temp: 98.9 F (37.2 C)  SpO2: 100%   Filed Weights   07/17/18 1237  Weight: 163 lb 9.6 oz (74.2 kg)   .Body mass index is 27.22 kg/m.  GENERAL:alert, in no acute distress and comfortable SKIN: no acute rashes, no significant lesions EYES: conjunctiva are pink and non-injected, sclera anicteric OROPHARYNX: MMM, no exudates, no oropharyngeal erythema or ulceration NECK: supple, no JVD LYMPH:  no palpable lymphadenopathy in the cervical, axillary or inguinal regions LUNGS: clear to auscultation b/l with normal respiratory effort HEART: regular rate & rhythm ABDOMEN:  normoactive bowel sounds , RUQ tenderness to palpation without guarding or rigidity, not distended. No palpable hepatosplenomegaly.  Extremity: no pedal edema PSYCH: alert & oriented x 3 with fluent speech NEURO: no focal motor/sensory deficits    LABORATORY DATA:  I have reviewed the data as listed  . CBC Latest Ref Rng & Units 07/17/2018 07/14/2018 07/07/2018  WBC 3.9 - 10.3 K/uL 6.6 6.8 4.5  Hemoglobin 11.6 - 15.9 g/dL 10.9(L) 11.9(L) 12.3  Hematocrit 34.8 - 46.6 % 32.3(L) 35.1(L) 37.7  Platelets 145 - 400 K/uL 81(L) 105(L) 107(L)  ANC 2.7k   CBC    Component Value Date/Time    WBC 6.6 07/17/2018 1146   RBC 3.29 (L) 07/17/2018 1146   HGB 10.9 (L) 07/17/2018 1146   HGB 12.8 05/24/2018 0905   HGB 13.9 11/24/2017 1340   HCT 32.3 (L) 07/17/2018 1146   HCT 42.0 11/24/2017 1340   PLT 81 (L) 07/17/2018 1146   PLT 163 05/24/2018 0905   PLT 212 11/24/2017 1340   MCV 98.2 07/17/2018 1146   MCV 97.0 11/24/2017 1340   MCH 33.1 07/17/2018 1146   MCHC 33.7 07/17/2018 1146   RDW 19.1 (H) 07/17/2018 1146   RDW 14.1 11/24/2017 1340   LYMPHSABS 0.7 (L) 07/17/2018 1146   LYMPHSABS 1.4 11/24/2017 1340   MONOABS 0.7 07/17/2018 1146   MONOABS 0.3 11/24/2017 1340   EOSABS 0.0 07/17/2018 1146   EOSABS 0.1 11/24/2017 1340   BASOSABS 0.0 07/17/2018 1146   BASOSABS 0.1 11/24/2017 1340    . CMP Latest Ref Rng & Units 07/17/2018 07/14/2018 07/07/2018  Glucose 70 - 99 mg/dL 203(H) 111(H) 137(H)  BUN 8 - 23 mg/dL _0 Creatinine 0.44 - 1.00 mg/dL 0.82 0.90 1.41(H)  Sodium 135 - 145 mmol/L 137 139 140  Potassium 3.5 - 5.1 mmol/L 3.0(LL) 3.2(L) 3.1(L)  Chloride 98 - 111 mmol/L 101 105 102  CO2 22 - 32 mmol/L _1 Calcium 8.9 - 10.3 mg/dL 8.1(L) 8.5(L) 8.2(L)  Total Protein 6.5 - 8.1 g/dL 5.5(L) - 6.3(L)  Total Bilirubin 0.3 - 1.2 mg/dL 3.2(H) - 2.0(H)  Alkaline Phos 38 - 126 U/L 421(H) - 359(H)  AST 15 - 41 U/L 298(HH) - 210(HH)  ALT 0 - 44 U/L 130(H) - 89(H)             RADIOGRAPHIC STUDIES: I have personally reviewed the radiological images as listed and agreed with the findings in the report. Ct Chest W Contrast  Result Date: 07/06/2018 CLINICAL DATA:  Metastatic breast carcinoma. Recent episodes  of vomiting. Some weight loss. EXAM: CT CHEST, ABDOMEN, AND PELVIS WITH CONTRAST TECHNIQUE: Multidetector CT imaging of the chest, abdomen and pelvis was performed following the standard protocol during bolus administration of intravenous contrast. CONTRAST:  126m OMNIPAQUE IOHEXOL 300 MG/ML  SOLN COMPARISON:  CT 02/22/2018 FINDINGS: CT CHEST FINDINGS  Cardiovascular: No significant vascular findings. Normal heart size. No pericardial effusion. Mediastinum/Nodes: Axillary supraclavicular adenopathy. No mediastinal hilar adenopathy. No pericardial effusion. Esophagus normal. Lungs/Pleura: No suspicious pulmonary nodules Musculoskeletal: Multiple sclerotic lesions spine and ribs unchanged CT ABDOMEN AND PELVIS FINDINGS Hepatobiliary: There is new infiltrative pattern of enhancing lesions involving large portion of the central liver (segment 5, segment 8, 4A, and 4 B). This is in a region of a previously enhancing lesion in the RIGHT hepatic lobe which is now difficult define on the background this new infiltrative process. There are several discrete lesions including a 2.5 cm peripheral enhancing lesion in the central RIGHT hepatic lobe (image 47/2). More peripheral subcapsular lesions in the dome of liver with irregular peripheral enhancement measured 2.1 x 0.8 cm on image 42/2 and 1.6 by 2.3 cm on image 38/2. The infiltrative mass like pattern involves broad segment measuring 18 x 8 cm on image 52/2 for example. Very small amount of fluid along the RIGHT hepatic lobe and paraspinal liver no biliary duct dilatation. Portal veins patent. Postcholecystectomy. Pancreas: Pancreas is normal. No ductal dilatation. No pancreatic inflammation. Spleen: Small low-density lesions spleen is favored benign cysts. Adrenals/urinary tract: Adrenal glands, kidneys, ureters and bladder normal. Stomach/Bowel: Stomach, small bowel, appendix, and cecum are normal. The colon and rectosigmoid colon are normal. Vascular/Lymphatic: Abdominal aorta is normal caliber. There is no retroperitoneal or periportal lymphadenopathy. No pelvic lymphadenopathy. Reproductive: Uterus normal. Other: Small free fluid the pelvis. Musculoskeletal: Multiple densely sclerotic lesions in the pelvis and spine are not changed from comparison exam IMPRESSION: Chest Impression: 1. No evidence pulmonary metastasis  or lymphadenopathy. 2. Stable dense sclerotic skeletal metastasis. Abdomen / Pelvis Impression: 1. New pattern of heterogeneous enhancement and enhancing ill-defined lesions in the central LEFT and RIGHT hepatic lobe at site prior enhancing lesion is highly concerning for progression of of infiltrative malignancy in the liver occupying a large portion of the RIGHT hepatic lobe and central LEFT hepatic lobe. 2. Small amount of free fluid in the abdomen pelvis. 3. No metastatic adenopathy. 4. Stable  skeletal sclerotic metastasis. These results will be called to the ordering clinician or representative by the Radiologist Assistant, and communication documented in the PACS or zVision Dashboard. Electronically Signed   By: SSuzy BouchardM.D.   On: 07/06/2018 14:37   Ct Abdomen Pelvis W Contrast  Result Date: 07/06/2018 CLINICAL DATA:  Metastatic breast carcinoma. Recent episodes of vomiting. Some weight loss. EXAM: CT CHEST, ABDOMEN, AND PELVIS WITH CONTRAST TECHNIQUE: Multidetector CT imaging of the chest, abdomen and pelvis was performed following the standard protocol during bolus administration of intravenous contrast. CONTRAST:  1037mOMNIPAQUE IOHEXOL 300 MG/ML  SOLN COMPARISON:  CT 02/22/2018 FINDINGS: CT CHEST FINDINGS Cardiovascular: No significant vascular findings. Normal heart size. No pericardial effusion. Mediastinum/Nodes: Axillary supraclavicular adenopathy. No mediastinal hilar adenopathy. No pericardial effusion. Esophagus normal. Lungs/Pleura: No suspicious pulmonary nodules Musculoskeletal: Multiple sclerotic lesions spine and ribs unchanged CT ABDOMEN AND PELVIS FINDINGS Hepatobiliary: There is new infiltrative pattern of enhancing lesions involving large portion of the central liver (segment 5, segment 8, 4A, and 4 B). This is in a region of a previously enhancing lesion in the RIGHT hepatic lobe which  is now difficult define on the background this new infiltrative process. There are several  discrete lesions including a 2.5 cm peripheral enhancing lesion in the central RIGHT hepatic lobe (image 47/2). More peripheral subcapsular lesions in the dome of liver with irregular peripheral enhancement measured 2.1 x 0.8 cm on image 42/2 and 1.6 by 2.3 cm on image 38/2. The infiltrative mass like pattern involves broad segment measuring 18 x 8 cm on image 52/2 for example. Very small amount of fluid along the RIGHT hepatic lobe and paraspinal liver no biliary duct dilatation. Portal veins patent. Postcholecystectomy. Pancreas: Pancreas is normal. No ductal dilatation. No pancreatic inflammation. Spleen: Small low-density lesions spleen is favored benign cysts. Adrenals/urinary tract: Adrenal glands, kidneys, ureters and bladder normal. Stomach/Bowel: Stomach, small bowel, appendix, and cecum are normal. The colon and rectosigmoid colon are normal. Vascular/Lymphatic: Abdominal aorta is normal caliber. There is no retroperitoneal or periportal lymphadenopathy. No pelvic lymphadenopathy. Reproductive: Uterus normal. Other: Small free fluid the pelvis. Musculoskeletal: Multiple densely sclerotic lesions in the pelvis and spine are not changed from comparison exam IMPRESSION: Chest Impression: 1. No evidence pulmonary metastasis or lymphadenopathy. 2. Stable dense sclerotic skeletal metastasis. Abdomen / Pelvis Impression: 1. New pattern of heterogeneous enhancement and enhancing ill-defined lesions in the central LEFT and RIGHT hepatic lobe at site prior enhancing lesion is highly concerning for progression of of infiltrative malignancy in the liver occupying a large portion of the RIGHT hepatic lobe and central LEFT hepatic lobe. 2. Small amount of free fluid in the abdomen pelvis. 3. No metastatic adenopathy. 4. Stable  skeletal sclerotic metastasis. These results will be called to the ordering clinician or representative by the Radiologist Assistant, and communication documented in the PACS or zVision  Dashboard. Electronically Signed   By: Suzy Bouchard M.D.   On: 07/06/2018 14:37   Ir Imaging Guided Port Insertion  Result Date: 07/14/2018 CLINICAL DATA:  Metastatic breast cancer EXAM: RIGHT INTERNAL JUGULAR SINGLE LUMEN POWER PORT CATHETER INSERTION Date:  07/14/2018 07/14/2018 4:04 pm Radiologist:  Jerilynn Mages. Daryll Brod, MD Guidance:  Ultrasound fluoroscopic MEDICATIONS: Ancef 2 g; The antibiotic was administered within an appropriate time interval prior to skin puncture. ANESTHESIA/SEDATION: Versed 3.0 mg IV; Fentanyl 100 mcg IV; Moderate Sedation Time:  23 minutes The patient was continuously monitored during the procedure by the interventional radiology nurse under my direct supervision. FLUOROSCOPY TIME:  0 minutes, 36 seconds (5 mGy) COMPLICATIONS: None immediate. CONTRAST:  None. PROCEDURE: Informed consent was obtained from the patient following explanation of the procedure, risks, benefits and alternatives. The patient understands, agrees and consents for the procedure. All questions were addressed. A time out was performed. Maximal barrier sterile technique utilized including caps, mask, sterile gowns, sterile gloves, large sterile drape, hand hygiene, and 2% chlorhexidine scrub. Under sterile conditions and local anesthesia, right internal jugular micropuncture venous access was performed. Access was performed with ultrasound. Images were obtained for documentation of the patent right internal jugular vein. A guide wire was inserted followed by a transitional dilator. This allowed insertion of a guide wire and catheter into the IVC. Measurements were obtained from the SVC / RA junction back to the right IJ venotomy site. In the right infraclavicular chest, a subcutaneous pocket was created over the second anterior rib. This was done under sterile conditions and local anesthesia. 1% lidocaine with epinephrine was utilized for this. A 2.5 cm incision was made in the skin. Blunt dissection was performed  to create a subcutaneous pocket over the right pectoralis  major muscle. The pocket was flushed with saline vigorously. There was adequate hemostasis. The port catheter was assembled and checked for leakage. The port catheter was secured in the pocket with two retention sutures. The tubing was tunneled subcutaneously to the right venotomy site and inserted into the SVC/RA junction through a valved peel-away sheath. Position was confirmed with fluoroscopy. Images were obtained for documentation. The patient tolerated the procedure well. No immediate complications. Incisions were closed in a two layer fashion with 4 - 0 Vicryl suture. Dermabond was applied to the skin. The port catheter was accessed, blood was aspirated followed by saline and heparin flushes. Needle was removed. A dry sterile dressing was applied. IMPRESSION: Ultrasound and fluoroscopically guided right internal jugular single lumen power port catheter insertion. Tip in the SVC/RA junction. Catheter ready for use. Electronically Signed   By: Jerilynn Mages.  Shick M.D.   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 Multifocal tumor in the left breast with biopsy-proven left axillary metastases.   Noted to have extensive bone metastases and pulmonary metastases. Patient was noted to have calvarial metastases but no overt parenchymal metastasis She has been noted to have good clinical response and her tumor marker levels of progressively normalized. 09/14/17 CT Chest/ad/pelvis Results discussed in details - good response.  02/22/18 CT revealed  Newly apparent 2.1 by 2.0 cm rim enhancing lesion in the right hepatic lobe suspicious for a metastatic lesion. This was not apparent on prior exams although the prior exams were all noncontrast and thus the lesion may have been present but with reduced conspicuity. There is also a  small enhancing lesion further posteriorly in the right hepatic lobe which is technically nonspecific and could be a small hemangioma or a small metastatic lesion. Stable distribution and appearance of prior sclerotic osseous metastatic disease without bony progression. 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   07/06/18 CT C/A/P which revealed New pattern of heterogeneous enhancement and enhancing ill-defined lesions in the central LEFT and RIGHT hepatic lobe at site prior enhancing lesion is highly concerning for progression of of infiltrative malignancy in the liver occupying a large portion of the RIGHT hepatic lobe and central LEFT hepatic lobe. 2. Small amount of free fluid in the abdomen pelvis. 3. No metastatic adenopathy.4. No evidence pulmonary metastasis or lymphadenopathy. 5. 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 Much improved back pain.  # 3 Neutropenia Related to her Ribociclib - resolved. Patient is currently on Verzenio and has not developed any neutropenia. This is being monitored.  #4 Increased LFTs due to new liver metastases  #5 Cancer related pain - controlled - status post palliative RT to spine - no currently needing any pain medications.  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 No other new medication. Now resolved. Monitoring on increasing doses of Verzenio. No issues with recurrent rash on Verzenio 129m po BID   PLAN:  -Discussed pt labwork today, 07/17/18; HGB decreased to 10.9, PLT decreased to 81k, Potassium decreased to 3.0, liver functions continuing to worsen -Take Potassium replacement -Rapidly worsening liver functions suggestive of aggressive process, considering adding on Faslodex as well -Discussed that the 07/11/18 ECHO revealed LV EF of 55%-60% -Proceed with [place]opsy  Liver tomorrow 07/18/18 -Advised  taking Claritin for Neulasta related pain -Continue with Xgeva every 4 weeks - hold 1 dose -Hold Femara during chemotherapy cycles -Recommend Nexium OTC for steroid-related heartburn -Advised that the pt maintain crowd-avoidance strategies and avoid individuals with infections -The pt has no prohibitive toxicities from beginning C1 of Doxorubicin, Cyclophosphamide and Paclitaxel at this time.   -Advised that the pt continue eating well -Will watch blood counts once a week -Will see pt back in one week with toxicity check    #8 Vitamin D deficiency Vit D levels 23.1 on 3/13. Back down to 19 Plan -Continue ergocalciferol at current dose. 50k units 3 times a week. Recommended she take this with fatty foods to improve absorption.   Cancel nutritionist appointment for today . Cancel PET/CT scheduled for 8/29 RTC with Dr Irene Limbo with labs in 07/25/2018 at 4:30pm    All of the patients questions were answered with apparent satisfaction. The patient knows to call the clinic with any problems, questions or concerns.  The total time spent in the appt was 25 minutes and more than 50% was on counseling and direct patient cares.     Sullivan Lone MD MS AAHIVMS [facility] Hematology/Oncology Physician [facility]  (Office):       [phone] (Work cell):  [phone] (Fax):           [phone]  I, Baldwin Jamaica, am acting as a scribe for Dr. Irene Limbo  .I have reviewed the above documentation for accuracy and completeness, and I agree with the above. [name] MD

## 2018-07-14 NOTE — Procedures (Signed)
Breast ca  S/p RT IJ POWER PORT  Tip svcra No comp Stable EBL min Full report in pacs

## 2018-07-14 NOTE — Discharge Instructions (Signed)
Implanted Port Home Guide °An implanted port is a type of central line that is placed under the skin. Central lines are used to provide IV access when treatment or nutrition needs to be given through a person’s veins. Implanted ports are used for long-term IV access. An implanted port may be placed because: °· You need IV medicine that would be irritating to the small veins in your hands or arms. °· You need long-term IV medicines, such as antibiotics. °· You need IV nutrition for a long period. °· You need frequent blood draws for lab tests. °· You need dialysis. ° °Implanted ports are usually placed in the chest area, but they can also be placed in the upper arm, the abdomen, or the leg. An implanted port has two main parts: °· Reservoir. The reservoir is round and will appear as a small, raised area under your skin. The reservoir is the part where a needle is inserted to give medicines or draw blood. °· Catheter. The catheter is a thin, flexible tube that extends from the reservoir. The catheter is placed into a large vein. Medicine that is inserted into the reservoir goes into the catheter and then into the vein. ° °How will I care for my incision site? °Do not get the incision site wet. Bathe or shower as directed by your health care provider. °How is my port accessed? °Special steps must be taken to access the port: °· Before the port is accessed, a numbing cream can be placed on the skin. This helps numb the skin over the port site. °· Your health care provider uses a sterile technique to access the port. °? Your health care provider must put on a mask and sterile gloves. °? The skin over your port is cleaned carefully with an antiseptic and allowed to dry. °? The port is gently pinched between sterile gloves, and a needle is inserted into the port. °· Only "non-coring" port needles should be used to access the port. Once the port is accessed, a blood return should be checked. This helps ensure that the port  is in the vein and is not clogged. °· If your port needs to remain accessed for a constant infusion, a clear (transparent) bandage will be placed over the needle site. The bandage and needle will need to be changed every week, or as directed by your health care provider. °· Keep the bandage covering the needle clean and dry. Do not get it wet. Follow your health care provider’s instructions on how to take a shower or bath while the port is accessed. °· If your port does not need to stay accessed, no bandage is needed over the port. ° °What is flushing? °Flushing helps keep the port from getting clogged. Follow your health care provider’s instructions on how and when to flush the port. Ports are usually flushed with saline solution or a medicine called heparin. The need for flushing will depend on how the port is used. °· If the port is used for intermittent medicines or blood draws, the port will need to be flushed: °? After medicines have been given. °? After blood has been drawn. °? As part of routine maintenance. °· If a constant infusion is running, the port may not need to be flushed. ° °How long will my port stay implanted? °The port can stay in for as long as your health care provider thinks it is needed. When it is time for the port to come out, surgery will be   done to remove it. The procedure is similar to the one performed when the port was put in. °When should I seek immediate medical care? °When you have an implanted port, you should seek immediate medical care if: °· You notice a bad smell coming from the incision site. °· You have swelling, redness, or drainage at the incision site. °· You have more swelling or pain at the port site or the surrounding area. °· You have a fever that is not controlled with medicine. ° °This information is not intended to replace advice given to you by your health care provider. Make sure you discuss any questions you have with your health care provider. °Document  Released: 11/08/2005 Document Revised: 04/15/2016 Document Reviewed: 07/16/2013 °Elsevier Interactive Patient Education © 2017 [employer] °Moderate Conscious Sedation, Adult, Care After °These instructions provide you with information about caring for yourself after your procedure. Your health care provider may also give you more specific instructions. Your treatment has been planned according to current medical practices, but problems sometimes occur. Call your health care provider if you have any problems or questions after your procedure. °What can I expect after the procedure? °After your procedure, it is common: °· To feel sleepy for several hours. °· To feel clumsy and have poor balance for several hours. °· To have poor judgment for several hours. °· To vomit if you eat too soon. ° °Follow these instructions at home: °For at least 24 hours after the procedure: ° °· Do not: °? Participate in activities where you could fall or become injured. °? Drive. °? Use heavy machinery. °? Drink alcohol. °? Take sleeping pills or medicines that cause drowsiness. °? Make important decisions or sign legal documents. °? Take care of children on your own. °· Rest. °Eating and drinking °· Follow the diet recommended by your health care provider. °· If you vomit: °? Drink water, juice, or soup when you can drink without vomiting. °? Make sure you have little or no nausea before eating solid foods. °General instructions °· Have a responsible adult stay with you until you are awake and alert. °· Take over-the-counter and prescription medicines only as told by your health care provider. °· If you smoke, do not smoke without supervision. °· Keep all follow-up visits as told by your health care provider. This is important. °Contact a health care provider if: °· You keep feeling nauseous or you keep vomiting. °· You feel light-headed. °· You develop a rash. °· You have a fever. °Get help right away if: °· You have trouble  breathing. °This information is not intended to replace advice given to you by your health care provider. Make sure you discuss any questions you have with your health care provider. °Document Released: 08/29/2013 Document Revised: 04/12/2016 Document Reviewed: 02/28/2016 °Elsevier Interactive Patient Education © 2018 [employer] ° °

## 2018-07-14 NOTE — Sedation Documentation (Signed)
Patient snoring 

## 2018-07-14 NOTE — Telephone Encounter (Signed)
Appts scheduled as requested per 8/16 los

## 2018-07-14 NOTE — H&P (Signed)
Chief Complaint: Patient was seen in consultation today for Port-A-Cath placement  Referring Physician(s): [name]  Supervising Physician: Daryll Brod  Patient Status: [place]  History of Present Illness: Stacey Cox is a 62 y.o. female with history of metastatic left breast carcinoma presents today for Port-A-Cath placement for chemotherapy.  Past Medical History:  Diagnosis Date  . Cancer [facility])    Metastatic Breast Cancer    Past Surgical History:  Procedure Laterality Date  . CHOLECYSTECTOMY    . TONSILLECTOMY      Allergies: Thorazine [chlorpromazine] and Ribociclib  Medications: Prior to Admission medications   Medication Sig Start Date End Date Taking? Authorizing Provider  abemaciclib (VERZENIO) 150 MG tablet TAKE 1 TABLET (150 MG TOTAL) BY MOUTH 2 TIMES DAILY. 03/21/18   [name], MD  aspirin EC 81 MG tablet Take 1 tablet (81 mg total) by mouth daily. 08/01/17   [name], MD  dexamethasone (DECADRON) 4 MG tablet Take 1 tablet (4 mg total) by mouth daily with breakfast. 07/07/18   [name], MD  dexamethasone (DECADRON) 4 MG tablet Take 2 tablets by mouth once a day on the day after chemotherapy and then take 2 tablets two times a day for 2 days. Take with food. 07/12/18   [name], MD  escitalopram (LEXAPRO) 20 MG tablet TAKE 1 TABLET(20 MG) BY MOUTH DAILY 06/19/18   [name], MD  HYDROcodone-acetaminophen (NORCO/VICODIN) 5-325 MG tablet Take 1-2 tablets by mouth every 4 (four) hours as needed for moderate pain or severe pain. 08/20/17   Charlesetta Shanks, MD  hydrOXYzine (ATARAX/VISTARIL) 10 MG tablet Take 2.5 tablets (25 mg total) by mouth 2 (two) times daily as needed for nausea or vomiting. 01/19/18   [name], MD  letrozole [facility]) 2.5 MG tablet Take 1 tablet (2.5mg ) by mouth daily. 03/21/18   [name], MD  letrozole [facility]) 2.5 MG tablet TAKE 1 TABLET(2.5 MG) BY  MOUTH DAILY 04/25/18   [name], MD  lidocaine-prilocaine (EMLA) cream Apply to affected area once 07/12/18   [name], MD  loperamide (IMODIUM) 1 MG/5ML solution Take 2 mg by mouth as needed for diarrhea or loose stools.     [provider]  LORazepam (ATIVAN) 0.5 MG tablet Take 1 tablet (0.5 mg total) by mouth every 6 (six) hours as needed. for anxiety 03/13/18   Ardath Sax, MD  ondansetron (ZOFRAN) 4 MG tablet Take 1 tablet (4 mg total) by mouth 2 (two) times daily. Take with Verzenio. 01/24/18   [name], MD  ondansetron (ZOFRAN) 4 MG tablet TAKE 1 TABLET(4 MG) BY MOUTH EVERY 8 HOURS AS NEEDED FOR NAUSEA OR VOMITING 07/06/18   [name], MD  potassium chloride SA (K-DUR,KLOR-CON) 20 MEQ tablet Take 1 tablet (20 mEq total) by mouth 2 (two) times daily. 02/01/18   [name], MD  scopolamine (TRANSDERM-SCOP) 1 MG/3DAYS Place 1 patch (1.5 mg total) onto the skin every 3 (three) days. 07/07/18   [name], MD  Vitamin D, Ergocalciferol, (DRISDOL) 50000 units CAPS capsule TAKE 1 CAPSULE BY MOUTH 3 TIMES WEEKLY 05/07/18   [name], MD     Family History  Problem Relation Age of Onset  . Cancer Mother        breast  . Cancer Sister        breast    Social History   Socioeconomic History  . Marital status: Single  Spouse name: Not on file  . Number of children: Not on file  . Years of education: Not on file  . Highest education level: Not on file  Occupational History  . Not on file  Social Needs  . Financial resource strain: Not on file  . Food insecurity:    Worry: Not on file    Inability: Not on file  . Transportation needs:    Medical: Not on file    Non-medical: Not on file  Tobacco Use  . Smoking status: Former Smoker    Packs/day: 0.00    Types: Cigarettes    Last attempt to quit: 01/21/2017    Years since quitting: 1.4  . Smokeless tobacco: Never Used  Substance and Sexual Activity    . Alcohol use: No    Alcohol/week: 0.0 standard drinks  . Drug use: No  . Sexual activity: Never  Lifestyle  . Physical activity:    Days per week: Not on file    Minutes per session: Not on file  . Stress: Not on file  Relationships  . Social connections:    Talks on phone: Not on file    Gets together: Not on file    Attends religious service: Not on file    Active member of club or organization: Not on file    Attends meetings of clubs or organizations: Not on file    Relationship status: Not on file  Other Topics Concern  . Not on file  Social History Narrative  . Not on file      Review of Systems: currently denies headache, fever, chest pain, dyspnea,or bleeding. She does have occ cough, abd/back pain, and some intermittent N/V  Vital Signs: Vitals:   07/14/18 1320  BP: 103/69  Pulse: 67  Resp: 18  Temp: 98.9 F (37.2 C)  SpO2: 98%      Physical Exam awake, alert.  Chest clear to auscultation bilaterally.  Heart with regular rate and rhythm.  Abdomen soft, positive bowel sounds, mild diffuse tenderness.  No LE edema.  Imaging: Ct Chest W Contrast  Result Date: 07/06/2018 CLINICAL DATA:  Metastatic breast carcinoma. Recent episodes of vomiting. Some weight loss. EXAM: CT CHEST, ABDOMEN, AND PELVIS WITH CONTRAST TECHNIQUE: Multidetector CT imaging of the chest, abdomen and pelvis was performed following the standard protocol during bolus administration of intravenous contrast. CONTRAST:  180mL OMNIPAQUE IOHEXOL 300 MG/ML  SOLN COMPARISON:  CT 02/22/2018 FINDINGS: CT CHEST FINDINGS Cardiovascular: No significant vascular findings. Normal heart size. No pericardial effusion. Mediastinum/Nodes: Axillary supraclavicular adenopathy. No mediastinal hilar adenopathy. No pericardial effusion. Esophagus normal. Lungs/Pleura: No suspicious pulmonary nodules Musculoskeletal: Multiple sclerotic lesions spine and ribs unchanged CT ABDOMEN AND PELVIS FINDINGS Hepatobiliary: There  is new infiltrative pattern of enhancing lesions involving large portion of the central liver (segment 5, segment 8, 4A, and 4 B). This is in a region of a previously enhancing lesion in the RIGHT hepatic lobe which is now difficult define on the background this new infiltrative process. There are several discrete lesions including a 2.5 cm peripheral enhancing lesion in the central RIGHT hepatic lobe (image 47/2). More peripheral subcapsular lesions in the dome of liver with irregular peripheral enhancement measured 2.1 x 0.8 cm on image 42/2 and 1.6 by 2.3 cm on image 38/2. The infiltrative mass like pattern involves broad segment measuring 18 x 8 cm on image 52/2 for example. Very small amount of fluid along the RIGHT hepatic lobe and paraspinal liver no biliary duct  dilatation. Portal veins patent. Postcholecystectomy. Pancreas: Pancreas is normal. No ductal dilatation. No pancreatic inflammation. Spleen: Small low-density lesions spleen is favored benign cysts. Adrenals/urinary tract: Adrenal glands, kidneys, ureters and bladder normal. Stomach/Bowel: Stomach, small bowel, appendix, and cecum are normal. The colon and rectosigmoid colon are normal. Vascular/Lymphatic: Abdominal aorta is normal caliber. There is no retroperitoneal or periportal lymphadenopathy. No pelvic lymphadenopathy. Reproductive: Uterus normal. Other: Small free fluid the pelvis. Musculoskeletal: Multiple densely sclerotic lesions in the pelvis and spine are not changed from comparison exam IMPRESSION: Chest Impression: 1. No evidence pulmonary metastasis or lymphadenopathy. 2. Stable dense sclerotic skeletal metastasis. Abdomen / Pelvis Impression: 1. New pattern of heterogeneous enhancement and enhancing ill-defined lesions in the central LEFT and RIGHT hepatic lobe at site prior enhancing lesion is highly concerning for progression of of infiltrative malignancy in the liver occupying a large portion of the RIGHT hepatic lobe and central  LEFT hepatic lobe. 2. Small amount of free fluid in the abdomen pelvis. 3. No metastatic adenopathy. 4. Stable  skeletal sclerotic metastasis. These results will be called to the ordering clinician or representative by the Radiologist Assistant, and communication documented in the PACS or zVision Dashboard. Electronically Signed   By: Suzy Bouchard M.D.   On: 07/06/2018 14:37   Ct Abdomen Pelvis W Contrast  Result Date: 07/06/2018 CLINICAL DATA:  Metastatic breast carcinoma. Recent episodes of vomiting. Some weight loss. EXAM: CT CHEST, ABDOMEN, AND PELVIS WITH CONTRAST TECHNIQUE: Multidetector CT imaging of the chest, abdomen and pelvis was performed following the standard protocol during bolus administration of intravenous contrast. CONTRAST:  183mL OMNIPAQUE IOHEXOL 300 MG/ML  SOLN COMPARISON:  CT 02/22/2018 FINDINGS: CT CHEST FINDINGS Cardiovascular: No significant vascular findings. Normal heart size. No pericardial effusion. Mediastinum/Nodes: Axillary supraclavicular adenopathy. No mediastinal hilar adenopathy. No pericardial effusion. Esophagus normal. Lungs/Pleura: No suspicious pulmonary nodules Musculoskeletal: Multiple sclerotic lesions spine and ribs unchanged CT ABDOMEN AND PELVIS FINDINGS Hepatobiliary: There is new infiltrative pattern of enhancing lesions involving large portion of the central liver (segment 5, segment 8, 4A, and 4 B). This is in a region of a previously enhancing lesion in the RIGHT hepatic lobe which is now difficult define on the background this new infiltrative process. There are several discrete lesions including a 2.5 cm peripheral enhancing lesion in the central RIGHT hepatic lobe (image 47/2). More peripheral subcapsular lesions in the dome of liver with irregular peripheral enhancement measured 2.1 x 0.8 cm on image 42/2 and 1.6 by 2.3 cm on image 38/2. The infiltrative mass like pattern involves broad segment measuring 18 x 8 cm on image 52/2 for example. Very small  amount of fluid along the RIGHT hepatic lobe and paraspinal liver no biliary duct dilatation. Portal veins patent. Postcholecystectomy. Pancreas: Pancreas is normal. No ductal dilatation. No pancreatic inflammation. Spleen: Small low-density lesions spleen is favored benign cysts. Adrenals/urinary tract: Adrenal glands, kidneys, ureters and bladder normal. Stomach/Bowel: Stomach, small bowel, appendix, and cecum are normal. The colon and rectosigmoid colon are normal. Vascular/Lymphatic: Abdominal aorta is normal caliber. There is no retroperitoneal or periportal lymphadenopathy. No pelvic lymphadenopathy. Reproductive: Uterus normal. Other: Small free fluid the pelvis. Musculoskeletal: Multiple densely sclerotic lesions in the pelvis and spine are not changed from comparison exam IMPRESSION: Chest Impression: 1. No evidence pulmonary metastasis or lymphadenopathy. 2. Stable dense sclerotic skeletal metastasis. Abdomen / Pelvis Impression: 1. New pattern of heterogeneous enhancement and enhancing ill-defined lesions in the central LEFT and RIGHT hepatic lobe at site prior enhancing lesion is  highly concerning for progression of of infiltrative malignancy in the liver occupying a large portion of the RIGHT hepatic lobe and central LEFT hepatic lobe. 2. Small amount of free fluid in the abdomen pelvis. 3. No metastatic adenopathy. 4. Stable  skeletal sclerotic metastasis. These results will be called to the ordering clinician or representative by the Radiologist Assistant, and communication documented in the PACS or zVision Dashboard. Electronically Signed   By: Suzy Bouchard M.D.   On: 07/06/2018 14:37    Labs:  CBC: Recent Labs    04/26/18 0906 05/24/18 0905 06/21/18 0833 07/07/18 1200  WBC 4.5 4.6 4.3 4.5  HGB 13.1 12.8 13.1 12.3  HCT 40.6 39.0 39.3 37.7  PLT 215 163 160 107*    COAGS: Recent Labs    07/14/18 1257  INR 1.45    BMP: Recent Labs    04/26/18 0906 05/24/18 0905  06/21/18 0833 07/07/18 1200  NA 140 138 142 140  K 4.2 3.9 3.9 3.1*  CL 105 104 105 102  CO2 25 26 28 27   GLUCOSE 94 135* 130* 137*  BUN 11 17 8 10   CALCIUM 9.2 9.2 9.6 8.2*  CREATININE 1.32* 1.36* 1.27* 1.41*  GFRNONAA 42* 41* 44* 39*  GFRAA 49* 47* 51* 45*    LIVER FUNCTION TESTS: Recent Labs    04/26/18 0906 05/24/18 0905 06/21/18 0833 07/07/18 1200  BILITOT 0.3 0.3 0.6 2.0*  AST 33 48* 101* 210*  ALT 26 33 49* 89*  ALKPHOS 91 143* 233* 359*  PROT 7.0 6.8 6.9 6.3*  ALBUMIN 3.6 3.4* 3.2* 2.8*    TUMOR MARKERS: No results for input(s): AFPTM, CEA, CA199, CHROMGRNA in the last 8760 hours.  Assessment and Plan: 62 y.o. female with history of metastatic left breast carcinoma presents today for Port-A-Cath placement for chemotherapy.Risks and benefits of image guided port-a-catheter placement was discussed with the patient including, but not limited to bleeding, infection, pneumothorax, or fibrin sheath development and need for additional procedures.  All of the patient's questions were answered, patient is agreeable to proceed. Consent signed and in chart.     Thank you for this interesting consult.  I greatly enjoyed meeting Stacey Cox and look forward to participating in their care.  A copy of this report was sent to the requesting provider on this date.  Electronically Signed: D. Rowe Robert, PA-C 07/14/2018, 1:24 PM   I spent a total of 25 minutes  in face to face in clinical consultation, greater than 50% of which was counseling/coordinating care for port a cath placement

## 2018-07-16 ENCOUNTER — Other Ambulatory Visit: Payer: Self-pay | Admitting: Radiology

## 2018-07-17 ENCOUNTER — Telehealth: Payer: Self-pay | Admitting: Hematology

## 2018-07-17 ENCOUNTER — Inpatient Hospital Stay: Payer: BC Managed Care – PPO | Admitting: Nutrition

## 2018-07-17 ENCOUNTER — Inpatient Hospital Stay: Payer: BC Managed Care – PPO

## 2018-07-17 ENCOUNTER — Inpatient Hospital Stay (HOSPITAL_BASED_OUTPATIENT_CLINIC_OR_DEPARTMENT_OTHER): Payer: BC Managed Care – PPO | Admitting: Hematology

## 2018-07-17 ENCOUNTER — Ambulatory Visit: Payer: BC Managed Care – PPO

## 2018-07-17 VITALS — BP 104/73 | HR 90 | Temp 98.9°F | Resp 18 | Ht 65.0 in | Wt 163.6 lb

## 2018-07-17 DIAGNOSIS — C787 Secondary malignant neoplasm of liver and intrahepatic bile duct: Secondary | ICD-10-CM | POA: Diagnosis not present

## 2018-07-17 DIAGNOSIS — C7951 Secondary malignant neoplasm of bone: Secondary | ICD-10-CM | POA: Diagnosis not present

## 2018-07-17 DIAGNOSIS — C7800 Secondary malignant neoplasm of unspecified lung: Secondary | ICD-10-CM | POA: Diagnosis not present

## 2018-07-17 DIAGNOSIS — Z5111 Encounter for antineoplastic chemotherapy: Secondary | ICD-10-CM | POA: Diagnosis not present

## 2018-07-17 DIAGNOSIS — C50812 Malignant neoplasm of overlapping sites of left female breast: Secondary | ICD-10-CM | POA: Diagnosis not present

## 2018-07-17 DIAGNOSIS — Z170 Estrogen receptor positive status [ER+]: Secondary | ICD-10-CM

## 2018-07-17 DIAGNOSIS — Z7189 Other specified counseling: Secondary | ICD-10-CM

## 2018-07-17 DIAGNOSIS — R197 Diarrhea, unspecified: Secondary | ICD-10-CM

## 2018-07-17 DIAGNOSIS — Z95828 Presence of other vascular implants and grafts: Secondary | ICD-10-CM | POA: Insufficient documentation

## 2018-07-17 DIAGNOSIS — E559 Vitamin D deficiency, unspecified: Secondary | ICD-10-CM

## 2018-07-17 DIAGNOSIS — G893 Neoplasm related pain (acute) (chronic): Secondary | ICD-10-CM

## 2018-07-17 DIAGNOSIS — C50919 Malignant neoplasm of unspecified site of unspecified female breast: Secondary | ICD-10-CM

## 2018-07-17 LAB — CBC WITH DIFFERENTIAL/PLATELET
BASOS ABS: 0 10*3/uL (ref 0.0–0.1)
Basophils Relative: 0 %
EOS ABS: 0 10*3/uL (ref 0.0–0.5)
Eosinophils Relative: 0 %
HCT: 32.3 % — ABNORMAL LOW (ref 34.8–46.6)
Hemoglobin: 10.9 g/dL — ABNORMAL LOW (ref 11.6–15.9)
Lymphocytes Relative: 11 %
Lymphs Abs: 0.7 10*3/uL — ABNORMAL LOW (ref 0.9–3.3)
MCH: 33.1 pg (ref 25.1–34.0)
MCHC: 33.7 g/dL (ref 31.5–36.0)
MCV: 98.2 fL (ref 79.5–101.0)
Monocytes Absolute: 0.7 10*3/uL (ref 0.1–0.9)
Monocytes Relative: 10 %
Neutro Abs: 5.2 10*3/uL (ref 1.5–6.5)
Neutrophils Relative %: 79 %
PLATELETS: 81 10*3/uL — AB (ref 145–400)
RBC: 3.29 MIL/uL — AB (ref 3.70–5.45)
RDW: 19.1 % — AB (ref 11.2–14.5)
WBC: 6.6 10*3/uL (ref 3.9–10.3)
nRBC: 2 /100 WBC — ABNORMAL HIGH

## 2018-07-17 LAB — CMP (CANCER CENTER ONLY)
ALT: 130 U/L — ABNORMAL HIGH (ref 0–44)
ANION GAP: 11 (ref 5–15)
AST: 298 U/L — AB (ref 15–41)
Albumin: 2.3 g/dL — ABNORMAL LOW (ref 3.5–5.0)
Alkaline Phosphatase: 421 U/L — ABNORMAL HIGH (ref 38–126)
BUN: 11 mg/dL (ref 8–23)
CHLORIDE: 101 mmol/L (ref 98–111)
CO2: 25 mmol/L (ref 22–32)
Calcium: 8.1 mg/dL — ABNORMAL LOW (ref 8.9–10.3)
Creatinine: 0.82 mg/dL (ref 0.44–1.00)
GFR, Est AFR Am: >60 mL/min (ref >60)
GFR, Estimated: >60 mL/min (ref >60)
Glucose, Bld: 203 mg/dL — ABNORMAL HIGH (ref 70–99)
POTASSIUM: 3 mmol/L — AB (ref 3.5–5.1)
Sodium: 137 mmol/L (ref 135–145)
Total Bilirubin: 3.2 mg/dL — ABNORMAL HIGH (ref 0.3–1.2)
Total Protein: 5.5 g/dL — ABNORMAL LOW (ref 6.5–8.1)

## 2018-07-17 MED ORDER — HEPARIN SOD (PORK) LOCK FLUSH 100 UNIT/ML IV SOLN
500.0000 [IU] | Freq: Once | INTRAVENOUS | Status: AC
  Administered 2018-07-17: 500 [IU]
  Filled 2018-07-17: qty 5

## 2018-07-17 MED ORDER — POTASSIUM CHLORIDE CRYS ER 20 MEQ PO TBCR: 20.0000 meq | EXTENDED_RELEASE_TABLET | Freq: Two times a day (BID) | ORAL | 0 refills | Status: DC

## 2018-07-17 MED ORDER — SODIUM CHLORIDE 0.9% FLUSH
10.0000 mL | Freq: Once | INTRAVENOUS | Status: AC
  Administered 2018-07-17: 10 mL
  Filled 2018-07-17: qty 10

## 2018-07-17 NOTE — Telephone Encounter (Signed)
Appts scheduled AVS/Calendar printed per 8/26 los °

## 2018-07-18 ENCOUNTER — Other Ambulatory Visit: Payer: Self-pay | Admitting: Hematology

## 2018-07-18 ENCOUNTER — Ambulatory Visit (HOSPITAL_COMMUNITY)
Admission: RE | Admit: 2018-07-18 | Discharge: 2018-07-18 | Disposition: A | Discharge status: Home / Self Care | Payer: BC Managed Care – PPO | Source: Ambulatory Visit | Attending: Hematology | Admitting: Hematology

## 2018-07-18 ENCOUNTER — Other Ambulatory Visit (HOSPITAL_COMMUNITY): Payer: BC Managed Care – PPO

## 2018-07-18 ENCOUNTER — Other Ambulatory Visit: Payer: Self-pay

## 2018-07-18 ENCOUNTER — Ambulatory Visit (HOSPITAL_COMMUNITY): Payer: BC Managed Care – PPO

## 2018-07-18 ENCOUNTER — Encounter (HOSPITAL_COMMUNITY): Payer: Self-pay

## 2018-07-18 DIAGNOSIS — Z888 Allergy status to other drugs, medicaments and biological substances status: Secondary | ICD-10-CM | POA: Diagnosis not present

## 2018-07-18 DIAGNOSIS — C50912 Malignant neoplasm of unspecified site of left female breast: Secondary | ICD-10-CM | POA: Diagnosis not present

## 2018-07-18 DIAGNOSIS — Z7982 Long term (current) use of aspirin: Secondary | ICD-10-CM | POA: Insufficient documentation

## 2018-07-18 DIAGNOSIS — R188 Other ascites: Secondary | ICD-10-CM | POA: Diagnosis not present

## 2018-07-18 DIAGNOSIS — Z803 Family history of malignant neoplasm of breast: Secondary | ICD-10-CM | POA: Insufficient documentation

## 2018-07-18 DIAGNOSIS — C787 Secondary malignant neoplasm of liver and intrahepatic bile duct: Secondary | ICD-10-CM | POA: Insufficient documentation

## 2018-07-18 DIAGNOSIS — Z87891 Personal history of nicotine dependence: Secondary | ICD-10-CM | POA: Diagnosis not present

## 2018-07-18 DIAGNOSIS — Z9049 Acquired absence of other specified parts of digestive tract: Secondary | ICD-10-CM | POA: Insufficient documentation

## 2018-07-18 DIAGNOSIS — Z9889 Other specified postprocedural states: Secondary | ICD-10-CM | POA: Insufficient documentation

## 2018-07-18 DIAGNOSIS — Z79891 Long term (current) use of opiate analgesic: Secondary | ICD-10-CM | POA: Diagnosis not present

## 2018-07-18 DIAGNOSIS — C50919 Malignant neoplasm of unspecified site of unspecified female breast: Secondary | ICD-10-CM

## 2018-07-18 DIAGNOSIS — Z79899 Other long term (current) drug therapy: Secondary | ICD-10-CM | POA: Insufficient documentation

## 2018-07-18 LAB — CANCER ANTIGEN 15-3: CA 15-3: 744 U/mL — ABNORMAL HIGH (ref 0.0–25.0)

## 2018-07-18 LAB — COMPREHENSIVE METABOLIC PANEL
ALBUMIN: 2.4 g/dL — AB (ref 3.5–5.0)
ALT: 135 U/L — ABNORMAL HIGH (ref 0–44)
ANION GAP: 14 (ref 5–15)
AST: 340 U/L — AB (ref 15–41)
Alkaline Phosphatase: 387 U/L — ABNORMAL HIGH (ref 38–126)
BUN: 12 mg/dL (ref 8–23)
CHLORIDE: 104 mmol/L (ref 98–111)
CO2: 21 mmol/L — AB (ref 22–32)
Calcium: 8.2 mg/dL — ABNORMAL LOW (ref 8.9–10.3)
Creatinine, Ser: 0.76 mg/dL (ref 0.44–1.00)
GFR calc Af Amer: >60 mL/min (ref >60)
GFR calc non Af Amer: >60 mL/min (ref >60)
GLUCOSE: 154 mg/dL — AB (ref 70–99)
POTASSIUM: 3.2 mmol/L — AB (ref 3.5–5.1)
SODIUM: 139 mmol/L (ref 135–145)
Total Bilirubin: 3.8 mg/dL — ABNORMAL HIGH (ref 0.3–1.2)
Total Protein: 5.8 g/dL — ABNORMAL LOW (ref 6.5–8.1)

## 2018-07-18 LAB — CBC WITH DIFFERENTIAL/PLATELET
BASOS ABS: 0 10*3/uL (ref 0.0–0.1)
BASOS PCT: 0 %
EOS ABS: 0 10*3/uL (ref 0.0–0.7)
Eosinophils Relative: 0 %
HCT: 33.8 % — ABNORMAL LOW (ref 36.0–46.0)
HEMOGLOBIN: 11.4 g/dL — AB (ref 12.0–15.0)
Lymphocytes Relative: 10 %
Lymphs Abs: 0.9 10*3/uL (ref 0.7–4.0)
MCH: 32.9 pg (ref 26.0–34.0)
MCHC: 33.7 g/dL (ref 30.0–36.0)
MCV: 97.7 fL (ref 78.0–100.0)
MONO ABS: 0.8 10*3/uL (ref 0.1–1.0)
Monocytes Relative: 10 %
NEUTROS ABS: 6.9 10*3/uL (ref 1.7–7.7)
NEUTROS PCT: 80 %
Platelets: 108 10*3/uL — ABNORMAL LOW (ref 150–400)
RBC: 3.46 MIL/uL — ABNORMAL LOW (ref 3.87–5.11)
RDW: 19.1 % — AB (ref 11.5–15.5)
WBC: 8.6 10*3/uL (ref 4.0–10.5)

## 2018-07-18 LAB — PROTIME-INR
INR: 1.56
Prothrombin Time: 18.5 seconds — ABNORMAL HIGH (ref 11.4–15.2)

## 2018-07-18 LAB — CANCER ANTIGEN 27.29: CA 27.29: 758.8 U/mL — ABNORMAL HIGH (ref 0.0–38.6)

## 2018-07-18 MED ORDER — LIDOCAINE HCL 1 % IJ SOLN
INTRAMUSCULAR | Status: AC
  Filled 2018-07-18: qty 20

## 2018-07-18 MED ORDER — FENTANYL CITRATE (PF) 100 MCG/2ML IJ SOLN
INTRAMUSCULAR | Status: AC | PRN
  Administered 2018-07-18 (×2): 50 ug via INTRAVENOUS

## 2018-07-18 MED ORDER — SODIUM CHLORIDE 0.9 % IV SOLN
INTRAVENOUS | Status: DC
  Administered 2018-07-18: 09:00:00 via INTRAVENOUS

## 2018-07-18 MED ORDER — MIDAZOLAM HCL 2 MG/2ML IJ SOLN
INTRAMUSCULAR | Status: AC
  Filled 2018-07-18: qty 4

## 2018-07-18 MED ORDER — FENTANYL CITRATE (PF) 100 MCG/2ML IJ SOLN
INTRAMUSCULAR | Status: AC
  Filled 2018-07-18: qty 2

## 2018-07-18 MED ORDER — HYDROCODONE-ACETAMINOPHEN 5-325 MG PO TABS: 1.0000 | ORAL_TABLET | ORAL | Status: DC | PRN

## 2018-07-18 MED ORDER — MIDAZOLAM HCL 2 MG/2ML IJ SOLN
INTRAMUSCULAR | Status: AC | PRN
  Administered 2018-07-18 (×2): 1 mg via INTRAVENOUS

## 2018-07-18 MED ORDER — LIDOCAINE HCL (PF) 1 % IJ SOLN
INTRAMUSCULAR | Status: AC | PRN
  Administered 2018-07-18: 20 mL

## 2018-07-18 NOTE — Procedures (Signed)
  Procedure: [place] core bx liver lesion  18g x3 EBL:   minimal Complications:  none immediate  See full dictation in [facility].  Dillard Cannon MD Main # [phone] Pager  [phone]

## 2018-07-18 NOTE — H&P (Signed)
Chief Complaint: Metastatic left breast cancer  Referring Physician(s): [name]  Supervising Physician: Arne Cleveland  Patient Status: [facility] - Out-pt  History of Present Illness: Stacey Cox is a 62 y.o. female who is known to our service.  She has metastatic breast cancer and had placement of a Port A Cath on 07/14/2018 by Dr. Annamaria Boots.  CT scan done on 07/06/18 showed: New pattern of heterogeneous enhancement and enhancing ill-defined lesions in the central LEFT and RIGHT hepatic lobe at site prior enhancing lesion is highly concerning for progression of of infiltrative malignancy in the liver occupying a large portion of the RIGHT hepatic lobe and central LEFT hepatic lobe.  She is here today for a biopsy of a liver lesion.   She is NPO. No blood thinners. She feels ok today. No fever/chills, no N/V. ROS negative.  Past Medical History:  Diagnosis Date  . Cancer [facility])    Metastatic Breast Cancer    Past Surgical History:  Procedure Laterality Date  . CHOLECYSTECTOMY    . IR IMAGING GUIDED PORT INSERTION  07/14/2018  . TONSILLECTOMY      Allergies: Thorazine [chlorpromazine] and Ribociclib  Medications: Prior to Admission medications   Medication Sig Start Date End Date Taking? Authorizing Provider  abemaciclib (VERZENIO) 150 MG tablet TAKE 1 TABLET (150 MG TOTAL) BY MOUTH 2 TIMES DAILY. 03/21/18   [name], MD  aspirin EC 81 MG tablet Take 1 tablet (81 mg total) by mouth daily. 08/01/17   [name], MD  dexamethasone (DECADRON) 4 MG tablet Take 1 tablet (4 mg total) by mouth daily with breakfast. 07/07/18   [name], MD  dexamethasone (DECADRON) 4 MG tablet Take 2 tablets by mouth once a day on the day after chemotherapy and then take 2 tablets two times a day for 2 days. Take with food. 07/12/18   [name], MD  escitalopram (LEXAPRO) 20 MG tablet TAKE 1 TABLET(20 MG) BY MOUTH DAILY 06/19/18   [name], MD  HYDROcodone-acetaminophen (NORCO/VICODIN) 5-325 MG tablet Take 1-2 tablets by mouth every 4 (four) hours as needed for moderate pain or severe pain. 08/20/17   Charlesetta Shanks, MD  hydrOXYzine (ATARAX/VISTARIL) 10 MG tablet Take 2.5 tablets (25 mg total) by mouth 2 (two) times daily as needed for nausea or vomiting. 01/19/18   [name], MD  letrozole [place]) 2.5 MG tablet Take 1 tablet (2.5mg ) by mouth daily. 03/21/18   [name], MD  letrozole [facility]) 2.5 MG tablet TAKE 1 TABLET(2.5 MG) BY MOUTH DAILY 04/25/18   [name], MD  lidocaine-prilocaine (EMLA) cream Apply to affected area once 07/12/18   [name], MD  loperamide (IMODIUM) 1 MG/5ML solution Take 2 mg by mouth as needed for diarrhea or loose stools.     [provider]  LORazepam (ATIVAN) 0.5 MG tablet Take 1 tablet (0.5 mg total) by mouth every 6 (six) hours as needed. for anxiety 03/13/18   Ardath Sax, MD  ondansetron (ZOFRAN) 4 MG tablet Take 1 tablet (4 mg total) by mouth 2 (two) times daily. Take with Verzenio. 01/24/18   [name], MD  ondansetron (ZOFRAN) 4 MG tablet TAKE 1 TABLET(4 MG) BY MOUTH EVERY 8 HOURS AS NEEDED FOR NAUSEA OR VOMITING 07/06/18   [name], MD  potassium chloride SA (K-DUR,KLOR-CON) 20 MEQ tablet Take 1 tablet (20 mEq total) by mouth 2 (two) times daily. 07/17/18   Irene Limbo,  Cloria Spring, MD  scopolamine (TRANSDERM-SCOP) 1 MG/3DAYS Place 1 patch (1.5 mg total) onto the skin every 3 (three) days. 07/07/18   [name], MD  Vitamin D, Ergocalciferol, (DRISDOL) 50000 units CAPS capsule TAKE 1 CAPSULE BY MOUTH 3 TIMES WEEKLY 05/07/18   [name], MD     Family History  Problem Relation Age of Onset  . Cancer Mother        breast  . Cancer Sister        breast    Social History   Socioeconomic History  . Marital status: Single    Spouse name: Not on file  . Number of children: Not on file  . Years of  education: Not on file  . Highest education level: Not on file  Occupational History  . Not on file  Social Needs  . Financial resource strain: Not on file  . Food insecurity:    Worry: Not on file    Inability: Not on file  . Transportation needs:    Medical: Not on file    Non-medical: Not on file  Tobacco Use  . Smoking status: Former Smoker    Packs/day: 0.00    Types: Cigarettes    Last attempt to quit: 01/21/2017    Years since quitting: 1.4  . Smokeless tobacco: Never Used  Substance and Sexual Activity  . Alcohol use: No    Alcohol/week: 0.0 standard drinks  . Drug use: No  . Sexual activity: Never  Lifestyle  . Physical activity:    Days per week: Not on file    Minutes per session: Not on file  . Stress: Not on file  Relationships  . Social connections:    Talks on phone: Not on file    Gets together: Not on file    Attends religious service: Not on file    Active member of club or organization: Not on file    Attends meetings of clubs or organizations: Not on file    Relationship status: Not on file  Other Topics Concern  . Not on file  Social History Narrative  . Not on file     Review of Systems: A 12 point ROS discussed and pertinent positives are indicated in the HPI above.  All other systems are negative.   Physical Exam  Constitutional: She is oriented to person, place, and time. She appears well-developed.  HENT:  Head: Normocephalic and atraumatic.  Eyes: EOM are normal.  Neck: Normal range of motion.  Cardiovascular: Normal rate, regular rhythm and normal heart sounds.  Pulmonary/Chest: Breath sounds normal. No respiratory distress.  Abdominal: She exhibits distension.  Neurological: She is alert and oriented to person, place, and time.  Skin: Skin is warm and dry.  Psychiatric: She has a normal mood and affect. Her behavior is normal. Judgment and thought content normal.    Imaging: Ct Chest W Contrast  Result Date:  07/06/2018 CLINICAL DATA:  Metastatic breast carcinoma. Recent episodes of vomiting. Some weight loss. EXAM: CT CHEST, ABDOMEN, AND PELVIS WITH CONTRAST TECHNIQUE: Multidetector CT imaging of the chest, abdomen and pelvis was performed following the standard protocol during bolus administration of intravenous contrast. CONTRAST:  162mL OMNIPAQUE IOHEXOL 300 MG/ML  SOLN COMPARISON:  CT 02/22/2018 FINDINGS: CT CHEST FINDINGS Cardiovascular: No significant vascular findings. Normal heart size. No pericardial effusion. Mediastinum/Nodes: Axillary supraclavicular adenopathy. No mediastinal hilar adenopathy. No pericardial effusion. Esophagus normal. Lungs/Pleura: No suspicious pulmonary nodules Musculoskeletal: Multiple sclerotic lesions spine and ribs  unchanged CT ABDOMEN AND PELVIS FINDINGS Hepatobiliary: There is new infiltrative pattern of enhancing lesions involving large portion of the central liver (segment 5, segment 8, 4A, and 4 B). This is in a region of a previously enhancing lesion in the RIGHT hepatic lobe which is now difficult define on the background this new infiltrative process. There are several discrete lesions including a 2.5 cm peripheral enhancing lesion in the central RIGHT hepatic lobe (image 47/2). More peripheral subcapsular lesions in the dome of liver with irregular peripheral enhancement measured 2.1 x 0.8 cm on image 42/2 and 1.6 by 2.3 cm on image 38/2. The infiltrative mass like pattern involves broad segment measuring 18 x 8 cm on image 52/2 for example. Very small amount of fluid along the RIGHT hepatic lobe and paraspinal liver no biliary duct dilatation. Portal veins patent. Postcholecystectomy. Pancreas: Pancreas is normal. No ductal dilatation. No pancreatic inflammation. Spleen: Small low-density lesions spleen is favored benign cysts. Adrenals/urinary tract: Adrenal glands, kidneys, ureters and bladder normal. Stomach/Bowel: Stomach, small bowel, appendix, and cecum are normal.  The colon and rectosigmoid colon are normal. Vascular/Lymphatic: Abdominal aorta is normal caliber. There is no retroperitoneal or periportal lymphadenopathy. No pelvic lymphadenopathy. Reproductive: Uterus normal. Other: Small free fluid the pelvis. Musculoskeletal: Multiple densely sclerotic lesions in the pelvis and spine are not changed from comparison exam IMPRESSION: Chest Impression: 1. No evidence pulmonary metastasis or lymphadenopathy. 2. Stable dense sclerotic skeletal metastasis. Abdomen / Pelvis Impression: 1. New pattern of heterogeneous enhancement and enhancing ill-defined lesions in the central LEFT and RIGHT hepatic lobe at site prior enhancing lesion is highly concerning for progression of of infiltrative malignancy in the liver occupying a large portion of the RIGHT hepatic lobe and central LEFT hepatic lobe. 2. Small amount of free fluid in the abdomen pelvis. 3. No metastatic adenopathy. 4. Stable  skeletal sclerotic metastasis. These results will be called to the ordering clinician or representative by the Radiologist Assistant, and communication documented in the PACS or zVision Dashboard. Electronically Signed   By: Suzy Bouchard M.D.   On: 07/06/2018 14:37   Ct Abdomen Pelvis W Contrast  Result Date: 07/06/2018 CLINICAL DATA:  Metastatic breast carcinoma. Recent episodes of vomiting. Some weight loss. EXAM: CT CHEST, ABDOMEN, AND PELVIS WITH CONTRAST TECHNIQUE: Multidetector CT imaging of the chest, abdomen and pelvis was performed following the standard protocol during bolus administration of intravenous contrast. CONTRAST:  142mL OMNIPAQUE IOHEXOL 300 MG/ML  SOLN COMPARISON:  CT 02/22/2018 FINDINGS: CT CHEST FINDINGS Cardiovascular: No significant vascular findings. Normal heart size. No pericardial effusion. Mediastinum/Nodes: Axillary supraclavicular adenopathy. No mediastinal hilar adenopathy. No pericardial effusion. Esophagus normal. Lungs/Pleura: No suspicious pulmonary  nodules Musculoskeletal: Multiple sclerotic lesions spine and ribs unchanged CT ABDOMEN AND PELVIS FINDINGS Hepatobiliary: There is new infiltrative pattern of enhancing lesions involving large portion of the central liver (segment 5, segment 8, 4A, and 4 B). This is in a region of a previously enhancing lesion in the RIGHT hepatic lobe which is now difficult define on the background this new infiltrative process. There are several discrete lesions including a 2.5 cm peripheral enhancing lesion in the central RIGHT hepatic lobe (image 47/2). More peripheral subcapsular lesions in the dome of liver with irregular peripheral enhancement measured 2.1 x 0.8 cm on image 42/2 and 1.6 by 2.3 cm on image 38/2. The infiltrative mass like pattern involves broad segment measuring 18 x 8 cm on image 52/2 for example. Very small amount of fluid along the RIGHT hepatic lobe  and paraspinal liver no biliary duct dilatation. Portal veins patent. Postcholecystectomy. Pancreas: Pancreas is normal. No ductal dilatation. No pancreatic inflammation. Spleen: Small low-density lesions spleen is favored benign cysts. Adrenals/urinary tract: Adrenal glands, kidneys, ureters and bladder normal. Stomach/Bowel: Stomach, small bowel, appendix, and cecum are normal. The colon and rectosigmoid colon are normal. Vascular/Lymphatic: Abdominal aorta is normal caliber. There is no retroperitoneal or periportal lymphadenopathy. No pelvic lymphadenopathy. Reproductive: Uterus normal. Other: Small free fluid the pelvis. Musculoskeletal: Multiple densely sclerotic lesions in the pelvis and spine are not changed from comparison exam IMPRESSION: Chest Impression: 1. No evidence pulmonary metastasis or lymphadenopathy. 2. Stable dense sclerotic skeletal metastasis. Abdomen / Pelvis Impression: 1. New pattern of heterogeneous enhancement and enhancing ill-defined lesions in the central LEFT and RIGHT hepatic lobe at site prior enhancing lesion is highly  concerning for progression of of infiltrative malignancy in the liver occupying a large portion of the RIGHT hepatic lobe and central LEFT hepatic lobe. 2. Small amount of free fluid in the abdomen pelvis. 3. No metastatic adenopathy. 4. Stable  skeletal sclerotic metastasis. These results will be called to the ordering clinician or representative by the Radiologist Assistant, and communication documented in the PACS or zVision Dashboard. Electronically Signed   By: Suzy Bouchard M.D.   On: 07/06/2018 14:37   Ir Imaging Guided Port Insertion  Result Date: 07/14/2018 CLINICAL DATA:  Metastatic breast cancer EXAM: RIGHT INTERNAL JUGULAR SINGLE LUMEN POWER PORT CATHETER INSERTION Date:  07/14/2018 07/14/2018 4:04 pm Radiologist:  Jerilynn Mages. Daryll Brod, MD Guidance:  Ultrasound fluoroscopic MEDICATIONS: Ancef 2 g; The antibiotic was administered within an appropriate time interval prior to skin puncture. ANESTHESIA/SEDATION: Versed 3.0 mg IV; Fentanyl 100 mcg IV; Moderate Sedation Time:  23 minutes The patient was continuously monitored during the procedure by the interventional radiology nurse under my direct supervision. FLUOROSCOPY TIME:  0 minutes, 36 seconds (5 mGy) COMPLICATIONS: None immediate. CONTRAST:  None. PROCEDURE: Informed consent was obtained from the patient following explanation of the procedure, risks, benefits and alternatives. The patient understands, agrees and consents for the procedure. All questions were addressed. A time out was performed. Maximal barrier sterile technique utilized including caps, mask, sterile gowns, sterile gloves, large sterile drape, hand hygiene, and 2% chlorhexidine scrub. Under sterile conditions and local anesthesia, right internal jugular micropuncture venous access was performed. Access was performed with ultrasound. Images were obtained for documentation of the patent right internal jugular vein. A guide wire was inserted followed by a transitional dilator. This  allowed insertion of a guide wire and catheter into the IVC. Measurements were obtained from the SVC / RA junction back to the right IJ venotomy site. In the right infraclavicular chest, a subcutaneous pocket was created over the second anterior rib. This was done under sterile conditions and local anesthesia. 1% lidocaine with epinephrine was utilized for this. A 2.5 cm incision was made in the skin. Blunt dissection was performed to create a subcutaneous pocket over the right pectoralis major muscle. The pocket was flushed with saline vigorously. There was adequate hemostasis. The port catheter was assembled and checked for leakage. The port catheter was secured in the pocket with two retention sutures. The tubing was tunneled subcutaneously to the right venotomy site and inserted into the SVC/RA junction through a valved peel-away sheath. Position was confirmed with fluoroscopy. Images were obtained for documentation. The patient tolerated the procedure well. No immediate complications. Incisions were closed in a two layer fashion with 4 - 0 Vicryl suture. Dermabond  was applied to the skin. The port catheter was accessed, blood was aspirated followed by saline and heparin flushes. Needle was removed. A dry sterile dressing was applied. IMPRESSION: Ultrasound and fluoroscopically guided right internal jugular single lumen power port catheter insertion. Tip in the SVC/RA junction. Catheter ready for use. Electronically Signed   By: Jerilynn Mages.  Shick M.D.   On: 07/14/2018 16:24    Labs:  CBC: Recent Labs    06/21/18 0833 07/07/18 1200 07/14/18 1257 07/17/18 1146  WBC 4.3 4.5 6.8 6.6  HGB 13.1 12.3 11.9* 10.9*  HCT 39.3 37.7 35.1* 32.3*  PLT 160 107* 105* 81*    COAGS: Recent Labs    07/14/18 1257  INR 1.45    BMP: Recent Labs    06/21/18 0833 07/07/18 1200 07/14/18 1257 07/17/18 1146  NA 142 140 139 137  K 3.9 3.1* 3.2* 3.0*  CL 105 102 105 101  CO2 28 27 24 25   GLUCOSE 130* 137* 111*  203*  BUN 8 10 14 11   CALCIUM 9.6 8.2* 8.5* 8.1*  CREATININE 1.27* 1.41* 0.90 0.82  GFRNONAA 44* 39* >60 >60  GFRAA 51* 45* >60 >60    LIVER FUNCTION TESTS: Recent Labs    05/24/18 0905 06/21/18 0833 07/07/18 1200 07/17/18 1146  BILITOT 0.3 0.6 2.0* 3.2*  AST 48* 101* 210* 298*  ALT 33 49* 89* 130*  ALKPHOS 143* 233* 359* 421*  PROT 6.8 6.9 6.3* 5.5*  ALBUMIN 3.4* 3.2* 2.8* 2.3*    TUMOR MARKERS: No results for input(s): AFPTM, CEA, CA199, CHROMGRNA in the last 8760 hours.  Assessment and Plan:  Metastatic breast cancer with liver lesions.  There was no ascites seen on limited US of the abdomen.  Will proceed with image guided biopsy of a liver lesion by Dr. Vernard Gambles.  Risks and benefits discussed with the patient including, but not limited to bleeding, infection, damage to adjacent structures or low yield requiring additional tests.  All of the patient's questions were answered, patient is agreeable to proceed. Consent signed and in chart.  Thank you for this interesting consult.  I greatly enjoyed meeting Stacey Cox and look forward to participating in their care.  A copy of this report was sent to the requesting provider on this date.  Electronically Signed: Murrell Redden, PA-C   07/18/2018, 9:16 AM      I spent a total of  25 Minutes in face to face in clinical consultation, greater than 50% of which was counseling/coordinating care for paracentesis and liver biopsy.

## 2018-07-18 NOTE — Progress Notes (Signed)
FMLA successfully faxed to Gretel Acre at [facility] Dept, at [phone]. Mailed copy to patient address on file.

## 2018-07-18 NOTE — Discharge Instructions (Signed)
Moderate Conscious Sedation, Adult, Care After These instructions provide you with information about caring for yourself after your procedure. Your health care provider may also give you more specific instructions. Your treatment has been planned according to current medical practices, but problems sometimes occur. Call your health care provider if you have any problems or questions after your procedure. What can I expect after the procedure? After your procedure, it is common:  To feel sleepy for several hours.  To feel clumsy and have poor balance for several hours.  To have poor judgment for several hours.  To vomit if you eat too soon.  Follow these instructions at home: For at least 24 hours after the procedure:   Do not: ? Participate in activities where you could fall or become injured. ? Drive. ? Use heavy machinery. ? Drink alcohol. ? Take sleeping pills or medicines that cause drowsiness. ? Make important decisions or sign legal documents. ? Take care of children on your own.  Rest. Eating and drinking  Follow the diet recommended by your health care provider.  If you vomit: ? Drink water, juice, or soup when you can drink without vomiting. ? Make sure you have little or no nausea before eating solid foods. General instructions  Have a responsible adult stay with you until you are awake and alert.  Take over-the-counter and prescription medicines only as told by your health care provider.  If you smoke, do not smoke without supervision.  Keep all follow-up visits as told by your health care provider. This is important. Contact a health care provider if:  You keep feeling nauseous or you keep vomiting.  You feel light-headed.  You develop a rash.  You have a fever. Get help right away if:  You have trouble breathing. This information is not intended to replace advice given to you by your health care provider. Make sure you discuss any questions you have  with your health care provider. Document Released: 08/29/2013 Document Revised: 04/12/2016 Document Reviewed: 02/28/2016 Elsevier Interactive Patient Education  2018 [employer].   Liver Biopsy, Care After These instructions give you information on caring for yourself after your procedure. Your doctor may also give you more specific instructions. Call your doctor if you have any problems or questions after your procedure. Follow these instructions at home:  Rest at home for 1-2 days or as told by your doctor.  Have someone stay with you for at least 24 hours.  Do not do these things in the first 24 hours: ? Drive. ? Use machinery. ? Take care of other people. ? Sign legal documents. ? Take a bath or shower.  You may shower tomorrow.  There are many different ways to close and cover a cut (incision). For example, a cut can be closed with stitches, skin glue, or adhesive strips. Follow your doctor's instructions on: ? Taking care of your cut. ? Changing and removing your bandage (dressing).  You may remove your dressing tomorrow. ? Removing whatever was used to close your cut.  Do not drink alcohol in the first week.  Do not lift more than 5 pounds or play contact sports for the first 2 weeks.  Take medicines only as told by your doctor. For 1 week, do not take medicine that has aspirin in it or medicines like ibuprofen.  Get your test results. Contact a doctor if:  A cut bleeds and leaves more than just a small spot of blood.  A cut is red, puffs  up (swells), or hurts more than before.  Fluid or something else comes from a cut.  A cut smells bad.  You have a fever or chills. Get help right away if:  You have swelling, bloating, or pain in your belly (abdomen).  You get dizzy or faint.  You have a rash.  You feel sick to your stomach (nauseous) or throw up (vomit).  You have trouble breathing, feel short of breath, or feel faint.  Your chest hurts.  You have  problems talking or seeing.  You have trouble balancing or moving your arms or legs. This information is not intended to replace advice given to you by your health care provider. Make sure you discuss any questions you have with your health care provider. Document Released: 08/17/2008 Document Revised: 04/15/2016 Document Reviewed: 01/04/2014 Elsevier Interactive Patient Education  [employer].

## 2018-07-19 ENCOUNTER — Inpatient Hospital Stay: Payer: BC Managed Care – PPO

## 2018-07-19 ENCOUNTER — Ambulatory Visit: Payer: BC Managed Care – PPO

## 2018-07-19 VITALS — BP 100/62 | HR 86 | Temp 98.9°F | Resp 18

## 2018-07-19 DIAGNOSIS — C787 Secondary malignant neoplasm of liver and intrahepatic bile duct: Secondary | ICD-10-CM

## 2018-07-19 DIAGNOSIS — Z5111 Encounter for antineoplastic chemotherapy: Secondary | ICD-10-CM | POA: Diagnosis not present

## 2018-07-19 DIAGNOSIS — Z95828 Presence of other vascular implants and grafts: Secondary | ICD-10-CM

## 2018-07-19 DIAGNOSIS — C50919 Malignant neoplasm of unspecified site of unspecified female breast: Secondary | ICD-10-CM

## 2018-07-19 DIAGNOSIS — Z7189 Other specified counseling: Secondary | ICD-10-CM

## 2018-07-19 DIAGNOSIS — C7951 Secondary malignant neoplasm of bone: Secondary | ICD-10-CM

## 2018-07-19 MED ORDER — PALONOSETRON HCL INJECTION 0.25 MG/5ML
0.2500 mg | Freq: Once | INTRAVENOUS | Status: AC
  Administered 2018-07-19: 0.25 mg via INTRAVENOUS

## 2018-07-19 MED ORDER — SODIUM CHLORIDE 0.9% FLUSH
10.0000 mL | INTRAVENOUS | Status: DC | PRN
  Administered 2018-07-19: 10 mL
  Filled 2018-07-19: qty 10

## 2018-07-19 MED ORDER — DOXORUBICIN HCL CHEMO IV INJECTION 2 MG/ML
20.0000 mg/m2 | Freq: Once | INTRAVENOUS | Status: AC
  Administered 2018-07-19: 36 mg via INTRAVENOUS
  Filled 2018-07-19: qty 18

## 2018-07-19 MED ORDER — SODIUM CHLORIDE 0.9 % IV SOLN
Freq: Once | INTRAVENOUS | Status: AC
  Administered 2018-07-19: 08:00:00 via INTRAVENOUS
  Filled 2018-07-19: qty 250

## 2018-07-19 MED ORDER — SODIUM CHLORIDE 0.9 % IV SOLN
400.0000 mg/m2 | Freq: Once | INTRAVENOUS | Status: AC
  Administered 2018-07-19: 720 mg via INTRAVENOUS
  Filled 2018-07-19: qty 36

## 2018-07-19 MED ORDER — HEPARIN SOD (PORK) LOCK FLUSH 100 UNIT/ML IV SOLN
500.0000 [IU] | Freq: Once | INTRAVENOUS | Status: AC | PRN
  Administered 2018-07-19: 500 [IU]
  Filled 2018-07-19: qty 5

## 2018-07-19 MED ORDER — PALONOSETRON HCL INJECTION 0.25 MG/5ML
INTRAVENOUS | Status: AC
  Filled 2018-07-19: qty 5

## 2018-07-19 MED ORDER — DENOSUMAB 120 MG/1.7ML ~~LOC~~ SOLN: 120.0000 mg | Freq: Once | SUBCUTANEOUS | Status: DC

## 2018-07-19 MED ORDER — SODIUM CHLORIDE 0.9 % IV SOLN
Freq: Once | INTRAVENOUS | Status: AC
  Administered 2018-07-19: 09:00:00 via INTRAVENOUS
  Filled 2018-07-19: qty 5

## 2018-07-19 NOTE — Patient Instructions (Signed)
[facility] Discharge Instructions for Patients Receiving Chemotherapy  Today you received the following chemotherapy agents Adriamycin and Cytoxan  To help prevent nausea and vomiting after your treatment, we encourage you to take your nausea medication as directed   If you develop nausea and vomiting that is not controlled by your nausea medication, call the clinic.   BELOW ARE SYMPTOMS THAT SHOULD BE REPORTED IMMEDIATELY:  *FEVER GREATER THAN 100.5 F  *CHILLS WITH OR WITHOUT FEVER  NAUSEA AND VOMITING THAT IS NOT CONTROLLED WITH YOUR NAUSEA MEDICATION  *UNUSUAL SHORTNESS OF BREATH  *UNUSUAL BRUISING OR BLEEDING  TENDERNESS IN MOUTH AND THROAT WITH OR WITHOUT PRESENCE OF ULCERS  *URINARY PROBLEMS  *BOWEL PROBLEMS  UNUSUAL RASH Items with * indicate a potential emergency and should be followed up as soon as possible.  Feel free to call the clinic should you have any questions or concerns. The clinic phone number is [phone].  Please show the [place] at check-in to the Emergency Department and triage nurse.   Doxorubicin (Adriamycin) injection What is this medicine? DOXORUBICIN (dox oh ROO bi sin) is a chemotherapy drug. It is used to treat many kinds of cancer like leukemia, lymphoma, neuroblastoma, sarcoma, and Wilms' tumor. It is also used to treat bladder cancer, breast cancer, lung cancer, ovarian cancer, stomach cancer, and thyroid cancer. This medicine may be used for other purposes; ask your health care provider or pharmacist if you have questions. COMMON BRAND NAME(S): Adriamycin, Adriamycin PFS, Adriamycin RDF, Rubex What should I tell my health care provider before I take this medicine? They need to know if you have any of these conditions: -heart disease -history of low blood counts caused by a medicine -liver disease -recent or ongoing radiation therapy -an unusual or allergic reaction to doxorubicin, other chemotherapy agents,  other medicines, foods, dyes, or preservatives -pregnant or trying to get pregnant -breast-feeding How should I use this medicine? This drug is given as an infusion into a vein. It is administered in a hospital or clinic by a specially trained health care professional. If you have pain, swelling, burning or any unusual feeling around the site of your injection, tell your health care professional right away. Talk to your pediatrician regarding the use of this medicine in children. Special care may be needed. Overdosage: If you think you have taken too much of this medicine contact a poison control center or emergency room at once. NOTE: This medicine is only for you. Do not share this medicine with others. What if I miss a dose? It is important not to miss your dose. Call your doctor or health care professional if you are unable to keep an appointment. What may interact with this medicine? This medicine may interact with the following medications: -6-mercaptopurine -paclitaxel -phenytoin -St. John's Wort -trastuzumab -verapamil This list may not describe all possible interactions. Give your health care provider a list of all the medicines, herbs, non-prescription drugs, or dietary supplements you use. Also tell them if you smoke, drink alcohol, or use illegal drugs. Some items may interact with your medicine. What should I watch for while using this medicine? This drug may make you feel generally unwell. This is not uncommon, as chemotherapy can affect healthy cells as well as cancer cells. Report any side effects. Continue your course of treatment even though you feel ill unless your doctor tells you to stop. There is a maximum amount of this medicine you should receive throughout your life. The amount  depends on the medical condition being treated and your overall health. Your doctor will watch how much of this medicine you receive in your lifetime. Tell your doctor if you have taken this  medicine before. You may need blood work done while you are taking this medicine. Your urine may turn red for a few days after your dose. This is not blood. If your urine is dark or brown, call your doctor. In some cases, you may be given additional medicines to help with side effects. Follow all directions for their use. Call your doctor or health care professional for advice if you get a fever, chills or sore throat, or other symptoms of a cold or flu. Do not treat yourself. This drug decreases your body's ability to fight infections. Try to avoid being around people who are sick. This medicine may increase your risk to bruise or bleed. Call your doctor or health care professional if you notice any unusual bleeding. Talk to your doctor about your risk of cancer. You may be more at risk for certain types of cancers if you take this medicine. Do not become pregnant while taking this medicine or for 6 months after stopping it. Women should inform their doctor if they wish to become pregnant or think they might be pregnant. Men should not father a child while taking this medicine and for 6 months after stopping it. There is a potential for serious side effects to an unborn child. Talk to your health care professional or pharmacist for more information. Do not breast-feed an infant while taking this medicine. This medicine has caused ovarian failure in some women and reduced sperm counts in some men This medicine may interfere with the ability to have a child. Talk with your doctor or health care professional if you are concerned about your fertility. What side effects may I notice from receiving this medicine? Side effects that you should report to your doctor or health care professional as soon as possible: -allergic reactions like skin rash, itching or hives, swelling of the face, lips, or tongue -breathing problems -chest pain -fast or irregular heartbeat -low blood counts - this medicine may  decrease the number of white blood cells, red blood cells and platelets. You may be at increased risk for infections and bleeding. -pain, redness, or irritation at site where injected -signs of infection - fever or chills, cough, sore throat, pain or difficulty passing urine -signs of decreased platelets or bleeding - bruising, pinpoint red spots on the skin, black, tarry stools, blood in the urine -swelling of the ankles, feet, hands -tiredness -weakness Side effects that usually do not require medical attention (report to your doctor or health care professional if they continue or are bothersome): -diarrhea -hair loss -mouth sores -nail discoloration or damage -nausea -red colored urine -vomiting This list may not describe all possible side effects. Call your doctor for medical advice about side effects. You may report side effects to FDA at 1-800-FDA-1088. Where should I keep my medicine? This drug is given in a hospital or clinic and will not be stored at home. NOTE: This sheet is a summary. It may not cover all possible information. If you have questions about this medicine, talk to your doctor, pharmacist, or health care provider.  2018 Elsevier/Gold Standard (2016-01-05 11:28:51)   Cyclophosphamide (Cytoxan) injection What is this medicine? CYCLOPHOSPHAMIDE (sye kloe FOSS fa mide) is a chemotherapy drug. It slows the growth of cancer cells. This medicine is used to treat many types  of cancer like lymphoma, myeloma, leukemia, breast cancer, and ovarian cancer, to name a few. This medicine may be used for other purposes; ask your health care provider or pharmacist if you have questions. COMMON BRAND NAME(S): Cytoxan, Neosar What should I tell my health care provider before I take this medicine? They need to know if you have any of these conditions: -blood disorders -history of other chemotherapy -infection -kidney disease -liver disease -recent or ongoing radiation  therapy -tumors in the bone marrow -an unusual or allergic reaction to cyclophosphamide, other chemotherapy, other medicines, foods, dyes, or preservatives -pregnant or trying to get pregnant -breast-feeding How should I use this medicine? This drug is usually given as an injection into a vein or muscle or by infusion into a vein. It is administered in a hospital or clinic by a specially trained health care professional. Talk to your pediatrician regarding the use of this medicine in children. Special care may be needed. Overdosage: If you think you have taken too much of this medicine contact a poison control center or emergency room at once. NOTE: This medicine is only for you. Do not share this medicine with others. What if I miss a dose? It is important not to miss your dose. Call your doctor or health care professional if you are unable to keep an appointment. What may interact with this medicine? This medicine may interact with the following medications: -amiodarone -amphotericin B -azathioprine -certain antiviral medicines for HIV or AIDS such as protease inhibitors (e.g., indinavir, ritonavir) and zidovudine -certain blood pressure medications such as benazepril, captopril, enalapril, fosinopril, lisinopril, moexipril, monopril, perindopril, quinapril, ramipril, trandolapril -certain cancer medications such as anthracyclines (e.g., daunorubicin, doxorubicin), busulfan, cytarabine, paclitaxel, pentostatin, tamoxifen, trastuzumab -certain diuretics such as chlorothiazide, chlorthalidone, hydrochlorothiazide, indapamide, metolazone -certain medicines that treat or prevent blood clots like warfarin -certain muscle relaxants such as succinylcholine -cyclosporine -etanercept -indomethacin -medicines to increase blood counts like filgrastim, pegfilgrastim, sargramostim -medicines used as general anesthesia -metronidazole -natalizumab This list may not describe all possible  interactions. Give your health care provider a list of all the medicines, herbs, non-prescription drugs, or dietary supplements you use. Also tell them if you smoke, drink alcohol, or use illegal drugs. Some items may interact with your medicine. What should I watch for while using this medicine? Visit your doctor for checks on your progress. This drug may make you feel generally unwell. This is not uncommon, as chemotherapy can affect healthy cells as well as cancer cells. Report any side effects. Continue your course of treatment even though you feel ill unless your doctor tells you to stop. Drink water or other fluids as directed. Urinate often, even at night. In some cases, you may be given additional medicines to help with side effects. Follow all directions for their use. Call your doctor or health care professional for advice if you get a fever, chills or sore throat, or other symptoms of a cold or flu. Do not treat yourself. This drug decreases your body's ability to fight infections. Try to avoid being around people who are sick. This medicine may increase your risk to bruise or bleed. Call your doctor or health care professional if you notice any unusual bleeding. Be careful brushing and flossing your teeth or using a toothpick because you may get an infection or bleed more easily. If you have any dental work done, tell your dentist you are receiving this medicine. You may get drowsy or dizzy. Do not drive, use machinery, or do anything  that needs mental alertness until you know how this medicine affects you. Do not become pregnant while taking this medicine or for 1 year after stopping it. Women should inform their doctor if they wish to become pregnant or think they might be pregnant. Men should not father a child while taking this medicine and for 4 months after stopping it. There is a potential for serious side effects to an unborn child. Talk to your health care professional or pharmacist for  more information. Do not breast-feed an infant while taking this medicine. This medicine may interfere with the ability to have a child. This medicine has caused ovarian failure in some women. This medicine has caused reduced sperm counts in some men. You should talk with your doctor or health care professional if you are concerned about your fertility. If you are going to have surgery, tell your doctor or health care professional that you have taken this medicine. What side effects may I notice from receiving this medicine? Side effects that you should report to your doctor or health care professional as soon as possible: -allergic reactions like skin rash, itching or hives, swelling of the face, lips, or tongue -low blood counts - this medicine may decrease the number of white blood cells, red blood cells and platelets. You may be at increased risk for infections and bleeding. -signs of infection - fever or chills, cough, sore throat, pain or difficulty passing urine -signs of decreased platelets or bleeding - bruising, pinpoint red spots on the skin, black, tarry stools, blood in the urine -signs of decreased red blood cells - unusually weak or tired, fainting spells, lightheadedness -breathing problems -dark urine -dizziness -palpitations -swelling of the ankles, feet, hands -trouble passing urine or change in the amount of urine -weight gain -yellowing of the eyes or skin Side effects that usually do not require medical attention (report to your doctor or health care professional if they continue or are bothersome): -changes in nail or skin color -hair loss -missed menstrual periods -mouth sores -nausea, vomiting This list may not describe all possible side effects. Call your doctor for medical advice about side effects. You may report side effects to FDA at 1-800-FDA-1088. Where should I keep my medicine? This drug is given in a hospital or clinic and will not be stored at  home. NOTE: This sheet is a summary. It may not cover all possible information. If you have questions about this medicine, talk to your doctor, pharmacist, or health care provider.  2018 Elsevier/Gold Standard (2012-09-22 16:22:58)

## 2018-07-19 NOTE — Progress Notes (Signed)
Per RN/MD, hold off on giving Xgeva injections for the next month while getting chemo started. Order for today discontinued.   Demetrius Charity, PharmD Oncology Pharmacist Pharmacy Phone: [phone] 07/19/2018

## 2018-07-20 ENCOUNTER — Observation Stay (HOSPITAL_COMMUNITY)
Admission: EM | Admit: 2018-07-20 | Discharge: 2018-07-21 | Disposition: A | Discharge status: Home / Self Care | Payer: BC Managed Care – PPO | Attending: Internal Medicine | Admitting: Internal Medicine

## 2018-07-20 ENCOUNTER — Emergency Department (HOSPITAL_COMMUNITY): Payer: BC Managed Care – PPO

## 2018-07-20 ENCOUNTER — Ambulatory Visit (HOSPITAL_COMMUNITY): Payer: BC Managed Care – PPO

## 2018-07-20 ENCOUNTER — Other Ambulatory Visit: Payer: Self-pay

## 2018-07-20 DIAGNOSIS — C787 Secondary malignant neoplasm of liver and intrahepatic bile duct: Secondary | ICD-10-CM | POA: Insufficient documentation

## 2018-07-20 DIAGNOSIS — C7931 Secondary malignant neoplasm of brain: Secondary | ICD-10-CM

## 2018-07-20 DIAGNOSIS — D539 Nutritional anemia, unspecified: Secondary | ICD-10-CM | POA: Diagnosis not present

## 2018-07-20 DIAGNOSIS — C7951 Secondary malignant neoplasm of bone: Secondary | ICD-10-CM | POA: Diagnosis not present

## 2018-07-20 DIAGNOSIS — D630 Anemia in neoplastic disease: Secondary | ICD-10-CM | POA: Diagnosis not present

## 2018-07-20 DIAGNOSIS — N289 Disorder of kidney and ureter, unspecified: Secondary | ICD-10-CM

## 2018-07-20 DIAGNOSIS — R569 Unspecified convulsions: Secondary | ICD-10-CM | POA: Diagnosis not present

## 2018-07-20 DIAGNOSIS — Z7982 Long term (current) use of aspirin: Secondary | ICD-10-CM | POA: Insufficient documentation

## 2018-07-20 DIAGNOSIS — C7800 Secondary malignant neoplasm of unspecified lung: Secondary | ICD-10-CM | POA: Diagnosis not present

## 2018-07-20 DIAGNOSIS — C50912 Malignant neoplasm of unspecified site of left female breast: Secondary | ICD-10-CM | POA: Insufficient documentation

## 2018-07-20 DIAGNOSIS — N179 Acute kidney failure, unspecified: Secondary | ICD-10-CM | POA: Insufficient documentation

## 2018-07-20 DIAGNOSIS — D72829 Elevated white blood cell count, unspecified: Secondary | ICD-10-CM | POA: Diagnosis not present

## 2018-07-20 DIAGNOSIS — C50919 Malignant neoplasm of unspecified site of unspecified female breast: Secondary | ICD-10-CM | POA: Diagnosis present

## 2018-07-20 DIAGNOSIS — F419 Anxiety disorder, unspecified: Secondary | ICD-10-CM | POA: Diagnosis not present

## 2018-07-20 DIAGNOSIS — D696 Thrombocytopenia, unspecified: Secondary | ICD-10-CM | POA: Diagnosis present

## 2018-07-20 DIAGNOSIS — Z87891 Personal history of nicotine dependence: Secondary | ICD-10-CM | POA: Insufficient documentation

## 2018-07-20 LAB — BASIC METABOLIC PANEL
Anion gap: 16 — ABNORMAL HIGH (ref 5–15)
BUN: 16 mg/dL (ref 8–23)
CHLORIDE: 106 mmol/L (ref 98–111)
CO2: 15 mmol/L — ABNORMAL LOW (ref 22–32)
Calcium: 7.5 mg/dL — ABNORMAL LOW (ref 8.9–10.3)
Creatinine, Ser: 1.02 mg/dL — ABNORMAL HIGH (ref 0.44–1.00)
GFR, EST NON AFRICAN AMERICAN: 58 mL/min — AB (ref >60)
Glucose, Bld: 142 mg/dL — ABNORMAL HIGH (ref 70–99)
Potassium: 5 mmol/L (ref 3.5–5.1)
SODIUM: 137 mmol/L (ref 135–145)

## 2018-07-20 LAB — CBG MONITORING, ED: Glucose-Capillary: 135 mg/dL — ABNORMAL HIGH (ref 70–99)

## 2018-07-20 LAB — CBC
HCT: 33.8 % — ABNORMAL LOW (ref 36.0–46.0)
Hemoglobin: 11.1 g/dL — ABNORMAL LOW (ref 12.0–15.0)
MCH: 33.3 pg (ref 26.0–34.0)
MCHC: 32.8 g/dL (ref 30.0–36.0)
MCV: 101.5 fL — AB (ref 78.0–100.0)
Platelets: 107 10*3/uL — ABNORMAL LOW (ref 150–400)
RBC: 3.33 MIL/uL — AB (ref 3.87–5.11)
RDW: 19.9 % — AB (ref 11.5–15.5)
WBC: 13.9 10*3/uL — AB (ref 4.0–10.5)

## 2018-07-20 MED ORDER — LORAZEPAM 2 MG/ML IJ SOLN: 1.0000 mg | Freq: Four times a day (QID) | INTRAMUSCULAR | Status: DC | PRN

## 2018-07-20 MED ORDER — IOHEXOL 300 MG/ML  SOLN
75.0000 mL | Freq: Once | INTRAMUSCULAR | Status: AC | PRN
  Administered 2018-07-20: 75 mL via INTRAVENOUS

## 2018-07-20 MED ORDER — LEVETIRACETAM IN NACL 1000 MG/100ML IV SOLN
1000.0000 mg | Freq: Once | INTRAVENOUS | Status: AC
  Administered 2018-07-20: 1000 mg via INTRAVENOUS
  Filled 2018-07-20: qty 100

## 2018-07-20 MED ORDER — DEXAMETHASONE SODIUM PHOSPHATE 10 MG/ML IJ SOLN
6.0000 mg | Freq: Four times a day (QID) | INTRAMUSCULAR | Status: DC
  Administered 2018-07-21: 6 mg via INTRAVENOUS
  Filled 2018-07-20: qty 1

## 2018-07-20 NOTE — ED Triage Notes (Signed)
Patient arrived via EMS, from home, per ems pt had witnessed seizure activity that lasted about 3 min per her sister. Patient does not have previous seizure hx, no head injury. 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 Per ems pt was post-ictal with confusion, now aox4. No other complaints at this time.

## 2018-07-20 NOTE — ED Provider Notes (Signed)
[facility] EMERGENCY DEPARTMENT Provider Note   CSN: [id number] Arrival date & time: 07/20/18  1832     History   Chief Complaint Chief Complaint  Patient presents with  . Seizures    HPI Stacey Cox is a 62 y.o. female.  HPI   Patient was at home with family members today when she had 2 back-to-back seizures approximately 3 minutes apiece, with "foaming at the mouth," and generalized shaking.  After that she was postictal with decreased responsiveness and "staring off into space," for about 10 minutes.  No prior history of seizure.  She received her first chemotherapy, for breast cancer, metastatic, yesterday.  No recent fever, chills, cough, shortness of breath, nausea, vomiting, focal weakness or paresthesia.  There are no other known modifying factors.  Past Medical History:  Diagnosis Date  . Cancer [facility])    Metastatic Breast Cancer    Patient Active Problem List   Diagnosis Date Noted  . Breast cancer metastasized to brain ([place]) 07/20/2018  . Port-A-Cath in place 07/17/2018  . Liver metastases ([place]) 07/12/2018  . Genetic testing 08/08/2017  . Palliative care status   . Counseling regarding advance care planning and goals of care 03/25/2017  . 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 Nausea without vomiting 03/21/2017  . Bone metastases ([place]) 02/01/2017  . Osseous metastasis ([place])   . Malignant neoplasm metastatic to lung ([place])   . Cancer associated pain   . Metastatic breast cancer ([place]) 01/21/2017  . Pathologic fracture   . Breast mass, left     Past Surgical History:  Procedure Laterality Date  . CHOLECYSTECTOMY    . IR IMAGING GUIDED PORT INSERTION  07/14/2018  . TONSILLECTOMY       OB History   None      Home Medications    Prior to Admission medications   Medication Sig Start Date End Date Taking? Authorizing Provider  aspirin EC 81 MG tablet Take 1 tablet (81 mg total) by mouth daily. 08/01/17  Yes [name], MD  dexamethasone (DECADRON) 4 MG tablet Take 2 tablets by mouth once a day on the day after chemotherapy and then take 2 tablets two times a day for 2 days. Take with food. 07/12/18  Yes [name], MD  escitalopram (LEXAPRO) 20 MG tablet TAKE 1 TABLET(20 MG) BY MOUTH DAILY Patient taking differently: Take 20 mg by mouth daily as needed (anxiety).  06/19/18  Yes [name], MD  hydrOXYzine (ATARAX/VISTARIL) 10 MG tablet Take 2.5 tablets (25 mg total) by mouth 2 (two) times daily as needed for nausea or vomiting. 01/19/18  Yes [name], MD  lidocaine-prilocaine (EMLA) cream Apply to affected area once Patient taking differently: Apply 1 application topically daily as needed (pain).  07/12/18  Yes [name], MD  loperamide (IMODIUM) 1 MG/5ML solution Take 2 mg by mouth as needed for diarrhea or loose stools.    Yes [provider]  LORazepam (ATIVAN) 0.5 MG tablet Take 1 tablet (0.5 mg total) by mouth every 6 (six) hours as needed. for anxiety 03/13/18  Yes Perlov, Marinell Blight, MD  ondansetron (ZOFRAN) 4 MG tablet Take 1 tablet (4 mg total) by mouth 2 (two) times daily. Take with Verzenio. Patient taking differently: Take 4 mg by mouth every 8 (eight) hours as needed for nausea or vomiting.  01/24/18  Yes [name], MD  potassium chloride SA (K-DUR,KLOR-CON) 20 MEQ tablet Take 1 tablet (20 mEq total)  by mouth 2 (two) times daily. 07/17/18  Yes [name], MD  scopolamine (TRANSDERM-SCOP) 1 MG/3DAYS Place 1 patch (1.5 mg total) onto the skin every 3 (three) days. 07/07/18  Yes [name], MD  Vitamin D, Ergocalciferol, (DRISDOL) 50000 units CAPS capsule TAKE 1 CAPSULE BY MOUTH 3 TIMES WEEKLY 05/07/18  Yes [name], MD  abemaciclib (VERZENIO) 150 MG tablet TAKE 1 TABLET (150 MG TOTAL) BY MOUTH 2 TIMES DAILY. Patient not taking: Reported on 07/20/2018 03/21/18   [name], MD  dexamethasone (DECADRON) 4  MG tablet Take 1 tablet (4 mg total) by mouth daily with breakfast. Patient not taking: Reported on 07/20/2018 07/07/18   [name], MD  HYDROcodone-acetaminophen (NORCO/VICODIN) 5-325 MG tablet Take 1-2 tablets by mouth every 4 (four) hours as needed for moderate pain or severe pain. Patient not taking: Reported on 07/20/2018 08/20/17   Charlesetta Shanks, MD  letrozole [facility]) 2.5 MG tablet Take 1 tablet (2.5mg ) by mouth daily. Patient not taking: Reported on 07/20/2018 03/21/18   [name], MD  letrozole [facility]) 2.5 MG tablet TAKE 1 TABLET(2.5 MG) BY MOUTH DAILY Patient not taking: Reported on 07/20/2018 04/25/18   [name], MD  ondansetron (ZOFRAN) 4 MG tablet TAKE 1 TABLET(4 MG) BY MOUTH EVERY 8 HOURS AS NEEDED FOR NAUSEA OR VOMITING Patient not taking: Reported on 07/20/2018 07/06/18   [name], MD    Family History Family History  Problem Relation Age of Onset  . Cancer Mother        breast  . Cancer Sister        breast    Social History Social History   Tobacco Use  . Smoking status: Former Smoker    Packs/day: 0.00    Types: Cigarettes    Last attempt to quit: 01/21/2017    Years since quitting: 1.4  . Smokeless tobacco: Never Used  Substance Use Topics  . Alcohol use: No    Alcohol/week: 0.0 standard drinks  . Drug use: No     Allergies   Thorazine [chlorpromazine] and Ribociclib   Review of Systems Review of Systems  All other systems reviewed and are negative.    Physical Exam Updated Vital Signs BP 96/68   Pulse 74   Temp 99.1 F (37.3 C) (Oral)   Resp 20   Ht 5\' 5"  (1.651 m)   Wt 73.9 kg   SpO2 95%   BMI 27.12 kg/m   Physical Exam  Constitutional: She is oriented to person, place, and time. She appears well-developed and well-nourished. No distress.  HENT:  Head: Normocephalic and atraumatic.  Eyes: Pupils are equal, round, and reactive to light. Conjunctivae and EOM are normal.  Neck: Normal range of  motion and phonation normal. Neck supple.  Cardiovascular: Normal rate and regular rhythm.  Healing Port-A-Cath site right upper anterior chest wall, site appears normal.  Pulmonary/Chest: Effort normal and breath sounds normal. She exhibits no tenderness.  Abdominal: Soft. She exhibits no distension. There is no tenderness. There is no guarding.  Musculoskeletal: Normal range of motion.  Neurological: She is alert and oriented to person, place, and time. She exhibits normal muscle tone.  No dysarthria, aphasia or nystagmus.  Skin: Skin is warm and dry.  Psychiatric: She has a normal mood and affect. Her behavior is normal. Judgment and thought content normal.  Nursing note and vitals reviewed.    ED Treatments / Results  Labs (all labs ordered are listed, but  only abnormal results are displayed) Labs Reviewed  BASIC METABOLIC PANEL - Abnormal; Notable for the following components:      Result Value   CO2 15 (*)    Glucose, Bld 142 (*)    Creatinine, Ser 1.02 (*)    Calcium 7.5 (*)    GFR calc non Af Amer 58 (*)    Anion gap 16 (*)    All other components within normal limits  CBC - Abnormal; Notable for the following components:   WBC 13.9 (*)    RBC 3.33 (*)    Hemoglobin 11.1 (*)    HCT 33.8 (*)    MCV 101.5 (*)    RDW 19.9 (*)    Platelets 107 (*)    All other components within normal limits  CBG MONITORING, ED - Abnormal; Notable for the following components:   Glucose-Capillary 135 (*)    All other components within normal limits    EKG None  Radiology Ct Head W Or Wo Contrast  Result Date: 07/20/2018 CLINICAL DATA:  New onset witnessed seizures. History of metastatic breast cancer. EXAM: CT HEAD WITHOUT AND WITH CONTRAST TECHNIQUE: Contiguous axial images were obtained from the base of the skull through the vertex without and with intravenous contrast CONTRAST:  75mL OMNIPAQUE IOHEXOL 300 MG/ML  SOLN COMPARISON:  MRI head January 22, 2017 FINDINGS: BRAIN: Irregular  cystic and solid 1.8 x 2.4 x 3.9 cm LEFT frontal cortical based mass with potential dural component. Severe surrounding vasogenic edema with mass effect on LEFT frontal horn of the lateral ventricle. 5 mm resultant LEFT to RIGHT subfalcine herniation. Stable appearance of RIGHT posterior fossa 8 x 12 mm enhancing mass most compatible with meningioma. Similar characteristic 6 mm prepontine mass. No hydrocephalus. Stable appearance of symmetric inferior cerebellar atrophy VASCULAR: Moderate calcific atherosclerosis of the carotid siphons. SKULL: No skull fracture. Sclerotic expanded LEFT parietooccipital calvarium consistent with metastatic disease. 2.3 cm sclerotic metastasis RIGHT frontal calvarium. No significant scalp soft tissue swelling. SINUSES/ORBITS: Severe frontal sinusitis. Mastoid air cells are well aerated.The included ocular globes and orbital contents are non-suspicious. OTHER: None. IMPRESSION: 1. 1.8 x 2.4 x 3.9 cm LEFT frontal cortical based mass with possible dural component highly concerning for metastatic disease, less likely abscess or cerebritis. Severe vasogenic edema with 5 mm LEFT to RIGHT subfalcine herniation. No ventricular entrapment. Recommend MRI of the brain with without contrast. 2. 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 3. Multiple calvarial metastasis. 4. Cerebellar atrophy. 5. Acute findings discussed with and reconfirmed by Dr.Nickolette Espinola on 07/20/2018 at 9:41 pm. Electronically Signed   By: Elon Alas M.D.   On: 07/20/2018 21:42    Procedures .Critical Care Performed by: Daleen Bo, MD Authorized by: Daleen Bo, MD   Critical care provider statement:    Critical care time (minutes):  35   Critical care start time:  07/20/2018 7:55 PM   Critical care end time:  07/20/2018 11:56 PM   Critical care time was exclusive of:  Separately billable procedures and treating other patients   Critical care was necessary  to treat or prevent imminent or life-threatening deterioration of the following conditions:  CNS failure or compromise   Critical care was time spent personally by me on the following activities:  Blood draw for specimens, development of treatment plan with patient or surrogate, discussions with consultants, evaluation of patient's response to treatment, examination of patient, obtaining history from patient or surrogate, ordering  and performing treatments and interventions, ordering and review of laboratory studies, pulse oximetry, re-evaluation of patient's condition, review of old charts and ordering and review of radiographic studies   (including critical care time)  Medications Ordered in ED Medications  dexamethasone (DECADRON) injection 6 mg (has no administration in time range)  LORazepam (ATIVAN) injection 1 mg (has no administration in time range)  iohexol (OMNIPAQUE) 300 MG/ML solution 75 mL (75 mLs Intravenous Contrast Given 07/20/18 2017)  levETIRAcetam (KEPPRA) IVPB 1000 mg/100 mL premix ( Intravenous Stopped 07/20/18 2232)     Initial Impression / Assessment and Plan / ED Course  I have reviewed the triage vital signs and the nursing notes.  Pertinent labs & imaging results that were available during my care of the patient were reviewed by me and considered in my medical decision making (see chart for details).      Patient Vitals for the past 24 hrs:  BP Temp Temp src Pulse Resp SpO2 Height Weight  07/20/18 2330 96/68 - - 74 20 95 % - -  07/20/18 2300 98/69 - - 75 16 96 % - -  07/20/18 2230 97/62 - - 77 16 96 % - -  07/20/18 2200 115/75 - - 77 17 93 % - -  07/20/18 2145 108/73 - - 77 15 96 % - -  07/20/18 2000 101/63 - - 86 18 97 % - -  07/20/18 1930 119/83 - - 87 15 94 % - -  07/20/18 1900 113/86 - - 91 - 97 % - -  07/20/18 1845 - - - 95 14 99 % - -  07/20/18 1841 102/67 99.1 F (37.3 C) Oral 95 20 98 % - -  07/20/18 1839 102/67 - - - 19 - 5\' 5"  (1.651 m) 73.9 kg    07/20/18 1833 - - - - - 98 % - -   11:15 PM Case discussed with on-call oncology who recommends admit patient to [facility], for further treatment.  Start Decadron.  11:56 PM Reevaluation with update and discussion. After initial assessment and treatment, an updated evaluation reveals no change in status.  No seizures in ED, findings discussed with patient and family members, all questions answered. Daleen Bo   Medical Decision Making: Metastatic breast cancer with new brain lesion associated with edema, likely cause of seizure.  Patient required initiation of antiepileptic therapy.  She will require admission with further work-up including MRI, and likely radiation with possible metastasis resection.  CRITICAL CARE-yes Performed by: Daleen Bo   Nursing Notes Reviewed/ Care Coordinated Applicable Imaging Reviewed Interpretation of Laboratory Data incorporated into ED treatment   11:24 PM-Consult complete with hospitalist. Patient case explained and discussed.  He agrees to admit patient for further evaluation and treatment. Call ended at 1132  Plan: Admit    Final Clinical Impressions(s) / ED Diagnoses   Final diagnoses:  Seizure ([place])  Brain metastases ([place])  Malignant neoplasm of female breast, unspecified estrogen receptor status, unspecified laterality, unspecified site of breast [facility])    ED Discharge Orders    None       Daleen Bo, MD 07/20/18 2356

## 2018-07-21 ENCOUNTER — Ambulatory Visit: Payer: BC Managed Care – PPO

## 2018-07-21 ENCOUNTER — Encounter (HOSPITAL_COMMUNITY): Payer: Self-pay | Admitting: Family Medicine

## 2018-07-21 ENCOUNTER — Inpatient Hospital Stay (HOSPITAL_COMMUNITY): Payer: BC Managed Care – PPO

## 2018-07-21 DIAGNOSIS — F419 Anxiety disorder, unspecified: Secondary | ICD-10-CM | POA: Diagnosis not present

## 2018-07-21 DIAGNOSIS — D539 Nutritional anemia, unspecified: Secondary | ICD-10-CM | POA: Diagnosis present

## 2018-07-21 DIAGNOSIS — Z7982 Long term (current) use of aspirin: Secondary | ICD-10-CM | POA: Diagnosis not present

## 2018-07-21 DIAGNOSIS — C50919 Malignant neoplasm of unspecified site of unspecified female breast: Secondary | ICD-10-CM | POA: Diagnosis not present

## 2018-07-21 DIAGNOSIS — C50912 Malignant neoplasm of unspecified site of left female breast: Secondary | ICD-10-CM | POA: Diagnosis not present

## 2018-07-21 DIAGNOSIS — Z170 Estrogen receptor positive status [ER+]: Secondary | ICD-10-CM | POA: Diagnosis not present

## 2018-07-21 DIAGNOSIS — C787 Secondary malignant neoplasm of liver and intrahepatic bile duct: Secondary | ICD-10-CM | POA: Diagnosis not present

## 2018-07-21 DIAGNOSIS — D696 Thrombocytopenia, unspecified: Secondary | ICD-10-CM

## 2018-07-21 DIAGNOSIS — C7800 Secondary malignant neoplasm of unspecified lung: Secondary | ICD-10-CM | POA: Diagnosis not present

## 2018-07-21 DIAGNOSIS — R569 Unspecified convulsions: Secondary | ICD-10-CM

## 2018-07-21 DIAGNOSIS — C7931 Secondary malignant neoplasm of brain: Secondary | ICD-10-CM | POA: Diagnosis present

## 2018-07-21 DIAGNOSIS — C7951 Secondary malignant neoplasm of bone: Secondary | ICD-10-CM | POA: Diagnosis not present

## 2018-07-21 DIAGNOSIS — D630 Anemia in neoplastic disease: Secondary | ICD-10-CM | POA: Diagnosis not present

## 2018-07-21 DIAGNOSIS — Z87891 Personal history of nicotine dependence: Secondary | ICD-10-CM

## 2018-07-21 DIAGNOSIS — N289 Disorder of kidney and ureter, unspecified: Secondary | ICD-10-CM

## 2018-07-21 DIAGNOSIS — D72829 Elevated white blood cell count, unspecified: Secondary | ICD-10-CM | POA: Diagnosis not present

## 2018-07-21 DIAGNOSIS — C50911 Malignant neoplasm of unspecified site of right female breast: Secondary | ICD-10-CM

## 2018-07-21 DIAGNOSIS — N179 Acute kidney failure, unspecified: Secondary | ICD-10-CM | POA: Diagnosis not present

## 2018-07-21 LAB — CBC
HEMATOCRIT: 30.3 % — AB (ref 36.0–46.0)
HEMOGLOBIN: 10.4 g/dL — AB (ref 12.0–15.0)
MCH: 33.2 pg (ref 26.0–34.0)
MCHC: 34.3 g/dL (ref 30.0–36.0)
MCV: 96.8 fL (ref 78.0–100.0)
PLATELETS: 106 10*3/uL — AB (ref 150–400)
RBC: 3.13 MIL/uL — AB (ref 3.87–5.11)
RDW: 20.2 % — ABNORMAL HIGH (ref 11.5–15.5)
WBC: 10.3 10*3/uL (ref 4.0–10.5)

## 2018-07-21 LAB — BASIC METABOLIC PANEL
ANION GAP: 12 (ref 5–15)
BUN: 20 mg/dL (ref 8–23)
CHLORIDE: 108 mmol/L (ref 98–111)
CO2: 21 mmol/L — ABNORMAL LOW (ref 22–32)
Calcium: 7.4 mg/dL — ABNORMAL LOW (ref 8.9–10.3)
Creatinine, Ser: 0.72 mg/dL (ref 0.44–1.00)
GFR calc non Af Amer: >60 mL/min (ref >60)
Glucose, Bld: 172 mg/dL — ABNORMAL HIGH (ref 70–99)
POTASSIUM: 4.2 mmol/L (ref 3.5–5.1)
SODIUM: 141 mmol/L (ref 135–145)

## 2018-07-21 LAB — GLUCOSE, CAPILLARY: GLUCOSE-CAPILLARY: 101 mg/dL — AB (ref 70–99)

## 2018-07-21 LAB — HEPATIC FUNCTION PANEL
ALBUMIN: 2.1 g/dL — AB (ref 3.5–5.0)
ALT: 135 U/L — ABNORMAL HIGH (ref 0–44)
AST: 275 U/L — ABNORMAL HIGH (ref 15–41)
Alkaline Phosphatase: 367 U/L — ABNORMAL HIGH (ref 38–126)
BILIRUBIN TOTAL: 4 mg/dL — AB (ref 0.3–1.2)
Bilirubin, Direct: 2.4 mg/dL — ABNORMAL HIGH (ref 0.0–0.2)
Indirect Bilirubin: 1.6 mg/dL — ABNORMAL HIGH (ref 0.3–0.9)
TOTAL PROTEIN: 5.1 g/dL — AB (ref 6.5–8.1)

## 2018-07-21 MED ORDER — LEVETIRACETAM 750 MG PO TABS: 750.0000 mg | ORAL_TABLET | Freq: Two times a day (BID) | ORAL | 0 refills | Status: DC

## 2018-07-21 MED ORDER — DEXAMETHASONE SODIUM PHOSPHATE 4 MG/ML IJ SOLN
4.0000 mg | Freq: Four times a day (QID) | INTRAMUSCULAR | Status: DC
  Administered 2018-07-21 (×2): 4 mg via INTRAVENOUS
  Filled 2018-07-21 (×2): qty 1

## 2018-07-21 MED ORDER — LIDOCAINE-PRILOCAINE 2.5-2.5 % EX CREA: 1.0000 "application " | TOPICAL_CREAM | Freq: Every day | CUTANEOUS | Status: DC | PRN

## 2018-07-21 MED ORDER — PANTOPRAZOLE SODIUM 40 MG PO TBEC: 40.0000 mg | DELAYED_RELEASE_TABLET | Freq: Every day | ORAL | 0 refills | Status: DC

## 2018-07-21 MED ORDER — DEXAMETHASONE 4 MG PO TABS: 4.0000 mg | ORAL_TABLET | Freq: Every day | ORAL | 0 refills | Status: DC

## 2018-07-21 MED ORDER — LORAZEPAM 0.5 MG PO TABS: 0.5000 mg | ORAL_TABLET | Freq: Four times a day (QID) | ORAL | Status: DC | PRN

## 2018-07-21 MED ORDER — ACETAMINOPHEN 650 MG RE SUPP: 650.0000 mg | Freq: Four times a day (QID) | RECTAL | Status: DC | PRN

## 2018-07-21 MED ORDER — DEXAMETHASONE 4 MG PO TABS: 4.0000 mg | ORAL_TABLET | Freq: Every day | ORAL | Status: DC

## 2018-07-21 MED ORDER — GADOBENATE DIMEGLUMINE 529 MG/ML IV SOLN
15.0000 mL | Freq: Once | INTRAVENOUS | Status: AC | PRN
  Administered 2018-07-21: 15 mL via INTRAVENOUS

## 2018-07-21 MED ORDER — MORPHINE SULFATE (PF) 4 MG/ML IV SOLN: 3.0000 mg | INTRAVENOUS | Status: DC | PRN

## 2018-07-21 MED ORDER — ONDANSETRON HCL 4 MG/2ML IJ SOLN: 4.0000 mg | Freq: Four times a day (QID) | INTRAMUSCULAR | Status: DC | PRN

## 2018-07-21 MED ORDER — ENSURE ENLIVE PO LIQD: 237.0000 mL | Freq: Two times a day (BID) | ORAL | 12 refills | Status: AC

## 2018-07-21 MED ORDER — HYDROCODONE-ACETAMINOPHEN 5-325 MG PO TABS: 1.0000 | ORAL_TABLET | ORAL | Status: DC | PRN

## 2018-07-21 MED ORDER — SODIUM CHLORIDE 0.9 % IV SOLN
750.0000 mg | Freq: Two times a day (BID) | INTRAVENOUS | Status: DC
  Administered 2018-07-21: 750 mg via INTRAVENOUS
  Filled 2018-07-21 (×3): qty 7.5

## 2018-07-21 MED ORDER — LEVETIRACETAM 500 MG PO TABS
750.0000 mg | ORAL_TABLET | Freq: Two times a day (BID) | ORAL | Status: DC
  Filled 2018-07-21: qty 1

## 2018-07-21 MED ORDER — DEXAMETHASONE SODIUM PHOSPHATE 10 MG/ML IJ SOLN: 10.0000 mg | Freq: Once | INTRAMUSCULAR | Status: DC

## 2018-07-21 MED ORDER — SODIUM CHLORIDE 0.9 % IV SOLN
INTRAVENOUS | Status: AC
  Administered 2018-07-21: 02:00:00 via INTRAVENOUS

## 2018-07-21 MED ORDER — BISACODYL 5 MG PO TBEC: 5.0000 mg | DELAYED_RELEASE_TABLET | Freq: Every day | ORAL | Status: DC | PRN

## 2018-07-21 MED ORDER — SENNOSIDES-DOCUSATE SODIUM 8.6-50 MG PO TABS: 1.0000 | ORAL_TABLET | Freq: Every evening | ORAL | Status: DC | PRN

## 2018-07-21 MED ORDER — ENSURE ENLIVE PO LIQD
237.0000 mL | Freq: Two times a day (BID) | ORAL | Status: DC
  Administered 2018-07-21: 237 mL via ORAL

## 2018-07-21 MED ORDER — ESCITALOPRAM OXALATE 20 MG PO TABS
20.0000 mg | ORAL_TABLET | Freq: Every day | ORAL | Status: DC
  Administered 2018-07-21: 20 mg via ORAL
  Filled 2018-07-21: qty 1

## 2018-07-21 MED ORDER — SCOPOLAMINE 1 MG/3DAYS TD PT72
1.0000 | MEDICATED_PATCH | TRANSDERMAL | Status: DC
  Administered 2018-07-21: 1.5 mg via TRANSDERMAL
  Filled 2018-07-21: qty 1

## 2018-07-21 MED ORDER — ONDANSETRON HCL 4 MG PO TABS: 4.0000 mg | ORAL_TABLET | Freq: Four times a day (QID) | ORAL | Status: DC | PRN

## 2018-07-21 MED ORDER — ACETAMINOPHEN 325 MG PO TABS: 650.0000 mg | ORAL_TABLET | Freq: Four times a day (QID) | ORAL | Status: DC | PRN

## 2018-07-21 MED ORDER — PANTOPRAZOLE SODIUM 40 MG PO TBEC
40.0000 mg | DELAYED_RELEASE_TABLET | Freq: Every day | ORAL | Status: DC
  Administered 2018-07-21: 40 mg via ORAL
  Filled 2018-07-21: qty 1

## 2018-07-21 MED ORDER — DEXAMETHASONE SODIUM PHOSPHATE 4 MG/ML IJ SOLN: 4.0000 mg | Freq: Four times a day (QID) | INTRAMUSCULAR | Status: DC

## 2018-07-21 NOTE — Evaluation (Signed)
Occupational Therapy Evaluation Patient Details Name: Stacey Cox MRN: [id number] DOB: August 25, 1956 Today's Date: 07/21/2018    History of Present Illness pt was admitted after a seizure at home. She has breast CA with mets to liver and recently had first chemo. PMH:  anxiety   Clinical Impression   This 62 year old female was admitted for the above. She feels like she is back to baseline, but she did tend to hold to furniture when walking to the bathroom, and this was her first time walking since admission.   No LOB. She has 24/7 and a shower seat at home.      Follow Up Recommendations  Supervision/Assistance - 24 hour    Equipment Recommendations  None recommended by OT    Recommendations for Other Services       Precautions / Restrictions Precautions Precautions: Fall Restrictions Weight Bearing Restrictions: No      Mobility Bed Mobility Overal bed mobility: Independent                Transfers Overall transfer level: Independent               General transfer comment: steady with sit to stand    Balance Overall balance assessment: Mild deficits observed, not formally tested                                         ADL either performed or assessed with clinical judgement   ADL Overall ADL's : Needs assistance/impaired                                       General ADL Comments: pt feels back to baseline. She completed ADL earlier and has performed SPT to 3:1 commode with nursing.  Walked to bathroom with min guard for safety. Pt tended to "furniture walk"  A little unsteady but no LOB. She will have 24/7 at home and she has a Water engineer     Praxis      Pertinent Vitals/Pain Pain Assessment: No/denies pain     Hand Dominance     Extremity/Trunk Assessment Upper Extremity Assessment Upper Extremity Assessment: Overall WFL for tasks assessed           Communication  Communication Communication: No difficulties   Cognition Arousal/Alertness: Awake/alert Behavior During Therapy: WFL for tasks assessed/performed Overall Cognitive Status: Within Functional Limits for tasks assessed                                     General Comments       Exercises     Shoulder Instructions      Home Living Family/patient expects to be discharged to:: Private residence Living Arrangements: Other relatives Available Help at Discharge: Available 24 hours/day;Family;Friend(s)               Bathroom Shower/Tub: Walk-in Psychologist, prison and probation services: Standard     Home Equipment: Shower seat          Prior Functioning/Environment Level of Independence: Independent                 OT Problem List: Impaired  balance (sitting and/or standing);Decreased activity tolerance      OT Treatment/Interventions:      OT Goals(Current goals can be found in the care plan section) Acute Rehab OT Goals Patient Stated Goal: home today OT Goal Formulation: All assessment and education complete, DC therapy  OT Frequency:     Barriers to D/C:            Co-evaluation              AM-PAC PT "6 Clicks" Daily Activity     Outcome Measure Help from another person eating meals?: None Help from another person taking care of personal grooming?: A Little Help from another person toileting, which includes using toliet, bedpan, or urinal?: A Little Help from another person bathing (including washing, rinsing, drying)?: A Little Help from another person to put on and taking off regular upper body clothing?: A Little Help from another person to put on and taking off regular lower body clothing?: A Little 6 Click Score: 19   End of Session    Activity Tolerance: Patient tolerated treatment well Patient left: in bed;with call bell/phone within reach;with bed alarm set  OT Visit Diagnosis: Unsteadiness on feet (R26.81)                Time:  4818-5631 OT Time Calculation (min): 11 min Charges:  OT General Charges $OT Visit: 1 Visit OT Evaluation $OT Eval Low Complexity: 1 Low  [place], OTR/L [phone] 07/21/2018  [place] 07/21/2018, 4:07 PM

## 2018-07-21 NOTE — Progress Notes (Signed)
Initial Nutrition Assessment  INTERVENTION:   Ensure Enlive po BID, each supplement provides 350 kcal and 20 grams of protein  NUTRITION DIAGNOSIS:   Increased nutrient needs related to cancer and cancer related treatments as evidenced by estimated needs.  GOAL:   Patient will meet greater than or equal to 90% of their needs  MONITOR:   PO intake, Supplement acceptance, Labs, Weight trends, I & O's  REASON FOR ASSESSMENT:   Malnutrition Screening Tool    ASSESSMENT:   62 y.o. 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  Patient is following with oncology for her cancer and underwent her first chemotherapy infusion yesterday.   Patient currently consuming 20% of meals. Pt is s/p first chemotherapy treatment on 8/28. Pt is followed by [place] [facility], last seen 8/19. At that time, pt c/o nausea and decreased appetite. Pt's weight had decreased to 155 lb.  Weight now states pt weighs 170 lb. Suspect some fluid involvement.   Will order Ensure supplements given poor PO intake.  Labs reviewed. Medications: IV Decadron every 6 hours   NUTRITION - FOCUSED PHYSICAL EXAM:  Nutrition focused physical exam shows no sign of depletion of muscle mass or body fat.  Diet Order:   Diet Order            Diet regular Room service appropriate? Yes; Fluid consistency: Thin  Diet effective now              EDUCATION NEEDS:   No education needs have been identified at this time  Skin:  Skin Assessment: Reviewed RN Assessment  Last BM:  PTA  Height:   Ht Readings from Last 1 Encounters:  07/20/18 5\' 5"  (1.651 m)    Weight:   Wt Readings from Last 1 Encounters:  07/21/18 77.2 kg    Ideal Body Weight:  56.8 kg  BMI:  Body mass index is 28.32 kg/m.  Estimated Nutritional Needs:   Kcal:  2100-2300  Protein:  80-90g  Fluid:  2L/day  Clayton Bibles, MS,  RD, LDN [place] Dietitian Pager: [phone] After Hours Pager: [phone]

## 2018-07-21 NOTE — H&P (Signed)
History and Physical    [name] ZOX:[id number] DOB: 03-Dec-1955 DOA: 07/20/2018  PCP: Patient, No Pcp Per   Patient coming from: Home   Chief Complaint: Seizure  HPI: Stacey Cox is a 62 y.o. 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  Patient is following with oncology for her cancer and underwent her first chemotherapy infusion yesterday.  She reports tolerating the infusion well and was in her usual state at home today when she was observed by her sister to have generalized seizure activity lasting roughly 3 minutes.  This was followed closely by another episode of generalized seizure activity, again lasting approximately 3 minutes, and followed by a postictal period marked by mild confusion and lethargy.  She was brought into the ED and reported feeling back to her usual self upon arrival.  She denies any fevers, chills, headache, change in vision or hearing, or focal numbness or weakness.  ED Course: Upon arrival to the ED, patient is found to be afebrile, saturating well on room air, and with vitals otherwise stable.  Noncontrast head CT reveals a left frontal cortical based mass highly concerning for metastatic disease and with severe vasogenic edema.  Chemistry panel reveals a creatinine 1.02, up from 0.76 a few days earlier.  CBC features a leukocytosis to 13,900, mild macrocytic anemia, and stable thrombocytopenia.  Patient was given a gram of IV Keppra in the ED.  Oncology was consulted by the ED physician and recommended MRI brain with and without contrast, Decadron, and admission to [facility] for further evaluation and management.  Review of Systems:  All other systems reviewed and apart from HPI, are negative.  Past Medical History:  Diagnosis Date  . Cancer [facility]    Metastatic Breast Cancer    Past Surgical History:  Procedure Laterality Date  .  CHOLECYSTECTOMY    . IR IMAGING GUIDED PORT INSERTION  07/14/2018  . TONSILLECTOMY       reports that she quit smoking about 17 months ago. Her smoking use included cigarettes. She smoked 0.00 packs per day. She has never used smokeless tobacco. She reports that she does not drink alcohol or use drugs.  Allergies  Allergen Reactions  . Thorazine [Chlorpromazine] Anaphylaxis  . Ribociclib     Exfoliative dermatitis    Family History  Problem Relation Age of Onset  . Cancer Mother        breast  . Cancer Sister        breast     Prior to Admission medications   Medication Sig Start Date End Date Taking? Authorizing Provider  aspirin EC 81 MG tablet Take 1 tablet (81 mg total) by mouth daily. 08/01/17  Yes [name], MD  dexamethasone (DECADRON) 4 MG tablet Take 2 tablets by mouth once a day on the day after chemotherapy and then take 2 tablets two times a day for 2 days. Take with food. 07/12/18  Yes [name], MD  escitalopram (LEXAPRO) 20 MG tablet TAKE 1 TABLET(20 MG) BY MOUTH DAILY Patient taking differently: Take 20 mg by mouth daily as needed (anxiety).  06/19/18  Yes [name], MD  hydrOXYzine (ATARAX/VISTARIL) 10 MG tablet Take 2.5 tablets (25 mg total) by mouth 2 (two) times daily as needed for nausea or vomiting. 01/19/18  Yes [name], MD  lidocaine-prilocaine (EMLA) cream Apply to affected area once Patient taking differently: Apply  1 application topically daily as needed (pain).  07/12/18  Yes [name], MD  loperamide (IMODIUM) 1 MG/5ML solution Take 2 mg by mouth as needed for diarrhea or loose stools.    Yes [provider]  LORazepam (ATIVAN) 0.5 MG tablet Take 1 tablet (0.5 mg total) by mouth every 6 (six) hours as needed. for anxiety 03/13/18  Yes Perlov, Marinell Blight, MD  ondansetron (ZOFRAN) 4 MG tablet Take 1 tablet (4 mg total) by mouth 2 (two) times daily. Take with Verzenio. Patient taking differently:  Take 4 mg by mouth every 8 (eight) hours as needed for nausea or vomiting.  01/24/18  Yes [name], MD  potassium chloride SA (K-DUR,KLOR-CON) 20 MEQ tablet Take 1 tablet (20 mEq total) by mouth 2 (two) times daily. 07/17/18  Yes [name], MD  scopolamine (TRANSDERM-SCOP) 1 MG/3DAYS Place 1 patch (1.5 mg total) onto the skin every 3 (three) days. 07/07/18  Yes [name], MD  Vitamin D, Ergocalciferol, (DRISDOL) 50000 units CAPS capsule TAKE 1 CAPSULE BY MOUTH 3 TIMES WEEKLY 05/07/18  Yes [name], MD  abemaciclib (VERZENIO) 150 MG tablet TAKE 1 TABLET (150 MG TOTAL) BY MOUTH 2 TIMES DAILY. Patient not taking: Reported on 07/20/2018 03/21/18   [name], MD    Physical Exam: Vitals:   07/20/18 2230 07/20/18 2300 07/20/18 2330 07/21/18 0000  BP: 97/62 98/69 96/68  100/69  Pulse: 77 75 74 70  Resp: 16 16 20 16   Temp:      TempSrc:      SpO2: 96% 96% 95% 94%  Weight:      Height:          Constitutional: NAD, calm  Eyes: PERTLA, lids and conjunctivae normal ENMT: Mucous membranes are moist. Posterior pharynx clear of any exudate or lesions.   Neck: normal, supple, no masses, no thyromegaly Respiratory: clear to auscultation bilaterally, no wheezing, no crackles. Normal respiratory effort.    Cardiovascular: S1 & S2 heard, regular rate and rhythm. No extremity edema.   Abdomen: No distension, no tenderness, soft. Bowel sounds active.  Musculoskeletal: no clubbing / cyanosis. No joint deformity upper and lower extremities.    Skin: no significant rashes, lesions, ulcers. Warm, dry, well-perfused. Neurologic: CN 2-12 grossly intact. Sensation intact. Strength 5/5 in all 4 limbs.  Psychiatric: Alert and oriented x 3. Pleasant and cooperative.     Labs on Admission: I have personally reviewed following labs and imaging studies  CBC: Recent Labs  Lab 07/14/18 1257 07/17/18 1146 07/18/18 0920 07/20/18 1855  WBC 6.8 6.6 8.6 13.9*    NEUTROABS 4.9 5.2 6.9  --   HGB 11.9* 10.9* 11.4* 11.1*  HCT 35.1* 32.3* 33.8* 33.8*  MCV 98.0 98.2 97.7 101.5*  PLT 105* 81* 108* 563*   Basic Metabolic Panel: Recent Labs  Lab 07/14/18 1257 07/17/18 1146 07/18/18 0920 07/20/18 1855  NA 139 137 139 137  K 3.2* 3.0* 3.2* 5.0  CL 105 101 104 106  CO2 24 25 21* 15*  GLUCOSE 111* 203* 154* 142*  BUN 14 11 12 16   CREATININE 0.90 0.82 0.76 1.02*  CALCIUM 8.5* 8.1* 8.2* 7.5*   GFR: Estimated Creatinine Clearance: 57.6 mL/min (A) (by C-G formula based on SCr of 1.02 mg/dL (H)). Liver Function Tests: Recent Labs  Lab 07/17/18 1146 07/18/18 0920  AST 298* 340*  ALT 130* 135*  ALKPHOS 421* 387*  BILITOT 3.2* 3.8*  PROT 5.5* 5.8*  ALBUMIN 2.3* 2.4*  No results for input(s): LIPASE, AMYLASE in the last 168 hours. No results for input(s): AMMONIA in the last 168 hours. Coagulation Profile: Recent Labs  Lab 07/14/18 1257 07/18/18 0920  INR 1.45 1.56   Cardiac Enzymes: No results for input(s): CKTOTAL, CKMB, CKMBINDEX, TROPONINI in the last 168 hours. BNP (last 3 results) No results for input(s): PROBNP in the last 8760 hours. HbA1C: No results for input(s): HGBA1C in the last 72 hours. CBG: Recent Labs  Lab 07/20/18 1849  GLUCAP 135*   Lipid Profile: No results for input(s): CHOL, HDL, LDLCALC, TRIG, CHOLHDL, LDLDIRECT in the last 72 hours. Thyroid Function Tests: No results for input(s): TSH, T4TOTAL, FREET4, T3FREE, THYROIDAB in the last 72 hours. Anemia Panel: No results for input(s): VITAMINB12, FOLATE, FERRITIN, TIBC, IRON, RETICCTPCT in the last 72 hours. Urine analysis:    Component Value Date/Time   COLORURINE COLORLESS (A) 01/31/2017 1324   APPEARANCEUR CLEAR 01/31/2017 1324   LABSPEC 1.002 (L) 01/31/2017 1324   PHURINE 5.0 01/31/2017 1324   GLUCOSEU NEGATIVE 01/31/2017 1324   HGBUR NEGATIVE 01/31/2017 1324   BILIRUBINUR NEGATIVE 01/31/2017 1324   KETONESUR NEGATIVE 01/31/2017 1324   PROTEINUR  NEGATIVE 01/31/2017 1324   NITRITE NEGATIVE 01/31/2017 1324   LEUKOCYTESUR NEGATIVE 01/31/2017 1324   Sepsis Labs: @LABRCNTIP (procalcitonin:4,lacticidven:4) )No results found for this or any previous visit (from the past 240 hour(s)).   Radiological Exams on Admission: Ct Head W Or Wo Contrast  Result Date: 07/20/2018 CLINICAL DATA:  New onset witnessed seizures. History of metastatic breast cancer. EXAM: CT HEAD WITHOUT AND WITH CONTRAST TECHNIQUE: Contiguous axial images were obtained from the base of the skull through the vertex without and with intravenous contrast CONTRAST:  38mL OMNIPAQUE IOHEXOL 300 MG/ML  SOLN COMPARISON:  MRI head January 22, 2017 FINDINGS: BRAIN: Irregular cystic and solid 1.8 x 2.4 x 3.9 cm LEFT frontal cortical based mass with potential dural component. Severe surrounding vasogenic edema with mass effect on LEFT frontal horn of the lateral ventricle. 5 mm resultant LEFT to RIGHT subfalcine herniation. Stable appearance of RIGHT posterior fossa 8 x 12 mm enhancing mass most compatible with meningioma. Similar characteristic 6 mm prepontine mass. No hydrocephalus. Stable appearance of symmetric inferior cerebellar atrophy VASCULAR: Moderate calcific atherosclerosis of the carotid siphons. SKULL: No skull fracture. Sclerotic expanded LEFT parietooccipital calvarium consistent with metastatic disease. 2.3 cm sclerotic metastasis RIGHT frontal calvarium. No significant scalp soft tissue swelling. SINUSES/ORBITS: Severe frontal sinusitis. Mastoid air cells are well aerated.The included ocular globes and orbital contents are non-suspicious. OTHER: None. IMPRESSION: 1. 1.8 x 2.4 x 3.9 cm LEFT frontal cortical based mass with possible dural component highly concerning for metastatic disease, less likely abscess or cerebritis. Severe vasogenic edema with 5 mm LEFT to RIGHT subfalcine herniation. No ventricular entrapment. Recommend MRI of the brain with without contrast. 2. Stable  appearance of 8 x 12 mm RIGHT posterior fossa and 6 mm prepontine meningiomas. 3. Multiple calvarial metastasis. 4. Cerebellar atrophy. 5. Acute findings discussed with and reconfirmed by Dr.ELLIOTT WENTZ on 07/20/2018 at 9:41 pm. Electronically Signed   By: Elon Alas M.D.   On: 07/20/2018 21:42    EKG: Not performed.   Assessment/Plan   1. Brain metastasis; breast cancer  - [name] has breast cancer with liver metastases, now presenting after her first seizure and is found to have left frontal brain mass on CT that is highly concerning for metastasis  - ED physician discussed with oncology and recommendation was to obtain MRI  brain with and without contrast, start Decadron, and admit to [facility]  - MRI is ordered, Decadron will be started, and patient will be admitted to [facility]   2. Seizure  - Presents following 2 generalized seizures, back-to-back, lasting ~3 minutes each and followed by post-ictal period  - Reports feeling back to baseline on admission and denies HA, change in vision, or numbness or weakness  - Head CT reveals left frontal mass highly concerning for metastatic disease as addressed above  - She was given 1 g Keppra in ED  - Continue seizure precautions, prn Ativan   3. Mild renal insufficiency  - SCr is 1.02 on admission, slightly up from a few days earlier  - Continue gentle IVF hydration and repeat chem panel in am    4. Anemia; thrombocytopenia  - Hgb is 11.1 and platelets 107,000 on admission  - Both indices similar to prior and there is no bleeding  - Likely secondary to abemaciclib, recently discontinued by oncologist   5. Anxiety - Continue Lexapro and prn Ativan     DVT prophylaxis: SCD's  Code Status: Full  Family Communication: Sister updated at bedside Consults called: Oncology  Admission status: Inpatient     Vianne Bulls, MD [facility] Pager [phone]  If 7PM-7AM, please contact night-coverage [email or web address] Password  [email or web address]  07/21/2018, 12:40 AM

## 2018-07-21 NOTE — Care Management (Signed)
This CM was contacted by MD to alert of DC today and need for home health services. Pt was offered choice and [facility] chosen. [facility] rep alerted of referral. Pt states she has no DME needs at this time. Marney Doctor RN,BSN [phone]

## 2018-07-21 NOTE — Progress Notes (Signed)
OT Note  Patient Details Name: Stacey Cox MRN: [id number] DOB: [birth year]   Cancelled Treatment:     Spoke to PT.  Pt was min guard with OT and has 24/7 assist.  CM is setting up HHPT with planned d/c today. If pt remains in hospital, please reconsult PT.    [place] 07/21/2018, 4:24 PM

## 2018-07-21 NOTE — Progress Notes (Signed)
Patient and  Family  Verbalized understanding of scripts to pickup and follow up appointments. Patient is d/c 'd home via wheel chair with her family

## 2018-07-21 NOTE — ED Notes (Signed)
[facility] called for transport. 

## 2018-07-21 NOTE — Progress Notes (Signed)
PROGRESS NOTE    Stacey Cox  GMW:[id number] DOB: Nov 06, 1956 DOA: 07/20/2018 PCP: Patient, No Pcp Per   Brief Narrative:  Stacey Cox is a 62 y.o. 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  Patient is following with oncology for her cancer and underwent her first chemotherapy infusion on Wednesday.  She reports tolerating the infusion well and was in her usual state at home today when she was observed by her sister to have generalized seizure activity lasting roughly 3 minutes overnight.  This was followed closely by another episode of generalized seizure activity, again lasting approximately 3 minutes, and followed by a postictal period marked by mild confusion and lethargy.  She was brought into the ED and reported feeling back to her usual self upon arrival.  She denies any fevers, chills, headache, change in vision or hearing, or focal numbness or weakness.  Upon arrival to the ED, Noncontrast head CT revealed a left frontal cortical based mass highly concerning for metastatic disease and with severe vasogenic edema.   Patient was given a gram of IV Keppra in the ED. Medical Oncology was consulted by the ED physician and recommended MRI brain with and without contrast, Decadron, and admission to [facility] for further evaluation and management. Neuro-Oncology was also formally consulted and Neuro-Surgery will review films and make additional recommendations.   Assessment & Plan:   Principal Problem:   Breast cancer metastasized to brain [facility]) Active Problems:   Metastatic breast cancer ([place])   Liver metastases ([place])   Seizure ([place])   Macrocytic anemia   Thrombocytopenia ([facility])   Mild renal insufficiency   Anxiety   Brain metastases ([facility])  New Brain Metastasis in a History of Metastatic Breast Cancer  - Stacey Cox has breast cancer with liver metastases, now  presenting after her first seizure and is found to have left frontal brain mass on CT that is highly concerning for metastasis  - ED physician discussed with Medical Oncology and recommendation was to obtain MRI brain with and without contrast, start Decadron, and admit to [facility]  - MRI done and showed an Irregular plaque-like mass, likely metastasis, measuring up to 5.1 cm, appears to originate from the left anterolateral frontal dura with invasion of the underlying frontal lobe of the brain. Mass effect from brain edema and the lesion partially effaces the frontal horn of left lateral ventricle and results in 7 mm left-to-right midline shift of anterior septum pellucidum. Diffuse dural thickening over the left cerebral convexity may represent edema associated with the tumor or invasive neoplasm. Multiple sclerotic bony metastasis of the calvarium. - Patient was started on IV dexamethasone 4 mg every 6 scheduled and will start po Pantoprazole 40 mg po Daily -Have consulted neuro oncology Dr. Cecil Cobbs for further evaluation recommendations -Have also discussed the case with Dr. Vertell Limber of neurosurgery who will review films and make recommendations after talking with Dr. Mickeal Skinner  Seizure  - Presents following 2 generalized seizures, back-to-back, lasting ~3 minutes each and followed by post-ictal period  - Reports feeling back to baseline on admission and denies HA, change in vision, or numbness or weakness  - Head CT reveals left frontal mass highly concerning for metastatic disease as addressed above  - She was given 1 g Keppra in ED  and was started on IV Keppra 750 every 12 at the recommendation of Dr. Mickeal Skinner - Continue seizure precautions, prn  Ativan 1 mg IV q6hprn for Seizure   Mild renal Insufficiency/ Mild AKI - SCr is 1.02 on admission, slightly up from a few days earlier; Repeat not done this AM so have re-ordered - Avoid Nephrotoxic Medications if possible  - Continue gentle IVF  hydration and repeat chem panel in am    Anemia; Thrombocytopenia  - Hgb is 11.1 and platelets 107,000 on admission; Repeat is pending  - Both indices similar to prior and there is no bleeding  - Likely secondary to Abemaciclib, recently discontinued by oncologist - Recently Had Chemotherapy last week as well   Anxiety - Continue Escitalopram 20 mg p.o. daily and prn Ativan 0.5 mg po q6hprn Anxiety/Sleep  Abnormal LFT's and Hyperbilirubinemia -likely from Metastasis; Confirmed by recent Liver Biopsy on 07/18/18 -Patient's AST was 275, ALT was 135, and T Bili was 4.0 -Continue to Monitor and Trend -Repeat CMP in AM   Leukocytosis -Likely in the setting of seizure -Continue monitor for signs and symptoms of infection -Expect patient's WBC to elevate in the setting of IV steroid demargination -Repeat CBC in a.m.  DVT prophylaxis: SCDs Code Status: FULL CODE Family Communication: Discussed case with Family at bedside  Disposition Plan: Remain Inpatient for further evaluation and work-up   Consultants:   Medical Oncology Dr. Irene Limbo- Will see the patient when he is back in town  Neuro-Oncology Dr. Mickeal Skinner  Discussed Case with Neuro-Surgery Dr. Vertell Limber who will review the case    Procedures:  None   Antimicrobials:  Anti-infectives (From admission, onward)   None     Subjective: Seen and examined at bedside and states that she is feeling better and is alert and oriented now.  No chest pain, shortness breath, nausea, vomiting.  Denied any complaints or concerns and was wanting to know when she can go home.  Objective: Vitals:   07/20/18 2330 07/21/18 0000 07/21/18 0150 07/21/18 0202  BP: 96/68 100/69  92/64  Pulse: 74 70  76  Resp: 20 16  16   Temp:    98.5 F (36.9 C)  TempSrc:    Oral  SpO2: 95% 94%  98%  Weight:   77.2 kg   Height:        Intake/Output Summary (Last 24 hours) at 07/21/2018 1458 Last data filed at 07/21/2018 1050 Gross per 24 hour  Intake 385.9 ml   Output -  Net 385.9 ml   Filed Weights   07/20/18 1839 07/21/18 0150  Weight: 73.9 kg 77.2 kg   Examination: Physical Exam:  Constitutional: WN/WD overweight AAF in NAD and appears calm and comfortable Eyes: Lids and conjunctivae normal, sclerae anicteric  ENMT: External Ears, Nose appear normal. Grossly normal hearing.  Neck: Appears normal, supple, no cervical masses, normal ROM, no appreciable thyromegaly, no JVD Respiratory: Diminished to auscultation bilaterally, no wheezing, rales, rhonchi or crackles. Normal respiratory effort and patient is not tachypenic. No accessory muscle use.  Cardiovascular: RRR, no murmurs / rubs / gallops. S1 and S2 auscultated.  Abdomen: Soft, non-tender, non-distended. No masses palpated. No appreciable hepatosplenomegaly. Bowel sounds positive x4.  GU: Deferred. Musculoskeletal: No clubbing / cyanosis of digits/nails. Good ROM, no contractures. Normal strength and muscle tone.  Skin: No rashes, lesions, ulcers on a limited skin evaluation. No induration; Warm and dry.  Neurologic: CN 2-12 grossly intact with no focal deficits. Romberg sign and cerebellar reflexes not assessed.  Psychiatric: Normal judgment and insight. Alert and oriented x 3. Anxious and a little withdrawn.   Data Reviewed:  I have personally reviewed following labs and imaging studies  CBC: Recent Labs  Lab 07/17/18 1146 07/18/18 0920 07/20/18 1855  WBC 6.6 8.6 13.9*  NEUTROABS 5.2 6.9  --   HGB 10.9* 11.4* 11.1*  HCT 32.3* 33.8* 33.8*  MCV 98.2 97.7 101.5*  PLT 81* 108* 601*   Basic Metabolic Panel: Recent Labs  Lab 07/17/18 1146 07/18/18 0920 07/20/18 1855  NA 137 139 137  K 3.0* 3.2* 5.0  CL 101 104 106  CO2 25 21* 15*  GLUCOSE 203* 154* 142*  BUN 11 12 16   CREATININE 0.82 0.76 1.02*  CALCIUM 8.1* 8.2* 7.5*   GFR: Estimated Creatinine Clearance: 58.8 mL/min (A) (by C-G formula based on SCr of 1.02 mg/dL (H)). Liver Function Tests: Recent Labs  Lab  07/17/18 1146 07/18/18 0920 07/21/18 0917  AST 298* 340* 275*  ALT 130* 135* 135*  ALKPHOS 421* 387* 367*  BILITOT 3.2* 3.8* 4.0*  PROT 5.5* 5.8* 5.1*  ALBUMIN 2.3* 2.4* 2.1*   No results for input(s): LIPASE, AMYLASE in the last 168 hours. No results for input(s): AMMONIA in the last 168 hours. Coagulation Profile: Recent Labs  Lab 07/18/18 0920  INR 1.56   Cardiac Enzymes: No results for input(s): CKTOTAL, CKMB, CKMBINDEX, TROPONINI in the last 168 hours. BNP (last 3 results) No results for input(s): PROBNP in the last 8760 hours. HbA1C: No results for input(s): HGBA1C in the last 72 hours. CBG: Recent Labs  Lab 07/20/18 1849 07/21/18 0718  GLUCAP 135* 101*   Lipid Profile: No results for input(s): CHOL, HDL, LDLCALC, TRIG, CHOLHDL, LDLDIRECT in the last 72 hours. Thyroid Function Tests: No results for input(s): TSH, T4TOTAL, FREET4, T3FREE, THYROIDAB in the last 72 hours. Anemia Panel: No results for input(s): VITAMINB12, FOLATE, FERRITIN, TIBC, IRON, RETICCTPCT in the last 72 hours. Sepsis Labs: No results for input(s): PROCALCITON, LATICACIDVEN in the last 168 hours.  No results found for this or any previous visit (from the past 240 hour(s)).   Radiology Studies: Ct Head W Or Wo Contrast  Result Date: 07/20/2018 CLINICAL DATA:  New onset witnessed seizures. History of metastatic breast cancer. EXAM: CT HEAD WITHOUT AND WITH CONTRAST TECHNIQUE: Contiguous axial images were obtained from the base of the skull through the vertex without and with intravenous contrast CONTRAST:  73mL OMNIPAQUE IOHEXOL 300 MG/ML  SOLN COMPARISON:  MRI head January 22, 2017 FINDINGS: BRAIN: Irregular cystic and solid 1.8 x 2.4 x 3.9 cm LEFT frontal cortical based mass with potential dural component. Severe surrounding vasogenic edema with mass effect on LEFT frontal horn of the lateral ventricle. 5 mm resultant LEFT to RIGHT subfalcine herniation. Stable appearance of RIGHT posterior fossa 8  x 12 mm enhancing mass most compatible with meningioma. Similar characteristic 6 mm prepontine mass. No hydrocephalus. Stable appearance of symmetric inferior cerebellar atrophy VASCULAR: Moderate calcific atherosclerosis of the carotid siphons. SKULL: No skull fracture. Sclerotic expanded LEFT parietooccipital calvarium consistent with metastatic disease. 2.3 cm sclerotic metastasis RIGHT frontal calvarium. No significant scalp soft tissue swelling. SINUSES/ORBITS: Severe frontal sinusitis. Mastoid air cells are well aerated.The included ocular globes and orbital contents are non-suspicious. OTHER: None. IMPRESSION: 1. 1.8 x 2.4 x 3.9 cm LEFT frontal cortical based mass with possible dural component highly concerning for metastatic disease, less likely abscess or cerebritis. Severe vasogenic edema with 5 mm LEFT to RIGHT subfalcine herniation. No ventricular entrapment. Recommend MRI of the brain with without contrast. 2. 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 3. Multiple calvarial metastasis. 4. Cerebellar atrophy. 5. Acute findings discussed with and reconfirmed by Dr.ELLIOTT WENTZ on 07/20/2018 at 9:41 pm. Electronically Signed   By: Elon Alas M.D.   On: 07/20/2018 21:42   Mr Jeri Cos MB Contrast  Result Date: 07/21/2018 CLINICAL DATA:  62 y/o F; metastatic breast cancer presenting with seizure. EXAM: MRI HEAD WITHOUT AND WITH CONTRAST TECHNIQUE: Multiplanar, multiecho pulse sequences of the brain and surrounding structures were obtained without and with intravenous contrast. CONTRAST:  38mL MULTIHANCE GADOBENATE DIMEGLUMINE 529 MG/ML IV SOLN COMPARISON:  07/20/2018 CT head FINDINGS: Brain: Plaque-like irregular enhancing mass with broad base to the dura measuring 5.1 x 2.2 x 2.4 cm. The mass likely originates from the dura with invasion of the underlying left frontal lobe. There is diffuse smooth dural thickening over the left cerebral convexity which may  represent associated edema and/or invasive neoplasm. There is T2 FLAIR signal abnormality compatible with edema throughout the surrounding left frontal lobe extending into the anterior insula. Mass effect from the lesion and brain edema partially effaces the frontal horn of left lateral ventricle and results in 7 mm of left-to-right midline shift of the anterior septum pellucidum. No herniation. Asymmetric cerebellar volume loss. No reduced diffusion to suggest acute or early subacute infarction. No additional focus of abnormal enhancement of the brain. No abnormal susceptibility hypointensity to indicate hemorrhage. Vascular: Normal flow voids. Skull and upper cervical spine: There is confluent low signal in bone marrow throughout the left frontal bone, occipital bone, right temporal squamous likely representing osseous metastatic disease. Sinuses/Orbits: Negative. Other: None. IMPRESSION: 1. Irregular plaque-like mass, likely metastasis, measuring up to 5.1 cm, appears to originate from the left anterolateral frontal dura with invasion of the underlying frontal lobe of the brain. Mass effect from brain edema and the lesion partially effaces the frontal horn of left lateral ventricle and results in 7 mm left-to-right midline shift of anterior septum pellucidum. 2. Diffuse dural thickening over the left cerebral convexity may represent edema associated with the tumor or invasive neoplasm. 3. 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 This may be due to alcoholic cerebellar degeneration, phenytoin/lithium toxicity, chronic sequelae of cerebellitis, associated with an inherited ataxia syndrome, or represent a neurodegenerate disorder such as multi-system atrophy or olivopontocerebellar atrophy. Electronically Signed   By: Kristine Garbe M.D.   On: 07/21/2018 01:14   Scheduled Meds: . dexamethasone  4 mg Intravenous Q6H  . escitalopram  20 mg Oral Daily  .  feeding supplement (ENSURE ENLIVE)  237 mL Oral BID BM  . pantoprazole  40 mg Oral Daily  . scopolamine  1 patch Transdermal Q72H   Continuous Infusions: . levETIRAcetam 750 mg (07/21/18 1316)    LOS: 1 day   Kerney Elbe, DO [facility] PAGER is on [place]  If 7PM-7AM, please contact night-coverage [email or web address] Password [email or web address] 07/21/2018, 2:58 PM

## 2018-07-21 NOTE — Discharge Summary (Signed)
Physician Discharge Summary  Stacey Cox HAL:[id number] DOB: Nov 18, 1956 DOA: 07/20/2018  PCP: Patient, No Pcp Per  Admit date: 07/20/2018 Discharge date: 07/21/2018  Admitted From: Home Disposition:  Home and will arrange Home Health PT short Term  Recommendations for Outpatient Follow-up:  1. Follow up with PCP in 1-2 weeks 2. Follow up with Medical Oncology Dr. Irene Limbo as an outpatient 3. Follow up with Neuro-Oncology Dr. Mickeal Skinner as an outpatient 4. Repeat MRI on a 3T Scanner after 3-5 Days of Steroids and Follow up with Radiation Oncology as an outpatient for radiation evaluation  5. Follow up with Neuro-Surgery Dr. Vertell Limber if indicated based on the recommendations of Neuro-Oncology  6. Please obtain CMP/CBC, Mag, Phos in one week 7. Please follow up on the following pending results:  Home Health: Yes  Equipment/Devices: None   Discharge Condition: Stable CODE STATUS: FULL CODE Diet recommendation: Regular Diet   Brief/Interim Summary: Stacey Brockis a 62 y.o.femalewith medical history significant foranxiety and breast cancer metastatic to liver, now presenting to the emergency department after seizure at home. Patient is following with oncology for her cancer and underwent her first chemotherapy infusion on Wednesday. She reports tolerating the infusion well and was in her usual state at home today when she was observed by her sister to have generalized seizure activity lasting roughly 3 minutes overnight. This was followed closely by another episode of generalized seizure activity, again lasting approximately 3 minutes, and followed by a postictal period marked by mild confusion and lethargy. She was brought into the ED and reported feeling back to her usual self upon arrival. She denies any fevers, chills, headache, change in vision or hearing, or focal numbness or weakness.  Upon arrival to the ED, Noncontrast head CT revealed a left frontal cortical based mass highly  concerning for metastatic disease and with severe vasogenic edema. Patient was given a gram of IV Keppra in the ED. Medical Oncology was consulted by the ED physician and recommended MRI brain with and without contrast, Decadron, and admission to [facility] for further evaluation and management. Neuro-Oncology was also formally consulted and recommended D/C with outpatient follow up with Radiation Oncology and Neuro Oncology as well as continuing po Dexamethasone and po Keppra along with an outpatient MRI on 3T Scanner. Case was discussed with Neuro-Surgery Dr. Vertell Limber as well who reviewed the films and unfortunately the patient is not likely a candidate for resection at this time. She was deemed medically stable to be D/C'd Home and will need to to follow up with the above providers mentioned.   Discharge Diagnoses:  Principal Problem:   Breast cancer metastasized to brain [facility]) Active Problems:   Metastatic breast cancer ([place])   Liver metastases ([place])   Seizure ([place])   Macrocytic anemia   Thrombocytopenia ([facility])   Mild renal insufficiency   Anxiety   Brain metastases ([facility])  New Brain Metastasis in a History of Metastatic Breast Cancer -Stacey Cox has breast cancer with liver metastases, now presenting after her first seizure and is found to have left frontal brain mass on CT that is highly concerning for metastasis -ED physician discussed with Medical Oncology and recommendation was to obtain MRI brain with and without contrast, start Decadron, and admit to [facility] -MRI done and showed an Irregular plaque-like mass, likely metastasis, measuring up to 5.1 cm, appears to originate from the left anterolateral frontal dura with invasion of the underlying frontal lobe of the brain. Mass effect from brain edema and the lesion partially  effaces the frontal horn of left lateral ventricle and results in 7 mm left-to-right midline shift of anterior septum pellucidum. Diffuse dural thickening over  the left cerebral convexity may represent edema associated with the tumor or invasive neoplasm. Multiple sclerotic bony metastasis of the calvarium. - Patient was started on IV dexamethasone 4 mg every 6 scheduled and will start po Pantoprazole 40 mg po Daily -Have consulted neuro oncology Dr. Cecil Cobbs for further evaluation recommendations who recommends Keppra 750 mg po BID and Decadron 4 mg po Daily; Also recommends repeating an MRI on a 3T scanner 3-5 days after being on steroids for better characterization and for planning of radiation as an outpatient  -Have also discussed the case with Dr. Vertell Limber of neurosurgery who will review films and make recommendations after talking with Dr. Mickeal Skinner and patient is not likely a candidate for resection at this time. -OT evaluated and recommended Supervision/Assistance 24 hours and will set up [place] [facility] short term -Follow up with Oncology Dr. Irene Limbo   Seizure -Presents following 2 generalized seizures, back-to-back, lasting ~3 minutes each and followed by post-ictal period -Reports feeling back to baseline on admission and denies HA, change in vision, or numbness or weakness -Head CT reveals left frontal mass highly concerning for metastatic disease as addressed above -She was given 1 g Keppra in ED and was started on IV Keppra 750 every 12 at the recommendation of Dr. Mickeal Skinner and changed to po Keppra 750 mg po BID  -Continue seizure precautions, prn Ativan1 mg IV q6hprn for Seizure while hospitalzied   Mild renal Insufficiency/ Mild AKI, improved  -SCr is 1.02 on admission, slightly up from a few days earlier; Repeat not done this AM so have re-ordered and AKI improved as BUN/Cr was now 20/0.72 - Avoid Nephrotoxic Medications if possible  -Continued gentle IVF hydration while hospitalized and repeat chem panel in am as an outpatient   Anemia; Thrombocytopenia -Hgb is 11.1 and platelets 107,000 on admission; Repeat is pending   - Hb/Hct is 10.4/30.3 on Repeat  -Both indices similar to prior and there is no bleeding -Likely secondary to Abemaciclib, recently discontinued by oncologist - Recently Had Chemotherapy last week as welland will need to follow up closely with Medical Oncology   Anxiety -Continue Escitalopram 20 mg p.o. daily and prn Ativan0.5 mg po q6hprn Anxiety/Sleep  Abnormal LFT's and Hyperbilirubinemia -likely from Metastasis; Confirmed by recent Liver Biopsy on 07/18/18 -Patient's AST was 275, ALT was 135, and T Bili was 4.0 -Continue to Monitor and Trend -Repeat CMP as an outpatient   Leukocytosis, improved -Likely in the setting of seizure and now improved to 10.3 -Continue monitor for signs and symptoms of infection -Expect patient's WBC to elevate in the setting of IV steroid demargination -Repeat CBC in a.m.  Discharge Instructions  Discharge Instructions    Call MD for:  difficulty breathing, headache or visual disturbances   Complete by:  As directed    Call MD for:  extreme fatigue   Complete by:  As directed    Call MD for:  hives   Complete by:  As directed    Call MD for:  persistant dizziness or light-headedness   Complete by:  As directed    Call MD for:  persistant nausea and vomiting   Complete by:  As directed    Call MD for:  redness, tenderness, or signs of infection (pain, swelling, redness, odor or green/yellow discharge around incision site)   Complete by:  As  directed    Call MD for:  severe uncontrolled pain   Complete by:  As directed    Call MD for:  temperature >100.4   Complete by:  As directed    Diet - low sodium heart healthy   Complete by:  As directed    Discharge instructions   Complete by:  As directed    You were cared for by a hospitalist during your hospital stay. If you have any questions about your discharge medications or the care you received while you were in the hospital after you are discharged, you can call the unit and ask to  speak with the hospitalist on call if the hospitalist that took care of you is not available. Once you are discharged, your primary care physician will handle any further medical issues. Please note that NO REFILLS for any discharge medications will be authorized once you are discharged, as it is imperative that you return to your primary care physician (or establish a relationship with a primary care physician if you do not have one) for your aftercare needs so that they can reassess your need for medications and monitor your lab values.  Follow up with PCP and Neuro-Oncology, Radiation Oncology, and Medical Oncology. Take all medications as prescribed. If symptoms change or worsen please return to the ED for evaluation   Increase activity slowly   Complete by:  As directed      Allergies as of 07/21/2018      Reactions   Thorazine [chlorpromazine] Anaphylaxis   Ribociclib    Exfoliative dermatitis      Medication List    STOP taking these medications   abemaciclib 150 MG tablet Commonly known as:  VERZENIO     TAKE these medications   aspirin EC 81 MG tablet Take 1 tablet (81 mg total) by mouth daily.   dexamethasone 4 MG tablet Commonly known as:  DECADRON Take 1 tablet (4 mg total) by mouth daily. Start taking on:  07/22/2018 What changed:    how much to take  how to take this  when to take this  additional instructions   escitalopram 20 MG tablet Commonly known as:  LEXAPRO TAKE 1 TABLET(20 MG) BY MOUTH DAILY What changed:  See the new instructions.   feeding supplement (ENSURE ENLIVE) Liqd Take 237 mLs by mouth 2 (two) times daily between meals. Start taking on:  07/22/2018   hydrOXYzine 10 MG tablet Commonly known as:  ATARAX/VISTARIL Take 2.5 tablets (25 mg total) by mouth 2 (two) times daily as needed for nausea or vomiting.   levETIRAcetam 750 MG tablet Commonly known as:  KEPPRA Take 1 tablet (750 mg total) by mouth 2 (two) times daily.    lidocaine-prilocaine cream Commonly known as:  EMLA Apply to affected area once What changed:    how much to take  how to take this  when to take this  reasons to take this  additional instructions   loperamide 1 MG/5ML solution Commonly known as:  IMODIUM Take 2 mg by mouth as needed for diarrhea or loose stools.   LORazepam 0.5 MG tablet Commonly known as:  ATIVAN Take 1 tablet (0.5 mg total) by mouth every 6 (six) hours as needed. for anxiety   ondansetron 4 MG tablet Commonly known as:  ZOFRAN Take 1 tablet (4 mg total) by mouth 2 (two) times daily. Take with Verzenio. What changed:    when to take this  reasons to take this  additional instructions  pantoprazole 40 MG tablet Commonly known as:  PROTONIX Take 1 tablet (40 mg total) by mouth daily. Start taking on:  07/22/2018   potassium chloride SA 20 MEQ tablet Commonly known as:  K-DUR,KLOR-CON Take 1 tablet (20 mEq total) by mouth 2 (two) times daily.   scopolamine 1 MG/3DAYS Commonly known as:  TRANSDERM-SCOP Place 1 patch (1.5 mg total) onto the skin every 3 (three) days.   Vitamin D (Ergocalciferol) 50000 units Caps capsule Commonly known as:  DRISDOL TAKE 1 CAPSULE BY MOUTH 3 TIMES WEEKLY      Follow-up Information    [facility].   Specialty:  Home Health Services Why:  For home health physical therapy Contact information: [place] [phone]          Allergies  Allergen Reactions  . Thorazine [Chlorpromazine] Anaphylaxis  . Ribociclib     Exfoliative dermatitis   Consultations:  Medical Oncology Dr. Irene Limbo- Will see the patient when he is back in town as an outpatient   Neuro-Oncology Dr. Mickeal Skinner  Discussed Case with Neuro-Surgery Dr. Vertell Limber who will review the case and recommends that the patient is not a surgical candidate at this time  Procedures/Studies: Ct Head W Or Wo Contrast  Result Date:  07/20/2018 CLINICAL DATA:  New onset witnessed seizures. History of metastatic breast cancer. EXAM: CT HEAD WITHOUT AND WITH CONTRAST TECHNIQUE: Contiguous axial images were obtained from the base of the skull through the vertex without and with intravenous contrast CONTRAST:  63mL OMNIPAQUE IOHEXOL 300 MG/ML  SOLN COMPARISON:  MRI head January 22, 2017 FINDINGS: BRAIN: Irregular cystic and solid 1.8 x 2.4 x 3.9 cm LEFT frontal cortical based mass with potential dural component. Severe surrounding vasogenic edema with mass effect on LEFT frontal horn of the lateral ventricle. 5 mm resultant LEFT to RIGHT subfalcine herniation. Stable appearance of RIGHT posterior fossa 8 x 12 mm enhancing mass most compatible with meningioma. Similar characteristic 6 mm prepontine mass. No hydrocephalus. Stable appearance of symmetric inferior cerebellar atrophy VASCULAR: Moderate calcific atherosclerosis of the carotid siphons. SKULL: No skull fracture. Sclerotic expanded LEFT parietooccipital calvarium consistent with metastatic disease. 2.3 cm sclerotic metastasis RIGHT frontal calvarium. No significant scalp soft tissue swelling. SINUSES/ORBITS: Severe frontal sinusitis. Mastoid air cells are well aerated.The included ocular globes and orbital contents are non-suspicious. OTHER: None. IMPRESSION: 1. 1.8 x 2.4 x 3.9 cm LEFT frontal cortical based mass with possible dural component highly concerning for metastatic disease, less likely abscess or cerebritis. Severe vasogenic edema with 5 mm LEFT to RIGHT subfalcine herniation. No ventricular entrapment. Recommend MRI of the brain with without contrast. 2. 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 3. Multiple calvarial metastasis. 4. Cerebellar atrophy. 5. Acute findings discussed with and reconfirmed by Dr.ELLIOTT WENTZ on 07/20/2018 at 9:41 pm. Electronically Signed   By: Elon Alas M.D.   On: 07/20/2018 21:42   Ct Chest W  Contrast  Result Date: 07/06/2018 CLINICAL DATA:  Metastatic breast carcinoma. Recent episodes of vomiting. Some weight loss. EXAM: CT CHEST, ABDOMEN, AND PELVIS WITH CONTRAST TECHNIQUE: Multidetector CT imaging of the chest, abdomen and pelvis was performed following the standard protocol during bolus administration of intravenous contrast. CONTRAST:  122mL OMNIPAQUE IOHEXOL 300 MG/ML  SOLN COMPARISON:  CT 02/22/2018 FINDINGS: CT CHEST FINDINGS Cardiovascular: No significant vascular findings. Normal heart size. No pericardial effusion. Mediastinum/Nodes: Axillary supraclavicular adenopathy. No mediastinal hilar adenopathy. No  pericardial effusion. Esophagus normal. Lungs/Pleura: No suspicious pulmonary nodules Musculoskeletal: Multiple sclerotic lesions spine and ribs unchanged CT ABDOMEN AND PELVIS FINDINGS Hepatobiliary: There is new infiltrative pattern of enhancing lesions involving large portion of the central liver (segment 5, segment 8, 4A, and 4 B). This is in a region of a previously enhancing lesion in the RIGHT hepatic lobe which is now difficult define on the background this new infiltrative process. There are several discrete lesions including a 2.5 cm peripheral enhancing lesion in the central RIGHT hepatic lobe (image 47/2). More peripheral subcapsular lesions in the dome of liver with irregular peripheral enhancement measured 2.1 x 0.8 cm on image 42/2 and 1.6 by 2.3 cm on image 38/2. The infiltrative mass like pattern involves broad segment measuring 18 x 8 cm on image 52/2 for example. Very small amount of fluid along the RIGHT hepatic lobe and paraspinal liver no biliary duct dilatation. Portal veins patent. Postcholecystectomy. Pancreas: Pancreas is normal. No ductal dilatation. No pancreatic inflammation. Spleen: Small low-density lesions spleen is favored benign cysts. Adrenals/urinary tract: Adrenal glands, kidneys, ureters and bladder normal. Stomach/Bowel: Stomach, small bowel, appendix,  and cecum are normal. The colon and rectosigmoid colon are normal. Vascular/Lymphatic: Abdominal aorta is normal caliber. There is no retroperitoneal or periportal lymphadenopathy. No pelvic lymphadenopathy. Reproductive: Uterus normal. Other: Small free fluid the pelvis. Musculoskeletal: Multiple densely sclerotic lesions in the pelvis and spine are not changed from comparison exam IMPRESSION: Chest Impression: 1. No evidence pulmonary metastasis or lymphadenopathy. 2. Stable dense sclerotic skeletal metastasis. Abdomen / Pelvis Impression: 1. New pattern of heterogeneous enhancement and enhancing ill-defined lesions in the central LEFT and RIGHT hepatic lobe at site prior enhancing lesion is highly concerning for progression of of infiltrative malignancy in the liver occupying a large portion of the RIGHT hepatic lobe and central LEFT hepatic lobe. 2. Small amount of free fluid in the abdomen pelvis. 3. No metastatic adenopathy. 4. Stable  skeletal sclerotic metastasis. These results will be called to the ordering clinician or representative by the Radiologist Assistant, and communication documented in the PACS or zVision Dashboard. Electronically Signed   By: Suzy Bouchard M.D.   On: 07/06/2018 14:37   Mr Jeri Cos OZ Contrast  Result Date: 07/21/2018 CLINICAL DATA:  62 y/o F; metastatic breast cancer presenting with seizure. EXAM: MRI HEAD WITHOUT AND WITH CONTRAST TECHNIQUE: Multiplanar, multiecho pulse sequences of the brain and surrounding structures were obtained without and with intravenous contrast. CONTRAST:  22mL MULTIHANCE GADOBENATE DIMEGLUMINE 529 MG/ML IV SOLN COMPARISON:  07/20/2018 CT head FINDINGS: Brain: Plaque-like irregular enhancing mass with broad base to the dura measuring 5.1 x 2.2 x 2.4 cm. The mass likely originates from the dura with invasion of the underlying left frontal lobe. There is diffuse smooth dural thickening over the left cerebral convexity which may represent associated  edema and/or invasive neoplasm. There is T2 FLAIR signal abnormality compatible with edema throughout the surrounding left frontal lobe extending into the anterior insula. Mass effect from the lesion and brain edema partially effaces the frontal horn of left lateral ventricle and results in 7 mm of left-to-right midline shift of the anterior septum pellucidum. No herniation. Asymmetric cerebellar volume loss. No reduced diffusion to suggest acute or early subacute infarction. No additional focus of abnormal enhancement of the brain. No abnormal susceptibility hypointensity to indicate hemorrhage. Vascular: Normal flow voids. Skull and upper cervical spine: There is confluent low signal in bone marrow throughout the left frontal bone, occipital bone, right temporal  squamous likely representing osseous metastatic disease. Sinuses/Orbits: Negative. Other: None. IMPRESSION: 1. Irregular plaque-like mass, likely metastasis, measuring up to 5.1 cm, appears to originate from the left anterolateral frontal dura with invasion of the underlying frontal lobe of the brain. Mass effect from brain edema and the lesion partially effaces the frontal horn of left lateral ventricle and results in 7 mm left-to-right midline shift of anterior septum pellucidum. 2. Diffuse dural thickening over the left cerebral convexity may represent edema associated with the tumor or invasive neoplasm. 3. 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 This may be due to alcoholic cerebellar degeneration, phenytoin/lithium toxicity, chronic sequelae of cerebellitis, associated with an inherited ataxia syndrome, or represent a neurodegenerate disorder such as multi-system atrophy or olivopontocerebellar atrophy. Electronically Signed   By: Kristine Garbe M.D.   On: 07/21/2018 01:14   Ct Abdomen Pelvis W Contrast  Result Date: 07/06/2018 CLINICAL DATA:  Metastatic breast carcinoma. Recent episodes of  vomiting. Some weight loss. EXAM: CT CHEST, ABDOMEN, AND PELVIS WITH CONTRAST TECHNIQUE: Multidetector CT imaging of the chest, abdomen and pelvis was performed following the standard protocol during bolus administration of intravenous contrast. CONTRAST:  178mL OMNIPAQUE IOHEXOL 300 MG/ML  SOLN COMPARISON:  CT 02/22/2018 FINDINGS: CT CHEST FINDINGS Cardiovascular: No significant vascular findings. Normal heart size. No pericardial effusion. Mediastinum/Nodes: Axillary supraclavicular adenopathy. No mediastinal hilar adenopathy. No pericardial effusion. Esophagus normal. Lungs/Pleura: No suspicious pulmonary nodules Musculoskeletal: Multiple sclerotic lesions spine and ribs unchanged CT ABDOMEN AND PELVIS FINDINGS Hepatobiliary: There is new infiltrative pattern of enhancing lesions involving large portion of the central liver (segment 5, segment 8, 4A, and 4 B). This is in a region of a previously enhancing lesion in the RIGHT hepatic lobe which is now difficult define on the background this new infiltrative process. There are several discrete lesions including a 2.5 cm peripheral enhancing lesion in the central RIGHT hepatic lobe (image 47/2). More peripheral subcapsular lesions in the dome of liver with irregular peripheral enhancement measured 2.1 x 0.8 cm on image 42/2 and 1.6 by 2.3 cm on image 38/2. The infiltrative mass like pattern involves broad segment measuring 18 x 8 cm on image 52/2 for example. Very small amount of fluid along the RIGHT hepatic lobe and paraspinal liver no biliary duct dilatation. Portal veins patent. Postcholecystectomy. Pancreas: Pancreas is normal. No ductal dilatation. No pancreatic inflammation. Spleen: Small low-density lesions spleen is favored benign cysts. Adrenals/urinary tract: Adrenal glands, kidneys, ureters and bladder normal. Stomach/Bowel: Stomach, small bowel, appendix, and cecum are normal. The colon and rectosigmoid colon are normal. Vascular/Lymphatic: Abdominal  aorta is normal caliber. There is no retroperitoneal or periportal lymphadenopathy. No pelvic lymphadenopathy. Reproductive: Uterus normal. Other: Small free fluid the pelvis. Musculoskeletal: Multiple densely sclerotic lesions in the pelvis and spine are not changed from comparison exam IMPRESSION: Chest Impression: 1. No evidence pulmonary metastasis or lymphadenopathy. 2. Stable dense sclerotic skeletal metastasis. Abdomen / Pelvis Impression: 1. New pattern of heterogeneous enhancement and enhancing ill-defined lesions in the central LEFT and RIGHT hepatic lobe at site prior enhancing lesion is highly concerning for progression of of infiltrative malignancy in the liver occupying a large portion of the RIGHT hepatic lobe and central LEFT hepatic lobe. 2. Small amount of free fluid in the abdomen pelvis. 3. No metastatic adenopathy. 4. Stable  skeletal sclerotic metastasis. These results will be called to the ordering clinician or representative by the Radiologist Assistant, and communication documented in the PACS or zVision Dashboard. Electronically  Signed   By: Suzy Bouchard M.D.   On: 07/06/2018 14:37   US Abdomen Limited  Result Date: 07/18/2018 CLINICAL DATA:  Liver lesion. Trace ascites noted on prior CT. Paracentesis requested in preparation for percutaneous liver lesion biopsy. EXAM: LIMITED ABDOMEN ULTRASOUND FOR ASCITES TECHNIQUE: Limited ultrasound survey for ascites was performed in all four abdominal quadrants. COMPARISON:  CT 07/06/2018 FINDINGS: Trace abdominal ascites. No adequate pocket for safe paracentesis, which was deferred. IMPRESSION: Trace abdominal ascites.  Paracentesis deferred. Electronically Signed   By: Lucrezia Europe M.D.   On: 07/18/2018 12:38   US Biopsy (liver)  Result Date: 07/18/2018 CLINICAL DATA:  Multiple liver lesions. History of breast carcinoma. EXAM: ULTRASOUND-GUIDED CORE LIVER BIOPSY TECHNIQUE: An ultrasound guided liver biopsy was thoroughly discussed with the  patient and questions were answered. The benefits, risks, alternatives, and complications were also discussed. The patient understands and wishes to proceed with the procedure. A verbal as well as written consent was obtained. Survey ultrasound of the liver was performed, a representative lesion localized in the right lobe, and an appropriate skin entry site was determined. Skin site was marked, prepped with chlorhexidine, and draped in usual sterile fashion, and infiltrated locally with 1% lidocaine. Intravenous Fentanyl and Versed were administered as conscious sedation during continuous monitoring of the patient's level of consciousness and physiological / cardiorespiratory status by the radiology RN, with a total moderate sedation time of 10 minutes. A 17 gauge trocar needle was advanced under ultrasound guidance into the liver to the margin of the lesion. 3 coaxial 18gauge core samples were then obtained through the guide needle. The guide needle was removed. Post procedure scans demonstrate no apparent complication. COMPLICATIONS: COMPLICATIONS None immediate FINDINGS: Multiple low-attenuation liver lesions were localized. Representative core biopsy samples obtained as above. IMPRESSION: 1. Technically successful ultrasound guided core liver lesion biopsy. Electronically Signed   By: Lucrezia Europe M.D.   On: 07/18/2018 16:53   [facility] Guided Port Insertion  Result Date: 07/14/2018 CLINICAL DATA:  Metastatic breast cancer EXAM: RIGHT INTERNAL JUGULAR SINGLE LUMEN POWER PORT CATHETER INSERTION Date:  07/14/2018 07/14/2018 4:04 pm Radiologist:  Jerilynn Mages. Daryll Brod, MD Guidance:  Ultrasound fluoroscopic MEDICATIONS: Ancef 2 g; The antibiotic was administered within an appropriate time interval prior to skin puncture. ANESTHESIA/SEDATION: Versed 3.0 mg IV; Fentanyl 100 mcg IV; Moderate Sedation Time:  23 minutes The patient was continuously monitored during the procedure by the interventional radiology nurse under my  direct supervision. FLUOROSCOPY TIME:  0 minutes, 36 seconds (5 mGy) COMPLICATIONS: None immediate. CONTRAST:  None. PROCEDURE: Informed consent was obtained from the patient following explanation of the procedure, risks, benefits and alternatives. The patient understands, agrees and consents for the procedure. All questions were addressed. A time out was performed. Maximal barrier sterile technique utilized including caps, mask, sterile gowns, sterile gloves, large sterile drape, hand hygiene, and 2% chlorhexidine scrub. Under sterile conditions and local anesthesia, right internal jugular micropuncture venous access was performed. Access was performed with ultrasound. Images were obtained for documentation of the patent right internal jugular vein. A guide wire was inserted followed by a transitional dilator. This allowed insertion of a guide wire and catheter into the IVC. Measurements were obtained from the SVC / RA junction back to the right IJ venotomy site. In the right infraclavicular chest, a subcutaneous pocket was created over the second anterior rib. This was done under sterile conditions and local anesthesia. 1% lidocaine with epinephrine was utilized for this. A 2.5 cm incision  was made in the skin. Blunt dissection was performed to create a subcutaneous pocket over the right pectoralis major muscle. The pocket was flushed with saline vigorously. There was adequate hemostasis. The port catheter was assembled and checked for leakage. The port catheter was secured in the pocket with two retention sutures. The tubing was tunneled subcutaneously to the right venotomy site and inserted into the SVC/RA junction through a valved peel-away sheath. Position was confirmed with fluoroscopy. Images were obtained for documentation. The patient tolerated the procedure well. No immediate complications. Incisions were closed in a two layer fashion with 4 - 0 Vicryl suture. Dermabond was applied to the skin. The port  catheter was accessed, blood was aspirated followed by saline and heparin flushes. Needle was removed. A dry sterile dressing was applied. IMPRESSION: Ultrasound and fluoroscopically guided right internal jugular single lumen power port catheter insertion. Tip in the SVC/RA junction. Catheter ready for use. Electronically Signed   By: Jerilynn Mages.  Shick M.D.   On: 07/14/2018 16:24     Subjective: Seen and examined at bedside and was more awake and alert.  After discussing with neuro-oncology she understands the plan and okay with being discharged home and following up with providers.  Denied any concerns or complaints at this time and ready to go home.  Discharge Exam: Vitals:   07/21/18 0202 07/21/18 1559  BP: 92/64 101/66  Pulse: 76 80  Resp: 16 16  Temp: 98.5 F (36.9 C) 98.3 F (36.8 C)  SpO2: 98% 99%   Vitals:   07/21/18 0000 07/21/18 0150 07/21/18 0202 07/21/18 1559  BP: 100/69  92/64 101/66  Pulse: 70  76 80  Resp: 16  16 16   Temp:   98.5 F (36.9 C) 98.3 F (36.8 C)  TempSrc:   Oral Oral  SpO2: 94%  98% 99%  Weight:  77.2 kg    Height:       General: Pt is alert, awake, not in acute distress Cardiovascular: RRR, S1/S2 +, no rubs, no gallops Respiratory: Diminished bilaterally, no wheezing, no rhonchi Abdominal: Soft, NT, Distended slightly 2/2 body habitus, bowel sounds + Extremities: no edema, no cyanosis  The results of significant diagnostics from this hospitalization (including imaging, microbiology, ancillary and laboratory) are listed below for reference.    Microbiology: No results found for this or any previous visit (from the past 240 hour(s)).   Labs: BNP (last 3 results) No results for input(s): BNP in the last 8760 hours. Basic Metabolic Panel: Recent Labs  Lab 07/17/18 1146 07/18/18 0920 07/20/18 1855 07/21/18 1531  NA 137 139 137 141  K 3.0* 3.2* 5.0 4.2  CL 101 104 106 108  CO2 25 21* 15* 21*  GLUCOSE 203* 154* 142* 172*  BUN 11 12 16 20    CREATININE 0.82 0.76 1.02* 0.72  CALCIUM 8.1* 8.2* 7.5* 7.4*   Liver Function Tests: Recent Labs  Lab 07/17/18 1146 07/18/18 0920 07/21/18 0917  AST 298* 340* 275*  ALT 130* 135* 135*  ALKPHOS 421* 387* 367*  BILITOT 3.2* 3.8* 4.0*  PROT 5.5* 5.8* 5.1*  ALBUMIN 2.3* 2.4* 2.1*   No results for input(s): LIPASE, AMYLASE in the last 168 hours. No results for input(s): AMMONIA in the last 168 hours. CBC: Recent Labs  Lab 07/17/18 1146 07/18/18 0920 07/20/18 1855 07/21/18 1531  WBC 6.6 8.6 13.9* 10.3  NEUTROABS 5.2 6.9  --   --   HGB 10.9* 11.4* 11.1* 10.4*  HCT 32.3* 33.8* 33.8* 30.3*  MCV 98.2  97.7 101.5* 96.8  PLT 81* 108* 107* 106*   Cardiac Enzymes: No results for input(s): CKTOTAL, CKMB, CKMBINDEX, TROPONINI in the last 168 hours. BNP: Invalid input(s): POCBNP CBG: Recent Labs  Lab 07/20/18 1849 07/21/18 0718  GLUCAP 135* 101*   D-Dimer No results for input(s): DDIMER in the last 72 hours. Hgb A1c No results for input(s): HGBA1C in the last 72 hours. Lipid Profile No results for input(s): CHOL, HDL, LDLCALC, TRIG, CHOLHDL, LDLDIRECT in the last 72 hours. Thyroid function studies No results for input(s): TSH, T4TOTAL, T3FREE, THYROIDAB in the last 72 hours.  Invalid input(s): FREET3 Anemia work up No results for input(s): VITAMINB12, FOLATE, FERRITIN, TIBC, IRON, RETICCTPCT in the last 72 hours. Urinalysis    Component Value Date/Time   COLORURINE COLORLESS (A) 01/31/2017 1324   APPEARANCEUR CLEAR 01/31/2017 1324   LABSPEC 1.002 (L) 01/31/2017 1324   PHURINE 5.0 01/31/2017 1324   GLUCOSEU NEGATIVE 01/31/2017 1324   HGBUR NEGATIVE 01/31/2017 1324   BILIRUBINUR NEGATIVE 01/31/2017 1324   KETONESUR NEGATIVE 01/31/2017 1324   PROTEINUR NEGATIVE 01/31/2017 1324   NITRITE NEGATIVE 01/31/2017 1324   LEUKOCYTESUR NEGATIVE 01/31/2017 1324   Sepsis Labs Invalid input(s): PROCALCITONIN,  WBC,  LACTICIDVEN Microbiology No results found for this or any  previous visit (from the past 240 hour(s)).  Time coordinating discharge: 35 minutes  SIGNED:  Kerney Elbe, DO [facility] 07/21/2018, 4:41 PM Pager is on [place]  If 7PM-7AM, please contact night-coverage [email or web address] Password [email or web address]

## 2018-07-21 NOTE — Consult Note (Signed)
[place] CONSULT NOTE  Patient Care Team: Patient, No Pcp Per as PCP - General (General Practice)  CHIEF COMPLAINTS/PURPOSE OF CONSULTATION:  Seizure Brain Tumor  HISTORY OF PRESENTING ILLNESS:  Stacey Cox 62 y.o. female presented last night with 2 witnessed episodes of generalized shaking, eyes open, consistent with generalized seizure.  She had just 2 days prior been dosed with first cycle of doxorubicin and cyclophosphamide for progressive hormone positive breast cancer.  She denies any neurologic symptoms prior to the seizures, and at this time she feels completely back to baseline.     MEDICAL HISTORY:  Past Medical History:  Diagnosis Date  . Cancer [facility])    Metastatic Breast Cancer    SURGICAL HISTORY: Past Surgical History:  Procedure Laterality Date  . CHOLECYSTECTOMY    . IR IMAGING GUIDED PORT INSERTION  07/14/2018  . TONSILLECTOMY      SOCIAL HISTORY: Social History   Socioeconomic History  . Marital status: Single    Spouse name: Not on file  . Number of children: Not on file  . Years of education: Not on file  . Highest education level: Not on file  Occupational History  . Not on file  Social Needs  . Financial resource strain: Not on file  . Food insecurity:    Worry: Not on file    Inability: Not on file  . Transportation needs:    Medical: Not on file    Non-medical: Not on file  Tobacco Use  . Smoking status: Former Smoker    Packs/day: 0.00    Types: Cigarettes    Last attempt to quit: 01/21/2017    Years since quitting: 1.4  . Smokeless tobacco: Never Used  Substance and Sexual Activity  . Alcohol use: No    Alcohol/week: 0.0 standard drinks  . Drug use: No  . Sexual activity: Never  Lifestyle  . Physical activity:    Days per week: Not on file    Minutes per session: Not on file  . Stress: Not on file  Relationships  . Social connections:    Talks on phone: Not on file    Gets together: Not on file    Attends  religious service: Not on file    Active member of club or organization: Not on file    Attends meetings of clubs or organizations: Not on file    Relationship status: Not on file  . Intimate partner violence:    Fear of current or ex partner: Not on file    Emotionally abused: Not on file    Physically abused: Not on file    Forced sexual activity: Not on file  Other Topics Concern  . Not on file  Social History Narrative  . Not on file    FAMILY HISTORY: Family History  Problem Relation Age of Onset  . Cancer Mother        breast  . Cancer Sister        breast    ALLERGIES:  is allergic to thorazine [chlorpromazine] and ribociclib.  MEDICATIONS:  Current Facility-Administered Medications  Medication Dose Route Frequency Provider Last Rate Last Dose  . acetaminophen (TYLENOL) tablet 650 mg  650 mg Oral Q6H PRN Opyd, Ilene Qua, MD       Or  . acetaminophen (TYLENOL) suppository 650 mg  650 mg Rectal Q6H PRN Opyd, Ilene Qua, MD      . bisacodyl (DULCOLAX) EC tablet 5 mg  5 mg Oral Daily  PRN Vianne Bulls, MD      . dexamethasone (DECADRON) injection 4 mg  4 mg Intravenous Q6H Blount, Xenia T, NP   4 mg at 07/21/18 1132  . escitalopram (LEXAPRO) tablet 20 mg  20 mg Oral Daily Opyd, Ilene Qua, MD   20 mg at 07/21/18 1010  . feeding supplement (ENSURE ENLIVE) (ENSURE ENLIVE) liquid 237 mL  237 mL Oral BID BM Sheikh, Omair [place], DO      . HYDROcodone-acetaminophen (NORCO/VICODIN) 5-325 MG per tablet 1-2 tablet  1-2 tablet Oral Q4H PRN Opyd, Ilene Qua, MD      . levETIRAcetam (KEPPRA) 750 mg in sodium chloride 0.9 % 100 mL IVPB  750 mg Intravenous BID Raiford Noble Latif, DO 430 mL/hr at 07/21/18 1316 750 mg at 07/21/18 1316  . lidocaine-prilocaine (EMLA) cream 1 application  1 application Topical Daily PRN Opyd, Ilene Qua, MD      . LORazepam (ATIVAN) injection 1 mg  1 mg Intravenous Q6H PRN Opyd, Ilene Qua, MD      . LORazepam (ATIVAN) tablet 0.5 mg  0.5 mg Oral Q6H PRN Opyd,  Ilene Qua, MD      . morphine 4 MG/ML injection 3-4 mg  3-4 mg Intravenous Q4H PRN Opyd, Ilene Qua, MD      . ondansetron (ZOFRAN) tablet 4 mg  4 mg Oral Q6H PRN Opyd, Ilene Qua, MD       Or  . ondansetron (ZOFRAN) injection 4 mg  4 mg Intravenous Q6H PRN Opyd, Ilene Qua, MD      . pantoprazole (PROTONIX) EC tablet 40 mg  40 mg Oral Daily Raiford Noble [place], DO   40 mg at 07/21/18 1132  . scopolamine (TRANSDERM-SCOP) 1 MG/3DAYS 1.5 mg  1 patch Transdermal Q72H Opyd, Ilene Qua, MD   1.5 mg at 07/21/18 1012  . senna-docusate (Senokot-S) tablet 1 tablet  1 tablet Oral QHS PRN Opyd, Ilene Qua, MD        REVIEW OF SYSTEMS:   Constitutional: Denies fevers, chills or abnormal weight loss Eyes: Denies blurriness of vision Ears, nose, mouth, throat, and face: Denies mucositis or sore throat Respiratory: Denies cough, dyspnea or wheezes Cardiovascular: Denies palpitation, chest discomfort or lower extremity swelling Gastrointestinal:  Denies nausea, constipation, diarrhea GU: Denies dysuria or incontinence Skin: Denies abnormal skin rashes Neurological: Per HPI Musculoskeletal: Denies joint pain, back or neck discomfort. No decrease in ROM Behavioral/Psych: Denies anxiety, disturbance in thought content, and mood instability   PHYSICAL EXAMINATION: Vitals:   07/21/18 0000 07/21/18 0202  BP: 100/69 92/64  Pulse: 70 76  Resp: 16 16  Temp:  98.5 F (36.9 C)  SpO2: 94% 98%   KPS: 90. General: Alert, cooperative, pleasant, in no acute distress Head: Craniotomy scar noted, dry and intact. EENT: No conjunctival injection or scleral icterus. Oral mucosa moist Lungs: Resp effort normal Cardiac: Regular rate and rhythm Abdomen: Soft, non-distended abdomen Skin: No rashes cyanosis or petechiae. Extremities: No clubbing or edema  NEUROLOGIC EXAM: Mental Status: Awake, alert, attentive to examiner. Oriented to self and environment. Language is fluent with intact comprehension.  Cranial  Nerves: Visual acuity is grossly normal. Visual fields are full. Extra-ocular movements intact. No ptosis. Face is symmetric, tongue midline. Motor: Tone and bulk are normal. Power is full in both arms and legs. Reflexes are symmetric, no pathologic reflexes present. Intact finger to nose bilaterally Sensory: Intact to light touch and temperature Gait: Deferred   LABORATORY DATA:  I have reviewed the  data as listed Lab Results  Component Value Date   WBC 13.9 (H) 07/20/2018   HGB 11.1 (L) 07/20/2018   HCT 33.8 (L) 07/20/2018   MCV 101.5 (H) 07/20/2018   PLT 107 (L) 07/20/2018   Recent Labs    07/17/18 1146 07/18/18 0920 07/20/18 1855 07/21/18 0917  NA 137 139 137  --   K 3.0* 3.2* 5.0  --   CL 101 104 106  --   CO2 25 21* 15*  --   GLUCOSE 203* 154* 142*  --   BUN 11 12 16   --   CREATININE 0.82 0.76 1.02*  --   CALCIUM 8.1* 8.2* 7.5*  --   GFRNONAA >60 >60 58*  --   GFRAA >60 >60 >60  --   PROT 5.5* 5.8*  --  5.1*  ALBUMIN 2.3* 2.4*  --  2.1*  AST 298* 340*  --  275*  ALT 130* 135*  --  135*  ALKPHOS 421* 387*  --  367*  BILITOT 3.2* 3.8*  --  4.0*  BILIDIR  --   --   --  2.4*  IBILI  --   --   --  1.6*    RADIOGRAPHIC STUDIES: I have personally reviewed the radiological images as listed and agreed with the findings in the report. Ct Head W Or Wo Contrast  Result Date: 07/20/2018 CLINICAL DATA:  New onset witnessed seizures. History of metastatic breast cancer. EXAM: CT HEAD WITHOUT AND WITH CONTRAST TECHNIQUE: Contiguous axial images were obtained from the base of the skull through the vertex without and with intravenous contrast CONTRAST:  28mL OMNIPAQUE IOHEXOL 300 MG/ML  SOLN COMPARISON:  MRI head January 22, 2017 FINDINGS: BRAIN: Irregular cystic and solid 1.8 x 2.4 x 3.9 cm LEFT frontal cortical based mass with potential dural component. Severe surrounding vasogenic edema with mass effect on LEFT frontal horn of the lateral ventricle. 5 mm resultant LEFT to RIGHT  subfalcine herniation. Stable appearance of RIGHT posterior fossa 8 x 12 mm enhancing mass most compatible with meningioma. Similar characteristic 6 mm prepontine mass. No hydrocephalus. Stable appearance of symmetric inferior cerebellar atrophy VASCULAR: Moderate calcific atherosclerosis of the carotid siphons. SKULL: No skull fracture. Sclerotic expanded LEFT parietooccipital calvarium consistent with metastatic disease. 2.3 cm sclerotic metastasis RIGHT frontal calvarium. No significant scalp soft tissue swelling. SINUSES/ORBITS: Severe frontal sinusitis. Mastoid air cells are well aerated.The included ocular globes and orbital contents are non-suspicious. OTHER: None. IMPRESSION: 1. 1.8 x 2.4 x 3.9 cm LEFT frontal cortical based mass with possible dural component highly concerning for metastatic disease, less likely abscess or cerebritis. Severe vasogenic edema with 5 mm LEFT to RIGHT subfalcine herniation. No ventricular entrapment. Recommend MRI of the brain with without contrast. 2. 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 3. Multiple calvarial metastasis. 4. Cerebellar atrophy. 5. Acute findings discussed with and reconfirmed by Dr.ELLIOTT WENTZ on 07/20/2018 at 9:41 pm. Electronically Signed   By: Elon Alas M.D.   On: 07/20/2018 21:42   Ct Chest W Contrast  Result Date: 07/06/2018 CLINICAL DATA:  Metastatic breast carcinoma. Recent episodes of vomiting. Some weight loss. EXAM: CT CHEST, ABDOMEN, AND PELVIS WITH CONTRAST TECHNIQUE: Multidetector CT imaging of the chest, abdomen and pelvis was performed following the standard protocol during bolus administration of intravenous contrast. CONTRAST:  160mL OMNIPAQUE IOHEXOL 300 MG/ML  SOLN COMPARISON:  CT 02/22/2018 FINDINGS: CT CHEST FINDINGS Cardiovascular: No significant vascular findings. Normal  heart size. No pericardial effusion. Mediastinum/Nodes: Axillary supraclavicular adenopathy. No mediastinal  hilar adenopathy. No pericardial effusion. Esophagus normal. Lungs/Pleura: No suspicious pulmonary nodules Musculoskeletal: Multiple sclerotic lesions spine and ribs unchanged CT ABDOMEN AND PELVIS FINDINGS Hepatobiliary: There is new infiltrative pattern of enhancing lesions involving large portion of the central liver (segment 5, segment 8, 4A, and 4 B). This is in a region of a previously enhancing lesion in the RIGHT hepatic lobe which is now difficult define on the background this new infiltrative process. There are several discrete lesions including a 2.5 cm peripheral enhancing lesion in the central RIGHT hepatic lobe (image 47/2). More peripheral subcapsular lesions in the dome of liver with irregular peripheral enhancement measured 2.1 x 0.8 cm on image 42/2 and 1.6 by 2.3 cm on image 38/2. The infiltrative mass like pattern involves broad segment measuring 18 x 8 cm on image 52/2 for example. Very small amount of fluid along the RIGHT hepatic lobe and paraspinal liver no biliary duct dilatation. Portal veins patent. Postcholecystectomy. Pancreas: Pancreas is normal. No ductal dilatation. No pancreatic inflammation. Spleen: Small low-density lesions spleen is favored benign cysts. Adrenals/urinary tract: Adrenal glands, kidneys, ureters and bladder normal. Stomach/Bowel: Stomach, small bowel, appendix, and cecum are normal. The colon and rectosigmoid colon are normal. Vascular/Lymphatic: Abdominal aorta is normal caliber. There is no retroperitoneal or periportal lymphadenopathy. No pelvic lymphadenopathy. Reproductive: Uterus normal. Other: Small free fluid the pelvis. Musculoskeletal: Multiple densely sclerotic lesions in the pelvis and spine are not changed from comparison exam IMPRESSION: Chest Impression: 1. No evidence pulmonary metastasis or lymphadenopathy. 2. Stable dense sclerotic skeletal metastasis. Abdomen / Pelvis Impression: 1. New pattern of heterogeneous enhancement and enhancing  ill-defined lesions in the central LEFT and RIGHT hepatic lobe at site prior enhancing lesion is highly concerning for progression of of infiltrative malignancy in the liver occupying a large portion of the RIGHT hepatic lobe and central LEFT hepatic lobe. 2. Small amount of free fluid in the abdomen pelvis. 3. No metastatic adenopathy. 4. Stable  skeletal sclerotic metastasis. These results will be called to the ordering clinician or representative by the Radiologist Assistant, and communication documented in the PACS or zVision Dashboard. Electronically Signed   By: Suzy Bouchard M.D.   On: 07/06/2018 14:37   Mr Jeri Cos BS Contrast  Result Date: 07/21/2018 CLINICAL DATA:  62 y/o F; metastatic breast cancer presenting with seizure. EXAM: MRI HEAD WITHOUT AND WITH CONTRAST TECHNIQUE: Multiplanar, multiecho pulse sequences of the brain and surrounding structures were obtained without and with intravenous contrast. CONTRAST:  90mL MULTIHANCE GADOBENATE DIMEGLUMINE 529 MG/ML IV SOLN COMPARISON:  07/20/2018 CT head FINDINGS: Brain: Plaque-like irregular enhancing mass with broad base to the dura measuring 5.1 x 2.2 x 2.4 cm. The mass likely originates from the dura with invasion of the underlying left frontal lobe. There is diffuse smooth dural thickening over the left cerebral convexity which may represent associated edema and/or invasive neoplasm. There is T2 FLAIR signal abnormality compatible with edema throughout the surrounding left frontal lobe extending into the anterior insula. Mass effect from the lesion and brain edema partially effaces the frontal horn of left lateral ventricle and results in 7 mm of left-to-right midline shift of the anterior septum pellucidum. No herniation. Asymmetric cerebellar volume loss. No reduced diffusion to suggest acute or early subacute infarction. No additional focus of abnormal enhancement of the brain. No abnormal susceptibility hypointensity to indicate hemorrhage.  Vascular: Normal flow voids. Skull and upper cervical spine: There is  confluent low signal in bone marrow throughout the left frontal bone, occipital bone, right temporal squamous likely representing osseous metastatic disease. Sinuses/Orbits: Negative. Other: None. IMPRESSION: 1. Irregular plaque-like mass, likely metastasis, measuring up to 5.1 cm, appears to originate from the left anterolateral frontal dura with invasion of the underlying frontal lobe of the brain. Mass effect from brain edema and the lesion partially effaces the frontal horn of left lateral ventricle and results in 7 mm left-to-right midline shift of anterior septum pellucidum. 2. Diffuse dural thickening over the left cerebral convexity may represent edema associated with the tumor or invasive neoplasm. 3. 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 This may be due to alcoholic cerebellar degeneration, phenytoin/lithium toxicity, chronic sequelae of cerebellitis, associated with an inherited ataxia syndrome, or represent a neurodegenerate disorder such as multi-system atrophy or olivopontocerebellar atrophy. Electronically Signed   By: Kristine Garbe M.D.   On: 07/21/2018 01:14   Ct Abdomen Pelvis W Contrast  Result Date: 07/06/2018 CLINICAL DATA:  Metastatic breast carcinoma. Recent episodes of vomiting. Some weight loss. EXAM: CT CHEST, ABDOMEN, AND PELVIS WITH CONTRAST TECHNIQUE: Multidetector CT imaging of the chest, abdomen and pelvis was performed following the standard protocol during bolus administration of intravenous contrast. CONTRAST:  133mL OMNIPAQUE IOHEXOL 300 MG/ML  SOLN COMPARISON:  CT 02/22/2018 FINDINGS: CT CHEST FINDINGS Cardiovascular: No significant vascular findings. Normal heart size. No pericardial effusion. Mediastinum/Nodes: Axillary supraclavicular adenopathy. No mediastinal hilar adenopathy. No pericardial effusion. Esophagus normal. Lungs/Pleura: No  suspicious pulmonary nodules Musculoskeletal: Multiple sclerotic lesions spine and ribs unchanged CT ABDOMEN AND PELVIS FINDINGS Hepatobiliary: There is new infiltrative pattern of enhancing lesions involving large portion of the central liver (segment 5, segment 8, 4A, and 4 B). This is in a region of a previously enhancing lesion in the RIGHT hepatic lobe which is now difficult define on the background this new infiltrative process. There are several discrete lesions including a 2.5 cm peripheral enhancing lesion in the central RIGHT hepatic lobe (image 47/2). More peripheral subcapsular lesions in the dome of liver with irregular peripheral enhancement measured 2.1 x 0.8 cm on image 42/2 and 1.6 by 2.3 cm on image 38/2. The infiltrative mass like pattern involves broad segment measuring 18 x 8 cm on image 52/2 for example. Very small amount of fluid along the RIGHT hepatic lobe and paraspinal liver no biliary duct dilatation. Portal veins patent. Postcholecystectomy. Pancreas: Pancreas is normal. No ductal dilatation. No pancreatic inflammation. Spleen: Small low-density lesions spleen is favored benign cysts. Adrenals/urinary tract: Adrenal glands, kidneys, ureters and bladder normal. Stomach/Bowel: Stomach, small bowel, appendix, and cecum are normal. The colon and rectosigmoid colon are normal. Vascular/Lymphatic: Abdominal aorta is normal caliber. There is no retroperitoneal or periportal lymphadenopathy. No pelvic lymphadenopathy. Reproductive: Uterus normal. Other: Small free fluid the pelvis. Musculoskeletal: Multiple densely sclerotic lesions in the pelvis and spine are not changed from comparison exam IMPRESSION: Chest Impression: 1. No evidence pulmonary metastasis or lymphadenopathy. 2. Stable dense sclerotic skeletal metastasis. Abdomen / Pelvis Impression: 1. New pattern of heterogeneous enhancement and enhancing ill-defined lesions in the central LEFT and RIGHT hepatic lobe at site prior enhancing  lesion is highly concerning for progression of of infiltrative malignancy in the liver occupying a large portion of the RIGHT hepatic lobe and central LEFT hepatic lobe. 2. Small amount of free fluid in the abdomen pelvis. 3. No metastatic adenopathy. 4. Stable  skeletal sclerotic metastasis. These results will be called to the ordering clinician or  representative by the Radiologist Assistant, and communication documented in the PACS or zVision Dashboard. Electronically Signed   By: Suzy Bouchard M.D.   On: 07/06/2018 14:37   US Abdomen Limited  Result Date: 07/18/2018 CLINICAL DATA:  Liver lesion. Trace ascites noted on prior CT. Paracentesis requested in preparation for percutaneous liver lesion biopsy. EXAM: LIMITED ABDOMEN ULTRASOUND FOR ASCITES TECHNIQUE: Limited ultrasound survey for ascites was performed in all four abdominal quadrants. COMPARISON:  CT 07/06/2018 FINDINGS: Trace abdominal ascites. No adequate pocket for safe paracentesis, which was deferred. IMPRESSION: Trace abdominal ascites.  Paracentesis deferred. Electronically Signed   By: Lucrezia Europe M.D.   On: 07/18/2018 12:38   US Biopsy (liver)  Result Date: 07/18/2018 CLINICAL DATA:  Multiple liver lesions. History of breast carcinoma. EXAM: ULTRASOUND-GUIDED CORE LIVER BIOPSY TECHNIQUE: An ultrasound guided liver biopsy was thoroughly discussed with the patient and questions were answered. The benefits, risks, alternatives, and complications were also discussed. The patient understands and wishes to proceed with the procedure. A verbal as well as written consent was obtained. Survey ultrasound of the liver was performed, a representative lesion localized in the right lobe, and an appropriate skin entry site was determined. Skin site was marked, prepped with chlorhexidine, and draped in usual sterile fashion, and infiltrated locally with 1% lidocaine. Intravenous Fentanyl and Versed were administered as conscious sedation during  continuous monitoring of the patient's level of consciousness and physiological / cardiorespiratory status by the radiology RN, with a total moderate sedation time of 10 minutes. A 17 gauge trocar needle was advanced under ultrasound guidance into the liver to the margin of the lesion. 3 coaxial 18gauge core samples were then obtained through the guide needle. The guide needle was removed. Post procedure scans demonstrate no apparent complication. COMPLICATIONS: COMPLICATIONS None immediate FINDINGS: Multiple low-attenuation liver lesions were localized. Representative core biopsy samples obtained as above. IMPRESSION: 1. Technically successful ultrasound guided core liver lesion biopsy. Electronically Signed   By: Lucrezia Europe M.D.   On: 07/18/2018 16:53   Ir Imaging Guided Port Insertion  Result Date: 07/14/2018 CLINICAL DATA:  Metastatic breast cancer EXAM: RIGHT INTERNAL JUGULAR SINGLE LUMEN POWER PORT CATHETER INSERTION Date:  07/14/2018 07/14/2018 4:04 pm Radiologist:  Jerilynn Mages. Daryll Brod, MD Guidance:  Ultrasound fluoroscopic MEDICATIONS: Ancef 2 g; The antibiotic was administered within an appropriate time interval prior to skin puncture. ANESTHESIA/SEDATION: Versed 3.0 mg IV; Fentanyl 100 mcg IV; Moderate Sedation Time:  23 minutes The patient was continuously monitored during the procedure by the interventional radiology nurse under my direct supervision. FLUOROSCOPY TIME:  0 minutes, 36 seconds (5 mGy) COMPLICATIONS: None immediate. CONTRAST:  None. PROCEDURE: Informed consent was obtained from the patient following explanation of the procedure, risks, benefits and alternatives. The patient understands, agrees and consents for the procedure. All questions were addressed. A time out was performed. Maximal barrier sterile technique utilized including caps, mask, sterile gowns, sterile gloves, large sterile drape, hand hygiene, and 2% chlorhexidine scrub. Under sterile conditions and local anesthesia, right  internal jugular micropuncture venous access was performed. Access was performed with ultrasound. Images were obtained for documentation of the patent right internal jugular vein. A guide wire was inserted followed by a transitional dilator. This allowed insertion of a guide wire and catheter into the IVC. Measurements were obtained from the SVC / RA junction back to the right IJ venotomy site. In the right infraclavicular chest, a subcutaneous pocket was created over the second anterior rib. This was done under sterile conditions  and local anesthesia. 1% lidocaine with epinephrine was utilized for this. A 2.5 cm incision was made in the skin. Blunt dissection was performed to create a subcutaneous pocket over the right pectoralis major muscle. The pocket was flushed with saline vigorously. There was adequate hemostasis. The port catheter was assembled and checked for leakage. The port catheter was secured in the pocket with two retention sutures. The tubing was tunneled subcutaneously to the right venotomy site and inserted into the SVC/RA junction through a valved peel-away sheath. Position was confirmed with fluoroscopy. Images were obtained for documentation. The patient tolerated the procedure well. No immediate complications. Incisions were closed in a two layer fashion with 4 - 0 Vicryl suture. Dermabond was applied to the skin. The port catheter was accessed, blood was aspirated followed by saline and heparin flushes. Needle was removed. A dry sterile dressing was applied. IMPRESSION: Ultrasound and fluoroscopically guided right internal jugular single lumen power port catheter insertion. Tip in the SVC/RA junction. Catheter ready for use. Electronically Signed   By: Jerilynn Mages.  Shick M.D.   On: 07/14/2018 16:24    ASSESSMENT & PLAN:  Brain Tumor Seizures  Ms. Hennes's seizures were likely provoked by recent new-start chemotherapy, with of course underlying tumor being the epileptic focus.    Her MRI  demonstrates dural enhancement which is either reactive/inflammatory or neoplastic.  We recommend repeating an MRI on 3T scanner after 3-5 days of steroids for better characterization and radiation planning.  This can be done as an outpatient.      Likely not candidate for resection at this time.  Recommend she continue Keppra 750mg  BID and decadron 4mg  daily upon discharge.   Will continue to follow up with radiation oncology and neuro-oncology as an outpatient.  All questions were answered. The patient knows to call the clinic with any problems, questions or concerns.  The total time spent in the encounter was 55 minutes and more than 50% was on counseling and review of test results     Ventura Sellers, MD 07/21/2018 3:24 PM

## 2018-07-22 LAB — HIV ANTIBODY (ROUTINE TESTING W REFLEX): HIV SCREEN 4TH GENERATION: NONREACTIVE

## 2018-07-25 ENCOUNTER — Inpatient Hospital Stay (HOSPITAL_BASED_OUTPATIENT_CLINIC_OR_DEPARTMENT_OTHER): Payer: BC Managed Care – PPO | Admitting: Hematology

## 2018-07-25 ENCOUNTER — Other Ambulatory Visit: Payer: Self-pay | Admitting: *Deleted

## 2018-07-25 ENCOUNTER — Other Ambulatory Visit: Payer: Self-pay | Admitting: Radiation Therapy

## 2018-07-25 ENCOUNTER — Telehealth: Payer: Self-pay

## 2018-07-25 ENCOUNTER — Other Ambulatory Visit: Payer: Self-pay | Admitting: Hematology

## 2018-07-25 ENCOUNTER — Inpatient Hospital Stay: Payer: BC Managed Care – PPO | Attending: Hematology

## 2018-07-25 VITALS — BP 105/79 | HR 120 | Temp 98.5°F | Resp 18 | Ht 65.0 in | Wt 162.2 lb

## 2018-07-25 DIAGNOSIS — C7931 Secondary malignant neoplasm of brain: Secondary | ICD-10-CM | POA: Insufficient documentation

## 2018-07-25 DIAGNOSIS — C7951 Secondary malignant neoplasm of bone: Secondary | ICD-10-CM | POA: Insufficient documentation

## 2018-07-25 DIAGNOSIS — C50812 Malignant neoplasm of overlapping sites of left female breast: Secondary | ICD-10-CM

## 2018-07-25 DIAGNOSIS — R569 Unspecified convulsions: Secondary | ICD-10-CM | POA: Insufficient documentation

## 2018-07-25 DIAGNOSIS — M7989 Other specified soft tissue disorders: Secondary | ICD-10-CM | POA: Diagnosis not present

## 2018-07-25 DIAGNOSIS — Z5189 Encounter for other specified aftercare: Secondary | ICD-10-CM | POA: Insufficient documentation

## 2018-07-25 DIAGNOSIS — C787 Secondary malignant neoplasm of liver and intrahepatic bile duct: Secondary | ICD-10-CM | POA: Diagnosis not present

## 2018-07-25 DIAGNOSIS — C7800 Secondary malignant neoplasm of unspecified lung: Secondary | ICD-10-CM | POA: Insufficient documentation

## 2018-07-25 DIAGNOSIS — E559 Vitamin D deficiency, unspecified: Secondary | ICD-10-CM | POA: Insufficient documentation

## 2018-07-25 DIAGNOSIS — C50919 Malignant neoplasm of unspecified site of unspecified female breast: Secondary | ICD-10-CM

## 2018-07-25 DIAGNOSIS — G893 Neoplasm related pain (acute) (chronic): Secondary | ICD-10-CM | POA: Diagnosis not present

## 2018-07-25 DIAGNOSIS — Z170 Estrogen receptor positive status [ER+]: Secondary | ICD-10-CM | POA: Insufficient documentation

## 2018-07-25 DIAGNOSIS — Z5111 Encounter for antineoplastic chemotherapy: Secondary | ICD-10-CM | POA: Insufficient documentation

## 2018-07-25 LAB — CBC WITH DIFFERENTIAL/PLATELET
Basophils Absolute: 0 10*3/uL (ref 0.0–0.1)
Basophils Relative: 0 %
EOS PCT: 0 %
Eosinophils Absolute: 0 10*3/uL (ref 0.0–0.5)
HEMATOCRIT: 32.5 % — AB (ref 34.8–46.6)
Hemoglobin: 11 g/dL — ABNORMAL LOW (ref 11.6–15.9)
LYMPHS ABS: 0.5 10*3/uL — AB (ref 0.9–3.3)
LYMPHS PCT: 8 %
MCH: 32.8 pg (ref 25.1–34.0)
MCHC: 33.8 g/dL (ref 31.5–36.0)
MCV: 97 fL (ref 79.5–101.0)
MONO ABS: 0.2 10*3/uL (ref 0.1–0.9)
MONOS PCT: 3 %
NEUTROS ABS: 6 10*3/uL (ref 1.5–6.5)
Neutrophils Relative %: 89 %
PLATELETS: 81 10*3/uL — AB (ref 145–400)
RBC: 3.35 MIL/uL — ABNORMAL LOW (ref 3.70–5.45)
RDW: 19.6 % — ABNORMAL HIGH (ref 11.2–14.5)
WBC: 6.8 10*3/uL (ref 3.9–10.3)

## 2018-07-25 LAB — CMP (CANCER CENTER ONLY)
ALT: 159 U/L — AB (ref 0–44)
AST: 252 U/L — AB (ref 15–41)
Albumin: 2.4 g/dL — ABNORMAL LOW (ref 3.5–5.0)
Alkaline Phosphatase: 565 U/L — ABNORMAL HIGH (ref 38–126)
Anion gap: 11 (ref 5–15)
BILIRUBIN TOTAL: 6.1 mg/dL — AB (ref 0.3–1.2)
BUN: 13 mg/dL (ref 8–23)
CHLORIDE: 104 mmol/L (ref 98–111)
CO2: 22 mmol/L (ref 22–32)
CREATININE: 0.85 mg/dL (ref 0.44–1.00)
Calcium: 8.3 mg/dL — ABNORMAL LOW (ref 8.9–10.3)
Glucose, Bld: 124 mg/dL — ABNORMAL HIGH (ref 70–99)
POTASSIUM: 4.7 mmol/L (ref 3.5–5.1)
Sodium: 137 mmol/L (ref 135–145)
TOTAL PROTEIN: 6 g/dL — AB (ref 6.5–8.1)

## 2018-07-25 LAB — PHOSPHORUS: PHOSPHORUS: 1.3 mg/dL — AB (ref 2.5–4.6)

## 2018-07-25 LAB — MAGNESIUM: MAGNESIUM: 2.2 mg/dL (ref 1.7–2.4)

## 2018-07-25 NOTE — Progress Notes (Signed)
[name]    HEMATOLOGY/ONCOLOGY CLINIC NOTE  Date of Service: 07/25/18     Patient Care Team: Patient, No Pcp Per as PCP - General (General Practice)  CHIEF COMPLAINTS/PURPOSE OF CONSULTATION:   F/u for metastatic ER/PR +ve, Her2 neg breast cancer  HISTORY OF PRESENTING ILLNESS:   plz see previous notes for details.  INTERVAL HISTORY   Stacey Cox is here for follow-up of her metastatic hormone positive HER-2 negative breast cancer. The patient's last visit with [place] was on 07/17/18. She is accompanied today by two friends. The pt reports that she is doing well overall.   In the interim the pt received C1 Paclitaxel on 07/19/18, and then presented to the ED on 07/20/18 after develop two episodes of generalized seizure activity at home during the night. She notes that she did lose consciousness and that this occurred while sleeping in her chair, which allowed her sister to observe this. She was discharged with Kepra without break through seizures. She will be having a repeat MRI Brain on 07/28/18. She was also increased on Dexamethasone to 4m.   The pt reports that she is [place] She has also begun seeing Dr. VMickeal Skinnerin neuro oncology.   The pt notes that she has been very tired recently but is not having any nausea, abdominal pains, and diarrhea. She notes that she is eating well.   Of note since the patient's last visit, pt has had Liver needle/core biopsy right lesion completed on 07/18/18 with results revealing 100% cells Estrogen positive.   07/20/18 CT Head revealed 1.8 x 2.4 x 3.9 cm LEFT frontal cortical based mass with possible dural component highly concerning for metastatic disease, less likely abscess or cerebritis. Severe vasogenic edema with 5 mm LEFT to RIGHT subfalcine herniation. No ventricular entrapment. 2. 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 3. Multiple  calvarial metastasis. 4. Cerebellar atrophy.   07/21/18 MRI Brain revealed Irregular plaque-like mass, likely metastasis, measuring up to 5.1 cm, appears to originate from the left anterolateral frontal dura with invasion of the underlying frontal lobe of the brain. Mass effect from brain edema and the lesion partially effaces the frontal horn of left lateral ventricle and results in 7 mm left-to-right midline shift of anterior septum pellucidum. 2. Diffuse dural thickening over the left cerebral convexity may represent edema associated with the tumor or invasive neoplasm. 3. 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 This may be due to alcoholic cerebellar degeneration, phenytoin/lithium toxicity, chronic sequelae of cerebellitis, associated with an inherited ataxia syndrome, or represent a neurodegenerate disorder such as multi-system atrophy or olivopontocerebellar atrophy  Lab results today (07/25/18) of CBC w/diff, CMP is as follows: all values are WNL except for RBC at 3.35, HGB at 11.0, HCT at 32.5, RDW at 19.6, PLT at 81k, Lymphs abs at 500, Glucose at 124, Calcium at 8.3, Total Protein at 6.0, Albumin at 2.4, AST at 252, ALT at 159, Alk Phos at 565, Total Bilirubin at 6.1. 07/25/18 Magnesium is WNL at 2.2  On review of systems, pt reports recent generalized seizures, eating better, feeling tired, and denies abdominal pains, nausea, headaches, diarrhea, vomiting, and any other symptoms.    MEDICAL HISTORY:  Past Medical History:  Diagnosis Date   Cancer ([facility]    Metastatic Breast Cancer    SURGICAL HISTORY: Past Surgical History:  Procedure Laterality Date  CHOLECYSTECTOMY     IR IMAGING GUIDED PORT INSERTION  07/14/2018   TONSILLECTOMY      SOCIAL HISTORY: Social History   Socioeconomic History   Marital status: Single    Spouse name: Not on file   Number of children: Not on file   Years of education: Not on file   Highest  education level: Not on file  Occupational History   Not on file  Social Needs   Financial resource strain: Not on file   Food insecurity:    Worry: Not on file    Inability: Not on file   Transportation needs:    Medical: Not on file    Non-medical: Not on file  Tobacco Use   Smoking status: Former Smoker    Packs/day: 0.00    Types: Cigarettes    Last attempt to quit: 01/21/2017    Years since quitting: 1.5   Smokeless tobacco: Never Used  Substance and Sexual Activity   Alcohol use: No    Alcohol/week: 0.0 standard drinks   Drug use: No   Sexual activity: Never  Lifestyle   Physical activity:    Days per week: Not on file    Minutes per session: Not on file   Stress: Not on file  Relationships   Social connections:    Talks on phone: Not on file    Gets together: Not on file    Attends religious service: Not on file    Active member of club or organization: Not on file    Attends meetings of clubs or organizations: Not on file    Relationship status: Not on file   Intimate partner violence:    Fear of current or ex partner: Not on file    Emotionally abused: Not on file    Physically abused: Not on file    Forced sexual activity: Not on file  Other Topics Concern   Not on file  Social History Narrative   Not on file  Cigarette smoker 1/2 PPD for about 40 yrs Social alcohol use 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  Single no children  FAMILY HISTORY: Family History  Problem Relation Age of Onset   Cancer Mother        breast   Cancer Sister        breast  Mother with h/o breast cancer in her 74's Sister with breast cancer in her 16ys and later was diagnosed with multiple myeloma. (Patient is not aware of any specific breast cancer mutations present)  ALLERGIES:  is allergic to thorazine [chlorpromazine] and ribociclib.  MEDICATIONS:  Current Outpatient Medications  Medication Sig Dispense Refill   aspirin EC 81 MG tablet Take 1 tablet (81 mg  total) by mouth daily.     dexamethasone (DECADRON) 4 MG tablet Take 1 tablet (4 mg total) by mouth daily. 30 tablet 0   escitalopram (LEXAPRO) 20 MG tablet TAKE 1 TABLET(20 MG) BY MOUTH DAILY (Patient taking differently: Take 20 mg by mouth daily as needed (anxiety). ) 30 tablet 0   feeding supplement, ENSURE ENLIVE, (ENSURE ENLIVE) LIQD Take 237 mLs by mouth 2 (two) times daily between meals. 237 mL 12   hydrOXYzine (ATARAX/VISTARIL) 10 MG tablet Take 2.5 tablets (25 mg total) by mouth 2 (two) times daily as needed for nausea or vomiting. 30 tablet 1   levETIRAcetam (KEPPRA) 750 MG tablet Take 1 tablet (750 mg total) by mouth 2 (two) times daily. 60 tablet 0   lidocaine-prilocaine (EMLA) cream Apply  to affected area once (Patient taking differently: Apply 1 application topically daily as needed (pain). ) 30 g 3   loperamide (IMODIUM) 1 MG/5ML solution Take 2 mg by mouth as needed for diarrhea or loose stools.      LORazepam (ATIVAN) 0.5 MG tablet Take 1 tablet (0.5 mg total) by mouth every 6 (six) hours as needed. for anxiety 60 tablet 0   ondansetron (ZOFRAN) 4 MG tablet Take 1 tablet (4 mg total) by mouth 2 (two) times daily. Take with Verzenio. (Patient taking differently: Take 4 mg by mouth every 8 (eight) hours as needed for nausea or vomiting. ) 60 tablet 0   pantoprazole (PROTONIX) 40 MG tablet Take 1 tablet (40 mg total) by mouth daily. 30 tablet 0   potassium chloride SA (K-DUR,KLOR-CON) 20 MEQ tablet Take 1 tablet (20 mEq total) by mouth 2 (two) times daily. 30 tablet 0   scopolamine (TRANSDERM-SCOP) 1 MG/3DAYS Place 1 patch (1.5 mg total) onto the skin every 3 (three) days. 10 patch 0   Vitamin D, Ergocalciferol, (DRISDOL) 50000 units CAPS capsule TAKE 1 CAPSULE BY MOUTH 3 TIMES WEEKLY 24 capsule 0   No current facility-administered medications for this visit.     REVIEW OF SYSTEMS:    A 10+ POINT REVIEW OF SYSTEMS WAS OBTAINED including neurology, dermatology,  psychiatry, cardiac, respiratory, lymph, extremities, GI, GU, Musculoskeletal, constitutional, breasts, reproductive, HEENT.  All pertinent positives are noted in the HPI.  All others are negative.   PHYSICAL EXAMINATION:  ECOG PERFORMANCE STATUS: 1 - Symptomatic but completely ambulatory  Vitals:   07/25/18 1609  BP: 105/79  Pulse: (!) 120  Resp: 18  Temp: 98.5 F (36.9 C)  SpO2: 98%   Filed Weights   07/25/18 1609  Weight: 162 lb 3.2 oz (73.6 kg)   .Body mass index is 26.99 kg/m.  GENERAL:alert, in no acute distress and comfortable SKIN: no acute rashes, no significant lesions EYES: conjunctiva are pink and non-injected, sclera anicteric OROPHARYNX: MMM, no exudates, no oropharyngeal erythema or ulceration NECK: supple, no JVD LYMPH:  no palpable lymphadenopathy in the cervical, axillary or inguinal regions LUNGS: clear to auscultation b/l with normal respiratory effort HEART: regular rate & rhythm ABDOMEN:  normoactive bowel sounds , RUQ tenderness to palpation without guarding or rigidity, not distended. No palpable hepatosplenomegaly.  Extremity: no pedal edema PSYCH: alert & oriented x 3 with fluent speech NEURO: no focal motor/sensory deficits   LABORATORY DATA:  I have reviewed the data as listed  . CBC Latest Ref Rng & Units 07/25/2018 07/21/2018 07/20/2018  WBC 3.9 - 10.3 K/uL 6.8 10.3 13.9(H)  Hemoglobin 11.6 - 15.9 g/dL 11.0(L) 10.4(L) 11.1(L)  Hematocrit 34.8 - 46.6 % 32.5(L) 30.3(L) 33.8(L)  Platelets 145 - 400 K/uL 81(L) 106(L) 107(L)  ANC 2.7k   CBC    Component Value Date/Time   WBC 6.8 07/25/2018 1529   RBC 3.35 (L) 07/25/2018 1529   HGB 11.0 (L) 07/25/2018 1529   HGB 12.8 05/24/2018 0905   HGB 13.9 11/24/2017 1340   HCT 32.5 (L) 07/25/2018 1529   HCT 42.0 11/24/2017 1340   PLT 81 (L) 07/25/2018 1529   PLT 163 05/24/2018 0905   PLT 212 11/24/2017 1340   MCV 97.0 07/25/2018 1529   MCV 97.0 11/24/2017 1340   MCH 32.8 07/25/2018 1529   MCHC 33.8  07/25/2018 1529   RDW 19.6 (H) 07/25/2018 1529   RDW 14.1 11/24/2017 1340   LYMPHSABS 0.5 (L) 07/25/2018 1529   LYMPHSABS  1.4 11/24/2017 1340   MONOABS 0.2 07/25/2018 1529   MONOABS 0.3 11/24/2017 1340   EOSABS 0.0 07/25/2018 1529   EOSABS 0.1 11/24/2017 1340   BASOSABS 0.0 07/25/2018 1529   BASOSABS 0.1 11/24/2017 1340    . CMP Latest Ref Rng & Units 07/25/2018 07/21/2018 07/20/2018  Glucose 70 - 99 mg/dL 124(H) 172(H) 142(H)  BUN 8 - 23 mg/dL 13 20 16   Creatinine 0.44 - 1.00 mg/dL 0.85 0.72 1.02(H)  Sodium 135 - 145 mmol/L 137 141 137  Potassium 3.5 - 5.1 mmol/L 4.7 4.2 5.0  Chloride 98 - 111 mmol/L 104 108 106  CO2 22 - 32 mmol/L 22 21(L) 15(L)  Calcium 8.9 - 10.3 mg/dL 8.3(L) 7.4(L) 7.5(L)  Total Protein 6.5 - 8.1 g/dL 6.0(L) 5.1(L) -  Total Bilirubin 0.3 - 1.2 mg/dL 6.1(HH) 4.0(H) -  Alkaline Phos 38 - 126 U/L 565(H) 367(H) -  AST 15 - 41 U/L 252(HH) 275(H) -  ALT 0 - 44 U/L 159(H) 135(H) -       07/18/18 Pathology:          RADIOGRAPHIC STUDIES: I have personally reviewed the radiological images as listed and agreed with the findings in the report. Ct Head W Or Wo Contrast  Result Date: 07/20/2018 CLINICAL DATA:  New onset witnessed seizures. History of metastatic breast cancer. EXAM: CT HEAD WITHOUT AND WITH CONTRAST TECHNIQUE: Contiguous axial images were obtained from the base of the skull through the vertex without and with intravenous contrast CONTRAST:  58m OMNIPAQUE IOHEXOL 300 MG/ML  SOLN COMPARISON:  MRI head January 22, 2017 FINDINGS: BRAIN: Irregular cystic and solid 1.8 x 2.4 x 3.9 cm LEFT frontal cortical based mass with potential dural component. Severe surrounding vasogenic edema with mass effect on LEFT frontal horn of the lateral ventricle. 5 mm resultant LEFT to RIGHT subfalcine herniation. Stable appearance of RIGHT posterior fossa 8 x 12 mm enhancing mass most compatible with meningioma. Similar characteristic 6 mm prepontine mass. No hydrocephalus.  Stable appearance of symmetric inferior cerebellar atrophy VASCULAR: Moderate calcific atherosclerosis of the carotid siphons. SKULL: No skull fracture. Sclerotic expanded LEFT parietooccipital calvarium consistent with metastatic disease. 2.3 cm sclerotic metastasis RIGHT frontal calvarium. No significant scalp soft tissue swelling. SINUSES/ORBITS: Severe frontal sinusitis. Mastoid air cells are well aerated.The included ocular globes and orbital contents are non-suspicious. OTHER: None. IMPRESSION: 1. 1.8 x 2.4 x 3.9 cm LEFT frontal cortical based mass with possible dural component highly concerning for metastatic disease, less likely abscess or cerebritis. Severe vasogenic edema with 5 mm LEFT to RIGHT subfalcine herniation. No ventricular entrapment. Recommend MRI of the brain with without contrast. 2. 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 3. Multiple calvarial metastasis. 4. Cerebellar atrophy. 5. Acute findings discussed with and reconfirmed by Dr.ELLIOTT WENTZ on 07/20/2018 at 9:41 pm. Electronically Signed   By: CElon AlasM.D.   On: 07/20/2018 21:42   Ct Chest W Contrast  Result Date: 07/06/2018 CLINICAL DATA:  Metastatic breast carcinoma. Recent episodes of vomiting. Some weight loss. EXAM: CT CHEST, ABDOMEN, AND PELVIS WITH CONTRAST TECHNIQUE: Multidetector CT imaging of the chest, abdomen and pelvis was performed following the standard protocol during bolus administration of intravenous contrast. CONTRAST:  1032mOMNIPAQUE IOHEXOL 300 MG/ML  SOLN COMPARISON:  CT 02/22/2018 FINDINGS: CT CHEST FINDINGS Cardiovascular: No significant vascular findings. Normal heart size. No pericardial effusion. Mediastinum/Nodes: Axillary supraclavicular adenopathy. No mediastinal hilar adenopathy. No pericardial effusion. Esophagus normal. Lungs/Pleura: No  suspicious pulmonary nodules Musculoskeletal: Multiple sclerotic lesions spine and ribs unchanged CT ABDOMEN  AND PELVIS FINDINGS Hepatobiliary: There is new infiltrative pattern of enhancing lesions involving large portion of the central liver (segment 5, segment 8, 4A, and 4 B). This is in a region of a previously enhancing lesion in the RIGHT hepatic lobe which is now difficult define on the background this new infiltrative process. There are several discrete lesions including a 2.5 cm peripheral enhancing lesion in the central RIGHT hepatic lobe (image 47/2). More peripheral subcapsular lesions in the dome of liver with irregular peripheral enhancement measured 2.1 x 0.8 cm on image 42/2 and 1.6 by 2.3 cm on image 38/2. The infiltrative mass like pattern involves broad segment measuring 18 x 8 cm on image 52/2 for example. Very small amount of fluid along the RIGHT hepatic lobe and paraspinal liver no biliary duct dilatation. Portal veins patent. Postcholecystectomy. Pancreas: Pancreas is normal. No ductal dilatation. No pancreatic inflammation. Spleen: Small low-density lesions spleen is favored benign cysts. Adrenals/urinary tract: Adrenal glands, kidneys, ureters and bladder normal. Stomach/Bowel: Stomach, small bowel, appendix, and cecum are normal. The colon and rectosigmoid colon are normal. Vascular/Lymphatic: Abdominal aorta is normal caliber. There is no retroperitoneal or periportal lymphadenopathy. No pelvic lymphadenopathy. Reproductive: Uterus normal. Other: Small free fluid the pelvis. Musculoskeletal: Multiple densely sclerotic lesions in the pelvis and spine are not changed from comparison exam IMPRESSION: Chest Impression: 1. No evidence pulmonary metastasis or lymphadenopathy. 2. Stable dense sclerotic skeletal metastasis. Abdomen / Pelvis Impression: 1. New pattern of heterogeneous enhancement and enhancing ill-defined lesions in the central LEFT and RIGHT hepatic lobe at site prior enhancing lesion is highly concerning for progression of of infiltrative malignancy in the liver occupying a large  portion of the RIGHT hepatic lobe and central LEFT hepatic lobe. 2. Small amount of free fluid in the abdomen pelvis. 3. No metastatic adenopathy. 4. Stable  skeletal sclerotic metastasis. These results will be called to the ordering clinician or representative by the Radiologist Assistant, and communication documented in the PACS or zVision Dashboard. Electronically Signed   By: Suzy Bouchard M.D.   On: 07/06/2018 14:37   Mr Jeri Cos VE Contrast  Result Date: 07/21/2018 CLINICAL DATA:  62 y/o F; metastatic breast cancer presenting with seizure. EXAM: MRI HEAD WITHOUT AND WITH CONTRAST TECHNIQUE: Multiplanar, multiecho pulse sequences of the brain and surrounding structures were obtained without and with intravenous contrast. CONTRAST:  77m MULTIHANCE GADOBENATE DIMEGLUMINE 529 MG/ML IV SOLN COMPARISON:  07/20/2018 CT head FINDINGS: Brain: Plaque-like irregular enhancing mass with broad base to the dura measuring 5.1 x 2.2 x 2.4 cm. The mass likely originates from the dura with invasion of the underlying left frontal lobe. There is diffuse smooth dural thickening over the left cerebral convexity which may represent associated edema and/or invasive neoplasm. There is T2 FLAIR signal abnormality compatible with edema throughout the surrounding left frontal lobe extending into the anterior insula. Mass effect from the lesion and brain edema partially effaces the frontal horn of left lateral ventricle and results in 7 mm of left-to-right midline shift of the anterior septum pellucidum. No herniation. Asymmetric cerebellar volume loss. No reduced diffusion to suggest acute or early subacute infarction. No additional focus of abnormal enhancement of the brain. No abnormal susceptibility hypointensity to indicate hemorrhage. Vascular: Normal flow voids. Skull and upper cervical spine: There is confluent low signal in bone marrow throughout the left frontal bone, occipital bone, right temporal squamous likely  representing osseous metastatic  disease. Sinuses/Orbits: Negative. Other: None. IMPRESSION: 1. Irregular plaque-like mass, likely metastasis, measuring up to 5.1 cm, appears to originate from the left anterolateral frontal dura with invasion of the underlying frontal lobe of the brain. Mass effect from brain edema and the lesion partially effaces the frontal horn of left lateral ventricle and results in 7 mm left-to-right midline shift of anterior septum pellucidum. 2. Diffuse dural thickening over the left cerebral convexity may represent edema associated with the tumor or invasive neoplasm. 3. 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 This may be due to alcoholic cerebellar degeneration, phenytoin/lithium toxicity, chronic sequelae of cerebellitis, associated with an inherited ataxia syndrome, or represent a neurodegenerate disorder such as multi-system atrophy or olivopontocerebellar atrophy. Electronically Signed   By: Kristine Garbe M.D.   On: 07/21/2018 01:14   Ct Abdomen Pelvis W Contrast  Result Date: 07/06/2018 CLINICAL DATA:  Metastatic breast carcinoma. Recent episodes of vomiting. Some weight loss. EXAM: CT CHEST, ABDOMEN, AND PELVIS WITH CONTRAST TECHNIQUE: Multidetector CT imaging of the chest, abdomen and pelvis was performed following the standard protocol during bolus administration of intravenous contrast. CONTRAST:  161m OMNIPAQUE IOHEXOL 300 MG/ML  SOLN COMPARISON:  CT 02/22/2018 FINDINGS: CT CHEST FINDINGS Cardiovascular: No significant vascular findings. Normal heart size. No pericardial effusion. Mediastinum/Nodes: Axillary supraclavicular adenopathy. No mediastinal hilar adenopathy. No pericardial effusion. Esophagus normal. Lungs/Pleura: No suspicious pulmonary nodules Musculoskeletal: Multiple sclerotic lesions spine and ribs unchanged CT ABDOMEN AND PELVIS FINDINGS Hepatobiliary: There is new infiltrative pattern of enhancing  lesions involving large portion of the central liver (segment 5, segment 8, 4A, and 4 B). This is in a region of a previously enhancing lesion in the RIGHT hepatic lobe which is now difficult define on the background this new infiltrative process. There are several discrete lesions including a 2.5 cm peripheral enhancing lesion in the central RIGHT hepatic lobe (image 47/2). More peripheral subcapsular lesions in the dome of liver with irregular peripheral enhancement measured 2.1 x 0.8 cm on image 42/2 and 1.6 by 2.3 cm on image 38/2. The infiltrative mass like pattern involves broad segment measuring 18 x 8 cm on image 52/2 for example. Very small amount of fluid along the RIGHT hepatic lobe and paraspinal liver no biliary duct dilatation. Portal veins patent. Postcholecystectomy. Pancreas: Pancreas is normal. No ductal dilatation. No pancreatic inflammation. Spleen: Small low-density lesions spleen is favored benign cysts. Adrenals/urinary tract: Adrenal glands, kidneys, ureters and bladder normal. Stomach/Bowel: Stomach, small bowel, appendix, and cecum are normal. The colon and rectosigmoid colon are normal. Vascular/Lymphatic: Abdominal aorta is normal caliber. There is no retroperitoneal or periportal lymphadenopathy. No pelvic lymphadenopathy. Reproductive: Uterus normal. Other: Small free fluid the pelvis. Musculoskeletal: Multiple densely sclerotic lesions in the pelvis and spine are not changed from comparison exam IMPRESSION: Chest Impression: 1. No evidence pulmonary metastasis or lymphadenopathy. 2. Stable dense sclerotic skeletal metastasis. Abdomen / Pelvis Impression: 1. New pattern of heterogeneous enhancement and enhancing ill-defined lesions in the central LEFT and RIGHT hepatic lobe at site prior enhancing lesion is highly concerning for progression of of infiltrative malignancy in the liver occupying a large portion of the RIGHT hepatic lobe and central LEFT hepatic lobe. 2. Small amount of  free fluid in the abdomen pelvis. 3. No metastatic adenopathy. 4. Stable  skeletal sclerotic metastasis. These results will be called to the ordering clinician or representative by the Radiologist Assistant, and communication documented in the PACS or zVision Dashboard. Electronically Signed   By: SNicole Kindred  Leonia Reeves M.D.   On: 07/06/2018 14:37   US Abdomen Limited  Result Date: 07/18/2018 CLINICAL DATA:  Liver lesion. Trace ascites noted on prior CT. Paracentesis requested in preparation for percutaneous liver lesion biopsy. EXAM: LIMITED ABDOMEN ULTRASOUND FOR ASCITES TECHNIQUE: Limited ultrasound survey for ascites was performed in all four abdominal quadrants. COMPARISON:  CT 07/06/2018 FINDINGS: Trace abdominal ascites. No adequate pocket for safe paracentesis, which was deferred. IMPRESSION: Trace abdominal ascites.  Paracentesis deferred. Electronically Signed   By: Lucrezia Europe M.D.   On: 07/18/2018 12:38   US Biopsy (liver)  Result Date: 07/18/2018 CLINICAL DATA:  Multiple liver lesions. History of breast carcinoma. EXAM: ULTRASOUND-GUIDED CORE LIVER BIOPSY TECHNIQUE: An ultrasound guided liver biopsy was thoroughly discussed with the patient and questions were answered. The benefits, risks, alternatives, and complications were also discussed. The patient understands and wishes to proceed with the procedure. A verbal as well as written consent was obtained. Survey ultrasound of the liver was performed, a representative lesion localized in the right lobe, and an appropriate skin entry site was determined. Skin site was marked, prepped with chlorhexidine, and draped in usual sterile fashion, and infiltrated locally with 1% lidocaine. Intravenous Fentanyl and Versed were administered as conscious sedation during continuous monitoring of the patient's level of consciousness and physiological / cardiorespiratory status by the radiology RN, with a total moderate sedation time of 10 minutes. A 17 gauge trocar  needle was advanced under ultrasound guidance into the liver to the margin of the lesion. 3 coaxial 18gauge core samples were then obtained through the guide needle. The guide needle was removed. Post procedure scans demonstrate no apparent complication. COMPLICATIONS: COMPLICATIONS None immediate FINDINGS: Multiple low-attenuation liver lesions were localized. Representative core biopsy samples obtained as above. IMPRESSION: 1. Technically successful ultrasound guided core liver lesion biopsy. Electronically Signed   By: Lucrezia Europe M.D.   On: 07/18/2018 16:53   Ir Imaging Guided Port Insertion  Result Date: 07/14/2018 CLINICAL DATA:  Metastatic breast cancer EXAM: RIGHT INTERNAL JUGULAR SINGLE LUMEN POWER PORT CATHETER INSERTION Date:  07/14/2018 07/14/2018 4:04 pm Radiologist:  Jerilynn Mages. Daryll Brod, MD Guidance:  Ultrasound fluoroscopic MEDICATIONS: Ancef 2 g; The antibiotic was administered within an appropriate time interval prior to skin puncture. ANESTHESIA/SEDATION: Versed 3.0 mg IV; Fentanyl 100 mcg IV; Moderate Sedation Time:  23 minutes The patient was continuously monitored during the procedure by the interventional radiology nurse under my direct supervision. FLUOROSCOPY TIME:  0 minutes, 36 seconds (5 mGy) COMPLICATIONS: None immediate. CONTRAST:  None. PROCEDURE: Informed consent was obtained from the patient following explanation of the procedure, risks, benefits and alternatives. The patient understands, agrees and consents for the procedure. All questions were addressed. A time out was performed. Maximal barrier sterile technique utilized including caps, mask, sterile gowns, sterile gloves, large sterile drape, hand hygiene, and 2% chlorhexidine scrub. Under sterile conditions and local anesthesia, right internal jugular micropuncture venous access was performed. Access was performed with ultrasound. Images were obtained for documentation of the patent right internal jugular vein. A guide wire was  inserted followed by a transitional dilator. This allowed insertion of a guide wire and catheter into the IVC. Measurements were obtained from the SVC / RA junction back to the right IJ venotomy site. In the right infraclavicular chest, a subcutaneous pocket was created over the second anterior rib. This was done under sterile conditions and local anesthesia. 1% lidocaine with epinephrine was utilized for this. A 2.5 cm incision was made in the skin. Blunt  dissection was performed to create a subcutaneous pocket over the right pectoralis major muscle. The pocket was flushed with saline vigorously. There was adequate hemostasis. The port catheter was assembled and checked for leakage. The port catheter was secured in the pocket with two retention sutures. The tubing was tunneled subcutaneously to the right venotomy site and inserted into the SVC/RA junction through a valved peel-away sheath. Position was confirmed with fluoroscopy. Images were obtained for documentation. The patient tolerated the procedure well. No immediate complications. Incisions were closed in a two layer fashion with 4 - 0 Vicryl suture. Dermabond was applied to the skin. The port catheter was accessed, blood was aspirated followed by saline and heparin flushes. Needle was removed. A dry sterile dressing was applied. IMPRESSION: Ultrasound and fluoroscopically guided right internal jugular single lumen power port catheter insertion. Tip in the SVC/RA junction. Catheter ready for use. Electronically Signed   By: Jerilynn Mages.  Shick M.D.   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 Multifocal tumor in the left breast with biopsy-proven left axillary metastases.   Noted to have extensive bone metastases and pulmonary metastases. Patient was noted to have calvarial metastases but no overt parenchymal metastasis She  has been noted to have good clinical response and her tumor marker levels of progressively normalized. 09/14/17 CT Chest/ad/pelvis Results discussed in details - good response.  02/22/18 CT revealed  Newly apparent 2.1 by 2.0 cm rim enhancing lesion in the right hepatic lobe suspicious for a metastatic lesion. This was not apparent on prior exams although the prior exams were all noncontrast and thus the lesion may have been present but with reduced conspicuity. There is also a small enhancing lesion further posteriorly in the right hepatic lobe which is technically nonspecific and could be a small hemangioma or a small metastatic lesion. Stable distribution and appearance of prior sclerotic osseous metastatic disease without bony progression. 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   07/06/18 CT C/A/P which revealed New pattern of heterogeneous enhancement and enhancing ill-defined lesions in the central LEFT and RIGHT hepatic lobe at site prior enhancing lesion is highly concerning for progression of of infiltrative malignancy in the liver occupying a large portion of the RIGHT hepatic lobe and central LEFT hepatic lobe. 2. Small amount of free fluid in the abdomen pelvis. 3. No metastatic adenopathy.4. No evidence pulmonary metastasis or lymphadenopathy. 5. 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 Much improved back pain.  # 3 Neutropenia Related to her Ribociclib - resolved. Patient is currently on Verzenio and has not developed any neutropenia. This is being monitored.  #4 Increased LFTs due to new liver metastases  #5 Cancer related pain - controlled - status post palliative RT to spine - no currently needing any pain medications.  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 No other new medication. Now  resolved. Monitoring on increasing doses of Verzenio. No issues with recurrent rash on Verzenio 170m po BID   PLAN:   -Discussed pt labwork today, 07/25/18; liver functions continuing to worsen with AST to 252, ALT to 159, Alk Phos at 565 and Total Bilirubin to 6.1.  -Discussed the 07/18/18  Liver needle/core biopsy right lesion with Ki67 at 40%, ER/PR +ve -Discussed the 07/20/18 CT Head which revealed 1.8 x 2.4 x 3.9 cm LEFT frontal cortical based mass with possible dural component highly concerning for metastatic disease, less likely abscess or cerebritis. Severe vasogenic edema with 5 mm LEFT to RIGHT subfalcine herniation. No ventricular entrapment. 2. 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 3. Multiple calvarial metastasis. 4. Cerebellar atrophy.  -Discussed the 07/21/18 which revealed  Irregular plaque-like mass, likely metastasis, measuring up to 5.1 cm, appears to originate from the left anterolateral frontal dura with invasion of the underlying frontal lobe of the brain. Mass effect from brain edema and the lesion partially effaces the frontal horn of left lateral ventricle and results in 7 mm left-to-right midline shift of anterior septum pellucidum. 2. Diffuse dural thickening over the left cerebral convexity may represent edema associated with the tumor or invasive neoplasm. 3. 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 This may be due to alcoholic cerebellar degeneration, phenytoin/lithium toxicity, chronic sequelae of cerebellitis, associated with an inherited ataxia syndrome, or represent a neurodegenerate disorder such as multi-system atrophy or olivopontocerebellar atrophy -Discussed that her worsening liver functions are concerning for an obstructive component, suggestive of US liver or MRCP and stent placement if possible . If no stentable obstruction - if LFTs remain abnormal will need to consider  switching AC to carboplatin. -Discussed my recommendation for beginning Faslodex every 2 weeks for the first 3 doses, then once a month -Avoid tylenol  -Proceed with MRI Brain on 07/28/18 as scheduled  -Continue follow up with Dr. Mickeal Skinner in Neuro-oncology and Dr. Tammi Klippel in [place] taking Claritin for Neulasta related pain -Continue with Delton See every 4 weeks - hold 1 dose -The pt has no prohibitive toxicities from beginning C1 of Doxorubicin, Cyclophosphamide and Paclitaxel at this time.   -Advised that the pt continue eating well   #8 Vitamin D deficiency Vit D levels 23.1 on 3/13. Back down to 19 Plan -Continue ergocalciferol at current dose. 50k units 3 times a week. Recommended she take this with fatty foods to improve absorption.   faslodex q2week x 3 doses then q4weekly - to start ASAP MRI/MRCP liver ASAP MRI brain as scheduled on 07/28/2018   All of the patients questions were answered with apparent satisfaction. The patient knows to call the clinic with any problems, questions or concerns.   The total time spent in the appt was 40 minutes and more than 50% was on counseling and direct patient cares.     Sullivan Lone MD MS AAHIVMS [facility] Hematology/Oncology Physician [facility]  (Office):       [phone] (Work cell):  [phone] (Fax):           [phone]  I, Baldwin Jamaica, am acting as a scribe for Dr. Irene Limbo  .I have reviewed the above documentation for accuracy and completeness, and I agree with the above. [name] MD

## 2018-07-25 NOTE — Telephone Encounter (Signed)
Dr. Irene Limbo made aware of abnormal lab values including AST/Bilirubin.

## 2018-07-26 ENCOUNTER — Telehealth: Payer: Self-pay | Admitting: Hematology

## 2018-07-26 NOTE — Telephone Encounter (Signed)
Called regarding 9/4 sch msg °

## 2018-07-27 ENCOUNTER — Other Ambulatory Visit: Payer: Self-pay | Admitting: Hematology

## 2018-07-27 ENCOUNTER — Inpatient Hospital Stay: Payer: BC Managed Care – PPO

## 2018-07-27 VITALS — BP 105/79 | HR 75 | Temp 98.9°F | Resp 18

## 2018-07-27 DIAGNOSIS — Z7189 Other specified counseling: Secondary | ICD-10-CM

## 2018-07-27 DIAGNOSIS — Z5111 Encounter for antineoplastic chemotherapy: Secondary | ICD-10-CM | POA: Diagnosis not present

## 2018-07-27 DIAGNOSIS — C787 Secondary malignant neoplasm of liver and intrahepatic bile duct: Secondary | ICD-10-CM

## 2018-07-27 DIAGNOSIS — Z95828 Presence of other vascular implants and grafts: Secondary | ICD-10-CM

## 2018-07-27 DIAGNOSIS — C7951 Secondary malignant neoplasm of bone: Secondary | ICD-10-CM

## 2018-07-27 MED ORDER — DENOSUMAB 120 MG/1.7ML ~~LOC~~ SOLN: 120.0000 mg | Freq: Once | SUBCUTANEOUS | Status: DC

## 2018-07-27 MED ORDER — FULVESTRANT 250 MG/5ML IM SOLN
INTRAMUSCULAR | Status: AC
  Filled 2018-07-27: qty 5

## 2018-07-27 MED ORDER — FULVESTRANT 250 MG/5ML IM SOLN
500.0000 mg | Freq: Once | INTRAMUSCULAR | Status: AC
  Administered 2018-07-27: 500 mg via INTRAMUSCULAR

## 2018-07-27 NOTE — Progress Notes (Signed)
Stacey Cox put on hold today, Per Dr. Irene Limbo, Faslodex injection given. Pt. Tolerated injection well, No further problems or concerns noted.

## 2018-07-27 NOTE — Patient Instructions (Signed)

## 2018-07-28 ENCOUNTER — Ambulatory Visit (HOSPITAL_COMMUNITY)
Admission: RE | Admit: 2018-07-28 | Discharge: 2018-07-28 | Disposition: A | Discharge status: Home / Self Care | Payer: BC Managed Care – PPO | Source: Ambulatory Visit | Attending: Hematology | Admitting: Hematology

## 2018-07-28 ENCOUNTER — Ambulatory Visit (HOSPITAL_COMMUNITY)
Admission: RE | Admit: 2018-07-28 | Discharge: 2018-07-28 | Disposition: A | Discharge status: Home / Self Care | Payer: BC Managed Care – PPO | Source: Ambulatory Visit | Attending: Internal Medicine | Admitting: Internal Medicine

## 2018-07-28 ENCOUNTER — Other Ambulatory Visit: Payer: Self-pay | Admitting: Hematology

## 2018-07-28 DIAGNOSIS — F419 Anxiety disorder, unspecified: Secondary | ICD-10-CM

## 2018-07-28 DIAGNOSIS — C50919 Malignant neoplasm of unspecified site of unspecified female breast: Secondary | ICD-10-CM

## 2018-07-28 DIAGNOSIS — C787 Secondary malignant neoplasm of liver and intrahepatic bile duct: Secondary | ICD-10-CM | POA: Diagnosis present

## 2018-07-28 DIAGNOSIS — C7901 Secondary malignant neoplasm of right kidney and renal pelvis: Secondary | ICD-10-CM | POA: Diagnosis not present

## 2018-07-28 DIAGNOSIS — C7931 Secondary malignant neoplasm of brain: Secondary | ICD-10-CM | POA: Insufficient documentation

## 2018-07-28 DIAGNOSIS — R188 Other ascites: Secondary | ICD-10-CM | POA: Insufficient documentation

## 2018-07-28 DIAGNOSIS — C7951 Secondary malignant neoplasm of bone: Secondary | ICD-10-CM | POA: Insufficient documentation

## 2018-07-28 MED ORDER — HEPARIN SOD (PORK) LOCK FLUSH 100 UNIT/ML IV SOLN
500.0000 [IU] | INTRAVENOUS | Status: AC | PRN
  Administered 2018-07-28: 500 [IU]

## 2018-07-28 MED ORDER — GADOBUTROL 1 MMOL/ML IV SOLN
7.0000 mL | Freq: Once | INTRAVENOUS | Status: AC | PRN
  Administered 2018-07-28: 7 mL via INTRAVENOUS

## 2018-07-28 MED ORDER — LORAZEPAM 1 MG PO TABS
ORAL_TABLET | ORAL | Status: AC
  Filled 2018-07-28: qty 1

## 2018-07-28 MED ORDER — LORAZEPAM 1 MG PO TABS
1.0000 mg | ORAL_TABLET | Freq: Once | ORAL | Status: AC | PRN
  Administered 2018-07-28: 1 mg via ORAL

## 2018-07-28 NOTE — Progress Notes (Signed)
Disability successfully faxed to Shane Crutch at [facility] at [phone]. Mailed copy to patient address on file.

## 2018-07-28 NOTE — Progress Notes (Unsigned)
Lorazepam ° °

## 2018-07-28 NOTE — Progress Notes (Signed)
PO ativan ordered for patient for anxiety before exam. Patient has driver, Elyse Hsu, that is driving home post administration that is bedside with patient.

## 2018-07-31 ENCOUNTER — Other Ambulatory Visit: Payer: BC Managed Care – PPO

## 2018-07-31 ENCOUNTER — Telehealth: Payer: Self-pay | Admitting: *Deleted

## 2018-07-31 NOTE — Telephone Encounter (Signed)
9:55 am  TC from patient with questions regarding new skin breakdown on her leg(s) and arms.  She said they look like scratches but doesn't remember doing anything that would make a scratch on her leg/arm. Also, she asked if brushing her hair can cause the cancer in her brain.  Assured her that brushing her hair is not a cause for brain cancer or the spread of her breast cancer to her brain.  In addition, she would like to know when she will see Dr. Mickeal Skinner again for follow up appt.

## 2018-08-01 ENCOUNTER — Other Ambulatory Visit: Payer: Self-pay | Admitting: Hematology

## 2018-08-01 ENCOUNTER — Other Ambulatory Visit: Payer: Self-pay

## 2018-08-01 ENCOUNTER — Inpatient Hospital Stay (HOSPITAL_BASED_OUTPATIENT_CLINIC_OR_DEPARTMENT_OTHER): Payer: BC Managed Care – PPO | Admitting: Internal Medicine

## 2018-08-01 ENCOUNTER — Telehealth: Payer: Self-pay

## 2018-08-01 ENCOUNTER — Encounter: Payer: Self-pay | Admitting: Internal Medicine

## 2018-08-01 VITALS — BP 107/75 | HR 86 | Temp 98.5°F | Resp 18 | Ht 65.0 in | Wt 158.7 lb

## 2018-08-01 DIAGNOSIS — C7951 Secondary malignant neoplasm of bone: Secondary | ICD-10-CM

## 2018-08-01 DIAGNOSIS — Z5111 Encounter for antineoplastic chemotherapy: Secondary | ICD-10-CM | POA: Diagnosis not present

## 2018-08-01 DIAGNOSIS — C7931 Secondary malignant neoplasm of brain: Secondary | ICD-10-CM | POA: Diagnosis not present

## 2018-08-01 DIAGNOSIS — R569 Unspecified convulsions: Secondary | ICD-10-CM

## 2018-08-01 DIAGNOSIS — C50812 Malignant neoplasm of overlapping sites of left female breast: Secondary | ICD-10-CM

## 2018-08-01 DIAGNOSIS — C787 Secondary malignant neoplasm of liver and intrahepatic bile duct: Secondary | ICD-10-CM | POA: Diagnosis not present

## 2018-08-01 NOTE — Progress Notes (Signed)
Location/Histology of Brain Tumor: Left breast cancer  with left axillary and subpectoral lymphadenopathy, significant osseous metastases, pulmonary metastases. Also with osseous skull metastases, including a large left occipital lobe lesion, now left frontal lobe compatible with metastatic disease.   Patient presented with symptoms of:MRI w wo contrast 07-28-18 showed  extensive nodular dural enhancement left frontal lobe with associated enhancing mass growing in the left frontal lobe is unchanged in size and compatible with metastatic disease.  This is associated with metastatic disease to the left frontal bone. Extensive edema in the left frontal lobe has progressed in the interval.  Past or anticipated interventions, if any, per neurosurgery:   Past or anticipated interventions, if any, per medical oncology: Dr. Norville Haggard  Dr. Irene Limbo and Dr.Vaslow  07-28-18 MR Brain W WO Contrast Vaslow IMPRESSION: Extensive nodular dural enhancement left frontal lobe with associated enhancing mass growing in the left frontal lobe is unchanged in size and compatible with metastatic disease. This is associated with metastatic disease to the left frontal bone. Extensive edema in the left frontal lobe has progressed in the interval.  Blastic metastatic disease throughout the calvarium. Metastatic disease in the cervical spine.  Enhancing mass along the floor of the posterior fossa on the right is stable and most consistent with meningioma. 6 mm enhancing mass in the prepontine cistern on the right also unchanged and compatible with meningioma.   Electronically Signed   By: Franchot Gallo M.D.   On: 07/28/2018 17:25    07/13/2018 -  Chemotherapy     The patient had DOXOrubicin (ADRIAMYCIN) chemo injection 36 mg, 20 mg/m2 = 36 mg (66.7 % of original dose 30 mg/m2), Intravenous,  Once, 1 of 4 cycles Dose modification: 30 mg/m2 (original dose 30 mg/m2, Cycle 1, Reason: Change in  LFTs), 20 mg/m2 (original dose 30 mg/m2, Cycle 1, Reason: Change in LFTs) Administration: 36 mg (07/19/2018) palonosetron (ALOXI) injection 0.25 mg, 0.25 mg, Intravenous,  Once, 1 of 4 cycles Administration: 0.25 mg (07/19/2018) pegfilgrastim-cbqv (UDENYCA) injection 6 mg, 6 mg, Subcutaneous, Once, 1 of 4 cycles cyclophosphamide (CYTOXAN) 720 mg in sodium chloride 0.9 % 250 mL chemo infusion, 400 mg/m2 = 720 mg (100 % of original dose 400 mg/m2), Intravenous,  Once, 1 of 4 cycles Dose modification: 400 mg/m2 (original dose 400 mg/m2, Cycle 1, Reason: Change in LFTs) Administration: 720 mg (07/19/2018) fosaprepitant (EMEND) 150 mg, dexamethasone (DECADRON) 12 mg in sodium chloride 0.9 % 145 mL IVPB, , Intravenous,  Once, 1 of 4 cycles Administration:  (07/19/2018)  for chemotherapy treatment.      IMPRESSION:07-28-18  MR Abdomen MRCP W WO Contrast  Dr. Irene Limbo 1. Extensive hepatic metastatic disease, possibly progressive from recent abdominal CT. 2. No evidence of intrahepatic or extrahepatic biliary dilatation. Elevated liver function studies may be secondary to intrahepatic cholestasis. 3. Increased ascites without peritoneal nodularity or suspicious enhancement. 4. Grossly stable widespread osseous metastatic disease.   Electronically Signed   By: Richardean Sale M.D.   On: 07/28/2018 15:48   Dose of Decadron, if applicable: 4 mg po daily      Thrush  Recent neurologic symptoms, if any:   Seizures: Yes  Had a hospital admission 07-20-18  Headaches: Reports tension headache last night  Nausea:Yes had decreased scop. Patch is helping  Dizziness/ataxia: Yes when she stands to fast  Difficulty with hand coordination:No  Focal numbness/weakness: Bilateral thigh weakness improving.  Visual deficits/changes: Yes a little bit of blurred vision long distance. Wears glass  Confusion/Memory deficits: Family  says she is a little forgetful  Painful bone metastases at present, if  any: Grossly stable widespread osseous metastatic disease. cervical spine at C3, C4, and C5.  SAFETY ISSUES:  Prior radiation? 02-07-17 to 02-18-17   L-Spine was treated to 30 Gy in 10 fractions at 3 Gy       Pacemaker/ICD? No  Possible current pregnancy? No  Is the patient on methotrexate? No  Additional Complaints / other details: :62 year old female. Lives alone.  Mother and sister with history of breast ca. 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 Phobia of the medical system. Wt Readings from Last 3 Encounters:  08/03/18 157 lb 9.6 oz (71.5 kg)  08/01/18 158 lb 11.2 oz (72 kg)  07/25/18 162 lb 3.2 oz (73.6 kg)  BP 110/69 (BP Location: Right Arm, Patient Position: Sitting)   Pulse 91   Temp 98.3 F (36.8 C) (Oral)   Resp 20   Ht 5\' 5"  (1.651 m)   Wt 157 lb 9.6 oz (71.5 kg)   SpO2 100%   BMI 26.23 kg/m

## 2018-08-01 NOTE — Progress Notes (Signed)
[facility] [place], [place] [address] [phone]   New Patient Evaluation  Date of Service: 08/01/18 Patient Name: Stacey Cox Patient MRN: [id number] Patient DOB: 12-24-1955 Provider: Ventura Sellers, MD  Identifying Statement:  Abiageal Blowe is a 62 y.o. female with Brain metastases ([place]) [C79.31] who presents for initial consultation and evaluation regarding cancer associated neurologic deficits.    Referring Provider: [name], MD [address] [place], [place] [address]  Oncologic History:   Metastatic breast cancer [facility])   01/21/2017 Initial Diagnosis    Metastatic breast cancer ([place])    07/13/2018 -  Chemotherapy    The patient had DOXOrubicin (ADRIAMYCIN) chemo injection 36 mg, 20 mg/m2 = 36 mg (66.7 % of original dose 30 mg/m2), Intravenous,  Once, 1 of 4 cycles Dose modification: 30 mg/m2 (original dose 30 mg/m2, Cycle 1, Reason: Change in LFTs), 20 mg/m2 (original dose 30 mg/m2, Cycle 1, Reason: Change in LFTs) Administration: 36 mg (07/19/2018) palonosetron (ALOXI) injection 0.25 mg, 0.25 mg, Intravenous,  Once, 1 of 4 cycles Administration: 0.25 mg (07/19/2018) pegfilgrastim-cbqv (UDENYCA) injection 6 mg, 6 mg, Subcutaneous, Once, 1 of 4 cycles cyclophosphamide (CYTOXAN) 720 mg in sodium chloride 0.9 % 250 mL chemo infusion, 400 mg/m2 = 720 mg (100 % of original dose 400 mg/m2), Intravenous,  Once, 1 of 4 cycles Dose modification: 400 mg/m2 (original dose 400 mg/m2, Cycle 1, Reason: Change in LFTs) Administration: 720 mg (07/19/2018) fosaprepitant (EMEND) 150 mg, dexamethasone (DECADRON) 12 mg in sodium chloride 0.9 % 145 mL IVPB, , Intravenous,  Once, 1 of 4 cycles Administration:  (07/19/2018)  for chemotherapy treatment.      Liver metastases ([place])   07/12/2018 Initial Diagnosis    Liver metastases ([place])    07/13/2018 -  Chemotherapy    The patient had DOXOrubicin (ADRIAMYCIN) chemo  injection 36 mg, 20 mg/m2 = 36 mg (66.7 % of original dose 30 mg/m2), Intravenous,  Once, 1 of 4 cycles Dose modification: 30 mg/m2 (original dose 30 mg/m2, Cycle 1, Reason: Change in LFTs), 20 mg/m2 (original dose 30 mg/m2, Cycle 1, Reason: Change in LFTs) Administration: 36 mg (07/19/2018) palonosetron (ALOXI) injection 0.25 mg, 0.25 mg, Intravenous,  Once, 1 of 4 cycles Administration: 0.25 mg (07/19/2018) pegfilgrastim-cbqv (UDENYCA) injection 6 mg, 6 mg, Subcutaneous, Once, 1 of 4 cycles cyclophosphamide (CYTOXAN) 720 mg in sodium chloride 0.9 % 250 mL chemo infusion, 400 mg/m2 = 720 mg (100 % of original dose 400 mg/m2), Intravenous,  Once, 1 of 4 cycles Dose modification: 400 mg/m2 (original dose 400 mg/m2, Cycle 1, Reason: Change in LFTs) Administration: 720 mg (07/19/2018) fosaprepitant (EMEND) 150 mg, dexamethasone (DECADRON) 12 mg in sodium chloride 0.9 % 145 mL IVPB, , Intravenous,  Once, 1 of 4 cycles Administration:  (07/19/2018)  for chemotherapy treatment.      History of Present Illness: The patient's records from the referring physician were obtained and reviewed and the patient interviewed to confirm this HPI.  Pebble Botkin presented last week with two witnessed episodes of generalized shaking, eyes open, consistent with generalized seizure.  She had just two days prior been dosed with first cycle of doxorubicin and cyclophosphamide for progressive hormone positive breast cancer.  She denied any neurologic symptoms prior to the seizures, and after a day inpatient was back to her baseline.  MRI did demonstrate a metastatic focus with an enhancing dural tail.  She was started on decadron 4mg  daily and had repeat  MRI few days ago.  At this time she has no neurologic complaints.    Medications: Current Outpatient Medications on File Prior to Visit  Medication Sig Dispense Refill  . aspirin EC 81 MG tablet Take 1 tablet (81 mg total) by mouth daily.    [name] dexamethasone (DECADRON) 4  MG tablet Take 1 tablet (4 mg total) by mouth daily. 30 tablet 0  . escitalopram (LEXAPRO) 20 MG tablet TAKE 1 TABLET(20 MG) BY MOUTH DAILY (Patient taking differently: Take 20 mg by mouth daily as needed (anxiety). ) 30 tablet 0  . levETIRAcetam (KEPPRA) 750 MG tablet Take 1 tablet (750 mg total) by mouth 2 (two) times daily. 60 tablet 0  . potassium chloride SA (K-DUR,KLOR-CON) 20 MEQ tablet Take 1 tablet (20 mEq total) by mouth 2 (two) times daily. 30 tablet 0  . TRANSDERM-SCOP, 1.5 MG, 1 MG/3DAYS PLACE ONE PATCH ONTO THE SKIN EVERY THREE DAYS. 10 patch 0  . Vitamin D, Ergocalciferol, (DRISDOL) 50000 units CAPS capsule TAKE 1 CAPSULE BY MOUTH 3 TIMES WEEKLY 24 capsule 0  . feeding supplement, ENSURE ENLIVE, (ENSURE ENLIVE) LIQD Take 237 mLs by mouth 2 (two) times daily between meals. (Patient not taking: Reported on 08/01/2018) 237 mL 12  . hydrOXYzine (ATARAX/VISTARIL) 10 MG tablet Take 2.5 tablets (25 mg total) by mouth 2 (two) times daily as needed for nausea or vomiting. (Patient not taking: Reported on 08/01/2018) 30 tablet 1  . lidocaine-prilocaine (EMLA) cream Apply to affected area once (Patient not taking: Reported on 08/01/2018) 30 g 3  . loperamide (IMODIUM) 1 MG/5ML solution Take 2 mg by mouth as needed for diarrhea or loose stools.     [name] LORazepam (ATIVAN) 0.5 MG tablet Take 1 tablet (0.5 mg total) by mouth every 6 (six) hours as needed. for anxiety (Patient not taking: Reported on 08/01/2018) 60 tablet 0  . ondansetron (ZOFRAN) 4 MG tablet Take 1 tablet (4 mg total) by mouth 2 (two) times daily. Take with Verzenio. (Patient not taking: Reported on 08/01/2018) 60 tablet 0  . pantoprazole (PROTONIX) 40 MG tablet Take 1 tablet (40 mg total) by mouth daily. (Patient not taking: Reported on 08/01/2018) 30 tablet 0   No current facility-administered medications on file prior to visit.     Allergies:  Allergies  Allergen Reactions  . Thorazine [Chlorpromazine] Anaphylaxis  . Ribociclib      Exfoliative dermatitis   Past Medical History:  Past Medical History:  Diagnosis Date  . Cancer [facility])    Metastatic Breast Cancer   Past Surgical History:  Past Surgical History:  Procedure Laterality Date  . CHOLECYSTECTOMY    . IR IMAGING GUIDED PORT INSERTION  07/14/2018  . TONSILLECTOMY     Social History:  Social History   Socioeconomic History  . Marital status: Single    Spouse name: Not on file  . Number of children: Not on file  . Years of education: Not on file  . Highest education level: Not on file  Occupational History  . Not on file  Social Needs  . Financial resource strain: Not on file  . Food insecurity:    Worry: Not on file    Inability: Not on file  . Transportation needs:    Medical: Not on file    Non-medical: Not on file  Tobacco Use  . Smoking status: Former Smoker    Packs/day: 0.00    Types: Cigarettes    Last attempt to quit: 01/21/2017    Years  since quitting: 1.5  . Smokeless tobacco: Never Used  Substance and Sexual Activity  . Alcohol use: No    Alcohol/week: 0.0 standard drinks  . Drug use: No  . Sexual activity: Never  Lifestyle  . Physical activity:    Days per week: Not on file    Minutes per session: Not on file  . Stress: Not on file  Relationships  . Social connections:    Talks on phone: Not on file    Gets together: Not on file    Attends religious service: Not on file    Active member of club or organization: Not on file    Attends meetings of clubs or organizations: Not on file    Relationship status: Not on file  . Intimate partner violence:    Fear of current or ex partner: Not on file    Emotionally abused: Not on file    Physically abused: Not on file    Forced sexual activity: Not on file  Other Topics Concern  . Not on file  Social History Narrative  . Not on file   Family History:  Family History  Problem Relation Age of Onset  . Cancer Mother        breast  . Cancer Sister        breast     Review of Systems: Constitutional: Denies fevers, chills or abnormal weight loss Eyes: Denies blurriness of vision Ears, nose, mouth, throat, and face: Denies mucositis or sore throat Respiratory: Denies cough, dyspnea or wheezes Cardiovascular: Denies palpitation, chest discomfort or lower extremity swelling Gastrointestinal:  Denies nausea, constipation, diarrhea GU: Denies dysuria or incontinence Skin: Denies abnormal skin rashes Neurological: Per HPI Musculoskeletal: Denies joint pain, back or neck discomfort. No decrease in ROM Behavioral/Psych: Denies anxiety, disturbance in thought content, and mood instability   Physical Exam: Vitals:   08/01/18 1223  BP: 107/75  Pulse: 86  Resp: 18  Temp: 98.5 F (36.9 C)  SpO2: 100%   KPS: 90. General: Alert, cooperative, pleasant, in no acute distress Head: Craniotomy scar noted, dry and intact. EENT: No conjunctival injection or scleral icterus. Oral mucosa moist Lungs: Resp effort normal Cardiac: Regular rate and rhythm Abdomen: Soft, non-distended abdomen Skin: No rashes cyanosis or petechiae. Extremities: No clubbing or edema  Neurologic Exam: Mental Status: Awake, alert, attentive to examiner. Oriented to self and environment. Language is fluent with intact comprehension.  Cranial Nerves: Visual acuity is grossly normal. Visual fields are full. Extra-ocular movements intact. No ptosis. Face is symmetric, tongue midline. Motor: Tone and bulk are normal. Power is full in both arms and legs. Reflexes are symmetric, no pathologic reflexes present. Intact finger to nose bilaterally Sensory: Intact to light touch and temperature Gait: Normal and tandem gait is normal.   Labs: I have reviewed the data as listed    Component Value Date/Time   NA 137 07/25/2018 1529   NA 139 11/24/2017 1340   K 4.7 07/25/2018 1529   K 3.7 11/24/2017 1340   CL 104 07/25/2018 1529   CO2 22 07/25/2018 1529   CO2 25 11/24/2017 1340    GLUCOSE 124 (H) 07/25/2018 1529   GLUCOSE 83 11/24/2017 1340   BUN 13 07/25/2018 1529   BUN 9.7 11/24/2017 1340   CREATININE 0.85 07/25/2018 1529   CREATININE 1.2 (H) 11/24/2017 1340   CALCIUM 8.3 (L) 07/25/2018 1529   CALCIUM 9.3 11/24/2017 1340   PROT 6.0 (L) 07/25/2018 1529   PROT 7.1 11/24/2017 1340  ALBUMIN 2.4 (L) 07/25/2018 1529   ALBUMIN 3.8 11/24/2017 1340   AST 252 (HH) 07/25/2018 1529   AST 29 11/24/2017 1340   ALT 159 (H) 07/25/2018 1529   ALT 35 11/24/2017 1340   ALKPHOS 565 (H) 07/25/2018 1529   ALKPHOS 73 11/24/2017 1340   BILITOT 6.1 (HH) 07/25/2018 1529   BILITOT 0.40 11/24/2017 1340   GFRNONAA >60 07/25/2018 1529   GFRAA >60 07/25/2018 1529   Lab Results  Component Value Date   WBC 6.8 07/25/2018   NEUTROABS 6.0 07/25/2018   HGB 11.0 (L) 07/25/2018   HCT 32.5 (L) 07/25/2018   MCV 97.0 07/25/2018   PLT 81 (L) 07/25/2018    Imaging:  CLINICAL DATA:  Metastatic breast cancer. Recent steroid treatment. Chemotherapy, no radiation to the head.  EXAM: MRI HEAD WITHOUT AND WITH CONTRAST  TECHNIQUE: Multiplanar, multiecho pulse sequences of the brain and surrounding structures were obtained without and with intravenous contrast.  CONTRAST:  7 mL Gadovist IV  COMPARISON:  MRI head 07/21/2018  FINDINGS: Brain: Extensive dural nodular enhancement in the left frontal lobe compatible with metastatic disease. There is adjacent bony metastatic disease. Masslike enhancing tumor growing into the rib left frontal lobe measuring 4.7 x 2.0 cm appears unchanged. There is progressive white matter edema in the left frontal lobe. 5 mm midline shift to the right is similar. No hydrocephalus.  No other areas of mass or edema in the brain. There is marked cerebellar atrophy bilaterally. Negative for acute infarct.  Dural based enhancing mass in the posterior fossa on the right compatible with meningioma unchanged from 01/22/2017.  6 mm enhancing mass in  the prepontine cistern on the right also unchanged from 2018 and compatible with meningioma.  Vascular: Normal arterial flow void  Skull and upper cervical spine: Blastic metastatic disease throughout the calvarium mostly in the left frontal and parietal bones. Smaller area in the right anterior parietal bone. Metastatic disease in the cervical spine at C3, C4, and C5.  Sinuses/Orbits: Mucosal edema throughout the frontal sinuses and ethmoid sinuses. Normal orbit  Other: None  IMPRESSION: Extensive nodular dural enhancement left frontal lobe with associated enhancing mass growing in the left frontal lobe is unchanged in size and compatible with metastatic disease. This is associated with metastatic disease to the left frontal bone. Extensive edema in the left frontal lobe has progressed in the interval.  Blastic metastatic disease throughout the calvarium. Metastatic disease in the cervical spine.  Enhancing mass along the floor of the posterior fossa on the right is stable and most consistent with meningioma. 6 mm enhancing mass in the prepontine cistern on the right also unchanged and compatible with meningioma.   Electronically Signed   By: Franchot Gallo M.D.   On: 07/28/2018 17:25  [facility] Clinician Interpretation: I have personally reviewed the radiological images as listed.  My interpretation, in the context of the patient's clinical presentation, is stable disease   Assessment/Plan 1. Brain metastases ([place])  2. Seizures ([place])  Ms. Prevo is clinically stable today.  Her MRI re-demonstrated active focus of curvilinear enhancement along the convexity consistent with dural based metastasis from breast cancer.    Her case was discussed in multidiscplinary tumor board this week.  Persistence of dural enhancement despite steroids makes neoplasm more likely than inflammatory reaction in this location.  Recommendation was made for fractionated stereotactic  radiotherapy with Dr. Tammi Klippel.  The patient was agreeable to this plan.    She has not had further seizures and  should continue on current dose of Keppra 750mg  BID.  We recommended she reduce decadron to 2mg  daily as well, formal taper can begin after RT.  Will defer to Dr. Irene Limbo regarding timing of chemotherapy during RT.    We spent twenty additional minutes teaching regarding the natural history, biology, and historical experience in the treatment of neurologic complications of cancer. We also provided teaching sheets for the patient to take home as an additional resource.  We appreciate the opportunity to participate in the care of Royelle Hinchman.  She should return to clinic 3 months following radiation with MRI for review.  All questions were answered. The patient knows to call the clinic with any problems, questions or concerns. No barriers to learning were detected.  The total time spent in the encounter was 40 minutes and more than 50% was on counseling and review of test results   Ventura Sellers, MD Medical Director of Neuro-Oncology [facility] at [place] 08/01/18 3:13 PM

## 2018-08-01 NOTE — Telephone Encounter (Signed)
Printed avs and calender of upcoming appointment/. Per 9/10 los 

## 2018-08-02 ENCOUNTER — Other Ambulatory Visit: Payer: Self-pay | Admitting: Radiation Therapy

## 2018-08-02 ENCOUNTER — Other Ambulatory Visit: Payer: Self-pay | Admitting: Pharmacist

## 2018-08-02 DIAGNOSIS — C50919 Malignant neoplasm of unspecified site of unspecified female breast: Secondary | ICD-10-CM

## 2018-08-02 NOTE — Progress Notes (Signed)
Brain and Spine Tumor Board Documentation  Stacey Cox was presented by Cecil Cobbs, MD at Brain and Spine Tumor Board on 08/02/2018, which included representatives from neuro oncology, radiation oncology, surgical oncology, radiology, pathology, navigation, genetics.  Stacey Cox was presented as a new patient with history of the following treatments:  .  Additionally, we reviewed previous medical and familial history, history of present illness, and recent lab results along with all available histopathologic and imaging studies. The tumor board considered available treatment options and made the following recommendations:  Radiation therapy (primary modality)  Fractionated stereotactic radiotherapy vs IMRT to metastasis and dural tail  Tumor board is a meeting of clinicians from various specialty areas who evaluate and discuss patients for whom a multidisciplinary approach is being considered. Final determinations in the plan of care are those of the provider(s). The responsibility for follow up of recommendations given during tumor board is that of the provider.   Today's extended care, comprehensive team conference, Stacey Cox was not present for the discussion and was not examined.

## 2018-08-03 ENCOUNTER — Other Ambulatory Visit: Payer: Self-pay

## 2018-08-03 ENCOUNTER — Ambulatory Visit
Admission: RE | Admit: 2018-08-03 | Discharge: 2018-08-03 | Disposition: A | Discharge status: Home / Self Care | Payer: BC Managed Care – PPO | Source: Ambulatory Visit | Attending: Radiation Oncology | Admitting: Radiation Oncology

## 2018-08-03 ENCOUNTER — Ambulatory Visit
Admission: RE | Admit: 2018-08-03 | Discharge: 2018-08-03 | Disposition: A | Discharge status: Home / Self Care | Payer: BC Managed Care – PPO | Source: Ambulatory Visit | Attending: Urology | Admitting: Urology

## 2018-08-03 ENCOUNTER — Encounter: Payer: Self-pay | Admitting: Urology

## 2018-08-03 ENCOUNTER — Other Ambulatory Visit: Payer: Self-pay | Admitting: Hematology

## 2018-08-03 VITALS — BP 110/69 | HR 91 | Temp 98.3°F | Resp 20 | Ht 65.0 in | Wt 157.6 lb

## 2018-08-03 DIAGNOSIS — C7931 Secondary malignant neoplasm of brain: Secondary | ICD-10-CM | POA: Diagnosis not present

## 2018-08-03 DIAGNOSIS — C50912 Malignant neoplasm of unspecified site of left female breast: Secondary | ICD-10-CM | POA: Insufficient documentation

## 2018-08-03 DIAGNOSIS — C7951 Secondary malignant neoplasm of bone: Secondary | ICD-10-CM | POA: Diagnosis not present

## 2018-08-03 DIAGNOSIS — Z87891 Personal history of nicotine dependence: Secondary | ICD-10-CM | POA: Diagnosis not present

## 2018-08-03 DIAGNOSIS — R160 Hepatomegaly, not elsewhere classified: Secondary | ICD-10-CM | POA: Insufficient documentation

## 2018-08-03 DIAGNOSIS — Z79899 Other long term (current) drug therapy: Secondary | ICD-10-CM | POA: Diagnosis not present

## 2018-08-03 DIAGNOSIS — Z170 Estrogen receptor positive status [ER+]: Secondary | ICD-10-CM | POA: Insufficient documentation

## 2018-08-03 DIAGNOSIS — K760 Fatty (change of) liver, not elsewhere classified: Secondary | ICD-10-CM | POA: Insufficient documentation

## 2018-08-03 DIAGNOSIS — C778 Secondary and unspecified malignant neoplasm of lymph nodes of multiple regions: Secondary | ICD-10-CM | POA: Diagnosis not present

## 2018-08-03 DIAGNOSIS — Z7982 Long term (current) use of aspirin: Secondary | ICD-10-CM | POA: Insufficient documentation

## 2018-08-03 DIAGNOSIS — C787 Secondary malignant neoplasm of liver and intrahepatic bile duct: Secondary | ICD-10-CM | POA: Diagnosis not present

## 2018-08-03 DIAGNOSIS — Z803 Family history of malignant neoplasm of breast: Secondary | ICD-10-CM | POA: Insufficient documentation

## 2018-08-03 DIAGNOSIS — R188 Other ascites: Secondary | ICD-10-CM | POA: Diagnosis not present

## 2018-08-03 DIAGNOSIS — C7800 Secondary malignant neoplasm of unspecified lung: Secondary | ICD-10-CM | POA: Diagnosis not present

## 2018-08-03 MED ORDER — K-PHOS-NEUTRAL 155-852-130 MG PO TABS: 1.0000 | ORAL_TABLET | Freq: Two times a day (BID) | ORAL | 0 refills | Status: DC

## 2018-08-03 NOTE — Progress Notes (Signed)
[place]         [phone] ________________________________  Outpatient Re-Consultation Visit  Name: Stacey Cox MRN: [id number]  Date: 08/03/2018  DOB: 1956-10-27  REFERRING PHYSICIAN: [name], MD  DIAGNOSIS: 62 y.o. woman with symptomatic calvarial metastasis in the left frontal lobe secondary to metastatic breast cancer.    ICD-10-CM   1. Osseous metastasis ([place]) C79.51     HISTORY OF PRESENT ILLNESS::Stacey Cox is a 62 y.o. female who presents today at the request of Dr. Mickeal Skinner for new dural-based metastasis. 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 CT and MRI imaging at the time of admission/workup showed an ill-defined sclerotic lesion at L2 without pathologic fracture, mildly sclerotic as well as a mildly expansile lesion at LEFT L3 vertebral body with suspected pathologic fracture. She had an MRI of brain on 01/22/17 which revealed an 11 mm extra-axial mass in the posterior fossa favoring a small meningioma over metastasis and a 6 mm nodule was also discovered in the prepontine cistern, possibly an additional small meningioma. A large occipital lobe calvarial lesion was noted but no parenchymal disease.    The patient established care with Dr. Irene Limbo on 02/01/17 and received Xgeva injections every 4 weeks in addition to [place]. She received palliative radiation to her lumbar spine and sacrum from 02/07/2017 to 02/18/2017 and had dramatic improvement of her low back pain.   She initially showed good response to treatment until April 2019 CT showed new enhancing lesion in the right hepatic lobe. CT C/A/P on 07/06/2018 showed no evidence of pulmonary metastasis or lymphadenopathy and stable dense sclerotic skeletal metastasis. There was a new pattern of heterogeneous  enhancement and enhancing ill-defined lesions in the central left and right hepatic lobes. Prior enhancing lesion was highly concerning for progression of infiltrative malignancy in the liver occupying a large portion of the right hepatic lobe and central left hepatic lobe. No metastatic adenopathy in the abdomen/pelvis. Stable skeletal sclerotic metastasis.  Her systemic therapy was changed to doxorubicin and cyclophosphamide every 3 weeks, started on 07/13/2018. Her next cycle is next Wednesday 9/18. Verzenio and Femara were discontinued.  Biopsy of the right lesion of the liver on 07/18/2018 revealed metastatic carcinoma, consistent with primary ER/PR positive breast carcinoma.  She presented to the emergency department on 07/20/2018 with two witnessed seizure episodes. She denied any neurologic symptoms prior to the seizures, and after a day in the hospital was back to her baseline. Brain MRI at the time of admission demonstrated a metastatic focus with an enhancing dural tail. She was started on Decadron 4 mg daily. Repeat MRI of the brain on 07/28/2018 showed extensive nodular dural enhancement in the left frontal lobe with associated enhancing mass growing in the left frontal lobe, unchanged in size and compatible with metastatic disease. This is associated with metastatic disease to the left frontal bone. Extensive edema in the left frontal lobe has progressed. There is blastic metastatic disease throughout the calvarium, as well as metastatic disease in the cervical spine. Enhancing mass along the floor of the posterior fossa on the right is stable and most consistent with meningioma. 6 mm enhancing mass in the prepontine cistern on the right is also unchanged and compatible with meningioma.  The patient reviewed imaging results with Dr. Mickeal Skinner and has kindly been referred today for discussion of potential  radiation treatment options.    PREVIOUS RADIATION THERAPY: Yes  02/07/2017-02/18/2017:  The  L-Spine was treated to 30 Gy in 10 fractions at 3 Gy per fraction.   Past Medical History:  Diagnosis Date  . Cancer [facility])    Metastatic Breast Cancer  :   Past Surgical History:  Procedure Laterality Date  . CHOLECYSTECTOMY    . IR IMAGING GUIDED PORT INSERTION  07/14/2018  . TONSILLECTOMY    :   Current Outpatient Medications:  .  aspirin EC 81 MG tablet, Take 1 tablet (81 mg total) by mouth daily., Disp: , Rfl:  .  dexamethasone (DECADRON) 4 MG tablet, Take 1 tablet (4 mg total) by mouth daily. (Patient taking differently: Take 2 mg by mouth daily. ), Disp: 30 tablet, Rfl: 0 .  escitalopram (LEXAPRO) 20 MG tablet, TAKE 1 TABLET(20 MG) BY MOUTH DAILY (Patient taking differently: Take 20 mg by mouth daily as needed (anxiety). ), Disp: 30 tablet, Rfl: 0 .  feeding supplement, ENSURE ENLIVE, (ENSURE ENLIVE) LIQD, Take 237 mLs by mouth 2 (two) times daily between meals., Disp: 237 mL, Rfl: 12 .  levETIRAcetam (KEPPRA) 750 MG tablet, Take 1 tablet (750 mg total) by mouth 2 (two) times daily., Disp: 60 tablet, Rfl: 0 .  LORazepam (ATIVAN) 0.5 MG tablet, Take 1 tablet (0.5 mg total) by mouth every 6 (six) hours as needed. for anxiety, Disp: 60 tablet, Rfl: 0 .  potassium chloride SA (K-DUR,KLOR-CON) 20 MEQ tablet, Take 1 tablet (20 mEq total) by mouth 2 (two) times daily., Disp: 30 tablet, Rfl: 0 .  TRANSDERM-SCOP, 1.5 MG, 1 MG/3DAYS, PLACE ONE PATCH ONTO THE SKIN EVERY THREE DAYS., Disp: 10 patch, Rfl: 0 .  Vitamin D, Ergocalciferol, (DRISDOL) 50000 units CAPS capsule, TAKE 1 CAPSULE BY MOUTH 3 TIMES WEEKLY, Disp: 24 capsule, Rfl: 0 .  hydrOXYzine (ATARAX/VISTARIL) 10 MG tablet, Take 2.5 tablets (25 mg total) by mouth 2 (two) times daily as needed for nausea or vomiting. (Patient not taking: Reported on 08/01/2018), Disp: 30 tablet, Rfl: 1 .  lidocaine-prilocaine (EMLA) cream, Apply to affected area once (Patient not taking: Reported on 08/03/2018), Disp: 30 g, Rfl: 3 .  loperamide (IMODIUM) 1  MG/5ML solution, Take 2 mg by mouth as needed for diarrhea or loose stools. , Disp: , Rfl:  .  ondansetron (ZOFRAN) 4 MG tablet, Take 1 tablet (4 mg total) by mouth 2 (two) times daily. Take with Verzenio. (Patient not taking: Reported on 08/01/2018), Disp: 60 tablet, Rfl: 0 .  pantoprazole (PROTONIX) 40 MG tablet, Take 1 tablet (40 mg total) by mouth daily. (Patient not taking: Reported on 08/01/2018), Disp: 30 tablet, Rfl: 0:   Allergies  Allergen Reactions  . Thorazine [Chlorpromazine] Anaphylaxis  . Ribociclib     Exfoliative dermatitis  :   Family History  Problem Relation Age of Onset  . Cancer Mother        breast  . Cancer Sister        breast  :   Social History   Socioeconomic History  . Marital status: Single    Spouse name: Not on file  . Number of children: Not on file  . Years of education: Not on file  . Highest education level: Not on file  Occupational History  . Not on file  Social Needs  . Financial resource strain: Not on file  . Food insecurity:    Worry: Not on file    Inability: Not on file  . Transportation  needs:    Medical: Not on file    Non-medical: Not on file  Tobacco Use  . Smoking status: Former Smoker    Packs/day: 0.00    Types: Cigarettes    Last attempt to quit: 01/21/2017    Years since quitting: 1.5  . Smokeless tobacco: Never Used  Substance and Sexual Activity  . Alcohol use: No    Alcohol/week: 0.0 standard drinks  . Drug use: No  . Sexual activity: Never  Lifestyle  . Physical activity:    Days per week: Not on file    Minutes per session: Not on file  . Stress: Not on file  Relationships  . Social connections:    Talks on phone: Not on file    Gets together: Not on file    Attends religious service: Not on file    Active member of club or organization: Not on file    Attends meetings of clubs or organizations: Not on file    Relationship status: Not on file  . Intimate partner violence:    Fear of current or ex  partner: No    Emotionally abused: No    Physically abused: No    Forced sexual activity: No  Other Topics Concern  . Not on file  Social History Narrative   Lives alone. 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 Has phobia of the medical system.  :  REVIEW OF SYSTEMS:  On review of systems, the patient reports that she is doing okay overall. She denies any chest pain, shortness of breath, cough, fevers, chills, night sweats, or unintended weight changes. She denies any bowel or bladder disturbances, and denies abdominal pain, or vomiting. She reports nausea, improved with scopolamine patch. She denies any new musculoskeletal or joint aches or pains, new skin lesions or concerns. She states that she had seizures in her sleep and was hospitalized on 07/20/2018. She is taking Keppra and Decadron and reports her seizures have resolved.  She reports tension headaches periodically. She reports dizziness when she stands up too fast and bilateral thigh weakness that is improving. She reports far-sighted blurred vision and wears glasses. Her family states that she is forgetful. A complete review of systems is obtained and is otherwise negative.   PHYSICAL EXAM:  Blood pressure 110/69, pulse 91, temperature 98.3 F (36.8 C), temperature source Oral, resp. rate 20, height _0  (1.651 m), weight 157 lb 9.6 oz (71.5 kg), SpO2 100 %. In general this is a well appearing African-American female in no acute distress. She is alert and oriented x4 and appropriate throughout the examination. HEENT reveals that the patient is normocephalic, atraumatic. EOMs are intact. PERRLA. Skin is intact without any evidence of gross lesions. Cardiovascular exam reveals a regular rate and rhythm, no clicks rubs or murmurs are auscultated. Chest is clear to auscultation bilaterally. Lymphatic assessment is performed and does not reveal any adenopathy in the cervical, supraclavicular, axillary, or inguinal chains. Abdomen has active bowel sounds  in all quadrants and is intact. The abdomen is soft, non tender, non distended. Lower extremities are without cyanosis, clubbing or deep calf tenderness but show 1+ pitting edema bilaterally.  KPS = 80  100 - Normal; no complaints; no evidence of disease. 90   - Able to carry on normal activity; minor signs or symptoms of disease. 80   - Normal activity with effort; some signs or symptoms of disease. 77   - Cares for self; unable to carry on normal activity or to do  active work. 60   - Requires occasional assistance, but is able to care for most of his personal needs. 50   - Requires considerable assistance and frequent medical care. 52   - Disabled; requires special care and assistance. 75   - Severely disabled; hospital admission is indicated although death not imminent. 51   - Very sick; hospital admission necessary; active supportive treatment necessary. 10   - Moribund; fatal processes progressing rapidly. 0     - Dead  Karnofsky DA, Abelmann [place], Craver LS and Burchenal [facility] [phone]) The use of the nitrogen mustards in the palliative treatment of carcinoma: with particular reference to bronchogenic carcinoma Cancer 1 [id number]  LABORATORY DATA:  Lab Results  Component Value Date   WBC 6.8 07/25/2018   HGB 11.0 (L) 07/25/2018   HCT 32.5 (L) 07/25/2018   MCV 97.0 07/25/2018   PLT 81 (L) 07/25/2018   Lab Results  Component Value Date   NA 137 07/25/2018   K 4.7 07/25/2018   CL 104 07/25/2018   CO2 22 07/25/2018   Lab Results  Component Value Date   ALT 159 (H) 07/25/2018   AST 252 (HH) 07/25/2018   ALKPHOS 565 (H) 07/25/2018   BILITOT 6.1 (HH) 07/25/2018     RADIOGRAPHY: Ct Head W Or Wo Contrast  Result Date: 07/20/2018 CLINICAL DATA:  New onset witnessed seizures. History of metastatic breast cancer. EXAM: CT HEAD WITHOUT AND WITH CONTRAST TECHNIQUE: Contiguous axial images were obtained from the base of the skull through the vertex without and with intravenous contrast  CONTRAST:  85m OMNIPAQUE IOHEXOL 300 MG/ML  SOLN COMPARISON:  MRI head January 22, 2017 FINDINGS: BRAIN: Irregular cystic and solid 1.8 x 2.4 x 3.9 cm LEFT frontal cortical based mass with potential dural component. Severe surrounding vasogenic edema with mass effect on LEFT frontal horn of the lateral ventricle. 5 mm resultant LEFT to RIGHT subfalcine herniation. Stable appearance of RIGHT posterior fossa 8 x 12 mm enhancing mass most compatible with meningioma. Similar characteristic 6 mm prepontine mass. No hydrocephalus. Stable appearance of symmetric inferior cerebellar atrophy VASCULAR: Moderate calcific atherosclerosis of the carotid siphons. SKULL: No skull fracture. Sclerotic expanded LEFT parietooccipital calvarium consistent with metastatic disease. 2.3 cm sclerotic metastasis RIGHT frontal calvarium. No significant scalp soft tissue swelling. SINUSES/ORBITS: Severe frontal sinusitis. Mastoid air cells are well aerated.The included ocular globes and orbital contents are non-suspicious. OTHER: None. IMPRESSION: 1. 1.8 x 2.4 x 3.9 cm LEFT frontal cortical based mass with possible dural component highly concerning for metastatic disease, less likely abscess or cerebritis. Severe vasogenic edema with 5 mm LEFT to RIGHT subfalcine herniation. No ventricular entrapment. Recommend MRI of the brain with without contrast. 2. 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 3. Multiple calvarial metastasis. 4. Cerebellar atrophy. 5. Acute findings discussed with and reconfirmed by Dr.ELLIOTT WENTZ on 07/20/2018 at 9:41 pm. Electronically Signed   By: CElon AlasM.D.   On: 07/20/2018 21:42   Ct Chest W Contrast  Result Date: 07/06/2018 CLINICAL DATA:  Metastatic breast carcinoma. Recent episodes of vomiting. Some weight loss. EXAM: CT CHEST, ABDOMEN, AND PELVIS WITH CONTRAST TECHNIQUE: Multidetector CT imaging of the chest, abdomen and pelvis was performed following the  standard protocol during bolus administration of intravenous contrast. CONTRAST:  1096mOMNIPAQUE IOHEXOL 300 MG/ML  SOLN COMPARISON:  CT 02/22/2018 FINDINGS: CT CHEST FINDINGS Cardiovascular: No significant vascular findings. Normal heart size. No pericardial effusion. Mediastinum/Nodes: Axillary supraclavicular  adenopathy. No mediastinal hilar adenopathy. No pericardial effusion. Esophagus normal. Lungs/Pleura: No suspicious pulmonary nodules Musculoskeletal: Multiple sclerotic lesions spine and ribs unchanged CT ABDOMEN AND PELVIS FINDINGS Hepatobiliary: There is new infiltrative pattern of enhancing lesions involving large portion of the central liver (segment 5, segment 8, 4A, and 4 B). This is in a region of a previously enhancing lesion in the RIGHT hepatic lobe which is now difficult define on the background this new infiltrative process. There are several discrete lesions including a 2.5 cm peripheral enhancing lesion in the central RIGHT hepatic lobe (image 47/2). More peripheral subcapsular lesions in the dome of liver with irregular peripheral enhancement measured 2.1 x 0.8 cm on image 42/2 and 1.6 by 2.3 cm on image 38/2. The infiltrative mass like pattern involves broad segment measuring 18 x 8 cm on image 52/2 for example. Very small amount of fluid along the RIGHT hepatic lobe and paraspinal liver no biliary duct dilatation. Portal veins patent. Postcholecystectomy. Pancreas: Pancreas is normal. No ductal dilatation. No pancreatic inflammation. Spleen: Small low-density lesions spleen is favored benign cysts. Adrenals/urinary tract: Adrenal glands, kidneys, ureters and bladder normal. Stomach/Bowel: Stomach, small bowel, appendix, and cecum are normal. The colon and rectosigmoid colon are normal. Vascular/Lymphatic: Abdominal aorta is normal caliber. There is no retroperitoneal or periportal lymphadenopathy. No pelvic lymphadenopathy. Reproductive: Uterus normal. Other: Small free fluid the pelvis.  Musculoskeletal: Multiple densely sclerotic lesions in the pelvis and spine are not changed from comparison exam IMPRESSION: Chest Impression: 1. No evidence pulmonary metastasis or lymphadenopathy. 2. Stable dense sclerotic skeletal metastasis. Abdomen / Pelvis Impression: 1. New pattern of heterogeneous enhancement and enhancing ill-defined lesions in the central LEFT and RIGHT hepatic lobe at site prior enhancing lesion is highly concerning for progression of of infiltrative malignancy in the liver occupying a large portion of the RIGHT hepatic lobe and central LEFT hepatic lobe. 2. Small amount of free fluid in the abdomen pelvis. 3. No metastatic adenopathy. 4. Stable  skeletal sclerotic metastasis. These results will be called to the ordering clinician or representative by the Radiologist Assistant, and communication documented in the PACS or zVision Dashboard. Electronically Signed   By: Suzy Bouchard M.D.   On: 07/06/2018 14:37   Mr Jeri Cos DP Contrast  Result Date: 07/28/2018 CLINICAL DATA:  Metastatic breast cancer. Recent steroid treatment. Chemotherapy, no radiation to the head. EXAM: MRI HEAD WITHOUT AND WITH CONTRAST TECHNIQUE: Multiplanar, multiecho pulse sequences of the brain and surrounding structures were obtained without and with intravenous contrast. CONTRAST:  7 mL Gadovist IV COMPARISON:  MRI head 07/21/2018 FINDINGS: Brain: Extensive dural nodular enhancement in the left frontal lobe compatible with metastatic disease. There is adjacent bony metastatic disease. Masslike enhancing tumor growing into the rib left frontal lobe measuring 4.7 x 2.0 cm appears unchanged. There is progressive white matter edema in the left frontal lobe. 5 mm midline shift to the right is similar. No hydrocephalus. No other areas of mass or edema in the brain. There is marked cerebellar atrophy bilaterally. Negative for acute infarct. Dural based enhancing mass in the posterior fossa on the right compatible  with meningioma unchanged from 01/22/2017. 6 mm enhancing mass in the prepontine cistern on the right also unchanged from 2018 and compatible with meningioma. Vascular: Normal arterial flow void Skull and upper cervical spine: Blastic metastatic disease throughout the calvarium mostly in the left frontal and parietal bones. Smaller area in the right anterior parietal bone. Metastatic disease in the cervical spine at C3, C4,  and C5. Sinuses/Orbits: Mucosal edema throughout the frontal sinuses and ethmoid sinuses. Normal orbit Other: None IMPRESSION: Extensive nodular dural enhancement left frontal lobe with associated enhancing mass growing in the left frontal lobe is unchanged in size and compatible with metastatic disease. This is associated with metastatic disease to the left frontal bone. Extensive edema in the left frontal lobe has progressed in the interval. Blastic metastatic disease throughout the calvarium. Metastatic disease in the cervical spine. Enhancing mass along the floor of the posterior fossa on the right is stable and most consistent with meningioma. 6 mm enhancing mass in the prepontine cistern on the right also unchanged and compatible with meningioma. Electronically Signed   By: Franchot Gallo M.D.   On: 07/28/2018 17:25   Mr Jeri Cos TU Contrast  Addendum Date: 07/27/2018   ADDENDUM REPORT: 07/27/2018 15:04 ADDENDUM: Stable right CP angle 9 x 12 mm and right prepontine cistern 6 mm dural nodules (series 16, image 10 and 16) likely representing meningioma. No edema or mass effect on adjacent brain. Electronically Signed   By: Kristine Garbe M.D.   On: 07/27/2018 15:04   Result Date: 07/27/2018 CLINICAL DATA:  62 y/o F; metastatic breast cancer presenting with seizure. EXAM: MRI HEAD WITHOUT AND WITH CONTRAST TECHNIQUE: Multiplanar, multiecho pulse sequences of the brain and surrounding structures were obtained without and with intravenous contrast. CONTRAST:  69m MULTIHANCE  GADOBENATE DIMEGLUMINE 529 MG/ML IV SOLN COMPARISON:  07/20/2018 CT head FINDINGS: Brain: Plaque-like irregular enhancing mass with broad base to the dura measuring 5.1 x 2.2 x 2.4 cm. The mass likely originates from the dura with invasion of the underlying left frontal lobe. There is diffuse smooth dural thickening over the left cerebral convexity which may represent associated edema and/or invasive neoplasm. There is T2 FLAIR signal abnormality compatible with edema throughout the surrounding left frontal lobe extending into the anterior insula. Mass effect from the lesion and brain edema partially effaces the frontal horn of left lateral ventricle and results in 7 mm of left-to-right midline shift of the anterior septum pellucidum. No herniation. Asymmetric cerebellar volume loss. No reduced diffusion to suggest acute or early subacute infarction. No additional focus of abnormal enhancement of the brain. No abnormal susceptibility hypointensity to indicate hemorrhage. Vascular: Normal flow voids. Skull and upper cervical spine: There is confluent low signal in bone marrow throughout the left frontal bone, occipital bone, right temporal squamous likely representing osseous metastatic disease. Sinuses/Orbits: Negative. Other: None. IMPRESSION: 1. Irregular plaque-like mass, likely metastasis, measuring up to 5.1 cm, appears to originate from the left anterolateral frontal dura with invasion of the underlying frontal lobe of the brain. Mass effect from brain edema and the lesion partially effaces the frontal horn of left lateral ventricle and results in 7 mm left-to-right midline shift of anterior septum pellucidum. 2. Diffuse dural thickening over the left cerebral convexity may represent edema associated with the tumor or invasive neoplasm. 3. 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 This may be due to alcoholic cerebellar degeneration, phenytoin/lithium toxicity,  chronic sequelae of cerebellitis, associated with an inherited ataxia syndrome, or represent a neurodegenerate disorder such as multi-system atrophy or olivopontocerebellar atrophy. Electronically Signed: By: LKristine GarbeM.D. On: 07/21/2018 01:14   Ct Abdomen Pelvis W Contrast  Result Date: 07/06/2018 CLINICAL DATA:  Metastatic breast carcinoma. Recent episodes of vomiting. Some weight loss. EXAM: CT CHEST, ABDOMEN, AND PELVIS WITH CONTRAST TECHNIQUE: Multidetector CT imaging of the chest, abdomen and pelvis was  performed following the standard protocol during bolus administration of intravenous contrast. CONTRAST:  174m OMNIPAQUE IOHEXOL 300 MG/ML  SOLN COMPARISON:  CT 02/22/2018 FINDINGS: CT CHEST FINDINGS Cardiovascular: No significant vascular findings. Normal heart size. No pericardial effusion. Mediastinum/Nodes: Axillary supraclavicular adenopathy. No mediastinal hilar adenopathy. No pericardial effusion. Esophagus normal. Lungs/Pleura: No suspicious pulmonary nodules Musculoskeletal: Multiple sclerotic lesions spine and ribs unchanged CT ABDOMEN AND PELVIS FINDINGS Hepatobiliary: There is new infiltrative pattern of enhancing lesions involving large portion of the central liver (segment 5, segment 8, 4A, and 4 B). This is in a region of a previously enhancing lesion in the RIGHT hepatic lobe which is now difficult define on the background this new infiltrative process. There are several discrete lesions including a 2.5 cm peripheral enhancing lesion in the central RIGHT hepatic lobe (image 47/2). More peripheral subcapsular lesions in the dome of liver with irregular peripheral enhancement measured 2.1 x 0.8 cm on image 42/2 and 1.6 by 2.3 cm on image 38/2. The infiltrative mass like pattern involves broad segment measuring 18 x 8 cm on image 52/2 for example. Very small amount of fluid along the RIGHT hepatic lobe and paraspinal liver no biliary duct dilatation. Portal veins patent.  Postcholecystectomy. Pancreas: Pancreas is normal. No ductal dilatation. No pancreatic inflammation. Spleen: Small low-density lesions spleen is favored benign cysts. Adrenals/urinary tract: Adrenal glands, kidneys, ureters and bladder normal. Stomach/Bowel: Stomach, small bowel, appendix, and cecum are normal. The colon and rectosigmoid colon are normal. Vascular/Lymphatic: Abdominal aorta is normal caliber. There is no retroperitoneal or periportal lymphadenopathy. No pelvic lymphadenopathy. Reproductive: Uterus normal. Other: Small free fluid the pelvis. Musculoskeletal: Multiple densely sclerotic lesions in the pelvis and spine are not changed from comparison exam IMPRESSION: Chest Impression: 1. No evidence pulmonary metastasis or lymphadenopathy. 2. Stable dense sclerotic skeletal metastasis. Abdomen / Pelvis Impression: 1. New pattern of heterogeneous enhancement and enhancing ill-defined lesions in the central LEFT and RIGHT hepatic lobe at site prior enhancing lesion is highly concerning for progression of of infiltrative malignancy in the liver occupying a large portion of the RIGHT hepatic lobe and central LEFT hepatic lobe. 2. Small amount of free fluid in the abdomen pelvis. 3. No metastatic adenopathy. 4. Stable  skeletal sclerotic metastasis. These results will be called to the ordering clinician or representative by the Radiologist Assistant, and communication documented in the PACS or zVision Dashboard. Electronically Signed   By: SSuzy BouchardM.D.   On: 07/06/2018 14:37   Mr 3d Recon At Scanner  Result Date: 07/28/2018 CLINICAL DATA:  Metastatic breast cancer. Rising serum bilirubin and alkaline phosphatase levels. EXAM: MRI ABDOMEN WITHOUT AND WITH CONTRAST (INCLUDING MRCP) TECHNIQUE: Multiplanar multisequence MR imaging of the abdomen was performed both before and after the administration of intravenous contrast. Heavily T2-weighted images of the biliary and pancreatic ducts were obtained,  and three-dimensional MRCP images were rendered by post processing. CONTRAST:  7 cc Gadavist COMPARISON:  CT 07/06/2018 and 02/22/2018.  Ultrasound 07/18/2018. FINDINGS: Lower chest:  Increased atelectasis at the lung bases. Hepatobiliary: There is background hepatic steatosis and hepatomegaly. There is widespread hepatic metastatic disease (most obvious on series 7). Although more apparent on MRI, this may be progressive from recent CT. Large central component (or confluent lesions) measuring up to 17.4 x 7.2 cm on image 24/7 is similar to the most recent study. These lesions are hypervascular with heterogeneous enhancement following contrast. More than 50% of the patent parenchyma is replaced. There is no significant intrahepatic or extrahepatic biliary dilatation  post cholecystectomy. Pancreas:  Atrophied without focal lesion or ductal dilatation. Spleen: Normal in size without focal abnormality. There is a small splenule inferior to the spleen which contains a stable cyst. Adrenals/Urinary Tract: Both adrenal glands appear normal. The kidneys appear stable without evidence of mass lesion or hydronephrosis. Stomach/Bowel: No evidence of bowel wall thickening, distention or surrounding inflammatory change. Vascular/Lymphatic: There are no enlarged abdominal lymph nodes. Aortic and branch vessel atherosclerosis, better seen on CT. No evidence of large vessel occlusion. Other: Ascites has progressed. There is no definite peritoneal enhancement or nodularity to confirm carcinomatosis. Musculoskeletal: Widespread osseous metastatic disease again noted, grossly stable. IMPRESSION: 1. Extensive hepatic metastatic disease, possibly progressive from recent abdominal CT. 2. No evidence of intrahepatic or extrahepatic biliary dilatation. Elevated liver function studies may be secondary to intrahepatic cholestasis. 3. Increased ascites without peritoneal nodularity or suspicious enhancement. 4. Grossly stable widespread  osseous metastatic disease. Electronically Signed   By: Richardean Sale M.D.   On: 07/28/2018 15:48   US Abdomen Limited  Result Date: 07/18/2018 CLINICAL DATA:  Liver lesion. Trace ascites noted on prior CT. Paracentesis requested in preparation for percutaneous liver lesion biopsy. EXAM: LIMITED ABDOMEN ULTRASOUND FOR ASCITES TECHNIQUE: Limited ultrasound survey for ascites was performed in all four abdominal quadrants. COMPARISON:  CT 07/06/2018 FINDINGS: Trace abdominal ascites. No adequate pocket for safe paracentesis, which was deferred. IMPRESSION: Trace abdominal ascites.  Paracentesis deferred. Electronically Signed   By: Lucrezia Europe M.D.   On: 07/18/2018 12:38   US Biopsy (liver)  Result Date: 07/18/2018 CLINICAL DATA:  Multiple liver lesions. History of breast carcinoma. EXAM: ULTRASOUND-GUIDED CORE LIVER BIOPSY TECHNIQUE: An ultrasound guided liver biopsy was thoroughly discussed with the patient and questions were answered. The benefits, risks, alternatives, and complications were also discussed. The patient understands and wishes to proceed with the procedure. A verbal as well as written consent was obtained. Survey ultrasound of the liver was performed, a representative lesion localized in the right lobe, and an appropriate skin entry site was determined. Skin site was marked, prepped with chlorhexidine, and draped in usual sterile fashion, and infiltrated locally with 1% lidocaine. Intravenous Fentanyl and Versed were administered as conscious sedation during continuous monitoring of the patient's level of consciousness and physiological / cardiorespiratory status by the radiology RN, with a total moderate sedation time of 10 minutes. A 17 gauge trocar needle was advanced under ultrasound guidance into the liver to the margin of the lesion. 3 coaxial 18gauge core samples were then obtained through the guide needle. The guide needle was removed. Post procedure scans demonstrate no apparent  complication. COMPLICATIONS: COMPLICATIONS None immediate FINDINGS: Multiple low-attenuation liver lesions were localized. Representative core biopsy samples obtained as above. IMPRESSION: 1. Technically successful ultrasound guided core liver lesion biopsy. Electronically Signed   By: Lucrezia Europe M.D.   On: 07/18/2018 16:53   Mr Abdomen Mrcp Moise Boring Contast  Result Date: 07/28/2018 CLINICAL DATA:  Metastatic breast cancer. Rising serum bilirubin and alkaline phosphatase levels. EXAM: MRI ABDOMEN WITHOUT AND WITH CONTRAST (INCLUDING MRCP) TECHNIQUE: Multiplanar multisequence MR imaging of the abdomen was performed both before and after the administration of intravenous contrast. Heavily T2-weighted images of the biliary and pancreatic ducts were obtained, and three-dimensional MRCP images were rendered by post processing. CONTRAST:  7 cc Gadavist COMPARISON:  CT 07/06/2018 and 02/22/2018.  Ultrasound 07/18/2018. FINDINGS: Lower chest:  Increased atelectasis at the lung bases. Hepatobiliary: There is background hepatic steatosis and hepatomegaly. There is widespread hepatic metastatic disease (  most obvious on series 7). Although more apparent on MRI, this may be progressive from recent CT. Large central component (or confluent lesions) measuring up to 17.4 x 7.2 cm on image 24/7 is similar to the most recent study. These lesions are hypervascular with heterogeneous enhancement following contrast. More than 50% of the patent parenchyma is replaced. There is no significant intrahepatic or extrahepatic biliary dilatation post cholecystectomy. Pancreas:  Atrophied without focal lesion or ductal dilatation. Spleen: Normal in size without focal abnormality. There is a small splenule inferior to the spleen which contains a stable cyst. Adrenals/Urinary Tract: Both adrenal glands appear normal. The kidneys appear stable without evidence of mass lesion or hydronephrosis. Stomach/Bowel: No evidence of bowel wall thickening,  distention or surrounding inflammatory change. Vascular/Lymphatic: There are no enlarged abdominal lymph nodes. Aortic and branch vessel atherosclerosis, better seen on CT. No evidence of large vessel occlusion. Other: Ascites has progressed. There is no definite peritoneal enhancement or nodularity to confirm carcinomatosis. Musculoskeletal: Widespread osseous metastatic disease again noted, grossly stable. IMPRESSION: 1. Extensive hepatic metastatic disease, possibly progressive from recent abdominal CT. 2. No evidence of intrahepatic or extrahepatic biliary dilatation. Elevated liver function studies may be secondary to intrahepatic cholestasis. 3. Increased ascites without peritoneal nodularity or suspicious enhancement. 4. Grossly stable widespread osseous metastatic disease. Electronically Signed   By: Richardean Sale M.D.   On: 07/28/2018 15:48   Ir Imaging Guided Port Insertion  Result Date: 07/14/2018 CLINICAL DATA:  Metastatic breast cancer EXAM: RIGHT INTERNAL JUGULAR SINGLE LUMEN POWER PORT CATHETER INSERTION Date:  07/14/2018 07/14/2018 4:04 pm Radiologist:  Jerilynn Mages. Daryll Brod, MD Guidance:  Ultrasound fluoroscopic MEDICATIONS: Ancef 2 g; The antibiotic was administered within an appropriate time interval prior to skin puncture. ANESTHESIA/SEDATION: Versed 3.0 mg IV; Fentanyl 100 mcg IV; Moderate Sedation Time:  23 minutes The patient was continuously monitored during the procedure by the interventional radiology nurse under my direct supervision. FLUOROSCOPY TIME:  0 minutes, 36 seconds (5 mGy) COMPLICATIONS: None immediate. CONTRAST:  None. PROCEDURE: Informed consent was obtained from the patient following explanation of the procedure, risks, benefits and alternatives. The patient understands, agrees and consents for the procedure. All questions were addressed. A time out was performed. Maximal barrier sterile technique utilized including caps, mask, sterile gowns, sterile gloves, large sterile drape,  hand hygiene, and 2% chlorhexidine scrub. Under sterile conditions and local anesthesia, right internal jugular micropuncture venous access was performed. Access was performed with ultrasound. Images were obtained for documentation of the patent right internal jugular vein. A guide wire was inserted followed by a transitional dilator. This allowed insertion of a guide wire and catheter into the IVC. Measurements were obtained from the SVC / RA junction back to the right IJ venotomy site. In the right infraclavicular chest, a subcutaneous pocket was created over the second anterior rib. This was done under sterile conditions and local anesthesia. 1% lidocaine with epinephrine was utilized for this. A 2.5 cm incision was made in the skin. Blunt dissection was performed to create a subcutaneous pocket over the right pectoralis major muscle. The pocket was flushed with saline vigorously. There was adequate hemostasis. The port catheter was assembled and checked for leakage. The port catheter was secured in the pocket with two retention sutures. The tubing was tunneled subcutaneously to the right venotomy site and inserted into the SVC/RA junction through a valved peel-away sheath. Position was confirmed with fluoroscopy. Images were obtained for documentation. The patient tolerated the procedure well. No immediate complications. Incisions  were closed in a two layer fashion with 4 - 0 Vicryl suture. Dermabond was applied to the skin. The port catheter was accessed, blood was aspirated followed by saline and heparin flushes. Needle was removed. A dry sterile dressing was applied. IMPRESSION: Ultrasound and fluoroscopically guided right internal jugular single lumen power port catheter insertion. Tip in the SVC/RA junction. Catheter ready for use. Electronically Signed   By: Jerilynn Mages.  Shick M.D.   On: 07/14/2018 16:24      IMPRESSION/PLAN: Stacey Cox is a 62 y.o. 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  Today, we talked to the patient and family about the findings and workup thus far. We discussed the natural history of metastatic breast carcinoma and general treatment, highlighting the role of palliative radiotherapy in the management of her symptomatic calvarial metastasis. We discussed the available radiation techniques, and focused on the details of logistics and delivery. The recommendation is stereotactic radiosurgery in 5 fractions. We reviewed the anticipated acute and late sequelae associated with radiation in this setting. The patient was encouraged to ask questions that were answered to her stated satisfaction.  She appears to have good understanding of her overall disease and our treatment recommendations which are palliative intent.  At the end of the conversation, the patient elects to proceed with fractionated SRS as recommended. We will share this information with Dr. Mickeal Skinner and move forward with treatment planning accordingly.  She will continue taking her low dose Decadron 91m po qd and Keppra 5086mpo BID as prescribed.  A 3T MRI for treatment planning is scheduled for Monday 08/07/18 at 8am. She met briefly with [facility] D[facility] in the office who will be assisting with coordination of her CT simulation/treatment planning in the near future. She knows to call with any questions or concerns in the interim.     AsNicholos JohnsPA-C    MaTyler PitaMD  [facility] Direct Dial: [phone]: [phone]  Skype  [email or web address]  This document serves as a record of services personally performed by MaTyler PitaMD and AsFreeman CaldronPA-C. It was created on their behalf by HaRae Lipsa trained medical scribe. The creation of this record is based on the scribe's personal observations and the providers' statements to them. This document has been checked and approved by the attending  providers.

## 2018-08-04 ENCOUNTER — Telehealth: Payer: Self-pay | Admitting: General Practice

## 2018-08-04 ENCOUNTER — Encounter: Payer: Self-pay | Admitting: General Practice

## 2018-08-04 ENCOUNTER — Telehealth: Payer: Self-pay

## 2018-08-04 NOTE — Telephone Encounter (Signed)
-----   Message from [name], MD sent at 08/03/2018 10:13 PM EDT ----- Please let patient know her phosphorus level was a little low. I have sent prescription for phopshorus replacement. Thanks [address]

## 2018-08-04 NOTE — Telephone Encounter (Signed)
[facility] CSW Progress Note  Call from sister, Claiborne Billings.  Wants information on "in home resources and visiting nurse" support for patient. States she is HCPOA and is trying to assist patient w obtaining resources.  CSW attempted to call, call was "unable to be completed."  CSW called patient, states "I don't need that now, I have had PT and OT come out."  Home health providers have said that she does not "need it at this time."  Referred to [facility] for [employer] via [employer] [employer].  Edwyna Shell, LCSW Clinical Social Worker Phone:  [phone]

## 2018-08-04 NOTE — Telephone Encounter (Signed)
Called patient and let her know per Dr. Irene Limbo her phosphorus level is a little low and a prescription has been sent in to her pharmacy. Patient verbalized understanding and stated she will pick up prescription.

## 2018-08-07 ENCOUNTER — Ambulatory Visit
Admission: RE | Admit: 2018-08-07 | Discharge: 2018-08-07 | Disposition: A | Discharge status: Home / Self Care | Payer: BC Managed Care – PPO | Source: Ambulatory Visit | Attending: Radiation Oncology | Admitting: Radiation Oncology

## 2018-08-07 DIAGNOSIS — C50919 Malignant neoplasm of unspecified site of unspecified female breast: Secondary | ICD-10-CM

## 2018-08-07 MED ORDER — GADOBENATE DIMEGLUMINE 529 MG/ML IV SOLN
15.0000 mL | Freq: Once | INTRAVENOUS | Status: AC | PRN
  Administered 2018-08-07: 15 mL via INTRAVENOUS

## 2018-08-08 ENCOUNTER — Other Ambulatory Visit: Payer: Self-pay

## 2018-08-08 DIAGNOSIS — C50919 Malignant neoplasm of unspecified site of unspecified female breast: Secondary | ICD-10-CM

## 2018-08-08 DIAGNOSIS — C7931 Secondary malignant neoplasm of brain: Principal | ICD-10-CM

## 2018-08-08 NOTE — Progress Notes (Signed)
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  INTERVAL HISTORY   Stacey Cox is here for follow-up of her metastatic hormone positive HER-2 negative breast cancer. The patient's last visit with [place] was on 07/25/18. She is accompanied today by her friend and her sister. The pt reports that she is doing well overall.   The pt reports that she has continued to not have any break through seizures. She has begun care with Dr. Tammi Klippel in [place] and is anticipating beginning radiation.   She notes that she has tolerated Faslodex well and without any problems. She adds that she is feeling better today than she did at our last visit. She notes that her nausea is well controlled with the scopolamine patch, saw our nutritional therapist Ernestene Kiel, and has been eating well.   She notes that she has felt moderately more energy, noting that she is not needing to take as many naps during the day.   Of note since the patient's last visit, pt has had MRI Abdomen MRCP completed on 07/28/18 with results revealing Extensive hepatic metastatic disease, possibly progressive from recent abdominal CT. 2. No evidence of intrahepatic or extrahepatic biliary dilatation. Elevated liver function studies may be secondary to intrahepatic cholestasis. 3. Increased ascites without peritoneal nodularity or suspicious enhancement. 4. Grossly stable widespread osseous metastatic disease.  The pt also had a repeat MRI Brain completed on 07/28/18 which revealed Extensive nodular dural enhancement left frontal lobe with associated enhancing mass growing in the left frontal lobe is unchanged in size and compatible with metastatic disease. This is associated with  metastatic disease to the left frontal bone. Extensive edema in the left frontal lobe has progressed in the interval. Blastic metastatic disease throughout the calvarium. Metastatic disease in the cervical spine. Enhancing mass along the floor of the posterior fossa on the right is stable and most consistent with meningioma. 6 mm enhancing mass in the prepontine cistern on the right also unchanged and compatible with meningioma.  Lab results today (08/09/18) of CBC w/diff, CMP, and Reticulocytes is as follows: all values are WNL except for RBC at 3.20, HGB at 10.4, HCT at 32.2, RDW at 21.5, PLT at 104k, ANC at 7.8k, nRBC at 3, Potassium at 3.1, Glucose at 155, Calcium at 8.3, Total Protein at 5.3, Albumin at 2.2, AST at 172, ALT at 109, Alk Phos at 803, Total Bilirubin at 3.2, Retic ct pct at 4.7%, Retic ct abs at 150.4k. 08/09/18 Phosphorous is at 2.2 08/09/18 Magnesium at 2.1  On review of systems, pt reports some tiredness, eating better, mild ankle swelling, and denies abdominal pains, diarrhea, nausea, headaches, seizures, and any other symptoms.    MEDICAL HISTORY:  Past Medical History:  Diagnosis Date  . Cancer [facility])    Metastatic Breast Cancer    SURGICAL HISTORY: Past Surgical History:  Procedure Laterality Date  . CHOLECYSTECTOMY    . IR IMAGING GUIDED PORT INSERTION  07/14/2018  . TONSILLECTOMY      SOCIAL HISTORY: Social History   Socioeconomic History  . Marital status: Single    Spouse name: Not on file  . Number of children: Not on file  . Years of education: Not on  file  . Highest education level: Not on file  Occupational History  . Not on file  Social Needs  . Financial resource strain: Not on file  . Food insecurity:    Worry: Not on file    Inability: Not on file  . Transportation needs:    Medical: Not on file    Non-medical: Not on file  Tobacco Use  . Smoking status: Former Smoker    Packs/day: 0.00    Types: Cigarettes    Last attempt to quit:  01/21/2017    Years since quitting: 1.5  . Smokeless tobacco: Never Used  Substance and Sexual Activity  . Alcohol use: No    Alcohol/week: 0.0 standard drinks  . Drug use: No  . Sexual activity: Never  Lifestyle  . Physical activity:    Days per week: Not on file    Minutes per session: Not on file  . Stress: Not on file  Relationships  . Social connections:    Talks on phone: Not on file    Gets together: Not on file    Attends religious service: Not on file    Active member of club or organization: Not on file    Attends meetings of clubs or organizations: Not on file    Relationship status: Not on file  . Intimate partner violence:    Fear of current or ex partner: No    Emotionally abused: No    Physically abused: No    Forced sexual activity: No  Other Topics Concern  . Not on file  Social History Narrative   Lives alone. 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 Has phobia of the medical system.  Cigarette smoker 1/2 PPD for about 40 yrs Social alcohol use No drugs Professor at [employer] no children  FAMILY HISTORY: Family History  Problem Relation Age of Onset  . Cancer Mother        breast  . Cancer Sister        breast  Mother with h/o breast cancer in her 77's Sister with breast cancer in her 25ys and later was diagnosed with multiple myeloma. (Patient is not aware of any specific breast cancer mutations present)  ALLERGIES:  is allergic to thorazine [chlorpromazine] and ribociclib.  MEDICATIONS:  Current Outpatient Medications  Medication Sig Dispense Refill  . aspirin EC 81 MG tablet Take 1 tablet (81 mg total) by mouth daily.    [name] dexamethasone (DECADRON) 4 MG tablet Take 1 tablet (4 mg total) by mouth daily. (Patient taking differently: Take 2 mg by mouth daily. ) 30 tablet 0  . escitalopram (LEXAPRO) 20 MG tablet TAKE 1 TABLET(20 MG) BY MOUTH DAILY (Patient taking differently: Take 20 mg by mouth daily as needed (anxiety). ) 30 tablet 0  . feeding  supplement, ENSURE ENLIVE, (ENSURE ENLIVE) LIQD Take 237 mLs by mouth 2 (two) times daily between meals. 237 mL 12  . hydrOXYzine (ATARAX/VISTARIL) 10 MG tablet Take 2.5 tablets (25 mg total) by mouth 2 (two) times daily as needed for nausea or vomiting. (Patient not taking: Reported on 08/01/2018) 30 tablet 1  . K Phos Mono-Sod Phos Di & Mono (K-PHOS-NEUTRAL) [phone] MG TABS Take 1 tablet by mouth 2 (two) times daily. 60 tablet 0  . levETIRAcetam (KEPPRA) 750 MG tablet Take 1 tablet (750 mg total) by mouth 2 (two) times daily. 60 tablet 0  . lidocaine-prilocaine (EMLA) cream Apply to affected area once (Patient not taking: Reported on 08/03/2018) 30 g 3  .  loperamide (IMODIUM) 1 MG/5ML solution Take 2 mg by mouth as needed for diarrhea or loose stools.     [name] LORazepam (ATIVAN) 0.5 MG tablet Take 1 tablet (0.5 mg total) by mouth every 6 (six) hours as needed. for anxiety 60 tablet 0  . ondansetron (ZOFRAN) 4 MG tablet Take 1 tablet (4 mg total) by mouth 2 (two) times daily. Take with Verzenio. (Patient not taking: Reported on 08/01/2018) 60 tablet 0  . pantoprazole (PROTONIX) 40 MG tablet Take 1 tablet (40 mg total) by mouth daily. (Patient not taking: Reported on 08/01/2018) 30 tablet 0  . potassium chloride SA (K-DUR,KLOR-CON) 20 MEQ tablet Take 1 tablet (20 mEq total) by mouth 2 (two) times daily. 30 tablet 0  . TRANSDERM-SCOP, 1.5 MG, 1 MG/3DAYS PLACE ONE PATCH ONTO THE SKIN EVERY THREE DAYS. 10 patch 0  . Vitamin D, Ergocalciferol, (DRISDOL) 50000 units CAPS capsule TAKE 1 CAPSULE BY MOUTH 3 TIMES WEEKLY 24 capsule 0   No current facility-administered medications for this visit.     REVIEW OF SYSTEMS:    A 10+ POINT REVIEW OF SYSTEMS WAS OBTAINED including neurology, dermatology, psychiatry, cardiac, respiratory, lymph, extremities, GI, GU, Musculoskeletal, constitutional, breasts, reproductive, HEENT.  All pertinent positives are noted in the HPI.  All others are negative.   PHYSICAL  EXAMINATION:  ECOG PERFORMANCE STATUS: 1 - Symptomatic but completely ambulatory  Vitals:   08/09/18 1155  BP: 108/75  Pulse: 70  Resp: 18  Temp: 98.6 F (37 C)  SpO2: 100%   Filed Weights   08/09/18 1155  Weight: 156 lb 9.6 oz (71 kg)   .Body mass index is 26.06 kg/m.  GENERAL:alert, in no acute distress and comfortable SKIN: no acute rashes, no significant lesions EYES: conjunctiva are pink and non-injected, sclera anicteric OROPHARYNX: MMM, no exudates, no oropharyngeal erythema or ulceration NECK: supple, no JVD LYMPH:  no palpable lymphadenopathy in the cervical, axillary or inguinal regions LUNGS: clear to auscultation b/l with normal respiratory effort HEART: regular rate & rhythm ABDOMEN:  normoactive bowel sounds , RUQ tenderness to palpation without guarding or rigidity, not distended. No palpable hepatosplenomegaly.  Extremity: no pedal edema PSYCH: alert & oriented x 3 with fluent speech NEURO: no focal motor/sensory deficits   LABORATORY DATA:  I have reviewed the data as listed  . CBC Latest Ref Rng & Units 08/09/2018 07/25/2018 07/21/2018  WBC 3.9 - 10.3 K/uL 9.7 6.8 10.3  Hemoglobin 11.6 - 15.9 g/dL 10.4(L) 11.0(L) 10.4(L)  Hematocrit 34.8 - 46.6 % 32.2(L) 32.5(L) 30.3(L)  Platelets 145 - 400 K/uL 104(L) 81(L) 106(L)   CBC    Component Value Date/Time   WBC 9.7 08/09/2018 1033   WBC 6.8 07/25/2018 1529   RBC 3.20 (L) 08/09/2018 1033   RBC 3.20 (L) 08/09/2018 1033   HGB 10.4 (L) 08/09/2018 1033   HGB 13.9 11/24/2017 1340   HCT 32.2 (L) 08/09/2018 1033   HCT 42.0 11/24/2017 1340   PLT 104 (L) 08/09/2018 1033   PLT 212 11/24/2017 1340   MCV 100.6 08/09/2018 1033   MCV 97.0 11/24/2017 1340   MCH 32.5 08/09/2018 1033   MCHC 32.3 08/09/2018 1033   RDW 21.5 (H) 08/09/2018 1033   RDW 14.1 11/24/2017 1340   LYMPHSABS 0.9 08/09/2018 1033   LYMPHSABS 1.4 11/24/2017 1340   MONOABS 0.9 08/09/2018 1033   MONOABS 0.3 11/24/2017 1340   EOSABS 0.0  08/09/2018 1033   EOSABS 0.1 11/24/2017 1340   BASOSABS 0.0 08/09/2018 1033  BASOSABS 0.1 11/24/2017 1340    . CMP Latest Ref Rng & Units 08/09/2018 07/25/2018 07/21/2018  Glucose 70 - 99 mg/dL 155(H) 124(H) 172(H)  BUN 8 - 23 mg/dL _0 Creatinine 0.44 - 1.00 mg/dL 0.88 0.85 0.72  Sodium 135 - 145 mmol/L 142 137 141  Potassium 3.5 - 5.1 mmol/L 3.1(L) 4.7 4.2  Chloride 98 - 111 mmol/L 107 104 108  CO2 22 - 32 mmol/L 25 22 21(L)  Calcium 8.9 - 10.3 mg/dL 8.3(L) 8.3(L) 7.4(L)  Total Protein 6.5 - 8.1 g/dL 5.3(L) 6.0(L) 5.1(L)  Total Bilirubin 0.3 - 1.2 mg/dL 3.2(H) 6.1(HH) 4.0(H)  Alkaline Phos 38 - 126 U/L 803(H) 565(H) 367(H)  AST 15 - 41 U/L 172(HH) 252(HH) 275(H)  ALT 0 - 44 U/L 109(H) 159(H) 135(H)       07/18/18 Pathology:          RADIOGRAPHIC STUDIES: I have personally reviewed the radiological images as listed and agreed with the findings in the report. Ct Head W Or Wo Contrast  Result Date: 07/20/2018 CLINICAL DATA:  New onset witnessed seizures. History of metastatic breast cancer. EXAM: CT HEAD WITHOUT AND WITH CONTRAST TECHNIQUE: Contiguous axial images were obtained from the base of the skull through the vertex without and with intravenous contrast CONTRAST:  21m OMNIPAQUE IOHEXOL 300 MG/ML  SOLN COMPARISON:  MRI head January 22, 2017 FINDINGS: BRAIN: Irregular cystic and solid 1.8 x 2.4 x 3.9 cm LEFT frontal cortical based mass with potential dural component. Severe surrounding vasogenic edema with mass effect on LEFT frontal horn of the lateral ventricle. 5 mm resultant LEFT to RIGHT subfalcine herniation. Stable appearance of RIGHT posterior fossa 8 x 12 mm enhancing mass most compatible with meningioma. Similar characteristic 6 mm prepontine mass. No hydrocephalus. Stable appearance of symmetric inferior cerebellar atrophy VASCULAR: Moderate calcific atherosclerosis of the carotid siphons. SKULL: No skull fracture. Sclerotic expanded LEFT parietooccipital  calvarium consistent with metastatic disease. 2.3 cm sclerotic metastasis RIGHT frontal calvarium. No significant scalp soft tissue swelling. SINUSES/ORBITS: Severe frontal sinusitis. Mastoid air cells are well aerated.The included ocular globes and orbital contents are non-suspicious. OTHER: None. IMPRESSION: 1. 1.8 x 2.4 x 3.9 cm LEFT frontal cortical based mass with possible dural component highly concerning for metastatic disease, less likely abscess or cerebritis. Severe vasogenic edema with 5 mm LEFT to RIGHT subfalcine herniation. No ventricular entrapment. Recommend MRI of the brain with without contrast. 2. 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 3. Multiple calvarial metastasis. 4. Cerebellar atrophy. 5. Acute findings discussed with and reconfirmed by Dr.ELLIOTT WENTZ on 07/20/2018 at 9:41 pm. Electronically Signed   By: CElon AlasM.D.   On: 07/20/2018 21:42   Mr BJeri CosWXBContrast  Result Date: 08/07/2018 CLINICAL DATA:  62year old female with metastatic breast cancer. Anterior left frontal dural-based metastasis in conjunction with metastatic bone disease. Two small posterior fossa extra-axial masses which have been stable since March 2018 and favored to be benign meningioma. Stereotactic radiosurgery planning. EXAM: MRI HEAD WITHOUT AND WITH CONTRAST TECHNIQUE: Multiplanar, multiecho pulse sequences of the brain and surrounding structures were obtained without and with intravenous contrast. CONTRAST:  129mMULTIHANCE GADOBENATE DIMEGLUMINE 529 MG/ML IV SOLN COMPARISON:  07/28/2018 and earlier. FINDINGS: BRAIN New Lesions: None. Larger lesions: None. Stable or Smaller lesions: Lobulated and heterogeneously enhancing soft tissue mass along the left anterior frontal lobe convexity inseparable from broad-based overlying dural thickening is stable in size and configuration  since 07/28/2018, and not significantly changed since 07/21/2018. This  encompasses 49 x 28 x 27 millimeters (AP by transverse by CC). Trace associated hemosiderin or mineralization. Confluent regional left anterior hemisphere T2 and FLAIR hyperintensity compatible with vasogenic edema has not significantly improved. Stable regional mass effect including rightward midline shift of the anterior septum pellucidum by 6 millimeters. Fairly diffuse left anterior hemisphere pachymeningeal thickening persists. This extends to the midline anteriorly, with questionable mildly abnormal anterior right frontal convexity dural thickening. However, no progression since 07/21/2018. Smooth round homogeneously enhancing 12 millimeter right anterior posterior fossa extra-axial mass located near the jugular foramen appears stable since 01/22/2017. Likewise, a smaller 7 millimeter nodular extra-axial enhancing mass of the right prepontine cistern is stable and most likely benign. Other Brain findings: No superimposed restricted diffusion to suggest acute infarction. Noventriculomegaly, extra-axial fluid collection or acute intracranial hemorrhage. Stable chronic cerebellar volume loss. Stable occasional nonspecific cerebral white matter T2 and FLAIR hyperintensity. Cervicomedullary junction and pituitary are within normal limits. Vascular: Major intracranial vascular flow voids are stable. Skull and upper cervical spine: Diffusely heterogeneous bone marrow signal compatible with widespread osseous metastatic disease. Extensive calvarial involvement. Partially visible C4 and C5 vertebral involvement. Negative visible spinal cord. Sinuses/Orbits: Continued confluent bilateral inflammatory frontal sinus opacification. Other paranasal sinuses remain well pneumatized. Orbits soft tissues remain normal. Other: Mastoid air cells remain clear. Visible internal auditory structures appear normal. Scalp soft tissues remain within normal limits. IMPRESSION: 1. Study for [facility] targeting. Stable abnormal anterior left  frontal convexity compatible with pachymeningeal metastatic disease with focal brain invasion, vasogenic edema, and regional mass effect. The bulky dominant enhancing mass encompasses 49 x 28 x 27 mm, and is superimposed on widespread suspicious dural thickening in the left hemisphere and probably crossing midline anteriorly. 2. No new metastatic disease to the brain identified. Two small probable benign meningiomas in the posterior fossa remain stable since 01/22/2017. 3. Widespread calvarium bone metastases. 4. Inflammatory appearing frontal sinus disease is stable. Electronically Signed   By: Genevie Ann M.D.   On: 08/07/2018 12:38   Mr Jeri Cos HQ Contrast  Result Date: 07/28/2018 CLINICAL DATA:  Metastatic breast cancer. Recent steroid treatment. Chemotherapy, no radiation to the head. EXAM: MRI HEAD WITHOUT AND WITH CONTRAST TECHNIQUE: Multiplanar, multiecho pulse sequences of the brain and surrounding structures were obtained without and with intravenous contrast. CONTRAST:  7 mL Gadovist IV COMPARISON:  MRI head 07/21/2018 FINDINGS: Brain: Extensive dural nodular enhancement in the left frontal lobe compatible with metastatic disease. There is adjacent bony metastatic disease. Masslike enhancing tumor growing into the rib left frontal lobe measuring 4.7 x 2.0 cm appears unchanged. There is progressive white matter edema in the left frontal lobe. 5 mm midline shift to the right is similar. No hydrocephalus. No other areas of mass or edema in the brain. There is marked cerebellar atrophy bilaterally. Negative for acute infarct. Dural based enhancing mass in the posterior fossa on the right compatible with meningioma unchanged from 01/22/2017. 6 mm enhancing mass in the prepontine cistern on the right also unchanged from 2018 and compatible with meningioma. Vascular: Normal arterial flow void Skull and upper cervical spine: Blastic metastatic disease throughout the calvarium mostly in the left frontal and  parietal bones. Smaller area in the right anterior parietal bone. Metastatic disease in the cervical spine at C3, C4, and C5. Sinuses/Orbits: Mucosal edema throughout the frontal sinuses and ethmoid sinuses. Normal orbit Other: None IMPRESSION: Extensive nodular dural enhancement left frontal lobe with associated  enhancing mass growing in the left frontal lobe is unchanged in size and compatible with metastatic disease. This is associated with metastatic disease to the left frontal bone. Extensive edema in the left frontal lobe has progressed in the interval. Blastic metastatic disease throughout the calvarium. Metastatic disease in the cervical spine. Enhancing mass along the floor of the posterior fossa on the right is stable and most consistent with meningioma. 6 mm enhancing mass in the prepontine cistern on the right also unchanged and compatible with meningioma. Electronically Signed   By: Franchot Gallo M.D.   On: 07/28/2018 17:25   Mr Jeri Cos AJ Contrast  Addendum Date: 07/27/2018   ADDENDUM REPORT: 07/27/2018 15:04 ADDENDUM: Stable right CP angle 9 x 12 mm and right prepontine cistern 6 mm dural nodules (series 16, image 10 and 16) likely representing meningioma. No edema or mass effect on adjacent brain. Electronically Signed   By: Kristine Garbe M.D.   On: 07/27/2018 15:04   Result Date: 07/27/2018 CLINICAL DATA:  62 y/o F; metastatic breast cancer presenting with seizure. EXAM: MRI HEAD WITHOUT AND WITH CONTRAST TECHNIQUE: Multiplanar, multiecho pulse sequences of the brain and surrounding structures were obtained without and with intravenous contrast. CONTRAST:  28m MULTIHANCE GADOBENATE DIMEGLUMINE 529 MG/ML IV SOLN COMPARISON:  07/20/2018 CT head FINDINGS: Brain: Plaque-like irregular enhancing mass with broad base to the dura measuring 5.1 x 2.2 x 2.4 cm. The mass likely originates from the dura with invasion of the underlying left frontal lobe. There is diffuse smooth dural thickening  over the left cerebral convexity which may represent associated edema and/or invasive neoplasm. There is T2 FLAIR signal abnormality compatible with edema throughout the surrounding left frontal lobe extending into the anterior insula. Mass effect from the lesion and brain edema partially effaces the frontal horn of left lateral ventricle and results in 7 mm of left-to-right midline shift of the anterior septum pellucidum. No herniation. Asymmetric cerebellar volume loss. No reduced diffusion to suggest acute or early subacute infarction. No additional focus of abnormal enhancement of the brain. No abnormal susceptibility hypointensity to indicate hemorrhage. Vascular: Normal flow voids. Skull and upper cervical spine: There is confluent low signal in bone marrow throughout the left frontal bone, occipital bone, right temporal squamous likely representing osseous metastatic disease. Sinuses/Orbits: Negative. Other: None. IMPRESSION: 1. Irregular plaque-like mass, likely metastasis, measuring up to 5.1 cm, appears to originate from the left anterolateral frontal dura with invasion of the underlying frontal lobe of the brain. Mass effect from brain edema and the lesion partially effaces the frontal horn of left lateral ventricle and results in 7 mm left-to-right midline shift of anterior septum pellucidum. 2. Diffuse dural thickening over the left cerebral convexity may represent edema associated with the tumor or invasive neoplasm. 3. 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 This may be due to alcoholic cerebellar degeneration, phenytoin/lithium toxicity, chronic sequelae of cerebellitis, associated with an inherited ataxia syndrome, or represent a neurodegenerate disorder such as multi-system atrophy or olivopontocerebellar atrophy. Electronically Signed: By: LKristine GarbeM.D. On: 07/21/2018 01:14   Mr 3d Recon At Scanner  Result Date: 07/28/2018 CLINICAL  DATA:  Metastatic breast cancer. Rising serum bilirubin and alkaline phosphatase levels. EXAM: MRI ABDOMEN WITHOUT AND WITH CONTRAST (INCLUDING MRCP) TECHNIQUE: Multiplanar multisequence MR imaging of the abdomen was performed both before and after the administration of intravenous contrast. Heavily T2-weighted images of the biliary and pancreatic ducts were obtained, and three-dimensional MRCP images were rendered  by post processing. CONTRAST:  7 cc Gadavist COMPARISON:  CT 07/06/2018 and 02/22/2018.  Ultrasound 07/18/2018. FINDINGS: Lower chest:  Increased atelectasis at the lung bases. Hepatobiliary: There is background hepatic steatosis and hepatomegaly. There is widespread hepatic metastatic disease (most obvious on series 7). Although more apparent on MRI, this may be progressive from recent CT. Large central component (or confluent lesions) measuring up to 17.4 x 7.2 cm on image 24/7 is similar to the most recent study. These lesions are hypervascular with heterogeneous enhancement following contrast. More than 50% of the patent parenchyma is replaced. There is no significant intrahepatic or extrahepatic biliary dilatation post cholecystectomy. Pancreas:  Atrophied without focal lesion or ductal dilatation. Spleen: Normal in size without focal abnormality. There is a small splenule inferior to the spleen which contains a stable cyst. Adrenals/Urinary Tract: Both adrenal glands appear normal. The kidneys appear stable without evidence of mass lesion or hydronephrosis. Stomach/Bowel: No evidence of bowel wall thickening, distention or surrounding inflammatory change. Vascular/Lymphatic: There are no enlarged abdominal lymph nodes. Aortic and branch vessel atherosclerosis, better seen on CT. No evidence of large vessel occlusion. Other: Ascites has progressed. There is no definite peritoneal enhancement or nodularity to confirm carcinomatosis. Musculoskeletal: Widespread osseous metastatic disease again noted,  grossly stable. IMPRESSION: 1. Extensive hepatic metastatic disease, possibly progressive from recent abdominal CT. 2. No evidence of intrahepatic or extrahepatic biliary dilatation. Elevated liver function studies may be secondary to intrahepatic cholestasis. 3. Increased ascites without peritoneal nodularity or suspicious enhancement. 4. Grossly stable widespread osseous metastatic disease. Electronically Signed   By: Richardean Sale M.D.   On: 07/28/2018 15:48   US Abdomen Limited  Result Date: 07/18/2018 CLINICAL DATA:  Liver lesion. Trace ascites noted on prior CT. Paracentesis requested in preparation for percutaneous liver lesion biopsy. EXAM: LIMITED ABDOMEN ULTRASOUND FOR ASCITES TECHNIQUE: Limited ultrasound survey for ascites was performed in all four abdominal quadrants. COMPARISON:  CT 07/06/2018 FINDINGS: Trace abdominal ascites. No adequate pocket for safe paracentesis, which was deferred. IMPRESSION: Trace abdominal ascites.  Paracentesis deferred. Electronically Signed   By: Lucrezia Europe M.D.   On: 07/18/2018 12:38   US Biopsy (liver)  Result Date: 07/18/2018 CLINICAL DATA:  Multiple liver lesions. History of breast carcinoma. EXAM: ULTRASOUND-GUIDED CORE LIVER BIOPSY TECHNIQUE: An ultrasound guided liver biopsy was thoroughly discussed with the patient and questions were answered. The benefits, risks, alternatives, and complications were also discussed. The patient understands and wishes to proceed with the procedure. A verbal as well as written consent was obtained. Survey ultrasound of the liver was performed, a representative lesion localized in the right lobe, and an appropriate skin entry site was determined. Skin site was marked, prepped with chlorhexidine, and draped in usual sterile fashion, and infiltrated locally with 1% lidocaine. Intravenous Fentanyl and Versed were administered as conscious sedation during continuous monitoring of the patient's level of consciousness and  physiological / cardiorespiratory status by the radiology RN, with a total moderate sedation time of 10 minutes. A 17 gauge trocar needle was advanced under ultrasound guidance into the liver to the margin of the lesion. 3 coaxial 18gauge core samples were then obtained through the guide needle. The guide needle was removed. Post procedure scans demonstrate no apparent complication. COMPLICATIONS: COMPLICATIONS None immediate FINDINGS: Multiple low-attenuation liver lesions were localized. Representative core biopsy samples obtained as above. IMPRESSION: 1. Technically successful ultrasound guided core liver lesion biopsy. Electronically Signed   By: Lucrezia Europe M.D.   On: 07/18/2018 16:53   Mr  Abdomen Mrcp W Wo Contast  Result Date: 07/28/2018 CLINICAL DATA:  Metastatic breast cancer. Rising serum bilirubin and alkaline phosphatase levels. EXAM: MRI ABDOMEN WITHOUT AND WITH CONTRAST (INCLUDING MRCP) TECHNIQUE: Multiplanar multisequence MR imaging of the abdomen was performed both before and after the administration of intravenous contrast. Heavily T2-weighted images of the biliary and pancreatic ducts were obtained, and three-dimensional MRCP images were rendered by post processing. CONTRAST:  7 cc Gadavist COMPARISON:  CT 07/06/2018 and 02/22/2018.  Ultrasound 07/18/2018. FINDINGS: Lower chest:  Increased atelectasis at the lung bases. Hepatobiliary: There is background hepatic steatosis and hepatomegaly. There is widespread hepatic metastatic disease (most obvious on series 7). Although more apparent on MRI, this may be progressive from recent CT. Large central component (or confluent lesions) measuring up to 17.4 x 7.2 cm on image 24/7 is similar to the most recent study. These lesions are hypervascular with heterogeneous enhancement following contrast. More than 50% of the patent parenchyma is replaced. There is no significant intrahepatic or extrahepatic biliary dilatation post cholecystectomy. Pancreas:   Atrophied without focal lesion or ductal dilatation. Spleen: Normal in size without focal abnormality. There is a small splenule inferior to the spleen which contains a stable cyst. Adrenals/Urinary Tract: Both adrenal glands appear normal. The kidneys appear stable without evidence of mass lesion or hydronephrosis. Stomach/Bowel: No evidence of bowel wall thickening, distention or surrounding inflammatory change. Vascular/Lymphatic: There are no enlarged abdominal lymph nodes. Aortic and branch vessel atherosclerosis, better seen on CT. No evidence of large vessel occlusion. Other: Ascites has progressed. There is no definite peritoneal enhancement or nodularity to confirm carcinomatosis. Musculoskeletal: Widespread osseous metastatic disease again noted, grossly stable. IMPRESSION: 1. Extensive hepatic metastatic disease, possibly progressive from recent abdominal CT. 2. No evidence of intrahepatic or extrahepatic biliary dilatation. Elevated liver function studies may be secondary to intrahepatic cholestasis. 3. Increased ascites without peritoneal nodularity or suspicious enhancement. 4. Grossly stable widespread osseous metastatic disease. Electronically Signed   By: Richardean Sale M.D.   On: 07/28/2018 15:48   Ir Imaging Guided Port Insertion  Result Date: 07/14/2018 CLINICAL DATA:  Metastatic breast cancer EXAM: RIGHT INTERNAL JUGULAR SINGLE LUMEN POWER PORT CATHETER INSERTION Date:  07/14/2018 07/14/2018 4:04 pm Radiologist:  Jerilynn Mages. Daryll Brod, MD Guidance:  Ultrasound fluoroscopic MEDICATIONS: Ancef 2 g; The antibiotic was administered within an appropriate time interval prior to skin puncture. ANESTHESIA/SEDATION: Versed 3.0 mg IV; Fentanyl 100 mcg IV; Moderate Sedation Time:  23 minutes The patient was continuously monitored during the procedure by the interventional radiology nurse under my direct supervision. FLUOROSCOPY TIME:  0 minutes, 36 seconds (5 mGy) COMPLICATIONS: None immediate. CONTRAST:   None. PROCEDURE: Informed consent was obtained from the patient following explanation of the procedure, risks, benefits and alternatives. The patient understands, agrees and consents for the procedure. All questions were addressed. A time out was performed. Maximal barrier sterile technique utilized including caps, mask, sterile gowns, sterile gloves, large sterile drape, hand hygiene, and 2% chlorhexidine scrub. Under sterile conditions and local anesthesia, right internal jugular micropuncture venous access was performed. Access was performed with ultrasound. Images were obtained for documentation of the patent right internal jugular vein. A guide wire was inserted followed by a transitional dilator. This allowed insertion of a guide wire and catheter into the IVC. Measurements were obtained from the SVC / RA junction back to the right IJ venotomy site. In the right infraclavicular chest, a subcutaneous pocket was created over the second anterior rib. This was done under sterile conditions  and local anesthesia. 1% lidocaine with epinephrine was utilized for this. A 2.5 cm incision was made in the skin. Blunt dissection was performed to create a subcutaneous pocket over the right pectoralis major muscle. The pocket was flushed with saline vigorously. There was adequate hemostasis. The port catheter was assembled and checked for leakage. The port catheter was secured in the pocket with two retention sutures. The tubing was tunneled subcutaneously to the right venotomy site and inserted into the SVC/RA junction through a valved peel-away sheath. Position was confirmed with fluoroscopy. Images were obtained for documentation. The patient tolerated the procedure well. No immediate complications. Incisions were closed in a two layer fashion with 4 - 0 Vicryl suture. Dermabond was applied to the skin. The port catheter was accessed, blood was aspirated followed by saline and heparin flushes. Needle was removed. A dry  sterile dressing was applied. IMPRESSION: Ultrasound and fluoroscopically guided right internal jugular single lumen power port catheter insertion. Tip in the SVC/RA junction. Catheter ready for use. Electronically Signed   By: Jerilynn Mages.  Shick M.D.   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 Multifocal tumor in the left breast with biopsy-proven left axillary metastases.   Noted to have extensive bone metastases and pulmonary metastases. Patient was noted to have calvarial metastases but no overt parenchymal metastasis She has been noted to have good clinical response and her tumor marker levels of progressively normalized. 09/14/17 CT Chest/ad/pelvis Results discussed in details - good response.  02/22/18 CT revealed  Newly apparent 2.1 by 2.0 cm rim enhancing lesion in the right hepatic lobe suspicious for a metastatic lesion. This was not apparent on prior exams although the prior exams were all noncontrast and thus the lesion may have been present but with reduced conspicuity. There is also a small enhancing lesion further posteriorly in the right hepatic lobe which is technically nonspecific and could be a small hemangioma or a small metastatic lesion. Stable distribution and appearance of prior sclerotic osseous metastatic disease without bony progression. 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   07/06/18 CT C/A/P which revealed New pattern of heterogeneous enhancement and enhancing ill-defined lesions in the central LEFT and RIGHT hepatic lobe at site prior enhancing lesion is highly concerning for progression of of infiltrative malignancy in the liver occupying a large portion of the RIGHT hepatic lobe and central LEFT hepatic lobe. 2. Small amount of free fluid in the abdomen pelvis. 3. No  metastatic adenopathy.4. No evidence pulmonary metastasis or lymphadenopathy. 5. 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 Much improved back pain.  # 3 Neutropenia Related to her Ribociclib - resolved. Patient is currently on Verzenio and has not developed any neutropenia. This is being monitored.  #4 Increased LFTs due to new liver metastases  #5 Cancer related pain - controlled - status post palliative RT to spine - no currently needing any pain medications.  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 No other new medication. Now resolved. Monitoring on increasing doses of Verzenio. No issues with recurrent rash on Verzenio 168m po BID  #7 Brain metastasis  07/20/18 CT Head revealed 1.8 x 2.4 x 3.9  cm LEFT frontal cortical based mass with possible dural component highly concerning for metastatic disease, less likely abscess or cerebritis. Severe vasogenic edema with 5 mm LEFT to RIGHT subfalcine herniation. No ventricular entrapment. 2. 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 3. Multiple calvarial metastasis. 4. Cerebellar atrophy.    07/21/18 MRI Brain revealed  Irregular plaque-like mass, likely metastasis, measuring up to 5.1 cm, appears to originate from the left anterolateral frontal dura with invasion of the underlying frontal lobe of the brain. Mass effect from brain edema and the lesion partially effaces the frontal horn of left lateral ventricle and results in 7 mm left-to-right midline shift of anterior septum pellucidum. 2. Diffuse dural thickening over the left cerebral convexity may represent edema associated with the tumor or invasive neoplasm. 3. 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  PLAN:  -Discussed the 07/18/18 Liver needle/core biopsy right lesion consi with Ki67 at  40%, ER/PR +ve -Avoid tylenol  -Continue follow up with Dr. Mickeal Skinner in Neuro-oncology and Dr. Tammi Klippel in [place] taking Claritin for Neulasta related pain -Continue with Delton See every 4 weeks - hold 1 dose   #8 Vitamin D deficiency Vit D levels 23.1 on 3/13. Back down to 19 Plan  -Discussed pt labwork today, 08/09/18; Platelets improved, blood counts stable, bilirubin improved some to 3.2, transaminases are improved, Alk Phos remains high at 803. Phosphorous and magnesium improved. -Discussed the MRI Abdomen MRCP which revealed Extensive hepatic metastatic disease, possibly progressive from recent abdominal CT. 2. No evidence of intrahepatic or extrahepatic biliary dilatation. Elevated liver function studies may be secondary to intrahepatic cholestasis. 3. Increased ascites without peritoneal nodularity or suspicious enhancement. 4. Grossly stable widespread osseous metastatic disease. -Discussed the 07/28/18 MRI Brain which revealed Extensive nodular dural enhancement left frontal lobe with associated enhancing mass growing in the left frontal lobe is unchanged in size and compatible with metastatic disease. This is associated with metastatic disease to the left frontal bone. Extensive edema in the left frontal lobe has progressed in the interval. Blastic metastatic disease throughout the calvarium. Metastatic disease in the cervical spine. Enhancing mass along the floor of the posterior fossa on the right is stable and most consistent with meningioma. 6 mm enhancing mass in the prepontine cistern on the right also unchanged and compatible with meningioma. -Will begin potassium replacement as well, and continue phosphorous replacement. Recommend eating more nuts and bananas.  -Will continue with dose adjusted regimen of Doxorubicin, Cyclophosphamide, and Paclitaxel  -Continue Faslodex -Recommended that the pt supplement her meals with Boost or Ensure and continue eating well -Discussed  beginning Neulasta injections and told pt that this can cause bone pain -Role for Liver directed treatments with IR -Will see the pt back in 10-12 days  -Continue ergocalciferol at current dose. 50k units 3 times a week. Recommended she take this with fatty foods to improve absorption. -f/u with radiation oncology and neuro-oncology for mx of brain metastases.  -please schedule neulasta dose as per orders on 9/20. -RTC with Dr Katrell Milhorn in 10 days with labs -please schedule C3 of chemotherapy with neulasta as per orders in 3 weeks -continue Faslodex injection as per orders.    All of the patients questions were answered with apparent satisfaction. The patient knows to call the clinic with any problems, questions or concerns.   The total time spent in the appt was 30 minutes and more than 50% was on  counseling and direct patient cares.     Sullivan Lone MD MS AAHIVMS [facility] Hematology/Oncology Physician [facility]  (Office):       [phone] (Work cell):  [phone] (Fax):           [phone]  I, Baldwin Jamaica, am acting as a scribe for Dr. Irene Limbo  .I have reviewed the above documentation for accuracy and completeness, and I agree with the above. [name] MD

## 2018-08-09 ENCOUNTER — Inpatient Hospital Stay: Payer: BC Managed Care – PPO

## 2018-08-09 ENCOUNTER — Inpatient Hospital Stay (HOSPITAL_BASED_OUTPATIENT_CLINIC_OR_DEPARTMENT_OTHER): Payer: BC Managed Care – PPO | Admitting: Hematology

## 2018-08-09 ENCOUNTER — Telehealth: Payer: Self-pay | Admitting: Hematology

## 2018-08-09 VITALS — BP 108/75 | HR 70 | Temp 98.6°F | Resp 18 | Ht 65.0 in | Wt 156.6 lb

## 2018-08-09 DIAGNOSIS — Z5111 Encounter for antineoplastic chemotherapy: Secondary | ICD-10-CM | POA: Diagnosis not present

## 2018-08-09 DIAGNOSIS — Z7189 Other specified counseling: Secondary | ICD-10-CM

## 2018-08-09 DIAGNOSIS — C7931 Secondary malignant neoplasm of brain: Principal | ICD-10-CM

## 2018-08-09 DIAGNOSIS — C787 Secondary malignant neoplasm of liver and intrahepatic bile duct: Secondary | ICD-10-CM | POA: Diagnosis not present

## 2018-08-09 DIAGNOSIS — C50919 Malignant neoplasm of unspecified site of unspecified female breast: Secondary | ICD-10-CM

## 2018-08-09 DIAGNOSIS — Z95828 Presence of other vascular implants and grafts: Secondary | ICD-10-CM

## 2018-08-09 DIAGNOSIS — R569 Unspecified convulsions: Secondary | ICD-10-CM

## 2018-08-09 DIAGNOSIS — C7800 Secondary malignant neoplasm of unspecified lung: Secondary | ICD-10-CM

## 2018-08-09 DIAGNOSIS — C7951 Secondary malignant neoplasm of bone: Secondary | ICD-10-CM

## 2018-08-09 DIAGNOSIS — E559 Vitamin D deficiency, unspecified: Secondary | ICD-10-CM

## 2018-08-09 DIAGNOSIS — C50812 Malignant neoplasm of overlapping sites of left female breast: Secondary | ICD-10-CM | POA: Diagnosis not present

## 2018-08-09 DIAGNOSIS — E876 Hypokalemia: Secondary | ICD-10-CM

## 2018-08-09 DIAGNOSIS — G893 Neoplasm related pain (acute) (chronic): Secondary | ICD-10-CM

## 2018-08-09 LAB — CMP (CANCER CENTER ONLY)
ALBUMIN: 2.2 g/dL — AB (ref 3.5–5.0)
ALT: 109 U/L — AB (ref 0–44)
AST: 172 U/L — AB (ref 15–41)
Alkaline Phosphatase: 803 U/L — ABNORMAL HIGH (ref 38–126)
Anion gap: 10 (ref 5–15)
BILIRUBIN TOTAL: 3.2 mg/dL — AB (ref 0.3–1.2)
BUN: 11 mg/dL (ref 8–23)
CO2: 25 mmol/L (ref 22–32)
CREATININE: 0.88 mg/dL (ref 0.44–1.00)
Calcium: 8.3 mg/dL — ABNORMAL LOW (ref 8.9–10.3)
Chloride: 107 mmol/L (ref 98–111)
GFR, Est AFR Am: >60 mL/min (ref >60)
GFR, Estimated: >60 mL/min (ref >60)
GLUCOSE: 155 mg/dL — AB (ref 70–99)
Potassium: 3.1 mmol/L — ABNORMAL LOW (ref 3.5–5.1)
Sodium: 142 mmol/L (ref 135–145)
Total Protein: 5.3 g/dL — ABNORMAL LOW (ref 6.5–8.1)

## 2018-08-09 LAB — CBC WITH DIFFERENTIAL (CANCER CENTER ONLY)
Basophils Absolute: 0 10*3/uL (ref 0.0–0.1)
Basophils Relative: 0 %
Eosinophils Absolute: 0 10*3/uL (ref 0.0–0.5)
Eosinophils Relative: 0 %
HEMATOCRIT: 32.2 % — AB (ref 34.8–46.6)
Hemoglobin: 10.4 g/dL — ABNORMAL LOW (ref 11.6–15.9)
LYMPHS ABS: 0.9 10*3/uL (ref 0.9–3.3)
LYMPHS PCT: 9 %
MCH: 32.5 pg (ref 25.1–34.0)
MCHC: 32.3 g/dL (ref 31.5–36.0)
MCV: 100.6 fL (ref 79.5–101.0)
MONOS PCT: 10 %
Monocytes Absolute: 0.9 10*3/uL (ref 0.1–0.9)
Neutro Abs: 7.8 10*3/uL — ABNORMAL HIGH (ref 1.5–6.5)
Neutrophils Relative %: 81 %
PLATELETS: 104 10*3/uL — AB (ref 145–400)
RBC: 3.2 MIL/uL — ABNORMAL LOW (ref 3.70–5.45)
RDW: 21.5 % — ABNORMAL HIGH (ref 11.2–14.5)
WBC Count: 9.7 10*3/uL (ref 3.9–10.3)
nRBC: 3 /100 WBC — ABNORMAL HIGH

## 2018-08-09 LAB — PHOSPHORUS: Phosphorus: 2.2 mg/dL — ABNORMAL LOW (ref 2.5–4.6)

## 2018-08-09 LAB — MAGNESIUM: Magnesium: 2.1 mg/dL (ref 1.7–2.4)

## 2018-08-09 LAB — RETICULOCYTES
RBC.: 3.2 MIL/uL — ABNORMAL LOW (ref 3.70–5.45)
Retic Count, Absolute: 150.4 10*3/uL — ABNORMAL HIGH (ref 33.7–90.7)
Retic Ct Pct: 4.7 % — ABNORMAL HIGH (ref 0.7–2.1)

## 2018-08-09 MED ORDER — SODIUM CHLORIDE 0.9 % IV SOLN
Freq: Once | INTRAVENOUS | Status: AC
  Administered 2018-08-09: 14:00:00 via INTRAVENOUS
  Filled 2018-08-09: qty 250

## 2018-08-09 MED ORDER — SODIUM CHLORIDE 0.9 % IV SOLN
Freq: Once | INTRAVENOUS | Status: AC
  Administered 2018-08-09: 14:00:00 via INTRAVENOUS
  Filled 2018-08-09: qty 5

## 2018-08-09 MED ORDER — HEPARIN SOD (PORK) LOCK FLUSH 100 UNIT/ML IV SOLN
500.0000 [IU] | Freq: Once | INTRAVENOUS | Status: AC | PRN
  Administered 2018-08-09: 500 [IU]
  Filled 2018-08-09: qty 5

## 2018-08-09 MED ORDER — SODIUM CHLORIDE 0.9% FLUSH
10.0000 mL | INTRAVENOUS | Status: DC | PRN
  Administered 2018-08-09: 10 mL
  Filled 2018-08-09: qty 10

## 2018-08-09 MED ORDER — PALONOSETRON HCL INJECTION 0.25 MG/5ML
0.2500 mg | Freq: Once | INTRAVENOUS | Status: AC
  Administered 2018-08-09: 0.25 mg via INTRAVENOUS

## 2018-08-09 MED ORDER — POTASSIUM CHLORIDE CRYS ER 20 MEQ PO TBCR
40.0000 meq | EXTENDED_RELEASE_TABLET | Freq: Once | ORAL | Status: AC
  Administered 2018-08-09: 40 meq via ORAL

## 2018-08-09 MED ORDER — POTASSIUM CHLORIDE CRYS ER 20 MEQ PO TBCR
EXTENDED_RELEASE_TABLET | ORAL | Status: AC
  Filled 2018-08-09: qty 2

## 2018-08-09 MED ORDER — SODIUM CHLORIDE 0.9% FLUSH
10.0000 mL | Freq: Once | INTRAVENOUS | Status: AC
  Administered 2018-08-09: 10 mL
  Filled 2018-08-09: qty 10

## 2018-08-09 MED ORDER — SODIUM CHLORIDE 0.9 % IV SOLN
400.0000 mg/m2 | Freq: Once | INTRAVENOUS | Status: AC
  Administered 2018-08-09: 720 mg via INTRAVENOUS
  Filled 2018-08-09: qty 36

## 2018-08-09 MED ORDER — POTASSIUM CHLORIDE CRYS ER 20 MEQ PO TBCR: 20.0000 meq | EXTENDED_RELEASE_TABLET | Freq: Two times a day (BID) | ORAL | 0 refills | Status: DC

## 2018-08-09 MED ORDER — PALONOSETRON HCL INJECTION 0.25 MG/5ML
INTRAVENOUS | Status: AC
  Filled 2018-08-09: qty 5

## 2018-08-09 MED ORDER — DOXORUBICIN HCL CHEMO IV INJECTION 2 MG/ML
20.0000 mg/m2 | Freq: Once | INTRAVENOUS | Status: AC
  Administered 2018-08-09: 36 mg via INTRAVENOUS
  Filled 2018-08-09: qty 18

## 2018-08-09 NOTE — Patient Instructions (Signed)
[facility] Discharge Instructions for Patients Receiving Chemotherapy  Today you received the following chemotherapy agents Adriamycin and Cytoxan  To help prevent nausea and vomiting after your treatment, we encourage you to take your nausea medication as directed   If you develop nausea and vomiting that is not controlled by your nausea medication, call the clinic.   BELOW ARE SYMPTOMS THAT SHOULD BE REPORTED IMMEDIATELY:  *FEVER GREATER THAN 100.5 F  *CHILLS WITH OR WITHOUT FEVER  NAUSEA AND VOMITING THAT IS NOT CONTROLLED WITH YOUR NAUSEA MEDICATION  *UNUSUAL SHORTNESS OF BREATH  *UNUSUAL BRUISING OR BLEEDING  TENDERNESS IN MOUTH AND THROAT WITH OR WITHOUT PRESENCE OF ULCERS  *URINARY PROBLEMS  *BOWEL PROBLEMS  UNUSUAL RASH Items with * indicate a potential emergency and should be followed up as soon as possible.  Feel free to call the clinic should you have any questions or concerns. The clinic phone number is [phone].  Please show the [facility] at check-in to the Emergency Department and triage nurse.   Doxorubicin (Adriamycin) injection What is this medicine? DOXORUBICIN (dox oh ROO bi sin) is a chemotherapy drug. It is used to treat many kinds of cancer like leukemia, lymphoma, neuroblastoma, sarcoma, and Wilms' tumor. It is also used to treat bladder cancer, breast cancer, lung cancer, ovarian cancer, stomach cancer, and thyroid cancer. This medicine may be used for other purposes; ask your health care provider or pharmacist if you have questions. COMMON BRAND NAME(S): Adriamycin, Adriamycin PFS, Adriamycin RDF, Rubex What should I tell my health care provider before I take this medicine? They need to know if you have any of these conditions: -heart disease -history of low blood counts caused by a medicine -liver disease -recent or ongoing radiation therapy -an unusual or allergic reaction to doxorubicin, other chemotherapy agents,  other medicines, foods, dyes, or preservatives -pregnant or trying to get pregnant -breast-feeding How should I use this medicine? This drug is given as an infusion into a vein. It is administered in a hospital or clinic by a specially trained health care professional. If you have pain, swelling, burning or any unusual feeling around the site of your injection, tell your health care professional right away. Talk to your pediatrician regarding the use of this medicine in children. Special care may be needed. Overdosage: If you think you have taken too much of this medicine contact a poison control center or emergency room at once. NOTE: This medicine is only for you. Do not share this medicine with others. What if I miss a dose? It is important not to miss your dose. Call your doctor or health care professional if you are unable to keep an appointment. What may interact with this medicine? This medicine may interact with the following medications: -6-mercaptopurine -paclitaxel -phenytoin -St. John's Wort -trastuzumab -verapamil This list may not describe all possible interactions. Give your health care provider a list of all the medicines, herbs, non-prescription drugs, or dietary supplements you use. Also tell them if you smoke, drink alcohol, or use illegal drugs. Some items may interact with your medicine. What should I watch for while using this medicine? This drug may make you feel generally unwell. This is not uncommon, as chemotherapy can affect healthy cells as well as cancer cells. Report any side effects. Continue your course of treatment even though you feel ill unless your doctor tells you to stop. There is a maximum amount of this medicine you should receive throughout your life. The amount  depends on the medical condition being treated and your overall health. Your doctor will watch how much of this medicine you receive in your lifetime. Tell your doctor if you have taken this  medicine before. You may need blood work done while you are taking this medicine. Your urine may turn red for a few days after your dose. This is not blood. If your urine is dark or brown, call your doctor. In some cases, you may be given additional medicines to help with side effects. Follow all directions for their use. Call your doctor or health care professional for advice if you get a fever, chills or sore throat, or other symptoms of a cold or flu. Do not treat yourself. This drug decreases your body's ability to fight infections. Try to avoid being around people who are sick. This medicine may increase your risk to bruise or bleed. Call your doctor or health care professional if you notice any unusual bleeding. Talk to your doctor about your risk of cancer. You may be more at risk for certain types of cancers if you take this medicine. Do not become pregnant while taking this medicine or for 6 months after stopping it. Women should inform their doctor if they wish to become pregnant or think they might be pregnant. Men should not father a child while taking this medicine and for 6 months after stopping it. There is a potential for serious side effects to an unborn child. Talk to your health care professional or pharmacist for more information. Do not breast-feed an infant while taking this medicine. This medicine has caused ovarian failure in some women and reduced sperm counts in some men This medicine may interfere with the ability to have a child. Talk with your doctor or health care professional if you are concerned about your fertility. What side effects may I notice from receiving this medicine? Side effects that you should report to your doctor or health care professional as soon as possible: -allergic reactions like skin rash, itching or hives, swelling of the face, lips, or tongue -breathing problems -chest pain -fast or irregular heartbeat -low blood counts - this medicine may  decrease the number of white blood cells, red blood cells and platelets. You may be at increased risk for infections and bleeding. -pain, redness, or irritation at site where injected -signs of infection - fever or chills, cough, sore throat, pain or difficulty passing urine -signs of decreased platelets or bleeding - bruising, pinpoint red spots on the skin, black, tarry stools, blood in the urine -swelling of the ankles, feet, hands -tiredness -weakness Side effects that usually do not require medical attention (report to your doctor or health care professional if they continue or are bothersome): -diarrhea -hair loss -mouth sores -nail discoloration or damage -nausea -red colored urine -vomiting This list may not describe all possible side effects. Call your doctor for medical advice about side effects. You may report side effects to FDA at 1-800-FDA-1088. Where should I keep my medicine? This drug is given in a hospital or clinic and will not be stored at home. NOTE: This sheet is a summary. It may not cover all possible information. If you have questions about this medicine, talk to your doctor, pharmacist, or health care provider.  2018 Elsevier/Gold Standard (2016-01-05 11:28:51)   Cyclophosphamide (Cytoxan) injection What is this medicine? CYCLOPHOSPHAMIDE (sye kloe FOSS fa mide) is a chemotherapy drug. It slows the growth of cancer cells. This medicine is used to treat many types  of cancer like lymphoma, myeloma, leukemia, breast cancer, and ovarian cancer, to name a few. This medicine may be used for other purposes; ask your health care provider or pharmacist if you have questions. COMMON BRAND NAME(S): Cytoxan, Neosar What should I tell my health care provider before I take this medicine? They need to know if you have any of these conditions: -blood disorders -history of other chemotherapy -infection -kidney disease -liver disease -recent or ongoing radiation  therapy -tumors in the bone marrow -an unusual or allergic reaction to cyclophosphamide, other chemotherapy, other medicines, foods, dyes, or preservatives -pregnant or trying to get pregnant -breast-feeding How should I use this medicine? This drug is usually given as an injection into a vein or muscle or by infusion into a vein. It is administered in a hospital or clinic by a specially trained health care professional. Talk to your pediatrician regarding the use of this medicine in children. Special care may be needed. Overdosage: If you think you have taken too much of this medicine contact a poison control center or emergency room at once. NOTE: This medicine is only for you. Do not share this medicine with others. What if I miss a dose? It is important not to miss your dose. Call your doctor or health care professional if you are unable to keep an appointment. What may interact with this medicine? This medicine may interact with the following medications: -amiodarone -amphotericin B -azathioprine -certain antiviral medicines for HIV or AIDS such as protease inhibitors (e.g., indinavir, ritonavir) and zidovudine -certain blood pressure medications such as benazepril, captopril, enalapril, fosinopril, lisinopril, moexipril, monopril, perindopril, quinapril, ramipril, trandolapril -certain cancer medications such as anthracyclines (e.g., daunorubicin, doxorubicin), busulfan, cytarabine, paclitaxel, pentostatin, tamoxifen, trastuzumab -certain diuretics such as chlorothiazide, chlorthalidone, hydrochlorothiazide, indapamide, metolazone -certain medicines that treat or prevent blood clots like warfarin -certain muscle relaxants such as succinylcholine -cyclosporine -etanercept -indomethacin -medicines to increase blood counts like filgrastim, pegfilgrastim, sargramostim -medicines used as general anesthesia -metronidazole -natalizumab This list may not describe all possible  interactions. Give your health care provider a list of all the medicines, herbs, non-prescription drugs, or dietary supplements you use. Also tell them if you smoke, drink alcohol, or use illegal drugs. Some items may interact with your medicine. What should I watch for while using this medicine? Visit your doctor for checks on your progress. This drug may make you feel generally unwell. This is not uncommon, as chemotherapy can affect healthy cells as well as cancer cells. Report any side effects. Continue your course of treatment even though you feel ill unless your doctor tells you to stop. Drink water or other fluids as directed. Urinate often, even at night. In some cases, you may be given additional medicines to help with side effects. Follow all directions for their use. Call your doctor or health care professional for advice if you get a fever, chills or sore throat, or other symptoms of a cold or flu. Do not treat yourself. This drug decreases your body's ability to fight infections. Try to avoid being around people who are sick. This medicine may increase your risk to bruise or bleed. Call your doctor or health care professional if you notice any unusual bleeding. Be careful brushing and flossing your teeth or using a toothpick because you may get an infection or bleed more easily. If you have any dental work done, tell your dentist you are receiving this medicine. You may get drowsy or dizzy. Do not drive, use machinery, or do anything  that needs mental alertness until you know how this medicine affects you. Do not become pregnant while taking this medicine or for 1 year after stopping it. Women should inform their doctor if they wish to become pregnant or think they might be pregnant. Men should not father a child while taking this medicine and for 4 months after stopping it. There is a potential for serious side effects to an unborn child. Talk to your health care professional or pharmacist for  more information. Do not breast-feed an infant while taking this medicine. This medicine may interfere with the ability to have a child. This medicine has caused ovarian failure in some women. This medicine has caused reduced sperm counts in some men. You should talk with your doctor or health care professional if you are concerned about your fertility. If you are going to have surgery, tell your doctor or health care professional that you have taken this medicine. What side effects may I notice from receiving this medicine? Side effects that you should report to your doctor or health care professional as soon as possible: -allergic reactions like skin rash, itching or hives, swelling of the face, lips, or tongue -low blood counts - this medicine may decrease the number of white blood cells, red blood cells and platelets. You may be at increased risk for infections and bleeding. -signs of infection - fever or chills, cough, sore throat, pain or difficulty passing urine -signs of decreased platelets or bleeding - bruising, pinpoint red spots on the skin, black, tarry stools, blood in the urine -signs of decreased red blood cells - unusually weak or tired, fainting spells, lightheadedness -breathing problems -dark urine -dizziness -palpitations -swelling of the ankles, feet, hands -trouble passing urine or change in the amount of urine -weight gain -yellowing of the eyes or skin Side effects that usually do not require medical attention (report to your doctor or health care professional if they continue or are bothersome): -changes in nail or skin color -hair loss -missed menstrual periods -mouth sores -nausea, vomiting This list may not describe all possible side effects. Call your doctor for medical advice about side effects. You may report side effects to FDA at 1-800-FDA-1088. Where should I keep my medicine? This drug is given in a hospital or clinic and will not be stored at  home. NOTE: This sheet is a summary. It may not cover all possible information. If you have questions about this medicine, talk to your doctor, pharmacist, or health care provider.  2018 Elsevier/Gold Standard (2012-09-22 16:22:58)

## 2018-08-09 NOTE — Progress Notes (Addendum)
Has armband been applied?  Yes  Does patient have an allergy to IV contrast dye?: Yes.       Has patient ever received premedication for IV contrast dye?: No.   Does patient take metformin?: No.  If patient does take metformin when was the last dose: N/A  Date of lab work:    IV site: Right chest port  Has IV site been added to flowsheet?  Yes.    BP 96/69 (BP Location: Right Arm, Patient Position: Sitting)   Pulse 85   Temp 99.1 F (37.3 C) (Oral)   Resp 20   Ht 5\' 5"  (1.651 m)   Wt 158 lb 6.4 oz (71.8 kg)   SpO2 100%   BMI 26.36 kg/m    Daine Croker M. Leonie Green, BSN

## 2018-08-09 NOTE — Progress Notes (Signed)
Per Dr. Irene Limbo, ok to treat with AST 172, ALT 109, Bili 3.2.

## 2018-08-09 NOTE — Patient Instructions (Signed)
Implanted Port Home Guide An implanted port is a type of central line that is placed under the skin. Central lines are used to provide IV access when treatment or nutrition needs to be given through a person's veins. Implanted ports are used for long-term IV access. An implanted port may be placed because:  You need IV medicine that would be irritating to the small veins in your hands or arms.  You need long-term IV medicines, such as antibiotics.  You need IV nutrition for a long period.  You need frequent blood draws for lab tests.  You need dialysis.  Implanted ports are usually placed in the chest area, but they can also be placed in the upper arm, the abdomen, or the leg. An implanted port has two main parts:  Reservoir. The reservoir is round and will appear as a small, raised area under your skin. The reservoir is the part where a needle is inserted to give medicines or draw blood.  Catheter. The catheter is a thin, flexible tube that extends from the reservoir. The catheter is placed into a large vein. Medicine that is inserted into the reservoir goes into the catheter and then into the vein.  How will I care for my incision site? Do not get the incision site wet. Bathe or shower as directed by your health care provider. How is my port accessed? Special steps must be taken to access the port:  Before the port is accessed, a numbing cream can be placed on the skin. This helps numb the skin over the port site.  Your health care provider uses a sterile technique to access the port. ? Your health care provider must put on a mask and sterile gloves. ? The skin over your port is cleaned carefully with an antiseptic and allowed to dry. ? The port is gently pinched between sterile gloves, and a needle is inserted into the port.  Only "non-coring" port needles should be used to access the port. Once the port is accessed, a blood return should be checked. This helps ensure that the port  is in the vein and is not clogged.  If your port needs to remain accessed for a constant infusion, a clear (transparent) bandage will be placed over the needle site. The bandage and needle will need to be changed every week, or as directed by your health care provider.  Keep the bandage covering the needle clean and dry. Do not get it wet. Follow your health care provider's instructions on how to take a shower or bath while the port is accessed.  If your port does not need to stay accessed, no bandage is needed over the port.  What is flushing? Flushing helps keep the port from getting clogged. Follow your health care provider's instructions on how and when to flush the port. Ports are usually flushed with saline solution or a medicine called heparin. The need for flushing will depend on how the port is used.  If the port is used for intermittent medicines or blood draws, the port will need to be flushed: ? After medicines have been given. ? After blood has been drawn. ? As part of routine maintenance.  If a constant infusion is running, the port may not need to be flushed.  How long will my port stay implanted? The port can stay in for as long as your health care provider thinks it is needed. When it is time for the port to come out, surgery will be   done to remove it. The procedure is similar to the one performed when the port was put in. When should I seek immediate medical care? When you have an implanted port, you should seek immediate medical care if:  You notice a bad smell coming from the incision site.  You have swelling, redness, or drainage at the incision site.  You have more swelling or pain at the port site or the surrounding area.  You have a fever that is not controlled with medicine.  This information is not intended to replace advice given to you by your health care provider. Make sure you discuss any questions you have with your health care provider. Document  Released: 11/08/2005 Document Revised: 04/15/2016 Document Reviewed: 07/16/2013 Elsevier Interactive Patient Education  2017 [employer]  

## 2018-08-09 NOTE — Telephone Encounter (Signed)
Scheduled appt pe r9/18 los - all appts already scheduled only added f/u in 10 days - pt aware of apts no print out wanted.

## 2018-08-10 ENCOUNTER — Ambulatory Visit
Admission: RE | Admit: 2018-08-10 | Discharge: 2018-08-10 | Disposition: A | Discharge status: Home / Self Care | Payer: BC Managed Care – PPO | Source: Ambulatory Visit | Attending: Radiation Oncology | Admitting: Radiation Oncology

## 2018-08-10 VITALS — BP 96/69 | HR 85 | Temp 99.1°F | Resp 20 | Ht 65.0 in | Wt 158.4 lb

## 2018-08-10 DIAGNOSIS — Z510 Encounter for antineoplastic radiation therapy: Secondary | ICD-10-CM | POA: Insufficient documentation

## 2018-08-10 DIAGNOSIS — C7931 Secondary malignant neoplasm of brain: Secondary | ICD-10-CM

## 2018-08-10 MED ORDER — HEPARIN SOD (PORK) LOCK FLUSH 100 UNIT/ML IV SOLN
500.0000 [IU] | Freq: Once | INTRAVENOUS | Status: AC
  Administered 2018-08-10: 500 [IU] via INTRAVENOUS

## 2018-08-10 MED ORDER — SODIUM CHLORIDE 0.9 % IJ SOLN
10.0000 mL | Freq: Once | INTRAMUSCULAR | Status: AC
  Administered 2018-08-10: 10 mL via INTRAVENOUS

## 2018-08-11 ENCOUNTER — Inpatient Hospital Stay: Payer: BC Managed Care – PPO

## 2018-08-11 VITALS — BP 114/73 | HR 79 | Temp 98.5°F | Resp 18

## 2018-08-11 DIAGNOSIS — Z95828 Presence of other vascular implants and grafts: Secondary | ICD-10-CM

## 2018-08-11 DIAGNOSIS — C787 Secondary malignant neoplasm of liver and intrahepatic bile duct: Secondary | ICD-10-CM

## 2018-08-11 DIAGNOSIS — Z5111 Encounter for antineoplastic chemotherapy: Secondary | ICD-10-CM | POA: Diagnosis not present

## 2018-08-11 DIAGNOSIS — C50919 Malignant neoplasm of unspecified site of unspecified female breast: Secondary | ICD-10-CM

## 2018-08-11 DIAGNOSIS — Z7189 Other specified counseling: Secondary | ICD-10-CM

## 2018-08-11 DIAGNOSIS — C7951 Secondary malignant neoplasm of bone: Secondary | ICD-10-CM

## 2018-08-11 MED ORDER — PEGFILGRASTIM-CBQV 6 MG/0.6ML ~~LOC~~ SOSY
PREFILLED_SYRINGE | SUBCUTANEOUS | Status: AC
  Filled 2018-08-11: qty 0.6

## 2018-08-11 MED ORDER — FULVESTRANT 250 MG/5ML IM SOLN
500.0000 mg | Freq: Once | INTRAMUSCULAR | Status: AC
  Administered 2018-08-11: 500 mg via INTRAMUSCULAR

## 2018-08-11 MED ORDER — PEGFILGRASTIM-CBQV 6 MG/0.6ML ~~LOC~~ SOSY
6.0000 mg | PREFILLED_SYRINGE | Freq: Once | SUBCUTANEOUS | Status: AC
  Administered 2018-08-11: 6 mg via SUBCUTANEOUS

## 2018-08-11 NOTE — Patient Instructions (Signed)
Pegfilgrastim injection What is this medicine? PEGFILGRASTIM (PEG fil gra stim) is a long-acting granulocyte colony-stimulating factor that stimulates the growth of neutrophils, a type of white blood cell important in the body's fight against infection. It is used to reduce the incidence of fever and infection in patients with certain types of cancer who are receiving chemotherapy that affects the bone marrow, and to increase survival after being exposed to high doses of radiation. This medicine may be used for other purposes; ask your health care provider or pharmacist if you have questions. COMMON BRAND NAME(S): Neulasta What should I tell my health care provider before I take this medicine? They need to know if you have any of these conditions: -kidney disease -latex allergy -ongoing radiation therapy -sickle cell disease -skin reactions to acrylic adhesives (On-Body Injector only) -an unusual or allergic reaction to pegfilgrastim, filgrastim, other medicines, foods, dyes, or preservatives -pregnant or trying to get pregnant -breast-feeding How should I use this medicine? This medicine is for injection under the skin. If you get this medicine at home, you will be taught how to prepare and give the pre-filled syringe or how to use the On-body Injector. Refer to the patient Instructions for Use for detailed instructions. Use exactly as directed. Tell your healthcare provider immediately if you suspect that the On-body Injector may not have performed as intended or if you suspect the use of the On-body Injector resulted in a missed or partial dose. It is important that you put your used needles and syringes in a special sharps container. Do not put them in a trash can. If you do not have a sharps container, call your pharmacist or healthcare provider to get one. Talk to your pediatrician regarding the use of this medicine in children. While this drug may be prescribed for selected conditions,  precautions do apply. Overdosage: If you think you have taken too much of this medicine contact a poison control center or emergency room at once. NOTE: This medicine is only for you. Do not share this medicine with others. What if I miss a dose? It is important not to miss your dose. Call your doctor or health care professional if you miss your dose. If you miss a dose due to an On-body Injector failure or leakage, a new dose should be administered as soon as possible using a single prefilled syringe for manual use. What may interact with this medicine? Interactions have not been studied. Give your health care provider a list of all the medicines, herbs, non-prescription drugs, or dietary supplements you use. Also tell them if you smoke, drink alcohol, or use illegal drugs. Some items may interact with your medicine. This list may not describe all possible interactions. Give your health care provider a list of all the medicines, herbs, non-prescription drugs, or dietary supplements you use. Also tell them if you smoke, drink alcohol, or use illegal drugs. Some items may interact with your medicine. What should I watch for while using this medicine? You may need blood work done while you are taking this medicine. If you are going to need a MRI, CT scan, or other procedure, tell your doctor that you are using this medicine (On-Body Injector only). What side effects may I notice from receiving this medicine? Side effects that you should report to your doctor or health care professional as soon as possible: -allergic reactions like skin rash, itching or hives, swelling of the face, lips, or tongue -dizziness -fever -pain, redness, or irritation at site   where injected -pinpoint red spots on the skin -red or dark-brown urine -shortness of breath or breathing problems -stomach or side pain, or pain at the shoulder -swelling -tiredness -trouble passing urine or change in the amount of urine Side  effects that usually do not require medical attention (report to your doctor or health care professional if they continue or are bothersome): -bone pain -muscle pain This list may not describe all possible side effects. Call your doctor for medical advice about side effects. You may report side effects to FDA at 1-800-FDA-1088. Where should I keep my medicine? Keep out of the reach of children. Store pre-filled syringes in a refrigerator between 2 and 8 degrees C (36 and 46 degrees F). Do not freeze. Keep in carton to protect from light. Throw away this medicine if it is left out of the refrigerator for more than 48 hours. Throw away any unused medicine after the expiration date. NOTE: This sheet is a summary. It may not cover all possible information. If you have questions about this medicine, talk to your doctor, pharmacist, or health care provider.  2018 Elsevier/Gold Standard (2016-11-04 12:58:03) Fulvestrant injection What is this medicine? FULVESTRANT (ful VES trant) blocks the effects of estrogen. It is used to treat breast cancer. This medicine may be used for other purposes; ask your health care provider or pharmacist if you have questions. COMMON BRAND NAME(S): FASLODEX What should I tell my health care provider before I take this medicine? They need to know if you have any of these conditions: -bleeding problems -liver disease -low levels of platelets in the blood -an unusual or allergic reaction to fulvestrant, other medicines, foods, dyes, or preservatives -pregnant or trying to get pregnant -breast-feeding How should I use this medicine? This medicine is for injection into a muscle. It is usually given by a health care professional in a hospital or clinic setting. Talk to your pediatrician regarding the use of this medicine in children. Special care may be needed. Overdosage: If you think you have taken too much of this medicine contact a poison control center or emergency  room at once. NOTE: This medicine is only for you. Do not share this medicine with others. What if I miss a dose? It is important not to miss your dose. Call your doctor or health care professional if you are unable to keep an appointment. What may interact with this medicine? -medicines that treat or prevent blood clots like warfarin, enoxaparin, and dalteparin This list may not describe all possible interactions. Give your health care provider a list of all the medicines, herbs, non-prescription drugs, or dietary supplements you use. Also tell them if you smoke, drink alcohol, or use illegal drugs. Some items may interact with your medicine. What should I watch for while using this medicine? Your condition will be monitored carefully while you are receiving this medicine. You will need important blood work done while you are taking this medicine. Do not become pregnant while taking this medicine or for at least 1 year after stopping it. Women of child-bearing potential will need to have a negative pregnancy test before starting this medicine. Women should inform their doctor if they wish to become pregnant or think they might be pregnant. There is a potential for serious side effects to an unborn child. Men should inform their doctors if they wish to father a child. This medicine may lower sperm counts. Talk to your health care professional or pharmacist for more information. Do not breast-feed an  infant while taking this medicine or for 1 year after the last dose. What side effects may I notice from receiving this medicine? Side effects that you should report to your doctor or health care professional as soon as possible: -allergic reactions like skin rash, itching or hives, swelling of the face, lips, or tongue -feeling faint or lightheaded, falls -pain, tingling, numbness, or weakness in the legs -signs and symptoms of infection like fever or chills; cough; flu-like symptoms; sore  throat -vaginal bleeding Side effects that usually do not require medical attention (report to your doctor or health care professional if they continue or are bothersome): -aches, pains -constipation -diarrhea -headache -hot flashes -nausea, vomiting -pain at site where injected -stomach pain This list may not describe all possible side effects. Call your doctor for medical advice about side effects. You may report side effects to FDA at 1-800-FDA-1088. Where should I keep my medicine? This drug is given in a hospital or clinic and will not be stored at home. NOTE: This sheet is a summary. It may not cover all possible information. If you have questions about this medicine, talk to your doctor, pharmacist, or health care provider.  2018 Elsevier/Gold Standard (2015-06-06 11:03:55)

## 2018-08-11 NOTE — Progress Notes (Signed)
  Radiation Oncology         [phone] ________________________________  Name: Stacey Cox MRN: [id number]  Date: 08/10/2018  DOB: 28-May-1956  SIMULATION AND TREATMENT PLANNING NOTE    ICD-10-CM   1. Brain metastases (HCC) C79.31 sodium chloride 0.9 % injection 10 mL    DIAGNOSIS:  62 yo woman with a symptomatic left frontal brain metastasis  NARRATIVE:  The patient was brought to the [facility].  Identity was confirmed.  All relevant records and images related to the planned course of therapy were reviewed.  The patient freely provided informed written consent to proceed with treatment after reviewing the details related to the planned course of therapy. The consent form was witnessed and verified by the simulation staff. Intravenous access was established for contrast administration. Then, the patient was set-up in a stable reproducible supine position for radiation therapy.  A relocatable thermoplastic stereotactic head frame was fabricated for precise immobilization.  CT images were obtained.  Surface markings were placed.  The CT images were loaded into the planning software and fused with the patient's targeting MRI scan.  Then the target and avoidance structures were contoured.  Treatment planning then occurred.  The radiation prescription was entered and confirmed.  I have requested 3D planning  I have requested a DVH of the following structures: Brain stem, brain, left eye, right eye, lenses, optic chiasm, target volumes, uninvolved brain, and normal tissue.    SPECIAL TREATMENT PROCEDURE:  The planned course of therapy using radiation constitutes a special treatment procedure. Special care is required in the management of this patient for the following reasons. This treatment constitutes a Special Treatment Procedure for the following reason: High dose per fraction requiring special monitoring for increased toxicities of treatment including daily imaging.  The special  nature of the planned course of radiotherapy will require increased physician supervision and oversight to ensure patient's safety with optimal treatment outcomes.  PLAN:  The patient will receive 25 Gy in 5 fractions.  ________________________________  Sheral Apley Tammi Klippel, M.D.

## 2018-08-14 ENCOUNTER — Telehealth: Payer: Self-pay

## 2018-08-14 NOTE — Telephone Encounter (Signed)
OT orders signed and faxed to [facility] at [phone]. Confirmed fax receipt 08/14/18 at 1521.

## 2018-08-16 ENCOUNTER — Ambulatory Visit
Admission: RE | Admit: 2018-08-16 | Discharge: 2018-08-16 | Disposition: A | Discharge status: Home / Self Care | Payer: BC Managed Care – PPO | Source: Ambulatory Visit | Attending: Radiation Oncology | Admitting: Radiation Oncology

## 2018-08-16 ENCOUNTER — Ambulatory Visit: Payer: BC Managed Care – PPO

## 2018-08-16 ENCOUNTER — Other Ambulatory Visit: Payer: Self-pay

## 2018-08-16 VITALS — BP 106/58 | HR 64 | Temp 98.0°F | Resp 16

## 2018-08-16 DIAGNOSIS — C7931 Secondary malignant neoplasm of brain: Principal | ICD-10-CM

## 2018-08-16 DIAGNOSIS — C50919 Malignant neoplasm of unspecified site of unspecified female breast: Secondary | ICD-10-CM

## 2018-08-16 NOTE — Progress Notes (Signed)
  Radiation Oncology         [phone]) [phone] ________________________________  Stereotactic Treatment Procedure Note  Name: Jalyah Weinheimer MRN: [id number]  Date: 08/16/2018  DOB: 1956-03-27  SPECIAL TREATMENT PROCEDURE    ICD-10-CM   1. 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  It showed 3-dimensional radiation distributions overlaid onto the planning CT/MRI image set.  The [facility] for the target structures as well as the organs at risk were reviewed. The documentation of the 3D plan and dosimetry are filed in the radiation oncology EMR.  NARRATIVE:  Karlisa Gaubert was brought to the TrueBeam stereotactic radiation treatment machine and placed supine on the CT couch. The head frame was applied, and the patient was set up for stereotactic radiosurgery.  Neurosurgery was present for the set-up and delivery  SIMULATION VERIFICATION:  In the couch zero-angle position, the patient underwent Exactrac imaging using the Brainlab system with orthogonal KV images.  These were carefully aligned and repeated to confirm treatment position for each of the isocenters.  The Exactrac snap film verification was repeated at each couch angle.  PROCEDURE: [name] received stereotactic radiosurgery to the following targets: Left 5 cm frontal target was treated using 5 Rapid Arc VMAT Beams to a prescription dose of 5 Gy.  ExacTrac registration was performed for each couch angle.  The 100% isodose line was prescribed.  6 MV X-rays were delivered in the flattening filter free beam mode.  STEREOTACTIC TREATMENT MANAGEMENT:  Following delivery, the patient was transported to nursing in stable condition and monitored for possible acute effects.  Vital signs were recorded BP (!) 106/58   Pulse 64   Temp 98 F  (36.7 C)   Resp 16   SpO2 100% . The patient tolerated treatment without significant acute effects, and was discharged to home in stable condition.    PLAN: Follow up for 4 more treatments.  ________________________________  Sheral Apley. Tammi Klippel, M.D.

## 2018-08-16 NOTE — Progress Notes (Addendum)
Nurse monitoring complete following first of five intended SRS treatments. Patient tolerate treatment well. No distress noted. A&O x 3. Denies pain. Denies any new neurologic symptoms. Patient understands to avoid strenuous activity for the next 24 hours. Also, patient understands to phone [phone] with needs or concerns associated with radiation treatment. Finally, patient understands to continue taking Decadron as ordered (1mg  once per day). No evidence of thrush noted. Patient discharged home with her sister, ambulatory, and in no distress. Patient will return on Friday for second treatment.  BP (!) 106/58   Pulse 64   Temp 98 F (36.7 C)   Resp 16   SpO2 100%  Wt Readings from Last 3 Encounters:  08/10/18 158 lb 6.4 oz (71.8 kg)  08/09/18 156 lb 9.6 oz (71 kg)  08/03/18 157 lb 9.6 oz (71.5 kg)

## 2018-08-16 NOTE — Op Note (Signed)
  Name: Stacey Cox  MRN: [id number]  Date: 08/16/2018   DOB: [birth year]  Stereotactic Radiosurgery Operative Note  PRE-OPERATIVE DIAGNOSIS:  Multiple Brain Metastases  POST-OPERATIVE DIAGNOSIS:  Multiple Brain Metastases  PROCEDURE:  Stereotactic Radiosurgery  SURGEON:  Peggyann Shoals, MD  NARRATIVE: The patient underwent a radiation treatment planning session in the radiation oncology simulation suite under the care of the radiation oncology physician and physicist.  I participated closely in the radiation treatment planning afterwards. The patient underwent planning CT which was fused to 3T high resolution MRI with 1 mm axial slices.  These images were fused on the planning system.  We contoured the gross target volumes and subsequently expanded this to yield the Planning Target Volume. I actively participated in the planning process.  I helped to define and review the target contours and also the contours of the optic pathway, eyes, brainstem and selected nearby organs at risk.  All the dose constraints for critical structures were reviewed and compared to AAPM Task Group 101.  The prescription dose conformity was reviewed.  I approved the plan electronically.    Accordingly, Arna Luis was brought to the TrueBeam stereotactic radiation treatment linac and placed in the custom immobilization mask.  The patient was aligned according to the IR fiducial markers with BrainLab Exactrac, then orthogonal x-rays were used in ExacTrac with the 6DOF robotic table and the shifts were made to align the patient  [name] received stereotactic radiosurgery uneventfully.    Lesions treated:  1   Complex lesions treated:  1 (>3.5 cm, <20mm of optic path, or within the brainstem)   The detailed description of the procedure is recorded in the radiation oncology procedure note.  I was present for the duration of the procedure.  DISPOSITION:  Following delivery, the patient was transported to  nursing in stable condition and monitored for possible acute effects to be discharged to home in stable condition with follow-up in one month.  Peggyann Shoals, MD 08/16/2018 8:37 AM

## 2018-08-18 ENCOUNTER — Ambulatory Visit
Admission: RE | Admit: 2018-08-18 | Discharge: 2018-08-18 | Disposition: A | Discharge status: Home / Self Care | Payer: BC Managed Care – PPO | Source: Ambulatory Visit | Attending: Radiation Oncology | Admitting: Radiation Oncology

## 2018-08-18 ENCOUNTER — Other Ambulatory Visit: Payer: Self-pay | Admitting: Hematology

## 2018-08-18 DIAGNOSIS — C7931 Secondary malignant neoplasm of brain: Secondary | ICD-10-CM | POA: Diagnosis not present

## 2018-08-18 NOTE — Progress Notes (Signed)
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  INTERVAL HISTORY   Stacey Cox is here for follow-up of her metastatic hormone positive HER-2 negative breast cancer. The patient's last visit with [place] was on 08/09/18. She is accompanied today by her sister. The pt reports that she is doing well overall.   The pt reports that she was able to go on a walk yesterday and denies any concerns for nausea as well. The pt notes that her fatigue is manageable and is mildly improving. She notes that she hasn't had to to use her scopolamine patch and has been eating better, though still not very well in general. She notes that she will begin drinking Boost and Ensure.   The pt notes that her left arm swelling has reduced some. She notes that she has had this swelling for the last month and denies any pain.   The pt notes that she has not had any headaches nor seizure like activity and has continued on Keppra without difficulty.   Lab results today (08/21/18) of CBC w/diff, CMP is as follows: all values are WNL except for WBC at 12.8k, RBC at 3.20, HGB at 10.4, HCT at 32.8, MCV at 102.5, RDW at 20.0, PLT at 50k, ANC at 11.1k, Potassium at 3.2, Glucose at 174, BUN at 5, Calcium at 7.9, Total Protein at 5.3, Albumin at 2.4, AST at 90, ALT at 77, Alk Phos at 777, Total Bilirubin at 1.9. 08/21/18 Magnesium is WNL at 1.9 08/21/18 Phosphorous is 1.8   On review of systems, pt reports some fatigue, eating well, left arm swelling, and denies nausea, headaches, seizure activity, abdominal pains, and any other symptoms.    MEDICAL HISTORY:  Past Medical History:  Diagnosis Date  . Cancer [facility])    Metastatic Breast Cancer    SURGICAL  HISTORY: Past Surgical History:  Procedure Laterality Date  . CHOLECYSTECTOMY    . IR IMAGING GUIDED PORT INSERTION  07/14/2018  . TONSILLECTOMY      SOCIAL HISTORY: Social History   Socioeconomic History  . Marital status: Single    Spouse name: Not on file  . Number of children: Not on file  . Years of education: Not on file  . Highest education level: Not on file  Occupational History  . Not on file  Social Needs  . Financial resource strain: Not on file  . Food insecurity:    Worry: Not on file    Inability: Not on file  . Transportation needs:    Medical: Not on file    Non-medical: Not on file  Tobacco Use  . Smoking status: Former Smoker    Packs/day: 0.00    Types: Cigarettes    Last attempt to quit: 01/21/2017    Years since quitting: 1.5  . Smokeless tobacco: Never Used  Substance and Sexual Activity  . Alcohol use: No    Alcohol/week: 0.0 standard drinks  . Drug use: No  . Sexual activity: Never  Lifestyle  . Physical activity:    Days per week: Not on file    Minutes per session: Not on file  . Stress: Not on file  Relationships  . Social connections:  Talks on phone: Not on file    Gets together: Not on file    Attends religious service: Not on file    Active member of club or organization: Not on file    Attends meetings of clubs or organizations: Not on file    Relationship status: Not on file  . Intimate partner violence:    Fear of current or ex partner: No    Emotionally abused: No    Physically abused: No    Forced sexual activity: No  Other Topics Concern  . Not on file  Social History Narrative   Lives alone. 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 Has phobia of the medical system.  Cigarette smoker 1/2 PPD for about 40 yrs Social alcohol use No drugs Professor at [employer] no children  FAMILY HISTORY: Family History  Problem Relation Age of Onset  . Cancer Mother        breast  . Cancer Sister        breast  Mother with h/o  breast cancer in her 39's Sister with breast cancer in her 67ys and later was diagnosed with multiple myeloma. (Patient is not aware of any specific breast cancer mutations present)  ALLERGIES:  is allergic to thorazine [chlorpromazine] and ribociclib.  MEDICATIONS:  Current Outpatient Medications  Medication Sig Dispense Refill  . aspirin EC 81 MG tablet Take 1 tablet (81 mg total) by mouth daily.    [name] dexamethasone (DECADRON) 4 MG tablet Take 1 tablet (4 mg total) by mouth daily. (Patient taking differently: Take 2 mg by mouth daily. ) 30 tablet 0  . escitalopram (LEXAPRO) 20 MG tablet TAKE 1 TABLET(20 MG) BY MOUTH DAILY (Patient taking differently: Take 20 mg by mouth daily as needed (anxiety). ) 30 tablet 0  . feeding supplement, ENSURE ENLIVE, (ENSURE ENLIVE) LIQD Take 237 mLs by mouth 2 (two) times daily between meals. 237 mL 12  . hydrOXYzine (ATARAX/VISTARIL) 10 MG tablet Take 2.5 tablets (25 mg total) by mouth 2 (two) times daily as needed for nausea or vomiting. (Patient not taking: Reported on 08/01/2018) 30 tablet 1  . K Phos Mono-Sod Phos Di & Mono (K-PHOS-NEUTRAL) [phone] MG TABS Take 1 tablet by mouth 2 (two) times daily. 60 tablet 0  . levETIRAcetam (KEPPRA) 750 MG tablet Take 1 tablet (750 mg total) by mouth 2 (two) times daily. 60 tablet 3  . lidocaine-prilocaine (EMLA) cream Apply to affected area once (Patient not taking: Reported on 08/03/2018) 30 g 3  . loperamide (IMODIUM) 1 MG/5ML solution Take 2 mg by mouth as needed for diarrhea or loose stools.     [name] LORazepam (ATIVAN) 0.5 MG tablet Take 1 tablet (0.5 mg total) by mouth every 6 (six) hours as needed. for anxiety 60 tablet 0  . ondansetron (ZOFRAN) 4 MG tablet Take 1 tablet (4 mg total) by mouth 2 (two) times daily. Take with Verzenio. (Patient not taking: Reported on 08/01/2018) 60 tablet 0  . pantoprazole (PROTONIX) 40 MG tablet TAKE 1 TABLET(40 MG) BY MOUTH DAILY 30 tablet 0  . potassium chloride SA (K-DUR,KLOR-CON)  20 MEQ tablet Take 1 tablet (20 mEq total) by mouth 2 (two) times daily. 30 tablet 0  . TRANSDERM-SCOP, 1.5 MG, 1 MG/3DAYS PLACE ONE PATCH ONTO THE SKIN EVERY THREE DAYS. 10 patch 0  . Vitamin D, Ergocalciferol, (DRISDOL) 50000 units CAPS capsule TAKE 1 CAPSULE BY MOUTH 3 TIMES WEEKLY 24 capsule 0   No current facility-administered medications for this visit.  REVIEW OF SYSTEMS:    A 10+ POINT REVIEW OF SYSTEMS WAS OBTAINED including neurology, dermatology, psychiatry, cardiac, respiratory, lymph, extremities, GI, GU, Musculoskeletal, constitutional, breasts, reproductive, HEENT.  All pertinent positives are noted in the HPI.  All others are negative.   PHYSICAL EXAMINATION:  ECOG PERFORMANCE STATUS: 1 - Symptomatic but completely ambulatory  Vitals:   08/21/18 0852  BP: 101/74  Pulse: 89  Resp: 18  Temp: 98 F (36.7 C)  SpO2: 100%   Filed Weights   08/21/18 0852  Weight: 151 lb 14.4 oz (68.9 kg)   .Body mass index is 25.28 kg/m.  GENERAL:alert, in no acute distress and comfortable SKIN: no acute rashes, no significant lesions EYES: conjunctiva are pink and non-injected, sclera anicteric OROPHARYNX: MMM, no exudates, no oropharyngeal erythema or ulceration NECK: supple, no JVD LYMPH:  no palpable lymphadenopathy in the cervical, axillary or inguinal regions LUNGS: clear to auscultation b/l with normal respiratory effort HEART: regular rate & rhythm ABDOMEN:  normoactive bowel sounds , RUQ tenderness to palpation without guarding or rigidity, not distended. No palpable hepatosplenomegaly.  Extremity: no pedal edema, Grade 2 left upper extremity swelling, without erythema or tenderness to palpation.  PSYCH: alert & oriented x 3 with fluent speech NEURO: no focal motor/sensory deficits   LABORATORY DATA:  I have reviewed the data as listed  . CBC Latest Ref Rng & Units 08/21/2018 08/09/2018 07/25/2018  WBC 3.9 - 10.3 K/uL 12.8(H) 9.7 6.8  Hemoglobin 11.6 - 15.9 g/dL  10.4(L) 10.4(L) 11.0(L)  Hematocrit 34.8 - 46.6 % 32.8(L) 32.2(L) 32.5(L)  Platelets 145 - 400 K/uL 50(L) 104(L) 81(L)   CBC    Component Value Date/Time   WBC 12.8 (H) 08/21/2018 0815   RBC 3.20 (L) 08/21/2018 0815   HGB 10.4 (L) 08/21/2018 0815   HGB 10.4 (L) 08/09/2018 1033   HGB 13.9 11/24/2017 1340   HCT 32.8 (L) 08/21/2018 0815   HCT 42.0 11/24/2017 1340   PLT 50 (L) 08/21/2018 0815   PLT 104 (L) 08/09/2018 1033   PLT 212 11/24/2017 1340   MCV 102.5 (H) 08/21/2018 0815   MCV 97.0 11/24/2017 1340   MCH 32.5 08/21/2018 0815   MCHC 31.7 08/21/2018 0815   RDW 20.0 (H) 08/21/2018 0815   RDW 14.1 11/24/2017 1340   LYMPHSABS 0.9 08/21/2018 0815   LYMPHSABS 1.4 11/24/2017 1340   MONOABS 0.7 08/21/2018 0815   MONOABS 0.3 11/24/2017 1340   EOSABS 0.0 08/21/2018 0815   EOSABS 0.1 11/24/2017 1340   BASOSABS 0.0 08/21/2018 0815   BASOSABS 0.1 11/24/2017 1340    . CMP Latest Ref Rng & Units 08/21/2018 08/09/2018 07/25/2018  Glucose 70 - 99 mg/dL 174(H) 155(H) 124(H)  BUN 8 - 23 mg/dL 5(L) 11 13  Creatinine 0.44 - 1.00 mg/dL 0.74 0.88 0.85  Sodium 135 - 145 mmol/L 142 142 137  Potassium 3.5 - 5.1 mmol/L 3.2(L) 3.1(L) 4.7  Chloride 98 - 111 mmol/L 110 107 104  CO2 22 - 32 mmol/L 23 25 22   Calcium 8.9 - 10.3 mg/dL 7.9(L) 8.3(L) 8.3(L)  Total Protein 6.5 - 8.1 g/dL 5.3(L) 5.3(L) 6.0(L)  Total Bilirubin 0.3 - 1.2 mg/dL 1.9(H) 3.2(H) 6.1(HH)  Alkaline Phos 38 - 126 U/L 777(H) 803(H) 565(H)  AST 15 - 41 U/L 90(H) 172(HH) 252(HH)  ALT 0 - 44 U/L 77(H) 109(H) 159(H)     07/18/18 Pathology:          RADIOGRAPHIC STUDIES: I have personally reviewed the radiological images as listed and  agreed with the findings in the report. Mr Jeri Cos Wo Contrast  Result Date: 08/07/2018 CLINICAL DATA:  62 year old female with metastatic breast cancer. Anterior left frontal dural-based metastasis in conjunction with metastatic bone disease. Two small posterior fossa extra-axial masses which  have been stable since March 2018 and favored to be benign meningioma. Stereotactic radiosurgery planning. EXAM: MRI HEAD WITHOUT AND WITH CONTRAST TECHNIQUE: Multiplanar, multiecho pulse sequences of the brain and surrounding structures were obtained without and with intravenous contrast. CONTRAST:  57m MULTIHANCE GADOBENATE DIMEGLUMINE 529 MG/ML IV SOLN COMPARISON:  07/28/2018 and earlier. FINDINGS: BRAIN New Lesions: None. Larger lesions: None. Stable or Smaller lesions: Lobulated and heterogeneously enhancing soft tissue mass along the left anterior frontal lobe convexity inseparable from broad-based overlying dural thickening is stable in size and configuration since 07/28/2018, and not significantly changed since 07/21/2018. This encompasses 49 x 28 x 27 millimeters (AP by transverse by CC). Trace associated hemosiderin or mineralization. Confluent regional left anterior hemisphere T2 and FLAIR hyperintensity compatible with vasogenic edema has not significantly improved. Stable regional mass effect including rightward midline shift of the anterior septum pellucidum by 6 millimeters. Fairly diffuse left anterior hemisphere pachymeningeal thickening persists. This extends to the midline anteriorly, with questionable mildly abnormal anterior right frontal convexity dural thickening. However, no progression since 07/21/2018. Smooth round homogeneously enhancing 12 millimeter right anterior posterior fossa extra-axial mass located near the jugular foramen appears stable since 01/22/2017. Likewise, a smaller 7 millimeter nodular extra-axial enhancing mass of the right prepontine cistern is stable and most likely benign. Other Brain findings: No superimposed restricted diffusion to suggest acute infarction. Noventriculomegaly, extra-axial fluid collection or acute intracranial hemorrhage. Stable chronic cerebellar volume loss. Stable occasional nonspecific cerebral white matter T2 and FLAIR hyperintensity.  Cervicomedullary junction and pituitary are within normal limits. Vascular: Major intracranial vascular flow voids are stable. Skull and upper cervical spine: Diffusely heterogeneous bone marrow signal compatible with widespread osseous metastatic disease. Extensive calvarial involvement. Partially visible C4 and C5 vertebral involvement. Negative visible spinal cord. Sinuses/Orbits: Continued confluent bilateral inflammatory frontal sinus opacification. Other paranasal sinuses remain well pneumatized. Orbits soft tissues remain normal. Other: Mastoid air cells remain clear. Visible internal auditory structures appear normal. Scalp soft tissues remain within normal limits. IMPRESSION: 1. Study for [facility] Cent[facility]. Stable abnormal anterior left frontal convexity compatible with pachymeningeal metastatic disease with focal brain invasion, vasogenic edema, and regional mass effect. The bulky dominant enhancing mass encompasses 49 x 28 x 27 mm, and is superimposed on widespread suspicious dural thickening in the left hemisphere and probably crossing midline anteriorly. 2. No new metastatic disease to the brain identified. Two small probable benign meningiomas in the posterior fossa remain stable since 01/22/2017. 3. Widespread calvarium bone metastases. 4. Inflammatory appearing frontal sinus disease is stable. Electronically Signed   By: HGenevie AnnM.D.   On: 08/07/2018 12:38   Mr BJeri CosWJAContrast  Result Date: 07/28/2018 CLINICAL DATA:  Metastatic breast cancer. Recent steroid treatment. Chemotherapy, no radiation to the head. EXAM: MRI HEAD WITHOUT AND WITH CONTRAST TECHNIQUE: Multiplanar, multiecho pulse sequences of the brain and surrounding structures were obtained without and with intravenous contrast. CONTRAST:  7 mL Gadovist IV COMPARISON:  MRI head 07/21/2018 FINDINGS: Brain: Extensive dural nodular enhancement in the left frontal lobe compatible with metastatic disease. There is adjacent bony metastatic  disease. Masslike enhancing tumor growing into the rib left frontal lobe measuring 4.7 x 2.0 cm appears unchanged. There is progressive white matter edema in the left  frontal lobe. 5 mm midline shift to the right is similar. No hydrocephalus. No other areas of mass or edema in the brain. There is marked cerebellar atrophy bilaterally. Negative for acute infarct. Dural based enhancing mass in the posterior fossa on the right compatible with meningioma unchanged from 01/22/2017. 6 mm enhancing mass in the prepontine cistern on the right also unchanged from 2018 and compatible with meningioma. Vascular: Normal arterial flow void Skull and upper cervical spine: Blastic metastatic disease throughout the calvarium mostly in the left frontal and parietal bones. Smaller area in the right anterior parietal bone. Metastatic disease in the cervical spine at C3, C4, and C5. Sinuses/Orbits: Mucosal edema throughout the frontal sinuses and ethmoid sinuses. Normal orbit Other: None IMPRESSION: Extensive nodular dural enhancement left frontal lobe with associated enhancing mass growing in the left frontal lobe is unchanged in size and compatible with metastatic disease. This is associated with metastatic disease to the left frontal bone. Extensive edema in the left frontal lobe has progressed in the interval. Blastic metastatic disease throughout the calvarium. Metastatic disease in the cervical spine. Enhancing mass along the floor of the posterior fossa on the right is stable and most consistent with meningioma. 6 mm enhancing mass in the prepontine cistern on the right also unchanged and compatible with meningioma. Electronically Signed   By: Franchot Gallo M.D.   On: 07/28/2018 17:25   Mr 3d Recon At Scanner  Result Date: 07/28/2018 CLINICAL DATA:  Metastatic breast cancer. Rising serum bilirubin and alkaline phosphatase levels. EXAM: MRI ABDOMEN WITHOUT AND WITH CONTRAST (INCLUDING MRCP) TECHNIQUE: Multiplanar multisequence  MR imaging of the abdomen was performed both before and after the administration of intravenous contrast. Heavily T2-weighted images of the biliary and pancreatic ducts were obtained, and three-dimensional MRCP images were rendered by post processing. CONTRAST:  7 cc Gadavist COMPARISON:  CT 07/06/2018 and 02/22/2018.  Ultrasound 07/18/2018. FINDINGS: Lower chest:  Increased atelectasis at the lung bases. Hepatobiliary: There is background hepatic steatosis and hepatomegaly. There is widespread hepatic metastatic disease (most obvious on series 7). Although more apparent on MRI, this may be progressive from recent CT. Large central component (or confluent lesions) measuring up to 17.4 x 7.2 cm on image 24/7 is similar to the most recent study. These lesions are hypervascular with heterogeneous enhancement following contrast. More than 50% of the patent parenchyma is replaced. There is no significant intrahepatic or extrahepatic biliary dilatation post cholecystectomy. Pancreas:  Atrophied without focal lesion or ductal dilatation. Spleen: Normal in size without focal abnormality. There is a small splenule inferior to the spleen which contains a stable cyst. Adrenals/Urinary Tract: Both adrenal glands appear normal. The kidneys appear stable without evidence of mass lesion or hydronephrosis. Stomach/Bowel: No evidence of bowel wall thickening, distention or surrounding inflammatory change. Vascular/Lymphatic: There are no enlarged abdominal lymph nodes. Aortic and branch vessel atherosclerosis, better seen on CT. No evidence of large vessel occlusion. Other: Ascites has progressed. There is no definite peritoneal enhancement or nodularity to confirm carcinomatosis. Musculoskeletal: Widespread osseous metastatic disease again noted, grossly stable. IMPRESSION: 1. Extensive hepatic metastatic disease, possibly progressive from recent abdominal CT. 2. No evidence of intrahepatic or extrahepatic biliary dilatation.  Elevated liver function studies may be secondary to intrahepatic cholestasis. 3. Increased ascites without peritoneal nodularity or suspicious enhancement. 4. Grossly stable widespread osseous metastatic disease. Electronically Signed   By: Richardean Sale M.D.   On: 07/28/2018 15:48   Mr Abdomen Mrcp Moise Boring Contast  Result Date: 07/28/2018 CLINICAL  DATA:  Metastatic breast cancer. Rising serum bilirubin and alkaline phosphatase levels. EXAM: MRI ABDOMEN WITHOUT AND WITH CONTRAST (INCLUDING MRCP) TECHNIQUE: Multiplanar multisequence MR imaging of the abdomen was performed both before and after the administration of intravenous contrast. Heavily T2-weighted images of the biliary and pancreatic ducts were obtained, and three-dimensional MRCP images were rendered by post processing. CONTRAST:  7 cc Gadavist COMPARISON:  CT 07/06/2018 and 02/22/2018.  Ultrasound 07/18/2018. FINDINGS: Lower chest:  Increased atelectasis at the lung bases. Hepatobiliary: There is background hepatic steatosis and hepatomegaly. There is widespread hepatic metastatic disease (most obvious on series 7). Although more apparent on MRI, this may be progressive from recent CT. Large central component (or confluent lesions) measuring up to 17.4 x 7.2 cm on image 24/7 is similar to the most recent study. These lesions are hypervascular with heterogeneous enhancement following contrast. More than 50% of the patent parenchyma is replaced. There is no significant intrahepatic or extrahepatic biliary dilatation post cholecystectomy. Pancreas:  Atrophied without focal lesion or ductal dilatation. Spleen: Normal in size without focal abnormality. There is a small splenule inferior to the spleen which contains a stable cyst. Adrenals/Urinary Tract: Both adrenal glands appear normal. The kidneys appear stable without evidence of mass lesion or hydronephrosis. Stomach/Bowel: No evidence of bowel wall thickening, distention or surrounding inflammatory  change. Vascular/Lymphatic: There are no enlarged abdominal lymph nodes. Aortic and branch vessel atherosclerosis, better seen on CT. No evidence of large vessel occlusion. Other: Ascites has progressed. There is no definite peritoneal enhancement or nodularity to confirm carcinomatosis. Musculoskeletal: Widespread osseous metastatic disease again noted, grossly stable. IMPRESSION: 1. Extensive hepatic metastatic disease, possibly progressive from recent abdominal CT. 2. No evidence of intrahepatic or extrahepatic biliary dilatation. Elevated liver function studies may be secondary to intrahepatic cholestasis. 3. Increased ascites without peritoneal nodularity or suspicious enhancement. 4. Grossly stable widespread osseous metastatic disease. Electronically Signed   By: Richardean Sale M.D.   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 Multifocal tumor in the left breast with biopsy-proven left axillary metastases.   Noted to have extensive bone metastases and pulmonary metastases. Patient was noted to have calvarial metastases but no overt parenchymal metastasis She has been noted to have good clinical response and her tumor marker levels of progressively normalized. 09/14/17 CT Chest/ad/pelvis Results discussed in details - good response.  02/22/18 CT revealed  Newly apparent 2.1 by 2.0 cm rim enhancing lesion in the right hepatic lobe suspicious for a metastatic lesion. This was not apparent on prior exams although the prior exams were all noncontrast and thus the lesion may have been present but with reduced conspicuity. There is also a small enhancing lesion further posteriorly in the right hepatic lobe which is technically nonspecific and could be a small hemangioma or a small metastatic lesion. Stable distribution and appearance of prior sclerotic osseous metastatic  disease without bony progression. 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   07/06/18 CT C/A/P which revealed New pattern of heterogeneous enhancement and enhancing ill-defined lesions in the central LEFT and RIGHT hepatic lobe at site prior enhancing lesion is highly concerning for progression of of infiltrative malignancy in the liver occupying a large portion of the RIGHT hepatic lobe and central LEFT hepatic lobe. 2. Small amount of  free fluid in the abdomen pelvis. 3. No metastatic adenopathy.4. No evidence pulmonary metastasis or lymphadenopathy. 5. Stable dense sclerotic skeletal metastasis   07/11/18 ECHO revealed LV EF of 55%-60%   07/18/18 Liver needle/core biopsy right lesion consi with Ki67 at 40%, ER/PR +ve   07/28/18 MRI Abdomen MRCP revealed Extensive hepatic metastatic disease, possibly progressive from recent abdominal CT. 2. No evidence of intrahepatic or extrahepatic biliary dilatation. Elevated liver function studies may be secondary to intrahepatic cholestasis. 3. Increased ascites without peritoneal nodularity or suspicious enhancement. 4. Grossly stable widespread osseous metastatic disease.   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 Much improved back pain.  # 3 Neutropenia Related to her Ribociclib - resolved. Patient is currently on Verzenio and has not developed any neutropenia. This is being monitored.  #4 Increased LFTs due to new liver metastases  #5 Cancer related pain - controlled - status post palliative RT to spine - no currently needing any pain medications.  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 No other new medication. Now resolved. Monitoring on increasing doses of Verzenio. No issues with recurrent rash on Verzenio 131m po BID  #7 Brain metastasis  07/20/18 CT Head revealed 1.8 x 2.4 x 3.9 cm LEFT frontal cortical based mass with possible dural  component highly concerning for metastatic disease, less likely abscess or cerebritis. Severe vasogenic edema with 5 mm LEFT to RIGHT subfalcine herniation. No ventricular entrapment. 2. 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 3. Multiple calvarial metastasis. 4. Cerebellar atrophy.    07/21/18 MRI Brain revealed  Irregular plaque-like mass, likely metastasis, measuring up to 5.1 cm, appears to originate from the left anterolateral frontal dura with invasion of the underlying frontal lobe of the brain. Mass effect from brain edema and the lesion partially effaces the frontal horn of left lateral ventricle and results in 7 mm left-to-right midline shift of anterior septum pellucidum. 2. Diffuse dural thickening over the left cerebral convexity may represent edema associated with the tumor or invasive neoplasm. 3. 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   07/28/18 MRI Brain revealed Extensive nodular dural enhancement left frontal lobe with associated enhancing mass growing in the left frontal lobe is unchanged in size and compatible with metastatic disease. This is associated with metastatic disease to the left frontal bone. Extensive edema in the left frontal lobe has progressed in the interval. Blastic metastatic disease throughout the calvarium. Metastatic disease in the cervical spine. Enhancing mass along the floor of the posterior fossa on the right is stable and most consistent with meningioma. 6 mm enhancing mass in the prepontine cistern on the right also unchanged and compatible with meningioma.   PLAN:  -Avoid tylenol  -Continue follow up with Dr. VMickeal Skinnerin Neuro-oncology and Dr. MTammi Klippelin RLuttrelltaking Claritin for Neulasta related pain -Continue with Xgeva every 4 weeks - hold 1 dose   #8 Vitamin D deficiency Vit D levels 23.1 on 3/13. Back down to 19  #9 Left upper extremity  swelling PLAN:  -Continue ergocalciferol at current dose. 50k units 3 times a week. Recommended she take this with fatty foods to improve absorption. -f/u with radiation oncology and neuro-oncology for mx of brain metastases. -Discussed pt labwork today, 08/21/18; HGB stable at 10.4, PLT decreased to 50k, ANC at 11.1k in the context of G-CSF support. AST decreased to 90, ALT decreased to 77, Alk  Phos decreased to 777, Total Bilirubin decreased to 1.9 -Continue Potassium and phosphorus replacement  -Recommended that the pt continue to eat well, drink at least 48-64 oz of water each day, and walk 20-30 minutes each day.  -Advised that pt begin consuming Boost or Ensure  -Will [place] left upper extremity -The pt has no prohibitive toxicities from continuing Doxorubicin, Cyclophosphamid at this time.  -Will look to increase dose of current regimen at liver functions are improving  -Continue Faslodex and continue Xgeva every 4 weeks -Continue Keppra  -Continue SRS radiation therapy as planned -Recommended that the pt rinse with salt and baking soda mouthwashes    US venous upper extremity today F/u for Xgeva/faslodex as per scheduled apointments RTC with Dr Irene Limbo with labs as per scheduled appointment with next cycle of chemotherapy on 09/01/2018.   All of the patients questions were answered with apparent satisfaction. The patient knows to call the clinic with any problems, questions or concerns.   The total time spent in the appt was 30 minutes and more than 50% was on counseling and direct patient cares.     Sullivan Lone MD MS AAHIVMS [facility] Hematology/Oncology Physician [facility]  (Office):       [phone] (Work cell):  [phone] (Fax):           [phone]  I, Baldwin [place], am acting as a scribe for Dr. Irene Limbo  .I have reviewed the above documentation for accuracy and completeness, and I agree with the above. [name] MD

## 2018-08-18 NOTE — Progress Notes (Signed)
[place]  arrived with ambulatory by one of the therapist post SRS to brain x two Vital signs taken BP 106/79 (BP Location: Right Arm, Patient Position: Sitting) Comment: 1300 arrival  Pulse (!) 58   Temp 98.4 F (36.9 C) (Oral)   Resp 18   SpO2 100%   Aert and oriented x 3, denies headache, dizziness or any visual problems or fatigue or pain. Stacey Cox  knows to call for any unusual symptoms, increased head ache that won't go away, increased fever > 100.5, increased vomiting, vision changes.  Understands to avoid strenuous activity for the next 24 hours and call [phone] with needs.  1315 BP 100/64 (BP Location: Right Arm, Patient Position: Sitting) Comment: 1315  Pulse 64   Temp 98 F (36.7 C) (Oral)   Resp 18   SpO2 100%  Monitor for 15 minutes alert and oriented x 3, denies headache, dizziness or any visual problems or fatigue or pain. Discharged home with her sister ambulatory upstairs to waiting area for the car.

## 2018-08-21 ENCOUNTER — Inpatient Hospital Stay: Payer: BC Managed Care – PPO

## 2018-08-21 ENCOUNTER — Ambulatory Visit
Admission: RE | Admit: 2018-08-21 | Discharge: 2018-08-21 | Disposition: A | Discharge status: Home / Self Care | Payer: BC Managed Care – PPO | Source: Ambulatory Visit | Attending: Radiation Oncology | Admitting: Radiation Oncology

## 2018-08-21 ENCOUNTER — Encounter: Payer: Self-pay | Admitting: Hematology

## 2018-08-21 ENCOUNTER — Inpatient Hospital Stay (HOSPITAL_BASED_OUTPATIENT_CLINIC_OR_DEPARTMENT_OTHER): Payer: BC Managed Care – PPO | Admitting: Hematology

## 2018-08-21 ENCOUNTER — Other Ambulatory Visit: Payer: Self-pay | Admitting: Internal Medicine

## 2018-08-21 VITALS — BP 101/74 | HR 89 | Temp 98.0°F | Resp 18 | Ht 65.0 in | Wt 151.9 lb

## 2018-08-21 DIAGNOSIS — C7951 Secondary malignant neoplasm of bone: Secondary | ICD-10-CM | POA: Diagnosis not present

## 2018-08-21 DIAGNOSIS — Z7189 Other specified counseling: Secondary | ICD-10-CM

## 2018-08-21 DIAGNOSIS — C7931 Secondary malignant neoplasm of brain: Secondary | ICD-10-CM | POA: Diagnosis not present

## 2018-08-21 DIAGNOSIS — C50919 Malignant neoplasm of unspecified site of unspecified female breast: Secondary | ICD-10-CM

## 2018-08-21 DIAGNOSIS — C50812 Malignant neoplasm of overlapping sites of left female breast: Secondary | ICD-10-CM

## 2018-08-21 DIAGNOSIS — R569 Unspecified convulsions: Secondary | ICD-10-CM

## 2018-08-21 DIAGNOSIS — C787 Secondary malignant neoplasm of liver and intrahepatic bile duct: Secondary | ICD-10-CM

## 2018-08-21 DIAGNOSIS — G893 Neoplasm related pain (acute) (chronic): Secondary | ICD-10-CM

## 2018-08-21 DIAGNOSIS — Z95828 Presence of other vascular implants and grafts: Secondary | ICD-10-CM

## 2018-08-21 DIAGNOSIS — M7989 Other specified soft tissue disorders: Secondary | ICD-10-CM

## 2018-08-21 DIAGNOSIS — E559 Vitamin D deficiency, unspecified: Secondary | ICD-10-CM

## 2018-08-21 DIAGNOSIS — C7800 Secondary malignant neoplasm of unspecified lung: Secondary | ICD-10-CM

## 2018-08-21 DIAGNOSIS — Z5111 Encounter for antineoplastic chemotherapy: Secondary | ICD-10-CM | POA: Diagnosis not present

## 2018-08-21 DIAGNOSIS — Z170 Estrogen receptor positive status [ER+]: Secondary | ICD-10-CM

## 2018-08-21 LAB — CBC WITH DIFFERENTIAL/PLATELET
BASOS PCT: 0 %
Basophils Absolute: 0 10*3/uL (ref 0.0–0.1)
Eosinophils Absolute: 0 10*3/uL (ref 0.0–0.5)
Eosinophils Relative: 0 %
HCT: 32.8 % — ABNORMAL LOW (ref 34.8–46.6)
HEMOGLOBIN: 10.4 g/dL — AB (ref 11.6–15.9)
LYMPHS ABS: 0.9 10*3/uL (ref 0.9–3.3)
Lymphocytes Relative: 7 %
MCH: 32.5 pg (ref 25.1–34.0)
MCHC: 31.7 g/dL (ref 31.5–36.0)
MCV: 102.5 fL — ABNORMAL HIGH (ref 79.5–101.0)
MONOS PCT: 6 %
Monocytes Absolute: 0.7 10*3/uL (ref 0.1–0.9)
NEUTROS ABS: 11.1 10*3/uL — AB (ref 1.5–6.5)
NEUTROS PCT: 87 %
Platelets: 50 10*3/uL — ABNORMAL LOW (ref 145–400)
RBC: 3.2 MIL/uL — ABNORMAL LOW (ref 3.70–5.45)
RDW: 20 % — ABNORMAL HIGH (ref 11.2–14.5)
WBC: 12.8 10*3/uL — ABNORMAL HIGH (ref 3.9–10.3)

## 2018-08-21 LAB — CMP (CANCER CENTER ONLY)
ALBUMIN: 2.4 g/dL — AB (ref 3.5–5.0)
ALK PHOS: 777 U/L — AB (ref 38–126)
ALT: 77 U/L — ABNORMAL HIGH (ref 0–44)
ANION GAP: 9 (ref 5–15)
AST: 90 U/L — ABNORMAL HIGH (ref 15–41)
BILIRUBIN TOTAL: 1.9 mg/dL — AB (ref 0.3–1.2)
BUN: 5 mg/dL — AB (ref 8–23)
CO2: 23 mmol/L (ref 22–32)
Calcium: 7.9 mg/dL — ABNORMAL LOW (ref 8.9–10.3)
Chloride: 110 mmol/L (ref 98–111)
Creatinine: 0.74 mg/dL (ref 0.44–1.00)
GFR, Est AFR Am: >60 mL/min (ref >60)
GFR, Estimated: >60 mL/min (ref >60)
GLUCOSE: 174 mg/dL — AB (ref 70–99)
Potassium: 3.2 mmol/L — ABNORMAL LOW (ref 3.5–5.1)
Sodium: 142 mmol/L (ref 135–145)
TOTAL PROTEIN: 5.3 g/dL — AB (ref 6.5–8.1)

## 2018-08-21 LAB — PHOSPHORUS: Phosphorus: 1.8 mg/dL — ABNORMAL LOW (ref 2.5–4.6)

## 2018-08-21 LAB — MAGNESIUM: Magnesium: 1.9 mg/dL (ref 1.7–2.4)

## 2018-08-21 MED ORDER — LEVETIRACETAM 750 MG PO TABS: 750.0000 mg | ORAL_TABLET | Freq: Two times a day (BID) | ORAL | 3 refills | Status: DC

## 2018-08-21 MED ORDER — SODIUM CHLORIDE 0.9% FLUSH
10.0000 mL | Freq: Once | INTRAVENOUS | Status: AC
  Administered 2018-08-21: 10 mL
  Filled 2018-08-21: qty 10

## 2018-08-21 MED ORDER — HEPARIN SOD (PORK) LOCK FLUSH 100 UNIT/ML IV SOLN
500.0000 [IU] | Freq: Once | INTRAVENOUS | Status: AC
  Administered 2018-08-21: 500 [IU]
  Filled 2018-08-21: qty 5

## 2018-08-21 NOTE — Patient Instructions (Signed)

## 2018-08-22 ENCOUNTER — Ambulatory Visit (HOSPITAL_COMMUNITY)
Admission: RE | Admit: 2018-08-22 | Discharge: 2018-08-22 | Disposition: A | Discharge status: Home / Self Care | Payer: BC Managed Care – PPO | Source: Ambulatory Visit | Attending: Hematology | Admitting: Hematology

## 2018-08-22 ENCOUNTER — Telehealth: Payer: Self-pay

## 2018-08-22 DIAGNOSIS — M7989 Other specified soft tissue disorders: Secondary | ICD-10-CM | POA: Diagnosis not present

## 2018-08-22 DIAGNOSIS — C50919 Malignant neoplasm of unspecified site of unspecified female breast: Secondary | ICD-10-CM | POA: Insufficient documentation

## 2018-08-22 MED ORDER — K-PHOS-NEUTRAL 155-852-130 MG PO TABS: 1.0000 | ORAL_TABLET | Freq: Two times a day (BID) | ORAL | 0 refills | Status: DC

## 2018-08-22 MED ORDER — POTASSIUM CHLORIDE CRYS ER 20 MEQ PO TBCR: 20.0000 meq | EXTENDED_RELEASE_TABLET | Freq: Two times a day (BID) | ORAL | 0 refills | Status: DC

## 2018-08-22 NOTE — Progress Notes (Signed)
Left upper extremity venous duplex completed - preliminary results - There is no evidence of a DVT or superficial thrombosis. [facility], [place] 08/22/2018 1:39 PM

## 2018-08-22 NOTE — Telephone Encounter (Signed)
Per Dr. Irene Limbo called patient to let her know that her phosphorus is low and a prescription has been sent in for replacement. Verbalized understanding. Also set up stat LUE ultrasound for today at 1:00. Patient made aware and verbalized understanding and know to check in at admitting for appointment.

## 2018-08-23 ENCOUNTER — Ambulatory Visit
Admission: RE | Admit: 2018-08-23 | Discharge: 2018-08-23 | Disposition: A | Discharge status: Home / Self Care | Payer: BC Managed Care – PPO | Source: Ambulatory Visit | Attending: Radiation Oncology | Admitting: Radiation Oncology

## 2018-08-23 ENCOUNTER — Ambulatory Visit: Payer: BC Managed Care – PPO | Admitting: Psychology

## 2018-08-23 ENCOUNTER — Encounter: Payer: Self-pay | Admitting: Radiation Oncology

## 2018-08-23 VITALS — BP 112/74 | HR 70 | Temp 98.2°F | Resp 18

## 2018-08-23 DIAGNOSIS — Z510 Encounter for antineoplastic radiation therapy: Secondary | ICD-10-CM | POA: Insufficient documentation

## 2018-08-23 DIAGNOSIS — C7931 Secondary malignant neoplasm of brain: Secondary | ICD-10-CM | POA: Diagnosis present

## 2018-08-23 DIAGNOSIS — C50919 Malignant neoplasm of unspecified site of unspecified female breast: Secondary | ICD-10-CM

## 2018-08-23 NOTE — Progress Notes (Signed)
Nurse monitoring complete following SRS treatment. Patient alert and oriented x 3. No distress noted. Reminded patient to avoid strenuous activity for the next 24 hours. Reminded patient to phone [phone] with neurologic changes before 5 pm and [phone] after 5pm. Patient verbalized understanding. Patient left the clinic ambulatory with family.   BP 112/74   Pulse 70   Temp 98.2 F (36.8 C)   Resp 18   SpO2 100%

## 2018-08-23 NOTE — Progress Notes (Signed)
  Radiation Oncology         [phone]) [phone] ________________________________  Stereotactic Treatment Procedure Note  Name: Liyla Radliff MRN: [id number]  Date: 08/23/2018  DOB: Aug 06, 1956  SPECIAL TREATMENT PROCEDURE   CURRENT FRACTION:   4  PLANNED FRACTIONS:  5   3D TREATMENT PLANNING AND DOSIMETRY:  The patient's radiation plan was reviewed and approved by neurosurgery and radiation oncology prior to treatment.  It showed 3-dimensional radiation distributions overlaid onto the planning CT/MRI image set.  The [facility] for the target structures as well as the organs at risk were reviewed. The documentation of the 3D plan and dosimetry are filed in the radiation oncology EMR.  NARRATIVE:  Milly Goggins was brought to the TrueBeam stereotactic radiation treatment machine and placed supine on the CT couch. The head frame was applied, and the patient was set up for stereotactic radiosurgery.  Neurosurgery was present for the set-up and delivery  SIMULATION VERIFICATION:  In the couch zero-angle position, the patient underwent Exactrac imaging using the Brainlab system with orthogonal KV images.  These were carefully aligned and repeated to confirm treatment position for each of the isocenters.  The Exactrac snap film verification was repeated at each couch angle.  PROCEDURE: [name] received stereotactic radiosurgery to the following targets: Left 5 cm frontal target was treated using 5 Rapid Arc VMAT Beams to a prescription dose of 5 Gy.  ExacTrac registration was performed for each couch angle.  The 100% isodose line was prescribed.  6 MV X-rays were delivered in the flattening filter free beam mode.  STEREOTACTIC TREATMENT MANAGEMENT:  Following delivery, the patient was transported to nursing in stable condition and monitored for possible acute effects.  Vital signs were recorded There were no vitals taken for this visit.. The patient tolerated treatment without significant acute effects,  and was discharged to home in stable condition.    PLAN: Patient will return Friday, 08/25/18, for her final treatment.  ________________________________  Sheral Apley. Tammi Klippel, M.D.  This document serves as a record of services personally performed by Tyler Pita, MD. It was created on his behalf by Wilburn Mylar, a trained medical scribe. The creation of this record is based on the scribe's personal observations and the provider's statements to them. This document has been checked and approved by the attending provider.

## 2018-08-24 NOTE — Progress Notes (Signed)
Disability forms successfully faxed to [place] attn: Shane Crutch at [phone]. Mailed copy to patient address on file.

## 2018-08-25 ENCOUNTER — Encounter: Payer: Self-pay | Admitting: Radiation Oncology

## 2018-08-25 ENCOUNTER — Inpatient Hospital Stay: Payer: BC Managed Care – PPO | Attending: Hematology

## 2018-08-25 ENCOUNTER — Ambulatory Visit
Admission: RE | Admit: 2018-08-25 | Discharge: 2018-08-25 | Disposition: A | Discharge status: Home / Self Care | Payer: BC Managed Care – PPO | Source: Ambulatory Visit | Attending: Radiation Oncology | Admitting: Radiation Oncology

## 2018-08-25 VITALS — BP 111/74 | HR 72 | Temp 98.4°F

## 2018-08-25 DIAGNOSIS — C7951 Secondary malignant neoplasm of bone: Secondary | ICD-10-CM | POA: Diagnosis present

## 2018-08-25 DIAGNOSIS — E559 Vitamin D deficiency, unspecified: Secondary | ICD-10-CM | POA: Insufficient documentation

## 2018-08-25 DIAGNOSIS — G893 Neoplasm related pain (acute) (chronic): Secondary | ICD-10-CM | POA: Diagnosis not present

## 2018-08-25 DIAGNOSIS — C50812 Malignant neoplasm of overlapping sites of left female breast: Secondary | ICD-10-CM | POA: Diagnosis present

## 2018-08-25 DIAGNOSIS — C7931 Secondary malignant neoplasm of brain: Secondary | ICD-10-CM | POA: Insufficient documentation

## 2018-08-25 DIAGNOSIS — C787 Secondary malignant neoplasm of liver and intrahepatic bile duct: Secondary | ICD-10-CM | POA: Insufficient documentation

## 2018-08-25 DIAGNOSIS — C7800 Secondary malignant neoplasm of unspecified lung: Secondary | ICD-10-CM | POA: Insufficient documentation

## 2018-08-25 DIAGNOSIS — Z170 Estrogen receptor positive status [ER+]: Secondary | ICD-10-CM | POA: Diagnosis not present

## 2018-08-25 DIAGNOSIS — Z5189 Encounter for other specified aftercare: Secondary | ICD-10-CM | POA: Insufficient documentation

## 2018-08-25 DIAGNOSIS — Z95828 Presence of other vascular implants and grafts: Secondary | ICD-10-CM

## 2018-08-25 DIAGNOSIS — Z5111 Encounter for antineoplastic chemotherapy: Secondary | ICD-10-CM | POA: Diagnosis not present

## 2018-08-25 DIAGNOSIS — M7989 Other specified soft tissue disorders: Secondary | ICD-10-CM | POA: Insufficient documentation

## 2018-08-25 DIAGNOSIS — Z7189 Other specified counseling: Secondary | ICD-10-CM

## 2018-08-25 MED ORDER — FULVESTRANT 250 MG/5ML IM SOLN
INTRAMUSCULAR | Status: AC
  Filled 2018-08-25: qty 10

## 2018-08-25 MED ORDER — FULVESTRANT 250 MG/5ML IM SOLN
500.0000 mg | Freq: Once | INTRAMUSCULAR | Status: AC
  Administered 2018-08-25: 500 mg via INTRAMUSCULAR

## 2018-08-25 NOTE — Progress Notes (Signed)
  Radiation Oncology         [phone] ________________________________  Stereotactic Treatment Procedure Note  Name: Stacey Cox MRN: [id number]  Date: 08/25/2018  DOB: [birth year]  SPECIAL TREATMENT PROCEDURE    ICD-10-CM   1. Brain metastases ([place]) C79.31      CURRENT FRACTION:   5  PLANNED FRACTIONS:  5   3D TREATMENT PLANNING AND DOSIMETRY:  The patient's radiation plan was reviewed and approved by neurosurgery and radiation oncology prior to treatment.  It showed 3-dimensional radiation distributions overlaid onto the planning CT/MRI image set.  The [facility] for the target structures as well as the organs at risk were reviewed. The documentation of the 3D plan and dosimetry are filed in the radiation oncology EMR.  NARRATIVE:  [name] was brought to the TrueBeam stereotactic radiation treatment machine and placed supine on the CT couch. The head frame was applied, and the patient was set up for stereotactic radiosurgery.  Neurosurgery was present for the set-up and delivery  SIMULATION VERIFICATION:  In the couch zero-angle position, the patient underwent Exactrac imaging using the Brainlab system with orthogonal KV images.  These were carefully aligned and repeated to confirm treatment position for each of the isocenters.  The Exactrac snap film verification was repeated at each couch angle.  PROCEDURE: [name] received stereotactic radiosurgery to the following targets: Left 5 cm frontal target was treated using 5 Rapid Arc VMAT Beams to a prescription dose of 5 Gy.  ExacTrac registration was performed for each couch angle.  The 100% isodose line was prescribed.  6 MV X-rays were delivered in the flattening filter free beam mode.  STEREOTACTIC TREATMENT MANAGEMENT:  Following delivery, the patient was transported to nursing in stable condition and monitored for possible acute effects.  Vital signs were recorded BP 111/74 (BP Location: Right Arm)   Pulse 72   Temp  98.4 F (36.9 C) (Oral)   SpO2 100% . The patient tolerated treatment without significant acute effects, and was discharged to home in stable condition.    PLAN: Patient has completed SRS treatment. She will follow up in 1 month.  ________________________________  Sheral Apley. Tammi Klippel, M.D.  This document serves as a record of services personally performed by Tyler Pita, MD. It was created on his behalf by Wilburn Mylar, a trained medical scribe. The creation of this record is based on the scribe's personal observations and the provider's statements to them. This document has been checked and approved by the attending provider.

## 2018-08-27 ENCOUNTER — Other Ambulatory Visit: Payer: Self-pay | Admitting: Hematology

## 2018-08-28 ENCOUNTER — Telehealth: Payer: Self-pay

## 2018-08-28 NOTE — Telephone Encounter (Signed)
Fax returned to [place] at [phone] for OT orders. Confirmed fax receipt 08/28/18 at 1030.

## 2018-08-29 ENCOUNTER — Telehealth: Payer: Self-pay

## 2018-08-29 ENCOUNTER — Other Ambulatory Visit: Payer: Self-pay

## 2018-08-29 MED ORDER — POTASSIUM CHLORIDE CRYS ER 20 MEQ PO TBCR: 20.0000 meq | EXTENDED_RELEASE_TABLET | Freq: Two times a day (BID) | ORAL | 0 refills | Status: DC

## 2018-08-29 NOTE — Telephone Encounter (Signed)
Called pt to notify of potassium refill sent to [facility]. Pt will not p/u until after visit with Dr. Irene Limbo tomorrow morning and review of lab work.

## 2018-08-30 ENCOUNTER — Inpatient Hospital Stay: Payer: BC Managed Care – PPO

## 2018-08-30 ENCOUNTER — Encounter: Payer: Self-pay | Admitting: Hematology

## 2018-08-30 ENCOUNTER — Inpatient Hospital Stay (HOSPITAL_BASED_OUTPATIENT_CLINIC_OR_DEPARTMENT_OTHER): Payer: BC Managed Care – PPO | Admitting: Hematology

## 2018-08-30 VITALS — BP 113/78 | HR 69 | Temp 97.7°F | Resp 18 | Ht 65.0 in | Wt 159.6 lb

## 2018-08-30 DIAGNOSIS — C787 Secondary malignant neoplasm of liver and intrahepatic bile duct: Secondary | ICD-10-CM

## 2018-08-30 DIAGNOSIS — C50812 Malignant neoplasm of overlapping sites of left female breast: Secondary | ICD-10-CM

## 2018-08-30 DIAGNOSIS — Z5111 Encounter for antineoplastic chemotherapy: Secondary | ICD-10-CM | POA: Diagnosis not present

## 2018-08-30 DIAGNOSIS — Z95828 Presence of other vascular implants and grafts: Secondary | ICD-10-CM

## 2018-08-30 DIAGNOSIS — G893 Neoplasm related pain (acute) (chronic): Secondary | ICD-10-CM

## 2018-08-30 DIAGNOSIS — E559 Vitamin D deficiency, unspecified: Secondary | ICD-10-CM

## 2018-08-30 DIAGNOSIS — C50919 Malignant neoplasm of unspecified site of unspecified female breast: Secondary | ICD-10-CM

## 2018-08-30 DIAGNOSIS — C7800 Secondary malignant neoplasm of unspecified lung: Secondary | ICD-10-CM | POA: Diagnosis not present

## 2018-08-30 DIAGNOSIS — C7931 Secondary malignant neoplasm of brain: Secondary | ICD-10-CM | POA: Diagnosis not present

## 2018-08-30 DIAGNOSIS — Z170 Estrogen receptor positive status [ER+]: Secondary | ICD-10-CM

## 2018-08-30 DIAGNOSIS — C7951 Secondary malignant neoplasm of bone: Secondary | ICD-10-CM

## 2018-08-30 DIAGNOSIS — R7989 Other specified abnormal findings of blood chemistry: Secondary | ICD-10-CM

## 2018-08-30 DIAGNOSIS — Z7189 Other specified counseling: Secondary | ICD-10-CM

## 2018-08-30 DIAGNOSIS — R945 Abnormal results of liver function studies: Secondary | ICD-10-CM

## 2018-08-30 LAB — CBC WITH DIFFERENTIAL/PLATELET
ABS IMMATURE GRANULOCYTES: 0.42 10*3/uL — AB (ref 0.00–0.07)
BASOS ABS: 0.1 10*3/uL (ref 0.0–0.1)
Basophils Relative: 1 %
EOS ABS: 0 10*3/uL (ref 0.0–0.5)
Eosinophils Relative: 0 %
HEMATOCRIT: 31.5 % — AB (ref 36.0–46.0)
Hemoglobin: 9.8 g/dL — ABNORMAL LOW (ref 12.0–15.0)
IMMATURE GRANULOCYTES: 3 %
LYMPHS ABS: 0.9 10*3/uL (ref 0.7–4.0)
Lymphocytes Relative: 7 %
MCH: 32.5 pg (ref 26.0–34.0)
MCHC: 31.1 g/dL (ref 30.0–36.0)
MCV: 104.3 fL — ABNORMAL HIGH (ref 80.0–100.0)
Monocytes Absolute: 1.2 10*3/uL — ABNORMAL HIGH (ref 0.1–1.0)
Monocytes Relative: 8 %
NEUTROS ABS: 11.7 10*3/uL — AB (ref 1.7–7.7)
NEUTROS PCT: 81 %
NRBC: 1.1 % — AB (ref 0.0–0.2)
PLATELETS: 124 10*3/uL — AB (ref 150–400)
RBC: 3.02 MIL/uL — ABNORMAL LOW (ref 3.87–5.11)
RDW: 19.3 % — AB (ref 11.5–15.5)
WBC: 14.2 10*3/uL — ABNORMAL HIGH (ref 4.0–10.5)

## 2018-08-30 LAB — CMP (CANCER CENTER ONLY)
ALBUMIN: 2.4 g/dL — AB (ref 3.5–5.0)
ALT: 66 U/L — ABNORMAL HIGH (ref 0–44)
ANION GAP: 8 (ref 5–15)
AST: 79 U/L — ABNORMAL HIGH (ref 15–41)
Alkaline Phosphatase: 695 U/L — ABNORMAL HIGH (ref 38–126)
BUN: 9 mg/dL (ref 8–23)
CHLORIDE: 109 mmol/L (ref 98–111)
CO2: 24 mmol/L (ref 22–32)
Calcium: 8.5 mg/dL — ABNORMAL LOW (ref 8.9–10.3)
Creatinine: 0.69 mg/dL (ref 0.44–1.00)
GFR, Estimated: >60 mL/min (ref >60)
Glucose, Bld: 146 mg/dL — ABNORMAL HIGH (ref 70–99)
POTASSIUM: 3.7 mmol/L (ref 3.5–5.1)
Sodium: 141 mmol/L (ref 135–145)
Total Bilirubin: 1.5 mg/dL — ABNORMAL HIGH (ref 0.3–1.2)
Total Protein: 5.4 g/dL — ABNORMAL LOW (ref 6.5–8.1)

## 2018-08-30 LAB — PHOSPHORUS: PHOSPHORUS: 3.3 mg/dL (ref 2.5–4.6)

## 2018-08-30 LAB — MAGNESIUM: Magnesium: 2 mg/dL (ref 1.7–2.4)

## 2018-08-30 MED ORDER — SODIUM CHLORIDE 0.9 % IV SOLN
Freq: Once | INTRAVENOUS | Status: AC
  Administered 2018-08-30: 11:00:00 via INTRAVENOUS
  Filled 2018-08-30: qty 250

## 2018-08-30 MED ORDER — SODIUM CHLORIDE 0.9 % IV SOLN
500.0000 mg/m2 | Freq: Once | INTRAVENOUS | Status: AC
  Administered 2018-08-30: 900 mg via INTRAVENOUS
  Filled 2018-08-30: qty 45

## 2018-08-30 MED ORDER — PALONOSETRON HCL INJECTION 0.25 MG/5ML
INTRAVENOUS | Status: AC
  Filled 2018-08-30: qty 5

## 2018-08-30 MED ORDER — HEPARIN SOD (PORK) LOCK FLUSH 100 UNIT/ML IV SOLN
500.0000 [IU] | Freq: Once | INTRAVENOUS | Status: DC | PRN
  Filled 2018-08-30: qty 5

## 2018-08-30 MED ORDER — PALONOSETRON HCL INJECTION 0.25 MG/5ML
0.2500 mg | Freq: Once | INTRAVENOUS | Status: AC
  Administered 2018-08-30: 0.25 mg via INTRAVENOUS

## 2018-08-30 MED ORDER — SODIUM CHLORIDE 0.9 % IV SOLN
Freq: Once | INTRAVENOUS | Status: AC
  Administered 2018-08-30: 11:00:00 via INTRAVENOUS
  Filled 2018-08-30: qty 5

## 2018-08-30 MED ORDER — DENOSUMAB 120 MG/1.7ML ~~LOC~~ SOLN
SUBCUTANEOUS | Status: AC
  Filled 2018-08-30: qty 1.7

## 2018-08-30 MED ORDER — SODIUM CHLORIDE 0.9% FLUSH
10.0000 mL | Freq: Once | INTRAVENOUS | Status: AC
  Administered 2018-08-30: 10 mL
  Filled 2018-08-30: qty 10

## 2018-08-30 MED ORDER — SODIUM CHLORIDE 0.9% FLUSH
10.0000 mL | INTRAVENOUS | Status: DC | PRN
  Filled 2018-08-30: qty 10

## 2018-08-30 MED ORDER — DOXORUBICIN HCL CHEMO IV INJECTION 2 MG/ML
30.0000 mg/m2 | Freq: Once | INTRAVENOUS | Status: AC
  Administered 2018-08-30: 54 mg via INTRAVENOUS
  Filled 2018-08-30: qty 27

## 2018-08-30 MED ORDER — DENOSUMAB 120 MG/1.7ML ~~LOC~~ SOLN
120.0000 mg | Freq: Once | SUBCUTANEOUS | Status: AC
  Administered 2018-08-30: 120 mg via SUBCUTANEOUS

## 2018-08-30 NOTE — Progress Notes (Signed)
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  INTERVAL HISTORY   Stacey Cox is here for follow-up of her metastatic hormone positive HER-2 negative breast cancer. The patient's last visit with [place] was on 08/21/18. She is accompanied today by two of her friends. The pt reports that she is doing well overall.   The pt reports that her vision has been a little blurry after completing SRS last week, which she notices more when she is tired.   She also reports that she is eating better and has gained 8 pounds in the interim. She has increased her food intake to 3 meals a day with Ensure supplements.   She notes that she fell twice in the interim, once on the stairs because her knees gave out, without injury. Her other fall was while working in her garden while bending over. She has continued to see OT to work on balance and muscle strengthening. She is currently ambulating with a cane today.  The pt also notes that her left arm is still swollen, and has not worsened since her last visit. She denies pain when opening or closing her hand. She adds that she sleeps with her arm above her head at night. Her US Venous Left upper extremity on 08/22/18 did rule out a blood clot.   She has continued to take phosphorous and potassium replacement.   Lab results today (08/30/18) of CBC w/diff, CMP, and Reticulocytes is as follows: all values are WNL except for WBC at 14.2k, RBC at 3.02, HGB at 9.8, HCT at 31.5, MCV at 104.3, RDW at 19.3, PLT at 124k, nRBC at 1.1%, ANC at 11.7k, Immature Granulocytes abs at 0.42k, Glucose at 146, Calcium at 8.5, Total Protein at 5.4, Albumin at 2.4, AST at 79, ALT at 66, Alk Phos at 695, Total Bilirubin  at 1.5. 08/30/18 Magnesium at 2.0 08/30/18 Phosphorous at 3.3   On review of systems, pt reports weight gain, eating better, occasionally blurry vision, mild ankle swelling, stable left arm swelling, and denies fevers, chills, bone pains, nausea, back pains, and any other symptoms.    MEDICAL HISTORY:  Past Medical History:  Diagnosis Date  . Cancer [facility])    Metastatic Breast Cancer    SURGICAL HISTORY: Past Surgical History:  Procedure Laterality Date  . CHOLECYSTECTOMY    . IR IMAGING GUIDED PORT INSERTION  07/14/2018  . TONSILLECTOMY      SOCIAL HISTORY: Social History   Socioeconomic History  . Marital status: Single    Spouse name: Not on file  . Number of children: Not on file  . Years of education: Not on file  . Highest education level: Not on file  Occupational History  . Not on file  Social Needs  . Financial resource strain: Not on file  . Food insecurity:    Worry: Not on file    Inability: Not on file  . Transportation needs:    Medical: Not on file    Non-medical: Not on file  Tobacco Use  . Smoking status: Current Every Day Smoker    Packs/day: 0.25    Types: Cigarettes    Last attempt to quit: 01/21/2017    Years since quitting:  1.6  . Smokeless tobacco: Never Used  Substance and Sexual Activity  . Alcohol use: No    Alcohol/week: 0.0 standard drinks  . Drug use: No  . Sexual activity: Never  Lifestyle  . Physical activity:    Days per week: Not on file    Minutes per session: Not on file  . Stress: Not on file  Relationships  . Social connections:    Talks on phone: Not on file    Gets together: Not on file    Attends religious service: Not on file    Active member of club or organization: Not on file    Attends meetings of clubs or organizations: Not on file    Relationship status: Not on file  . Intimate partner violence:    Fear of current or ex partner: No    Emotionally abused: No    Physically abused: No    Forced sexual  activity: No  Other Topics Concern  . Not on file  Social History Narrative   Lives alone. 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 Has phobia of the medical system.  Cigarette smoker 1/2 PPD for about 40 yrs Social alcohol use No drugs Professor at [employer] no children  FAMILY HISTORY: Family History  Problem Relation Age of Onset  . Cancer Mother        breast  . Cancer Sister        breast  Mother with h/o breast cancer in her 34's Sister with breast cancer in her 35ys and later was diagnosed with multiple myeloma. (Patient is not aware of any specific breast cancer mutations present)  ALLERGIES:  is allergic to thorazine [chlorpromazine] and ribociclib.  MEDICATIONS:  Current Outpatient Medications  Medication Sig Dispense Refill  . aspirin EC 81 MG tablet Take 1 tablet (81 mg total) by mouth daily.    [name] dexamethasone (DECADRON) 4 MG tablet Take 1 tablet (4 mg total) by mouth daily. (Patient taking differently: Take 2 mg by mouth daily. ) 30 tablet 0  . escitalopram (LEXAPRO) 20 MG tablet TAKE 1 TABLET(20 MG) BY MOUTH DAILY (Patient taking differently: Take 20 mg by mouth daily as needed (anxiety). ) 30 tablet 0  . feeding supplement, ENSURE ENLIVE, (ENSURE ENLIVE) LIQD Take 237 mLs by mouth 2 (two) times daily between meals. 237 mL 12  . K Phos Mono-Sod Phos Di & Mono (K-PHOS-NEUTRAL) [phone] MG TABS Take 1 tablet by mouth 2 (two) times daily. 60 tablet 0  . levETIRAcetam (KEPPRA) 750 MG tablet Take 1 tablet (750 mg total) by mouth 2 (two) times daily. 60 tablet 3  . lidocaine-prilocaine (EMLA) cream Apply to affected area once (Patient not taking: Reported on 08/03/2018) 30 g 3  . loperamide (IMODIUM) 1 MG/5ML solution Take 2 mg by mouth as needed for diarrhea or loose stools.     [name] LORazepam (ATIVAN) 0.5 MG tablet Take 1 tablet (0.5 mg total) by mouth every 6 (six) hours as needed. for anxiety 60 tablet 0  . ondansetron (ZOFRAN) 4 MG tablet Take 1 tablet (4 mg total) by  mouth 2 (two) times daily. Take with Verzenio. (Patient not taking: Reported on 08/01/2018) 60 tablet 0  . pantoprazole (PROTONIX) 40 MG tablet TAKE 1 TABLET(40 MG) BY MOUTH DAILY 30 tablet 0  . potassium chloride SA (K-DUR,KLOR-CON) 20 MEQ tablet Take 1 tablet (20 mEq total) by mouth 2 (two) times daily. 30 tablet 0  . TRANSDERM-SCOP, 1.5 MG, 1 MG/3DAYS PLACE ONE PATCH ONTO THE  SKIN EVERY THREE DAYS. (Patient not taking: Reported on 08/30/2018) 10 patch 0  . Vitamin D, Ergocalciferol, (DRISDOL) 50000 units CAPS capsule TAKE 1 CAPSULE BY MOUTH 3 TIMES WEEKLY 24 capsule 0   No current facility-administered medications for this visit.     REVIEW OF SYSTEMS:    A 10+ POINT REVIEW OF SYSTEMS WAS OBTAINED including neurology, dermatology, psychiatry, cardiac, respiratory, lymph, extremities, GI, GU, Musculoskeletal, constitutional, breasts, reproductive, HEENT.  All pertinent positives are noted in the HPI.  All others are negative.   PHYSICAL EXAMINATION:  ECOG PERFORMANCE STATUS: 1 - Symptomatic but completely ambulatory  Vitals:   08/30/18 0849  BP: 113/78  Pulse: 69  Resp: 18  Temp: 97.7 F (36.5 C)  SpO2: 99%   Filed Weights   08/30/18 0849  Weight: 159 lb 9.6 oz (72.4 kg)   .Body mass index is 26.56 kg/m.  GENERAL:alert, in no acute distress and comfortable SKIN: no acute rashes, no significant lesions EYES: conjunctiva are pink and non-injected, sclera anicteric OROPHARYNX: MMM, no exudates, no oropharyngeal erythema or ulceration NECK: supple, no JVD LYMPH:  no palpable lymphadenopathy in the cervical, axillary or inguinal regions LUNGS: clear to auscultation b/l with normal respiratory effort HEART: regular rate & rhythm ABDOMEN:  normoactive bowel sounds , RUQ tenderness to palpation without guarding or rigidity, not distended. No palpable hepatosplenomegaly.  Extremity: 1+ pedal edema, Grade 2 left upper extremity swelling without erythema or tenderness to  palpation PSYCH: alert & oriented x 3 with fluent speech NEURO: no focal motor/sensory deficits   LABORATORY DATA:  I have reviewed the data as listed  . CBC Latest Ref Rng & Units 08/30/2018 08/21/2018 08/09/2018  WBC 4.0 - 10.5 K/uL 14.2(H) 12.8(H) 9.7  Hemoglobin 12.0 - 15.0 g/dL 9.8(L) 10.4(L) 10.4(L)  Hematocrit 36.0 - 46.0 % 31.5(L) 32.8(L) 32.2(L)  Platelets 150 - 400 K/uL 124(L) 50(L) 104(L)   CBC    Component Value Date/Time   WBC 14.2 (H) 08/30/2018 0803   RBC 3.02 (L) 08/30/2018 0803   HGB 9.8 (L) 08/30/2018 0803   HGB 10.4 (L) 08/09/2018 1033   HGB 13.9 11/24/2017 1340   HCT 31.5 (L) 08/30/2018 0803   HCT 42.0 11/24/2017 1340   PLT 124 (L) 08/30/2018 0803   PLT 104 (L) 08/09/2018 1033   PLT 212 11/24/2017 1340   MCV 104.3 (H) 08/30/2018 0803   MCV 97.0 11/24/2017 1340   MCH 32.5 08/30/2018 0803   MCHC 31.1 08/30/2018 0803   RDW 19.3 (H) 08/30/2018 0803   RDW 14.1 11/24/2017 1340   LYMPHSABS 0.9 08/30/2018 0803   LYMPHSABS 1.4 11/24/2017 1340   MONOABS 1.2 (H) 08/30/2018 0803   MONOABS 0.3 11/24/2017 1340   EOSABS 0.0 08/30/2018 0803   EOSABS 0.1 11/24/2017 1340   BASOSABS 0.1 08/30/2018 0803   BASOSABS 0.1 11/24/2017 1340    . CMP Latest Ref Rng & Units 08/30/2018 08/21/2018 08/09/2018  Glucose 70 - 99 mg/dL 146(H) 174(H) 155(H)  BUN 8 - 23 mg/dL 9 5(L) 11  Creatinine 0.44 - 1.00 mg/dL 0.69 0.74 0.88  Sodium 135 - 145 mmol/L 141 142 142  Potassium 3.5 - 5.1 mmol/L 3.7 3.2(L) 3.1(L)  Chloride 98 - 111 mmol/L 109 110 107  CO2 22 - 32 mmol/L _0 Calcium 8.9 - 10.3 mg/dL 8.5(L) 7.9(L) 8.3(L)  Total Protein 6.5 - 8.1 g/dL 5.4(L) 5.3(L) 5.3(L)  Total Bilirubin 0.3 - 1.2 mg/dL 1.5(H) 1.9(H) 3.2(H)  Alkaline Phos 38 - 126 U/L 695(H)  777(H) 803(H)  AST 15 - 41 U/L 79(H) 90(H) 172(HH)  ALT 0 - 44 U/L 66(H) 77(H) 109(H)     07/18/18 Pathology:          RADIOGRAPHIC STUDIES: I have personally reviewed the radiological images as listed and agreed  with the findings in the report. Mr Jeri Cos Wo Contrast  Result Date: 08/07/2018 CLINICAL DATA:  62 year old female with metastatic breast cancer. Anterior left frontal dural-based metastasis in conjunction with metastatic bone disease. Two small posterior fossa extra-axial masses which have been stable since March 2018 and favored to be benign meningioma. Stereotactic radiosurgery planning. EXAM: MRI HEAD WITHOUT AND WITH CONTRAST TECHNIQUE: Multiplanar, multiecho pulse sequences of the brain and surrounding structures were obtained without and with intravenous contrast. CONTRAST:  19m MULTIHANCE GADOBENATE DIMEGLUMINE 529 MG/ML IV SOLN COMPARISON:  07/28/2018 and earlier. FINDINGS: BRAIN New Lesions: None. Larger lesions: None. Stable or Smaller lesions: Lobulated and heterogeneously enhancing soft tissue mass along the left anterior frontal lobe convexity inseparable from broad-based overlying dural thickening is stable in size and configuration since 07/28/2018, and not significantly changed since 07/21/2018. This encompasses 49 x 28 x 27 millimeters (AP by transverse by CC). Trace associated hemosiderin or mineralization. Confluent regional left anterior hemisphere T2 and FLAIR hyperintensity compatible with vasogenic edema has not significantly improved. Stable regional mass effect including rightward midline shift of the anterior septum pellucidum by 6 millimeters. Fairly diffuse left anterior hemisphere pachymeningeal thickening persists. This extends to the midline anteriorly, with questionable mildly abnormal anterior right frontal convexity dural thickening. However, no progression since 07/21/2018. Smooth round homogeneously enhancing 12 millimeter right anterior posterior fossa extra-axial mass located near the jugular foramen appears stable since 01/22/2017. Likewise, a smaller 7 millimeter nodular extra-axial enhancing mass of the right prepontine cistern is stable and most likely benign. Other  Brain findings: No superimposed restricted diffusion to suggest acute infarction. Noventriculomegaly, extra-axial fluid collection or acute intracranial hemorrhage. Stable chronic cerebellar volume loss. Stable occasional nonspecific cerebral white matter T2 and FLAIR hyperintensity. Cervicomedullary junction and pituitary are within normal limits. Vascular: Major intracranial vascular flow voids are stable. Skull and upper cervical spine: Diffusely heterogeneous bone marrow signal compatible with widespread osseous metastatic disease. Extensive calvarial involvement. Partially visible C4 and C5 vertebral involvement. Negative visible spinal cord. Sinuses/Orbits: Continued confluent bilateral inflammatory frontal sinus opacification. Other paranasal sinuses remain well pneumatized. Orbits soft tissues remain normal. Other: Mastoid air cells remain clear. Visible internal auditory structures appear normal. Scalp soft tissues remain within normal limits. IMPRESSION: 1. [facility]. Stable abnormal anterior left frontal convexity compatible with pachymeningeal metastatic disease with focal brain invasion, vasogenic edema, and regional mass effect. The bulky dominant enhancing mass encompasses 49 x 28 x 27 mm, and is superimposed on widespread suspicious dural thickening in the left hemisphere and probably crossing midline anteriorly. 2. No new metastatic disease to the brain identified. Two small probable benign meningiomas in the posterior fossa remain stable since 01/22/2017. 3. Widespread calvarium bone metastases. 4. Inflammatory appearing frontal sinus disease is stable. Electronically Signed   By: HGenevie AnnM.D.   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 Multifocal tumor in the left breast with biopsy-proven left axillary metastases.   Noted to have extensive  bone metastases and pulmonary metastases. Patient was noted to have calvarial metastases but no overt  parenchymal metastasis She has been noted to have good clinical response and her tumor marker levels of progressively normalized. 09/14/17 CT Chest/ad/pelvis Results discussed in details - good response.  02/22/18 CT revealed  Newly apparent 2.1 by 2.0 cm rim enhancing lesion in the right hepatic lobe suspicious for a metastatic lesion. This was not apparent on prior exams although the prior exams were all noncontrast and thus the lesion may have been present but with reduced conspicuity. There is also a small enhancing lesion further posteriorly in the right hepatic lobe which is technically nonspecific and could be a small hemangioma or a small metastatic lesion. Stable distribution and appearance of prior sclerotic osseous metastatic disease without bony progression. 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   07/06/18 CT C/A/P which revealed New pattern of heterogeneous enhancement and enhancing ill-defined lesions in the central LEFT and RIGHT hepatic lobe at site prior enhancing lesion is highly concerning for progression of of infiltrative malignancy in the liver occupying a large portion of the RIGHT hepatic lobe and central LEFT hepatic lobe. 2. Small amount of free fluid in the abdomen pelvis. 3. No metastatic adenopathy.4. No evidence pulmonary metastasis or lymphadenopathy. 5. Stable dense sclerotic skeletal metastasis   07/11/18 ECHO revealed LV EF of 55%-60%   07/18/18 Liver needle/core biopsy right lesion consi with Ki67 at 40%, ER/PR +ve   07/28/18 MRI Abdomen MRCP revealed Extensive hepatic metastatic disease, possibly progressive from recent abdominal CT. 2. No evidence of intrahepatic or extrahepatic biliary dilatation. Elevated liver function studies may be secondary to intrahepatic cholestasis. 3. Increased  ascites without peritoneal nodularity or suspicious enhancement. 4. Grossly stable widespread osseous metastatic disease.   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 Much improved back pain.  # 3 Neutropenia Related to her Ribociclib - resolved. Patient is currently on Verzenio and has not developed any neutropenia. This is being monitored.  #4 Increased LFTs due to new liver metastases  #5 Cancer related pain - controlled - status post palliative RT to spine - no currently needing any pain medications.  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 No other new medication. Now resolved. Monitoring on increasing doses of Verzenio. No issues with recurrent rash on Verzenio '150mg'$  po BID  #7 Brain metastasis  07/20/18 CT Head revealed 1.8 x 2.4 x 3.9 cm LEFT frontal cortical based mass with possible dural component highly concerning for metastatic disease, less likely abscess or cerebritis. Severe vasogenic edema with 5 mm LEFT to RIGHT subfalcine herniation. No ventricular entrapment. 2. 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 3. Multiple calvarial metastasis. 4. Cerebellar atrophy.    07/21/18 MRI Brain revealed  Irregular plaque-like mass, likely metastasis, measuring up to 5.1 cm, appears to originate from the left anterolateral frontal dura with invasion of the underlying frontal lobe of the brain. Mass effect from brain edema and the lesion partially effaces the frontal horn of left lateral ventricle and results in 7 mm left-to-right midline shift of anterior septum pellucidum. 2. Diffuse dural thickening over the left cerebral convexity may represent edema associated with the tumor or invasive neoplasm. 3. 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   07/28/18 MRI Brain revealed Extensive nodular dural enhancement left frontal lobe with associated enhancing mass growing in the left  frontal lobe is unchanged in size and  compatible with metastatic disease. This is associated with metastatic disease to the left frontal bone. Extensive edema in the left frontal lobe has progressed in the interval. Blastic metastatic disease throughout the calvarium. Metastatic disease in the cervical spine. Enhancing mass along the floor of the posterior fossa on the right is stable and most consistent with meningioma. 6 mm enhancing mass in the prepontine cistern on the right also unchanged and compatible with meningioma.   PLAN:  -Avoid tylenol  -Continue follow up with Dr. Mickeal Skinner in Neuro-oncology and Dr. Tammi Klippel in [place] taking Claritin for Neulasta related pain -Continue with Xgeva every 4 weeks - hold 1 dose   #8 Vitamin D deficiency Vit D levels 23.1 on 3/13. Back down to 19  #9 Left upper extremity swelling PLAN:  -Continue ergocalciferol at current dose. 50k units 3 times a week. Recommended she take this with fatty foods to improve absorption. -f/u with radiation oncology and neuro-oncology for mx of brain metastases. -Continue Keppra  -SRS radiation therapy as planned -Recommended that the pt rinse with salt and baking soda mouthwashes  -Discussed pt labwork today, 08/30/18; Platelets improved to 124k, Bilirubin down to 1.5, transaminases improved with AST to 79 and ALT to 66 -Phosphorous and potassium is normalized as well, and pt is okay to hold phosphorous replacement at this time and will eat more nuts and phosphorous rich foods  -[place] of left upper extremity did not find evidence of a DVT -Discussed with pt that as her liver functions have improved, would like to increase dose of Doxorubicin from 64m/m2 to 343mm2 and Cyclophosphamide from 40045m2 to 500m51m -The pt has no prohibitive toxicities from continuing C3 Doxorubicin and Cyclophosphamide at this time.   -Recommend artificial tear drops 2-4 times each day, and artificial tear ointment before bed as  well -Recommended that the pt continue to eat well, drink at least 48-64 oz of water each day, and walk 20-30 minutes each day. Continue Ensure supplement.  -Pt is in communication with Home Health -Continue Faslodex, Xgeva q4weeks - Neulasta injections with chemo-therapy. -Will see the pt back in 10 days for a toxicity check    -continue Faslodex q4weeks -continue Xgeva q4weeks -RTC with Dr KaleIrene Limbo10days with labs -Please schedule cycle 4 of chemotherapy with neulasta as ordered in 3 weeks with labs and MD visit.   All of the patients questions were answered with apparent satisfaction. The patient knows to call the clinic with any problems, questions or concerns.   The total time spent in the appt was 40 minutes and more than 50% was on counseling and direct patient cares.     GautSullivan LoneMS AAHIVMS SCH [facility] Centeratology/Oncology Physician [facility] LLCffice):       [phone] cell):  [phone]):           [phone] [place] acting as a scribe for Dr. KaleIrene Limbo have reviewed the above documentation for accuracy and completeness, and I agree with the above. .GauBrunetta Genera

## 2018-08-30 NOTE — Patient Instructions (Signed)
[facility] Discharge Instructions for Patients Receiving Chemotherapy  Today you received the following chemotherapy agents Adriamycin and Cytoxan  To help prevent nausea and vomiting after your treatment, we encourage you to take your nausea medication as directed   If you develop nausea and vomiting that is not controlled by your nausea medication, call the clinic.   BELOW ARE SYMPTOMS THAT SHOULD BE REPORTED IMMEDIATELY:  *FEVER GREATER THAN 100.5 F  *CHILLS WITH OR WITHOUT FEVER  NAUSEA AND VOMITING THAT IS NOT CONTROLLED WITH YOUR NAUSEA MEDICATION  *UNUSUAL SHORTNESS OF BREATH  *UNUSUAL BRUISING OR BLEEDING  TENDERNESS IN MOUTH AND THROAT WITH OR WITHOUT PRESENCE OF ULCERS  *URINARY PROBLEMS  *BOWEL PROBLEMS  UNUSUAL RASH Items with * indicate a potential emergency and should be followed up as soon as possible.  Feel free to call the clinic should you have any questions or concerns. The clinic phone number is [phone].  Please show the [facility] at check-in to the Emergency Department and triage nurse.   Doxorubicin (Adriamycin) injection What is this medicine? DOXORUBICIN (dox oh ROO bi sin) is a chemotherapy drug. It is used to treat many kinds of cancer like leukemia, lymphoma, neuroblastoma, sarcoma, and Wilms' tumor. It is also used to treat bladder cancer, breast cancer, lung cancer, ovarian cancer, stomach cancer, and thyroid cancer. This medicine may be used for other purposes; ask your health care provider or pharmacist if you have questions. COMMON BRAND NAME(S): Adriamycin, Adriamycin PFS, Adriamycin RDF, Rubex What should I tell my health care provider before I take this medicine? They need to know if you have any of these conditions: -heart disease -history of low blood counts caused by a medicine -liver disease -recent or ongoing radiation therapy -an unusual or allergic reaction to doxorubicin, other chemotherapy agents,  other medicines, foods, dyes, or preservatives -pregnant or trying to get pregnant -breast-feeding How should I use this medicine? This drug is given as an infusion into a vein. It is administered in a hospital or clinic by a specially trained health care professional. If you have pain, swelling, burning or any unusual feeling around the site of your injection, tell your health care professional right away. Talk to your pediatrician regarding the use of this medicine in children. Special care may be needed. Overdosage: If you think you have taken too much of this medicine contact a poison control center or emergency room at once. NOTE: This medicine is only for you. Do not share this medicine with others. What if I miss a dose? It is important not to miss your dose. Call your doctor or health care professional if you are unable to keep an appointment. What may interact with this medicine? This medicine may interact with the following medications: -6-mercaptopurine -paclitaxel -phenytoin -St. John's Wort -trastuzumab -verapamil This list may not describe all possible interactions. Give your health care provider a list of all the medicines, herbs, non-prescription drugs, or dietary supplements you use. Also tell them if you smoke, drink alcohol, or use illegal drugs. Some items may interact with your medicine. What should I watch for while using this medicine? This drug may make you feel generally unwell. This is not uncommon, as chemotherapy can affect healthy cells as well as cancer cells. Report any side effects. Continue your course of treatment even though you feel ill unless your doctor tells you to stop. There is a maximum amount of this medicine you should receive throughout your life. The amount  depends on the medical condition being treated and your overall health. Your doctor will watch how much of this medicine you receive in your lifetime. Tell your doctor if you have taken this  medicine before. You may need blood work done while you are taking this medicine. Your urine may turn red for a few days after your dose. This is not blood. If your urine is dark or brown, call your doctor. In some cases, you may be given additional medicines to help with side effects. Follow all directions for their use. Call your doctor or health care professional for advice if you get a fever, chills or sore throat, or other symptoms of a cold or flu. Do not treat yourself. This drug decreases your body's ability to fight infections. Try to avoid being around people who are sick. This medicine may increase your risk to bruise or bleed. Call your doctor or health care professional if you notice any unusual bleeding. Talk to your doctor about your risk of cancer. You may be more at risk for certain types of cancers if you take this medicine. Do not become pregnant while taking this medicine or for 6 months after stopping it. Women should inform their doctor if they wish to become pregnant or think they might be pregnant. Men should not father a child while taking this medicine and for 6 months after stopping it. There is a potential for serious side effects to an unborn child. Talk to your health care professional or pharmacist for more information. Do not breast-feed an infant while taking this medicine. This medicine has caused ovarian failure in some women and reduced sperm counts in some men This medicine may interfere with the ability to have a child. Talk with your doctor or health care professional if you are concerned about your fertility. What side effects may I notice from receiving this medicine? Side effects that you should report to your doctor or health care professional as soon as possible: -allergic reactions like skin rash, itching or hives, swelling of the face, lips, or tongue -breathing problems -chest pain -fast or irregular heartbeat -low blood counts - this medicine may  decrease the number of white blood cells, red blood cells and platelets. You may be at increased risk for infections and bleeding. -pain, redness, or irritation at site where injected -signs of infection - fever or chills, cough, sore throat, pain or difficulty passing urine -signs of decreased platelets or bleeding - bruising, pinpoint red spots on the skin, black, tarry stools, blood in the urine -swelling of the ankles, feet, hands -tiredness -weakness Side effects that usually do not require medical attention (report to your doctor or health care professional if they continue or are bothersome): -diarrhea -hair loss -mouth sores -nail discoloration or damage -nausea -red colored urine -vomiting This list may not describe all possible side effects. Call your doctor for medical advice about side effects. You may report side effects to FDA at 1-800-FDA-1088. Where should I keep my medicine? This drug is given in a hospital or clinic and will not be stored at home. NOTE: This sheet is a summary. It may not cover all possible information. If you have questions about this medicine, talk to your doctor, pharmacist, or health care provider.  2018 Elsevier/Gold Standard (2016-01-05 11:28:51)   Cyclophosphamide (Cytoxan) injection What is this medicine? CYCLOPHOSPHAMIDE (sye kloe FOSS fa mide) is a chemotherapy drug. It slows the growth of cancer cells. This medicine is used to treat many types  of cancer like lymphoma, myeloma, leukemia, breast cancer, and ovarian cancer, to name a few. This medicine may be used for other purposes; ask your health care provider or pharmacist if you have questions. COMMON BRAND NAME(S): Cytoxan, Neosar What should I tell my health care provider before I take this medicine? They need to know if you have any of these conditions: -blood disorders -history of other chemotherapy -infection -kidney disease -liver disease -recent or ongoing radiation  therapy -tumors in the bone marrow -an unusual or allergic reaction to cyclophosphamide, other chemotherapy, other medicines, foods, dyes, or preservatives -pregnant or trying to get pregnant -breast-feeding How should I use this medicine? This drug is usually given as an injection into a vein or muscle or by infusion into a vein. It is administered in a hospital or clinic by a specially trained health care professional. Talk to your pediatrician regarding the use of this medicine in children. Special care may be needed. Overdosage: If you think you have taken too much of this medicine contact a poison control center or emergency room at once. NOTE: This medicine is only for you. Do not share this medicine with others. What if I miss a dose? It is important not to miss your dose. Call your doctor or health care professional if you are unable to keep an appointment. What may interact with this medicine? This medicine may interact with the following medications: -amiodarone -amphotericin B -azathioprine -certain antiviral medicines for HIV or AIDS such as protease inhibitors (e.g., indinavir, ritonavir) and zidovudine -certain blood pressure medications such as benazepril, captopril, enalapril, fosinopril, lisinopril, moexipril, monopril, perindopril, quinapril, ramipril, trandolapril -certain cancer medications such as anthracyclines (e.g., daunorubicin, doxorubicin), busulfan, cytarabine, paclitaxel, pentostatin, tamoxifen, trastuzumab -certain diuretics such as chlorothiazide, chlorthalidone, hydrochlorothiazide, indapamide, metolazone -certain medicines that treat or prevent blood clots like warfarin -certain muscle relaxants such as succinylcholine -cyclosporine -etanercept -indomethacin -medicines to increase blood counts like filgrastim, pegfilgrastim, sargramostim -medicines used as general anesthesia -metronidazole -natalizumab This list may not describe all possible  interactions. Give your health care provider a list of all the medicines, herbs, non-prescription drugs, or dietary supplements you use. Also tell them if you smoke, drink alcohol, or use illegal drugs. Some items may interact with your medicine. What should I watch for while using this medicine? Visit your doctor for checks on your progress. This drug may make you feel generally unwell. This is not uncommon, as chemotherapy can affect healthy cells as well as cancer cells. Report any side effects. Continue your course of treatment even though you feel ill unless your doctor tells you to stop. Drink water or other fluids as directed. Urinate often, even at night. In some cases, you may be given additional medicines to help with side effects. Follow all directions for their use. Call your doctor or health care professional for advice if you get a fever, chills or sore throat, or other symptoms of a cold or flu. Do not treat yourself. This drug decreases your body's ability to fight infections. Try to avoid being around people who are sick. This medicine may increase your risk to bruise or bleed. Call your doctor or health care professional if you notice any unusual bleeding. Be careful brushing and flossing your teeth or using a toothpick because you may get an infection or bleed more easily. If you have any dental work done, tell your dentist you are receiving this medicine. You may get drowsy or dizzy. Do not drive, use machinery, or do anything  that needs mental alertness until you know how this medicine affects you. Do not become pregnant while taking this medicine or for 1 year after stopping it. Women should inform their doctor if they wish to become pregnant or think they might be pregnant. Men should not father a child while taking this medicine and for 4 months after stopping it. There is a potential for serious side effects to an unborn child. Talk to your health care professional or pharmacist for  more information. Do not breast-feed an infant while taking this medicine. This medicine may interfere with the ability to have a child. This medicine has caused ovarian failure in some women. This medicine has caused reduced sperm counts in some men. You should talk with your doctor or health care professional if you are concerned about your fertility. If you are going to have surgery, tell your doctor or health care professional that you have taken this medicine. What side effects may I notice from receiving this medicine? Side effects that you should report to your doctor or health care professional as soon as possible: -allergic reactions like skin rash, itching or hives, swelling of the face, lips, or tongue -low blood counts - this medicine may decrease the number of white blood cells, red blood cells and platelets. You may be at increased risk for infections and bleeding. -signs of infection - fever or chills, cough, sore throat, pain or difficulty passing urine -signs of decreased platelets or bleeding - bruising, pinpoint red spots on the skin, black, tarry stools, blood in the urine -signs of decreased red blood cells - unusually weak or tired, fainting spells, lightheadedness -breathing problems -dark urine -dizziness -palpitations -swelling of the ankles, feet, hands -trouble passing urine or change in the amount of urine -weight gain -yellowing of the eyes or skin Side effects that usually do not require medical attention (report to your doctor or health care professional if they continue or are bothersome): -changes in nail or skin color -hair loss -missed menstrual periods -mouth sores -nausea, vomiting This list may not describe all possible side effects. Call your doctor for medical advice about side effects. You may report side effects to FDA at 1-800-FDA-1088. Where should I keep my medicine? This drug is given in a hospital or clinic and will not be stored at  home. NOTE: This sheet is a summary. It may not cover all possible information. If you have questions about this medicine, talk to your doctor, pharmacist, or health care provider.  2018 Elsevier/Gold Standard (2012-09-22 16:22:58)

## 2018-08-30 NOTE — Addendum Note (Signed)
Encounter addended by: Tyler Pita, MD on: 08/30/2018 6:22 PM  Actions taken: Sign clinical note

## 2018-08-31 ENCOUNTER — Telehealth: Payer: Self-pay

## 2018-08-31 ENCOUNTER — Other Ambulatory Visit: Payer: Self-pay | Admitting: Hematology

## 2018-08-31 LAB — CANCER ANTIGEN 15-3: CA 15-3: 323 U/mL — ABNORMAL HIGH (ref 0.0–25.0)

## 2018-08-31 LAB — CANCER ANTIGEN 27.29: CA 27.29: 375 U/mL — ABNORMAL HIGH (ref 0.0–38.6)

## 2018-08-31 NOTE — Telephone Encounter (Signed)
Pt called this morning requesting whether Dr. Irene Limbo wanted her to stop her phosphorous or her potassium. Per Dr. Irene Limbo, pt to stop taking her phosphorous. Called pt back with information and pt performed readback to stop phosphorous and continue taking potassium per MD orders. Pt thankful for information.

## 2018-08-31 NOTE — Progress Notes (Signed)
  Radiation Oncology         [phone] ________________________________  Name: Stacey Cox MRN: [id number]  Date: 08/25/2018  DOB: August 01, 1956  SRS End of Treatment Note  Diagnosis:   62 y.o. female with a symptomatic left frontal brain metastasis    Indication for treatment:  palliative       Radiation treatment dates:   08/16/2018, 08/18/2018, 08/21/2018, 08/23/2018, 08/25/2018  Site/dose:   Brain PTV1: Left anterior frontal lobe 55mm // 25 Gy in 5 fractions, Max dose = 129.1%  Beams/energy:   ExacTrac SBRT/SRT-VMAT, 5 VMAT beams // 6FFF Photon  Narrative: The patient tolerated radiation treatment well.   There were no signs of acute toxicity after treatment.  Plan: The patient has completed radiation treatment. The patient will return to radiation oncology clinic for routine followup in one month. I advised the patient to call or return sooner if they have any questions or concerns related to their recovery or treatment. ________________________________   Tyler Pita, MD [facility] Director and Director of Stereotactic Radiosurgery Direct Dial: [phone]  Fax: [phone] [email or web address]  Skype  [email or web address]  This document serves as a record of services personally performed by Tyler Pita, MD. It was created on his behalf by Rae Lips, a trained medical scribe. The creation of this record is based on the scribe's personal observations and the provider's statements to them. This document has been checked and approved by the attending provider.

## 2018-09-01 ENCOUNTER — Ambulatory Visit: Payer: Self-pay

## 2018-09-01 ENCOUNTER — Other Ambulatory Visit: Payer: Self-pay

## 2018-09-01 ENCOUNTER — Emergency Department (HOSPITAL_COMMUNITY)
Admission: EM | Admit: 2018-09-01 | Discharge: 2018-09-01 | Disposition: A | Discharge status: Home / Self Care | Payer: BC Managed Care – PPO | Attending: Emergency Medicine | Admitting: Emergency Medicine

## 2018-09-01 ENCOUNTER — Inpatient Hospital Stay: Payer: BC Managed Care – PPO

## 2018-09-01 ENCOUNTER — Encounter (HOSPITAL_COMMUNITY): Payer: Self-pay

## 2018-09-01 VITALS — BP 112/71 | HR 71 | Temp 97.7°F | Resp 18

## 2018-09-01 DIAGNOSIS — C7951 Secondary malignant neoplasm of bone: Secondary | ICD-10-CM | POA: Insufficient documentation

## 2018-09-01 DIAGNOSIS — C7931 Secondary malignant neoplasm of brain: Secondary | ICD-10-CM | POA: Diagnosis not present

## 2018-09-01 DIAGNOSIS — R609 Edema, unspecified: Secondary | ICD-10-CM | POA: Insufficient documentation

## 2018-09-01 DIAGNOSIS — C787 Secondary malignant neoplasm of liver and intrahepatic bile duct: Secondary | ICD-10-CM | POA: Diagnosis not present

## 2018-09-01 DIAGNOSIS — Z853 Personal history of malignant neoplasm of breast: Secondary | ICD-10-CM | POA: Diagnosis not present

## 2018-09-01 DIAGNOSIS — Z7982 Long term (current) use of aspirin: Secondary | ICD-10-CM | POA: Diagnosis not present

## 2018-09-01 DIAGNOSIS — F17210 Nicotine dependence, cigarettes, uncomplicated: Secondary | ICD-10-CM | POA: Insufficient documentation

## 2018-09-01 DIAGNOSIS — Z79899 Other long term (current) drug therapy: Secondary | ICD-10-CM | POA: Diagnosis not present

## 2018-09-01 DIAGNOSIS — Z5111 Encounter for antineoplastic chemotherapy: Secondary | ICD-10-CM | POA: Diagnosis not present

## 2018-09-01 LAB — I-STAT CHEM 8, ED
BUN: 13 mg/dL (ref 8–23)
CHLORIDE: 105 mmol/L (ref 98–111)
CREATININE: 0.5 mg/dL (ref 0.44–1.00)
Calcium, Ion: 1.16 mmol/L (ref 1.15–1.40)
Glucose, Bld: 110 mg/dL — ABNORMAL HIGH (ref 70–99)
HEMATOCRIT: 30 % — AB (ref 36.0–46.0)
Hemoglobin: 10.2 g/dL — ABNORMAL LOW (ref 12.0–15.0)
POTASSIUM: 4.1 mmol/L (ref 3.5–5.1)
SODIUM: 139 mmol/L (ref 135–145)
TCO2: 25 mmol/L (ref 22–32)

## 2018-09-01 MED ORDER — POTASSIUM CHLORIDE ER 10 MEQ PO TBCR: 10.0000 meq | EXTENDED_RELEASE_TABLET | Freq: Every day | ORAL | 0 refills | Status: DC

## 2018-09-01 MED ORDER — PEGFILGRASTIM-CBQV 6 MG/0.6ML ~~LOC~~ SOSY
PREFILLED_SYRINGE | SUBCUTANEOUS | Status: AC
  Filled 2018-09-01: qty 0.6

## 2018-09-01 MED ORDER — PEGFILGRASTIM-CBQV 6 MG/0.6ML ~~LOC~~ SOSY
6.0000 mg | PREFILLED_SYRINGE | Freq: Once | SUBCUTANEOUS | Status: AC
  Administered 2018-09-01: 6 mg via SUBCUTANEOUS

## 2018-09-01 MED ORDER — FUROSEMIDE 40 MG PO TABS
20.0000 mg | ORAL_TABLET | Freq: Once | ORAL | Status: AC
  Administered 2018-09-01: 20 mg via ORAL
  Filled 2018-09-01: qty 1

## 2018-09-01 MED ORDER — FUROSEMIDE 20 MG PO TABS: 20.0000 mg | ORAL_TABLET | Freq: Every day | ORAL | 0 refills | Status: DC

## 2018-09-01 NOTE — ED Notes (Signed)
Additional blood tubes sent down to lab for additional future orders.

## 2018-09-01 NOTE — ED Notes (Signed)
Blood draw attempted in right hand, unsuccessful

## 2018-09-01 NOTE — ED Provider Notes (Addendum)
[facility] DEPT Provider Note   CSN: [id number] Arrival date & time: 09/01/18  1757     History   Chief Complaint Chief Complaint  Patient presents with  . chemo patient  . Joint Swelling    HPI Zillah Alexie is a 62 y.o. female.  62 year old female presents with 2 days of worsening lower extremity edema.  She denies any pain to her lower extremities.  Denies any swelling to her calves or thighs.  Denies any chest pain or shortness of breath.  No numbness to her feet.  Patient currently being treated with chemotherapy.  Swelling has been persistent and nothing makes it better no treatment used prior to arrival.     Past Medical History:  Diagnosis Date  . Cancer [facility])    Metastatic Breast Cancer    Patient Active Problem List   Diagnosis Date Noted  . Seizures ([place]) 07/21/2018  . Macrocytic anemia 07/21/2018  . Thrombocytopenia ([place]) 07/21/2018  . Mild renal insufficiency 07/21/2018  . Anxiety 07/21/2018  . Brain metastases ([place])   . Malignant neoplasm of female breast ([place])   . Breast cancer metastasized to brain ([place]) 07/20/2018  . Port-A-Cath in place 07/17/2018  . Liver metastases ([place]) 07/12/2018  . Genetic testing 08/08/2017  . Palliative care status   . Counseling regarding advance care planning and goals of care 03/25/2017  . 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 Nausea without vomiting 03/21/2017  . Bone metastases ([place]) 02/01/2017  . 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 Malignant neoplasm metastatic to lung ([place])   . Cancer associated pain   . Metastatic breast cancer ([place]) 01/21/2017  . Pathologic fracture   . Breast mass, left     Past Surgical History:  Procedure Laterality Date  . CHOLECYSTECTOMY    . IR IMAGING GUIDED PORT INSERTION  07/14/2018  . TONSILLECTOMY       OB History   None      Home Medications    Prior to Admission medications   Medication Sig Start Date End Date Taking? Authorizing  Provider  aspirin EC 81 MG tablet Take 1 tablet (81 mg total) by mouth daily. 08/01/17   [name], MD  dexamethasone (DECADRON) 4 MG tablet Take 1 tablet (4 mg total) by mouth daily. Patient taking differently: Take 2 mg by mouth daily.  07/22/18   Sheikh, Omair Latif, DO  escitalopram (LEXAPRO) 20 MG tablet TAKE 1 TABLET(20 MG) BY MOUTH DAILY Patient taking differently: Take 20 mg by mouth daily as needed (anxiety).  06/19/18   [name], MD  feeding supplement, ENSURE ENLIVE, (ENSURE ENLIVE) LIQD Take 237 mLs by mouth 2 (two) times daily between meals. 07/22/18   Raiford Noble Latif, DO  K Phos Mono-Sod Phos Di & Mono (K-PHOS-NEUTRAL) [phone] MG TABS Take 1 tablet by mouth 2 (two) times daily. 08/22/18 09/21/18  [name], MD  levETIRAcetam (KEPPRA) 750 MG tablet Take 1 tablet (750 mg total) by mouth 2 (two) times daily. 08/21/18   Ventura Sellers, MD  lidocaine-prilocaine (EMLA) cream Apply to affected area once Patient not taking: Reported on 08/03/2018 07/12/18   [name], MD  loperamide (IMODIUM) 1 MG/5ML solution Take 2 mg by mouth as needed for diarrhea or loose stools.     [provider]  LORazepam (ATIVAN) 0.5 MG tablet Take 1 tablet (0.5 mg total) by mouth every 6 (six) hours as needed. for anxiety 03/13/18   Lebron Conners Marinell Blight, MD  ondansetron (ZOFRAN) 4 MG tablet Take 1 tablet (4 mg total) by mouth 2 (two) times daily. Take with Verzenio. Patient not taking: Reported on 08/01/2018 01/24/18   [name], MD  pantoprazole (PROTONIX) 40 MG tablet TAKE 1 TABLET(40 MG) BY MOUTH DAILY 08/18/18   [name], MD  potassium chloride SA (K-DUR,KLOR-CON) 20 MEQ tablet Take 1 tablet (20 mEq total) by mouth 2 (two) times daily. 08/29/18   [name], MD  TRANSDERM-SCOP, 1.5 MG, 1 MG/3DAYS PLACE ONE PATCH ONTO THE SKIN EVERY THREE DAYS. 09/01/18   [name], MD  Vitamin D, Ergocalciferol, (DRISDOL) 50000 units  CAPS capsule TAKE 1 CAPSULE BY MOUTH 3 TIMES WEEKLY 05/07/18   [name], MD    Family History Family History  Problem Relation Age of Onset  . Cancer Mother        breast  . Cancer Sister        breast    Social History Social History   Tobacco Use  . Smoking status: Current Every Day Smoker    Packs/day: 0.15    Types: Cigarettes    Last attempt to quit: 01/21/2017    Years since quitting: 1.6  . Smokeless tobacco: Never Used  Substance Use Topics  . Alcohol use: No    Alcohol/week: 0.0 standard drinks  . Drug use: No     Allergies   Thorazine [chlorpromazine] and Ribociclib   Review of Systems Review of Systems  All other systems reviewed and are negative.    Physical Exam Updated Vital Signs BP 117/71   Pulse 69   Temp 98.4 F (36.9 C) (Oral)   Resp 12   Ht 1.651 m (5\' 5" )   Wt 70.8 kg   SpO2 100%   BMI 25.96 kg/m   Physical Exam  Constitutional: She is oriented to person, place, and time. She appears well-developed and well-nourished.  Non-toxic appearance. No distress.  HENT:  Head: Normocephalic and atraumatic.  Eyes: Pupils are equal, round, and reactive to light. Conjunctivae, EOM and lids are normal.  Neck: Normal range of motion. Neck supple. No tracheal deviation present. No thyroid mass present.  Cardiovascular: Normal rate, regular rhythm and normal heart sounds. Exam reveals no gallop.  No murmur heard. Pulmonary/Chest: Effort normal and breath sounds normal. No stridor. No respiratory distress. She has no decreased breath sounds. She has no wheezes. She has no rhonchi. She has no rales.  Abdominal: Soft. Normal appearance and bowel sounds are normal. She exhibits no distension. There is no tenderness. There is no rebound and no CVA tenderness.  Musculoskeletal: Normal range of motion. She exhibits no edema or tenderness.  Lymphadenopathy:  1+ bilateral lower extremity pitting edema Neurovascular status intact at both feet    Neurological: She is alert and oriented to person, place, and time. She has normal strength. No cranial nerve deficit or sensory deficit. GCS eye subscore is 4. GCS verbal subscore is 5. GCS motor subscore is 6.  Skin: Skin is warm and dry. No abrasion and no rash noted.  Psychiatric: She has a normal mood and affect. Her speech is normal and behavior is normal.  Nursing note and vitals reviewed.    ED Treatments / Results  Labs (all labs ordered are listed, but only abnormal results are displayed) Labs Reviewed  I-STAT CHEM 8, ED    EKG None  Radiology No results found.  Procedures Procedures (including critical care time)  Medications Ordered in ED Medications -  No data to display   Initial Impression / Assessment and Plan / ED Course  I have reviewed the triage vital signs and the nursing notes.  Pertinent labs & imaging results that were available during my care of the patient were reviewed by me and considered in my medical decision making (see chart for details).     Patient with normal renal function here.  Low suspicion for DVT given that she has pitting edema.  Is bilateral as well.  Will prescribe short course of diuretics and she will follow with her doctor  Final Clinical Impressions(s) / ED Diagnoses   Final diagnoses:  None    ED Discharge Orders    None       Lacretia Leigh, MD 09/01/18 2159    Lacretia Leigh, MD 09/01/18 2200

## 2018-09-02 ENCOUNTER — Other Ambulatory Visit: Payer: Self-pay | Admitting: Hematology

## 2018-09-03 ENCOUNTER — Other Ambulatory Visit: Payer: Self-pay | Admitting: Hematology

## 2018-09-04 ENCOUNTER — Telehealth: Payer: Self-pay

## 2018-09-04 NOTE — Telephone Encounter (Signed)
Fax sent to [facility] [phone] with MD signature approving OT orders. Confirmed fax receipt 09/01/18 at 1640.

## 2018-09-05 ENCOUNTER — Other Ambulatory Visit: Payer: Self-pay | Admitting: Hematology

## 2018-09-08 ENCOUNTER — Other Ambulatory Visit: Payer: Self-pay

## 2018-09-08 MED ORDER — FUROSEMIDE 20 MG PO TABS: 20.0000 mg | ORAL_TABLET | Freq: Every day | ORAL | 0 refills | Status: DC

## 2018-09-11 ENCOUNTER — Inpatient Hospital Stay: Payer: BC Managed Care – PPO

## 2018-09-11 ENCOUNTER — Inpatient Hospital Stay (HOSPITAL_BASED_OUTPATIENT_CLINIC_OR_DEPARTMENT_OTHER): Payer: BC Managed Care – PPO | Admitting: Hematology

## 2018-09-11 VITALS — BP 114/73 | HR 71 | Temp 98.0°F | Resp 18 | Ht 65.0 in | Wt 168.0 lb

## 2018-09-11 DIAGNOSIS — C787 Secondary malignant neoplasm of liver and intrahepatic bile duct: Secondary | ICD-10-CM

## 2018-09-11 DIAGNOSIS — C50812 Malignant neoplasm of overlapping sites of left female breast: Secondary | ICD-10-CM | POA: Diagnosis not present

## 2018-09-11 DIAGNOSIS — C7931 Secondary malignant neoplasm of brain: Secondary | ICD-10-CM | POA: Diagnosis not present

## 2018-09-11 DIAGNOSIS — Z5111 Encounter for antineoplastic chemotherapy: Secondary | ICD-10-CM | POA: Diagnosis not present

## 2018-09-11 DIAGNOSIS — C7800 Secondary malignant neoplasm of unspecified lung: Secondary | ICD-10-CM

## 2018-09-11 DIAGNOSIS — C50919 Malignant neoplasm of unspecified site of unspecified female breast: Secondary | ICD-10-CM

## 2018-09-11 DIAGNOSIS — G893 Neoplasm related pain (acute) (chronic): Secondary | ICD-10-CM

## 2018-09-11 DIAGNOSIS — Z170 Estrogen receptor positive status [ER+]: Secondary | ICD-10-CM

## 2018-09-11 DIAGNOSIS — C7951 Secondary malignant neoplasm of bone: Secondary | ICD-10-CM

## 2018-09-11 DIAGNOSIS — E559 Vitamin D deficiency, unspecified: Secondary | ICD-10-CM

## 2018-09-11 DIAGNOSIS — M7989 Other specified soft tissue disorders: Secondary | ICD-10-CM

## 2018-09-11 LAB — CMP (CANCER CENTER ONLY)
ALT: 53 U/L — AB (ref 0–44)
AST: 65 U/L — AB (ref 15–41)
Albumin: 2.6 g/dL — ABNORMAL LOW (ref 3.5–5.0)
Alkaline Phosphatase: 579 U/L — ABNORMAL HIGH (ref 38–126)
Anion gap: 7 (ref 5–15)
BILIRUBIN TOTAL: 1 mg/dL (ref 0.3–1.2)
BUN: 9 mg/dL (ref 8–23)
CHLORIDE: 108 mmol/L (ref 98–111)
CO2: 26 mmol/L (ref 22–32)
CREATININE: 0.75 mg/dL (ref 0.44–1.00)
Calcium: 8.4 mg/dL — ABNORMAL LOW (ref 8.9–10.3)
GFR, Est AFR Am: >60 mL/min (ref >60)
Glucose, Bld: 130 mg/dL — ABNORMAL HIGH (ref 70–99)
Potassium: 4.1 mmol/L (ref 3.5–5.1)
Sodium: 141 mmol/L (ref 135–145)
Total Protein: 5.6 g/dL — ABNORMAL LOW (ref 6.5–8.1)

## 2018-09-11 LAB — CBC WITH DIFFERENTIAL/PLATELET
Abs Immature Granulocytes: 1.53 10*3/uL — ABNORMAL HIGH (ref 0.00–0.07)
Basophils Absolute: 0.1 10*3/uL (ref 0.0–0.1)
Basophils Relative: 1 %
EOS PCT: 0 %
Eosinophils Absolute: 0 10*3/uL (ref 0.0–0.5)
HEMATOCRIT: 31 % — AB (ref 36.0–46.0)
HEMOGLOBIN: 9.7 g/dL — AB (ref 12.0–15.0)
Immature Granulocytes: 11 %
Lymphocytes Relative: 5 %
Lymphs Abs: 0.8 10*3/uL (ref 0.7–4.0)
MCH: 33.1 pg (ref 26.0–34.0)
MCHC: 31.3 g/dL (ref 30.0–36.0)
MCV: 105.8 fL — ABNORMAL HIGH (ref 80.0–100.0)
MONO ABS: 1.2 10*3/uL — AB (ref 0.1–1.0)
Monocytes Relative: 8 %
Neutro Abs: 10.6 10*3/uL — ABNORMAL HIGH (ref 1.7–7.7)
Neutrophils Relative %: 75 %
Platelets: 62 10*3/uL — ABNORMAL LOW (ref 150–400)
RBC: 2.93 MIL/uL — AB (ref 3.87–5.11)
RDW: 18.1 % — ABNORMAL HIGH (ref 11.5–15.5)
WBC: 14.1 10*3/uL — AB (ref 4.0–10.5)
nRBC: 1.1 % — ABNORMAL HIGH (ref 0.0–0.2)

## 2018-09-11 LAB — MAGNESIUM: MAGNESIUM: 2 mg/dL (ref 1.7–2.4)

## 2018-09-11 LAB — PHOSPHORUS: Phosphorus: 3.1 mg/dL (ref 2.5–4.6)

## 2018-09-11 NOTE — Progress Notes (Signed)
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  INTERVAL HISTORY   Stacey Cox is here for follow-up of her metastatic hormone positive HER-2 negative breast cancer. The patient's last visit with [place] was on 08/30/18. She is accompanied today by her friend and her nephew. The pt reports that she is doing well overall.   The pt reports that her leg swelling has improved while taking Lasix. She notes that she has been eating much better in the interim and has gained weight as well. She notes that her nausea has improved.   The pt notes that she has begun using a compression sleeve for her left arm swelling.   She will continue follow up with Dr. Tammi Klippel in [place] and Dr. Mickeal Skinner in neuro-onc in November and December respectively.   Lab results today (09/11/18) of CBC w/diff, CMP, and Reticulocytes is as follows: all values are WNL except for WBC at 14.1k, RBC at 2.93, HGB at 9.7, HCT at 31.0, MCV at 105.8, RDW at 18.1, PLT at 62k, nRBC at 1.1%, ANC at 10.6k, Monocytes abs at 1.2k, Glucose at 130, Calcium at 8.4, Total Protein at 5.6, Albumin at 2.6, AST at 65, ALT at 53, Alk Phos at 579. 09/11/18 Phosphorous is WNL at 3.1 09/11/18 Magnesium is WNL at 2.0  On review of systems, pt reports improved leg swelling, improved appetite, eating well, weight gain, breathing well, and denies nausea, mouth sores, and any other symptoms.    MEDICAL HISTORY:  Past Medical History:  Diagnosis Date  . Cancer [facility])    Metastatic Breast Cancer    SURGICAL HISTORY: Past Surgical History:  Procedure Laterality Date  . CHOLECYSTECTOMY    . IR IMAGING GUIDED PORT INSERTION  07/14/2018  . TONSILLECTOMY      SOCIAL HISTORY: Social  History   Socioeconomic History  . Marital status: Single    Spouse name: Not on file  . Number of children: Not on file  . Years of education: Not on file  . Highest education level: Not on file  Occupational History  . Not on file  Social Needs  . Financial resource strain: Not on file  . Food insecurity:    Worry: Not on file    Inability: Not on file  . Transportation needs:    Medical: Not on file    Non-medical: Not on file  Tobacco Use  . Smoking status: Current Every Day Smoker    Packs/day: 0.15    Types: Cigarettes    Last attempt to quit: 01/21/2017    Years since quitting: 1.6  . Smokeless tobacco: Never Used  Substance and Sexual Activity  . Alcohol use: No    Alcohol/week: 0.0 standard drinks  . Drug use: No  . Sexual activity: Never  Lifestyle  . Physical activity:    Days per week: Not on file    Minutes per session: Not on file  . Stress: Not on file  Relationships  . Social connections:    Talks on phone: Not on file    Gets together: Not on file    Attends religious service: Not on file    Active member of club or organization: Not on file  Attends meetings of clubs or organizations: Not on file    Relationship status: Not on file  . Intimate partner violence:    Fear of current or ex partner: No    Emotionally abused: No    Physically abused: No    Forced sexual activity: No  Other Topics Concern  . Not on file  Social History Narrative   Lives alone. 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 Has phobia of the medical system.  Cigarette smoker 1/2 PPD for about 40 yrs Social alcohol use No drugs Professor at [employer] no children  FAMILY HISTORY: Family History  Problem Relation Age of Onset  . Cancer Mother        breast  . Cancer Sister        breast  Mother with h/o breast cancer in her 59's Sister with breast cancer in her 57ys and later was diagnosed with multiple myeloma. (Patient is not aware of any specific breast cancer  mutations present)  ALLERGIES:  is allergic to thorazine [chlorpromazine] and ribociclib.  MEDICATIONS:  Current Outpatient Medications  Medication Sig Dispense Refill  . aspirin EC 81 MG tablet Take 1 tablet (81 mg total) by mouth daily.    [name] dexamethasone (DECADRON) 4 MG tablet Take 1 tablet (4 mg total) by mouth daily. (Patient taking differently: Take 2 mg by mouth daily. ) 30 tablet 0  . escitalopram (LEXAPRO) 20 MG tablet TAKE 1 TABLET(20 MG) BY MOUTH DAILY 30 tablet 0  . feeding supplement, ENSURE ENLIVE, (ENSURE ENLIVE) LIQD Take 237 mLs by mouth 2 (two) times daily between meals. (Patient taking differently: Take 237-474 mLs by mouth 2 (two) times daily between meals. ) 237 mL 12  . furosemide (LASIX) 20 MG tablet Take 1 tablet (20 mg total) by mouth daily. 5 tablet 0  . levETIRAcetam (KEPPRA) 750 MG tablet Take 1 tablet (750 mg total) by mouth 2 (two) times daily. 60 tablet 3  . lidocaine-prilocaine (EMLA) cream Apply to affected area once 30 g 3  . loperamide (IMODIUM) 1 MG/5ML solution Take 2 mg by mouth as needed for diarrhea or loose stools.     . pantoprazole (PROTONIX) 40 MG tablet TAKE 1 TABLET(40 MG) BY MOUTH DAILY (Patient taking differently: Take 40 mg by mouth daily. ) 30 tablet 0  . potassium chloride (K-DUR) 10 MEQ tablet Take 1 tablet (10 mEq total) by mouth daily. 5 tablet 0  . potassium chloride SA (K-DUR,KLOR-CON) 20 MEQ tablet Take 1 tablet (20 mEq total) by mouth 2 (two) times daily. 30 tablet 0  . Vitamin D, Ergocalciferol, (DRISDOL) 50000 units CAPS capsule TAKE 1 CAPSULE BY MOUTH 3 TIMES WEEKLY (Patient taking differently: Take 50,000 Units by mouth. TAKE 1 CAPSULE BY MOUTH 3 TIMES WEEKLY) 24 capsule 0   No current facility-administered medications for this visit.     REVIEW OF SYSTEMS:    A 10+ POINT REVIEW OF SYSTEMS WAS OBTAINED including neurology, dermatology, psychiatry, cardiac, respiratory, lymph, extremities, GI, GU, Musculoskeletal, constitutional,  breasts, reproductive, HEENT.  All pertinent positives are noted in the HPI.  All others are negative.   PHYSICAL EXAMINATION:  ECOG PERFORMANCE STATUS: 1 - Symptomatic but completely ambulatory  Vitals:   09/11/18 1511  BP: 114/73  Pulse: 71  Resp: 18  Temp: 98 F (36.7 C)  SpO2: 99%   Filed Weights   09/11/18 1511  Weight: 168 lb (76.2 kg)   .Body mass index is 27.96 kg/m.  GENERAL:alert, in no acute distress and  comfortable SKIN: no acute rashes, no significant lesions EYES: conjunctiva are pink and non-injected, sclera anicteric OROPHARYNX: MMM, no exudates, no oropharyngeal erythema or ulceration NECK: supple, no JVD LYMPH:  no palpable lymphadenopathy in the cervical, axillary or inguinal regions LUNGS: clear to auscultation b/l with normal respiratory effort HEART: regular rate & rhythm ABDOMEN:  normoactive bowel sounds, RUQ tenderness to palpation without guarding or rigidity, not distended. No palpable hepatosplenomegaly.  Extremity: 1+ pedal edema, Grade 2 left upper extremity swelling without erythema or tenderness to palpation PSYCH: alert & oriented x 3 with fluent speech NEURO: no focal motor/sensory deficits   LABORATORY DATA:  I have reviewed the data as listed  . CBC Latest Ref Rng & Units 09/11/2018 09/01/2018 08/30/2018  WBC 4.0 - 10.5 K/uL 14.1(H) - 14.2(H)  Hemoglobin 12.0 - 15.0 g/dL 9.7(L) 10.2(L) 9.8(L)  Hematocrit 36.0 - 46.0 % 31.0(L) 30.0(L) 31.5(L)  Platelets 150 - 400 K/uL 62(L) - 124(L)   CBC    Component Value Date/Time   WBC 14.1 (H) 09/11/2018 1414   RBC 2.93 (L) 09/11/2018 1414   HGB 9.7 (L) 09/11/2018 1414   HGB 10.4 (L) 08/09/2018 1033   HGB 13.9 11/24/2017 1340   HCT 31.0 (L) 09/11/2018 1414   HCT 42.0 11/24/2017 1340   PLT 62 (L) 09/11/2018 1414   PLT 104 (L) 08/09/2018 1033   PLT 212 11/24/2017 1340   MCV 105.8 (H) 09/11/2018 1414   MCV 97.0 11/24/2017 1340   MCH 33.1 09/11/2018 1414   MCHC 31.3 09/11/2018 1414   RDW  18.1 (H) 09/11/2018 1414   RDW 14.1 11/24/2017 1340   LYMPHSABS 0.8 09/11/2018 1414   LYMPHSABS 1.4 11/24/2017 1340   MONOABS 1.2 (H) 09/11/2018 1414   MONOABS 0.3 11/24/2017 1340   EOSABS 0.0 09/11/2018 1414   EOSABS 0.1 11/24/2017 1340   BASOSABS 0.1 09/11/2018 1414   BASOSABS 0.1 11/24/2017 1340    . CMP Latest Ref Rng & Units 09/11/2018 09/01/2018 08/30/2018  Glucose 70 - 99 mg/dL 130(H) 110(H) 146(H)  BUN 8 - 23 mg/dL 9 13 9   Creatinine 0.44 - 1.00 mg/dL 0.75 0.50 0.69  Sodium 135 - 145 mmol/L 141 139 141  Potassium 3.5 - 5.1 mmol/L 4.1 4.1 3.7  Chloride 98 - 111 mmol/L 108 105 109  CO2 22 - 32 mmol/L 26 - 24  Calcium 8.9 - 10.3 mg/dL 8.4(L) - 8.5(L)  Total Protein 6.5 - 8.1 g/dL 5.6(L) - 5.4(L)  Total Bilirubin 0.3 - 1.2 mg/dL 1.0 - 1.5(H)  Alkaline Phos 38 - 126 U/L 579(H) - 695(H)  AST 15 - 41 U/L 65(H) - 79(H)  ALT 0 - 44 U/L 53(H) - 66(H)     07/18/18 Pathology:          RADIOGRAPHIC STUDIES: I have personally reviewed the radiological images as listed and agreed with the findings in the report. No results found.  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 Multifocal tumor in the left breast with biopsy-proven left axillary metastases.   Noted to have extensive bone metastases and pulmonary metastases. Patient was noted to have calvarial metastases but no overt parenchymal metastasis She has been noted to have good clinical response and her tumor marker levels of progressively normalized. 09/14/17 CT Chest/ad/pelvis Results discussed in details - good response.  02/22/18 CT revealed  Newly apparent 2.1 by 2.0 cm rim enhancing lesion  in the right hepatic lobe suspicious for a metastatic lesion. This was not apparent on prior exams although the prior exams were all noncontrast and thus the lesion may have been present but with reduced conspicuity.  There is also a small enhancing lesion further posteriorly in the right hepatic lobe which is technically nonspecific and could be a small hemangioma or a small metastatic lesion. Stable distribution and appearance of prior sclerotic osseous metastatic disease without bony progression. 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   07/06/18 CT C/A/P which revealed New pattern of heterogeneous enhancement and enhancing ill-defined lesions in the central LEFT and RIGHT hepatic lobe at site prior enhancing lesion is highly concerning for progression of of infiltrative malignancy in the liver occupying a large portion of the RIGHT hepatic lobe and central LEFT hepatic lobe. 2. Small amount of free fluid in the abdomen pelvis. 3. No metastatic adenopathy.4. No evidence pulmonary metastasis or lymphadenopathy. 5. Stable dense sclerotic skeletal metastasis   07/11/18 ECHO revealed LV EF of 55%-60%   07/18/18 Liver needle/core biopsy right lesion consi with Ki67 at 40%, ER/PR +ve   07/28/18 MRI Abdomen MRCP revealed Extensive hepatic metastatic disease, possibly progressive from recent abdominal CT. 2. No evidence of intrahepatic or extrahepatic biliary dilatation. Elevated liver function studies may be secondary to intrahepatic cholestasis. 3. Increased ascites without peritoneal nodularity or suspicious enhancement. 4. Grossly stable widespread osseous metastatic disease.   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 Much improved back pain.  # 3 Neutropenia Related to her Ribociclib - resolved. Patient is currently on Verzenio and has not developed any neutropenia. This is being monitored.  #4 Increased LFTs due to new liver metastases  #5 Cancer related pain - controlled - status post palliative RT to spine - no currently needing any pain medications.  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 No other  new medication. Now resolved. Monitoring on increasing doses of Verzenio. No issues with recurrent rash on Verzenio 151m po BID  #7 Brain metastasis  07/20/18 CT Head revealed 1.8 x 2.4 x 3.9 cm LEFT frontal cortical based mass with possible dural component highly concerning for metastatic disease, less likely abscess or cerebritis. Severe vasogenic edema with 5 mm LEFT to RIGHT subfalcine herniation. No ventricular entrapment. 2. 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 3. Multiple calvarial metastasis. 4. Cerebellar atrophy.    07/21/18 MRI Brain revealed  Irregular plaque-like mass, likely metastasis, measuring up to 5.1 cm, appears to originate from the left anterolateral frontal dura with invasion of the underlying frontal lobe of the brain. Mass effect from brain edema and the lesion partially effaces the frontal horn of left lateral ventricle and results in 7 mm left-to-right midline shift of anterior septum pellucidum. 2. Diffuse dural thickening over the left cerebral convexity may represent edema associated with the tumor or invasive neoplasm. 3. 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   07/28/18 MRI Brain revealed Extensive nodular dural enhancement left frontal lobe with associated enhancing mass growing in the left frontal lobe is unchanged in size and compatible with metastatic disease. This is associated with metastatic disease to the left frontal bone. Extensive edema in the left frontal lobe has progressed in the interval. Blastic metastatic disease throughout the calvarium. Metastatic disease in the cervical spine. Enhancing mass along the  floor of the posterior fossa on the right is stable and most consistent with meningioma. 6 mm enhancing mass in the prepontine cistern on the right also unchanged and compatible with meningioma.   PLAN:  -Continue follow up with Dr. Mickeal Skinner in Neuro-oncology and Dr.  Tammi Klippel in [place] taking Claritin for Neulasta related pain -Continue with Delton See every 4 weeks   #8 Vitamin D deficiency Vit D levels 23.1 on 3/13. Back down to 19  #9 Left upper extremity swelling- [place] neg for DVT PLAN:   -Discussed pt labwork today, 09/11/18; Bilirubin has normalized to 1.0, transaminases are improved, Alk Phos improved, HGB stable at 9.7, PLT lower at 62k, no neutropenia. Electrolytes normal.  -08/30/18 CA15-3 improved to 323 and 08/30/18 CA 27.29 improved to 375 -Will pursue 4-6 cycles, and considering following this with Taxol -Continue potassium replacement while on Lasix, hold when finished -Recommended that the pt continue to eat well, drink at least 48-64 oz of water each day, and walk 20-30 minutes each day.   -The pt has no prohibitive toxicities from continuing with C4 of Doxorubicin and Cyclophosphamide on 09/20/18  at this time.  -Continue Neulasta injections -Continue Faslodex and Xgeva every 4 weeks -Continue ergocalciferol at current dose. 50k units 3 times a week. Recommended she take this with fatty foods to improve absorption. -f/u with radiation oncology and neuro-oncology for mx of brain metastases. -Continue Keppra  -s/p SRS radiation therapy  -will adjust up [facility] chemotherapy dosing based on liver function -Pt is in communication with Home Health  -F/u as per scheduled appointments on 09/20/2018 for chemorx, labs and MD   All of the patients questions were answered with apparent satisfaction. The patient knows to call the clinic with any problems, questions or concerns.   The total time spent in the appt was 30 minutes and more than 50% was on counseling and direct patient cares.     Sullivan Lone MD MS AAHIVMS [facility] Hematology/Oncology Physician [facility]  (Office):       [phone] (Work cell):  [phone] (Fax):           [phone]  I, Baldwin Jamaica, am acting as a scribe for Dr. Irene Limbo  .I have  reviewed the above documentation for accuracy and completeness, and I agree with the above. [name] MD

## 2018-09-12 ENCOUNTER — Telehealth: Payer: Self-pay

## 2018-09-12 NOTE — Telephone Encounter (Signed)
Pt to be receiving xgeva and faslodex monthly. Confirmed okay for pt to receive on 09/22/18 and 10/20/18. Pt additionally scheduled for injection on 11/17/18. Appt notes updated to include xgeva and faslodex. Confirmed orders in supportive care.

## 2018-09-12 NOTE — Telephone Encounter (Signed)
F/u as per scheduled appointments on 09/20/2018 for chemorx, labs and MD. Per 10/21 los

## 2018-09-13 ENCOUNTER — Ambulatory Visit: Payer: BC Managed Care – PPO

## 2018-09-16 ENCOUNTER — Other Ambulatory Visit: Payer: Self-pay | Admitting: Hematology

## 2018-09-17 ENCOUNTER — Other Ambulatory Visit: Payer: Self-pay | Admitting: Hematology

## 2018-09-19 ENCOUNTER — Other Ambulatory Visit: Payer: Self-pay | Admitting: Internal Medicine

## 2018-09-19 MED ORDER — DEXAMETHASONE 2 MG PO TABS: 2.0000 mg | ORAL_TABLET | Freq: Every day | ORAL | 2 refills | Status: DC

## 2018-09-19 NOTE — Progress Notes (Signed)
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  INTERVAL HISTORY   Stacey Cox is here for follow-up of her metastatic hormone positive HER-2 negative breast cancer. The patient's last visit with [place] was on 09/11/18. She is accompanied today by her sister and friend. The pt reports that she is doing well overall.   The pt reports that her leg swelling has reduced since beginning Lasix. She does not a small wound on the back of her right leg, just above the ankle and denies any plant or animal exposure to the area. She notes that she has been wearing a compression sleeve on her left arm and notes that this has helped resolve some of her previous swelling.   The pt notes that she has a mildly runny nose and endorses some sniffling, denies fevers and sneezing or concerns for infection.   The pt has continued to take claritin when she receives her Neulasta shot and denies any bone pains.   The pt notes that she is not having any nausea, is eating well, and is at her good baseline weight at 163 pounds.   The pt continues on 28m Decadron under the management and care of my colleague Dr. VMickeal Skinnerin Neuro-onc.   Lab results today (09/20/18) of CBC w/diff, CMP is as follows: all values are WNL except for WBC at 11.1k, RBC at 3.16, HGB at 10.5, HCT at 32.9, MCV at 104.1, RDW at 17.8, PLT at 139k, nRBC at 0.9%, ANC at 9.1k, Abs immature granulocytes at 0.15k, Potassium at 3.4, Glucose at 190, Calcium at 8.3, Total Protein at 5.4, Albumin at 2.5, AST at 46, Alk Phos at 435. 09/20/18 Phosphorous at 2.8  On review of systems, pt reports mild runny nose, stable energy levels, resolved leg swelling, reduced left arm swelling, and denies  sneezing, cough, fever, concerns for infections, bone pains, problems with the port, abdominal pains, and any other symptoms.   MEDICAL HISTORY:  Past Medical History:  Diagnosis Date  . Cancer ([facility]    Metastatic Breast Cancer    SURGICAL HISTORY: Past Surgical History:  Procedure Laterality Date  . CHOLECYSTECTOMY    . IR IMAGING GUIDED PORT INSERTION  07/14/2018  . TONSILLECTOMY      SOCIAL HISTORY: Social History   Socioeconomic History  . Marital status: Single    Spouse name: Not on file  . Number of children: Not on file  . Years of education: Not on file  . Highest education level: Not on file  Occupational History  . Not on file  Social Needs  . Financial resource strain: Not on file  . Food insecurity:    Worry: Not on file    Inability: Not on file  . Transportation needs:    Medical: Not on file    Non-medical: Not on file  Tobacco Use  . Smoking status: Current Every Day Smoker    Packs/day: 0.15    Types: Cigarettes    Last attempt to quit: 01/21/2017    Years since quitting: 1.6  . Smokeless tobacco: Never Used  Substance and Sexual Activity  . Alcohol use: No    Alcohol/week: 0.0 standard drinks  . Drug use: No  .  Sexual activity: Never  Lifestyle  . Physical activity:    Days per week: Not on file    Minutes per session: Not on file  . Stress: Not on file  Relationships  . Social connections:    Talks on phone: Not on file    Gets together: Not on file    Attends religious service: Not on file    Active member of club or organization: Not on file    Attends meetings of clubs or organizations: Not on file    Relationship status: Not on file  . Intimate partner violence:    Fear of current or ex partner: No    Emotionally abused: No    Physically abused: No    Forced sexual activity: No  Other Topics Concern  . Not on file  Social History Narrative   Lives alone. 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 Has phobia of the medical system.    Cigarette smoker 1/2 PPD for about 40 yrs Social alcohol use No drugs Professor at [employer] no children  FAMILY HISTORY: Family History  Problem Relation Age of Onset  . Cancer Mother        breast  . Cancer Sister        breast  Mother with h/o breast cancer in her 16's Sister with breast cancer in her 33ys and later was diagnosed with multiple myeloma. (Patient is not aware of any specific breast cancer mutations present)  ALLERGIES:  is allergic to thorazine [chlorpromazine] and ribociclib.  MEDICATIONS:  Current Outpatient Medications  Medication Sig Dispense Refill  . aspirin EC 81 MG tablet Take 1 tablet (81 mg total) by mouth daily.    [name] dexamethasone (DECADRON) 2 MG tablet Take 1 tablet (2 mg total) by mouth daily. 30 tablet 2  . escitalopram (LEXAPRO) 20 MG tablet TAKE 1 TABLET(20 MG) BY MOUTH DAILY 30 tablet 0  . feeding supplement, ENSURE ENLIVE, (ENSURE ENLIVE) LIQD Take 237 mLs by mouth 2 (two) times daily between meals. (Patient taking differently: Take 237-474 mLs by mouth 2 (two) times daily between meals. ) 237 mL 12  . furosemide (LASIX) 20 MG tablet Take 1 tablet (20 mg total) by mouth daily. 5 tablet 0  . levETIRAcetam (KEPPRA) 750 MG tablet Take 1 tablet (750 mg total) by mouth 2 (two) times daily. 60 tablet 3  . lidocaine-prilocaine (EMLA) cream Apply to affected area once 30 g 3  . loperamide (IMODIUM) 1 MG/5ML solution Take 2 mg by mouth as needed for diarrhea or loose stools.     . pantoprazole (PROTONIX) 40 MG tablet TAKE 1 TABLET(40 MG) BY MOUTH DAILY 30 tablet 1  . potassium chloride (K-DUR) 10 MEQ tablet Take 1 tablet (10 mEq total) by mouth daily. 5 tablet 0  . potassium chloride SA (K-DUR,KLOR-CON) 20 MEQ tablet TAKE 1 TABLET(20 MEQ) BY MOUTH TWICE DAILY 30 tablet 0  . Vitamin D, Ergocalciferol, (DRISDOL) 50000 units CAPS capsule TAKE 1 CAPSULE BY MOUTH 3 TIMES WEEKLY (Patient taking differently: Take 50,000 Units by mouth. TAKE 1 CAPSULE BY  MOUTH 3 TIMES WEEKLY) 24 capsule 0   No current facility-administered medications for this visit.     REVIEW OF SYSTEMS:    A 10+ POINT REVIEW OF SYSTEMS WAS OBTAINED including neurology, dermatology, psychiatry, cardiac, respiratory, lymph, extremities, GI, GU, Musculoskeletal, constitutional, breasts, reproductive, HEENT.  All pertinent positives are noted in the HPI.  All others are negative.   PHYSICAL EXAMINATION:  ECOG PERFORMANCE STATUS: 1 -  Symptomatic but completely ambulatory  Vitals:   09/20/18 1041  BP: 101/77  Pulse: 79  Resp: 18  Temp: 98.4 F (36.9 C)  SpO2: 100%   Filed Weights   09/20/18 1041  Weight: 163 lb (73.9 kg)   .Body mass index is 27.12 kg/m.  GENERAL:alert, in no acute distress and comfortable SKIN: no acute rashes, no significant lesions EYES: conjunctiva are pink and non-injected, sclera anicteric OROPHARYNX: MMM, no exudates, no oropharyngeal erythema or ulceration NECK: supple, no JVD LYMPH:  no palpable lymphadenopathy in the cervical, axillary or inguinal regions LUNGS: clear to auscultation b/l with normal respiratory effort HEART: regular rate & rhythm ABDOMEN:  normoactive bowel sounds , RUQ tenderness to palpation without guarding or rigidity, not distended. No palpable hepatosplenomegaly.  Extremity: 1+ pedal edema, Grade 2 left upper extremity swelling without erythema or tenderness to palpation PSYCH: alert & oriented x 3 with fluent speech NEURO: no focal motor/sensory deficits    LABORATORY DATA:  I have reviewed the data as listed  . CBC Latest Ref Rng & Units 09/20/2018 09/11/2018 09/01/2018  WBC 4.0 - 10.5 K/uL 11.1(H) 14.1(H) -  Hemoglobin 12.0 - 15.0 g/dL 10.5(L) 9.7(L) 10.2(L)  Hematocrit 36.0 - 46.0 % 32.9(L) 31.0(L) 30.0(L)  Platelets 150 - 400 K/uL 139(L) 62(L) -   CBC    Component Value Date/Time   WBC 11.1 (H) 09/20/2018 0838   RBC 3.16 (L) 09/20/2018 0838   HGB 10.5 (L) 09/20/2018 0838   HGB 10.4 (L)  08/09/2018 1033   HGB 13.9 11/24/2017 1340   HCT 32.9 (L) 09/20/2018 0838   HCT 42.0 11/24/2017 1340   PLT 139 (L) 09/20/2018 0838   PLT 104 (L) 08/09/2018 1033   PLT 212 11/24/2017 1340   MCV 104.1 (H) 09/20/2018 0838   MCV 97.0 11/24/2017 1340   MCH 33.2 09/20/2018 0838   MCHC 31.9 09/20/2018 0838   RDW 17.8 (H) 09/20/2018 0838   RDW 14.1 11/24/2017 1340   LYMPHSABS 1.0 09/20/2018 0838   LYMPHSABS 1.4 11/24/2017 1340   MONOABS 0.8 09/20/2018 0838   MONOABS 0.3 11/24/2017 1340   EOSABS 0.0 09/20/2018 0838   EOSABS 0.1 11/24/2017 1340   BASOSABS 0.1 09/20/2018 0838   BASOSABS 0.1 11/24/2017 1340    . CMP Latest Ref Rng & Units 09/20/2018 09/11/2018 09/01/2018  Glucose 70 - 99 mg/dL 190(H) 130(H) 110(H)  BUN 8 - 23 mg/dL 9 9 13   Creatinine 0.44 - 1.00 mg/dL 0.75 0.75 0.50  Sodium 135 - 145 mmol/L 141 141 139  Potassium 3.5 - 5.1 mmol/L 3.4(L) 4.1 4.1  Chloride 98 - 111 mmol/L 108 108 105  CO2 22 - 32 mmol/L 24 26 -  Calcium 8.9 - 10.3 mg/dL 8.3(L) 8.4(L) -  Total Protein 6.5 - 8.1 g/dL 5.4(L) 5.6(L) -  Total Bilirubin 0.3 - 1.2 mg/dL 0.9 1.0 -  Alkaline Phos 38 - 126 U/L 435(H) 579(H) -  AST 15 - 41 U/L 46(H) 65(H) -  ALT 0 - 44 U/L 42 53(H) -       07/18/18 Pathology:          RADIOGRAPHIC STUDIES: I have personally reviewed the radiological images as listed and agreed with the findings in the report. Vas [place] Upper Extremity Venous Duplex  Result Date: 08/22/2018 UPPER VENOUS STUDY  Indications: Swelling of the left upper extremity Risk Factors: Cancer breast Patient noticed the swelling three weeks ago after having a seizure with no known trauma. She was seen by her physician  and it was decided that if the swelling did not go away further testing would need to be completed. Performing Technologist: Toma Copier RVS  Examination Guidelines: A complete evaluation includes B-mode imaging, spectral Doppler, color Doppler, and power Doppler as needed of all  accessible portions of each vessel. Bilateral testing is considered an integral part of a complete examination. Limited examinations for reoccurring indications may be performed as noted.  Right Findings: +----------+------------+----------+---------+-----------+-------+ RIGHT     CompressiblePropertiesPhasicitySpontaneousSummary +----------+------------+----------+---------+-----------+-------+ Subclavian                         Yes       Yes            +----------+------------+----------+---------+-----------+-------+  Left Findings: +----------+------------+----------+---------+-----------+-------+ LEFT      CompressiblePropertiesPhasicitySpontaneousSummary +----------+------------+----------+---------+-----------+-------+ IJV           Full                 Yes       Yes            +----------+------------+----------+---------+-----------+-------+ Subclavian                         Yes       Yes            +----------+------------+----------+---------+-----------+-------+ Axillary      Full                 Yes       Yes            +----------+------------+----------+---------+-----------+-------+ Brachial      Full                 Yes       Yes            +----------+------------+----------+---------+-----------+-------+ Radial        Full                                          +----------+------------+----------+---------+-----------+-------+ Ulnar         Full                                          +----------+------------+----------+---------+-----------+-------+ Cephalic      Full                                          +----------+------------+----------+---------+-----------+-------+ Basilic       Full                                          +----------+------------+----------+---------+-----------+-------+  Final Interpretation:  Right: No evidence of thrombosis in the subclavian.  Left: No evidence of deep vein thrombosis  in the upper extremity. No evidence of superficial vein thrombosis in the upper extremity. No evidence of thrombosis in the subclavian.  *See table(s) above for measurements and observations.  Diagnosing physician: Harold Barban MD Electronically signed by Harold Barban MD on 08/22/2018 at 1:49:36 PM.    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 Multifocal tumor in the left breast with biopsy-proven left axillary metastases.   Noted to have extensive bone metastases and pulmonary metastases. Patient was noted to have calvarial metastases but no overt parenchymal metastasis She has been noted to have good clinical response and her tumor marker levels of progressively normalized. 09/14/17 CT Chest/ad/pelvis Results discussed in details - good response.  02/22/18 CT revealed  Newly apparent 2.1 by 2.0 cm rim enhancing lesion in the right hepatic lobe suspicious for a metastatic lesion. This was not apparent on prior exams although the prior exams were all noncontrast and thus the lesion may have been present but with reduced conspicuity. There is also a small enhancing lesion further posteriorly in the right hepatic lobe which is technically nonspecific and could be a small hemangioma or a small metastatic lesion. Stable distribution and appearance of prior sclerotic osseous metastatic disease without bony progression. 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   07/06/18 CT C/A/P which revealed New pattern of heterogeneous enhancement and enhancing ill-defined lesions in the central LEFT and RIGHT hepatic lobe at site prior enhancing lesion is highly concerning for progression of of infiltrative malignancy in the liver occupying a large portion of the RIGHT hepatic lobe and central LEFT hepatic lobe. 2. Small  amount of free fluid in the abdomen pelvis. 3. No metastatic adenopathy.4. No evidence pulmonary metastasis or lymphadenopathy. 5. Stable dense sclerotic skeletal metastasis   07/11/18 ECHO revealed LV EF of 55%-60%   07/18/18 Liver needle/core biopsy right lesion consi with Ki67 at 40%, ER/PR +ve   07/28/18 MRI Abdomen MRCP revealed Extensive hepatic metastatic disease, possibly progressive from recent abdominal CT. 2. No evidence of intrahepatic or extrahepatic biliary dilatation. Elevated liver function studies may be secondary to intrahepatic cholestasis. 3. Increased ascites without peritoneal nodularity or suspicious enhancement. 4. Grossly stable widespread osseous metastatic disease.   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 Much improved back pain.  # 3 Neutropenia Related to her Ribociclib - resolved. Patient is currently on Verzenio and has not developed any neutropenia. This is being monitored.  #4 Increased LFTs due to new liver metastases  #5 Cancer related pain - controlled - status post palliative RT to spine - no currently needing any pain medications.  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 No other new medication. Now resolved. Monitoring on increasing doses of Verzenio. No issues with recurrent rash on Verzenio 162m po BID  #7 Brain metastasis  07/20/18 CT Head revealed 1.8 x 2.4 x 3.9 cm LEFT frontal cortical based mass with possible dural component highly concerning for metastatic disease, less likely abscess or cerebritis. Severe vasogenic edema with 5 mm LEFT to RIGHT subfalcine herniation. No ventricular entrapment. 2. 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 3. Multiple calvarial metastasis. 4. Cerebellar atrophy.    07/21/18 MRI Brain revealed  Irregular plaque-like mass, likely metastasis, measuring up to 5.1 cm, appears to originate from the left anterolateral frontal dura with invasion of  the underlying frontal lobe of the brain. Mass effect from brain edema and the lesion partially effaces the frontal horn of left lateral ventricle and results in 7 mm left-to-right midline shift of anterior septum pellucidum. 2. Diffuse dural thickening over  the left cerebral convexity may represent edema associated with the tumor or invasive neoplasm. 3. 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   07/28/18 MRI Brain revealed Extensive nodular dural enhancement left frontal lobe with associated enhancing mass growing in the left frontal lobe is unchanged in size and compatible with metastatic disease. This is associated with metastatic disease to the left frontal bone. Extensive edema in the left frontal lobe has progressed in the interval. Blastic metastatic disease throughout the calvarium. Metastatic disease in the cervical spine. Enhancing mass along the floor of the posterior fossa on the right is stable and most consistent with meningioma. 6 mm enhancing mass in the prepontine cistern on the right also unchanged and compatible with meningioma.   PLAN:  -Continue follow up with Dr. Mickeal Skinner in Neuro-oncology and Dr. Tammi Klippel in [place] taking Claritin for Neulasta related pain -Continue with Delton See every 4 weeks   #8 Vitamin D deficiency Vit D levels 23.1 on 3/13. Back down to 19  #9 Left upper extremity swelling- [place] neg for DVT PLAN:  Tumors markers and LFTs progressively improving. -s/p SRS radiation therapy   -Will pursue 4-6 cycles, and considering following this with Taxol -Continue ergocalciferol at current dose. 50k units 3 times a week. Recommended she take this with fatty foods to improve absorption. -f/u with radiation oncology and neuro-oncology for mx of brain metastases. -Continue Keppra  -Pt is in communication with [facility] -Discussed pt labwork today, 09/20/18; anemia improved with HGB to 10.5, ANC at 9.1k in setting of  G-CSF support, PLT improved to 139k. Liver functions continue to improve with AST down to 46, ALT normalized to 42, and Alk Phos down to 435. Bilirubin continues to be normal.  -Fine to stop Lasix -Continue Potassium for one more week then hold -Recommend Calamine lotion for sore on posterior right lower extremity  -Will increase dose of Doxorubicin to 55m/m2 given continued improvement in liver functions, will continue 5047mm2 Cytoxan  -The pt has no prohibitive toxicities from continuing C4 of Doxorubicin and Cyclophosphamide at this time.   -Continue Neulasta injections -Continue Faslodex and Xgeva every 4 weeks -Recommend distilled water steam inhalation  -Discussed option of switching from Doxorubicin and Cyclophosphamide after C4  to Taxol, and the pt notes that she feels that she is tolerating treatment well enough to continue with AC. Will reassess this after C4.  -Will see the pt back at C5    -continue Q4weekly Faslodex and Xgeva on same days - plz schedule next 4 months of shots -plz schedule next chemotherapy (AC) in 3 weeks with neulasta, labs and MD visit   All of the patients questions were answered with apparent satisfaction. The patient knows to call the clinic with any problems, questions or concerns.   The total time spent in the appt was 30 minutes and more than 50% was on counseling and direct patient cares.    GaSullivan LoneD MS AAHIVMS SCDoctors Hospital Surgery Center LPTAmbulatory Center For Endoscopy LLCematology/Oncology Physician CoSugarland Rehab Hospital(Office):       [phone] cell):  [phone]):           [phone], ScBaldwin Jamaicaam acting as a scribe for Dr. KaIrene Limbo.I have reviewed the above documentation for accuracy and completeness, and I agree with the above. .GBrunetta GeneraD

## 2018-09-20 ENCOUNTER — Inpatient Hospital Stay: Payer: BC Managed Care – PPO

## 2018-09-20 ENCOUNTER — Inpatient Hospital Stay (HOSPITAL_BASED_OUTPATIENT_CLINIC_OR_DEPARTMENT_OTHER): Payer: BC Managed Care – PPO | Admitting: Hematology

## 2018-09-20 ENCOUNTER — Encounter: Payer: Self-pay | Admitting: Hematology

## 2018-09-20 VITALS — BP 101/77 | HR 79 | Temp 98.4°F | Resp 18 | Ht 65.0 in | Wt 163.0 lb

## 2018-09-20 DIAGNOSIS — C7931 Secondary malignant neoplasm of brain: Secondary | ICD-10-CM

## 2018-09-20 DIAGNOSIS — Z7189 Other specified counseling: Secondary | ICD-10-CM

## 2018-09-20 DIAGNOSIS — C50919 Malignant neoplasm of unspecified site of unspecified female breast: Secondary | ICD-10-CM

## 2018-09-20 DIAGNOSIS — E559 Vitamin D deficiency, unspecified: Secondary | ICD-10-CM

## 2018-09-20 DIAGNOSIS — Z5111 Encounter for antineoplastic chemotherapy: Secondary | ICD-10-CM | POA: Diagnosis not present

## 2018-09-20 DIAGNOSIS — C7951 Secondary malignant neoplasm of bone: Secondary | ICD-10-CM

## 2018-09-20 DIAGNOSIS — C7800 Secondary malignant neoplasm of unspecified lung: Secondary | ICD-10-CM

## 2018-09-20 DIAGNOSIS — C50812 Malignant neoplasm of overlapping sites of left female breast: Secondary | ICD-10-CM | POA: Diagnosis not present

## 2018-09-20 DIAGNOSIS — M7989 Other specified soft tissue disorders: Secondary | ICD-10-CM

## 2018-09-20 DIAGNOSIS — C787 Secondary malignant neoplasm of liver and intrahepatic bile duct: Secondary | ICD-10-CM | POA: Diagnosis not present

## 2018-09-20 DIAGNOSIS — G893 Neoplasm related pain (acute) (chronic): Secondary | ICD-10-CM

## 2018-09-20 DIAGNOSIS — Z95828 Presence of other vascular implants and grafts: Secondary | ICD-10-CM

## 2018-09-20 DIAGNOSIS — Z170 Estrogen receptor positive status [ER+]: Secondary | ICD-10-CM

## 2018-09-20 LAB — CBC WITH DIFFERENTIAL/PLATELET
ABS IMMATURE GRANULOCYTES: 0.15 10*3/uL — AB (ref 0.00–0.07)
Basophils Absolute: 0.1 10*3/uL (ref 0.0–0.1)
Basophils Relative: 1 %
Eosinophils Absolute: 0 10*3/uL (ref 0.0–0.5)
Eosinophils Relative: 0 %
HEMATOCRIT: 32.9 % — AB (ref 36.0–46.0)
HEMOGLOBIN: 10.5 g/dL — AB (ref 12.0–15.0)
Immature Granulocytes: 1 %
LYMPHS ABS: 1 10*3/uL (ref 0.7–4.0)
LYMPHS PCT: 9 %
MCH: 33.2 pg (ref 26.0–34.0)
MCHC: 31.9 g/dL (ref 30.0–36.0)
MCV: 104.1 fL — ABNORMAL HIGH (ref 80.0–100.0)
MONO ABS: 0.8 10*3/uL (ref 0.1–1.0)
MONOS PCT: 7 %
Neutro Abs: 9.1 10*3/uL — ABNORMAL HIGH (ref 1.7–7.7)
Neutrophils Relative %: 82 %
Platelets: 139 10*3/uL — ABNORMAL LOW (ref 150–400)
RBC: 3.16 MIL/uL — AB (ref 3.87–5.11)
RDW: 17.8 % — ABNORMAL HIGH (ref 11.5–15.5)
WBC: 11.1 10*3/uL — AB (ref 4.0–10.5)
nRBC: 0.9 % — ABNORMAL HIGH (ref 0.0–0.2)

## 2018-09-20 LAB — CMP (CANCER CENTER ONLY)
ALK PHOS: 435 U/L — AB (ref 38–126)
ALT: 42 U/L (ref 0–44)
ANION GAP: 9 (ref 5–15)
AST: 46 U/L — ABNORMAL HIGH (ref 15–41)
Albumin: 2.5 g/dL — ABNORMAL LOW (ref 3.5–5.0)
BILIRUBIN TOTAL: 0.9 mg/dL (ref 0.3–1.2)
BUN: 9 mg/dL (ref 8–23)
CALCIUM: 8.3 mg/dL — AB (ref 8.9–10.3)
CO2: 24 mmol/L (ref 22–32)
CREATININE: 0.75 mg/dL (ref 0.44–1.00)
Chloride: 108 mmol/L (ref 98–111)
GFR, Est AFR Am: >60 mL/min (ref >60)
GFR, Estimated: >60 mL/min (ref >60)
Glucose, Bld: 190 mg/dL — ABNORMAL HIGH (ref 70–99)
Potassium: 3.4 mmol/L — ABNORMAL LOW (ref 3.5–5.1)
SODIUM: 141 mmol/L (ref 135–145)
TOTAL PROTEIN: 5.4 g/dL — AB (ref 6.5–8.1)

## 2018-09-20 LAB — PHOSPHORUS: Phosphorus: 2.8 mg/dL (ref 2.5–4.6)

## 2018-09-20 MED ORDER — SODIUM CHLORIDE 0.9% FLUSH
10.0000 mL | Freq: Once | INTRAVENOUS | Status: AC
  Administered 2018-09-20: 10 mL
  Filled 2018-09-20: qty 10

## 2018-09-20 MED ORDER — SODIUM CHLORIDE 0.9 % IV SOLN
Freq: Once | INTRAVENOUS | Status: AC
  Administered 2018-09-20: 11:00:00 via INTRAVENOUS
  Filled 2018-09-20: qty 250

## 2018-09-20 MED ORDER — SODIUM CHLORIDE 0.9% FLUSH
10.0000 mL | INTRAVENOUS | Status: DC | PRN
  Administered 2018-09-20: 10 mL
  Filled 2018-09-20: qty 10

## 2018-09-20 MED ORDER — DOXORUBICIN HCL CHEMO IV INJECTION 2 MG/ML
50.0000 mg/m2 | Freq: Once | INTRAVENOUS | Status: AC
  Administered 2018-09-20: 90 mg via INTRAVENOUS
  Filled 2018-09-20: qty 45

## 2018-09-20 MED ORDER — PALONOSETRON HCL INJECTION 0.25 MG/5ML
INTRAVENOUS | Status: AC
  Filled 2018-09-20: qty 5

## 2018-09-20 MED ORDER — PALONOSETRON HCL INJECTION 0.25 MG/5ML
0.2500 mg | Freq: Once | INTRAVENOUS | Status: AC
  Administered 2018-09-20: 0.25 mg via INTRAVENOUS

## 2018-09-20 MED ORDER — SODIUM CHLORIDE 0.9 % IV SOLN
Freq: Once | INTRAVENOUS | Status: AC
  Administered 2018-09-20: 12:00:00 via INTRAVENOUS
  Filled 2018-09-20: qty 5

## 2018-09-20 MED ORDER — HEPARIN SOD (PORK) LOCK FLUSH 100 UNIT/ML IV SOLN
500.0000 [IU] | Freq: Once | INTRAVENOUS | Status: AC | PRN
  Administered 2018-09-20: 500 [IU]
  Filled 2018-09-20: qty 5

## 2018-09-20 MED ORDER — SODIUM CHLORIDE 0.9 % IV SOLN
500.0000 mg/m2 | Freq: Once | INTRAVENOUS | Status: AC
  Administered 2018-09-20: 900 mg via INTRAVENOUS
  Filled 2018-09-20: qty 45

## 2018-09-20 NOTE — Progress Notes (Signed)
Disability paper work given, form filled out by patient.  Placed in appropriate box for pick up 09/20/18

## 2018-09-20 NOTE — Patient Instructions (Signed)
[facility] Discharge Instructions for Patients Receiving Chemotherapy  Today you received the following chemotherapy agents: Doxorubicin (Adriamycin) and Cyclophosphamide (Cytoxan)  To help prevent nausea and vomiting after your treatment, we encourage you to take your nausea medication as directed.   If you develop nausea and vomiting that is not controlled by your nausea medication, call the clinic.   BELOW ARE SYMPTOMS THAT SHOULD BE REPORTED IMMEDIATELY:  *FEVER GREATER THAN 100.5 F  *CHILLS WITH OR WITHOUT FEVER  NAUSEA AND VOMITING THAT IS NOT CONTROLLED WITH YOUR NAUSEA MEDICATION  *UNUSUAL SHORTNESS OF BREATH  *UNUSUAL BRUISING OR BLEEDING  TENDERNESS IN MOUTH AND THROAT WITH OR WITHOUT PRESENCE OF ULCERS  *URINARY PROBLEMS  *BOWEL PROBLEMS  UNUSUAL RASH Items with * indicate a potential emergency and should be followed up as soon as possible.  Feel free to call the clinic should you have any questions or concerns. The clinic phone number is [phone].  Please show the CHEMO ALERT CARD at check-in to the Emergency Department and triage nurse.  

## 2018-09-21 ENCOUNTER — Telehealth: Payer: Self-pay

## 2018-09-21 LAB — CANCER ANTIGEN 15-3: CA 15-3: 155 U/mL — ABNORMAL HIGH (ref 0.0–25.0)

## 2018-09-21 LAB — CANCER ANTIGEN 27.29: CAN 27.29: 158.3 U/mL — AB (ref 0.0–38.6)

## 2018-09-21 NOTE — Telephone Encounter (Signed)
Called and left a detailed voice message. Will mail letter with calenclosed

## 2018-09-22 ENCOUNTER — Ambulatory Visit: Payer: Self-pay

## 2018-09-22 ENCOUNTER — Inpatient Hospital Stay: Payer: BC Managed Care – PPO | Attending: Hematology

## 2018-09-22 DIAGNOSIS — E559 Vitamin D deficiency, unspecified: Secondary | ICD-10-CM | POA: Insufficient documentation

## 2018-09-22 DIAGNOSIS — G893 Neoplasm related pain (acute) (chronic): Secondary | ICD-10-CM | POA: Diagnosis not present

## 2018-09-22 DIAGNOSIS — Z5111 Encounter for antineoplastic chemotherapy: Secondary | ICD-10-CM | POA: Diagnosis present

## 2018-09-22 DIAGNOSIS — Z170 Estrogen receptor positive status [ER+]: Secondary | ICD-10-CM | POA: Insufficient documentation

## 2018-09-22 DIAGNOSIS — C7800 Secondary malignant neoplasm of unspecified lung: Secondary | ICD-10-CM | POA: Diagnosis not present

## 2018-09-22 DIAGNOSIS — Z5189 Encounter for other specified aftercare: Secondary | ICD-10-CM | POA: Insufficient documentation

## 2018-09-22 DIAGNOSIS — C50812 Malignant neoplasm of overlapping sites of left female breast: Secondary | ICD-10-CM | POA: Diagnosis present

## 2018-09-22 DIAGNOSIS — Z7189 Other specified counseling: Secondary | ICD-10-CM

## 2018-09-22 DIAGNOSIS — Z95828 Presence of other vascular implants and grafts: Secondary | ICD-10-CM

## 2018-09-22 DIAGNOSIS — C787 Secondary malignant neoplasm of liver and intrahepatic bile duct: Secondary | ICD-10-CM | POA: Diagnosis not present

## 2018-09-22 DIAGNOSIS — C7951 Secondary malignant neoplasm of bone: Secondary | ICD-10-CM | POA: Insufficient documentation

## 2018-09-22 DIAGNOSIS — C7931 Secondary malignant neoplasm of brain: Secondary | ICD-10-CM | POA: Insufficient documentation

## 2018-09-22 DIAGNOSIS — C50919 Malignant neoplasm of unspecified site of unspecified female breast: Secondary | ICD-10-CM

## 2018-09-22 MED ORDER — DENOSUMAB 120 MG/1.7ML ~~LOC~~ SOLN
SUBCUTANEOUS | Status: AC
  Filled 2018-09-22: qty 1.7

## 2018-09-22 MED ORDER — PEGFILGRASTIM-CBQV 6 MG/0.6ML ~~LOC~~ SOSY
6.0000 mg | PREFILLED_SYRINGE | Freq: Once | SUBCUTANEOUS | Status: AC
  Administered 2018-09-22: 6 mg via SUBCUTANEOUS

## 2018-09-22 MED ORDER — FULVESTRANT 250 MG/5ML IM SOLN
INTRAMUSCULAR | Status: AC
  Filled 2018-09-22: qty 5

## 2018-09-22 MED ORDER — DENOSUMAB 120 MG/1.7ML ~~LOC~~ SOLN
120.0000 mg | Freq: Once | SUBCUTANEOUS | Status: AC
  Administered 2018-09-22: 120 mg via SUBCUTANEOUS

## 2018-09-22 MED ORDER — PEGFILGRASTIM INJECTION 6 MG/0.6ML ~~LOC~~
PREFILLED_SYRINGE | SUBCUTANEOUS | Status: AC
  Filled 2018-09-22: qty 0.6

## 2018-09-22 MED ORDER — FULVESTRANT 250 MG/5ML IM SOLN
500.0000 mg | Freq: Once | INTRAMUSCULAR | Status: AC
  Administered 2018-09-22: 500 mg via INTRAMUSCULAR

## 2018-09-22 NOTE — Patient Instructions (Signed)
Pegfilgrastim injection What is this medicine? PEGFILGRASTIM (PEG fil gra stim) is a long-acting granulocyte colony-stimulating factor that stimulates the growth of neutrophils, a type of white blood cell important in the body's fight against infection. It is used to reduce the incidence of fever and infection in patients with certain types of cancer who are receiving chemotherapy that affects the bone marrow, and to increase survival after being exposed to high doses of radiation. This medicine may be used for other purposes; ask your health care provider or pharmacist if you have questions. COMMON BRAND NAME(S): Neulasta What should I tell my health care provider before I take this medicine? They need to know if you have any of these conditions: -kidney disease -latex allergy -ongoing radiation therapy -sickle cell disease -skin reactions to acrylic adhesives (On-Body Injector only) -an unusual or allergic reaction to pegfilgrastim, filgrastim, other medicines, foods, dyes, or preservatives -pregnant or trying to get pregnant -breast-feeding How should I use this medicine? This medicine is for injection under the skin. If you get this medicine at home, you will be taught how to prepare and give the pre-filled syringe or how to use the On-body Injector. Refer to the patient Instructions for Use for detailed instructions. Use exactly as directed. Tell your healthcare provider immediately if you suspect that the On-body Injector may not have performed as intended or if you suspect the use of the On-body Injector resulted in a missed or partial dose. It is important that you put your used needles and syringes in a special sharps container. Do not put them in a trash can. If you do not have a sharps container, call your pharmacist or healthcare provider to get one. Talk to your pediatrician regarding the use of this medicine in children. While this drug may be prescribed for selected conditions,  precautions do apply. Overdosage: If you think you have taken too much of this medicine contact a poison control center or emergency room at once. NOTE: This medicine is only for you. Do not share this medicine with others. What if I miss a dose? It is important not to miss your dose. Call your doctor or health care professional if you miss your dose. If you miss a dose due to an On-body Injector failure or leakage, a new dose should be administered as soon as possible using a single prefilled syringe for manual use. What may interact with this medicine? Interactions have not been studied. Give your health care provider a list of all the medicines, herbs, non-prescription drugs, or dietary supplements you use. Also tell them if you smoke, drink alcohol, or use illegal drugs. Some items may interact with your medicine. This list may not describe all possible interactions. Give your health care provider a list of all the medicines, herbs, non-prescription drugs, or dietary supplements you use. Also tell them if you smoke, drink alcohol, or use illegal drugs. Some items may interact with your medicine. What should I watch for while using this medicine? You may need blood work done while you are taking this medicine. If you are going to need a MRI, CT scan, or other procedure, tell your doctor that you are using this medicine (On-Body Injector only). What side effects may I notice from receiving this medicine? Side effects that you should report to your doctor or health care professional as soon as possible: -allergic reactions like skin rash, itching or hives, swelling of the face, lips, or tongue -dizziness -fever -pain, redness, or irritation at site   where injected -pinpoint red spots on the skin -red or dark-brown urine -shortness of breath or breathing problems -stomach or side pain, or pain at the shoulder -swelling -tiredness -trouble passing urine or change in the amount of urine Side  effects that usually do not require medical attention (report to your doctor or health care professional if they continue or are bothersome): -bone pain -muscle pain This list may not describe all possible side effects. Call your doctor for medical advice about side effects. You may report side effects to FDA at 1-800-FDA-1088. Where should I keep my medicine? Keep out of the reach of children. Store pre-filled syringes in a refrigerator between 2 and 8 degrees C (36 and 46 degrees F). Do not freeze. Keep in carton to protect from light. Throw away this medicine if it is left out of the refrigerator for more than 48 hours. Throw away any unused medicine after the expiration date. NOTE: This sheet is a summary. It may not cover all possible information. If you have questions about this medicine, talk to your doctor, pharmacist, or health care provider.  2018 Elsevier/Gold Standard (2016-11-04 12:58:03)  Denosumab injection What is this medicine? DENOSUMAB (den oh sue mab) slows bone breakdown. Prolia is used to treat osteoporosis in women after menopause and in men. Delton See is used to treat a high calcium level due to cancer and to prevent bone fractures and other bone problems caused by multiple myeloma or cancer bone metastases. Delton See is also used to treat giant cell tumor of the bone. This medicine may be used for other purposes; ask your health care provider or pharmacist if you have questions. COMMON BRAND NAME(S): Prolia, XGEVA What should I tell my health care provider before I take this medicine? They need to know if you have any of these conditions: -dental disease -having surgery or tooth extraction -infection -kidney disease -low levels of calcium or Vitamin D in the blood -malnutrition -on hemodialysis -skin conditions or sensitivity -thyroid or parathyroid disease -an unusual reaction to denosumab, other medicines, foods, dyes, or preservatives -pregnant or trying to get  pregnant -breast-feeding How should I use this medicine? This medicine is for injection under the skin. It is given by a health care professional in a hospital or clinic setting. If you are getting Prolia, a special MedGuide will be given to you by the pharmacist with each prescription and refill. Be sure to read this information carefully each time. For Prolia, talk to your pediatrician regarding the use of this medicine in children. Special care may be needed. For Delton See, talk to your pediatrician regarding the use of this medicine in children. While this drug may be prescribed for children as young as 13 years for selected conditions, precautions do apply. Overdosage: If you think you have taken too much of this medicine contact a poison control center or emergency room at once. NOTE: This medicine is only for you. Do not share this medicine with others. What if I miss a dose? It is important not to miss your dose. Call your doctor or health care professional if you are unable to keep an appointment. What may interact with this medicine? Do not take this medicine with any of the following medications: -other medicines containing denosumab This medicine may also interact with the following medications: -medicines that lower your chance of fighting infection -steroid medicines like prednisone or cortisone This list may not describe all possible interactions. Give your health care provider a list of all the medicines,  herbs, non-prescription drugs, or dietary supplements you use. Also tell them if you smoke, drink alcohol, or use illegal drugs. Some items may interact with your medicine. What should I watch for while using this medicine? Visit your doctor or health care professional for regular checks on your progress. Your doctor or health care professional may order blood tests and other tests to see how you are doing. Call your doctor or health care professional for advice if you get a fever,  chills or sore throat, or other symptoms of a cold or flu. Do not treat yourself. This drug may decrease your body's ability to fight infection. Try to avoid being around people who are sick. You should make sure you get enough calcium and vitamin D while you are taking this medicine, unless your doctor tells you not to. Discuss the foods you eat and the vitamins you take with your health care professional. See your dentist regularly. Brush and floss your teeth as directed. Before you have any dental work done, tell your dentist you are receiving this medicine. Do not become pregnant while taking this medicine or for 5 months after stopping it. Talk with your doctor or health care professional about your birth control options while taking this medicine. Women should inform their doctor if they wish to become pregnant or think they might be pregnant. There is a potential for serious side effects to an unborn child. Talk to your health care professional or pharmacist for more information. What side effects may I notice from receiving this medicine? Side effects that you should report to your doctor or health care professional as soon as possible: -allergic reactions like skin rash, itching or hives, swelling of the face, lips, or tongue -bone pain -breathing problems -dizziness -jaw pain, especially after dental work -redness, blistering, peeling of the skin -signs and symptoms of infection like fever or chills; cough; sore throat; pain or trouble passing urine -signs of low calcium like fast heartbeat, muscle cramps or muscle pain; pain, tingling, numbness in the hands or feet; seizures -unusual bleeding or bruising -unusually weak or tired Side effects that usually do not require medical attention (report to your doctor or health care professional if they continue or are bothersome): -constipation -diarrhea -headache -joint pain -loss of appetite -muscle pain -runny nose -tiredness -upset  stomach This list may not describe all possible side effects. Call your doctor for medical advice about side effects. You may report side effects to FDA at 1-800-FDA-1088. Where should I keep my medicine? This medicine is only given in a clinic, doctor's office, or other health care setting and will not be stored at home. NOTE: This sheet is a summary. It may not cover all possible information. If you have questions about this medicine, talk to your doctor, pharmacist, or health care provider.  2018 Elsevier/Gold Standard (2016-11-30 19:17:21)  Fulvestrant injection What is this medicine? FULVESTRANT (ful VES trant) blocks the effects of estrogen. It is used to treat breast cancer. This medicine may be used for other purposes; ask your health care provider or pharmacist if you have questions. COMMON BRAND NAME(S): FASLODEX What should I tell my health care provider before I take this medicine? They need to know if you have any of these conditions: -bleeding problems -liver disease -low levels of platelets in the blood -an unusual or allergic reaction to fulvestrant, other medicines, foods, dyes, or preservatives -pregnant or trying to get pregnant -breast-feeding How should I use this medicine? This medicine is for  injection into a muscle. It is usually given by a health care professional in a hospital or clinic setting. Talk to your pediatrician regarding the use of this medicine in children. Special care may be needed. Overdosage: If you think you have taken too much of this medicine contact a poison control center or emergency room at once. NOTE: This medicine is only for you. Do not share this medicine with others. What if I miss a dose? It is important not to miss your dose. Call your doctor or health care professional if you are unable to keep an appointment. What may interact with this medicine? -medicines that treat or prevent blood clots like warfarin, enoxaparin, and  dalteparin This list may not describe all possible interactions. Give your health care provider a list of all the medicines, herbs, non-prescription drugs, or dietary supplements you use. Also tell them if you smoke, drink alcohol, or use illegal drugs. Some items may interact with your medicine. What should I watch for while using this medicine? Your condition will be monitored carefully while you are receiving this medicine. You will need important blood work done while you are taking this medicine. Do not become pregnant while taking this medicine or for at least 1 year after stopping it. Women of child-bearing potential will need to have a negative pregnancy test before starting this medicine. Women should inform their doctor if they wish to become pregnant or think they might be pregnant. There is a potential for serious side effects to an unborn child. Men should inform their doctors if they wish to father a child. This medicine may lower sperm counts. Talk to your health care professional or pharmacist for more information. Do not breast-feed an infant while taking this medicine or for 1 year after the last dose. What side effects may I notice from receiving this medicine? Side effects that you should report to your doctor or health care professional as soon as possible: -allergic reactions like skin rash, itching or hives, swelling of the face, lips, or tongue -feeling faint or lightheaded, falls -pain, tingling, numbness, or weakness in the legs -signs and symptoms of infection like fever or chills; cough; flu-like symptoms; sore throat -vaginal bleeding Side effects that usually do not require medical attention (report to your doctor or health care professional if they continue or are bothersome): -aches, pains -constipation -diarrhea -headache -hot flashes -nausea, vomiting -pain at site where injected -stomach pain This list may not describe all possible side effects. Call your  doctor for medical advice about side effects. You may report side effects to FDA at 1-800-FDA-1088. Where should I keep my medicine? This drug is given in a hospital or clinic and will not be stored at home. NOTE: This sheet is a summary. It may not cover all possible information. If you have questions about this medicine, talk to your doctor, pharmacist, or health care provider.  2018 Elsevier/Gold Standard (2015-06-06 11:03:55)

## 2018-09-25 NOTE — Progress Notes (Signed)
Disability successfully faxed to [facility] attn: Shane Crutch, at [phone]. Mailed copy to patient address on file.

## 2018-09-26 ENCOUNTER — Ambulatory Visit
Admission: RE | Admit: 2018-09-26 | Discharge: 2018-09-26 | Disposition: A | Discharge status: Home / Self Care | Payer: BC Managed Care – PPO | Source: Ambulatory Visit | Attending: Urology | Admitting: Urology

## 2018-09-26 ENCOUNTER — Other Ambulatory Visit: Payer: Self-pay

## 2018-09-26 VITALS — BP 107/81 | HR 83 | Temp 99.4°F | Resp 20 | Ht 65.0 in | Wt 163.2 lb

## 2018-09-26 DIAGNOSIS — C50919 Malignant neoplasm of unspecified site of unspecified female breast: Secondary | ICD-10-CM

## 2018-09-26 DIAGNOSIS — C7931 Secondary malignant neoplasm of brain: Principal | ICD-10-CM

## 2018-09-26 NOTE — Progress Notes (Signed)
Radiation Oncology         [phone] ________________________________  Name: Stacey Cox MRN: [id number]  Date: 09/26/2018  DOB: Nov 18, 1956  Post Treatment Note  CC: Patient, No Pcp Per  [name], MD  Diagnosis:   62 y.o. female with a symptomatic left frontal brain metastasis    Interval Since Last Radiation:  4 weeks  08/16/2018, 08/18/2018, 08/21/2018, 08/23/2018, 08/25/2018: Brain PTV1: Left anterior frontal lobe 26m // 25 Gy in 5 fractions, Max dose = 129.1%  02/07/2017-02/18/2017:  The L-Spine was treated to 30 Gy in 10 fractions at 3 Gy per fraction.  Narrative:  The patient returns today for routine follow-up s/p completion of recent fractionated SRS treatement to the left frontal lobe lesion.  She tolerated radiation treatment well.   There were no signs of acute toxicity after treatment.                              In summary, she is a 62y.o. 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 CT and MRI imaging at the time of admission/workup showed an ill-defined sclerotic lesion at L2 without pathologic fracture, mildly sclerotic as well as a mildly expansile lesion at LEFT L3 vertebral body with suspected pathologic fracture. She had an MRI of brain on 01/22/17 which revealed an 11 mm extra-axial mass in the posterior fossa favoring a small meningioma over metastasis and a 6 mm nodule was also discovered in the prepontine cistern, possibly an additional small meningioma. A large occipital lobe calvarial lesion was noted but no parenchymal disease.    She established care with Dr. KIrene Limboon 02/01/17 and received Xgeva injections every 4 weeks in addition to [place] She received palliative radiation to her lumbar spine and sacrum from 02/07/2017 to 02/18/2017 and had dramatic  improvement of her low back pain.   She initially showed good response to treatment until April 2019 CT showed new enhancing lesion in the right hepatic lobe. CT C/A/P on 07/06/2018 showed no evidence of pulmonary metastasis or lymphadenopathy and stable dense sclerotic skeletal metastasis. There was a new pattern of heterogeneous enhancement and enhancing ill-defined lesions in the central left and right hepatic lobes. Prior enhancing lesion was highly concerning for progression of infiltrative malignancy in the liver occupying a large portion of the right hepatic lobe and central left hepatic lobe. No metastatic adenopathy in the abdomen/pelvis. Stable skeletal sclerotic metastasis.  Her systemic therapy was changed to doxorubicin and cyclophosphamide every 3 weeks, started on 07/13/2018.  Verzenio and Femara were discontinued.  Biopsy of the right lesion of the liver on 07/18/2018 revealed metastatic carcinoma, consistent with primary ER/PR positive breast carcinoma.  She presented to the emergency department on 07/20/2018 with two witnessed seizure episodes. She denied any neurologic symptoms prior to the seizures, and after a day in the hospital was back to her baseline. Brain MRI at the time of admission demonstrated a plaque-like irregular enhancing mass with broad base to the dura measuring 5.1 x 2.2 x 2.4 cm, likely originating from the dura with invasion of the underlying left frontal lobe with diffuse, smooth dural thickening over the left cerebral convexity which may represent associated edema and/or invasive neoplasm. Mass effect from the lesion and brain edema partially effaces the frontal horn of left  lateral ventricle resulting in 7 mm of left-to-right midline shift of the anterior septum pellucidum but without herniation. She was started on Decadron 4 mg daily. Repeat MRI of the brain on 07/28/2018 showed extensive nodular dural enhancement in the left frontal lobe with associated enhancing  mass growing in the left frontal lobe, unchanged in size and compatible with metastatic disease associated with metastatic disease to the left frontal bone. Extensive edema in the left frontal lobe had progressed. There was blastic metastatic disease throughout the calvarium, as well as metastatic disease in the cervical spine. Enhancing mass along the floor of the posterior fossa on the right appeared stable and most consistent with meningioma. 6 mm enhancing mass in the prepontine cistern on the right was also unchanged and compatible with meningioma.  She elected to proceed with fractionated SRS treatment to the left frontal mass which was completed on 08/25/18 and tolerated very well.  She continues on systemic therapy with Doxorubicin and Cyclophosphamide under the care and direction of Dr. Irene Limbo- recently completed cycle 4 on 09/20/18 with plans to continue q 3 weeks as tolerated.  She is also receiving Faslodex and Xgeva monthly.   On review of systems, the patient states that she is doing very well overall.  She denies headaches, dizziness, imbalance, difficulty with speech or further seizure activity.  She has mild residual fatigue but feels that this is gradually improving.  She has continued taking Keppra 750 mg p.o. twice daily and dexamethasone 1 mg daily.  ALLERGIES:  is allergic to thorazine [chlorpromazine] and ribociclib.  Meds: Current Outpatient Medications  Medication Sig Dispense Refill  . aspirin EC 81 MG tablet Take 1 tablet (81 mg total) by mouth daily.    [name] dexamethasone (DECADRON) 2 MG tablet Take 1 tablet (2 mg total) by mouth daily. 30 tablet 2  . escitalopram (LEXAPRO) 20 MG tablet TAKE 1 TABLET(20 MG) BY MOUTH DAILY 30 tablet 0  . feeding supplement, ENSURE ENLIVE, (ENSURE ENLIVE) LIQD Take 237 mLs by mouth 2 (two) times daily between meals. (Patient taking differently: Take 237-474 mLs by mouth 2 (two) times daily between meals. ) 237 mL 12  . furosemide (LASIX) 20 MG  tablet Take 1 tablet (20 mg total) by mouth daily. 5 tablet 0  . levETIRAcetam (KEPPRA) 750 MG tablet Take 1 tablet (750 mg total) by mouth 2 (two) times daily. 60 tablet 3  . lidocaine-prilocaine (EMLA) cream Apply to affected area once 30 g 3  . loperamide (IMODIUM) 1 MG/5ML solution Take 2 mg by mouth as needed for diarrhea or loose stools.     . pantoprazole (PROTONIX) 40 MG tablet TAKE 1 TABLET(40 MG) BY MOUTH DAILY 30 tablet 1  . potassium chloride (K-DUR) 10 MEQ tablet Take 1 tablet (10 mEq total) by mouth daily. 5 tablet 0  . potassium chloride SA (K-DUR,KLOR-CON) 20 MEQ tablet TAKE 1 TABLET(20 MEQ) BY MOUTH TWICE DAILY 30 tablet 0  . Vitamin D, Ergocalciferol, (DRISDOL) 50000 units CAPS capsule TAKE 1 CAPSULE BY MOUTH 3 TIMES WEEKLY (Patient taking differently: Take 50,000 Units by mouth. TAKE 1 CAPSULE BY MOUTH 3 TIMES WEEKLY) 24 capsule 0   No current facility-administered medications for this encounter.     Physical Findings:  height is '5\' 5"'$  (1.651 m) and weight is 163 lb 3.2 oz (74 kg). Her oral temperature is 99.4 F (37.4 C). Her blood pressure is 107/81 and her pulse is 83. Her respiration is 20 and oxygen saturation is 100%.  Pain  Assessment Pain Score: 0-No pain/10 In general this is a well appearing African-American female in no acute distress.  She's alert and oriented x4 and appropriate throughout the examination. Cardiopulmonary assessment is negative for acute distress and she exhibits normal effort. She appears grossly neurologically intact.  Her speech is fluent, appropriate and well organized.  EOMs are intact bilaterally and PERRLA.   Lab Findings: Lab Results  Component Value Date   WBC 11.1 (H) 09/20/2018   HGB 10.5 (L) 09/20/2018   HCT 32.9 (L) 09/20/2018   MCV 104.1 (H) 09/20/2018   PLT 139 (L) 09/20/2018     Radiographic Findings: No results found.  Impression/Plan: 49. 62 y.o. female with a symptomatic left frontal brain metastasis. She appears to  have recovered well from the effects of her recent radiotherapy.  She is currently without complaints.  She was provided written instructions for steroid taper over the next 2 weeks.  We will plan to obtain a follow-up MRI brain to assess her treatment response in approximately 2 months and we will see her back in the office thereafter to review results and recommendations from the multidisciplinary brain conference at that time.  Pending those results are stable, we will continue with serial MRI brain scans every 3 months to monitor for any evidence of disease progression or recurrence.  She has a scheduled follow up with Dr. Mickeal Skinner on 11/04/18 for continued monitoring of neurologic status given recent history of seizure activity prior to treatment of the frontal lesion.  She will also continue in routine follow up with Dr. Irene Limbo for continued systemic disease management.  She knows to call at anytime in the interim with any questions or concerns related to her previous radiotherapy.    Nicholos Johns, PA-C

## 2018-09-29 ENCOUNTER — Other Ambulatory Visit: Payer: Self-pay | Admitting: Hematology

## 2018-10-02 ENCOUNTER — Other Ambulatory Visit: Payer: Self-pay | Admitting: *Deleted

## 2018-10-02 ENCOUNTER — Telehealth: Payer: Self-pay | Admitting: *Deleted

## 2018-10-02 ENCOUNTER — Other Ambulatory Visit: Payer: Self-pay | Admitting: Hematology

## 2018-10-02 MED ORDER — NYSTATIN 100000 UNIT/ML MT SUSP: 5.0000 mL | Freq: Four times a day (QID) | OROMUCOSAL | 0 refills | Status: DC

## 2018-10-02 NOTE — Telephone Encounter (Signed)
Patient called to state needs refill of magic mouthwash - she is out and mouth is starting to have sores. Per V. Tanner, PA, okay to provide refill. Notified patient.

## 2018-10-02 NOTE — Telephone Encounter (Signed)
Patient called - states has mouth sores beginning again and is out of Magic Mouthwash. Can she get a refill?  Per chart, last ordered 04/01/17.

## 2018-10-03 ENCOUNTER — Other Ambulatory Visit: Payer: Self-pay | Admitting: Internal Medicine

## 2018-10-03 MED ORDER — LEVETIRACETAM 750 MG PO TABS: 750.0000 mg | ORAL_TABLET | Freq: Two times a day (BID) | ORAL | 3 refills | Status: DC

## 2018-10-09 ENCOUNTER — Other Ambulatory Visit: Payer: Self-pay | Admitting: *Deleted

## 2018-10-09 ENCOUNTER — Telehealth: Payer: Self-pay | Admitting: *Deleted

## 2018-10-09 DIAGNOSIS — C7951 Secondary malignant neoplasm of bone: Secondary | ICD-10-CM

## 2018-10-09 NOTE — Telephone Encounter (Signed)
"  I need to reschedule Radiology scan ordered by Dr.Vaslow.  I need scans December 2 through December 12 because I will be out of town for a family emergency." Provided [facility] number.  Message left for [facility] with call information.

## 2018-10-09 NOTE — Progress Notes (Signed)
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  INTERVAL HISTORY   Stacey Cox is here for follow-up of her metastatic hormone positive HER-2 negative breast cancer. The patient's last visit with [place] was on 09/20/18. She is accompanied today by two of her friends. The pt reports that she is doing well overall.   The pt reports that she has not had any concerns with the chemotherapy in the interim.   She has however developed blisters, denies pain, endorses mild itching, on the posterior of both of her ankles. She notes that these blisters developed about 10 days ago. She is now applying Aquaphor and clobetasol cream as recommended by her dermatologist, noting some improvement. She notes that the blisters haven't burst, but dry out after topical steroid application. She denies anything rubbing on her ankles overtly, or concern for physical irritation. She denies any leg swelling. She denies any SOB, progression of these rashes/blisters, or any other areas of involvement. The pt is not taking PO daily steroids.   The pt also notes that her left arm swelling has resolved some as well. She has continued eating well and has gained weight which is desired. She notes that her activity levels have been slowly improving as well.   The pt has continued on Keppra without complaint. She does note that her hands have some trembling with action and movement, but not at rest. She notices this when moving a spoon to her mouth or when performing fine motor skills. She notices that this is somewhat worse in the mornings.   The pt also notes that her anxiety is generally improved as well.   Lab results today (10/09/18) of CBC w/diff, CMP,  and Reticulocytes is as follows: all values are WNL except for WBC at 10.8k, RBC at 3.12, HGB at 10.2, HCT at 32.8, MCV at 105.1, RDW at 17.9, nRBC at 2.1%, ANC at 8.8k, Abs immature granulocytes at 0.24k, Potassium at 3.3, Glucose at 237, Calcium at 8.5, Total Protein at 5.7, Albumin at 2.7, AST at 48, ALT at 48, Alk Phos at 289. 10/10/18 Magnesium is at 2.0 10/10/18 Phosphorous is pending   On review of systems, pt reports blisters on posterior ankles, weight gain, eating well, improved energy levels, occasional hand trembling, and denies leg swelling, abdominal pains, painful rash, abdominal tenderness, nausea, vomiting, concerns for infections, and any other symptoms.    MEDICAL HISTORY:  Past Medical History:  Diagnosis Date  . Cancer [facility])    Metastatic Breast Cancer    SURGICAL HISTORY: Past Surgical History:  Procedure Laterality Date  . CHOLECYSTECTOMY    . IR IMAGING GUIDED PORT INSERTION  07/14/2018  . TONSILLECTOMY      SOCIAL HISTORY: Social History   Socioeconomic History  . Marital status: Single    Spouse name: Not on file  . Number of children: Not on file  . Years of education: Not on file  . Highest education level: Not on file  Occupational History  . Not on file  Social Needs  . Financial resource strain: Not on file  . Food insecurity:    Worry: Not on file    Inability: Not on file  . Transportation needs:  Medical: Not on file    Non-medical: Not on file  Tobacco Use  . Smoking status: Current Every Day Smoker    Packs/day: 0.15    Types: Cigarettes    Last attempt to quit: 01/21/2017    Years since quitting: 1.7  . Smokeless tobacco: Never Used  Substance and Sexual Activity  . Alcohol use: No    Alcohol/week: 0.0 standard drinks  . Drug use: No  . Sexual activity: Never  Lifestyle  . Physical activity:    Days per week: Not on file    Minutes per session: Not on file  . Stress: Not on file  Relationships  . Social connections:     Talks on phone: Not on file    Gets together: Not on file    Attends religious service: Not on file    Active member of club or organization: Not on file    Attends meetings of clubs or organizations: Not on file    Relationship status: Not on file  . Intimate partner violence:    Fear of current or ex partner: No    Emotionally abused: No    Physically abused: No    Forced sexual activity: No  Other Topics Concern  . Not on file  Social History Narrative   Lives alone. 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 Has phobia of the medical system.  Cigarette smoker 1/2 PPD for about 40 yrs Social alcohol use No drugs Professor at [employer] no children  FAMILY HISTORY: Family History  Problem Relation Age of Onset  . Cancer Mother        breast  . Cancer Sister        breast  Mother with h/o breast cancer in her 42's Sister with breast cancer in her 14ys and later was diagnosed with multiple myeloma. (Patient is not aware of any specific breast cancer mutations present)  ALLERGIES:  is allergic to thorazine [chlorpromazine] and ribociclib.  MEDICATIONS:  Current Outpatient Medications  Medication Sig Dispense Refill  . Emollient (AQUAPHOR EX) Apply topically. 2 x day to lower legs    . aspirin EC 81 MG tablet Take 1 tablet (81 mg total) by mouth daily.    [name] dexamethasone (DECADRON) 2 MG tablet Take 1 tablet (2 mg total) by mouth daily. (Patient not taking: Reported on 10/10/2018) 30 tablet 2  . escitalopram (LEXAPRO) 20 MG tablet TAKE 1 TABLET(20 MG) BY MOUTH DAILY 30 tablet 0  . feeding supplement, ENSURE ENLIVE, (ENSURE ENLIVE) LIQD Take 237 mLs by mouth 2 (two) times daily between meals. (Patient taking differently: Take 237-474 mLs by mouth 2 (two) times daily between meals. ) 237 mL 12  . furosemide (LASIX) 20 MG tablet Take 1 tablet (20 mg total) by mouth daily. 5 tablet 0  . levETIRAcetam (KEPPRA) 750 MG tablet Take 1 tablet (750 mg total) by mouth 2 (two) times daily. 60  tablet 3  . lidocaine-prilocaine (EMLA) cream Apply to affected area once 30 g 3  . loperamide (IMODIUM) 1 MG/5ML solution Take 2 mg by mouth as needed for diarrhea or loose stools.     . nystatin (MYCOSTATIN) 100000 UNIT/ML suspension Take 5 mLs (500,000 Units total) by mouth 4 (four) times daily. 473 mL 0  . pantoprazole (PROTONIX) 40 MG tablet TAKE 1 TABLET(40 MG) BY MOUTH DAILY 30 tablet 1  . potassium chloride (K-DUR) 10 MEQ tablet Take 1 tablet (10 mEq total) by mouth daily. (Patient not taking: Reported on 10/10/2018) 5  tablet 0  . potassium chloride SA (K-DUR,KLOR-CON) 20 MEQ tablet TAKE 1 TABLET(20 MEQ) BY MOUTH TWICE DAILY (Patient not taking: Reported on 10/10/2018) 30 tablet 0  . Vitamin D, Ergocalciferol, (DRISDOL) 50000 units CAPS capsule TAKE 1 CAPSULE BY MOUTH 3 TIMES WEEKLY (Patient taking differently: Take 50,000 Units by mouth. TAKE 1 CAPSULE BY MOUTH 3 TIMES WEEKLY) 24 capsule 0   No current facility-administered medications for this visit.     REVIEW OF SYSTEMS:    A 10+ POINT REVIEW OF SYSTEMS WAS OBTAINED including neurology, dermatology, psychiatry, cardiac, respiratory, lymph, extremities, GI, GU, Musculoskeletal, constitutional, breasts, reproductive, HEENT.  All pertinent positives are noted in the HPI.  All others are negative.   PHYSICAL EXAMINATION:  ECOG PERFORMANCE STATUS: 1 - Symptomatic but completely ambulatory  Vitals:   10/10/18 0829  BP: 107/70  Pulse: 90  Resp: 16  Temp: 97.9 F (36.6 C)  SpO2: 100%   Filed Weights   10/10/18 0829  Weight: 167 lb 9.6 oz (76 kg)   .Body mass index is 27.89 kg/m.  GENERAL:alert, in no acute distress and comfortable SKIN: bilateral Grade 1 posterior ankle rash, no significant lesions EYES: conjunctiva are pink and non-injected, sclera anicteric OROPHARYNX: MMM, no exudates, no oropharyngeal erythema or ulceration NECK: supple, no JVD LYMPH:  no palpable lymphadenopathy in the cervical, axillary or inguinal  regions LUNGS: clear to auscultation b/l with normal respiratory effort HEART: regular rate & rhythm ABDOMEN:  normoactive bowel sounds, no tenderness, not distended. No palpable hepatosplenomegaly.  Extremity: 1+ pedal edema PSYCH: alert & oriented x 3 with fluent speech NEURO: no focal motor/sensory deficits   LABORATORY DATA:  I have reviewed the data as listed  . CBC Latest Ref Rng & Units 10/10/2018 09/20/2018 09/11/2018  WBC 4.0 - 10.5 K/uL 10.8(H) 11.1(H) 14.1(H)  Hemoglobin 12.0 - 15.0 g/dL 10.2(L) 10.5(L) 9.7(L)  Hematocrit 36.0 - 46.0 % 32.8(L) 32.9(L) 31.0(L)  Platelets 150 - 400 K/uL 182 139(L) 62(L)   CBC    Component Value Date/Time   WBC 10.8 (H) 10/10/2018 0823   WBC 11.1 (H) 09/20/2018 0838   RBC 3.12 (L) 10/10/2018 0823   HGB 10.2 (L) 10/10/2018 0823   HGB 13.9 11/24/2017 1340   HCT 32.8 (L) 10/10/2018 0823   HCT 42.0 11/24/2017 1340   PLT 182 10/10/2018 0823   PLT 212 11/24/2017 1340   MCV 105.1 (H) 10/10/2018 0823   MCV 97.0 11/24/2017 1340   MCH 32.7 10/10/2018 0823   MCHC 31.1 10/10/2018 0823   RDW 17.9 (H) 10/10/2018 0823   RDW 14.1 11/24/2017 1340   LYMPHSABS 0.7 10/10/2018 0823   LYMPHSABS 1.4 11/24/2017 1340   MONOABS 1.0 10/10/2018 0823   MONOABS 0.3 11/24/2017 1340   EOSABS 0.0 10/10/2018 0823   EOSABS 0.1 11/24/2017 1340   BASOSABS 0.1 10/10/2018 0823   BASOSABS 0.1 11/24/2017 1340    . CMP Latest Ref Rng & Units 10/10/2018 09/20/2018 09/11/2018  Glucose 70 - 99 mg/dL 237(H) 190(H) 130(H)  BUN 8 - 23 mg/dL _0 Creatinine 0.44 - 1.00 mg/dL 0.78 0.75 0.75  Sodium 135 - 145 mmol/L 143 141 141  Potassium 3.5 - 5.1 mmol/L 3.3(L) 3.4(L) 4.1  Chloride 98 - 111 mmol/L 106 108 108  CO2 22 - 32 mmol/L _1 Calcium 8.9 - 10.3 mg/dL 8.5(L) 8.3(L) 8.4(L)  Total Protein 6.5 - 8.1 g/dL 5.7(L) 5.4(L) 5.6(L)  Total Bilirubin 0.3 - 1.2 mg/dL 0.6 0.9 1.0  Alkaline  Phos 38 - 126 U/L 289(H) 435(H) 579(H)  AST 15 - 41 U/L 48(H) 46(H) 65(H)    ALT 0 - 44 U/L 48(H) 42 53(H)       07/18/18 Pathology:          RADIOGRAPHIC STUDIES: I have personally reviewed the radiological images as listed and agreed with the findings in the report. No results found.  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 Multifocal tumor in the left breast with biopsy-proven left axillary metastases.   Noted to have extensive bone metastases and pulmonary metastases. Patient was noted to have calvarial metastases but no overt parenchymal metastasis 09/14/17 CT Chest/ad/pelvis Results discussed in details - good response.  02/22/18 CT revealed  Newly apparent 2.1 by 2.0 cm rim enhancing lesion in the right hepatic lobe suspicious for a metastatic lesion. This was not apparent on prior exams although the prior exams were all noncontrast and thus the lesion may have been present but with reduced conspicuity. There is also a small enhancing lesion further posteriorly in the right hepatic lobe which is technically nonspecific and could be a small hemangioma or a small metastatic lesion. Stable distribution and appearance of prior sclerotic osseous metastatic disease without bony progression. 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   07/06/18 CT C/A/P which revealed New pattern of heterogeneous enhancement and enhancing ill-defined lesions in the central LEFT and RIGHT hepatic lobe at site prior enhancing lesion is highly concerning for progression of of infiltrative malignancy in the liver occupying a large portion of the RIGHT hepatic lobe and central LEFT hepatic lobe. 2. Small amount of free fluid in the abdomen pelvis. 3. No metastatic adenopathy.4. No evidence pulmonary metastasis or lymphadenopathy. 5. Stable dense sclerotic skeletal metastasis   07/11/18 ECHO  revealed LV EF of 55%-60%   07/18/18 Liver needle/core biopsy right lesion consi with Ki67 at 40%, ER/PR +ve   07/28/18 MRI Abdomen MRCP revealed Extensive hepatic metastatic disease, possibly progressive from recent abdominal CT. 2. No evidence of intrahepatic or extrahepatic biliary dilatation. Elevated liver function studies may be secondary to intrahepatic cholestasis. 3. Increased ascites without peritoneal nodularity or suspicious enhancement. 4. Grossly stable widespread osseous metastatic disease.   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 Much improved back pain.  # 3 Neutropenia Related to her Ribociclib - resolved. Patient is currently on Verzenio and has not developed any neutropenia. This is being monitored.  #4 Increased LFTs due to new liver metastases  #5 Cancer related pain - controlled - status post palliative RT to spine - no currently needing any pain medications.  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 No other new medication. Now resolved. Monitoring on increasing doses of Verzenio. No issues with recurrent rash on Verzenio 121m po BID  #7 Brain metastasis  07/20/18 CT Head revealed 1.8 x 2.4 x 3.9 cm LEFT frontal cortical based mass with possible dural component highly concerning for metastatic disease, less likely abscess or cerebritis. Severe vasogenic edema with 5 mm LEFT to RIGHT subfalcine herniation. No ventricular entrapment. 2. 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 3. Multiple calvarial metastasis. 4. Cerebellar atrophy.    07/21/18 MRI Brain revealed  Irregular plaque-like mass, likely metastasis, measuring up to  5.1 cm, appears to originate from the left anterolateral frontal dura with invasion of the underlying frontal lobe of the brain. Mass effect from brain edema and the lesion partially effaces the frontal horn of left lateral ventricle and results in 7 mm left-to-right midline  shift of anterior septum pellucidum. 2. Diffuse dural thickening over the left cerebral convexity may represent edema associated with the tumor or invasive neoplasm. 3. 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   07/28/18 MRI Brain revealed Extensive nodular dural enhancement left frontal lobe with associated enhancing mass growing in the left frontal lobe is unchanged in size and compatible with metastatic disease. This is associated with metastatic disease to the left frontal bone. Extensive edema in the left frontal lobe has progressed in the interval. Blastic metastatic disease throughout the calvarium. Metastatic disease in the cervical spine. Enhancing mass along the floor of the posterior fossa on the right is stable and most consistent with meningioma. 6 mm enhancing mass in the prepontine cistern on the right also unchanged and compatible with meningioma.   PLAN:  -Continue follow up with Dr. Mickeal Skinner in Neuro-oncology and Dr. Tammi Klippel in [place] taking Claritin for Neulasta related pain -Continue with Delton See every 4 weeks   #8 Vitamin D deficiency Vit D levels 23.1 on 3/13. Back down to 19  #9 Left upper extremity swelling- [place] neg for DVT PLAN:  Tumors markers and LFTs progressively improving. -s/p SRS radiation therapy   -Will pursue 4-6 cycles, and considering following this with Taxol -Continue ergocalciferol at current dose. 50k units 3 times a week. Recommended she take this with fatty foods to improve absorption. -f/u with radiation oncology and neuro-oncology for mx of brain metastases. -Continue Keppra  -Pt is in communication with Home Health -Will increase dose of Doxorubicin to 34m/m2 given continued improvement in liver functions, will continue 5033mm2 Cytoxan  -Discussed option of switching from Doxorubicin and Cyclophosphamide after C5  to Taxol, and the pt notes that she feels that she is tolerating treatment well  enough to continue with AC. Will reassess after completing C5. -Discussed pt labwork today, 10/09/18; no neutropenia, stable HGB at 10.2, PLT normalized to 182k, Alk Phos continues to improve now to 289, Bilirubin continues to be normal. Potassium a little low at 3.3 -Resume 10MEQ Potassium PO BID -Last CA 27.29 from 09/20/18 was improved to 158.3 -Last CA 15-3 from 09/20/18 was improved to 155.0 -Continue topical clobetasol for grade 1 bilateral posterior ankle rash -The pt has no prohibitive toxicities from continuing C5 Doxorubicin and Cyclophosphamide with G-CSF support at this time.   -Continue Faslodex and Xgeva every 4 weeks -Recommend cultured buttermilk or cultured yogurt to prevent thrush  -Recommended that the pt continue to eat well, drink at least 48-64 oz of water each day, and walk 20-30 minutes each day.    Continue Faslodex and Xgeva q4weeks Please schedule C6 of AC chemotherapy as ordered with labs and MD visit in 3weeks   All of the patients questions were answered with apparent satisfaction. The patient knows to call the clinic with any problems, questions or concerns.   The total time spent in the appt was 30 minutes and more than 50% was on counseling and direct patient cares.    GaSullivan LoneD MSElkoAHIVMS SCOak Valley District [facility] ([facility])TDublin Va Medical Centerematology/Oncology Physician CoMercy Hospital Joplin(Office):       [phone] cell):  [phone]):  [phone]  I, Schuyler Bain, am acting as a scribe for Dr. Gautam Kale.   .I have reviewed the above documentation for accuracy and completeness, and I agree with the above. .Gautam Kishore Kale MD   

## 2018-10-10 ENCOUNTER — Inpatient Hospital Stay: Payer: BC Managed Care – PPO

## 2018-10-10 ENCOUNTER — Telehealth: Payer: Self-pay | Admitting: *Deleted

## 2018-10-10 ENCOUNTER — Encounter: Payer: Self-pay | Admitting: Hematology

## 2018-10-10 ENCOUNTER — Inpatient Hospital Stay (HOSPITAL_BASED_OUTPATIENT_CLINIC_OR_DEPARTMENT_OTHER): Payer: BC Managed Care – PPO | Admitting: Hematology

## 2018-10-10 VITALS — BP 107/70 | HR 90 | Temp 97.9°F | Resp 16 | Ht 65.0 in | Wt 167.6 lb

## 2018-10-10 DIAGNOSIS — C7951 Secondary malignant neoplasm of bone: Secondary | ICD-10-CM

## 2018-10-10 DIAGNOSIS — G893 Neoplasm related pain (acute) (chronic): Secondary | ICD-10-CM

## 2018-10-10 DIAGNOSIS — C787 Secondary malignant neoplasm of liver and intrahepatic bile duct: Secondary | ICD-10-CM | POA: Diagnosis not present

## 2018-10-10 DIAGNOSIS — Z170 Estrogen receptor positive status [ER+]: Secondary | ICD-10-CM

## 2018-10-10 DIAGNOSIS — C7931 Secondary malignant neoplasm of brain: Secondary | ICD-10-CM

## 2018-10-10 DIAGNOSIS — C7800 Secondary malignant neoplasm of unspecified lung: Secondary | ICD-10-CM | POA: Diagnosis not present

## 2018-10-10 DIAGNOSIS — E559 Vitamin D deficiency, unspecified: Secondary | ICD-10-CM

## 2018-10-10 DIAGNOSIS — Z7189 Other specified counseling: Secondary | ICD-10-CM

## 2018-10-10 DIAGNOSIS — C50812 Malignant neoplasm of overlapping sites of left female breast: Secondary | ICD-10-CM | POA: Diagnosis not present

## 2018-10-10 DIAGNOSIS — C50919 Malignant neoplasm of unspecified site of unspecified female breast: Secondary | ICD-10-CM

## 2018-10-10 DIAGNOSIS — Z5111 Encounter for antineoplastic chemotherapy: Secondary | ICD-10-CM | POA: Diagnosis not present

## 2018-10-10 LAB — CBC WITH DIFFERENTIAL (CANCER CENTER ONLY)
Abs Immature Granulocytes: 0.24 10*3/uL — ABNORMAL HIGH (ref 0.00–0.07)
Basophils Absolute: 0.1 10*3/uL (ref 0.0–0.1)
Basophils Relative: 1 %
EOS ABS: 0 10*3/uL (ref 0.0–0.5)
Eosinophils Relative: 0 %
HCT: 32.8 % — ABNORMAL LOW (ref 36.0–46.0)
Hemoglobin: 10.2 g/dL — ABNORMAL LOW (ref 12.0–15.0)
Immature Granulocytes: 2 %
LYMPHS ABS: 0.7 10*3/uL (ref 0.7–4.0)
LYMPHS PCT: 7 %
MCH: 32.7 pg (ref 26.0–34.0)
MCHC: 31.1 g/dL (ref 30.0–36.0)
MCV: 105.1 fL — AB (ref 80.0–100.0)
MONO ABS: 1 10*3/uL (ref 0.1–1.0)
MONOS PCT: 9 %
NRBC: 2.1 % — AB (ref 0.0–0.2)
Neutro Abs: 8.8 10*3/uL — ABNORMAL HIGH (ref 1.7–7.7)
Neutrophils Relative %: 81 %
Platelet Count: 182 10*3/uL (ref 150–400)
RBC: 3.12 MIL/uL — ABNORMAL LOW (ref 3.87–5.11)
RDW: 17.9 % — ABNORMAL HIGH (ref 11.5–15.5)
WBC Count: 10.8 10*3/uL — ABNORMAL HIGH (ref 4.0–10.5)

## 2018-10-10 LAB — CMP (CANCER CENTER ONLY)
ALK PHOS: 289 U/L — AB (ref 38–126)
ALT: 48 U/L — AB (ref 0–44)
AST: 48 U/L — AB (ref 15–41)
Albumin: 2.7 g/dL — ABNORMAL LOW (ref 3.5–5.0)
Anion gap: 11 (ref 5–15)
BUN: 9 mg/dL (ref 8–23)
CALCIUM: 8.5 mg/dL — AB (ref 8.9–10.3)
CHLORIDE: 106 mmol/L (ref 98–111)
CO2: 26 mmol/L (ref 22–32)
CREATININE: 0.78 mg/dL (ref 0.44–1.00)
GFR, Estimated: >60 mL/min (ref >60)
Glucose, Bld: 237 mg/dL — ABNORMAL HIGH (ref 70–99)
Potassium: 3.3 mmol/L — ABNORMAL LOW (ref 3.5–5.1)
SODIUM: 143 mmol/L (ref 135–145)
Total Bilirubin: 0.6 mg/dL (ref 0.3–1.2)
Total Protein: 5.7 g/dL — ABNORMAL LOW (ref 6.5–8.1)

## 2018-10-10 LAB — MAGNESIUM: Magnesium: 2 mg/dL (ref 1.7–2.4)

## 2018-10-10 LAB — PHOSPHORUS: Phosphorus: 2.8 mg/dL (ref 2.5–4.6)

## 2018-10-10 MED ORDER — DOXORUBICIN HCL CHEMO IV INJECTION 2 MG/ML
50.0000 mg/m2 | Freq: Once | INTRAVENOUS | Status: AC
  Administered 2018-10-10: 90 mg via INTRAVENOUS
  Filled 2018-10-10: qty 45

## 2018-10-10 MED ORDER — SODIUM CHLORIDE 0.9 % IV SOLN
500.0000 mg/m2 | Freq: Once | INTRAVENOUS | Status: AC
  Administered 2018-10-10: 900 mg via INTRAVENOUS
  Filled 2018-10-10: qty 45

## 2018-10-10 MED ORDER — PALONOSETRON HCL INJECTION 0.25 MG/5ML
INTRAVENOUS | Status: AC
  Filled 2018-10-10: qty 5

## 2018-10-10 MED ORDER — SODIUM CHLORIDE 0.9 % IV SOLN
Freq: Once | INTRAVENOUS | Status: AC
  Administered 2018-10-10: 10:00:00 via INTRAVENOUS
  Filled 2018-10-10: qty 5

## 2018-10-10 MED ORDER — PALONOSETRON HCL INJECTION 0.25 MG/5ML
0.2500 mg | Freq: Once | INTRAVENOUS | Status: AC
  Administered 2018-10-10: 0.25 mg via INTRAVENOUS

## 2018-10-10 MED ORDER — HEPARIN SOD (PORK) LOCK FLUSH 100 UNIT/ML IV SOLN
500.0000 [IU] | Freq: Once | INTRAVENOUS | Status: AC | PRN
  Administered 2018-10-10: 500 [IU]
  Filled 2018-10-10: qty 5

## 2018-10-10 MED ORDER — SODIUM CHLORIDE 0.9 % IV SOLN
Freq: Once | INTRAVENOUS | Status: AC
  Administered 2018-10-10: 10:00:00 via INTRAVENOUS
  Filled 2018-10-10: qty 250

## 2018-10-10 MED ORDER — SODIUM CHLORIDE 0.9% FLUSH
10.0000 mL | INTRAVENOUS | Status: DC | PRN
  Administered 2018-10-10: 10 mL
  Filled 2018-10-10: qty 10

## 2018-10-10 NOTE — Patient Instructions (Signed)
[facility] Discharge Instructions for Patients Receiving Chemotherapy  Today you received the following chemotherapy agents: Doxorubicin (Adriamycin) and Cyclophosphamide (Cytoxan)  To help prevent nausea and vomiting after your treatment, we encourage you to take your nausea medication as directed.    If you develop nausea and vomiting that is not controlled by your nausea medication, call the clinic.   BELOW ARE SYMPTOMS THAT SHOULD BE REPORTED IMMEDIATELY:  *FEVER GREATER THAN 100.5 F  *CHILLS WITH OR WITHOUT FEVER  NAUSEA AND VOMITING THAT IS NOT CONTROLLED WITH YOUR NAUSEA MEDICATION  *UNUSUAL SHORTNESS OF BREATH  *UNUSUAL BRUISING OR BLEEDING  TENDERNESS IN MOUTH AND THROAT WITH OR WITHOUT PRESENCE OF ULCERS  *URINARY PROBLEMS  *BOWEL PROBLEMS  UNUSUAL RASH Items with * indicate a potential emergency and should be followed up as soon as possible.  Feel free to call the clinic should you have any questions or concerns. The clinic phone number is [phone].  Please show the [facility] at check-in to the Emergency Department and triage nurse.

## 2018-10-10 NOTE — Patient Instructions (Signed)
Thank you for choosing [facility] to provide your oncology and hematology care.  To afford each patient quality time with our providers, please arrive 30 minutes before your scheduled appointment time.  If you arrive late for your appointment, you may be asked to reschedule.  We strive to give you quality time with our providers, and arriving late affects you and other patients whose appointments are after yours.    If you are a no show for multiple scheduled visits, you may be dismissed from the clinic at the providers discretion.     Again, thank you for choosing [facility], our hope is that these requests will decrease the amount of time that you wait before being seen by our physicians.  ______________________________________________________________________   Should you have questions after your visit to the [facility], please contact our office at [phone] between the hours of 8:30 and 4:30 p.m.    Voicemails left after 4:30p.m will not be returned until the following business day.     For prescription refill requests, please have your pharmacy contact us directly.  Please also try to allow 48 hours for prescription requests.     Please contact the scheduling department for questions regarding scheduling.  For scheduling of procedures such as PET scans, CT scans, MRI, Ultrasound, etc please contact central scheduling at [phone].     Resources For Cancer Patients and Caregivers:    Oncolink.org:  A wonderful resource for patients and healthcare providers for information regarding your disease, ways to tract your treatment, what to expect, etc.      American Cancer Society:  800-227-2345  Can help patients locate various types of support and financial assistance   Cancer Care: 1-800-813-HOPE (4673) Provides financial assistance, online support groups, medication/co-pay assistance.     [place] DSS:  336-641-3447 Where to apply  for food stamps, Medicaid, and utility assistance   Medicare Rights Center: 800-333-4114 Helps people with Medicare understand their rights and benefits, navigate the Medicare system, and secure the quality healthcare they deserve   SCAT: 336-333-6589 [place] [employer]'s shared-ride transportation service for eligible riders who have a disability that prevents them from riding the fixed route bus.     For additional information on assistance programs please contact our social worker:   Abigail Elmore:  [phone]  

## 2018-10-10 NOTE — Telephone Encounter (Signed)
Patient has injection appt scheduled 11/29 [facility]) and would like to know if it is okay with Dr. Irene Limbo for her to come on 12/2 or 12/3 instead of 11/29

## 2018-10-12 ENCOUNTER — Inpatient Hospital Stay: Payer: BC Managed Care – PPO

## 2018-10-12 ENCOUNTER — Other Ambulatory Visit: Payer: Self-pay | Admitting: Hematology

## 2018-10-12 ENCOUNTER — Telehealth: Payer: Self-pay | Admitting: Hematology

## 2018-10-12 DIAGNOSIS — Z5111 Encounter for antineoplastic chemotherapy: Secondary | ICD-10-CM | POA: Diagnosis not present

## 2018-10-12 DIAGNOSIS — Z7189 Other specified counseling: Secondary | ICD-10-CM

## 2018-10-12 DIAGNOSIS — C50919 Malignant neoplasm of unspecified site of unspecified female breast: Secondary | ICD-10-CM

## 2018-10-12 DIAGNOSIS — C787 Secondary malignant neoplasm of liver and intrahepatic bile duct: Secondary | ICD-10-CM

## 2018-10-12 MED ORDER — PEGFILGRASTIM-CBQV 6 MG/0.6ML ~~LOC~~ SOSY
PREFILLED_SYRINGE | SUBCUTANEOUS | Status: AC
  Filled 2018-10-12: qty 0.6

## 2018-10-12 MED ORDER — PEGFILGRASTIM-CBQV 6 MG/0.6ML ~~LOC~~ SOSY
6.0000 mg | PREFILLED_SYRINGE | Freq: Once | SUBCUTANEOUS | Status: AC
  Administered 2018-10-12: 6 mg via SUBCUTANEOUS

## 2018-10-12 NOTE — Telephone Encounter (Signed)
Patient wanted her injection rescheduled from Nov 29 to Dec 2.

## 2018-10-12 NOTE — Patient Instructions (Signed)
Pegfilgrastim injection What is this medicine? PEGFILGRASTIM (PEG fil gra stim) is a long-acting granulocyte colony-stimulating factor that stimulates the growth of neutrophils, a type of white blood cell important in the body's fight against infection. It is used to reduce the incidence of fever and infection in patients with certain types of cancer who are receiving chemotherapy that affects the bone marrow, and to increase survival after being exposed to high doses of radiation. This medicine may be used for other purposes; ask your health care provider or pharmacist if you have questions. COMMON BRAND NAME(S): Neulasta What should I tell my health care provider before I take this medicine? They need to know if you have any of these conditions: -kidney disease -latex allergy -ongoing radiation therapy -sickle cell disease -skin reactions to acrylic adhesives (On-Body Injector only) -an unusual or allergic reaction to pegfilgrastim, filgrastim, other medicines, foods, dyes, or preservatives -pregnant or trying to get pregnant -breast-feeding How should I use this medicine? This medicine is for injection under the skin. If you get this medicine at home, you will be taught how to prepare and give the pre-filled syringe or how to use the On-body Injector. Refer to the patient Instructions for Use for detailed instructions. Use exactly as directed. Tell your healthcare provider immediately if you suspect that the On-body Injector may not have performed as intended or if you suspect the use of the On-body Injector resulted in a missed or partial dose. It is important that you put your used needles and syringes in a special sharps container. Do not put them in a trash can. If you do not have a sharps container, call your pharmacist or healthcare provider to get one. Talk to your pediatrician regarding the use of this medicine in children. While this drug may be prescribed for selected conditions,  precautions do apply. Overdosage: If you think you have taken too much of this medicine contact a poison control center or emergency room at once. NOTE: This medicine is only for you. Do not share this medicine with others. What if I miss a dose? It is important not to miss your dose. Call your doctor or health care professional if you miss your dose. If you miss a dose due to an On-body Injector failure or leakage, a new dose should be administered as soon as possible using a single prefilled syringe for manual use. What may interact with this medicine? Interactions have not been studied. Give your health care provider a list of all the medicines, herbs, non-prescription drugs, or dietary supplements you use. Also tell them if you smoke, drink alcohol, or use illegal drugs. Some items may interact with your medicine. This list may not describe all possible interactions. Give your health care provider a list of all the medicines, herbs, non-prescription drugs, or dietary supplements you use. Also tell them if you smoke, drink alcohol, or use illegal drugs. Some items may interact with your medicine. What should I watch for while using this medicine? You may need blood work done while you are taking this medicine. If you are going to need a MRI, CT scan, or other procedure, tell your doctor that you are using this medicine (On-Body Injector only). What side effects may I notice from receiving this medicine? Side effects that you should report to your doctor or health care professional as soon as possible: -allergic reactions like skin rash, itching or hives, swelling of the face, lips, or tongue -dizziness -fever -pain, redness, or irritation at site   where injected -pinpoint red spots on the skin -red or dark-brown urine -shortness of breath or breathing problems -stomach or side pain, or pain at the shoulder -swelling -tiredness -trouble passing urine or change in the amount of urine Side  effects that usually do not require medical attention (report to your doctor or health care professional if they continue or are bothersome): -bone pain -muscle pain This list may not describe all possible side effects. Call your doctor for medical advice about side effects. You may report side effects to FDA at 1-800-FDA-1088. Where should I keep my medicine? Keep out of the reach of children. Store pre-filled syringes in a refrigerator between 2 and 8 degrees C (36 and 46 degrees F). Do not freeze. Keep in carton to protect from light. Throw away this medicine if it is left out of the refrigerator for more than 48 hours. Throw away any unused medicine after the expiration date. NOTE: This sheet is a summary. It may not cover all possible information. If you have questions about this medicine, talk to your doctor, pharmacist, or health care provider.  2018 Elsevier/[employer] Standard (2016-11-04 12:58:03)  

## 2018-10-20 ENCOUNTER — Ambulatory Visit: Payer: BC Managed Care – PPO

## 2018-10-23 ENCOUNTER — Inpatient Hospital Stay: Payer: BC Managed Care – PPO | Attending: Hematology

## 2018-10-23 ENCOUNTER — Other Ambulatory Visit: Payer: Self-pay | Admitting: Hematology

## 2018-10-23 ENCOUNTER — Ambulatory Visit
Admission: RE | Admit: 2018-10-23 | Discharge: 2018-10-23 | Disposition: A | Discharge status: Home / Self Care | Payer: BC Managed Care – PPO | Source: Ambulatory Visit | Attending: Internal Medicine | Admitting: Internal Medicine

## 2018-10-23 DIAGNOSIS — C50812 Malignant neoplasm of overlapping sites of left female breast: Secondary | ICD-10-CM | POA: Diagnosis present

## 2018-10-23 DIAGNOSIS — C787 Secondary malignant neoplasm of liver and intrahepatic bile duct: Secondary | ICD-10-CM | POA: Insufficient documentation

## 2018-10-23 DIAGNOSIS — R21 Rash and other nonspecific skin eruption: Secondary | ICD-10-CM | POA: Diagnosis not present

## 2018-10-23 DIAGNOSIS — G893 Neoplasm related pain (acute) (chronic): Secondary | ICD-10-CM | POA: Diagnosis not present

## 2018-10-23 DIAGNOSIS — Z170 Estrogen receptor positive status [ER+]: Secondary | ICD-10-CM | POA: Diagnosis not present

## 2018-10-23 DIAGNOSIS — C7931 Secondary malignant neoplasm of brain: Secondary | ICD-10-CM

## 2018-10-23 DIAGNOSIS — Z5111 Encounter for antineoplastic chemotherapy: Secondary | ICD-10-CM | POA: Insufficient documentation

## 2018-10-23 DIAGNOSIS — C7800 Secondary malignant neoplasm of unspecified lung: Secondary | ICD-10-CM | POA: Insufficient documentation

## 2018-10-23 DIAGNOSIS — Z7189 Other specified counseling: Secondary | ICD-10-CM

## 2018-10-23 DIAGNOSIS — M7989 Other specified soft tissue disorders: Secondary | ICD-10-CM | POA: Diagnosis not present

## 2018-10-23 DIAGNOSIS — R569 Unspecified convulsions: Secondary | ICD-10-CM | POA: Diagnosis not present

## 2018-10-23 DIAGNOSIS — C7951 Secondary malignant neoplasm of bone: Secondary | ICD-10-CM | POA: Insufficient documentation

## 2018-10-23 DIAGNOSIS — Z95828 Presence of other vascular implants and grafts: Secondary | ICD-10-CM

## 2018-10-23 DIAGNOSIS — E559 Vitamin D deficiency, unspecified: Secondary | ICD-10-CM | POA: Diagnosis not present

## 2018-10-23 DIAGNOSIS — Z5189 Encounter for other specified aftercare: Secondary | ICD-10-CM | POA: Diagnosis not present

## 2018-10-23 MED ORDER — DENOSUMAB 120 MG/1.7ML ~~LOC~~ SOLN
SUBCUTANEOUS | Status: AC
  Filled 2018-10-23: qty 1.7

## 2018-10-23 MED ORDER — GADOBENATE DIMEGLUMINE 529 MG/ML IV SOLN
15.0000 mL | Freq: Once | INTRAVENOUS | Status: AC | PRN
  Administered 2018-10-23: 15 mL via INTRAVENOUS

## 2018-10-23 MED ORDER — DENOSUMAB 120 MG/1.7ML ~~LOC~~ SOLN
120.0000 mg | Freq: Once | SUBCUTANEOUS | Status: AC
  Administered 2018-10-23: 120 mg via SUBCUTANEOUS

## 2018-10-23 MED ORDER — CLOBETASOL PROPIONATE 0.05 % EX CREA: 1.0000 "application " | TOPICAL_CREAM | Freq: Two times a day (BID) | CUTANEOUS | 0 refills | Status: DC

## 2018-10-23 MED ORDER — FULVESTRANT 250 MG/5ML IM SOLN
INTRAMUSCULAR | Status: AC
  Filled 2018-10-23: qty 10

## 2018-10-23 MED ORDER — FULVESTRANT 250 MG/5ML IM SOLN
500.0000 mg | Freq: Once | INTRAMUSCULAR | Status: AC
  Administered 2018-10-23: 500 mg via INTRAMUSCULAR

## 2018-10-23 NOTE — Patient Instructions (Signed)
Fulvestrant injection What is this medicine? FULVESTRANT (ful VES trant) blocks the effects of estrogen. It is used to treat breast cancer. This medicine may be used for other purposes; ask your health care provider or pharmacist if you have questions. COMMON BRAND NAME(S): FASLODEX What should I tell my health care provider before I take this medicine? They need to know if you have any of these conditions: -bleeding problems -liver disease -low levels of platelets in the blood -an unusual or allergic reaction to fulvestrant, other medicines, foods, dyes, or preservatives -pregnant or trying to get pregnant -breast-feeding How should I use this medicine? This medicine is for injection into a muscle. It is usually given by a health care professional in a hospital or clinic setting. Talk to your pediatrician regarding the use of this medicine in children. Special care may be needed. Overdosage: If you think you have taken too much of this medicine contact a poison control center or emergency room at once. NOTE: This medicine is only for you. Do not share this medicine with others. What if I miss a dose? It is important not to miss your dose. Call your doctor or health care professional if you are unable to keep an appointment. What may interact with this medicine? -medicines that treat or prevent blood clots like warfarin, enoxaparin, and dalteparin This list may not describe all possible interactions. Give your health care provider a list of all the medicines, herbs, non-prescription drugs, or dietary supplements you use. Also tell them if you smoke, drink alcohol, or use illegal drugs. Some items may interact with your medicine. What should I watch for while using this medicine? Your condition will be monitored carefully while you are receiving this medicine. You will need important blood work done while you are taking this medicine. Do not become pregnant while taking this medicine or for  at least 1 year after stopping it. Women of child-bearing potential will need to have a negative pregnancy test before starting this medicine. Women should inform their doctor if they wish to become pregnant or think they might be pregnant. There is a potential for serious side effects to an unborn child. Men should inform their doctors if they wish to father a child. This medicine may lower sperm counts. Talk to your health care professional or pharmacist for more information. Do not breast-feed an infant while taking this medicine or for 1 year after the last dose. What side effects may I notice from receiving this medicine? Side effects that you should report to your doctor or health care professional as soon as possible: -allergic reactions like skin rash, itching or hives, swelling of the face, lips, or tongue -feeling faint or lightheaded, falls -pain, tingling, numbness, or weakness in the legs -signs and symptoms of infection like fever or chills; cough; flu-like symptoms; sore throat -vaginal bleeding Side effects that usually do not require medical attention (report to your doctor or health care professional if they continue or are bothersome): -aches, pains -constipation -diarrhea -headache -hot flashes -nausea, vomiting -pain at site where injected -stomach pain This list may not describe all possible side effects. Call your doctor for medical advice about side effects. You may report side effects to FDA at 1-800-FDA-1088. Where should I keep my medicine? This drug is given in a hospital or clinic and will not be stored at home. NOTE: This sheet is a summary. It may not cover all possible information. If you have questions about this medicine, talk to your   doctor, pharmacist, or health care provider.  2018 Elsevier/Gold Standard (2015-06-06 11:03:55) Denosumab injection What is this medicine? DENOSUMAB (den oh sue mab) slows bone breakdown. Prolia is used to treat osteoporosis in  women after menopause and in men. Delton See is used to treat a high calcium level due to cancer and to prevent bone fractures and other bone problems caused by multiple myeloma or cancer bone metastases. Delton See is also used to treat giant cell tumor of the bone. This medicine may be used for other purposes; ask your health care provider or pharmacist if you have questions. COMMON BRAND NAME(S): Prolia, XGEVA What should I tell my health care provider before I take this medicine? They need to know if you have any of these conditions: -dental disease -having surgery or tooth extraction -infection -kidney disease -low levels of calcium or Vitamin D in the blood -malnutrition -on hemodialysis -skin conditions or sensitivity -thyroid or parathyroid disease -an unusual reaction to denosumab, other medicines, foods, dyes, or preservatives -pregnant or trying to get pregnant -breast-feeding How should I use this medicine? This medicine is for injection under the skin. It is given by a health care professional in a hospital or clinic setting. If you are getting Prolia, a special MedGuide will be given to you by the pharmacist with each prescription and refill. Be sure to read this information carefully each time. For Prolia, talk to your pediatrician regarding the use of this medicine in children. Special care may be needed. For Delton See, talk to your pediatrician regarding the use of this medicine in children. While this drug may be prescribed for children as young as 13 years for selected conditions, precautions do apply. Overdosage: If you think you have taken too much of this medicine contact a poison control center or emergency room at once. NOTE: This medicine is only for you. Do not share this medicine with others. What if I miss a dose? It is important not to miss your dose. Call your doctor or health care professional if you are unable to keep an appointment. What may interact with this  medicine? Do not take this medicine with any of the following medications: -other medicines containing denosumab This medicine may also interact with the following medications: -medicines that lower your chance of fighting infection -steroid medicines like prednisone or cortisone This list may not describe all possible interactions. Give your health care provider a list of all the medicines, herbs, non-prescription drugs, or dietary supplements you use. Also tell them if you smoke, drink alcohol, or use illegal drugs. Some items may interact with your medicine. What should I watch for while using this medicine? Visit your doctor or health care professional for regular checks on your progress. Your doctor or health care professional may order blood tests and other tests to see how you are doing. Call your doctor or health care professional for advice if you get a fever, chills or sore throat, or other symptoms of a cold or flu. Do not treat yourself. This drug may decrease your body's ability to fight infection. Try to avoid being around people who are sick. You should make sure you get enough calcium and vitamin D while you are taking this medicine, unless your doctor tells you not to. Discuss the foods you eat and the vitamins you take with your health care professional. See your dentist regularly. Brush and floss your teeth as directed. Before you have any dental work done, tell your dentist you are receiving this medicine. Do  not become pregnant while taking this medicine or for 5 months after stopping it. Talk with your doctor or health care professional about your birth control options while taking this medicine. Women should inform their doctor if they wish to become pregnant or think they might be pregnant. There is a potential for serious side effects to an unborn child. Talk to your health care professional or pharmacist for more information. What side effects may I notice from receiving this  medicine? Side effects that you should report to your doctor or health care professional as soon as possible: -allergic reactions like skin rash, itching or hives, swelling of the face, lips, or tongue -bone pain -breathing problems -dizziness -jaw pain, especially after dental work -redness, blistering, peeling of the skin -signs and symptoms of infection like fever or chills; cough; sore throat; pain or trouble passing urine -signs of low calcium like fast heartbeat, muscle cramps or muscle pain; pain, tingling, numbness in the hands or feet; seizures -unusual bleeding or bruising -unusually weak or tired Side effects that usually do not require medical attention (report to your doctor or health care professional if they continue or are bothersome): -constipation -diarrhea -headache -joint pain -loss of appetite -muscle pain -runny nose -tiredness -upset stomach This list may not describe all possible side effects. Call your doctor for medical advice about side effects. You may report side effects to FDA at 1-800-FDA-1088. Where should I keep my medicine? This medicine is only given in a clinic, doctor's office, or other health care setting and will not be stored at home. NOTE: This sheet is a summary. It may not cover all possible information. If you have questions about this medicine, talk to your doctor, pharmacist, or health care provider.  2018 [employer]/Gold Standard (2016-11-30 19:17:21)  

## 2018-10-23 NOTE — Progress Notes (Signed)
OK to give Xgeva injection today with no CMET per Dr. Irene Limbo.    Kasandra Knudsen LPN

## 2018-10-25 ENCOUNTER — Telehealth: Payer: Self-pay | Admitting: *Deleted

## 2018-10-25 ENCOUNTER — Other Ambulatory Visit: Payer: Self-pay | Admitting: Radiation Therapy

## 2018-10-25 NOTE — Progress Notes (Signed)
FMLA successfully faxed to Emily Foust at [phone]. Mailed copy to patient address on file. 

## 2018-10-25 NOTE — Telephone Encounter (Signed)
Patient called with concern r/t injection site of xgeva 10/23/18. States area of injection slightly bruised and sore to touch. Denies swelling or warmth at site or elevated temerature. Wanted to know if she should come in for evaluation. Per Dr. Irene Limbo: no physical evaluation/observation needed at this point. If site changes in appearence or elevated temperature, please notify office.  Advised patient of Dr. Grier Mitts instructions, patient verbalized understanding.

## 2018-10-29 ENCOUNTER — Other Ambulatory Visit: Payer: Self-pay | Admitting: Hematology

## 2018-10-30 ENCOUNTER — Other Ambulatory Visit: Payer: Self-pay

## 2018-10-30 NOTE — Progress Notes (Signed)
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  INTERVAL HISTORY   Stacey Cox is here for follow-up of her metastatic hormone positive HER-2 negative breast cancer. The patient's last visit with [place] was on 10/10/18. She is accompanied today by her friend. The pt reports that she is doing well overall and recently enjoyed Thanksgiving with her family.   The pt reports that she has been eating very well. She notes that Aquaphor ointment application BID on the posterior of her ankles has greatly resolved her previous rash. The pt notes that she has had relatively good energy levels, and is better when she forces herself to get out and doing things she enjoys. The pt denies any back pain, pain along the spine and abdominal pains.   The pt notes that the swelling in her left arm has continued to slowly improve.   Of note since the patient's last visit, pt has had a MRI Brain completed on 10/23/18 with results revealing SRS protocol demonstrating treatment response: Regression of LEFT frontal dural metastasis, decreased vasogenic edema and parenchymal invasion. Similar calvarial metastasis. 2. Stable appearance of 2 small posterior fossa meningiomas. 3. No acute intracranial process.  Lab results today (10/31/18) of CBC w/diff and CMP is as follows: all values are WNL except for WBC at 12.2k, RBC at 3.22, HGB at 10.4, HCT at 33.0, MCV at 102.5, RDW at 17.4, nRBC at 2.1%, ANC at 9.4k, Monocytes abs at 1.7k, Abs Immature granulocytes at 0.36k, Total Protein at 5.9, Albumin at 2.9, AST at 45, ALT at 46, Alk Phos at 236. 10/31/18 Magnesium at 2.1 10/31/18 CA27.29 is pending 10/31/18 CA15-3 is pending   On review of systems, pt  reports eating well, rash resolution on posterior ankles, good energy levels, increased activity levels, resolving left arm swelling, and denies new skin rashes, fevers, chills, concerns for infections, diarrhea, pain along the spine, back pain, abdominal pains, and any other symptoms.   MEDICAL HISTORY:  Past Medical History:  Diagnosis Date  . Cancer [facility])    Metastatic Breast Cancer    SURGICAL HISTORY: Past Surgical History:  Procedure Laterality Date  . CHOLECYSTECTOMY    . IR IMAGING GUIDED PORT INSERTION  07/14/2018  . TONSILLECTOMY      SOCIAL HISTORY: Social History   Socioeconomic History  . Marital status: Single    Spouse name: Not on file  . Number of children: Not on file  . Years of education: Not on file  . Highest education level: Not on file  Occupational History  . Not on file  Social Needs  . Financial resource strain: Not on file  . Food insecurity:    Worry: Not on file    Inability: Not on file  . Transportation needs:    Medical: Not on file    Non-medical: Not on file  Tobacco Use  . Smoking status: Current Every Day Smoker    Packs/day: 0.15    Types: Cigarettes    Last attempt to quit: 01/21/2017    Years since quitting: 1.7  . Smokeless tobacco: Never Used  Substance and Sexual Activity  . Alcohol use: No    Alcohol/week: 0.0 standard drinks  . Drug use: No  .  Sexual activity: Never  Lifestyle  . Physical activity:    Days per week: Not on file    Minutes per session: Not on file  . Stress: Not on file  Relationships  . Social connections:    Talks on phone: Not on file    Gets together: Not on file    Attends religious service: Not on file    Active member of club or organization: Not on file    Attends meetings of clubs or organizations: Not on file    Relationship status: Not on file  . Intimate partner violence:    Fear of current or ex partner: No    Emotionally abused: No    Physically abused: No    Forced sexual  activity: No  Other Topics Concern  . Not on file  Social History Narrative   Lives alone. 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 Has phobia of the medical system.  Cigarette smoker 1/2 PPD for about 40 yrs Social alcohol use No drugs Professor at [employer] no children  FAMILY HISTORY: Family History  Problem Relation Age of Onset  . Cancer Mother        breast  . Cancer Sister        breast  Mother with h/o breast cancer in her 29's Sister with breast cancer in her 90ys and later was diagnosed with multiple myeloma. (Patient is not aware of any specific breast cancer mutations present)  ALLERGIES:  is allergic to thorazine [chlorpromazine] and ribociclib.  MEDICATIONS:  Current Outpatient Medications  Medication Sig Dispense Refill  . aspirin EC 81 MG tablet Take 1 tablet (81 mg total) by mouth daily.    . clobetasol cream (TEMOVATE) 5.49 % Apply 1 application topically 2 (two) times daily. 30 g 0  . dexamethasone (DECADRON) 2 MG tablet Take 1 tablet (2 mg total) by mouth daily. (Patient not taking: Reported on 10/10/2018) 30 tablet 2  . Emollient (AQUAPHOR EX) Apply topically. 2 x day to lower legs    . escitalopram (LEXAPRO) 20 MG tablet TAKE 1 TABLET(20 MG) BY MOUTH DAILY 30 tablet 0  . feeding supplement, ENSURE ENLIVE, (ENSURE ENLIVE) LIQD Take 237 mLs by mouth 2 (two) times daily between meals. (Patient taking differently: Take 237-474 mLs by mouth 2 (two) times daily between meals. ) 237 mL 12  . furosemide (LASIX) 20 MG tablet Take 1 tablet (20 mg total) by mouth daily. 5 tablet 0  . levETIRAcetam (KEPPRA) 750 MG tablet Take 1 tablet (750 mg total) by mouth 2 (two) times daily. 60 tablet 3  . lidocaine-prilocaine (EMLA) cream Apply to affected area once 30 g 3  . loperamide (IMODIUM) 1 MG/5ML solution Take 2 mg by mouth as needed for diarrhea or loose stools.     . nystatin (MYCOSTATIN) 100000 UNIT/ML suspension Take 5 mLs (500,000 Units total) by mouth 4 (four) times  daily. 473 mL 0  . pantoprazole (PROTONIX) 40 MG tablet TAKE 1 TABLET(40 MG) BY MOUTH DAILY 30 tablet 1  . potassium chloride (K-DUR) 10 MEQ tablet Take 1 tablet (10 mEq total) by mouth daily. (Patient not taking: Reported on 10/10/2018) 5 tablet 0  . potassium chloride SA (K-DUR,KLOR-CON) 20 MEQ tablet TAKE 1 TABLET(20 MEQ) BY MOUTH TWICE DAILY 30 tablet 0  . Vitamin D, Ergocalciferol, (DRISDOL) 50000 units CAPS capsule TAKE 1 CAPSULE BY MOUTH 3 TIMES WEEKLY (Patient taking differently: Take 50,000 Units by mouth. TAKE 1 CAPSULE BY MOUTH 3 TIMES WEEKLY) 24 capsule 0  No current facility-administered medications for this visit.     REVIEW OF SYSTEMS:    A 10+ POINT REVIEW OF SYSTEMS WAS OBTAINED including neurology, dermatology, psychiatry, cardiac, respiratory, lymph, extremities, GI, GU, Musculoskeletal, constitutional, breasts, reproductive, HEENT.  All pertinent positives are noted in the HPI.  All others are negative.   PHYSICAL EXAMINATION:  ECOG PERFORMANCE STATUS: 1 - Symptomatic but completely ambulatory  Vitals:   10/31/18 1150  BP: 113/87  Pulse: 80  Resp: 18  Temp: 98.1 F (36.7 C)  SpO2: 100%   Filed Weights   10/31/18 1150  Weight: 165 lb (74.8 kg)   .Body mass index is 27.46 kg/m.  GENERAL:alert, in no acute distress and comfortable SKIN: no new rashes, no significant lesions EYES: conjunctiva are pink and non-injected, sclera anicteric OROPHARYNX: MMM, no exudates, no oropharyngeal erythema or ulceration NECK: supple, no JVD LYMPH:  no palpable lymphadenopathy in the cervical, axillary or inguinal regions LUNGS: clear to auscultation b/l with normal respiratory effort HEART: regular rate & rhythm ABDOMEN:  normoactive bowel sounds , non tender, not distended. No palpable hepatosplenomegaly.  Extremity: 1+ pedal edema PSYCH: alert & oriented x 3 with fluent speech NEURO: no focal motor/sensory deficits   LABORATORY DATA:  I have reviewed the data as  listed  . CBC Latest Ref Rng & Units 10/31/2018 10/10/2018 09/20/2018  WBC 4.0 - 10.5 K/uL 12.2(H) 10.8(H) 11.1(H)  Hemoglobin 12.0 - 15.0 g/dL 10.4(L) 10.2(L) 10.5(L)  Hematocrit 36.0 - 46.0 % 33.0(L) 32.8(L) 32.9(L)  Platelets 150 - 400 K/uL 192 182 139(L)   CBC    Component Value Date/Time   WBC 12.2 (H) 10/31/2018 1121   RBC 3.22 (L) 10/31/2018 1121   HGB 10.4 (L) 10/31/2018 1121   HGB 10.2 (L) 10/10/2018 0823   HGB 13.9 11/24/2017 1340   HCT 33.0 (L) 10/31/2018 1121   HCT 42.0 11/24/2017 1340   PLT 192 10/31/2018 1121   PLT 182 10/10/2018 0823   PLT 212 11/24/2017 1340   MCV 102.5 (H) 10/31/2018 1121   MCV 97.0 11/24/2017 1340   MCH 32.3 10/31/2018 1121   MCHC 31.5 10/31/2018 1121   RDW 17.4 (H) 10/31/2018 1121   RDW 14.1 11/24/2017 1340   LYMPHSABS 0.7 10/31/2018 1121   LYMPHSABS 1.4 11/24/2017 1340   MONOABS 1.7 (H) 10/31/2018 1121   MONOABS 0.3 11/24/2017 1340   EOSABS 0.0 10/31/2018 1121   EOSABS 0.1 11/24/2017 1340   BASOSABS 0.1 10/31/2018 1121   BASOSABS 0.1 11/24/2017 1340    . CMP Latest Ref Rng & Units 10/31/2018 10/10/2018 09/20/2018  Glucose 70 - 99 mg/dL 80 237(H) 190(H)  BUN 8 - 23 mg/dL 12 9 9   Creatinine 0.44 - 1.00 mg/dL 0.66 0.78 0.75  Sodium 135 - 145 mmol/L 142 143 141  Potassium 3.5 - 5.1 mmol/L 3.7 3.3(L) 3.4(L)  Chloride 98 - 111 mmol/L 108 106 108  CO2 22 - 32 mmol/L 25 26 24   Calcium 8.9 - 10.3 mg/dL 9.0 8.5(L) 8.3(L)  Total Protein 6.5 - 8.1 g/dL 5.9(L) 5.7(L) 5.4(L)  Total Bilirubin 0.3 - 1.2 mg/dL 0.4 0.6 0.9  Alkaline Phos 38 - 126 U/L 236(H) 289(H) 435(H)  AST 15 - 41 U/L 45(H) 48(H) 46(H)  ALT 0 - 44 U/L 46(H) 48(H) 42       07/18/18 Pathology:          RADIOGRAPHIC STUDIES: I have personally reviewed the radiological images as listed and agreed with the findings in the report. Mr Brain  W Wo Contrast  Result Date: 10/23/2018 CLINICAL DATA:  Restaging of metastatic breast cancer. Status post 5 brain chemo  radiation treatment since October 2019. Blurry vision. EXAM: MRI HEAD WITHOUT AND WITH CONTRAST TECHNIQUE: Multiplanar, multiecho pulse sequences of the brain and surrounding structures were obtained without and with intravenous contrast. CONTRAST:  91m MULTIHANCE GADOBENATE DIMEGLUMINE 529 MG/ML IV SOLN COMPARISON:  None. FINDINGS: BRAIN New Lesions: None. Larger lesions: None. Stable or Smaller lesions: LEFT frontal dural-based 7 x 23 x 30 mm metastasis was 27 x 28 x 49 mm. Decreased associated smooth dural thickening and decreased parenchymal invasion. Other: INTRACRANIAL CONTENTS: Markedly improved, mild residual LEFT frontal vasogenic edema. No midline shift. Re-expanded LEFT lateral ventricle. No reduced diffusion to suggest acute ischemia. No susceptibility artifact to suggest hemorrhage, minimal susceptibility artifact associated with LEFT dural metastasis. Bilateral inferior cerebellar atrophy, unchanged. No hydrocephalus. A few scattered subcentimeter supratentorial white matter FLAIR T2 hyperintensities exclusive aforementioned abnormality compatible with mild chronic small vessel ischemic changes. No abnormal extra-axial fluid collections. Stable appearance of low T2, homogeneously enhancing 12 mm RIGHT petrous apex meningioma with regional mass effect. Stable appearance of 6 mm prepontine meningioma. VASCULAR: Normal major intracranial vascular flow voids present at skull base. SKULL AND UPPER CERVICAL SPINE: No abnormal sellar expansion. Diffuse osseous metastasis with heterogeneous enhancement. Craniocervical junction maintained. SINUSES/ORBITS: Mild LEFT maxillary sinus mucosal thickening with small mucosal retention cyst. Included ocular globes and orbital contents are non-suspicious. OTHER: None. IMPRESSION: 1. SRS protocol demonstrating treatment response: Regression of LEFT frontal dural metastasis, decreased vasogenic edema and parenchymal invasion. Similar calvarial metastasis. 2. Stable  appearance of 2 small posterior fossa meningiomas. 3. No acute intracranial process. Electronically Signed   By: CElon AlasM.D.   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 Multifocal tumor in the left breast with biopsy-proven left axillary metastases.   Noted to have extensive bone metastases and pulmonary metastases. Patient was noted to have calvarial metastases but no overt parenchymal metastasis 09/14/17 CT Chest/ad/pelvis Results discussed in details - good response.  02/22/18 CT revealed  Newly apparent 2.1 by 2.0 cm rim enhancing lesion in the right hepatic lobe suspicious for a metastatic lesion. This was not apparent on prior exams although the prior exams were all noncontrast and thus the lesion may have been present but with reduced conspicuity. There is also a small enhancing lesion further posteriorly in the right hepatic lobe which is technically nonspecific and could be a small hemangioma or a small metastatic lesion. Stable distribution and appearance of prior sclerotic osseous metastatic disease without bony progression. 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   07/06/18 CT C/A/P which revealed New pattern of heterogeneous enhancement and enhancing ill-defined lesions in the central LEFT and RIGHT hepatic lobe at site prior enhancing lesion is highly concerning for progression of of infiltrative malignancy in the liver occupying a large portion of the RIGHT hepatic lobe and central LEFT hepatic lobe. 2. Small amount of free fluid in the abdomen pelvis. 3. No metastatic adenopathy.4. No evidence pulmonary metastasis or lymphadenopathy. 5. Stable dense sclerotic skeletal metastasis   07/11/18 ECHO revealed LV EF of 55%-60%   07/18/18 Liver needle/core biopsy right  lesion consi with Ki67 at 40%, ER/PR +ve   07/28/18 MRI Abdomen  MRCP revealed Extensive hepatic metastatic disease, possibly progressive from recent abdominal CT. 2. No evidence of intrahepatic or extrahepatic biliary dilatation. Elevated liver function studies may be secondary to intrahepatic cholestasis. 3. Increased ascites without peritoneal nodularity or suspicious enhancement. 4. Grossly stable widespread osseous metastatic disease.   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 Much improved back pain.  # 3 Neutropenia Related to her Ribociclib - resolved. Patient is currently on Verzenio and has not developed any neutropenia. This is being monitored.  #4 Increased LFTs due to new liver metastases  #5 Cancer related pain - controlled - status post palliative RT to spine - no currently needing any pain medications.  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 No other new medication. Now resolved. Monitoring on increasing doses of Verzenio. No issues with recurrent rash on Verzenio 167m po BID  #7 Brain metastasis  07/20/18 CT Head revealed 1.8 x 2.4 x 3.9 cm LEFT frontal cortical based mass with possible dural component highly concerning for metastatic disease, less likely abscess or cerebritis. Severe vasogenic edema with 5 mm LEFT to RIGHT subfalcine herniation. No ventricular entrapment. 2. 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 3. Multiple calvarial metastasis. 4. Cerebellar atrophy.    07/21/18 MRI Brain revealed  Irregular plaque-like mass, likely metastasis, measuring up to 5.1 cm, appears to originate from the left anterolateral frontal dura with invasion of the underlying frontal lobe of the brain. Mass effect from brain edema and the lesion partially effaces the frontal horn of left lateral ventricle and results in 7 mm left-to-right midline shift of anterior septum pellucidum. 2. Diffuse dural thickening over  the left cerebral convexity may represent edema associated with the tumor or invasive neoplasm. 3. 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   07/28/18 MRI Brain revealed Extensive nodular dural enhancement left frontal lobe with associated enhancing mass growing in the left frontal lobe is unchanged in size and compatible with metastatic disease. This is associated with metastatic disease to the left frontal bone. Extensive edema in the left frontal lobe has progressed in the interval. Blastic metastatic disease throughout the calvarium. Metastatic disease in the cervical spine. Enhancing mass along the floor of the posterior fossa on the right is stable and most consistent with meningioma. 6 mm enhancing mass in the prepontine cistern on the right also unchanged and compatible with meningioma.   PLAN:  -Continue follow up with Dr. VMickeal Skinnerin Neuro-oncology and Dr. MTammi Klippelin RTurontaking Claritin for Neulasta related pain -Continue with XDelton Seeevery 4 weeks   #8 Vitamin D deficiency Vit D levels 23.1 on 3/13. Back down to 19  #9 Left upper extremity swelling- UKoreaneg for DVT  PLAN:  -Discussed pt labwork today, 10/31/18; liver functions are improved with AST to 45, ALT to 46, Alk Phos to 236. HGB stable at 10.4, ANC at 9.4k in setting of G-CSF support. PLT normal at 192k.  -10/31/18 CA27.29 and CA15-3 are pending  -Discussed the 10/23/18 MRI Brain which revealed SRS protocol demonstrating treatment response: Regression of LEFT frontal dural metastasis, decreased vasogenic edema and parenchymal invasion. Similar calvarial metastasis. 2. Stable appearance of 2 small posterior fossa meningiomas. 3. No acute intracranial process.  -The pt has no prohibitive toxicities from continuing C6 AC with G-CSF support at this time. -Did increase dose of Doxorubicin to 567mm2 given continued improvement in liver functions, will continue  511m/m2 Cytoxan     -Will complete CT C/A/P in 5 weeks  -Will consider beginning Taxol in addition to Faslodex at next visit and after completing C6 AC -Follow up with Neuro-Onc Dr. VMickeal Skinneron 11/07/18   -Continue follow up with Rad Onc -Continue Keppra  -Continue 10MEQ Potassium PO BID replacement  -Continue Ergocalciferol 50k units, three times a week, with fatty foods to aide absorption  -Continue Faslodex and resume Xgeva every 4 weeks  -s/p SRS radiation therapy   -Pt is in communication with HPerrycultured buttermilk or cultured yogurt to prevent thrush -Will see the pt back in 6 weeks    -Please move Faslodex and Xgeva from 12/27 to 11/27/2018 per patients preference (will be out of town) -plz change subsequent q4weekly Faslodex and Xgeva dates accordingly (4 weeks apart) -CT chest/abd/pelvis in 5 weeks with labs -RTC with dr KIrene Limboin 6 weeks    All of the patients questions were answered with apparent satisfaction. The patient knows to call the clinic with any problems, questions or concerns.   The total time spent in the appt was 35 minutes and more than 50% was on counseling and direct patient cares.    GSullivan LoneMD MKistlerAAHIVMS SEndocentre At Quarterfield StationCAshley County Medical CenterHematology/Oncology Physician CSur[facility] (Office):       [phone](Work cell):  [phone](Fax):           [phone] I, SBaldwin Jamaica am acting as a scribe for Dr. GSullivan Lone   .I have reviewed the above documentation for accuracy and completeness, and I agree with the above. .Brunetta GeneraMD

## 2018-10-31 ENCOUNTER — Inpatient Hospital Stay: Payer: BC Managed Care – PPO

## 2018-10-31 ENCOUNTER — Inpatient Hospital Stay (HOSPITAL_BASED_OUTPATIENT_CLINIC_OR_DEPARTMENT_OTHER): Payer: BC Managed Care – PPO | Admitting: Hematology

## 2018-10-31 VITALS — BP 113/87 | HR 80 | Temp 98.1°F | Resp 18 | Ht 65.0 in | Wt 165.0 lb

## 2018-10-31 DIAGNOSIS — C7951 Secondary malignant neoplasm of bone: Secondary | ICD-10-CM | POA: Diagnosis not present

## 2018-10-31 DIAGNOSIS — C50812 Malignant neoplasm of overlapping sites of left female breast: Secondary | ICD-10-CM

## 2018-10-31 DIAGNOSIS — C50919 Malignant neoplasm of unspecified site of unspecified female breast: Secondary | ICD-10-CM

## 2018-10-31 DIAGNOSIS — Z5111 Encounter for antineoplastic chemotherapy: Secondary | ICD-10-CM | POA: Diagnosis not present

## 2018-10-31 DIAGNOSIS — E559 Vitamin D deficiency, unspecified: Secondary | ICD-10-CM

## 2018-10-31 DIAGNOSIS — G893 Neoplasm related pain (acute) (chronic): Secondary | ICD-10-CM

## 2018-10-31 DIAGNOSIS — Z7189 Other specified counseling: Secondary | ICD-10-CM

## 2018-10-31 DIAGNOSIS — C787 Secondary malignant neoplasm of liver and intrahepatic bile duct: Secondary | ICD-10-CM

## 2018-10-31 DIAGNOSIS — C7931 Secondary malignant neoplasm of brain: Secondary | ICD-10-CM

## 2018-10-31 DIAGNOSIS — C7800 Secondary malignant neoplasm of unspecified lung: Secondary | ICD-10-CM | POA: Diagnosis not present

## 2018-10-31 DIAGNOSIS — Z170 Estrogen receptor positive status [ER+]: Secondary | ICD-10-CM

## 2018-10-31 DIAGNOSIS — M7989 Other specified soft tissue disorders: Secondary | ICD-10-CM

## 2018-10-31 LAB — CMP (CANCER CENTER ONLY)
ALT: 46 U/L — ABNORMAL HIGH (ref 0–44)
AST: 45 U/L — ABNORMAL HIGH (ref 15–41)
Albumin: 2.9 g/dL — ABNORMAL LOW (ref 3.5–5.0)
Alkaline Phosphatase: 236 U/L — ABNORMAL HIGH (ref 38–126)
Anion gap: 9 (ref 5–15)
BUN: 12 mg/dL (ref 8–23)
CO2: 25 mmol/L (ref 22–32)
Calcium: 9 mg/dL (ref 8.9–10.3)
Chloride: 108 mmol/L (ref 98–111)
Creatinine: 0.66 mg/dL (ref 0.44–1.00)
GFR, Estimated: >60 mL/min (ref >60)
Glucose, Bld: 80 mg/dL (ref 70–99)
Potassium: 3.7 mmol/L (ref 3.5–5.1)
Sodium: 142 mmol/L (ref 135–145)
Total Bilirubin: 0.4 mg/dL (ref 0.3–1.2)
Total Protein: 5.9 g/dL — ABNORMAL LOW (ref 6.5–8.1)

## 2018-10-31 LAB — CBC WITH DIFFERENTIAL/PLATELET
Abs Immature Granulocytes: 0.36 10*3/uL — ABNORMAL HIGH (ref 0.00–0.07)
Basophils Absolute: 0.1 10*3/uL (ref 0.0–0.1)
Basophils Relative: 0 %
EOS ABS: 0 10*3/uL (ref 0.0–0.5)
EOS PCT: 0 %
HCT: 33 % — ABNORMAL LOW (ref 36.0–46.0)
Hemoglobin: 10.4 g/dL — ABNORMAL LOW (ref 12.0–15.0)
Immature Granulocytes: 3 %
Lymphocytes Relative: 5 %
Lymphs Abs: 0.7 10*3/uL (ref 0.7–4.0)
MCH: 32.3 pg (ref 26.0–34.0)
MCHC: 31.5 g/dL (ref 30.0–36.0)
MCV: 102.5 fL — ABNORMAL HIGH (ref 80.0–100.0)
MONO ABS: 1.7 10*3/uL — AB (ref 0.1–1.0)
Monocytes Relative: 14 %
Neutro Abs: 9.4 10*3/uL — ABNORMAL HIGH (ref 1.7–7.7)
Neutrophils Relative %: 78 %
Platelets: 192 10*3/uL (ref 150–400)
RBC: 3.22 MIL/uL — ABNORMAL LOW (ref 3.87–5.11)
RDW: 17.4 % — AB (ref 11.5–15.5)
WBC: 12.2 10*3/uL — ABNORMAL HIGH (ref 4.0–10.5)
nRBC: 2.1 % — ABNORMAL HIGH (ref 0.0–0.2)

## 2018-10-31 LAB — MAGNESIUM: Magnesium: 2.1 mg/dL (ref 1.7–2.4)

## 2018-10-31 LAB — PHOSPHORUS: Phosphorus: 3.2 mg/dL (ref 2.5–4.6)

## 2018-10-31 MED ORDER — PALONOSETRON HCL INJECTION 0.25 MG/5ML
INTRAVENOUS | Status: AC
  Filled 2018-10-31: qty 5

## 2018-10-31 MED ORDER — SODIUM CHLORIDE 0.9 % IV SOLN
Freq: Once | INTRAVENOUS | Status: AC
  Administered 2018-10-31: 14:00:00 via INTRAVENOUS
  Filled 2018-10-31: qty 5

## 2018-10-31 MED ORDER — HEPARIN SOD (PORK) LOCK FLUSH 100 UNIT/ML IV SOLN
500.0000 [IU] | Freq: Once | INTRAVENOUS | Status: AC | PRN
  Administered 2018-10-31: 500 [IU]
  Filled 2018-10-31: qty 5

## 2018-10-31 MED ORDER — SODIUM CHLORIDE 0.9 % IV SOLN
Freq: Once | INTRAVENOUS | Status: AC
  Administered 2018-10-31: 14:00:00 via INTRAVENOUS
  Filled 2018-10-31: qty 250

## 2018-10-31 MED ORDER — DOXORUBICIN HCL CHEMO IV INJECTION 2 MG/ML
50.0000 mg/m2 | Freq: Once | INTRAVENOUS | Status: AC
  Administered 2018-10-31: 90 mg via INTRAVENOUS
  Filled 2018-10-31: qty 45

## 2018-10-31 MED ORDER — SODIUM CHLORIDE 0.9 % IV SOLN
500.0000 mg/m2 | Freq: Once | INTRAVENOUS | Status: AC
  Administered 2018-10-31: 900 mg via INTRAVENOUS
  Filled 2018-10-31: qty 45

## 2018-10-31 MED ORDER — SODIUM CHLORIDE 0.9% FLUSH
10.0000 mL | INTRAVENOUS | Status: DC | PRN
  Filled 2018-10-31: qty 10

## 2018-10-31 MED ORDER — PALONOSETRON HCL INJECTION 0.25 MG/5ML
0.2500 mg | Freq: Once | INTRAVENOUS | Status: AC
  Administered 2018-10-31: 0.25 mg via INTRAVENOUS

## 2018-10-31 NOTE — Patient Instructions (Signed)
[place] Discharge Instructions for Patients Receiving Chemotherapy  Today you received the following chemotherapy agents: Doxorubicin (Adriamycin) and Cyclophosphamide (Cytoxan)  To help prevent nausea and vomiting after your treatment, we encourage you to take your nausea medication as directed.    If you develop nausea and vomiting that is not controlled by your nausea medication, call the clinic.   BELOW ARE SYMPTOMS THAT SHOULD BE REPORTED IMMEDIATELY:  *FEVER GREATER THAN 100.5 F  *CHILLS WITH OR WITHOUT FEVER  NAUSEA AND VOMITING THAT IS NOT CONTROLLED WITH YOUR NAUSEA MEDICATION  *UNUSUAL SHORTNESS OF BREATH  *UNUSUAL BRUISING OR BLEEDING  TENDERNESS IN MOUTH AND THROAT WITH OR WITHOUT PRESENCE OF ULCERS  *URINARY PROBLEMS  *BOWEL PROBLEMS  UNUSUAL RASH Items with * indicate a potential emergency and should be followed up as soon as possible.  Feel free to call the clinic should you have any questions or concerns. The clinic phone number is [phone].  Please show the [place] at check-in to the Emergency Department and triage nurse.

## 2018-11-01 ENCOUNTER — Inpatient Hospital Stay: Payer: BC Managed Care – PPO

## 2018-11-01 VITALS — BP 99/71 | HR 104 | Temp 98.8°F

## 2018-11-01 DIAGNOSIS — C50919 Malignant neoplasm of unspecified site of unspecified female breast: Secondary | ICD-10-CM

## 2018-11-01 DIAGNOSIS — C787 Secondary malignant neoplasm of liver and intrahepatic bile duct: Secondary | ICD-10-CM

## 2018-11-01 DIAGNOSIS — Z7189 Other specified counseling: Secondary | ICD-10-CM

## 2018-11-01 DIAGNOSIS — Z5111 Encounter for antineoplastic chemotherapy: Secondary | ICD-10-CM | POA: Diagnosis not present

## 2018-11-01 LAB — CANCER ANTIGEN 15-3: CA 15-3: 57.3 U/mL — ABNORMAL HIGH (ref 0.0–25.0)

## 2018-11-01 LAB — CANCER ANTIGEN 27.29: CA 27.29: 73.1 U/mL — ABNORMAL HIGH (ref 0.0–38.6)

## 2018-11-01 MED ORDER — PEGFILGRASTIM-CBQV 6 MG/0.6ML ~~LOC~~ SOSY
PREFILLED_SYRINGE | SUBCUTANEOUS | Status: AC
  Filled 2018-11-01: qty 0.6

## 2018-11-01 MED ORDER — PEGFILGRASTIM-CBQV 6 MG/0.6ML ~~LOC~~ SOSY
6.0000 mg | PREFILLED_SYRINGE | Freq: Once | SUBCUTANEOUS | Status: AC
  Administered 2018-11-01: 6 mg via SUBCUTANEOUS

## 2018-11-01 MED ORDER — CHLORHEXIDINE GLUCONATE 0.12 % MT SOLN: 15.0000 mL | Freq: Two times a day (BID) | OROMUCOSAL | 1 refills | Status: DC | PRN

## 2018-11-01 NOTE — Patient Instructions (Signed)
Pegfilgrastim injection What is this medicine? PEGFILGRASTIM (PEG fil gra stim) is a long-acting granulocyte colony-stimulating factor that stimulates the growth of neutrophils, a type of white blood cell important in the body's fight against infection. It is used to reduce the incidence of fever and infection in patients with certain types of cancer who are receiving chemotherapy that affects the bone marrow, and to increase survival after being exposed to high doses of radiation. This medicine may be used for other purposes; ask your health care provider or pharmacist if you have questions. COMMON BRAND NAME(S): Neulasta,Udenyca  What should I tell my health care provider before I take this medicine? They need to know if you have any of these conditions: -kidney disease -latex allergy -ongoing radiation therapy -sickle cell disease -skin reactions to acrylic adhesives (On-Body Injector only) -an unusual or allergic reaction to pegfilgrastim, filgrastim, other medicines, foods, dyes, or preservatives -pregnant or trying to get pregnant -breast-feeding How should I use this medicine? This medicine is for injection under the skin. If you get this medicine at home, you will be taught how to prepare and give the pre-filled syringe or how to use the On-body Injector. Refer to the patient Instructions for Use for detailed instructions. Use exactly as directed. Tell your healthcare provider immediately if you suspect that the On-body Injector may not have performed as intended or if you suspect the use of the On-body Injector resulted in a missed or partial dose. It is important that you put your used needles and syringes in a special sharps container. Do not put them in a trash can. If you do not have a sharps container, call your pharmacist or healthcare provider to get one. Talk to your pediatrician regarding the use of this medicine in children. While this drug may be prescribed for selected  conditions, precautions do apply. Overdosage: If you think you have taken too much of this medicine contact a poison control center or emergency room at once. NOTE: This medicine is only for you. Do not share this medicine with others. What if I miss a dose? It is important not to miss your dose. Call your doctor or health care professional if you miss your dose. If you miss a dose due to an On-body Injector failure or leakage, a new dose should be administered as soon as possible using a single prefilled syringe for manual use. What may interact with this medicine? Interactions have not been studied. Give your health care provider a list of all the medicines, herbs, non-prescription drugs, or dietary supplements you use. Also tell them if you smoke, drink alcohol, or use illegal drugs. Some items may interact with your medicine. This list may not describe all possible interactions. Give your health care provider a list of all the medicines, herbs, non-prescription drugs, or dietary supplements you use. Also tell them if you smoke, drink alcohol, or use illegal drugs. Some items may interact with your medicine. What should I watch for while using this medicine? You may need blood work done while you are taking this medicine. If you are going to need a MRI, CT scan, or other procedure, tell your doctor that you are using this medicine (On-Body Injector only). What side effects may I notice from receiving this medicine? Side effects that you should report to your doctor or health care professional as soon as possible: -allergic reactions like skin rash, itching or hives, swelling of the face, lips, or tongue -dizziness -fever -pain, redness, or irritation at   site where injected -pinpoint red spots on the skin -red or dark-brown urine -shortness of breath or breathing problems -stomach or side pain, or pain at the shoulder -swelling -tiredness -trouble passing urine or change in the amount of  urine Side effects that usually do not require medical attention (report to your doctor or health care professional if they continue or are bothersome): -bone pain -muscle pain This list may not describe all possible side effects. Call your doctor for medical advice about side effects. You may report side effects to FDA at 1-800-FDA-1088. Where should I keep my medicine? Keep out of the reach of children. Store pre-filled syringes in a refrigerator between 2 and 8 degrees C (36 and 46 degrees F). Do not freeze. Keep in carton to protect from light. Throw away this medicine if it is left out of the refrigerator for more than 48 hours. Throw away any unused medicine after the expiration date. NOTE: This sheet is a summary. It may not cover all possible information. If you have questions about this medicine, talk to your doctor, pharmacist, or health care provider.  2018 Elsevier/Gold Standard (2016-11-04 12:58:03)  

## 2018-11-02 ENCOUNTER — Ambulatory Visit: Payer: Self-pay

## 2018-11-02 NOTE — Progress Notes (Signed)
Brain and Spine Tumor Board Documentation  Alleta Avery was presented by Cecil Cobbs, MD at Brain and Spine Tumor Board on 11/02/2018, which included representatives from neuro oncology, radiation oncology, surgical oncology, navigation, pathology, radiology, genetics.  Yvone was presented as a current patient with history of the following treatments:  .  Additionally, we reviewed previous medical and familial history, history of present illness, and recent lab results along with all available histopathologic and imaging studies. The tumor board considered available treatment options and made the following recommendations:  Active surveillance    Tumor board is a meeting of clinicians from various specialty areas who evaluate and discuss patients for whom a multidisciplinary approach is being considered. Final determinations in the plan of care are those of the provider(s). The responsibility for follow up of recommendations given during tumor board is that of the provider.   Today's extended care, comprehensive team conference, Ayame was not present for the discussion and was not examined.

## 2018-11-03 ENCOUNTER — Other Ambulatory Visit: Payer: Self-pay

## 2018-11-07 ENCOUNTER — Encounter: Payer: Self-pay | Admitting: Internal Medicine

## 2018-11-07 ENCOUNTER — Other Ambulatory Visit: Payer: Self-pay | Admitting: Hematology

## 2018-11-07 ENCOUNTER — Inpatient Hospital Stay (HOSPITAL_BASED_OUTPATIENT_CLINIC_OR_DEPARTMENT_OTHER): Payer: BC Managed Care – PPO | Admitting: Internal Medicine

## 2018-11-07 VITALS — BP 95/67 | HR 117 | Temp 98.6°F | Resp 20 | Ht 65.0 in | Wt 169.4 lb

## 2018-11-07 DIAGNOSIS — C50812 Malignant neoplasm of overlapping sites of left female breast: Secondary | ICD-10-CM | POA: Diagnosis not present

## 2018-11-07 DIAGNOSIS — Z170 Estrogen receptor positive status [ER+]: Secondary | ICD-10-CM

## 2018-11-07 DIAGNOSIS — R21 Rash and other nonspecific skin eruption: Secondary | ICD-10-CM

## 2018-11-07 DIAGNOSIS — C7931 Secondary malignant neoplasm of brain: Secondary | ICD-10-CM | POA: Diagnosis not present

## 2018-11-07 DIAGNOSIS — R569 Unspecified convulsions: Secondary | ICD-10-CM

## 2018-11-07 DIAGNOSIS — Z5111 Encounter for antineoplastic chemotherapy: Secondary | ICD-10-CM | POA: Diagnosis not present

## 2018-11-07 MED ORDER — LORAZEPAM 0.5 MG PO TABS: 0.5000 mg | ORAL_TABLET | Freq: Three times a day (TID) | ORAL | 0 refills | Status: DC | PRN

## 2018-11-07 MED ORDER — DEXAMETHASONE 2 MG PO TABS: 1.0000 mg | ORAL_TABLET | Freq: Every day | ORAL | 2 refills | Status: DC

## 2018-11-07 NOTE — Progress Notes (Signed)
[facility] [place], [place] [address] [phone]   Interval Evaluation  Date of Service: 11/07/18 Patient Name: Tyona Nilsen Patient MRN: [id number] Patient DOB: [birth year] Provider: Ventura Sellers, MD  Identifying Statement:  Chelan Heringer is a 62 y.o. female with Brain metastases [facility]) [C79.31]   Oncologic History: Oncology History   Declined genetic testing     Metastatic breast cancer ([place])   01/21/2017 Initial Diagnosis    Metastatic breast cancer ([place])    07/13/2018 -  Chemotherapy    The patient had DOXOrubicin (ADRIAMYCIN) chemo injection 36 mg, 20 mg/m2 = 36 mg (66.7 % of original dose 30 mg/m2), Intravenous,  Once, 6 of 6 cycles Dose modification: 30 mg/m2 (original dose 30 mg/m2, Cycle 1, Reason: Change in LFTs), 20 mg/m2 (original dose 30 mg/m2, Cycle 1, Reason: Change in LFTs), 30 mg/m2 (original dose 30 mg/m2, Cycle 3, Reason: Change in LFTs), 50 mg/m2 (original dose 30 mg/m2, Cycle 4, Reason: Provider Judgment) Administration: 36 mg (07/19/2018), 36 mg (08/09/2018), 54 mg (08/30/2018), 90 mg (09/20/2018), 90 mg (10/10/2018), 90 mg (10/31/2018) palonosetron (ALOXI) injection 0.25 mg, 0.25 mg, Intravenous,  Once, 6 of 6 cycles Administration: 0.25 mg (07/19/2018), 0.25 mg (08/09/2018), 0.25 mg (08/30/2018), 0.25 mg (09/20/2018), 0.25 mg (10/10/2018), 0.25 mg (10/31/2018) pegfilgrastim-cbqv (UDENYCA) injection 6 mg, 6 mg, Subcutaneous, Once, 6 of 6 cycles Administration: 6 mg (08/11/2018), 6 mg (09/01/2018), 6 mg (09/22/2018), 6 mg (10/12/2018), 6 mg (11/01/2018) cyclophosphamide (CYTOXAN) 720 mg in sodium chloride 0.9 % 250 mL chemo infusion, 400 mg/m2 = 720 mg (100 % of original dose 400 mg/m2), Intravenous,  Once, 6 of 6 cycles Dose modification: 400 mg/m2 (original dose 400 mg/m2, Cycle 1, Reason: Change in LFTs), 500 mg/m2 (original dose 400 mg/m2, Cycle 3, Reason: Change in LFTs) Administration: 720 mg (07/19/2018),  720 mg (08/09/2018), 900 mg (08/30/2018), 900 mg (09/20/2018), 900 mg (10/10/2018), 900 mg (10/31/2018) fosaprepitant (EMEND) 150 mg, dexamethasone (DECADRON) 12 mg in sodium chloride 0.9 % 145 mL IVPB, , Intravenous,  Once, 6 of 6 cycles Administration:  (07/19/2018),  (08/09/2018),  (08/30/2018),  (09/20/2018),  (10/10/2018),  (10/31/2018)  for chemotherapy treatment.      Liver metastases ([place])   07/12/2018 Initial Diagnosis    Liver metastases ([place])    07/13/2018 -  Chemotherapy    The patient had DOXOrubicin (ADRIAMYCIN) chemo injection 36 mg, 20 mg/m2 = 36 mg (66.7 % of original dose 30 mg/m2), Intravenous,  Once, 6 of 6 cycles Dose modification: 30 mg/m2 (original dose 30 mg/m2, Cycle 1, Reason: Change in LFTs), 20 mg/m2 (original dose 30 mg/m2, Cycle 1, Reason: Change in LFTs), 30 mg/m2 (original dose 30 mg/m2, Cycle 3, Reason: Change in LFTs), 50 mg/m2 (original dose 30 mg/m2, Cycle 4, Reason: Provider Judgment) Administration: 36 mg (07/19/2018), 36 mg (08/09/2018), 54 mg (08/30/2018), 90 mg (09/20/2018), 90 mg (10/10/2018), 90 mg (10/31/2018) palonosetron (ALOXI) injection 0.25 mg, 0.25 mg, Intravenous,  Once, 6 of 6 cycles Administration: 0.25 mg (07/19/2018), 0.25 mg (08/09/2018), 0.25 mg (08/30/2018), 0.25 mg (09/20/2018), 0.25 mg (10/10/2018), 0.25 mg (10/31/2018) pegfilgrastim-cbqv (UDENYCA) injection 6 mg, 6 mg, Subcutaneous, Once, 6 of 6 cycles Administration: 6 mg (08/11/2018), 6 mg (09/01/2018), 6 mg (09/22/2018), 6 mg (10/12/2018), 6 mg (11/01/2018) cyclophosphamide (CYTOXAN) 720 mg in sodium chloride 0.9 % 250 mL chemo infusion, 400 mg/m2 = 720 mg (100 % of original dose 400 mg/m2), Intravenous,  Once, 6 of 6 cycles Dose modification: 400 mg/m2 (original  dose 400 mg/m2, Cycle 1, Reason: Change in LFTs), 500 mg/m2 (original dose 400 mg/m2, Cycle 3, Reason: Change in LFTs) Administration: 720 mg (07/19/2018), 720 mg (08/09/2018), 900 mg (08/30/2018), 900 mg (09/20/2018), 900 mg (10/10/2018),  900 mg (10/31/2018) fosaprepitant (EMEND) 150 mg, dexamethasone (DECADRON) 12 mg in sodium chloride 0.9 % 145 mL IVPB, , Intravenous,  Once, 6 of 6 cycles Administration:  (07/19/2018),  (08/09/2018),  (08/30/2018),  (09/20/2018),  (10/10/2018),  (10/31/2018)  for chemotherapy treatment.      Interval History: Ms. Crites presents today for follow up after recent MRI brain.  She describes no new or progressive neurologic deficits.  No further seizures or recent headaches.  She continues to take decadron 2mg  daily.  Rash on back of legs has re-appeared, previously associated with chemotherapy.  (H+P) 08/01/18:  [name] presented last week with two witnessed episodes of generalized shaking, eyes open, consistent with generalized seizure.  She had just two days prior been dosed with first cycle of doxorubicin and cyclophosphamide for progressive hormone positive breast cancer.  She denied any neurologic symptoms prior to the seizures, and after a day inpatient was back to her baseline.  MRI did demonstrate a metastatic focus with an enhancing dural tail.  She was started on decadron 4mg  daily and had repeat MRI few days ago.  At this time she has no neurologic complaints.    Medications: Current Outpatient Medications on File Prior to Visit  Medication Sig Dispense Refill  . aspirin EC 81 MG tablet Take 1 tablet (81 mg total) by mouth daily.    . chlorhexidine (PERIDEX) 0.12 % solution Use as directed 15 mLs in the mouth or throat 2 (two) times daily as needed. 473 mL 1  . clobetasol cream (TEMOVATE) 7.12 % Apply 1 application topically 2 (two) times daily. 30 g 0  . dexamethasone (DECADRON) 2 MG tablet Take 1 tablet (2 mg total) by mouth daily. (Patient not taking: Reported on 10/10/2018) 30 tablet 2  . Emollient (AQUAPHOR EX) Apply topically. 2 x day to lower legs    . escitalopram (LEXAPRO) 20 MG tablet TAKE 1 TABLET(20 MG) BY MOUTH DAILY 30 tablet 0  . feeding supplement, ENSURE ENLIVE,  (ENSURE ENLIVE) LIQD Take 237 mLs by mouth 2 (two) times daily between meals. (Patient taking differently: Take 237-474 mLs by mouth 2 (two) times daily between meals. ) 237 mL 12  . furosemide (LASIX) 20 MG tablet Take 1 tablet (20 mg total) by mouth daily. 5 tablet 0  . levETIRAcetam (KEPPRA) 750 MG tablet Take 1 tablet (750 mg total) by mouth 2 (two) times daily. 60 tablet 3  . lidocaine-prilocaine (EMLA) cream Apply to affected area once 30 g 3  . loperamide (IMODIUM) 1 MG/5ML solution Take 2 mg by mouth as needed for diarrhea or loose stools.     . nystatin (MYCOSTATIN) 100000 UNIT/ML suspension Take 5 mLs (500,000 Units total) by mouth 4 (four) times daily. 473 mL 0  . pantoprazole (PROTONIX) 40 MG tablet TAKE 1 TABLET(40 MG) BY MOUTH DAILY 30 tablet 1  . potassium chloride (K-DUR) 10 MEQ tablet Take 1 tablet (10 mEq total) by mouth daily. (Patient not taking: Reported on 10/10/2018) 5 tablet 0  . potassium chloride SA (K-DUR,KLOR-CON) 20 MEQ tablet TAKE 1 TABLET(20 MEQ) BY MOUTH TWICE DAILY 30 tablet 0  . Vitamin D, Ergocalciferol, (DRISDOL) 50000 units CAPS capsule TAKE 1 CAPSULE BY MOUTH 3 TIMES WEEKLY (Patient taking differently: Take 50,000 Units by mouth. TAKE 1  CAPSULE BY MOUTH 3 TIMES WEEKLY) 24 capsule 0   No current facility-administered medications on file prior to visit.     Allergies:  Allergies  Allergen Reactions  . Thorazine [Chlorpromazine] Anaphylaxis  . Ribociclib     Exfoliative dermatitis   Past Medical History:  Past Medical History:  Diagnosis Date  . Cancer [facility])    Metastatic Breast Cancer   Past Surgical History:  Past Surgical History:  Procedure Laterality Date  . CHOLECYSTECTOMY    . IR IMAGING GUIDED PORT INSERTION  07/14/2018  . TONSILLECTOMY     Social History:  Social History   Socioeconomic History  . Marital status: Single    Spouse name: Not on file  . Number of children: Not on file  . Years of education: Not on file  . Highest  education level: Not on file  Occupational History  . Not on file  Social Needs  . Financial resource strain: Not on file  . Food insecurity:    Worry: Not on file    Inability: Not on file  . Transportation needs:    Medical: Not on file    Non-medical: Not on file  Tobacco Use  . Smoking status: Current Every Day Smoker    Packs/day: 0.15    Types: Cigarettes    Last attempt to quit: 01/21/2017    Years since quitting: 1.7  . Smokeless tobacco: Never Used  Substance and Sexual Activity  . Alcohol use: No    Alcohol/week: 0.0 standard drinks  . Drug use: No  . Sexual activity: Never  Lifestyle  . Physical activity:    Days per week: Not on file    Minutes per session: Not on file  . Stress: Not on file  Relationships  . Social connections:    Talks on phone: Not on file    Gets together: Not on file    Attends religious service: Not on file    Active member of club or organization: Not on file    Attends meetings of clubs or organizations: Not on file    Relationship status: Not on file  . Intimate partner violence:    Fear of current or ex partner: No    Emotionally abused: No    Physically abused: No    Forced sexual activity: No  Other Topics Concern  . Not on file  Social History Narrative   Lives alone. 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 Has phobia of the medical system.   Family History:  Family History  Problem Relation Age of Onset  . Cancer Mother        breast  . Cancer Sister        breast    Review of Systems: Constitutional: Denies fevers, chills or abnormal weight loss Eyes: Denies blurriness of vision Ears, nose, mouth, throat, and face: Denies mucositis or sore throat Respiratory: Denies cough, dyspnea or wheezes Cardiovascular: Denies palpitation, chest discomfort or lower extremity swelling Gastrointestinal:  Denies nausea, constipation, diarrhea GU: Denies dysuria or incontinence Skin: Denies abnormal skin rashes Neurological: Per  HPI Musculoskeletal: Denies joint pain, back or neck discomfort. No decrease in ROM Behavioral/Psych: Denies anxiety, disturbance in thought content, and mood instability   Physical Exam: Vitals:   11/07/18 1053  BP: 95/67  Pulse: (!) 117  Resp: 20  Temp: 98.6 F (37 C)  SpO2: 100%   KPS: 90. General: Alert, cooperative, pleasant, in no acute distress Head: Craniotomy scar noted, dry and intact. EENT: No conjunctival  injection or scleral icterus. Oral mucosa moist Lungs: Resp effort normal Cardiac: Regular rate and rhythm Abdomen: Soft, non-distended abdomen Skin: Desquamative rash on posterior lower legs Extremities: No clubbing or edema  Neurologic Exam: Mental Status: Awake, alert, attentive to examiner. Oriented to self and environment. Language is fluent with intact comprehension.  Cranial Nerves: Visual acuity is grossly normal. Visual fields are full. Extra-ocular movements intact. No ptosis. Face is symmetric, tongue midline. Motor: Tone and bulk are normal. Power is full in both arms and legs. Reflexes are symmetric, no pathologic reflexes present. Intact finger to nose bilaterally Sensory: Intact to light touch and temperature Gait: Normal and tandem gait is normal.   Labs: I have reviewed the data as listed    Component Value Date/Time   NA 142 10/31/2018 1121   NA 139 11/24/2017 1340   K 3.7 10/31/2018 1121   K 3.7 11/24/2017 1340   CL 108 10/31/2018 1121   CO2 25 10/31/2018 1121   CO2 25 11/24/2017 1340   GLUCOSE 80 10/31/2018 1121   GLUCOSE 83 11/24/2017 1340   BUN 12 10/31/2018 1121   BUN 9.7 11/24/2017 1340   CREATININE 0.66 10/31/2018 1121   CREATININE 1.2 (H) 11/24/2017 1340   CALCIUM 9.0 10/31/2018 1121   CALCIUM 9.3 11/24/2017 1340   PROT 5.9 (L) 10/31/2018 1121   PROT 7.1 11/24/2017 1340   ALBUMIN 2.9 (L) 10/31/2018 1121   ALBUMIN 3.8 11/24/2017 1340   AST 45 (H) 10/31/2018 1121   AST 29 11/24/2017 1340   ALT 46 (H) 10/31/2018 1121   ALT  35 11/24/2017 1340   ALKPHOS 236 (H) 10/31/2018 1121   ALKPHOS 73 11/24/2017 1340   BILITOT 0.4 10/31/2018 1121   BILITOT 0.40 11/24/2017 1340   GFRNONAA >60 10/31/2018 1121   GFRAA >60 10/31/2018 1121   Lab Results  Component Value Date   WBC 12.2 (H) 10/31/2018   NEUTROABS 9.4 (H) 10/31/2018   HGB 10.4 (L) 10/31/2018   HCT 33.0 (L) 10/31/2018   MCV 102.5 (H) 10/31/2018   PLT 192 10/31/2018    Imaging:  [place] Clinician Interpretation: I have personally reviewed the CNS images as listed.  My interpretation, in the context of the patient's clinical presentation, is stable disease  Mr Jeri Cos Wo Contrast  Result Date: 10/23/2018 CLINICAL DATA:  Restaging of metastatic breast cancer. Status post 5 brain chemo radiation treatment since October 2019. Blurry vision. EXAM: MRI HEAD WITHOUT AND WITH CONTRAST TECHNIQUE: Multiplanar, multiecho pulse sequences of the brain and surrounding structures were obtained without and with intravenous contrast. CONTRAST:  27mL MULTIHANCE GADOBENATE DIMEGLUMINE 529 MG/ML IV SOLN COMPARISON:  None. FINDINGS: BRAIN New Lesions: None. Larger lesions: None. Stable or Smaller lesions: LEFT frontal dural-based 7 x 23 x 30 mm metastasis was 27 x 28 x 49 mm. Decreased associated smooth dural thickening and decreased parenchymal invasion. Other: INTRACRANIAL CONTENTS: Markedly improved, mild residual LEFT frontal vasogenic edema. No midline shift. Re-expanded LEFT lateral ventricle. No reduced diffusion to suggest acute ischemia. No susceptibility artifact to suggest hemorrhage, minimal susceptibility artifact associated with LEFT dural metastasis. Bilateral inferior cerebellar atrophy, unchanged. No hydrocephalus. A few scattered subcentimeter supratentorial white matter FLAIR T2 hyperintensities exclusive aforementioned abnormality compatible with mild chronic small vessel ischemic changes. No abnormal extra-axial fluid collections. Stable appearance of low T2,  homogeneously enhancing 12 mm RIGHT petrous apex meningioma with regional mass effect. Stable appearance of 6 mm prepontine meningioma. VASCULAR: Normal major intracranial vascular flow voids present at skull base.  SKULL AND UPPER CERVICAL SPINE: No abnormal sellar expansion. Diffuse osseous metastasis with heterogeneous enhancement. Craniocervical junction maintained. SINUSES/ORBITS: Mild LEFT maxillary sinus mucosal thickening with small mucosal retention cyst. Included ocular globes and orbital contents are non-suspicious. OTHER: None. IMPRESSION: 1. SRS protocol demonstrating treatment response: Regression of LEFT frontal dural metastasis, decreased vasogenic edema and parenchymal invasion. Similar calvarial metastasis. 2. Stable appearance of 2 small posterior fossa meningiomas. 3. No acute intracranial process. Electronically Signed   By: Elon Alas M.D.   On: 10/23/2018 23:48     Assessment/Plan 1. Brain metastases ([place])  2. Seizures ([place])  Ms. Piasecki is clinically and radiographically stable today, now 3 months following SRS.  MRI demonstrates encouraging response including dural component.    We recommended she decreased decadron to 1mg  daily for 2 weeks, then stop.  If difficulty with wean, she will call us and we will adjust dose over the phone.  Should continue Keppra 750mg  BID.    We appreciate the opportunity to participate in the care of Jamieson Lisa.  She should return to clinic 3 months with MRI for review.  All questions were answered. The patient knows to call the clinic with any problems, questions or concerns. No barriers to learning were detected.  The total time spent in the encounter was 25 minutes and more than 50% was on counseling and review of test results   Ventura Sellers, MD Medical Director of Neuro-Oncology [place] [facility] 11/07/18 10:47 AM

## 2018-11-08 ENCOUNTER — Telehealth: Payer: Self-pay

## 2018-11-08 NOTE — Telephone Encounter (Signed)
Spoke with patient concerning her upcoming appointment. Per 12/17 los. Mailed a letter with a calender enclosed

## 2018-11-09 ENCOUNTER — Other Ambulatory Visit: Payer: Self-pay | Admitting: Hematology

## 2018-11-09 NOTE — Progress Notes (Signed)
Disability successfully faxed to Shane Crutch at [phone]. Mailed copy to patient address on file.

## 2018-11-10 ENCOUNTER — Telehealth: Payer: Self-pay | Admitting: *Deleted

## 2018-11-10 NOTE — Telephone Encounter (Signed)
Called, left VM: Blisters on right leg are better. Blisters on left leg are beginning to burn, even using medicine - is that to be expected?  Also, please refill potassium. Per MD plan on 10/31/18: Continue 10MEQ Potassium PO BID

## 2018-11-10 NOTE — Telephone Encounter (Signed)
Per Dr. Irene Limbo: Potassium refilled - 20 meq once daily. Continue treating left leg with current regime and bserve left leg for changes - if worsens, contact office. Patient verbalized understanding.

## 2018-11-13 ENCOUNTER — Other Ambulatory Visit: Payer: Self-pay | Admitting: *Deleted

## 2018-11-13 DIAGNOSIS — C7931 Secondary malignant neoplasm of brain: Secondary | ICD-10-CM

## 2018-11-17 ENCOUNTER — Ambulatory Visit: Payer: BC Managed Care – PPO

## 2018-11-24 ENCOUNTER — Other Ambulatory Visit: Payer: Self-pay | Admitting: Hematology

## 2018-11-26 ENCOUNTER — Other Ambulatory Visit: Payer: Self-pay | Admitting: Hematology

## 2018-11-27 ENCOUNTER — Inpatient Hospital Stay: Payer: BC Managed Care – PPO | Attending: Hematology

## 2018-11-27 VITALS — BP 102/68 | HR 108 | Resp 18

## 2018-11-27 DIAGNOSIS — C7951 Secondary malignant neoplasm of bone: Secondary | ICD-10-CM

## 2018-11-27 DIAGNOSIS — Z5111 Encounter for antineoplastic chemotherapy: Secondary | ICD-10-CM | POA: Diagnosis not present

## 2018-11-27 DIAGNOSIS — C50912 Malignant neoplasm of unspecified site of left female breast: Secondary | ICD-10-CM | POA: Diagnosis present

## 2018-11-27 DIAGNOSIS — Z7189 Other specified counseling: Secondary | ICD-10-CM

## 2018-11-27 DIAGNOSIS — Z95828 Presence of other vascular implants and grafts: Secondary | ICD-10-CM

## 2018-11-27 MED ORDER — FULVESTRANT 250 MG/5ML IM SOLN
500.0000 mg | Freq: Once | INTRAMUSCULAR | Status: AC
  Administered 2018-11-27: 500 mg via INTRAMUSCULAR

## 2018-11-27 MED ORDER — DENOSUMAB 120 MG/1.7ML ~~LOC~~ SOLN
120.0000 mg | Freq: Once | SUBCUTANEOUS | Status: AC
  Administered 2018-11-27: 120 mg via SUBCUTANEOUS

## 2018-11-27 MED ORDER — DENOSUMAB 120 MG/1.7ML ~~LOC~~ SOLN
SUBCUTANEOUS | Status: AC
  Filled 2018-11-27: qty 1.7

## 2018-11-27 MED ORDER — FULVESTRANT 250 MG/5ML IM SOLN
INTRAMUSCULAR | Status: AC
  Filled 2018-11-27: qty 10

## 2018-11-30 ENCOUNTER — Other Ambulatory Visit: Payer: Self-pay | Admitting: Hematology

## 2018-12-01 ENCOUNTER — Encounter (HOSPITAL_COMMUNITY): Payer: Self-pay

## 2018-12-01 ENCOUNTER — Inpatient Hospital Stay: Payer: BC Managed Care – PPO

## 2018-12-01 ENCOUNTER — Ambulatory Visit (HOSPITAL_COMMUNITY)
Admission: RE | Admit: 2018-12-01 | Discharge: 2018-12-01 | Disposition: A | Discharge status: Home / Self Care | Payer: BC Managed Care – PPO | Source: Ambulatory Visit | Attending: Hematology | Admitting: Hematology

## 2018-12-01 DIAGNOSIS — C50919 Malignant neoplasm of unspecified site of unspecified female breast: Secondary | ICD-10-CM

## 2018-12-01 DIAGNOSIS — Z5111 Encounter for antineoplastic chemotherapy: Secondary | ICD-10-CM | POA: Diagnosis not present

## 2018-12-01 LAB — CBC WITH DIFFERENTIAL/PLATELET
Abs Immature Granulocytes: 0.05 10*3/uL (ref 0.00–0.07)
BASOS ABS: 0.1 10*3/uL (ref 0.0–0.1)
Basophils Relative: 1 %
Eosinophils Absolute: 0.1 10*3/uL (ref 0.0–0.5)
Eosinophils Relative: 1 %
HCT: 37.1 % (ref 36.0–46.0)
Hemoglobin: 11.6 g/dL — ABNORMAL LOW (ref 12.0–15.0)
Immature Granulocytes: 1 %
LYMPHS PCT: 13 %
Lymphs Abs: 1 10*3/uL (ref 0.7–4.0)
MCH: 31.4 pg (ref 26.0–34.0)
MCHC: 31.3 g/dL (ref 30.0–36.0)
MCV: 100.3 fL — ABNORMAL HIGH (ref 80.0–100.0)
Monocytes Absolute: 0.9 10*3/uL (ref 0.1–1.0)
Monocytes Relative: 12 %
NEUTROS ABS: 5.2 10*3/uL (ref 1.7–7.7)
Neutrophils Relative %: 72 %
Platelets: 139 10*3/uL — ABNORMAL LOW (ref 150–400)
RBC: 3.7 MIL/uL — ABNORMAL LOW (ref 3.87–5.11)
RDW: 15.9 % — ABNORMAL HIGH (ref 11.5–15.5)
WBC: 7.2 10*3/uL (ref 4.0–10.5)
nRBC: 0 % (ref 0.0–0.2)

## 2018-12-01 LAB — CMP (CANCER CENTER ONLY)
ALT: 32 U/L (ref 0–44)
ANION GAP: 7 (ref 5–15)
AST: 32 U/L (ref 15–41)
Albumin: 2.7 g/dL — ABNORMAL LOW (ref 3.5–5.0)
Alkaline Phosphatase: 234 U/L — ABNORMAL HIGH (ref 38–126)
BUN: 8 mg/dL (ref 8–23)
CHLORIDE: 107 mmol/L (ref 98–111)
CO2: 26 mmol/L (ref 22–32)
Calcium: 8.8 mg/dL — ABNORMAL LOW (ref 8.9–10.3)
Creatinine: 0.67 mg/dL (ref 0.44–1.00)
GFR, Est AFR Am: >60 mL/min (ref >60)
GFR, Estimated: >60 mL/min (ref >60)
Glucose, Bld: 117 mg/dL — ABNORMAL HIGH (ref 70–99)
POTASSIUM: 3.9 mmol/L (ref 3.5–5.1)
Sodium: 140 mmol/L (ref 135–145)
Total Bilirubin: 0.7 mg/dL (ref 0.3–1.2)
Total Protein: 6 g/dL — ABNORMAL LOW (ref 6.5–8.1)

## 2018-12-01 MED ORDER — HEPARIN SOD (PORK) LOCK FLUSH 100 UNIT/ML IV SOLN
INTRAVENOUS | Status: AC
  Administered 2018-12-01: 100 [IU] via INTRAVENOUS
  Filled 2018-12-01: qty 5

## 2018-12-01 MED ORDER — HEPARIN SOD (PORK) LOCK FLUSH 100 UNIT/ML IV SOLN
500.0000 [IU] | Freq: Once | INTRAVENOUS | Status: AC
  Administered 2018-12-01: 100 [IU] via INTRAVENOUS

## 2018-12-01 MED ORDER — IOHEXOL 300 MG/ML  SOLN
100.0000 mL | Freq: Once | INTRAMUSCULAR | Status: AC | PRN
  Administered 2018-12-01: 100 mL via INTRAVENOUS

## 2018-12-01 MED ORDER — SODIUM CHLORIDE (PF) 0.9 % IJ SOLN
INTRAMUSCULAR | Status: AC
  Filled 2018-12-01: qty 50

## 2018-12-02 LAB — VITAMIN D 25 HYDROXY (VIT D DEFICIENCY, FRACTURES): VIT D 25 HYDROXY: 44.8 ng/mL (ref 30.0–100.0)

## 2018-12-02 LAB — CANCER ANTIGEN 15-3: CA 15-3: 38.8 U/mL — ABNORMAL HIGH (ref 0.0–25.0)

## 2018-12-02 LAB — CANCER ANTIGEN 27.29: CA 27.29: 41.6 U/mL — ABNORMAL HIGH (ref 0.0–38.6)

## 2018-12-07 ENCOUNTER — Other Ambulatory Visit: Payer: Self-pay | Admitting: *Deleted

## 2018-12-07 MED ORDER — LEVETIRACETAM 750 MG PO TABS: 750.0000 mg | ORAL_TABLET | Freq: Two times a day (BID) | ORAL | 3 refills | Status: DC

## 2018-12-15 ENCOUNTER — Ambulatory Visit: Payer: Self-pay

## 2018-12-20 NOTE — Progress Notes (Signed)
Disability forms successfully faxed to Shane Crutch at [phone] and to [place] at [phone]. Mailed copies to patient address on file.

## 2018-12-23 ENCOUNTER — Other Ambulatory Visit: Payer: Self-pay | Admitting: Hematology

## 2018-12-25 ENCOUNTER — Inpatient Hospital Stay: Payer: BC Managed Care – PPO | Attending: Hematology

## 2018-12-25 ENCOUNTER — Inpatient Hospital Stay: Payer: BC Managed Care – PPO

## 2018-12-25 DIAGNOSIS — C7951 Secondary malignant neoplasm of bone: Secondary | ICD-10-CM

## 2018-12-25 DIAGNOSIS — E559 Vitamin D deficiency, unspecified: Secondary | ICD-10-CM | POA: Insufficient documentation

## 2018-12-25 DIAGNOSIS — J90 Pleural effusion, not elsewhere classified: Secondary | ICD-10-CM | POA: Diagnosis not present

## 2018-12-25 DIAGNOSIS — C50812 Malignant neoplasm of overlapping sites of left female breast: Secondary | ICD-10-CM | POA: Diagnosis present

## 2018-12-25 DIAGNOSIS — C7931 Secondary malignant neoplasm of brain: Secondary | ICD-10-CM | POA: Diagnosis not present

## 2018-12-25 DIAGNOSIS — Z95828 Presence of other vascular implants and grafts: Secondary | ICD-10-CM

## 2018-12-25 DIAGNOSIS — Z170 Estrogen receptor positive status [ER+]: Secondary | ICD-10-CM | POA: Insufficient documentation

## 2018-12-25 DIAGNOSIS — C787 Secondary malignant neoplasm of liver and intrahepatic bile duct: Secondary | ICD-10-CM | POA: Insufficient documentation

## 2018-12-25 DIAGNOSIS — C7800 Secondary malignant neoplasm of unspecified lung: Secondary | ICD-10-CM | POA: Insufficient documentation

## 2018-12-25 DIAGNOSIS — C50919 Malignant neoplasm of unspecified site of unspecified female breast: Secondary | ICD-10-CM

## 2018-12-25 DIAGNOSIS — Z5111 Encounter for antineoplastic chemotherapy: Secondary | ICD-10-CM | POA: Diagnosis not present

## 2018-12-25 DIAGNOSIS — Z7189 Other specified counseling: Secondary | ICD-10-CM

## 2018-12-25 LAB — CMP (CANCER CENTER ONLY)
ALT: 18 U/L (ref 0–44)
AST: 30 U/L (ref 15–41)
Albumin: 3.2 g/dL — ABNORMAL LOW (ref 3.5–5.0)
Alkaline Phosphatase: 200 U/L — ABNORMAL HIGH (ref 38–126)
Anion gap: 10 (ref 5–15)
BILIRUBIN TOTAL: 0.6 mg/dL (ref 0.3–1.2)
BUN: 11 mg/dL (ref 8–23)
CO2: 25 mmol/L (ref 22–32)
Calcium: 9.4 mg/dL (ref 8.9–10.3)
Chloride: 107 mmol/L (ref 98–111)
Creatinine: 0.77 mg/dL (ref 0.44–1.00)
GFR, Est AFR Am: >60 mL/min (ref >60)
GFR, Estimated: >60 mL/min (ref >60)
Glucose, Bld: 173 mg/dL — ABNORMAL HIGH (ref 70–99)
Potassium: 3.4 mmol/L — ABNORMAL LOW (ref 3.5–5.1)
Sodium: 142 mmol/L (ref 135–145)
Total Protein: 6.4 g/dL — ABNORMAL LOW (ref 6.5–8.1)

## 2018-12-25 MED ORDER — DENOSUMAB 120 MG/1.7ML ~~LOC~~ SOLN
SUBCUTANEOUS | Status: AC
  Filled 2018-12-25: qty 1.7

## 2018-12-25 MED ORDER — FULVESTRANT 250 MG/5ML IM SOLN
500.0000 mg | Freq: Once | INTRAMUSCULAR | Status: AC
  Administered 2018-12-25: 500 mg via INTRAMUSCULAR

## 2018-12-25 MED ORDER — FULVESTRANT 250 MG/5ML IM SOLN
INTRAMUSCULAR | Status: AC
  Filled 2018-12-25: qty 10

## 2018-12-25 MED ORDER — DENOSUMAB 120 MG/1.7ML ~~LOC~~ SOLN
120.0000 mg | Freq: Once | SUBCUTANEOUS | Status: AC
  Administered 2018-12-25: 120 mg via SUBCUTANEOUS

## 2018-12-25 NOTE — Patient Instructions (Signed)
Fulvestrant injection What is this medicine? FULVESTRANT (ful VES trant) blocks the effects of estrogen. It is used to treat breast cancer. This medicine may be used for other purposes; ask your health care provider or pharmacist if you have questions. COMMON BRAND NAME(S): FASLODEX What should I tell my health care provider before I take this medicine? They need to know if you have any of these conditions: -bleeding problems -liver disease -low levels of platelets in the blood -an unusual or allergic reaction to fulvestrant, other medicines, foods, dyes, or preservatives -pregnant or trying to get pregnant -breast-feeding How should I use this medicine? This medicine is for injection into a muscle. It is usually given by a health care professional in a hospital or clinic setting. Talk to your pediatrician regarding the use of this medicine in children. Special care may be needed. Overdosage: If you think you have taken too much of this medicine contact a poison control center or emergency room at once. NOTE: This medicine is only for you. Do not share this medicine with others. What if I miss a dose? It is important not to miss your dose. Call your doctor or health care professional if you are unable to keep an appointment. What may interact with this medicine? -medicines that treat or prevent blood clots like warfarin, enoxaparin, and dalteparin This list may not describe all possible interactions. Give your health care provider a list of all the medicines, herbs, non-prescription drugs, or dietary supplements you use. Also tell them if you smoke, drink alcohol, or use illegal drugs. Some items may interact with your medicine. What should I watch for while using this medicine? Your condition will be monitored carefully while you are receiving this medicine. You will need important blood work done while you are taking this medicine. Do not become pregnant while taking this medicine or for  at least 1 year after stopping it. Women of child-bearing potential will need to have a negative pregnancy test before starting this medicine. Women should inform their doctor if they wish to become pregnant or think they might be pregnant. There is a potential for serious side effects to an unborn child. Men should inform their doctors if they wish to father a child. This medicine may lower sperm counts. Talk to your health care professional or pharmacist for more information. Do not breast-feed an infant while taking this medicine or for 1 year after the last dose. What side effects may I notice from receiving this medicine? Side effects that you should report to your doctor or health care professional as soon as possible: -allergic reactions like skin rash, itching or hives, swelling of the face, lips, or tongue -feeling faint or lightheaded, falls -pain, tingling, numbness, or weakness in the legs -signs and symptoms of infection like fever or chills; cough; flu-like symptoms; sore throat -vaginal bleeding Side effects that usually do not require medical attention (report to your doctor or health care professional if they continue or are bothersome): -aches, pains -constipation -diarrhea -headache -hot flashes -nausea, vomiting -pain at site where injected -stomach pain This list may not describe all possible side effects. Call your doctor for medical advice about side effects. You may report side effects to FDA at 1-800-FDA-1088. Where should I keep my medicine? This drug is given in a hospital or clinic and will not be stored at home. NOTE: This sheet is a summary. It may not cover all possible information. If you have questions about this medicine, talk to your  doctor, pharmacist, or health care provider.  2018 [employer]/Gold Standard (2015-06-06 11:03:55) Denosumab injection What is this medicine? DENOSUMAB (den oh sue mab) slows bone breakdown. Prolia is used to treat osteoporosis in  women after menopause and in men. Delton See is used to treat a high calcium level due to cancer and to prevent bone fractures and other bone problems caused by multiple myeloma or cancer bone metastases. Delton See is also used to treat giant cell tumor of the bone. This medicine may be used for other purposes; ask your health care provider or pharmacist if you have questions. COMMON BRAND NAME(S): Prolia, XGEVA What should I tell my health care provider before I take this medicine? They need to know if you have any of these conditions: -dental disease -having surgery or tooth extraction -infection -kidney disease -low levels of calcium or Vitamin D in the blood -malnutrition -on hemodialysis -skin conditions or sensitivity -thyroid or parathyroid disease -an unusual reaction to denosumab, other medicines, foods, dyes, or preservatives -pregnant or trying to get pregnant -breast-feeding How should I use this medicine? This medicine is for injection under the skin. It is given by a health care professional in a hospital or clinic setting. If you are getting Prolia, a special MedGuide will be given to you by the pharmacist with each prescription and refill. Be sure to read this information carefully each time. For Prolia, talk to your pediatrician regarding the use of this medicine in children. Special care may be needed. For Delton See, talk to your pediatrician regarding the use of this medicine in children. While this drug may be prescribed for children as young as 13 years for selected conditions, precautions do apply. Overdosage: If you think you have taken too much of this medicine contact a poison control center or emergency room at once. NOTE: This medicine is only for you. Do not share this medicine with others. What if I miss a dose? It is important not to miss your dose. Call your doctor or health care professional if you are unable to keep an appointment. What may interact with this  medicine? Do not take this medicine with any of the following medications: -other medicines containing denosumab This medicine may also interact with the following medications: -medicines that lower your chance of fighting infection -steroid medicines like prednisone or cortisone This list may not describe all possible interactions. Give your health care provider a list of all the medicines, herbs, non-prescription drugs, or dietary supplements you use. Also tell them if you smoke, drink alcohol, or use illegal drugs. Some items may interact with your medicine. What should I watch for while using this medicine? Visit your doctor or health care professional for regular checks on your progress. Your doctor or health care professional may order blood tests and other tests to see how you are doing. Call your doctor or health care professional for advice if you get a fever, chills or sore throat, or other symptoms of a cold or flu. Do not treat yourself. This drug may decrease your body's ability to fight infection. Try to avoid being around people who are sick. You should make sure you get enough calcium and vitamin D while you are taking this medicine, unless your doctor tells you not to. Discuss the foods you eat and the vitamins you take with your health care professional. See your dentist regularly. Brush and floss your teeth as directed. Before you have any dental work done, tell your dentist you are receiving this medicine. Do  not become pregnant while taking this medicine or for 5 months after stopping it. Talk with your doctor or health care professional about your birth control options while taking this medicine. Women should inform their doctor if they wish to become pregnant or think they might be pregnant. There is a potential for serious side effects to an unborn child. Talk to your health care professional or pharmacist for more information. What side effects may I notice from receiving this  medicine? Side effects that you should report to your doctor or health care professional as soon as possible: -allergic reactions like skin rash, itching or hives, swelling of the face, lips, or tongue -bone pain -breathing problems -dizziness -jaw pain, especially after dental work -redness, blistering, peeling of the skin -signs and symptoms of infection like fever or chills; cough; sore throat; pain or trouble passing urine -signs of low calcium like fast heartbeat, muscle cramps or muscle pain; pain, tingling, numbness in the hands or feet; seizures -unusual bleeding or bruising -unusually weak or tired Side effects that usually do not require medical attention (report to your doctor or health care professional if they continue or are bothersome): -constipation -diarrhea -headache -joint pain -loss of appetite -muscle pain -runny nose -tiredness -upset stomach This list may not describe all possible side effects. Call your doctor for medical advice about side effects. You may report side effects to FDA at 1-800-FDA-1088. Where should I keep my medicine? This medicine is only given in a clinic, doctor's office, or other health care setting and will not be stored at home. NOTE: This sheet is a summary. It may not cover all possible information. If you have questions about this medicine, talk to your doctor, pharmacist, or health care provider.  2018 [employer]/Gold Standard (2016-11-30 19:17:21)  

## 2018-12-26 LAB — CANCER ANTIGEN 15-3: CA 15-3: 28.1 U/mL — ABNORMAL HIGH (ref 0.0–25.0)

## 2018-12-26 LAB — CANCER ANTIGEN 27.29: CA 27.29: 30.8 U/mL (ref 0.0–38.6)

## 2018-12-29 NOTE — Progress Notes (Signed)
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  INTERVAL HISTORY   Stacey Cox is here for follow-up of her metastatic hormone positive HER-2 negative breast cancer. The patient's last visit with [place] was on 10/31/18. She is accompanied today by her friend. The pt reports that she is doing well overall.   The pt reports that she has recently lost her appetite and has been more tired, but notes that over the holidays she was eating very well and had a healthy appetite. She notes that her recent tiredness is limiting her from wanting to get up and fix herself something to eat. She has lost 11 pounds since our last visit. She notes that she also senses that depression is limiting her desire to eat. She notes that her depression has increased since returning from her time with family in [place]. The pt notes that she intends to return to speak with her counselor, who she has not seen in a while, and plans on enrolling in art therapy as well. She also notes a plan to see our nutritional therapist Ernestene Kiel.  Of note since the patient's last visit, pt has had a CT C/A/P completed on 12/01/18 with results revealing Marked improvement of tumor burden in the liver. 2. There several areas of increased sclerosis in the spine, more notable in the upper thoracic spine. Given the soft tissue findings in the liver, I suspect that this is probably a treatment effect revealing areas of prior metastatic disease which are no healing, rather than necessarily indicating actual progression of the osseous metastatic disease. 3. Trace left pleural effusion with mild scattered atelectasis or scarring in the lungs. 4. Moderate ascites with scattered edema  in the mesentery and omentum. 5. Other imaging findings of potential clinical significance: Sigmoid colon diverticulosis. Aortic Atherosclerosis. Coronary atherosclerosis.  Lab results (12/25/18) of CMP is as follows: all values are WNL except for Potassium at 3.4, Glucose at 173, Total Protein at 6.4, Albumin at 3.2, Alk Phos at 200. 12/25/18 CA 27.29 at 30.8 12/25/18 CA 15.3 at 28.1  On review of systems, pt reports feeling tired, loss of appetite, weight loss, depression, moving her bowels well, and denies changes in bowel habits, blood in the stools, abdominal pains, leg swelling, and any other symptoms.  MEDICAL HISTORY:  Past Medical History:  Diagnosis Date  . Cancer [facility])    Metastatic Breast Cancer    SURGICAL HISTORY: Past Surgical History:  Procedure Laterality Date  . CHOLECYSTECTOMY    . IR IMAGING GUIDED PORT INSERTION  07/14/2018  . TONSILLECTOMY      SOCIAL HISTORY: Social History   Socioeconomic History  . Marital status: Single    Spouse name: Not on file  . Number of children: Not on file  . Years of education: Not on file  . Highest education level: Not on file  Occupational History  . Not on file  Social Needs  . Financial resource strain: Not on file  . Food insecurity:    Worry: Not on file    Inability: Not on file  . Transportation needs:    Medical: Not on file    Non-medical: Not on file  Tobacco Use  . Smoking status:  Current Every Day Smoker    Packs/day: 0.15    Types: Cigarettes    Last attempt to quit: 01/21/2017    Years since quitting: 1.9  . Smokeless tobacco: Never Used  Substance and Sexual Activity  . Alcohol use: No    Alcohol/week: 0.0 standard drinks  . Drug use: No  . Sexual activity: Never  Lifestyle  . Physical activity:    Days per week: Not on file    Minutes per session: Not on file  . Stress: Not on file  Relationships  . Social connections:    Talks on phone: Not on file    Gets together: Not on file    Attends  religious service: Not on file    Active member of club or organization: Not on file    Attends meetings of clubs or organizations: Not on file    Relationship status: Not on file  . Intimate partner violence:    Fear of current or ex partner: No    Emotionally abused: No    Physically abused: No    Forced sexual activity: No  Other Topics Concern  . Not on file  Social History Narrative   Lives alone. 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 Has phobia of the medical system.  Cigarette smoker 1/2 PPD for about 40 yrs Social alcohol use No drugs Professor at [employer] no children  FAMILY HISTORY: Family History  Problem Relation Age of Onset  . Cancer Mother        breast  . Cancer Sister        breast  Mother with h/o breast cancer in her 23's Sister with breast cancer in her 39ys and later was diagnosed with multiple myeloma. (Patient is not aware of any specific breast cancer mutations present)  ALLERGIES:  is allergic to thorazine [chlorpromazine] and ribociclib.  MEDICATIONS:  Current Outpatient Medications  Medication Sig Dispense Refill  . aspirin EC 81 MG tablet Take 1 tablet (81 mg total) by mouth daily.    . chlorhexidine (PERIDEX) 0.12 % solution RINSE AND GARGLE 15 ML BY MOUTH OR THROAT TWICE DAILY AS DIRECTED AS NEEDED 473 mL 1  . dexamethasone (DECADRON) 2 MG tablet Take 0.5 tablets (1 mg total) by mouth daily. 30 tablet 2  . Emollient (AQUAPHOR EX) Apply topically. 2 x day to lower legs    . escitalopram (LEXAPRO) 20 MG tablet TAKE 1 TABLET(20 MG) BY MOUTH DAILY 30 tablet 0  . feeding supplement, ENSURE ENLIVE, (ENSURE ENLIVE) LIQD Take 237 mLs by mouth 2 (two) times daily between meals. (Patient taking differently: Take 237-474 mLs by mouth 2 (two) times daily between meals. ) 237 mL 12  . furosemide (LASIX) 20 MG tablet Take 1 tablet (20 mg total) by mouth daily. 5 tablet 0  . levETIRAcetam (KEPPRA) 750 MG tablet Take 1 tablet (750 mg total) by mouth 2 (two)  times daily. 60 tablet 3  . lidocaine-prilocaine (EMLA) cream Apply to affected area once 30 g 3  . loperamide (IMODIUM) 1 MG/5ML solution Take 2 mg by mouth as needed for diarrhea or loose stools.     [name] LORazepam (ATIVAN) 0.5 MG tablet Take 1-2 tablets (0.5-1 mg total) by mouth every 8 (eight) hours as needed for anxiety (for anxiety and prior to CT/PET/MRI scan for anxiety/calustrophobia). 30 tablet 0  . nystatin (MYCOSTATIN) 100000 UNIT/ML suspension Take 5 mLs (500,000 Units total) by mouth 4 (four) times daily. (Patient not taking: Reported on 11/07/2018) 473 mL  0  . pantoprazole (PROTONIX) 40 MG tablet TAKE 1 TABLET(40 MG) BY MOUTH DAILY 30 tablet 1  . potassium chloride (K-DUR) 10 MEQ tablet Take 1 tablet (10 mEq total) by mouth daily. (Patient not taking: Reported on 10/10/2018) 5 tablet 0  . potassium chloride SA (K-DUR,KLOR-CON) 20 MEQ tablet Take 1 tablet (20 mEq total) by mouth daily. 30 tablet 1  . Vitamin D, Ergocalciferol, (DRISDOL) 50000 units CAPS capsule TAKE 1 CAPSULE BY MOUTH 3 TIMES WEEKLY (Patient taking differently: Take 50,000 Units by mouth. TAKE 1 CAPSULE BY MOUTH 3 TIMES WEEKLY) 24 capsule 0   No current facility-administered medications for this visit.     REVIEW OF SYSTEMS:    A 10+ POINT REVIEW OF SYSTEMS WAS OBTAINED including neurology, dermatology, psychiatry, cardiac, respiratory, lymph, extremities, GI, GU, Musculoskeletal, constitutional, breasts, reproductive, HEENT.  All pertinent positives are noted in the HPI.  All others are negative.   PHYSICAL EXAMINATION:  ECOG PERFORMANCE STATUS: 1 - Symptomatic but completely ambulatory  Vitals:   01/01/19 1030  BP: (!) 84/60  Pulse: (!) 133  Resp: 18  Temp: 98 F (36.7 C)  SpO2: 99%   Filed Weights   01/01/19 1030  Weight: 158 lb (71.7 kg)   .Body mass index is 26.29 kg/m.  GENERAL:alert, in no acute distress and comfortable SKIN: no acute rashes, no significant lesions EYES: conjunctiva are pink  and non-injected, sclera anicteric OROPHARYNX: MMM, no exudates, no oropharyngeal erythema or ulceration NECK: supple, no JVD LYMPH:  no palpable lymphadenopathy in the cervical, axillary or inguinal regions LUNGS: clear to auscultation b/l with normal respiratory effort HEART: regular rate & rhythm ABDOMEN:  normoactive bowel sounds , non tender, not distended. No palpable hepatosplenomegaly.  Extremity: no pedal edema PSYCH: alert & oriented x 3 with fluent speech NEURO: no focal motor/sensory deficits   LABORATORY DATA:  I have reviewed the data as listed  . CBC Latest Ref Rng & Units 12/01/2018 10/31/2018 10/10/2018  WBC 4.0 - 10.5 K/uL 7.2 12.2(H) 10.8(H)  Hemoglobin 12.0 - 15.0 g/dL 11.6(L) 10.4(L) 10.2(L)  Hematocrit 36.0 - 46.0 % 37.1 33.0(L) 32.8(L)  Platelets 150 - 400 K/uL 139(L) 192 182   CBC    Component Value Date/Time   WBC 7.2 12/01/2018 1203   RBC 3.70 (L) 12/01/2018 1203   HGB 11.6 (L) 12/01/2018 1203   HGB 10.2 (L) 10/10/2018 0823   HGB 13.9 11/24/2017 1340   HCT 37.1 12/01/2018 1203   HCT 42.0 11/24/2017 1340   PLT 139 (L) 12/01/2018 1203   PLT 182 10/10/2018 0823   PLT 212 11/24/2017 1340   MCV 100.3 (H) 12/01/2018 1203   MCV 97.0 11/24/2017 1340   MCH 31.4 12/01/2018 1203   MCHC 31.3 12/01/2018 1203   RDW 15.9 (H) 12/01/2018 1203   RDW 14.1 11/24/2017 1340   LYMPHSABS 1.0 12/01/2018 1203   LYMPHSABS 1.4 11/24/2017 1340   MONOABS 0.9 12/01/2018 1203   MONOABS 0.3 11/24/2017 1340   EOSABS 0.1 12/01/2018 1203   EOSABS 0.1 11/24/2017 1340   BASOSABS 0.1 12/01/2018 1203   BASOSABS 0.1 11/24/2017 1340    . CMP Latest Ref Rng & Units 12/25/2018 12/01/2018 10/31/2018  Glucose 70 - 99 mg/dL 173(H) 117(H) 80  BUN 8 - 23 mg/dL _0 Creatinine 0.44 - 1.00 mg/dL 0.77 0.67 0.66  Sodium 135 - 145 mmol/L 142 140 142  Potassium 3.5 - 5.1 mmol/L 3.4(L) 3.9 3.7  Chloride 98 - 111 mmol/L 107 107 108  CO2 22 - 32 mmol/L _0 Calcium 8.9 - 10.3 mg/dL  9.4 8.8(L) 9.0  Total Protein 6.5 - 8.1 g/dL 6.4(L) 6.0(L) 5.9(L)  Total Bilirubin 0.3 - 1.2 mg/dL 0.6 0.7 0.4  Alkaline Phos 38 - 126 U/L 200(H) 234(H) 236(H)  AST 15 - 41 U/L 30 32 45(H)  ALT 0 - 44 U/L 18 32 46(H)   Component     Latest Ref Rng & Units 12/25/2018  CA 27.29     0.0 - 38.6 U/mL 30.8  CA 15-3     0.0 - 25.0 U/mL 28.1 (H)        07/18/18 Pathology:          RADIOGRAPHIC STUDIES: I have personally reviewed the radiological images as listed and agreed with the findings in the report. No results found.  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 Multifocal tumor in the left breast with biopsy-proven left axillary metastases.   Noted to have extensive bone metastases and pulmonary metastases. Patient was noted to have calvarial metastases but no overt parenchymal metastasis 09/14/17 CT Chest/ad/pelvis Results discussed in details - good response.  02/22/18 CT revealed  Newly apparent 2.1 by 2.0 cm rim enhancing lesion in the right hepatic lobe suspicious for a metastatic lesion. This was not apparent on prior exams although the prior exams were all noncontrast and thus the lesion may have been present but with reduced conspicuity. There is also a small enhancing lesion further posteriorly in the right hepatic lobe which is technically nonspecific and could be a small hemangioma or a small metastatic lesion. Stable distribution and appearance of prior sclerotic osseous metastatic disease without bony progression. 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   07/06/18 CT C/A/P which revealed New pattern of heterogeneous enhancement and enhancing ill-defined lesions in the central LEFT and RIGHT hepatic lobe at site prior enhancing lesion is highly concerning for progression of of  infiltrative malignancy in the liver occupying a large portion of the RIGHT hepatic lobe and central LEFT hepatic lobe. 2. Small amount of free fluid in the abdomen pelvis. 3. No metastatic adenopathy.4. No evidence pulmonary metastasis or lymphadenopathy. 5. Stable dense sclerotic skeletal metastasis   07/11/18 ECHO revealed LV EF of 55%-60%   07/18/18 Liver needle/core biopsy right lesion consi with Ki67 at 40%, ER/PR +ve   07/28/18 MRI Abdomen MRCP revealed Extensive hepatic metastatic disease, possibly progressive from recent abdominal CT. 2. No evidence of intrahepatic or extrahepatic biliary dilatation. Elevated liver function studies may be secondary to intrahepatic cholestasis. 3. Increased ascites without peritoneal nodularity or suspicious enhancement. 4. Grossly stable widespread osseous metastatic disease.   S/p 6 cycles of AC completed on 11/01/18  #2 Bone metastases due to breast cancer- on Xgeva. Much improved back pain.  # 3 Neutropenia Related to her Ribociclib - resolved. Patient is currently on Verzenio and has not developed any neutropenia. This is being monitored.  #4 Increased LFTs due to new liver metastases  #5 Cancer related pain - controlled - status post palliative RT to spine - no currently needing any pain medications.  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 No other new medication. Now resolved. Monitoring on increasing doses of Verzenio. No issues with recurrent rash on Verzenio 177m po BID  #  7 Brain metastasis  07/20/18 CT Head revealed 1.8 x 2.4 x 3.9 cm LEFT frontal cortical based mass with possible dural component highly concerning for metastatic disease, less likely abscess or cerebritis. Severe vasogenic edema with 5 mm LEFT to RIGHT subfalcine herniation. No ventricular entrapment. 2. 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 3. Multiple calvarial metastasis. 4. Cerebellar atrophy.     07/21/18 MRI Brain revealed  Irregular plaque-like mass, likely metastasis, measuring up to 5.1 cm, appears to originate from the left anterolateral frontal dura with invasion of the underlying frontal lobe of the brain. Mass effect from brain edema and the lesion partially effaces the frontal horn of left lateral ventricle and results in 7 mm left-to-right midline shift of anterior septum pellucidum. 2. Diffuse dural thickening over the left cerebral convexity may represent edema associated with the tumor or invasive neoplasm. 3. 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   07/28/18 MRI Brain revealed Extensive nodular dural enhancement left frontal lobe with associated enhancing mass growing in the left frontal lobe is unchanged in size and compatible with metastatic disease. This is associated with metastatic disease to the left frontal bone. Extensive edema in the left frontal lobe has progressed in the interval. Blastic metastatic disease throughout the calvarium. Metastatic disease in the cervical spine. Enhancing mass along the floor of the posterior fossa on the right is stable and most consistent with meningioma. 6 mm enhancing mass in the prepontine cistern on the right also unchanged and compatible with meningioma.   s/p SRS radiation therapy  10/23/18 MRI Brain revealed SRS protocol demonstrating treatment response: Regression of LEFT frontal dural metastasis, decreased vasogenic edema and parenchymal invasion. Similar calvarial metastasis. 2. Stable appearance of 2 small posterior fossa meningiomas. 3. No acute intracranial process.   PLAN:  -Continue follow up with Dr. Mickeal Skinner in Neuro-oncology and Dr. Tammi Klippel in [place] taking Claritin for Neulasta related pain -Continue with Delton See every 4 weeks   #8 Vitamin D deficiency Vit D levels 23.1 on 3/13. Back down to 19  #9 Left upper extremity swelling- [place] neg for DVT  PLAN:  -Discussed  pt labwork from 12/25/18; CA 27.29 normalized to 30.8, and CA 15.3 decreased to 28.1 -Discussed the 12/01/18 CT C/A/P which revealed Marked improvement of tumor burden in the liver. 2. There several areas of increased sclerosis in the spine, more notable in the upper thoracic spine. Given the soft tissue findings in the liver, I suspect that this is probably a treatment effect revealing areas of prior metastatic disease which are no healing, rather than necessarily indicating actual progression of the osseous metastatic disease. 3. Trace left pleural effusion with mild scattered atelectasis or scarring in the lungs. 4. Moderate ascites with scattered edema in the mesentery and omentum. 5. Other imaging findings of potential clinical significance: Sigmoid colon diverticulosis. Aortic Atherosclerosis. Coronary atherosclerosis. -Pt will return to care with her counselor, and will enroll in art therapy to actively address her depression.  -Will refer the pt to outpatient PT -The pt also notes that she will make an appointment with our nutritional therapist -Encouraged the pt to begin consuming 2-3 meals a day in addition to 2 Boost/Ensure, which she notes she will do -Stressed the importance of the pt staying hydrated as well, which she notes she will do -Will consider beginning Taxol at next visit in 3 weeks after the pt sees counselor, begins PT, sees  our nutritional therapist, and begins eating better -Continue follow up with neuro onc as per their recommendations. -Continue Keppra  -Continue 10MEQ Potassium PO BID replacement  -Continue Ergocalciferol 50k units, three times a week, with fatty foods to aide absorption -Continue Faslodex and Xgeva every 4 weeks  -Pt is in communication with [place] cultured buttermilk or cultured yogurt to prevent thrush -Will see the pt back in 3 weeks   Continue Faslodex and Xgeva q4weeks (next dose on 01/22/2019 as scheduled) Plz schedule labs and MD  visit on 01/22/2019 as well Referral to outpatient cancer rehab Referral to nutritional therapy   All of the patients questions were answered with apparent satisfaction. The patient knows to call the clinic with any problems, questions or concerns.   The total time spent in the appt was 40 minutes and more than 50% was on counseling and direct patient cares.    Sullivan Lone MD [place] AAHIVMS [facility] Hematology/Oncology Physician [facility]  (Office):       [phone] (Work cell):  [phone] (Fax):           [phone]  I, Baldwin Jamaica, am acting as a scribe for Dr. Sullivan Lone.   .I have reviewed the above documentation for accuracy and completeness, and I agree with the above. [name] MD

## 2019-01-01 ENCOUNTER — Telehealth: Payer: Self-pay | Admitting: Hematology

## 2019-01-01 ENCOUNTER — Inpatient Hospital Stay (HOSPITAL_BASED_OUTPATIENT_CLINIC_OR_DEPARTMENT_OTHER): Payer: BC Managed Care – PPO | Admitting: Hematology

## 2019-01-01 VITALS — BP 84/60 | HR 133 | Temp 98.0°F | Resp 18 | Ht 65.0 in | Wt 158.0 lb

## 2019-01-01 DIAGNOSIS — J90 Pleural effusion, not elsewhere classified: Secondary | ICD-10-CM

## 2019-01-01 DIAGNOSIS — E559 Vitamin D deficiency, unspecified: Secondary | ICD-10-CM

## 2019-01-01 DIAGNOSIS — C787 Secondary malignant neoplasm of liver and intrahepatic bile duct: Secondary | ICD-10-CM | POA: Diagnosis not present

## 2019-01-01 DIAGNOSIS — E440 Moderate protein-calorie malnutrition: Secondary | ICD-10-CM

## 2019-01-01 DIAGNOSIS — C50812 Malignant neoplasm of overlapping sites of left female breast: Secondary | ICD-10-CM

## 2019-01-01 DIAGNOSIS — Z5111 Encounter for antineoplastic chemotherapy: Secondary | ICD-10-CM | POA: Diagnosis not present

## 2019-01-01 DIAGNOSIS — C7951 Secondary malignant neoplasm of bone: Secondary | ICD-10-CM | POA: Diagnosis not present

## 2019-01-01 DIAGNOSIS — C7931 Secondary malignant neoplasm of brain: Secondary | ICD-10-CM

## 2019-01-01 DIAGNOSIS — C7800 Secondary malignant neoplasm of unspecified lung: Secondary | ICD-10-CM

## 2019-01-01 DIAGNOSIS — C50919 Malignant neoplasm of unspecified site of unspecified female breast: Secondary | ICD-10-CM

## 2019-01-01 DIAGNOSIS — Z170 Estrogen receptor positive status [ER+]: Secondary | ICD-10-CM

## 2019-01-01 NOTE — Telephone Encounter (Signed)
Called patient and scheduled appt per 02/10 los.  Patient aware of appt date and time.

## 2019-01-04 ENCOUNTER — Inpatient Hospital Stay: Payer: BC Managed Care – PPO | Admitting: Nutrition

## 2019-01-04 ENCOUNTER — Other Ambulatory Visit: Payer: Self-pay | Admitting: Hematology

## 2019-01-04 NOTE — Progress Notes (Signed)
Nutrition follow-up completed with patient who is being treated for metastatic breast cancer.  She is a patient of Dr. Irene Limbo. Weight decreased and documented as 158 pounds February 10 down from 167.6 pounds November 19. Patient reports after she got back from spending time with family her appetite decreased and she was feeling a little bit depressed. She was drinking Ensure max and seemed to like it.  She would like information on easy to prepare foods or food she can buy at the grocery store because she does not enjoy cooking.  Nutrition diagnosis: Unintended weight loss continues.  Intervention: Patient was educated to consume small frequent meals and snacks with high-calorie, high-protein foods. Provided education on foods she can purchase at the grocery store that will be easy to heat and eat. Encourage patient to increase physical activity as tolerated to improve appetite and energy. Provided strategies for increasing hydration. Provided recipes on smoothies. Recommended patient consume Ensure Plus or boost plus twice daily between meals.  Coupons were provided. Questions were answered.  Teach back method used.  Contact information has been given.  Monitoring, evaluation, goals: Patient will tolerate increased calories and protein to minimize weight loss and improve energy.  Next visit: Patient will contact me for follow-up.  **Disclaimer: This note was dictated with voice recognition software. Similar sounding words can inadvertently be transcribed and this note may contain transcription errors which may not have been corrected upon publication of note.**

## 2019-01-06 ENCOUNTER — Other Ambulatory Visit: Payer: Self-pay | Admitting: Hematology

## 2019-01-08 ENCOUNTER — Other Ambulatory Visit: Payer: Self-pay | Admitting: Hematology

## 2019-01-09 ENCOUNTER — Other Ambulatory Visit: Payer: Self-pay | Admitting: Hematology

## 2019-01-09 NOTE — Telephone Encounter (Signed)
Is patient to take vitamin D three times a week or twice weekly?  Should quantity be increased to 36 capsules if three times a week?

## 2019-01-12 ENCOUNTER — Ambulatory Visit: Payer: Self-pay

## 2019-01-19 NOTE — Progress Notes (Signed)
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  INTERVAL HISTORY   Stacey Cox is here for follow-up of her metastatic hormone positive HER-2 negative breast cancer. The patient's last visit with [place] was on 01/01/2019.  She has been doing well overall. She started Art Therapy recently and she enjoys it thus far. She is taking 20 mg potassium instead of 10 mg. She hasn't been using imodium as much.   Labs today, 01/22/2019 show CBC w/diff and CMP with: CMP with improving levels. Cancer antigen 15-3 and 27.29  Continue to be down trending.  She is scheduled for a MRI Brain on 02/02/2019. She has a follow up with Dr. Mickeal Skinner on 02/06/2019.   On review of systems, she reports improving energy levels and decreased appetite. She eats one meal a day and snacks throughout the day. She eats "okay" not well, but "okay". Pt states that she is eating more than she did prior to the last visit. However, she notes that when her friend comes into town this week, she will eat better. Her birthday was yesterday and when her friend comes into town, she will spend some time with her friend and get either a massage or a pedicure. she denies insomnia, leg swelling, rashes, and any other symptoms. Pertinent positives are listed and detailed within the above HPI.  MEDICAL HISTORY:  Past Medical History:  Diagnosis Date  . Cancer [facility])    Metastatic Breast Cancer    SURGICAL HISTORY: Past Surgical History:  Procedure Laterality Date  . CHOLECYSTECTOMY    . IR IMAGING GUIDED PORT INSERTION  07/14/2018  . TONSILLECTOMY      SOCIAL HISTORY: Social History   Socioeconomic History  . Marital status: Single    Spouse name: Not on file  .  Number of children: Not on file  . Years of education: Not on file  . Highest education level: Not on file  Occupational History  . Not on file  Social Needs  . Financial resource strain: Not on file  . Food insecurity:    Worry: Not on file    Inability: Not on file  . Transportation needs:    Medical: Not on file    Non-medical: Not on file  Tobacco Use  . Smoking status: Current Every Day Smoker    Packs/day: 0.15    Types: Cigarettes    Last attempt to quit: 01/21/2017    Years since quitting: 2.0  . Smokeless tobacco: Never Used  Substance and Sexual Activity  . Alcohol use: No    Alcohol/week: 0.0 standard drinks  . Drug use: No  . Sexual activity: Never  Lifestyle  . Physical activity:    Days per week: Not on file    Minutes per session: Not on file  . Stress: Not on file  Relationships  . Social connections:    Talks on phone: Not on file    Gets together: Not on file    Attends religious service: Not on file    Active member of club or organization: Not on file    Attends meetings of clubs or organizations: Not on file    Relationship status: Not on file  . Intimate partner violence:  Fear of current or ex partner: No    Emotionally abused: No    Physically abused: No    Forced sexual activity: No  Other Topics Concern  . Not on file  Social History Narrative   Lives alone. 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 Has phobia of the medical system.  Cigarette smoker 1/2 PPD for about 40 yrs Social alcohol use No drugs Professor at [employer] no children  FAMILY HISTORY: Family History  Problem Relation Age of Onset  . Cancer Mother        breast  . Cancer Sister        breast  Mother with h/o breast cancer in her 31's Sister with breast cancer in her 58ys and later was diagnosed with multiple myeloma. (Patient is not aware of any specific breast cancer mutations present)  ALLERGIES:  is allergic to thorazine [chlorpromazine] and  ribociclib.  MEDICATIONS:  Current Outpatient Medications  Medication Sig Dispense Refill  . aspirin EC 81 MG tablet Take 1 tablet (81 mg total) by mouth daily.    . chlorhexidine (PERIDEX) 0.12 % solution RINSE AND GARGLE 15 ML BY MOUTH OR THROAT TWICE DAILY AS DIRECTED AS NEEDED 473 mL 1  . dexamethasone (DECADRON) 2 MG tablet Take 0.5 tablets (1 mg total) by mouth daily. 30 tablet 2  . Emollient (AQUAPHOR EX) Apply topically. 2 x day to lower legs    . escitalopram (LEXAPRO) 20 MG tablet TAKE 1 TABLET(20 MG) BY MOUTH DAILY 30 tablet 0  . feeding supplement, ENSURE ENLIVE, (ENSURE ENLIVE) LIQD Take 237 mLs by mouth 2 (two) times daily between meals. (Patient taking differently: Take 237-474 mLs by mouth 2 (two) times daily between meals. ) 237 mL 12  . furosemide (LASIX) 20 MG tablet Take 1 tablet (20 mg total) by mouth daily. 5 tablet 0  . levETIRAcetam (KEPPRA) 750 MG tablet Take 1 tablet (750 mg total) by mouth 2 (two) times daily. 60 tablet 3  . lidocaine-prilocaine (EMLA) cream Apply to affected area once 30 g 3  . loperamide (IMODIUM) 1 MG/5ML solution Take 2 mg by mouth as needed for diarrhea or loose stools.     [name] LORazepam (ATIVAN) 0.5 MG tablet Take 1-2 tablets (0.5-1 mg total) by mouth every 8 (eight) hours as needed for anxiety (for anxiety and prior to CT/PET/MRI scan for anxiety/calustrophobia). 30 tablet 0  . nystatin (MYCOSTATIN) 100000 UNIT/ML suspension Take 5 mLs (500,000 Units total) by mouth 4 (four) times daily. (Patient not taking: Reported on 11/07/2018) 473 mL 0  . pantoprazole (PROTONIX) 40 MG tablet TAKE 1 TABLET(40 MG) BY MOUTH DAILY 30 tablet 1  . potassium chloride (K-DUR) 10 MEQ tablet Take 1 tablet (10 mEq total) by mouth daily. (Patient not taking: Reported on 10/10/2018) 5 tablet 0  . potassium chloride SA (K-DUR,KLOR-CON) 20 MEQ tablet TAKE 1 TABLET(20 MEQ) BY MOUTH DAILY 30 tablet 1  . Vitamin D, Ergocalciferol, (DRISDOL) 1.25 MG (50000 UT) CAPS capsule TAKE  1 CAPSULE BY MOUTH 3 TIMES WEEKLY 24 capsule 0   No current facility-administered medications for this visit.     REVIEW OF SYSTEMS:    A 10+ POINT REVIEW OF SYSTEMS WAS OBTAINED including neurology, dermatology, psychiatry, cardiac, respiratory, lymph, extremities, GI, GU, Musculoskeletal, constitutional, breasts, reproductive, HEENT.  All pertinent positives are noted in the HPI.  All others are negative.   PHYSICAL EXAMINATION:  ECOG PERFORMANCE STATUS: 1 - Symptomatic but completely ambulatory  Vitals:   01/22/19 1020  BP:  105/86  Pulse: (!) 108  Resp: 18  Temp: 98.2 F (36.8 C)  SpO2: 99%   Filed Weights   01/22/19 1020  Weight: 155 lb 6.4 oz (70.5 kg)   Body mass index is 25.86 kg/m.  GENERAL:alert, in no acute distress and comfortable SKIN: no acute rashes, no significant lesions EYES: conjunctiva are pink and non-injected, sclera anicteric OROPHARYNX: MMM, no exudates, no oropharyngeal erythema or ulceration NECK: supple, no JVD LYMPH:  no palpable lymphadenopathy in the cervical, axillary or inguinal regions LUNGS: clear to auscultation b/l with normal respiratory effort HEART: regular rate & rhythm ABDOMEN:  normoactive bowel sounds , non tender, not distended. No palpable hepatosplenomegaly.  Extremity: no pedal edema PSYCH: alert & oriented x 3 with fluent speech NEURO: no focal motor/sensory deficits   LABORATORY DATA:  I have reviewed the data as listed  CBC Latest Ref Rng & Units 01/22/2019 12/01/2018 10/31/2018  WBC 4.0 - 10.5 K/uL 6.5 7.2 12.2(H)  Hemoglobin 12.0 - 15.0 g/dL 13.9 11.6(L) 10.4(L)  Hematocrit 36.0 - 46.0 % 44.9 37.1 33.0(L)  Platelets 150 - 400 K/uL 189 139(L) 192   CBC    Component Value Date/Time   WBC 6.5 01/22/2019 0949   RBC 4.76 01/22/2019 0949   HGB 13.9 01/22/2019 0949   HGB 10.2 (L) 10/10/2018 0823   HGB 13.9 11/24/2017 1340   HCT 44.9 01/22/2019 0949   HCT 42.0 11/24/2017 1340   PLT 189 01/22/2019 0949   PLT 182  10/10/2018 0823   PLT 212 11/24/2017 1340   MCV 94.3 01/22/2019 0949   MCV 97.0 11/24/2017 1340   MCH 29.2 01/22/2019 0949   MCHC 31.0 01/22/2019 0949   RDW 14.0 01/22/2019 0949   RDW 14.1 11/24/2017 1340   LYMPHSABS 2.0 01/22/2019 0949   LYMPHSABS 1.4 11/24/2017 1340   MONOABS 0.6 01/22/2019 0949   MONOABS 0.3 11/24/2017 1340   EOSABS 0.1 01/22/2019 0949   EOSABS 0.1 11/24/2017 1340   BASOSABS 0.0 01/22/2019 0949   BASOSABS 0.1 11/24/2017 1340    . CMP Latest Ref Rng & Units 01/22/2019 12/25/2018 12/01/2018  Glucose 70 - 99 mg/dL 125(H) 173(H) 117(H)  BUN 8 - 23 mg/dL 5(L) 11 8  Creatinine 0.44 - 1.00 mg/dL 0.74 0.77 0.67  Sodium 135 - 145 mmol/L 144 142 140  Potassium 3.5 - 5.1 mmol/L 3.5 3.4(L) 3.9  Chloride 98 - 111 mmol/L 110 107 107  CO2 22 - 32 mmol/L 22 25 26   Calcium 8.9 - 10.3 mg/dL 7.9(L) 9.4 8.8(L)  Total Protein 6.5 - 8.1 g/dL 6.8 6.4(L) 6.0(L)  Total Bilirubin 0.3 - 1.2 mg/dL 0.5 0.6 0.7  Alkaline Phos 38 - 126 U/L 199(H) 200(H) 234(H)  AST 15 - 41 U/L 28 30 32  ALT 0 - 44 U/L 18 18 32   07/18/18 Pathology:          RADIOGRAPHIC STUDIES: I have personally reviewed the radiological images as listed and agreed with the findings in the report. No results found.  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 Multifocal tumor in the left breast with biopsy-proven left axillary metastases.   Noted to have extensive bone metastases and pulmonary metastases. Patient was noted to have calvarial metastases but no overt parenchymal metastasis 09/14/17 CT Chest/ad/pelvis Results discussed in details - good response.  02/22/18 CT revealed  Newly apparent 2.1 by 2.0 cm rim enhancing  lesion in the right hepatic lobe suspicious for a metastatic lesion. This was not apparent on prior exams although the prior exams were all noncontrast and thus the lesion may have  been present but with reduced conspicuity. There is also a small enhancing lesion further posteriorly in the right hepatic lobe which is technically nonspecific and could be a small hemangioma or a small metastatic lesion. Stable distribution and appearance of prior sclerotic osseous metastatic disease without bony progression. 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   07/06/18 CT C/A/P which revealed New pattern of heterogeneous enhancement and enhancing ill-defined lesions in the central LEFT and RIGHT hepatic lobe at site prior enhancing lesion is highly concerning for progression of of infiltrative malignancy in the liver occupying a large portion of the RIGHT hepatic lobe and central LEFT hepatic lobe. 2. Small amount of free fluid in the abdomen pelvis. 3. No metastatic adenopathy.4. No evidence pulmonary metastasis or lymphadenopathy. 5. Stable dense sclerotic skeletal metastasis   07/11/18 ECHO revealed LV EF of 55%-60%   07/18/18 Liver needle/core biopsy right lesion consi with Ki67 at 40%, ER/PR +ve   07/28/18 MRI Abdomen MRCP revealed Extensive hepatic metastatic disease, possibly progressive from recent abdominal CT. 2. No evidence of intrahepatic or extrahepatic biliary dilatation. Elevated liver function studies may be secondary to intrahepatic cholestasis. 3. Increased ascites without peritoneal nodularity or suspicious enhancement. 4. Grossly stable widespread osseous metastatic disease.   S/p 6 cycles of AC completed on 11/01/18  #2 Bone metastases due to breast cancer- on Xgeva. Much improved back pain.  # 3 Neutropenia Related to her Ribociclib - resolved. Patient is currently on Verzenio and has not developed any neutropenia. This is being monitored.  #4 Increased LFTs due to new liver metastases  #5 Cancer related pain - controlled - status post palliative RT to spine - no currently needing any pain  medications.  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 No other new medication. Now resolved. Monitoring on increasing doses of Verzenio. No issues with recurrent rash on Verzenio 174m po BID  #7 Brain metastasis  07/20/18 CT Head revealed 1.8 x 2.4 x 3.9 cm LEFT frontal cortical based mass with possible dural component highly concerning for metastatic disease, less likely abscess or cerebritis. Severe vasogenic edema with 5 mm LEFT to RIGHT subfalcine herniation. No ventricular entrapment. 2. 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 3. Multiple calvarial metastasis. 4. Cerebellar atrophy.    07/21/18 MRI Brain revealed  Irregular plaque-like mass, likely metastasis, measuring up to 5.1 cm, appears to originate from the left anterolateral frontal dura with invasion of the underlying frontal lobe of the brain. Mass effect from brain edema and the lesion partially effaces the frontal horn of left lateral ventricle and results in 7 mm left-to-right midline shift of anterior septum pellucidum. 2. Diffuse dural thickening over the left cerebral convexity may represent edema associated with the tumor or invasive neoplasm. 3. 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   07/28/18 MRI Brain revealed Extensive nodular dural enhancement left frontal lobe with associated enhancing mass growing in the left frontal lobe is unchanged in size and compatible with metastatic disease. This is associated with metastatic disease to the left frontal bone. Extensive edema in the left frontal lobe has progressed in the interval. Blastic metastatic disease throughout the calvarium.  Metastatic disease in the cervical spine. Enhancing mass along the floor of the posterior fossa on the right is stable and most consistent with meningioma. 6 mm enhancing mass in the prepontine cistern on the right also unchanged and compatible  with meningioma.   s/p SRS radiation therapy  10/23/18 MRI Brain revealed SRS protocol demonstrating treatment response: Regression of LEFT frontal dural metastasis, decreased vasogenic edema and parenchymal invasion. Similar calvarial metastasis. 2. Stable appearance of 2 small posterior fossa meningiomas. 3. No acute intracranial process.   PLAN:  -Continue follow up with Dr. Mickeal Skinner in Neuro-oncology on 02/06/2019 and Dr. Tammi Klippel in [place] with Delton See every 4 weeks   #8 Vitamin D deficiency Vit D levels improved to 44.8  On 12/01/2018 -continue Vit D replacement.  #9 h/o Left upper extremity swelling- [place] neg for DVT - now swelling resolved.  PLAN:  -Discussed pt lab work from 02/06/2019 that showed CBC w/diff and CMP with: CMP with improving levels. Cancer antigen 15-3 and 27.29 continue to show improving trend -MRI on 02/02/2019 with follow up with Dr. Mickeal Skinner on 02/06/2019 -Patient to continue with Art Therapy and outpatient PT -Discussed with the patient to continue improving her diet. She eats one meal a day and snacks throughout the day.  -Continue Keppra  -Continue 20MEQ Potassium PO BID replacement  -Continue Ergocalciferol 50k units, three times a week, with fatty foods to aide absorption -Continue Faslodex and Xgeva every 4 weeks    RTC with Dr. Irene Limbo in 2 months with labs  All of the patients questions were answered with apparent satisfaction. The patient knows to call the clinic with any problems, questions or concerns.   The total time spent in the appt was 25 minutes and more than 50% was on counseling and direct patient cares.    Sullivan Lone MD MS AAHIVMS [facility] Hematology/Oncology Physician [facility]  (Office):       [phone] (Work cell):  [phone] (Fax):           [phone]  I, Soijett Blue am acting as scribe for Dr. Sullivan Lone.  .I have reviewed the above documentation for accuracy and completeness, and I agree with the  above. [name] MD

## 2019-01-22 ENCOUNTER — Inpatient Hospital Stay: Payer: BC Managed Care – PPO | Attending: Hematology

## 2019-01-22 ENCOUNTER — Inpatient Hospital Stay: Payer: BC Managed Care – PPO

## 2019-01-22 ENCOUNTER — Telehealth: Payer: Self-pay | Admitting: Hematology

## 2019-01-22 ENCOUNTER — Inpatient Hospital Stay (HOSPITAL_BASED_OUTPATIENT_CLINIC_OR_DEPARTMENT_OTHER): Payer: BC Managed Care – PPO | Admitting: Hematology

## 2019-01-22 VITALS — BP 105/86 | HR 108 | Temp 98.2°F | Resp 18 | Ht 65.0 in | Wt 155.4 lb

## 2019-01-22 DIAGNOSIS — R569 Unspecified convulsions: Secondary | ICD-10-CM | POA: Diagnosis not present

## 2019-01-22 DIAGNOSIS — Z5111 Encounter for antineoplastic chemotherapy: Secondary | ICD-10-CM | POA: Diagnosis present

## 2019-01-22 DIAGNOSIS — C50919 Malignant neoplasm of unspecified site of unspecified female breast: Secondary | ICD-10-CM

## 2019-01-22 DIAGNOSIS — Z95828 Presence of other vascular implants and grafts: Secondary | ICD-10-CM

## 2019-01-22 DIAGNOSIS — C7931 Secondary malignant neoplasm of brain: Secondary | ICD-10-CM | POA: Diagnosis not present

## 2019-01-22 DIAGNOSIS — C7951 Secondary malignant neoplasm of bone: Secondary | ICD-10-CM

## 2019-01-22 DIAGNOSIS — C50812 Malignant neoplasm of overlapping sites of left female breast: Secondary | ICD-10-CM

## 2019-01-22 DIAGNOSIS — C787 Secondary malignant neoplasm of liver and intrahepatic bile duct: Secondary | ICD-10-CM | POA: Insufficient documentation

## 2019-01-22 DIAGNOSIS — E559 Vitamin D deficiency, unspecified: Secondary | ICD-10-CM | POA: Insufficient documentation

## 2019-01-22 DIAGNOSIS — E440 Moderate protein-calorie malnutrition: Secondary | ICD-10-CM

## 2019-01-22 DIAGNOSIS — G893 Neoplasm related pain (acute) (chronic): Secondary | ICD-10-CM | POA: Insufficient documentation

## 2019-01-22 DIAGNOSIS — Z170 Estrogen receptor positive status [ER+]: Secondary | ICD-10-CM

## 2019-01-22 DIAGNOSIS — C7800 Secondary malignant neoplasm of unspecified lung: Secondary | ICD-10-CM | POA: Insufficient documentation

## 2019-01-22 DIAGNOSIS — Z7189 Other specified counseling: Secondary | ICD-10-CM

## 2019-01-22 LAB — CBC WITH DIFFERENTIAL/PLATELET
Abs Immature Granulocytes: 0.02 10*3/uL (ref 0.00–0.07)
Basophils Absolute: 0 10*3/uL (ref 0.0–0.1)
Basophils Relative: 1 %
EOS ABS: 0.1 10*3/uL (ref 0.0–0.5)
Eosinophils Relative: 2 %
HCT: 44.9 % (ref 36.0–46.0)
Hemoglobin: 13.9 g/dL (ref 12.0–15.0)
IMMATURE GRANULOCYTES: 0 %
Lymphocytes Relative: 30 %
Lymphs Abs: 2 10*3/uL (ref 0.7–4.0)
MCH: 29.2 pg (ref 26.0–34.0)
MCHC: 31 g/dL (ref 30.0–36.0)
MCV: 94.3 fL (ref 80.0–100.0)
Monocytes Absolute: 0.6 10*3/uL (ref 0.1–1.0)
Monocytes Relative: 8 %
NEUTROS PCT: 59 %
Neutro Abs: 3.8 10*3/uL (ref 1.7–7.7)
Platelets: 189 10*3/uL (ref 150–400)
RBC: 4.76 MIL/uL (ref 3.87–5.11)
RDW: 14 % (ref 11.5–15.5)
WBC: 6.5 10*3/uL (ref 4.0–10.5)
nRBC: 0 % (ref 0.0–0.2)

## 2019-01-22 LAB — CMP (CANCER CENTER ONLY)
ALT: 18 U/L (ref 0–44)
AST: 28 U/L (ref 15–41)
Albumin: 3.4 g/dL — ABNORMAL LOW (ref 3.5–5.0)
Alkaline Phosphatase: 199 U/L — ABNORMAL HIGH (ref 38–126)
Anion gap: 12 (ref 5–15)
BUN: 5 mg/dL — ABNORMAL LOW (ref 8–23)
CO2: 22 mmol/L (ref 22–32)
Calcium: 7.9 mg/dL — ABNORMAL LOW (ref 8.9–10.3)
Chloride: 110 mmol/L (ref 98–111)
Creatinine: 0.74 mg/dL (ref 0.44–1.00)
GFR, Est AFR Am: >60 mL/min (ref >60)
GFR, Estimated: >60 mL/min (ref >60)
Glucose, Bld: 125 mg/dL — ABNORMAL HIGH (ref 70–99)
POTASSIUM: 3.5 mmol/L (ref 3.5–5.1)
Sodium: 144 mmol/L (ref 135–145)
Total Bilirubin: 0.5 mg/dL (ref 0.3–1.2)
Total Protein: 6.8 g/dL (ref 6.5–8.1)

## 2019-01-22 MED ORDER — DENOSUMAB 120 MG/1.7ML ~~LOC~~ SOLN
120.0000 mg | Freq: Once | SUBCUTANEOUS | Status: AC
  Administered 2019-01-22: 120 mg via SUBCUTANEOUS

## 2019-01-22 MED ORDER — FULVESTRANT 250 MG/5ML IM SOLN
INTRAMUSCULAR | Status: AC
  Filled 2019-01-22: qty 10

## 2019-01-22 MED ORDER — FULVESTRANT 250 MG/5ML IM SOLN
500.0000 mg | Freq: Once | INTRAMUSCULAR | Status: AC
  Administered 2019-01-22: 500 mg via INTRAMUSCULAR

## 2019-01-22 NOTE — Telephone Encounter (Signed)
Scheduled appt per 3/2 los.  Patient aware of appt dates and time.

## 2019-01-22 NOTE — Patient Instructions (Signed)
Fulvestrant injection What is this medicine? FULVESTRANT (ful VES trant) blocks the effects of estrogen. It is used to treat breast cancer. This medicine may be used for other purposes; ask your health care provider or pharmacist if you have questions. COMMON BRAND NAME(S): FASLODEX What should I tell my health care provider before I take this medicine? They need to know if you have any of these conditions: -bleeding problems -liver disease -low levels of platelets in the blood -an unusual or allergic reaction to fulvestrant, other medicines, foods, dyes, or preservatives -pregnant or trying to get pregnant -breast-feeding How should I use this medicine? This medicine is for injection into a muscle. It is usually given by a health care professional in a hospital or clinic setting. Talk to your pediatrician regarding the use of this medicine in children. Special care may be needed. Overdosage: If you think you have taken too much of this medicine contact a poison control center or emergency room at once. NOTE: This medicine is only for you. Do not share this medicine with others. What if I miss a dose? It is important not to miss your dose. Call your doctor or health care professional if you are unable to keep an appointment. What may interact with this medicine? -medicines that treat or prevent blood clots like warfarin, enoxaparin, and dalteparin This list may not describe all possible interactions. Give your health care provider a list of all the medicines, herbs, non-prescription drugs, or dietary supplements you use. Also tell them if you smoke, drink alcohol, or use illegal drugs. Some items may interact with your medicine. What should I watch for while using this medicine? Your condition will be monitored carefully while you are receiving this medicine. You will need important blood work done while you are taking this medicine. Do not become pregnant while taking this medicine or for  at least 1 year after stopping it. Women of child-bearing potential will need to have a negative pregnancy test before starting this medicine. Women should inform their doctor if they wish to become pregnant or think they might be pregnant. There is a potential for serious side effects to an unborn child. Men should inform their doctors if they wish to father a child. This medicine may lower sperm counts. Talk to your health care professional or pharmacist for more information. Do not breast-feed an infant while taking this medicine or for 1 year after the last dose. What side effects may I notice from receiving this medicine? Side effects that you should report to your doctor or health care professional as soon as possible: -allergic reactions like skin rash, itching or hives, swelling of the face, lips, or tongue -feeling faint or lightheaded, falls -pain, tingling, numbness, or weakness in the legs -signs and symptoms of infection like fever or chills; cough; flu-like symptoms; sore throat -vaginal bleeding Side effects that usually do not require medical attention (report to your doctor or health care professional if they continue or are bothersome): -aches, pains -constipation -diarrhea -headache -hot flashes -nausea, vomiting -pain at site where injected -stomach pain This list may not describe all possible side effects. Call your doctor for medical advice about side effects. You may report side effects to FDA at 1-800-FDA-1088. Where should I keep my medicine? This drug is given in a hospital or clinic and will not be stored at home. NOTE: This sheet is a summary. It may not cover all possible information. If you have questions about this medicine, talk to your   doctor, pharmacist, or health care provider.  2018 Elsevier/Gold Standard (2015-06-06 11:03:55) Denosumab injection What is this medicine? DENOSUMAB (den oh sue mab) slows bone breakdown. Prolia is used to treat osteoporosis in  women after menopause and in men. Delton See is used to treat a high calcium level due to cancer and to prevent bone fractures and other bone problems caused by multiple myeloma or cancer bone metastases. Delton See is also used to treat giant cell tumor of the bone. This medicine may be used for other purposes; ask your health care provider or pharmacist if you have questions. COMMON BRAND NAME(S): Prolia, XGEVA What should I tell my health care provider before I take this medicine? They need to know if you have any of these conditions: -dental disease -having surgery or tooth extraction -infection -kidney disease -low levels of calcium or Vitamin D in the blood -malnutrition -on hemodialysis -skin conditions or sensitivity -thyroid or parathyroid disease -an unusual reaction to denosumab, other medicines, foods, dyes, or preservatives -pregnant or trying to get pregnant -breast-feeding How should I use this medicine? This medicine is for injection under the skin. It is given by a health care professional in a hospital or clinic setting. If you are getting Prolia, a special MedGuide will be given to you by the pharmacist with each prescription and refill. Be sure to read this information carefully each time. For Prolia, talk to your pediatrician regarding the use of this medicine in children. Special care may be needed. For Delton See, talk to your pediatrician regarding the use of this medicine in children. While this drug may be prescribed for children as young as 13 years for selected conditions, precautions do apply. Overdosage: If you think you have taken too much of this medicine contact a poison control center or emergency room at once. NOTE: This medicine is only for you. Do not share this medicine with others. What if I miss a dose? It is important not to miss your dose. Call your doctor or health care professional if you are unable to keep an appointment. What may interact with this  medicine? Do not take this medicine with any of the following medications: -other medicines containing denosumab This medicine may also interact with the following medications: -medicines that lower your chance of fighting infection -steroid medicines like prednisone or cortisone This list may not describe all possible interactions. Give your health care provider a list of all the medicines, herbs, non-prescription drugs, or dietary supplements you use. Also tell them if you smoke, drink alcohol, or use illegal drugs. Some items may interact with your medicine. What should I watch for while using this medicine? Visit your doctor or health care professional for regular checks on your progress. Your doctor or health care professional may order blood tests and other tests to see how you are doing. Call your doctor or health care professional for advice if you get a fever, chills or sore throat, or other symptoms of a cold or flu. Do not treat yourself. This drug may decrease your body's ability to fight infection. Try to avoid being around people who are sick. You should make sure you get enough calcium and vitamin D while you are taking this medicine, unless your doctor tells you not to. Discuss the foods you eat and the vitamins you take with your health care professional. See your dentist regularly. Brush and floss your teeth as directed. Before you have any dental work done, tell your dentist you are receiving this medicine. Do  not become pregnant while taking this medicine or for 5 months after stopping it. Talk with your doctor or health care professional about your birth control options while taking this medicine. Women should inform their doctor if they wish to become pregnant or think they might be pregnant. There is a potential for serious side effects to an unborn child. Talk to your health care professional or pharmacist for more information. What side effects may I notice from receiving this  medicine? Side effects that you should report to your doctor or health care professional as soon as possible: -allergic reactions like skin rash, itching or hives, swelling of the face, lips, or tongue -bone pain -breathing problems -dizziness -jaw pain, especially after dental work -redness, blistering, peeling of the skin -signs and symptoms of infection like fever or chills; cough; sore throat; pain or trouble passing urine -signs of low calcium like fast heartbeat, muscle cramps or muscle pain; pain, tingling, numbness in the hands or feet; seizures -unusual bleeding or bruising -unusually weak or tired Side effects that usually do not require medical attention (report to your doctor or health care professional if they continue or are bothersome): -constipation -diarrhea -headache -joint pain -loss of appetite -muscle pain -runny nose -tiredness -upset stomach This list may not describe all possible side effects. Call your doctor for medical advice about side effects. You may report side effects to FDA at 1-800-FDA-1088. Where should I keep my medicine? This medicine is only given in a clinic, doctor's office, or other health care setting and will not be stored at home. NOTE: This sheet is a summary. It may not cover all possible information. If you have questions about this medicine, talk to your doctor, pharmacist, or health care provider.  2018 [employer]/Gold Standard (2016-11-30 19:17:21)  

## 2019-01-22 NOTE — Progress Notes (Signed)
Dr. Irene Limbo says its ok to give xgeva with low calcium today. Educating Pt on calcium during nursing visit

## 2019-01-23 LAB — CANCER ANTIGEN 15-3: CA 15-3: 27.2 U/mL — ABNORMAL HIGH (ref 0.0–25.0)

## 2019-01-23 LAB — CANCER ANTIGEN 27.29: CA 27.29: 33 U/mL (ref 0.0–38.6)

## 2019-01-23 NOTE — Progress Notes (Signed)
Disability paperwork successfully faxed to Shane Crutch at [phone]. Mailed copy to patient address on file.

## 2019-01-26 ENCOUNTER — Emergency Department (HOSPITAL_COMMUNITY): Payer: BC Managed Care – PPO

## 2019-01-26 ENCOUNTER — Observation Stay (HOSPITAL_COMMUNITY)
Admission: EM | Admit: 2019-01-26 | Discharge: 2019-01-28 | Disposition: A | Discharge status: Home / Self Care | Payer: BC Managed Care – PPO | Attending: Internal Medicine | Admitting: Internal Medicine

## 2019-01-26 ENCOUNTER — Other Ambulatory Visit: Payer: Self-pay

## 2019-01-26 DIAGNOSIS — C7931 Secondary malignant neoplasm of brain: Secondary | ICD-10-CM | POA: Diagnosis not present

## 2019-01-26 DIAGNOSIS — Z7982 Long term (current) use of aspirin: Secondary | ICD-10-CM | POA: Diagnosis not present

## 2019-01-26 DIAGNOSIS — Z79899 Other long term (current) drug therapy: Secondary | ICD-10-CM | POA: Insufficient documentation

## 2019-01-26 DIAGNOSIS — Z853 Personal history of malignant neoplasm of breast: Secondary | ICD-10-CM | POA: Insufficient documentation

## 2019-01-26 DIAGNOSIS — F17210 Nicotine dependence, cigarettes, uncomplicated: Secondary | ICD-10-CM | POA: Insufficient documentation

## 2019-01-26 DIAGNOSIS — R4701 Aphasia: Secondary | ICD-10-CM

## 2019-01-26 DIAGNOSIS — F329 Major depressive disorder, single episode, unspecified: Secondary | ICD-10-CM | POA: Diagnosis not present

## 2019-01-26 DIAGNOSIS — R112 Nausea with vomiting, unspecified: Secondary | ICD-10-CM | POA: Insufficient documentation

## 2019-01-26 DIAGNOSIS — C50919 Malignant neoplasm of unspecified site of unspecified female breast: Secondary | ICD-10-CM

## 2019-01-26 LAB — ETHANOL: Alcohol, Ethyl (B): <10 mg/dL (ref <10)

## 2019-01-26 LAB — DIFFERENTIAL
ABS IMMATURE GRANULOCYTES: 0.02 10*3/uL (ref 0.00–0.07)
BASOS PCT: 0 %
Basophils Absolute: 0 10*3/uL (ref 0.0–0.1)
Eosinophils Absolute: 0.1 10*3/uL (ref 0.0–0.5)
Eosinophils Relative: 1 %
Immature Granulocytes: 0 %
Lymphocytes Relative: 11 %
Lymphs Abs: 0.8 10*3/uL (ref 0.7–4.0)
Monocytes Absolute: 0.5 10*3/uL (ref 0.1–1.0)
Monocytes Relative: 7 %
NEUTROS PCT: 81 %
Neutro Abs: 5.7 10*3/uL (ref 1.7–7.7)

## 2019-01-26 LAB — COMPREHENSIVE METABOLIC PANEL
ALT: 26 U/L (ref 0–44)
AST: 56 U/L — ABNORMAL HIGH (ref 15–41)
Albumin: 3.2 g/dL — ABNORMAL LOW (ref 3.5–5.0)
Alkaline Phosphatase: 166 U/L — ABNORMAL HIGH (ref 38–126)
Anion gap: 10 (ref 5–15)
BUN: 9 mg/dL (ref 8–23)
CO2: 26 mmol/L (ref 22–32)
Calcium: 9.2 mg/dL (ref 8.9–10.3)
Chloride: 99 mmol/L (ref 98–111)
Creatinine, Ser: 0.72 mg/dL (ref 0.44–1.00)
GFR calc Af Amer: >60 mL/min (ref >60)
GFR calc non Af Amer: >60 mL/min (ref >60)
GLUCOSE: 196 mg/dL — AB (ref 70–99)
Potassium: 5 mmol/L (ref 3.5–5.1)
SODIUM: 135 mmol/L (ref 135–145)
Total Bilirubin: 1.3 mg/dL — ABNORMAL HIGH (ref 0.3–1.2)
Total Protein: 6.1 g/dL — ABNORMAL LOW (ref 6.5–8.1)

## 2019-01-26 LAB — PROTIME-INR
INR: 1.3 — ABNORMAL HIGH (ref 0.8–1.2)
Prothrombin Time: 15.8 seconds — ABNORMAL HIGH (ref 11.4–15.2)

## 2019-01-26 LAB — APTT: aPTT: 27 seconds (ref 24–36)

## 2019-01-26 LAB — CBC
HCT: 40.6 % (ref 36.0–46.0)
Hemoglobin: 13.3 g/dL (ref 12.0–15.0)
MCH: 29.9 pg (ref 26.0–34.0)
MCHC: 32.8 g/dL (ref 30.0–36.0)
MCV: 91.2 fL (ref 80.0–100.0)
Platelets: 167 10*3/uL (ref 150–400)
RBC: 4.45 MIL/uL (ref 3.87–5.11)
RDW: 14.2 % (ref 11.5–15.5)
WBC: 7.1 10*3/uL (ref 4.0–10.5)
nRBC: 0 % (ref 0.0–0.2)

## 2019-01-26 LAB — I-STAT CREATININE, ED: Creatinine, Ser: 0.6 mg/dL (ref 0.44–1.00)

## 2019-01-26 MED ORDER — ONDANSETRON HCL 4 MG/2ML IJ SOLN
4.0000 mg | Freq: Once | INTRAMUSCULAR | Status: AC
  Administered 2019-01-27: 4 mg via INTRAVENOUS
  Filled 2019-01-26: qty 2

## 2019-01-26 MED ORDER — SODIUM CHLORIDE 0.9 % IV BOLUS
1000.0000 mL | Freq: Once | INTRAVENOUS | Status: AC
  Administered 2019-01-27: 1000 mL via INTRAVENOUS

## 2019-01-26 NOTE — ED Notes (Signed)
Pt had earlier episode of expressive afaisia.  Pt able to express things appropriately at this time, only having some difficulty remembering events of the day.

## 2019-01-26 NOTE — ED Triage Notes (Signed)
Patient to ED via EMS with C/O expressive aphasia.  Patient last known normal at 2100.  EMS reports that patient vomited X 2 and then had difficulty forming her words.  Patient with a Hx of metastatic Ca.

## 2019-01-26 NOTE — ED Provider Notes (Signed)
[place] [facility] EMERGENCY DEPARTMENT Provider Note   CSN: [id number] Arrival date & time: 01/26/19  2238    History   Chief Complaint Chief Complaint  Patient presents with  . Aphasia    HPI Stacey Cox is a 63 y.o. female.     Patient with history of metastatic breast cancer to the brain, seizure disorder on Keppra, liver metastases presenting with episode of difficulty speaking that has since resolved.  Patient states she been having intermittent nausea and vomiting over the past 6 days and has had vomiting several times a day for the past week.  Today she had 3 episodes of vomiting at the end of the third episode she states she could not answer questions appropriately states that her age and her birth date or who the president was.  States she knew what the answers were but they would not come out.  Her friend at bedside states she was "speaking gibberish".  This lasted for about 10 minutes and has since resolved.  There is no focal weakness in her arms or legs.  No facial droop.  Her speech is back to baseline now and she is oriented x3.  There is no tongue biting or incontinence.  No reported seizure activity.  States compliance with her Keppra has not missed any doses.  She is not currently receiving any chemotherapy.  The history is provided by the patient and a friend.    Past Medical History:  Diagnosis Date  . Cancer [facility])    Metastatic Breast Cancer    Patient Active Problem List   Diagnosis Date Noted  . Seizures ([place]) 07/21/2018  . Macrocytic anemia 07/21/2018  . Thrombocytopenia ([place]) 07/21/2018  . Mild renal insufficiency 07/21/2018  . Anxiety 07/21/2018  . Brain metastases ([place])   . Malignant neoplasm of female breast ([place])   . Breast cancer metastasized to brain ([place]) 07/20/2018  . Port-A-Cath in place 07/17/2018  . Liver metastases ([place]) 07/12/2018  . Genetic testing 08/08/2017  . Palliative care status   . Counseling regarding advance  care planning and goals of care 03/25/2017  . 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 Nausea without vomiting 03/21/2017  . Bone metastases ([place]) 02/01/2017  . Osseous metastasis ([place])   . Malignant neoplasm metastatic to lung ([place])   . Cancer associated pain   . Metastatic breast cancer ([place]) 01/21/2017  . Pathologic fracture   . Breast mass, left     Past Surgical History:  Procedure Laterality Date  . CHOLECYSTECTOMY    . IR IMAGING GUIDED PORT INSERTION  07/14/2018  . TONSILLECTOMY       OB History   No obstetric history on file.      Home Medications    Prior to Admission medications   Medication Sig Start Date End Date Taking? Authorizing Provider  aspirin EC 81 MG tablet Take 1 tablet (81 mg total) by mouth daily. 08/01/17   [name], MD  chlorhexidine (PERIDEX) 0.12 % solution RINSE AND GARGLE 15 ML BY MOUTH OR THROAT TWICE DAILY AS DIRECTED AS NEEDED 11/30/18   [name], MD  dexamethasone (DECADRON) 2 MG tablet Take 0.5 tablets (1 mg total) by mouth daily. 11/07/18   Vaslow, Acey Lav, MD  Emollient (AQUAPHOR EX) Apply topically. 2 x day to lower legs    [provider]  escitalopram (LEXAPRO) 20 MG tablet TAKE 1 TABLET(20 MG) BY MOUTH DAILY 12/24/18   [name], MD  feeding supplement, ENSURE  ENLIVE, (ENSURE ENLIVE) LIQD Take 237 mLs by mouth 2 (two) times daily between meals. Patient taking differently: Take 237-474 mLs by mouth 2 (two) times daily between meals.  07/22/18   Raiford Noble Latif, DO  furosemide (LASIX) 20 MG tablet Take 1 tablet (20 mg total) by mouth daily. 09/08/18   [name], MD  hydrOXYzine (ATARAX/VISTARIL) 10 MG tablet Take 10 mg by mouth 2 (two) times daily. 01/01/19   [provider]  levETIRAcetam (KEPPRA) 750 MG tablet Take 1 tablet (750 mg total) by mouth 2 (two) times daily. 12/07/18   Ventura Sellers, MD  lidocaine-prilocaine (EMLA) cream Apply to affected area once  07/12/18   [name], MD  loperamide (IMODIUM) 1 MG/5ML solution Take 2 mg by mouth as needed for diarrhea or loose stools.     [provider]  LORazepam (ATIVAN) 0.5 MG tablet Take 1-2 tablets (0.5-1 mg total) by mouth every 8 (eight) hours as needed for anxiety (for anxiety and prior to CT/PET/MRI scan for anxiety/calustrophobia). 11/07/18   [name], MD  nystatin (MYCOSTATIN) 100000 UNIT/ML suspension Take 5 mLs (500,000 Units total) by mouth 4 (four) times daily. Patient not taking: Reported on 11/07/2018 10/02/18   Sandi Mealy E., PA-C  pantoprazole (PROTONIX) 40 MG tablet TAKE 1 TABLET(40 MG) BY MOUTH DAILY 01/09/19   [name], MD  potassium chloride (K-DUR) 10 MEQ tablet Take 1 tablet (10 mEq total) by mouth daily. Patient not taking: Reported on 10/10/2018 09/01/18   Lacretia Leigh, MD  potassium chloride SA (K-DUR,KLOR-CON) 20 MEQ tablet TAKE 1 TABLET(20 MEQ) BY MOUTH DAILY 01/07/19   [name], MD  Vitamin D, Ergocalciferol, (DRISDOL) 1.25 MG (50000 UT) CAPS capsule TAKE 1 CAPSULE BY MOUTH 3 TIMES WEEKLY 01/09/19   [name], MD    Family History Family History  Problem Relation Age of Onset  . Cancer Mother        breast  . Cancer Sister        breast    Social History Social History   Tobacco Use  . Smoking status: Current Every Day Smoker    Packs/day: 0.15    Types: Cigarettes    Last attempt to quit: 01/21/2017    Years since quitting: 2.0  . Smokeless tobacco: Never Used  Substance Use Topics  . Alcohol use: No    Alcohol/week: 0.0 standard drinks  . Drug use: No     Allergies   Thorazine [chlorpromazine] and Ribociclib   Review of Systems Review of Systems  Constitutional: Positive for activity change, appetite change and fatigue. Negative for fever.  HENT: Negative for congestion and rhinorrhea.   Respiratory: Negative for cough, chest tightness and shortness of breath.   Cardiovascular:  Negative for chest pain.  Gastrointestinal: Positive for nausea and vomiting.  Genitourinary: Negative for dysuria, hematuria, vaginal bleeding and vaginal discharge.  Musculoskeletal: Negative for arthralgias and myalgias.  Skin: Negative for rash.  Neurological: Positive for speech difficulty. Negative for dizziness and light-headedness.   all other systems are negative except as noted in the HPI and PMH.     Physical Exam Updated Vital Signs BP 131/88   Pulse 90   Resp 14   Ht 5\' 5"  (1.651 m)   Wt 70.5 kg   SpO2 97%   BMI 25.86 kg/m   Physical Exam Vitals signs and nursing note reviewed.  Constitutional:      General: She is not in acute distress.  Appearance: She is well-developed.  HENT:     Head: Normocephalic and atraumatic.     Nose: Nose normal. No rhinorrhea.     Mouth/Throat:     Mouth: Mucous membranes are dry.     Pharynx: No oropharyngeal exudate.  Eyes:     Conjunctiva/sclera: Conjunctivae normal.     Pupils: Pupils are equal, round, and reactive to light.  Neck:     Musculoskeletal: Normal range of motion and neck supple.     Comments: No meningismus. Cardiovascular:     Rate and Rhythm: Normal rate and regular rhythm.     Heart sounds: Normal heart sounds. No murmur.  Pulmonary:     Effort: Pulmonary effort is normal. No respiratory distress.     Breath sounds: Normal breath sounds.  Abdominal:     Palpations: Abdomen is soft.     Tenderness: There is no abdominal tenderness. There is no guarding or rebound.  Musculoskeletal: Normal range of motion.        General: No tenderness.  Skin:    General: Skin is warm.     Capillary Refill: Capillary refill takes less than 2 seconds.  Neurological:     General: No focal deficit present.     Mental Status: She is alert and oriented to person, place, and time. Mental status is at baseline.     Cranial Nerves: No cranial nerve deficit.     Motor: No abnormal muscle tone.     Coordination: Coordination  normal.     Comments: CN 2-12 intact, no ataxia on finger to nose, no nystagmus, 5/5 strength throughout, no pronator drift,  Oriented x3, answers questions appropriately.  Psychiatric:        Behavior: Behavior normal.      ED Treatments / Results  Labs (all labs ordered are listed, but only abnormal results are displayed) Labs Reviewed  PROTIME-INR - Abnormal; Notable for the following components:      Result Value   Prothrombin Time 15.8 (*)    INR 1.3 (*)    All other components within normal limits  COMPREHENSIVE METABOLIC PANEL - Abnormal; Notable for the following components:   Glucose, Bld 196 (*)    Total Protein 6.1 (*)    Albumin 3.2 (*)    AST 56 (*)    Alkaline Phosphatase 166 (*)    Total Bilirubin 1.3 (*)    All other components within normal limits  ETHANOL  APTT  CBC  DIFFERENTIAL  RAPID URINE DRUG SCREEN, HOSP PERFORMED  URINALYSIS, ROUTINE W REFLEX MICROSCOPIC  CBC  CBC WITH DIFFERENTIAL/PLATELET  I-STAT CREATININE, ED    EKG EKG Interpretation  Date/Time:  Friday January 26 2019 22:48:33 EST Ventricular Rate:  90 PR Interval:    QRS Duration: 78 QT Interval:  406 QTC Calculation: 497 R Axis:   -32 Text Interpretation:  Sinus rhythm Left axis deviation Low voltage, precordial leads Borderline prolonged QT interval No significant change was found Confirmed by Ezequiel Essex [phone]) on 01/26/2019 11:35:36 PM   Radiology Dg Chest 2 View  Result Date: 01/26/2019 CLINICAL DATA:  Altered mental status EXAM: CHEST - 2 VIEW COMPARISON:  01/21/2017, CT chest 12/01/2018 FINDINGS: Right-sided central venous port tip over the SVC. No acute airspace disease or effusion. Normal heart size. No pneumothorax. Degenerative changes of the spine. Sclerosis within the vertebral bodies. IMPRESSION: No active cardiopulmonary disease. Sclerosis of the vertebral bodies, corresponding to skeletal metastatic disease Electronically Signed   By: Madie Reno.D.  On:  01/26/2019 23:46   Ct Head Wo Contrast  Result Date: 01/27/2019 CLINICAL DATA:  Expressive aphasia, last seen normal at 2100 hours. History of metastatic breast cancer, status post whole brain chemo radiation. EXAM: CT HEAD WITHOUT CONTRAST TECHNIQUE: Contiguous axial images were obtained from the base of the skull through the vertex without intravenous contrast. COMPARISON:  MRI of the head October 23, 2018. FINDINGS: Mild motion degraded examination. BRAIN: LEFT > RIGHT frontal and LEFT temporal vasogenic edema with mass effect. 6 mm LEFT-to-RIGHT midline shift. Mild mass effect on LEFT ventricle without hydrocephalus or ventricular entrapment. No acute large vascular territory infarcts. Stable advanced cerebellar atrophy. Basal cisterns are patent. Soft tissue within LEFT frontoparietal extra-axial space consistent with dural metastasis seen on prior MRI. No abnormal extra-axial fluid collections. VASCULAR: Trace calcific atherosclerosis of the carotid siphons. SKULL: No skull fracture. No significant scalp soft tissue swelling. SINUSES/ORBITS: Moderate lobulatedthe included ocular globes and orbital contents are non-suspicious. OTHER: Calcified stylohyoid ligaments. IMPRESSION: 1. Mild motion degraded examination. 2. Worsening intracranial metastasis. New 6 mm LEFT-to-RIGHT midline shift without ventricular entrapment. 3. Similar advanced cerebellar atrophy. 4. Acute findings discussed with and reconfirmed by Dr.YELVERTON on 01/26/2019 at 12:14 am. Electronically Signed   By: Elon Alas M.D.   On: 01/27/2019 00:28    Procedures Procedures (including critical care time)  Medications Ordered in ED Medications  sodium chloride 0.9 % bolus 1,000 mL (has no administration in time range)  ondansetron (ZOFRAN) injection 4 mg (has no administration in time range)     Initial Impression / Assessment and Plan / ED Course  I have reviewed the triage vital signs and the nursing notes.  Pertinent labs  & imaging results that were available during my care of the patient were reviewed by me and considered in my medical decision making (see chart for details).       Patient with metastatic breast cancer including brain metastases with seizure disorder presenting with difficulty speaking and expressive aphasia that lasted for about 10 minutes and has since resolved.  She has no focal deficits at this time so a code stroke was not activated.   Discussed with Dr. Leonel Ramsay of neurology who reviewed patient's previous brain MRI.  Given the location of her metastasis, he favors partial seizure disorder rather than TIA. He agrees with CT head as well as CT angios head and neck to rule out carotid stenosis. States that increase Keppra to 1000 mg twice daily.  CT shows no hemorrhage.  Does show worsening brain metastasis with new 6 mm of midline shift.  Will speak with neurosurgery and started IV Decadron.  Discussed with Dr. Zada Finders of neurosurgery.  He agrees with IV Decadron 10 mg every 8 hours and admission to the hospitalist service as well as increasing her antiepileptic dose. He will be available for consult in the morning.   Patient given loading dose of IV Keppra.  CT angiogram is negative and shows no areas of carotid stenosis. She appears back to baseline at this time.  Admission for overnight observation discussed with Dr. Hal Hope. Final Clinical Impressions(s) / ED Diagnoses   Final diagnoses:  Expressive aphasia  Metastatic breast cancer [facility])    ED Discharge Orders    None       Laraine Samet, Annie Main, MD 01/27/19 [phone]

## 2019-01-27 ENCOUNTER — Emergency Department (HOSPITAL_COMMUNITY): Payer: BC Managed Care – PPO

## 2019-01-27 DIAGNOSIS — C50919 Malignant neoplasm of unspecified site of unspecified female breast: Secondary | ICD-10-CM | POA: Diagnosis not present

## 2019-01-27 DIAGNOSIS — C7931 Secondary malignant neoplasm of brain: Secondary | ICD-10-CM | POA: Diagnosis present

## 2019-01-27 MED ORDER — ESCITALOPRAM OXALATE 10 MG PO TABS
20.0000 mg | ORAL_TABLET | Freq: Every day | ORAL | Status: DC
  Administered 2019-01-27 – 2019-01-28 (×2): 20 mg via ORAL
  Filled 2019-01-27 (×2): qty 2

## 2019-01-27 MED ORDER — ENOXAPARIN SODIUM 40 MG/0.4ML ~~LOC~~ SOLN
40.0000 mg | SUBCUTANEOUS | Status: DC
  Administered 2019-01-27: 40 mg via SUBCUTANEOUS
  Filled 2019-01-27: qty 0.4

## 2019-01-27 MED ORDER — ACETAMINOPHEN 325 MG PO TABS: 650.0000 mg | ORAL_TABLET | Freq: Four times a day (QID) | ORAL | Status: DC | PRN

## 2019-01-27 MED ORDER — ONDANSETRON HCL 4 MG PO TABS: 4.0000 mg | ORAL_TABLET | Freq: Four times a day (QID) | ORAL | Status: DC | PRN

## 2019-01-27 MED ORDER — PANTOPRAZOLE SODIUM 40 MG PO TBEC
40.0000 mg | DELAYED_RELEASE_TABLET | Freq: Every day | ORAL | Status: DC
  Administered 2019-01-27 – 2019-01-28 (×2): 40 mg via ORAL
  Filled 2019-01-27 (×2): qty 1

## 2019-01-27 MED ORDER — ACETAMINOPHEN 650 MG RE SUPP: 650.0000 mg | Freq: Four times a day (QID) | RECTAL | Status: DC | PRN

## 2019-01-27 MED ORDER — LEVETIRACETAM IN NACL 1000 MG/100ML IV SOLN
1000.0000 mg | Freq: Two times a day (BID) | INTRAVENOUS | Status: DC
  Administered 2019-01-27 – 2019-01-28 (×3): 1000 mg via INTRAVENOUS
  Filled 2019-01-27 (×4): qty 100

## 2019-01-27 MED ORDER — LEVETIRACETAM IN NACL 1000 MG/100ML IV SOLN
1000.0000 mg | Freq: Once | INTRAVENOUS | Status: AC
  Administered 2019-01-27: 1000 mg via INTRAVENOUS
  Filled 2019-01-27: qty 100

## 2019-01-27 MED ORDER — IOHEXOL 350 MG/ML SOLN
75.0000 mL | Freq: Once | INTRAVENOUS | Status: AC | PRN
  Administered 2019-01-27: 75 mL via INTRAVENOUS

## 2019-01-27 MED ORDER — FUROSEMIDE 20 MG PO TABS: 20.0000 mg | ORAL_TABLET | Freq: Every day | ORAL | Status: DC

## 2019-01-27 MED ORDER — HYDROXYZINE HCL 10 MG PO TABS
10.0000 mg | ORAL_TABLET | Freq: Two times a day (BID) | ORAL | Status: DC
  Administered 2019-01-27 – 2019-01-28 (×3): 10 mg via ORAL
  Filled 2019-01-27 (×3): qty 1

## 2019-01-27 MED ORDER — POTASSIUM CHLORIDE CRYS ER 20 MEQ PO TBCR: 20.0000 meq | EXTENDED_RELEASE_TABLET | Freq: Every day | ORAL | Status: DC

## 2019-01-27 MED ORDER — ASPIRIN EC 81 MG PO TBEC
81.0000 mg | DELAYED_RELEASE_TABLET | Freq: Every day | ORAL | Status: DC
  Administered 2019-01-27 – 2019-01-28 (×2): 81 mg via ORAL
  Filled 2019-01-27 (×2): qty 1

## 2019-01-27 MED ORDER — SODIUM CHLORIDE 0.9 % IV SOLN
INTRAVENOUS | Status: DC | PRN
  Administered 2019-01-27: 250 mL via INTRAVENOUS

## 2019-01-27 MED ORDER — DEXAMETHASONE SODIUM PHOSPHATE 10 MG/ML IJ SOLN
10.0000 mg | Freq: Once | INTRAMUSCULAR | Status: AC
  Administered 2019-01-27: 10 mg via INTRAVENOUS
  Filled 2019-01-27: qty 1

## 2019-01-27 MED ORDER — DEXAMETHASONE SODIUM PHOSPHATE 10 MG/ML IJ SOLN
4.0000 mg | Freq: Four times a day (QID) | INTRAMUSCULAR | Status: DC
  Administered 2019-01-27 – 2019-01-28 (×6): 4 mg via INTRAVENOUS
  Filled 2019-01-27 (×6): qty 1

## 2019-01-27 MED ORDER — SODIUM CHLORIDE 0.9% FLUSH
10.0000 mL | INTRAVENOUS | Status: DC | PRN
  Administered 2019-01-27: 20 mL

## 2019-01-27 MED ORDER — ONDANSETRON HCL 4 MG/2ML IJ SOLN: 4.0000 mg | Freq: Four times a day (QID) | INTRAMUSCULAR | Status: DC | PRN

## 2019-01-27 NOTE — ED Notes (Signed)
Pt's Monitor Alarming. Pt's O2 Sat 84% on 2L. Pt was at the time sleeping. Put Pt on 3L O2.

## 2019-01-27 NOTE — ED Notes (Addendum)
Friend Teresa Richel Can Pick Up Pt If She Gets Discharged. [phone].

## 2019-01-27 NOTE — H&P (Signed)
History and Physical    Stacey Cox JHE:[id number] DOB: [birth year] DOA: 01/26/2019  PCP: Patient, No Pcp Per  Patient coming from: Home.  Chief Complaint: Aphasia.  HPI: Stacey Cox is a 63 y.o. female with history of metastatic breast cancer presents to the ER after patient was witnessed to have brief period of difficulty speaking around 9 PM last night witnessed by family.  Lasted for around 10 minutes.  Has no headache nausea vomiting visual symptoms or difficulty moving extremities.  Previously used to be on Decadron for metastatic brain lesions.  ED Course: In the ER on exam patient appears nonfocal.  CT head shows worsening metastatic brain lesion with new left to right shift 6 mm.  ER physician had discussed with on-call neurologist Dr. Leonel Ramsay who advised patient to be started on Decadron 10 mg IV which was given along with increasing her Keppra dose.  If CT angiogram of the head and neck was negative for any vascular occlusion the symptoms are likely from seizure and patient's brain mets as per the neurologist.  CT angiogram of the head and neck was negative for large vessel occlusion.  Review of Systems: As per HPI, rest all negative.   Past Medical History:  Diagnosis Date  . Cancer [facility])    Metastatic Breast Cancer    Past Surgical History:  Procedure Laterality Date  . CHOLECYSTECTOMY    . IR IMAGING GUIDED PORT INSERTION  07/14/2018  . TONSILLECTOMY       reports that she has been smoking cigarettes. She has been smoking about 0.15 packs per day. She has never used smokeless tobacco. She reports that she does not drink alcohol or use drugs.  Allergies  Allergen Reactions  . Thorazine [Chlorpromazine] Anaphylaxis  . Ribociclib Other (See Comments)    Exfoliative dermatitis    Family History  Problem Relation Age of Onset  . Cancer Mother        breast  . Cancer Sister        breast    Prior to Admission medications   Medication Sig Start Date End  Date Taking? Authorizing Provider  aspirin EC 81 MG tablet Take 1 tablet (81 mg total) by mouth daily. 08/01/17  Yes [name], MD  chlorhexidine (PERIDEX) 0.12 % solution RINSE AND GARGLE 15 ML BY MOUTH OR THROAT TWICE DAILY AS DIRECTED AS NEEDED Patient taking differently: Use as directed 15 mLs in the mouth or throat 2 (two) times daily as needed (mouth rinse after vomiting). Swish and spit 11/30/18  Yes [name], MD  escitalopram (LEXAPRO) 20 MG tablet TAKE 1 TABLET(20 MG) BY MOUTH DAILY Patient taking differently: Take 20 mg by mouth daily.  12/24/18  Yes [name], MD  feeding supplement, ENSURE ENLIVE, (ENSURE ENLIVE) LIQD Take 237 mLs by mouth 2 (two) times daily between meals. Patient taking differently: Take 237 mLs by mouth daily.  07/22/18  Yes Sheikh, Omair Latif, DO  furosemide (LASIX) 20 MG tablet Take 1 tablet (20 mg total) by mouth daily. 09/08/18  Yes [name], MD  hydrOXYzine (ATARAX/VISTARIL) 10 MG tablet Take 10 mg by mouth 2 (two) times daily. 01/01/19  Yes [provider]  levETIRAcetam (KEPPRA) 750 MG tablet Take 1 tablet (750 mg total) by mouth 2 (two) times daily. 12/07/18  Yes Vaslow, Acey Lav, MD  pantoprazole (PROTONIX) 40 MG tablet TAKE 1 TABLET(40 MG) BY MOUTH DAILY Patient taking differently: Take 40 mg by mouth daily.  01/09/19  Yes [name], MD  potassium chloride SA (K-DUR,KLOR-CON) 20 MEQ tablet TAKE 1 TABLET(20 MEQ) BY MOUTH DAILY Patient taking differently: Take 20 mEq by mouth daily.  01/07/19  Yes [name], MD  Vitamin D, Ergocalciferol, (DRISDOL) 1.25 MG (50000 UT) CAPS capsule TAKE 1 CAPSULE BY MOUTH 3 TIMES WEEKLY Patient taking differently: Take 50,000 Units by mouth See admin instructions. Take one capsule (50,000 units) by mouth 3 times week - every Monday, Tuesday, Wednesday 01/09/19  Yes [name], MD  lidocaine-prilocaine (EMLA) cream Apply to affected area once Patient  not taking: Reported on 01/26/2019 07/12/18   [name], MD  LORazepam (ATIVAN) 0.5 MG tablet Take 1-2 tablets (0.5-1 mg total) by mouth every 8 (eight) hours as needed for anxiety (for anxiety and prior to CT/PET/MRI scan for anxiety/calustrophobia). Patient not taking: Reported on 01/26/2019 11/07/18   [name], MD  nystatin (MYCOSTATIN) 100000 UNIT/ML suspension Take 5 mLs (500,000 Units total) by mouth 4 (four) times daily. Patient not taking: Reported on 11/07/2018 10/02/18   Sandi Mealy E., PA-C  potassium chloride (K-DUR) 10 MEQ tablet Take 1 tablet (10 mEq total) by mouth daily. Patient not taking: Reported on 10/10/2018 09/01/18   Lacretia Leigh, MD    Physical Exam: Vitals:   01/27/19 0015 01/27/19 0100 01/27/19 0115 01/27/19 0130  BP: (!) 137/100 123/88 121/73 (!) 149/78  Pulse: 88  70 100  Resp: 17  15   SpO2: 99%  100% 98%  Weight:      Height:          Constitutional: Moderately built and nourished. Vitals:   01/27/19 0015 01/27/19 0100 01/27/19 0115 01/27/19 0130  BP: (!) 137/100 123/88 121/73 (!) 149/78  Pulse: 88  70 100  Resp: 17  15   SpO2: 99%  100% 98%  Weight:      Height:       Eyes: Anicteric no pallor. ENMT: No discharge from the ears eyes nose and mouth. Neck: No mass felt.  No neck rigidity. Respiratory: No rhonchi or crepitations. Cardiovascular: S1-S2 heard. Abdomen: Soft nontender bowel sounds present. Musculoskeletal: No edema. Skin: No rash. Neurologic: Alert awake oriented to time place and person.  Moves all extremities 5 x 5.  No facial asymmetry tongue is midline.  Pupils are equal and reacting to light. Psychiatric: Appears normal per normal affect.   Labs on Admission: I have personally reviewed following labs and imaging studies  CBC: Recent Labs  Lab 01/22/19 0949 01/26/19 2254  WBC 6.5 7.1  NEUTROABS 3.8 5.7  HGB 13.9 13.3  HCT 44.9 40.6  MCV 94.3 91.2  PLT 189 154   Basic Metabolic Panel: Recent  Labs  Lab 01/22/19 0949 01/26/19 2254 01/26/19 2325  NA 144 135  --   K 3.5 5.0  --   CL 110 99  --   CO2 22 26  --   GLUCOSE 125* 196*  --   BUN 5* 9  --   CREATININE 0.74 0.72 0.60  CALCIUM 7.9* 9.2  --    GFR: Estimated Creatinine Clearance: 70.9 mL/min (by C-G formula based on SCr of 0.6 mg/dL). Liver Function Tests: Recent Labs  Lab 01/22/19 0949 01/26/19 2254  AST 28 56*  ALT 18 26  ALKPHOS 199* 166*  BILITOT 0.5 1.3*  PROT 6.8 6.1*  ALBUMIN 3.4* 3.2*   No results for input(s): LIPASE, AMYLASE in the last 168 hours. No results for input(s): AMMONIA in the last  168 hours. Coagulation Profile: Recent Labs  Lab 01/26/19 2254  INR 1.3*   Cardiac Enzymes: No results for input(s): CKTOTAL, CKMB, CKMBINDEX, TROPONINI in the last 168 hours. BNP (last 3 results) No results for input(s): PROBNP in the last 8760 hours. HbA1C: No results for input(s): HGBA1C in the last 72 hours. CBG: No results for input(s): GLUCAP in the last 168 hours. Lipid Profile: No results for input(s): CHOL, HDL, LDLCALC, TRIG, CHOLHDL, LDLDIRECT in the last 72 hours. Thyroid Function Tests: No results for input(s): TSH, T4TOTAL, FREET4, T3FREE, THYROIDAB in the last 72 hours. Anemia Panel: No results for input(s): VITAMINB12, FOLATE, FERRITIN, TIBC, IRON, RETICCTPCT in the last 72 hours. Urine analysis:    Component Value Date/Time   COLORURINE COLORLESS (A) 01/31/2017 1324   APPEARANCEUR CLEAR 01/31/2017 1324   LABSPEC 1.002 (L) 01/31/2017 1324   PHURINE 5.0 01/31/2017 1324   GLUCOSEU NEGATIVE 01/31/2017 1324   HGBUR NEGATIVE 01/31/2017 1324   BILIRUBINUR NEGATIVE 01/31/2017 1324   KETONESUR NEGATIVE 01/31/2017 1324   PROTEINUR NEGATIVE 01/31/2017 1324   NITRITE NEGATIVE 01/31/2017 1324   LEUKOCYTESUR NEGATIVE 01/31/2017 1324   Sepsis Labs: @LABRCNTIP (procalcitonin:4,lacticidven:4) )No results found for this or any previous visit (from the past 240 hour(s)).   Radiological  Exams on Admission: Ct Angio Head W Or Wo Contrast  Result Date: 01/27/2019 CLINICAL DATA:  Expressive aphasia, vomiting. Known worsening metastatic breast cancer. EXAM: CT ANGIOGRAPHY HEAD AND NECK TECHNIQUE: Multidetector CT imaging of the head and neck was performed using the standard protocol during bolus administration of intravenous contrast. Multiplanar CT image reconstructions and MIPs were obtained to evaluate the vascular anatomy. Carotid stenosis measurements (when applicable) are obtained utilizing NASCET criteria, using the distal internal carotid diameter as the denominator. CONTRAST:  34mL OMNIPAQUE IOHEXOL 350 MG/ML SOLN COMPARISON:  CT HEAD January 26, 2019 and MRI head October 23, 2018 FINDINGS: CTA NECK FINDINGS: AORTIC ARCH: Normal appearance of the thoracic arch, normal branch pattern. Trace calcific atherosclerosis. The origins of the innominate, left Common carotid artery and subclavian artery are patent. RIGHT CAROTID SYSTEM: Common carotid artery is patent. Mild intimal thickening of the carotid bifurcation without hemodynamically significant stenosis by NASCET criteria. Normal appearance of the internal carotid artery. LEFT CAROTID SYSTEM: Common carotid artery is patent. Normal appearance of the carotid bifurcation without hemodynamically significant stenosis by NASCET criteria. Normal appearance of the internal carotid artery. VERTEBRAL ARTERIES:Left vertebral artery is dominant. Normal appearance of the vertebral arteries, widely patent. SKELETON: No acute osseous process though bone windows have not been submitted. Numerous sclerotic metastasis throughout the included axial skeleton. OTHER NECK: Soft tissues of the neck are nonacute though, not tailored for evaluation. UPPER CHEST: Included lung apices are clear. No superior mediastinal lymphadenopathy. RIGHT chest Port-A-Cath. CTA HEAD FINDINGS: ANTERIOR CIRCULATION: Patent cervical internal carotid arteries, petrous, cavernous and  supra clinoid internal carotid arteries. Patent anterior communicating artery. Patent anterior and middle cerebral arteries. No large vessel occlusion, flow-limiting stenosis, contrast extravasation or aneurysm. POSTERIOR CIRCULATION: Patent vertebral arteries, vertebrobasilar junction and basilar artery, as well as main branch vessels. Patent posterior cerebral arteries. Bilateral posterior communicating arteries present. No large vessel occlusion, flow-limiting stenosis, contrast extravasation or aneurysm. VENOUS SINUSES: Major dural venous sinuses are patent though not tailored for evaluation on this angiographic examination. ANATOMIC VARIANTS: None. DELAYED PHASE: Worsening 1.9 x 3.4 cm LEFT frontotemporal dural metastasis. New 16 mm LEFT parafalcine dural versus parenchymal metastasis. Worsening LEFT frontal convexity dural metastasis measuring to 10 mm in transaxial dimension. New  1 cm RIGHT frontal convexity dural versus intraparenchymal metastasis. Stable 13 mm RIGHT posterior fossa extra-axial probable meningioma. MIP images reviewed. IMPRESSION: CTA NECK: 1. No acute vascular process. 2. No hemodynamically significant stenosis ICA's. Patent vertebral arteries. 3. Diffuse osseous metastasis. CTA HEAD: 1. No emergent large vessel occlusion or flow-limiting stenosis. 2. Worsening predominantly dural-based metastasis. Electronically Signed   By: Elon Alas M.D.   On: 01/27/2019 01:40   Dg Chest 2 View  Result Date: 01/26/2019 CLINICAL DATA:  Altered mental status EXAM: CHEST - 2 VIEW COMPARISON:  01/21/2017, CT chest 12/01/2018 FINDINGS: Right-sided central venous port tip over the SVC. No acute airspace disease or effusion. Normal heart size. No pneumothorax. Degenerative changes of the spine. Sclerosis within the vertebral bodies. IMPRESSION: No active cardiopulmonary disease. Sclerosis of the vertebral bodies, corresponding to skeletal metastatic disease Electronically Signed   By: Donavan Foil  M.D.   On: 01/26/2019 23:46   Ct Head Wo Contrast  Result Date: 01/27/2019 CLINICAL DATA:  Expressive aphasia, last seen normal at 2100 hours. History of metastatic breast cancer, status post whole brain chemo radiation. EXAM: CT HEAD WITHOUT CONTRAST TECHNIQUE: Contiguous axial images were obtained from the base of the skull through the vertex without intravenous contrast. COMPARISON:  MRI of the head October 23, 2018. FINDINGS: Mild motion degraded examination. BRAIN: LEFT > RIGHT frontal and LEFT temporal vasogenic edema with mass effect. 6 mm LEFT-to-RIGHT midline shift. Mild mass effect on LEFT ventricle without hydrocephalus or ventricular entrapment. No acute large vascular territory infarcts. Stable advanced cerebellar atrophy. Basal cisterns are patent. Soft tissue within LEFT frontoparietal extra-axial space consistent with dural metastasis seen on prior MRI. No abnormal extra-axial fluid collections. VASCULAR: Trace calcific atherosclerosis of the carotid siphons. SKULL: No skull fracture. No significant scalp soft tissue swelling. SINUSES/ORBITS: Moderate lobulatedthe included ocular globes and orbital contents are non-suspicious. OTHER: Calcified stylohyoid ligaments. IMPRESSION: 1. Mild motion degraded examination. 2. Worsening intracranial metastasis. New 6 mm LEFT-to-RIGHT midline shift without ventricular entrapment. 3. Similar advanced cerebellar atrophy. 4. Acute findings discussed with and reconfirmed by Dr.YELVERTON on 01/26/2019 at 12:14 am. Electronically Signed   By: Elon Alas M.D.   On: 01/27/2019 00:28   Ct Angio Neck W And/or Wo Contrast  Result Date: 01/27/2019 CLINICAL DATA:  Expressive aphasia, vomiting. Known worsening metastatic breast cancer. EXAM: CT ANGIOGRAPHY HEAD AND NECK TECHNIQUE: Multidetector CT imaging of the head and neck was performed using the standard protocol during bolus administration of intravenous contrast. Multiplanar CT image reconstructions and MIPs  were obtained to evaluate the vascular anatomy. Carotid stenosis measurements (when applicable) are obtained utilizing NASCET criteria, using the distal internal carotid diameter as the denominator. CONTRAST:  17mL OMNIPAQUE IOHEXOL 350 MG/ML SOLN COMPARISON:  CT HEAD January 26, 2019 and MRI head October 23, 2018 FINDINGS: CTA NECK FINDINGS: AORTIC ARCH: Normal appearance of the thoracic arch, normal branch pattern. Trace calcific atherosclerosis. The origins of the innominate, left Common carotid artery and subclavian artery are patent. RIGHT CAROTID SYSTEM: Common carotid artery is patent. Mild intimal thickening of the carotid bifurcation without hemodynamically significant stenosis by NASCET criteria. Normal appearance of the internal carotid artery. LEFT CAROTID SYSTEM: Common carotid artery is patent. Normal appearance of the carotid bifurcation without hemodynamically significant stenosis by NASCET criteria. Normal appearance of the internal carotid artery. VERTEBRAL ARTERIES:Left vertebral artery is dominant. Normal appearance of the vertebral arteries, widely patent. SKELETON: No acute osseous process though bone windows have not been submitted. Numerous sclerotic metastasis throughout  the included axial skeleton. OTHER NECK: Soft tissues of the neck are nonacute though, not tailored for evaluation. UPPER CHEST: Included lung apices are clear. No superior mediastinal lymphadenopathy. RIGHT chest Port-A-Cath. CTA HEAD FINDINGS: ANTERIOR CIRCULATION: Patent cervical internal carotid arteries, petrous, cavernous and supra clinoid internal carotid arteries. Patent anterior communicating artery. Patent anterior and middle cerebral arteries. No large vessel occlusion, flow-limiting stenosis, contrast extravasation or aneurysm. POSTERIOR CIRCULATION: Patent vertebral arteries, vertebrobasilar junction and basilar artery, as well as main branch vessels. Patent posterior cerebral arteries. Bilateral posterior  communicating arteries present. No large vessel occlusion, flow-limiting stenosis, contrast extravasation or aneurysm. VENOUS SINUSES: Major dural venous sinuses are patent though not tailored for evaluation on this angiographic examination. ANATOMIC VARIANTS: None. DELAYED PHASE: Worsening 1.9 x 3.4 cm LEFT frontotemporal dural metastasis. New 16 mm LEFT parafalcine dural versus parenchymal metastasis. Worsening LEFT frontal convexity dural metastasis measuring to 10 mm in transaxial dimension. New 1 cm RIGHT frontal convexity dural versus intraparenchymal metastasis. Stable 13 mm RIGHT posterior fossa extra-axial probable meningioma. MIP images reviewed. IMPRESSION: CTA NECK: 1. No acute vascular process. 2. No hemodynamically significant stenosis ICA's. Patent vertebral arteries. 3. Diffuse osseous metastasis. CTA HEAD: 1. No emergent large vessel occlusion or flow-limiting stenosis. 2. Worsening predominantly dural-based metastasis. Electronically Signed   By: Elon Alas M.D.   On: 01/27/2019 01:40    EKG: Independently reviewed.  Normal sinus rhythm.  Assessment/Plan Active Problems:   Brain metastasis ([place])    1. Worsening brain mets with known history of metastatic breast cancer with aphasic spell likely could be from possible seizure.  I have discussed with on-call neurology Dr. Leonel Ramsay who advised to keep patient on Decadron 4 mg IV every 6 after getting the 10 mg initial dose.  Increase Keppra thousand milligrams every 12.  To discuss with patient's neuro oncologist Dr. Mickeal Skinner in the morning for further recommendations. 2. History of depression on Lexapro.   DVT prophylaxis: Lovenox. Code Status: Full code. Family Communication: Discussed with patient. Disposition Plan: Home. Consults called: Discussed with neurologist. Admission status: Observation.   Rise Patience MD [facility] Pager [phone].  If 7PM-7AM, please contact  night-coverage [email or web address] Password [email or web address]  01/27/2019, 4:53 AM

## 2019-01-27 NOTE — ED Notes (Signed)
Pt ambulated to Bathroom with no assistance. Once Pt returned her O2 was 85%. Pt stated she was not short of breath. RN-Andrew also present. Put 2L O2 on Pt. Pt speech also appears to be worse than when Pt arrived.

## 2019-01-27 NOTE — Plan of Care (Addendum)
63 year old female with a history of metastatic breast cancer with admitted with aphasia.  She was sleeping when I saw her this morning I was able to wake her up and she was able to answer all my questions appropriately aphasia seems to have improved.  She received 1 dose of Decadron 10 mg in the ER and placed on 4 mg every 6.   Keppra dose has been increased to thousand milligrams twice a day from 750 twice a day.  Discussed with Dr. Mickeal Skinner with neuro-oncology suggested continuing the current dose of Decadron and Keppra and discharging her on Decadron 4 mg daily along with Keppra 1 g twice a day.  And she will follow-up with Dr. Mickeal Skinner who would taper the Decadron.

## 2019-01-28 ENCOUNTER — Other Ambulatory Visit: Payer: Self-pay | Admitting: Hematology

## 2019-01-28 DIAGNOSIS — R4701 Aphasia: Secondary | ICD-10-CM

## 2019-01-28 DIAGNOSIS — C50919 Malignant neoplasm of unspecified site of unspecified female breast: Secondary | ICD-10-CM | POA: Diagnosis not present

## 2019-01-28 DIAGNOSIS — C7931 Secondary malignant neoplasm of brain: Secondary | ICD-10-CM | POA: Diagnosis not present

## 2019-01-28 LAB — URINALYSIS, ROUTINE W REFLEX MICROSCOPIC
BILIRUBIN URINE: NEGATIVE
Glucose, UA: NEGATIVE mg/dL
HGB URINE DIPSTICK: NEGATIVE
Ketones, ur: NEGATIVE mg/dL
Leukocytes,Ua: NEGATIVE
NITRITE: NEGATIVE
Protein, ur: NEGATIVE mg/dL
Specific Gravity, Urine: 1.004 — ABNORMAL LOW (ref 1.005–1.030)
pH: 7 (ref 5.0–8.0)

## 2019-01-28 LAB — RAPID URINE DRUG SCREEN, HOSP PERFORMED
Amphetamines: NOT DETECTED
BARBITURATES: NOT DETECTED
Benzodiazepines: NOT DETECTED
Cocaine: NOT DETECTED
Opiates: NOT DETECTED
Tetrahydrocannabinol: NOT DETECTED

## 2019-01-28 MED ORDER — DEXAMETHASONE 4 MG PO TABS: 4.0000 mg | ORAL_TABLET | Freq: Every day | ORAL | 0 refills | Status: DC

## 2019-01-28 MED ORDER — LEVETIRACETAM 1000 MG PO TABS: 1000.0000 mg | ORAL_TABLET | Freq: Two times a day (BID) | ORAL | 0 refills | Status: DC

## 2019-01-28 NOTE — Discharge Summary (Signed)
Physician Discharge Summary  Stacey Cox TDD:[id number] DOB: 1956-08-03 DOA: 01/26/2019  PCP: Patient, No Pcp Per  Admit date: 01/26/2019 Discharge date: 01/28/2019  Admitted From:home Disposition:home  Recommendations for Outpatient Follow-up:  1. Follow up with PCP in 1-2 weeks 2. Please obtain BMP/CBC in one week 3. Please follow up with dr Mickeal Skinner  Home Health:none Equipment/Devices:none Discharge Conditionstable CODE STATUS:full Diet recommendation: cardiac Brief/Interim Summary:63 y.o. female with history of metastatic breast cancer presents to the ER after patient was witnessed to have brief period of difficulty speaking around 9 PM last night witnessed by family.  Lasted for around 10 minutes.  Has no headache nausea vomiting visual symptoms or difficulty moving extremities.  Previously used to be on Decadron for metastatic brain lesions.  ED Course: In the ER on exam patient appears nonfocal.  CT head shows worsening metastatic brain lesion with new left to right shift 6 mm.  ER physician had discussed with on-call neurologist Dr. Leonel Ramsay who advised patient to be started on Decadron 10 mg IV which was given along with increasing her Keppra dose.  If CT angiogram of the head and neck was negative for any vascular occlusion the symptoms are likely from seizure and patient's brain mets as per the neurologist.  CT angiogram of the head and neck was negative for large vessel occlusion. Discharge Diagnoses:  Active Problems:   Brain metastasis ([place])    1)possible seizure she was treated with decadron and higher dose of keppra.she did not have any further seizures since admit to hospital.she will be discharged on keppra 1000 mg bid and decadron 4 mg daily .she will follow up with dr Mickeal Skinner.she has metastatic breast ca with mets to brain.  2)depression continue lexapro    Estimated body mass index is 25.86 kg/m as calculated from the following:   Height as of this encounter: 5'  5" (1.651 m).   Weight as of this encounter: 70.5 kg.  Discharge Instructions  Discharge Instructions    Call MD for:  difficulty breathing, headache or visual disturbances   Complete by:  As directed    Call MD for:  persistant nausea and vomiting   Complete by:  As directed    Call MD for:  redness, tenderness, or signs of infection (pain, swelling, redness, odor or green/yellow discharge around incision site)   Complete by:  As directed    Diet - low sodium heart healthy   Complete by:  As directed    Increase activity slowly   Complete by:  As directed      Allergies as of 01/28/2019      Reactions   Thorazine [chlorpromazine] Anaphylaxis   Ribociclib Other (See Comments)   Exfoliative dermatitis      Medication List    STOP taking these medications   furosemide 20 MG tablet Commonly known as:  LASIX   lidocaine-prilocaine cream Commonly known as:  EMLA   LORazepam 0.5 MG tablet Commonly known as:  ATIVAN   nystatin 100000 UNIT/ML suspension Commonly known as:  MYCOSTATIN   potassium chloride 10 MEQ tablet Commonly known as:  K-DUR   potassium chloride SA 20 MEQ tablet Commonly known as:  K-DUR,KLOR-CON     TAKE these medications   aspirin EC 81 MG tablet Take 1 tablet (81 mg total) by mouth daily.   chlorhexidine 0.12 % solution Commonly known as:  PERIDEX RINSE AND GARGLE 15 ML BY MOUTH OR THROAT TWICE DAILY AS DIRECTED AS NEEDED What changed:  See the  new instructions.   dexamethasone 4 MG tablet Commonly known as:  DECADRON Take 1 tablet (4 mg total) by mouth daily.   escitalopram 20 MG tablet Commonly known as:  LEXAPRO TAKE 1 TABLET(20 MG) BY MOUTH DAILY What changed:  See the new instructions.   feeding supplement (ENSURE ENLIVE) Liqd Take 237 mLs by mouth 2 (two) times daily between meals. What changed:  when to take this   hydrOXYzine 10 MG tablet Commonly known as:  ATARAX/VISTARIL Take 10 mg by mouth 2 (two) times daily.    levETIRAcetam 1000 MG tablet Commonly known as:  Keppra Take 1 tablet (1,000 mg total) by mouth 2 (two) times daily. What changed:    medication strength  how much to take   pantoprazole 40 MG tablet Commonly known as:  PROTONIX TAKE 1 TABLET(40 MG) BY MOUTH DAILY What changed:  See the new instructions.   Vitamin D (Ergocalciferol) 1.25 MG (50000 UT) Caps capsule Commonly known as:  DRISDOL TAKE 1 CAPSULE BY MOUTH 3 TIMES WEEKLY What changed:  See the new instructions.      Follow-up Information    Vaslow, Acey Lav, MD Follow up.   Specialties:  Psychiatry, Neurology, Oncology Contact information: [place] [id number] [phone]          Allergies  Allergen Reactions  . Thorazine [Chlorpromazine] Anaphylaxis  . Ribociclib Other (See Comments)    Exfoliative dermatitis    Consultations:  Over phone with dr Mickeal Skinner and dr Leonel Ramsay   Procedures/Studies: Ct Angio Head W Or Wo Contrast  Result Date: 01/27/2019 CLINICAL DATA:  Expressive aphasia, vomiting. Known worsening metastatic breast cancer. EXAM: CT ANGIOGRAPHY HEAD AND NECK TECHNIQUE: Multidetector CT imaging of the head and neck was performed using the standard protocol during bolus administration of intravenous contrast. Multiplanar CT image reconstructions and MIPs were obtained to evaluate the vascular anatomy. Carotid stenosis measurements (when applicable) are obtained utilizing NASCET criteria, using the distal internal carotid diameter as the denominator. CONTRAST:  63mL OMNIPAQUE IOHEXOL 350 MG/ML SOLN COMPARISON:  CT HEAD January 26, 2019 and MRI head October 23, 2018 FINDINGS: CTA NECK FINDINGS: AORTIC ARCH: Normal appearance of the thoracic arch, normal branch pattern. Trace calcific atherosclerosis. The origins of the innominate, left Common carotid artery and subclavian artery are patent. RIGHT CAROTID SYSTEM: Common carotid artery is patent. Mild intimal thickening of the  carotid bifurcation without hemodynamically significant stenosis by NASCET criteria. Normal appearance of the internal carotid artery. LEFT CAROTID SYSTEM: Common carotid artery is patent. Normal appearance of the carotid bifurcation without hemodynamically significant stenosis by NASCET criteria. Normal appearance of the internal carotid artery. VERTEBRAL ARTERIES:Left vertebral artery is dominant. Normal appearance of the vertebral arteries, widely patent. SKELETON: No acute osseous process though bone windows have not been submitted. Numerous sclerotic metastasis throughout the included axial skeleton. OTHER NECK: Soft tissues of the neck are nonacute though, not tailored for evaluation. UPPER CHEST: Included lung apices are clear. No superior mediastinal lymphadenopathy. RIGHT chest Port-A-Cath. CTA HEAD FINDINGS: ANTERIOR CIRCULATION: Patent cervical internal carotid arteries, petrous, cavernous and supra clinoid internal carotid arteries. Patent anterior communicating artery. Patent anterior and middle cerebral arteries. No large vessel occlusion, flow-limiting stenosis, contrast extravasation or aneurysm. POSTERIOR CIRCULATION: Patent vertebral arteries, vertebrobasilar junction and basilar artery, as well as main branch vessels. Patent posterior cerebral arteries. Bilateral posterior communicating arteries present. No large vessel occlusion, flow-limiting stenosis, contrast extravasation or aneurysm. VENOUS SINUSES: Major dural venous sinuses are patent though not tailored  for evaluation on this angiographic examination. ANATOMIC VARIANTS: None. DELAYED PHASE: Worsening 1.9 x 3.4 cm LEFT frontotemporal dural metastasis. New 16 mm LEFT parafalcine dural versus parenchymal metastasis. Worsening LEFT frontal convexity dural metastasis measuring to 10 mm in transaxial dimension. New 1 cm RIGHT frontal convexity dural versus intraparenchymal metastasis. Stable 13 mm RIGHT posterior fossa extra-axial probable  meningioma. MIP images reviewed. IMPRESSION: CTA NECK: 1. No acute vascular process. 2. No hemodynamically significant stenosis ICA's. Patent vertebral arteries. 3. Diffuse osseous metastasis. CTA HEAD: 1. No emergent large vessel occlusion or flow-limiting stenosis. 2. Worsening predominantly dural-based metastasis. Electronically Signed   By: Elon Alas M.D.   On: 01/27/2019 01:40   Dg Chest 2 View  Result Date: 01/26/2019 CLINICAL DATA:  Altered mental status EXAM: CHEST - 2 VIEW COMPARISON:  01/21/2017, CT chest 12/01/2018 FINDINGS: Right-sided central venous port tip over the SVC. No acute airspace disease or effusion. Normal heart size. No pneumothorax. Degenerative changes of the spine. Sclerosis within the vertebral bodies. IMPRESSION: No active cardiopulmonary disease. 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   On: 01/26/2019 23:46   Ct Head Wo Contrast  Result Date: 01/27/2019 CLINICAL DATA:  Expressive aphasia, last seen normal at 2100 hours. History of metastatic breast cancer, status post whole brain chemo radiation. EXAM: CT HEAD WITHOUT CONTRAST TECHNIQUE: Contiguous axial images were obtained from the base of the skull through the vertex without intravenous contrast. COMPARISON:  MRI of the head October 23, 2018. FINDINGS: Mild motion degraded examination. BRAIN: LEFT > RIGHT frontal and LEFT temporal vasogenic edema with mass effect. 6 mm LEFT-to-RIGHT midline shift. Mild mass effect on LEFT ventricle without hydrocephalus or ventricular entrapment. No acute large vascular territory infarcts. Stable advanced cerebellar atrophy. Basal cisterns are patent. Soft tissue within LEFT frontoparietal extra-axial space consistent with dural metastasis seen on prior MRI. No abnormal extra-axial fluid collections. VASCULAR: Trace calcific atherosclerosis of the carotid siphons. SKULL: No skull fracture. No  significant scalp soft tissue swelling. SINUSES/ORBITS: Moderate lobulatedthe included ocular globes and orbital contents are non-suspicious. OTHER: Calcified stylohyoid ligaments. IMPRESSION: 1. Mild motion degraded examination. 2. Worsening intracranial metastasis. New 6 mm LEFT-to-RIGHT midline shift without ventricular entrapment. 3. Similar advanced cerebellar atrophy. 4. Acute findings discussed with and reconfirmed by Dr.YELVERTON on 01/26/2019 at 12:14 am. Electronically Signed   By: Elon Alas M.D.   On: 01/27/2019 00:28   Ct Angio Neck W And/or Wo Contrast  Result Date: 01/27/2019 CLINICAL DATA:  Expressive aphasia, vomiting. Known worsening metastatic breast cancer. EXAM: CT ANGIOGRAPHY HEAD AND NECK TECHNIQUE: Multidetector CT imaging of the head and neck was performed using the standard protocol during bolus administration of intravenous contrast. Multiplanar CT image reconstructions and MIPs were obtained to evaluate the vascular anatomy. Carotid stenosis measurements (when applicable) are obtained utilizing NASCET criteria, using the distal internal carotid diameter as the denominator. CONTRAST:  27mL OMNIPAQUE IOHEXOL 350 MG/ML SOLN COMPARISON:  CT HEAD January 26, 2019 and MRI head October 23, 2018 FINDINGS: CTA NECK FINDINGS: AORTIC ARCH: Normal appearance of the thoracic arch, normal branch pattern. Trace calcific atherosclerosis. The origins of the innominate, left Common carotid artery and subclavian artery are patent. RIGHT CAROTID SYSTEM: Common carotid artery is patent. Mild intimal thickening of the carotid bifurcation without hemodynamically significant stenosis by NASCET criteria. Normal appearance of the internal carotid artery. LEFT CAROTID SYSTEM: Common carotid artery is patent. Normal appearance of the carotid bifurcation without  hemodynamically significant stenosis by NASCET criteria. Normal appearance of the internal carotid artery. VERTEBRAL ARTERIES:Left vertebral artery is  dominant. Normal appearance of the vertebral arteries, widely patent. SKELETON: No acute osseous process though bone windows have not been submitted. Numerous sclerotic metastasis throughout the included axial skeleton. OTHER NECK: Soft tissues of the neck are nonacute though, not tailored for evaluation. UPPER CHEST: Included lung apices are clear. No superior mediastinal lymphadenopathy. RIGHT chest Port-A-Cath. CTA HEAD FINDINGS: ANTERIOR CIRCULATION: Patent cervical internal carotid arteries, petrous, cavernous and supra clinoid internal carotid arteries. Patent anterior communicating artery. Patent anterior and middle cerebral arteries. No large vessel occlusion, flow-limiting stenosis, contrast extravasation or aneurysm. POSTERIOR CIRCULATION: Patent vertebral arteries, vertebrobasilar junction and basilar artery, as well as main branch vessels. Patent posterior cerebral arteries. Bilateral posterior communicating arteries present. No large vessel occlusion, flow-limiting stenosis, contrast extravasation or aneurysm. VENOUS SINUSES: Major dural venous sinuses are patent though not tailored for evaluation on this angiographic examination. ANATOMIC VARIANTS: None. DELAYED PHASE: Worsening 1.9 x 3.4 cm LEFT frontotemporal dural metastasis. New 16 mm LEFT parafalcine dural versus parenchymal metastasis. Worsening LEFT frontal convexity dural metastasis measuring to 10 mm in transaxial dimension. New 1 cm RIGHT frontal convexity dural versus intraparenchymal metastasis. Stable 13 mm RIGHT posterior fossa extra-axial probable meningioma. MIP images reviewed. IMPRESSION: CTA NECK: 1. No acute vascular process. 2. No hemodynamically significant stenosis ICA's. Patent vertebral arteries. 3. Diffuse osseous metastasis. CTA HEAD: 1. No emergent large vessel occlusion or flow-limiting stenosis. 2. Worsening predominantly dural-based metastasis. Electronically Signed   By: Elon Alas M.D.   On: 01/27/2019 01:40     (Echo, Carotid, EGD, Colonoscopy, ERCP)    Subjective:   Discharge Exam: Vitals:   01/28/19 0037 01/28/19 0451  BP: 118/71 111/81  Pulse: (!) 52 (!) 53  Resp: 18 18  Temp: 97.9 F (36.6 C) 97.8 F (36.6 C)  SpO2: 98% 98%   Vitals:   01/27/19 1656 01/27/19 2039 01/28/19 0037 01/28/19 0451  BP: 117/81 108/75 118/71 111/81  Pulse: (!) 58 (!) 56 (!) 52 (!) 53  Resp: 14 18 18 18   Temp: 98.5 F (36.9 C) 98.2 F (36.8 C) 97.9 F (36.6 C) 97.8 F (36.6 C)  TempSrc: Oral Oral Oral Oral  SpO2: 96% 98% 98% 98%  Weight:      Height:        General: Pt is alert, awake, not in acute distress Cardiovascular: RRR, S1/S2 +, no rubs, no gallops Respiratory: CTA bilaterally, no wheezing, no rhonchi Abdominal: Soft, NT, ND, bowel sounds + Extremities: no edema, no cyanosis    The results of significant diagnostics from this hospitalization (including imaging, microbiology, ancillary and laboratory) are listed below for reference.     Microbiology: No results found for this or any previous visit (from the past 240 hour(s)).   Labs: BNP (last 3 results) No results for input(s): BNP in the last 8760 hours. Basic Metabolic Panel: Recent Labs  Lab 01/22/19 0949 01/26/19 2254 01/26/19 2325  NA 144 135  --   K 3.5 5.0  --   CL 110 99  --   CO2 22 26  --   GLUCOSE 125* 196*  --   BUN 5* 9  --   CREATININE 0.74 0.72 0.60  CALCIUM 7.9* 9.2  --    Liver Function Tests: Recent Labs  Lab 01/22/19 0949 01/26/19 2254  AST 28 56*  ALT 18 26  ALKPHOS 199* 166*  BILITOT 0.5 1.3*  PROT 6.8  6.1*  ALBUMIN 3.4* 3.2*   No results for input(s): LIPASE, AMYLASE in the last 168 hours. No results for input(s): AMMONIA in the last 168 hours. CBC: Recent Labs  Lab 01/22/19 0949 01/26/19 2254  WBC 6.5 7.1  NEUTROABS 3.8 5.7  HGB 13.9 13.3  HCT 44.9 40.6  MCV 94.3 91.2  PLT 189 167   Cardiac Enzymes: No results for input(s): CKTOTAL, CKMB, CKMBINDEX, TROPONINI in the  last 168 hours. BNP: Invalid input(s): POCBNP CBG: No results for input(s): GLUCAP in the last 168 hours. D-Dimer No results for input(s): DDIMER in the last 72 hours. Hgb A1c No results for input(s): HGBA1C in the last 72 hours. Lipid Profile No results for input(s): CHOL, HDL, LDLCALC, TRIG, CHOLHDL, LDLDIRECT in the last 72 hours. Thyroid function studies No results for input(s): TSH, T4TOTAL, T3FREE, THYROIDAB in the last 72 hours.  Invalid input(s): FREET3 Anemia work up No results for input(s): VITAMINB12, FOLATE, FERRITIN, TIBC, IRON, RETICCTPCT in the last 72 hours. Urinalysis    Component Value Date/Time   COLORURINE STRAW (A) 01/26/2019 2307   APPEARANCEUR CLEAR 01/26/2019 2307   LABSPEC 1.004 (L) 01/26/2019 2307   PHURINE 7.0 01/26/2019 2307   GLUCOSEU NEGATIVE 01/26/2019 2307   HGBUR NEGATIVE 01/26/2019 2307   [place] 01/26/2019 2307   KETONESUR NEGATIVE 01/26/2019 2307   PROTEINUR NEGATIVE 01/26/2019 2307   NITRITE NEGATIVE 01/26/2019 2307   LEUKOCYTESUR NEGATIVE 01/26/2019 2307   Sepsis Labs Invalid input(s): PROCALCITONIN,  WBC,  LACTICIDVEN Microbiology No results found for this or any previous visit (from the past 240 hour(s)).   Time coordinating discharge: 33 minutes  SIGNED:   Georgette Shell, MD  [facility] 01/28/2019, 8:35 AM Pager   If 7PM-7AM, please contact night-coverage [email or web address] Password [email or web address]

## 2019-01-28 NOTE — Progress Notes (Signed)
Pt awaiting sister to give ride home.  Reviewed discharge summary and follow up instructions.  Pt discharged from unit per staff. Tele removed, cath capped and flushed for home. No new questions or concerns.

## 2019-01-29 ENCOUNTER — Other Ambulatory Visit: Payer: Self-pay | Admitting: Radiation Therapy

## 2019-01-29 ENCOUNTER — Telehealth: Payer: Self-pay | Admitting: *Deleted

## 2019-01-29 NOTE — Telephone Encounter (Signed)
Patient was in hospital over weekend. Potassium was d/c'd on discharge. She wanted Dr. Irene Limbo to know and asked if that was ok. Per Dr. Irene Limbo, since patient is not on diuretics, it is ok. Contacted patient, gave her this information. She verbalized understanding.

## 2019-02-02 ENCOUNTER — Ambulatory Visit
Admission: RE | Admit: 2019-02-02 | Discharge: 2019-02-02 | Disposition: A | Discharge status: Home / Self Care | Payer: BC Managed Care – PPO | Source: Ambulatory Visit | Attending: Internal Medicine | Admitting: Internal Medicine

## 2019-02-02 ENCOUNTER — Inpatient Hospital Stay (HOSPITAL_BASED_OUTPATIENT_CLINIC_OR_DEPARTMENT_OTHER): Payer: BC Managed Care – PPO | Admitting: Internal Medicine

## 2019-02-02 ENCOUNTER — Other Ambulatory Visit: Payer: Self-pay

## 2019-02-02 VITALS — BP 121/88 | HR 80 | Temp 98.1°F | Resp 18 | Ht 65.0 in | Wt 157.0 lb

## 2019-02-02 DIAGNOSIS — C7931 Secondary malignant neoplasm of brain: Secondary | ICD-10-CM | POA: Diagnosis not present

## 2019-02-02 DIAGNOSIS — R569 Unspecified convulsions: Secondary | ICD-10-CM | POA: Diagnosis not present

## 2019-02-02 DIAGNOSIS — C50812 Malignant neoplasm of overlapping sites of left female breast: Secondary | ICD-10-CM | POA: Diagnosis not present

## 2019-02-02 MED ORDER — GADOBENATE DIMEGLUMINE 529 MG/ML IV SOLN
15.0000 mL | Freq: Once | INTRAVENOUS | Status: AC | PRN
  Administered 2019-02-02: 15 mL via INTRAVENOUS

## 2019-02-02 NOTE — Progress Notes (Signed)
[facility] [place], [place] [address] [id number]   Interval Evaluation  Date of Service: 02/02/19 Patient Name: Stacey Cox Patient MRN: [id number] Patient DOB: [birth year] Provider: Ventura Sellers, MD  Identifying Statement:  Stacey Cox is a 63 y.o. female with Brain metastases ([place])  Seizures ([place])   Oncologic History: Oncology History   Declined genetic testing     Metastatic breast cancer ([place])   01/21/2017 Initial Diagnosis    Metastatic breast cancer ([place])    07/13/2018 -  Chemotherapy    The patient had DOXOrubicin (ADRIAMYCIN) chemo injection 36 mg, 20 mg/m2 = 36 mg (66.7 % of original dose 30 mg/m2), Intravenous,  Once, 6 of 6 cycles Dose modification: 30 mg/m2 (original dose 30 mg/m2, Cycle 1, Reason: Change in LFTs), 20 mg/m2 (original dose 30 mg/m2, Cycle 1, Reason: Change in LFTs), 30 mg/m2 (original dose 30 mg/m2, Cycle 3, Reason: Change in LFTs), 50 mg/m2 (original dose 30 mg/m2, Cycle 4, Reason: Provider Judgment) Administration: 36 mg (07/19/2018), 36 mg (08/09/2018), 54 mg (08/30/2018), 90 mg (09/20/2018), 90 mg (10/10/2018), 90 mg (10/31/2018) palonosetron (ALOXI) injection 0.25 mg, 0.25 mg, Intravenous,  Once, 6 of 6 cycles Administration: 0.25 mg (07/19/2018), 0.25 mg (08/09/2018), 0.25 mg (08/30/2018), 0.25 mg (09/20/2018), 0.25 mg (10/10/2018), 0.25 mg (10/31/2018) pegfilgrastim-cbqv (UDENYCA) injection 6 mg, 6 mg, Subcutaneous, Once, 6 of 6 cycles Administration: 6 mg (08/11/2018), 6 mg (09/01/2018), 6 mg (09/22/2018), 6 mg (10/12/2018), 6 mg (11/01/2018) cyclophosphamide (CYTOXAN) 720 mg in sodium chloride 0.9 % 250 mL chemo infusion, 400 mg/m2 = 720 mg (100 % of original dose 400 mg/m2), Intravenous,  Once, 6 of 6 cycles Dose modification: 400 mg/m2 (original dose 400 mg/m2, Cycle 1, Reason: Change in LFTs), 500 mg/m2 (original dose 400 mg/m2, Cycle 3, Reason: Change in LFTs) Administration: 720 mg  (07/19/2018), 720 mg (08/09/2018), 900 mg (08/30/2018), 900 mg (09/20/2018), 900 mg (10/10/2018), 900 mg (10/31/2018) fosaprepitant (EMEND) 150 mg, dexamethasone (DECADRON) 12 mg in sodium chloride 0.9 % 145 mL IVPB, , Intravenous,  Once, 6 of 6 cycles Administration:  (07/19/2018),  (08/09/2018),  (08/30/2018),  (09/20/2018),  (10/10/2018),  (10/31/2018)  for chemotherapy treatment.      Liver metastases ([place])   07/12/2018 Initial Diagnosis    Liver metastases ([place])    07/13/2018 -  Chemotherapy    The patient had DOXOrubicin (ADRIAMYCIN) chemo injection 36 mg, 20 mg/m2 = 36 mg (66.7 % of original dose 30 mg/m2), Intravenous,  Once, 6 of 6 cycles Dose modification: 30 mg/m2 (original dose 30 mg/m2, Cycle 1, Reason: Change in LFTs), 20 mg/m2 (original dose 30 mg/m2, Cycle 1, Reason: Change in LFTs), 30 mg/m2 (original dose 30 mg/m2, Cycle 3, Reason: Change in LFTs), 50 mg/m2 (original dose 30 mg/m2, Cycle 4, Reason: Provider Judgment) Administration: 36 mg (07/19/2018), 36 mg (08/09/2018), 54 mg (08/30/2018), 90 mg (09/20/2018), 90 mg (10/10/2018), 90 mg (10/31/2018) palonosetron (ALOXI) injection 0.25 mg, 0.25 mg, Intravenous,  Once, 6 of 6 cycles Administration: 0.25 mg (07/19/2018), 0.25 mg (08/09/2018), 0.25 mg (08/30/2018), 0.25 mg (09/20/2018), 0.25 mg (10/10/2018), 0.25 mg (10/31/2018) pegfilgrastim-cbqv (UDENYCA) injection 6 mg, 6 mg, Subcutaneous, Once, 6 of 6 cycles Administration: 6 mg (08/11/2018), 6 mg (09/01/2018), 6 mg (09/22/2018), 6 mg (10/12/2018), 6 mg (11/01/2018) cyclophosphamide (CYTOXAN) 720 mg in sodium chloride 0.9 % 250 mL chemo infusion, 400 mg/m2 = 720 mg (100 % of original dose 400 mg/m2), Intravenous,  Once, 6 of 6 cycles Dose modification: 400  mg/m2 (original dose 400 mg/m2, Cycle 1, Reason: Change in LFTs), 500 mg/m2 (original dose 400 mg/m2, Cycle 3, Reason: Change in LFTs) Administration: 720 mg (07/19/2018), 720 mg (08/09/2018), 900 mg (08/30/2018), 900 mg (09/20/2018), 900 mg  (10/10/2018), 900 mg (10/31/2018) fosaprepitant (EMEND) 150 mg, dexamethasone (DECADRON) 12 mg in sodium chloride 0.9 % 145 mL IVPB, , Intravenous,  Once, 6 of 6 cycles Administration:  (07/19/2018),  (08/09/2018),  (08/30/2018),  (09/20/2018),  (10/10/2018),  (10/31/2018)  for chemotherapy treatment.      Interval History: Stacey Cox presents today for follow up after recent MRI brain.  She describes one recent seizure, described as "10 minutes of garbled speech".  It prompted an ED visit and admission after CT head demonstrated concerning changes in brain.  She also has had several episodes of vomiting, mostly at night.  She continues to take decadron 4mg  daily since discharge.  Otherwise, she feels like her "normal self" and remains functional and independent.    (H+P) 08/01/18:  Stacey Cox presented last week with two witnessed episodes of generalized shaking, eyes open, consistent with generalized seizure.  She had just two days prior been dosed with first cycle of doxorubicin and cyclophosphamide for progressive hormone positive breast cancer.  She denied any neurologic symptoms prior to the seizures, and after a day inpatient was back to her baseline.  MRI did demonstrate a metastatic focus with an enhancing dural tail.  She was started on decadron 4mg  daily and had repeat MRI few days ago.  At this time she has no neurologic complaints.    Medications: Current Outpatient Medications on File Prior to Visit  Medication Sig Dispense Refill   aspirin EC 81 MG tablet Take 1 tablet (81 mg total) by mouth daily.     chlorhexidine (PERIDEX) 0.12 % solution RINSE AND GARGLE 15 ML BY MOUTH OR THROAT TWICE DAILY AS DIRECTED AS NEEDED (Patient taking differently: Use as directed 15 mLs in the mouth or throat 2 (two) times daily as needed (mouth rinse after vomiting). Swish and spit) 473 mL 1   dexamethasone (DECADRON) 4 MG tablet Take 1 tablet (4 mg total) by mouth daily. 30 tablet 0   escitalopram  (LEXAPRO) 20 MG tablet TAKE 1 TABLET(20 MG) BY MOUTH DAILY 30 tablet 0   feeding supplement, ENSURE ENLIVE, (ENSURE ENLIVE) LIQD Take 237 mLs by mouth 2 (two) times daily between meals. (Patient taking differently: Take 237 mLs by mouth daily. ) 237 mL 12   hydrOXYzine (ATARAX/VISTARIL) 10 MG tablet Take 10 mg by mouth 2 (two) times daily.     levETIRAcetam (KEPPRA) 1000 MG tablet Take 1 tablet (1,000 mg total) by mouth 2 (two) times daily. 60 tablet 0   pantoprazole (PROTONIX) 40 MG tablet TAKE 1 TABLET(40 MG) BY MOUTH DAILY (Patient taking differently: Take 40 mg by mouth daily. ) 30 tablet 1   Vitamin D, Ergocalciferol, (DRISDOL) 1.25 MG (50000 UT) CAPS capsule TAKE 1 CAPSULE BY MOUTH 3 TIMES WEEKLY (Patient taking differently: Take 50,000 Units by mouth See admin instructions. Take one capsule (50,000 units) by mouth 3 times week - every Monday, Tuesday, Wednesday) 24 capsule 0   No current facility-administered medications on file prior to visit.     Allergies:  Allergies  Allergen Reactions   Thorazine [Chlorpromazine] Anaphylaxis   Ribociclib Other (See Comments)    Exfoliative dermatitis   Past Medical History:  Past Medical History:  Diagnosis Date   Cancer [facility])    Metastatic Breast Cancer  Past Surgical History:  Past Surgical History:  Procedure Laterality Date   CHOLECYSTECTOMY     IR IMAGING GUIDED PORT INSERTION  07/14/2018   TONSILLECTOMY     Social History:  Social History   Socioeconomic History   Marital status: Single    Spouse name: Not on file   Number of children: Not on file   Years of education: Not on file   Highest education level: Not on file  Occupational History   Not on file  Social Needs   Financial resource strain: Not on file   Food insecurity:    Worry: Not on file    Inability: Not on file   Transportation needs:    Medical: Not on file    Non-medical: Not on file  Tobacco Use   Smoking status: Current Every Day  Smoker    Packs/day: 0.15    Types: Cigarettes    Last attempt to quit: 01/21/2017    Years since quitting: 2.0   Smokeless tobacco: Never Used  Substance and Sexual Activity   Alcohol use: No    Alcohol/week: 0.0 standard drinks   Drug use: No   Sexual activity: Never  Lifestyle   Physical activity:    Days per week: Not on file    Minutes per session: Not on file   Stress: Not on file  Relationships   Social connections:    Talks on phone: Not on file    Gets together: Not on file    Attends religious service: Not on file    Active member of club or organization: Not on file    Attends meetings of clubs or organizations: Not on file    Relationship status: Not on file   Intimate partner violence:    Fear of current or ex partner: No    Emotionally abused: No    Physically abused: No    Forced sexual activity: No  Other Topics Concern   Not on file  Social History Narrative   Lives alone. 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 Has phobia of the medical system.   Family History:  Family History  Problem Relation Age of Onset   Cancer Mother        breast   Cancer Sister        breast    Review of Systems: Constitutional: Denies fevers, chills or abnormal weight loss Eyes: Denies blurriness of vision Ears, nose, mouth, throat, and face: Denies mucositis or sore throat Respiratory: Denies cough, dyspnea or wheezes Cardiovascular: Denies palpitation, chest discomfort or lower extremity swelling Gastrointestinal:  +vomiting GU: Denies dysuria or incontinence Skin: Denies abnormal skin rashes Neurological: Per HPI Musculoskeletal: Denies joint pain, back or neck discomfort. No decrease in ROM Behavioral/Psych: Denies anxiety, disturbance in thought content, and mood instability   Physical Exam: Vitals:   02/02/19 1345  BP: 121/88  Pulse: 80  Resp: 18  Temp: 98.1 F (36.7 C)  SpO2: 98%   KPS: 90. General: Alert, cooperative, pleasant, in no acute  distress Head: Craniotomy scar noted, dry and intact. EENT: No conjunctival injection or scleral icterus. Oral mucosa moist Lungs: Resp effort normal Cardiac: Regular rate and rhythm Abdomen: Soft, non-distended abdomen Skin: Desquamative rash on posterior lower legs Extremities: No clubbing or edema  Neurologic Exam: Mental Status: Awake, alert, attentive to examiner. Oriented to self and environment. Language is fluent with intact comprehension.  Cranial Nerves: Visual acuity is grossly normal. Visual fields are full. Extra-ocular movements intact. No ptosis. Face is  symmetric, tongue midline. Motor: Tone and bulk are normal. Power is full in both arms and legs. Reflexes are symmetric, no pathologic reflexes present. Intact finger to nose bilaterally Sensory: Intact to light touch and temperature Gait: Normal and tandem gait is normal.   Labs: I have reviewed the data as listed    Component Value Date/Time   NA 135 01/26/2019 2254   NA 139 11/24/2017 1340   K 5.0 01/26/2019 2254   K 3.7 11/24/2017 1340   CL 99 01/26/2019 2254   CO2 26 01/26/2019 2254   CO2 25 11/24/2017 1340   GLUCOSE 196 (H) 01/26/2019 2254   GLUCOSE 83 11/24/2017 1340   BUN 9 01/26/2019 2254   BUN 9.7 11/24/2017 1340   CREATININE 0.60 01/26/2019 2325   CREATININE 0.74 01/22/2019 0949   CREATININE 1.2 (H) 11/24/2017 1340   CALCIUM 9.2 01/26/2019 2254   CALCIUM 9.3 11/24/2017 1340   PROT 6.1 (L) 01/26/2019 2254   PROT 7.1 11/24/2017 1340   ALBUMIN 3.2 (L) 01/26/2019 2254   ALBUMIN 3.8 11/24/2017 1340   AST 56 (H) 01/26/2019 2254   AST 28 01/22/2019 0949   AST 29 11/24/2017 1340   ALT 26 01/26/2019 2254   ALT 18 01/22/2019 0949   ALT 35 11/24/2017 1340   ALKPHOS 166 (H) 01/26/2019 2254   ALKPHOS 73 11/24/2017 1340   BILITOT 1.3 (H) 01/26/2019 2254   BILITOT 0.5 01/22/2019 0949   BILITOT 0.40 11/24/2017 1340   GFRNONAA >60 01/26/2019 2254   GFRNONAA >60 01/22/2019 0949   GFRAA >60 01/26/2019 2254    GFRAA >60 01/22/2019 0949   Lab Results  Component Value Date   WBC 7.1 01/26/2019   NEUTROABS 5.7 01/26/2019   HGB 13.3 01/26/2019   HCT 40.6 01/26/2019   MCV 91.2 01/26/2019   PLT 167 01/26/2019    Imaging:  [place] Clinician Interpretation: I have personally reviewed the CNS images as listed.  My interpretation, in the context of the patient's clinical presentation, is progressive disease  Ct Angio Head W Or Wo Contrast  Result Date: 01/27/2019 CLINICAL DATA:  Expressive aphasia, vomiting. Known worsening metastatic breast cancer. EXAM: CT ANGIOGRAPHY HEAD AND NECK TECHNIQUE: Multidetector CT imaging of the head and neck was performed using the standard protocol during bolus administration of intravenous contrast. Multiplanar CT image reconstructions and MIPs were obtained to evaluate the vascular anatomy. Carotid stenosis measurements (when applicable) are obtained utilizing NASCET criteria, using the distal internal carotid diameter as the denominator. CONTRAST:  32mL OMNIPAQUE IOHEXOL 350 MG/ML SOLN COMPARISON:  CT HEAD January 26, 2019 and MRI head October 23, 2018 FINDINGS: CTA NECK FINDINGS: AORTIC ARCH: Normal appearance of the thoracic arch, normal branch pattern. Trace calcific atherosclerosis. The origins of the innominate, left Common carotid artery and subclavian artery are patent. RIGHT CAROTID SYSTEM: Common carotid artery is patent. Mild intimal thickening of the carotid bifurcation without hemodynamically significant stenosis by NASCET criteria. Normal appearance of the internal carotid artery. LEFT CAROTID SYSTEM: Common carotid artery is patent. Normal appearance of the carotid bifurcation without hemodynamically significant stenosis by NASCET criteria. Normal appearance of the internal carotid artery. VERTEBRAL ARTERIES:Left vertebral artery is dominant. Normal appearance of the vertebral arteries, widely patent. SKELETON: No acute osseous process though bone windows have not been  submitted. Numerous sclerotic metastasis throughout the included axial skeleton. OTHER NECK: Soft tissues of the neck are nonacute though, not tailored for evaluation. UPPER CHEST: Included lung apices are clear. No superior mediastinal lymphadenopathy. RIGHT chest  Port-A-Cath. CTA HEAD FINDINGS: ANTERIOR CIRCULATION: Patent cervical internal carotid arteries, petrous, cavernous and supra clinoid internal carotid arteries. Patent anterior communicating artery. Patent anterior and middle cerebral arteries. No large vessel occlusion, flow-limiting stenosis, contrast extravasation or aneurysm. POSTERIOR CIRCULATION: Patent vertebral arteries, vertebrobasilar junction and basilar artery, as well as main branch vessels. Patent posterior cerebral arteries. Bilateral posterior communicating arteries present. No large vessel occlusion, flow-limiting stenosis, contrast extravasation or aneurysm. VENOUS SINUSES: Major dural venous sinuses are patent though not tailored for evaluation on this angiographic examination. ANATOMIC VARIANTS: None. DELAYED PHASE: Worsening 1.9 x 3.4 cm LEFT frontotemporal dural metastasis. New 16 mm LEFT parafalcine dural versus parenchymal metastasis. Worsening LEFT frontal convexity dural metastasis measuring to 10 mm in transaxial dimension. New 1 cm RIGHT frontal convexity dural versus intraparenchymal metastasis. Stable 13 mm RIGHT posterior fossa extra-axial probable meningioma. MIP images reviewed. IMPRESSION: CTA NECK: 1. No acute vascular process. 2. No hemodynamically significant stenosis ICA's. Patent vertebral arteries. 3. Diffuse osseous metastasis. CTA HEAD: 1. No emergent large vessel occlusion or flow-limiting stenosis. 2. Worsening predominantly dural-based metastasis. Electronically Signed   By: Elon Alas M.D.   On: 01/27/2019 01:40   Dg Chest 2 View  Result Date: 01/26/2019 CLINICAL DATA:  Altered mental status EXAM: CHEST - 2 VIEW COMPARISON:  01/21/2017, CT chest  12/01/2018 FINDINGS: Right-sided central venous port tip over the SVC. No acute airspace disease or effusion. Normal heart size. No pneumothorax. Degenerative changes of the spine. Sclerosis within the vertebral bodies. IMPRESSION: No active cardiopulmonary disease. 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   On: 01/26/2019 23:46   Ct Head Wo Contrast  Result Date: 01/27/2019 CLINICAL DATA:  Expressive aphasia, last seen normal at 2100 hours. History of metastatic breast cancer, status post whole brain chemo radiation. EXAM: CT HEAD WITHOUT CONTRAST TECHNIQUE: Contiguous axial images were obtained from the base of the skull through the vertex without intravenous contrast. COMPARISON:  MRI of the head October 23, 2018. FINDINGS: Mild motion degraded examination. BRAIN: LEFT > RIGHT frontal and LEFT temporal vasogenic edema with mass effect. 6 mm LEFT-to-RIGHT midline shift. Mild mass effect on LEFT ventricle without hydrocephalus or ventricular entrapment. No acute large vascular territory infarcts. Stable advanced cerebellar atrophy. Basal cisterns are patent. Soft tissue within LEFT frontoparietal extra-axial space consistent with dural metastasis seen on prior MRI. No abnormal extra-axial fluid collections. VASCULAR: Trace calcific atherosclerosis of the carotid siphons. SKULL: No skull fracture. No significant scalp soft tissue swelling. SINUSES/ORBITS: Moderate lobulatedthe included ocular globes and orbital contents are non-suspicious. OTHER: Calcified stylohyoid ligaments. IMPRESSION: 1. Mild motion degraded examination. 2. Worsening intracranial metastasis. New 6 mm LEFT-to-RIGHT midline shift without ventricular entrapment. 3. Similar advanced cerebellar atrophy. 4. Acute findings discussed with and reconfirmed by Dr.YELVERTON on 01/26/2019 at 12:14 am. Electronically Signed   By: Elon Alas M.D.   On: 01/27/2019  00:28   Ct Angio Neck W And/or Wo Contrast  Result Date: 01/27/2019 CLINICAL DATA:  Expressive aphasia, vomiting. Known worsening metastatic breast cancer. EXAM: CT ANGIOGRAPHY HEAD AND NECK TECHNIQUE: Multidetector CT imaging of the head and neck was performed using the standard protocol during bolus administration of intravenous contrast. Multiplanar CT image reconstructions and MIPs were obtained to evaluate the vascular anatomy. Carotid stenosis measurements (when applicable) are obtained utilizing NASCET criteria, using the distal internal carotid diameter as the denominator. CONTRAST:  46mL OMNIPAQUE IOHEXOL 350 MG/ML SOLN COMPARISON:  CT HEAD  January 26, 2019 and MRI head October 23, 2018 FINDINGS: CTA NECK FINDINGS: AORTIC ARCH: Normal appearance of the thoracic arch, normal branch pattern. Trace calcific atherosclerosis. The origins of the innominate, left Common carotid artery and subclavian artery are patent. RIGHT CAROTID SYSTEM: Common carotid artery is patent. Mild intimal thickening of the carotid bifurcation without hemodynamically significant stenosis by NASCET criteria. Normal appearance of the internal carotid artery. LEFT CAROTID SYSTEM: Common carotid artery is patent. Normal appearance of the carotid bifurcation without hemodynamically significant stenosis by NASCET criteria. Normal appearance of the internal carotid artery. VERTEBRAL ARTERIES:Left vertebral artery is dominant. Normal appearance of the vertebral arteries, widely patent. SKELETON: No acute osseous process though bone windows have not been submitted. Numerous sclerotic metastasis throughout the included axial skeleton. OTHER NECK: Soft tissues of the neck are nonacute though, not tailored for evaluation. UPPER CHEST: Included lung apices are clear. No superior mediastinal lymphadenopathy. RIGHT chest Port-A-Cath. CTA HEAD FINDINGS: ANTERIOR CIRCULATION: Patent cervical internal carotid arteries, petrous, cavernous and supra  clinoid internal carotid arteries. Patent anterior communicating artery. Patent anterior and middle cerebral arteries. No large vessel occlusion, flow-limiting stenosis, contrast extravasation or aneurysm. POSTERIOR CIRCULATION: Patent vertebral arteries, vertebrobasilar junction and basilar artery, as well as main branch vessels. Patent posterior cerebral arteries. Bilateral posterior communicating arteries present. No large vessel occlusion, flow-limiting stenosis, contrast extravasation or aneurysm. VENOUS SINUSES: Major dural venous sinuses are patent though not tailored for evaluation on this angiographic examination. ANATOMIC VARIANTS: None. DELAYED PHASE: Worsening 1.9 x 3.4 cm LEFT frontotemporal dural metastasis. New 16 mm LEFT parafalcine dural versus parenchymal metastasis. Worsening LEFT frontal convexity dural metastasis measuring to 10 mm in transaxial dimension. New 1 cm RIGHT frontal convexity dural versus intraparenchymal metastasis. Stable 13 mm RIGHT posterior fossa extra-axial probable meningioma. MIP images reviewed. IMPRESSION: CTA NECK: 1. No acute vascular process. 2. No hemodynamically significant stenosis ICA's. Patent vertebral arteries. 3. Diffuse osseous metastasis. CTA HEAD: 1. No emergent large vessel occlusion or flow-limiting stenosis. 2. Worsening predominantly dural-based metastasis. Electronically Signed   By: Elon Alas M.D.   On: 01/27/2019 01:40     Assessment/Plan 1. Brain metastases ([place])  2. Seizures ([place])  Ms. Plunk is clinically stable today, now back to baseline after recent breakthrough seizures.  Unfortunately she demonstrates concerning changes on MRI which reflect progressive neoplasm vs an unsusual inflammatory response to radiation.  We are concerned about nodular appearance of enhancement which appears in the right frontal lobe, outside apparent radiation field.  We will present her case on Monday in Brain and Spine Tumor Board for further  characterization and evaluation.  We discussed possible interventions including brain biopsy, whole brain radiation, high dose steroids.   We recommended she continue decadron at 4mg  daily until further clinical treatment pathways are put in place.  Keppra should remain at 1000mg  BID as well.     We appreciate the opportunity to participate in the care of Stacey Cox.  We will give her a call on Monday to update her on recommendations from tumor board.  All questions were answered. The patient knows to call the clinic with any problems, questions or concerns. No barriers to learning were detected.  The total time spent in the encounter was 25 minutes and more than 50% was on counseling and review of test results   Ventura Sellers, MD Medical Director of Neuro-Oncology [facility] [place] 02/02/19 1:47 PM

## 2019-02-04 ENCOUNTER — Other Ambulatory Visit: Payer: Self-pay | Admitting: Hematology

## 2019-02-05 ENCOUNTER — Telehealth: Payer: Self-pay | Admitting: Internal Medicine

## 2019-02-05 ENCOUNTER — Other Ambulatory Visit: Payer: Self-pay

## 2019-02-05 ENCOUNTER — Other Ambulatory Visit: Payer: Self-pay | Admitting: Hematology

## 2019-02-05 NOTE — Telephone Encounter (Signed)
Spoke to Ms. Fransisco Beau regarding discussion at [facility] tumor board this morning.  Communicated recommendation for some form of radiotherapy to progressive lesions.  Unclear at this time whether this will be fractionated radiosurgery, conventional radiation or whole brain radiation.  Also recommended decreasing decadron to 2mg  daily.  She was agreeable with this plan and is looking forward to hearing from the rad-onc department.  Ventura Sellers, MD

## 2019-02-05 NOTE — Progress Notes (Signed)
Brain and Spine Tumor Board Documentation  Stacey Cox was presented by Cecil Cobbs, MD at Brain and Spine Tumor Board on 02/05/2019, which included representatives from neuro oncology, radiation oncology, surgical oncology, navigation, pathology, radiology.  Stacey Cox was presented as a current patient with history of the following treatments: adjuvant radiation.  Additionally, we reviewed previous medical and familial history, history of present illness, and recent lab results along with all available histopathologic and imaging studies. The tumor board considered available treatment options and made the following recommendations:  Radiation therapy (primary modality) Progressive disease outside prior treatment field.  Will require radiotherapy with TBD delivery and treatment plan at this time  Tumor board is a meeting of clinicians from various specialty areas who evaluate and discuss patients for whom a multidisciplinary approach is being considered. Final determinations in the plan of care are those of the provider(s). The responsibility for follow up of recommendations given during tumor board is that of the provider.   Today's extended care, comprehensive team conference, Stacey Cox was not present for the discussion and was not examined.

## 2019-02-06 ENCOUNTER — Encounter: Payer: Self-pay | Admitting: Hematology

## 2019-02-06 ENCOUNTER — Ambulatory Visit: Payer: Self-pay | Admitting: Internal Medicine

## 2019-02-06 NOTE — Progress Notes (Signed)
Enrolled pt in the [employer] program.  She was approved for $6,000.

## 2019-02-09 ENCOUNTER — Ambulatory Visit: Payer: Self-pay

## 2019-02-13 ENCOUNTER — Ambulatory Visit
Admission: RE | Admit: 2019-02-13 | Discharge: 2019-02-13 | Disposition: A | Discharge status: Home / Self Care | Payer: BC Managed Care – PPO | Source: Ambulatory Visit | Attending: Radiation Oncology | Admitting: Radiation Oncology

## 2019-02-13 ENCOUNTER — Other Ambulatory Visit: Payer: Self-pay

## 2019-02-13 ENCOUNTER — Encounter: Payer: Self-pay | Admitting: Urology

## 2019-02-13 DIAGNOSIS — Z7982 Long term (current) use of aspirin: Secondary | ICD-10-CM | POA: Insufficient documentation

## 2019-02-13 DIAGNOSIS — C7931 Secondary malignant neoplasm of brain: Secondary | ICD-10-CM

## 2019-02-13 DIAGNOSIS — C50912 Malignant neoplasm of unspecified site of left female breast: Secondary | ICD-10-CM | POA: Insufficient documentation

## 2019-02-13 DIAGNOSIS — Z510 Encounter for antineoplastic radiation therapy: Secondary | ICD-10-CM | POA: Insufficient documentation

## 2019-02-13 DIAGNOSIS — Z66 Do not resuscitate: Secondary | ICD-10-CM

## 2019-02-13 DIAGNOSIS — F17210 Nicotine dependence, cigarettes, uncomplicated: Secondary | ICD-10-CM | POA: Insufficient documentation

## 2019-02-13 DIAGNOSIS — Z888 Allergy status to other drugs, medicaments and biological substances status: Secondary | ICD-10-CM | POA: Insufficient documentation

## 2019-02-13 DIAGNOSIS — Z91040 Latex allergy status: Secondary | ICD-10-CM | POA: Insufficient documentation

## 2019-02-13 DIAGNOSIS — C50919 Malignant neoplasm of unspecified site of unspecified female breast: Secondary | ICD-10-CM

## 2019-02-13 DIAGNOSIS — Z79899 Other long term (current) drug therapy: Secondary | ICD-10-CM | POA: Insufficient documentation

## 2019-02-13 DIAGNOSIS — Z803 Family history of malignant neoplasm of breast: Secondary | ICD-10-CM | POA: Insufficient documentation

## 2019-02-13 DIAGNOSIS — Z515 Encounter for palliative care: Secondary | ICD-10-CM

## 2019-02-13 NOTE — Consult Note (Signed)
Consultation Note Date: 02/13/2019   Patient Name: Stacey Cox  DOB: August 17, 1956  MRN: [id number]  Age / Sex: 63 y.o., female  PCP: Patient, No Pcp Per Referring Physician: Tyler Pita, MD  Reason for Consultation: Establishing goals of care and Psychosocial/spiritual support  HPI/Patient Profile: 63 y.o. female  with past medical history significant for metastatic hormone positive HER-2 negative breast cancer.  In April 2019 CT scan significant for new enhancing liver lesion in the right hepatic lobe suspicious for metastatic metastasis, again in August 2019 CT significant for new pattern of enhancement and ill-defined lesions in the central left and right hepatic lobe.  S/p 6 cycles of AC completed on 11/01/18.  He has known bone metastasis and unfortunately a metastasis status post SRS radiation therapy  Recent  seizure and MRI Brain on 02/02/2019 significant for disease progression.  Patient saw Dr. Mickeal Skinner on 17 March. She was started on decadron 68m daily and had repeat MRI few days ago.  She is scheduled for radiation therapy under Dr. MTammi Klippel  Patient faces treatment option decisions, advanced directive decisions and anticipatory care needs   Clinical Assessment and Goals of Care:  This NP MWadie Lessenreviewed medical records, received report from team, and then spoke to Ms. Eblen by telephone to introduce myself and the role of palliative medicine into a holistic treatment plan, offer emotional support and explore the patient's goals of care.  Unfortunately we cannot meet face-to-face secondary to restrictions related to COVID-19.  Concept of Palliative Care was discussed.   A  discussion was had today regarding advanced directives.  Concepts specific to code status, artifical feeding and hydration, continued IV antibiotics and rehospitalization was had.  The difference between a aggressive  medical intervention path  and a palliative comfort care path for this patient at this time was had.  Values and goals of care important to patient and family were attempted to be elicited. .[name]  HCPOA/sister/ [facility]  Patient tells me she has her healthcare power of attorney and living well documented.  I encouraged her to bring a copy to her next visit for scanning into her EMR.    SUMMARY OF RECOMMENDATIONS    Code Status/Advance Care Planning:  DNR   Additional Recommendations (Limitations, Scope, Preferences):  Full Scope Treatment   Patient is open to all offered and available medical interventions to prolong life.  Encouraged patient to call me with any questions or concerns and that I would be happy to meet with her again offering holistic support.  Ms. BYoungersis a pleasure to work with.    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  Since she has been sick and unable to be at the [place] "really [place] her students"  Her 2 sisters [place] in [place] encouraged her to move there to be closer to them however that is not in her immediate plan [place].  He  tells me she has lots of friends and her students help her out quite a bit.  Created space and opportunity for patient to explore her thoughts and feelings regarding her current medical situation.   Emotional support offered  Prognosis:   Unable to determine-long-term prognosis is limited     Primary Diagnoses: Present on Admission: **None**   I have reviewed the medical record, interviewed the patient and family, and examined the patient. The following aspects are pertinent.  Past Medical History:  Diagnosis Date   Cancer [facility])    Metastatic Breast Cancer   Social History   Socioeconomic History   Marital status: Single    Spouse name: Not on file   Number of children: Not on file   Years of education:  Not on file   Highest education level: Not on file  Occupational History   Not on file  Social Needs   Financial resource strain: Not on file   Food insecurity:    Worry: Not on file    Inability: Not on file   Transportation needs:    Medical: Not on file    Non-medical: Not on file  Tobacco Use   Smoking status: Current Every Day Smoker    Packs/day: 0.15    Types: Cigarettes    Last attempt to quit: 01/21/2017    Years since quitting: 2.0   Smokeless tobacco: Never Used  Substance and Sexual Activity   Alcohol use: No    Alcohol/week: 0.0 standard drinks   Drug use: No   Sexual activity: Never  Lifestyle   Physical activity:    Days per week: Not on file    Minutes per session: Not on file   Stress: Not on file  Relationships   Social connections:    Talks on phone: Not on file    Gets together: Not on file    Attends religious service: Not on file    Active member of club or organization: Not on file    Attends meetings of clubs or organizations: Not on file    Relationship status: Not on file  Other Topics Concern   Not on file  Social History Narrative   Lives alone. 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 Has phobia of the medical system.   Family History  Problem Relation Age of Onset   Cancer Mother        breast   Cancer Sister        breast   Scheduled Meds: Continuous Infusions: PRN Meds:. Medications Prior to Admission:  Prior to Admission medications   Medication Sig Start Date End Date Taking? Authorizing Provider  aspirin EC 81 MG tablet Take 1 tablet (81 mg total) by mouth daily. 08/01/17   [name], MD  chlorhexidine (PERIDEX) 0.12 % solution RINSE AND GARGLE 15 ML BY MOUTH OR THROAT TWICE DAILY AS DIRECTED AS NEEDED Patient taking differently: Use as directed 15 mLs in the mouth or throat 2 (two) times daily as needed (mouth rinse after vomiting). Swish and spit 11/30/18   [name], MD  dexamethasone (DECADRON) 4  MG tablet Take 1 tablet (4 mg total) by mouth daily. 01/28/19   Georgette Shell, MD  escitalopram (LEXAPRO) 20 MG tablet TAKE 1 TABLET(20 MG) BY MOUTH DAILY 01/30/19   [name], MD  feeding supplement, ENSURE ENLIVE, (ENSURE ENLIVE) LIQD Take 237 mLs by mouth 2 (two) times daily between meals. Patient taking differently: Take 237 mLs by mouth daily.  07/22/18  Sheikh, Omair Latif, DO  hydrOXYzine (ATARAX/VISTARIL) 10 MG tablet Take 10 mg by mouth 2 (two) times daily. 01/01/19   [provider]  levETIRAcetam (KEPPRA) 1000 MG tablet Take 1 tablet (1,000 mg total) by mouth 2 (two) times daily. 01/28/19   Georgette Shell, MD  pantoprazole (PROTONIX) 40 MG tablet TAKE 1 TABLET(40 MG) BY MOUTH DAILY Patient taking differently: Take 40 mg by mouth daily.  01/09/19   [name], MD  Vitamin D, Ergocalciferol, (DRISDOL) 1.25 MG (50000 UT) CAPS capsule TAKE 1 CAPSULE BY MOUTH 3 TIMES WEEKLY 02/05/19   [name], MD   Allergies  Allergen Reactions   Thorazine [Chlorpromazine] Anaphylaxis   Ribociclib Other (See Comments)    Exfoliative dermatitis   Review of Systems  Constitutional: Positive for appetite change and fatigue.  Neurological: Positive for weakness.    Physical Exam  Vital Signs: There were no vitals taken for this visit.         SpO2:   O2 Device:  O2 Flow Rate: .   IO: Intake/output summary: No intake or output data in the 24 hours ending 02/13/19 1554  LBM:   Baseline Weight:   Most recent weight:       Palliative Assessment/Data: 70%     Time In: 1300 Time Out: 1400 Time Total: 60 minutes Greater than 50%  of this time was spent counseling and coordinating care related to the above assessment and plan.  Signed by: Wadie Lessen, NP   Please contact Palliative Medicine Team phone at [phone] for questions and concerns.  For individual provider: See Shea Evans

## 2019-02-14 ENCOUNTER — Ambulatory Visit
Admission: RE | Admit: 2019-02-14 | Discharge: 2019-02-14 | Disposition: A | Discharge status: Home / Self Care | Payer: BC Managed Care – PPO | Source: Ambulatory Visit | Attending: Radiation Oncology | Admitting: Radiation Oncology

## 2019-02-14 ENCOUNTER — Ambulatory Visit: Payer: BC Managed Care – PPO

## 2019-02-14 ENCOUNTER — Ambulatory Visit: Payer: BC Managed Care – PPO | Admitting: Urology

## 2019-02-14 ENCOUNTER — Other Ambulatory Visit: Payer: Self-pay

## 2019-02-14 VITALS — BP 109/82 | HR 76 | Temp 98.2°F | Resp 20 | Ht 65.0 in | Wt 161.0 lb

## 2019-02-14 DIAGNOSIS — Z803 Family history of malignant neoplasm of breast: Secondary | ICD-10-CM | POA: Diagnosis not present

## 2019-02-14 DIAGNOSIS — Z66 Do not resuscitate: Secondary | ICD-10-CM | POA: Diagnosis not present

## 2019-02-14 DIAGNOSIS — Z7982 Long term (current) use of aspirin: Secondary | ICD-10-CM | POA: Diagnosis not present

## 2019-02-14 DIAGNOSIS — C7949 Secondary malignant neoplasm of other parts of nervous system: Principal | ICD-10-CM

## 2019-02-14 DIAGNOSIS — Z510 Encounter for antineoplastic radiation therapy: Secondary | ICD-10-CM | POA: Diagnosis not present

## 2019-02-14 DIAGNOSIS — C7931 Secondary malignant neoplasm of brain: Secondary | ICD-10-CM

## 2019-02-14 DIAGNOSIS — F17210 Nicotine dependence, cigarettes, uncomplicated: Secondary | ICD-10-CM | POA: Diagnosis not present

## 2019-02-14 DIAGNOSIS — Z888 Allergy status to other drugs, medicaments and biological substances status: Secondary | ICD-10-CM | POA: Diagnosis not present

## 2019-02-14 DIAGNOSIS — C50912 Malignant neoplasm of unspecified site of left female breast: Secondary | ICD-10-CM | POA: Diagnosis present

## 2019-02-14 DIAGNOSIS — Z91040 Latex allergy status: Secondary | ICD-10-CM | POA: Diagnosis not present

## 2019-02-14 DIAGNOSIS — Z79899 Other long term (current) drug therapy: Secondary | ICD-10-CM | POA: Diagnosis not present

## 2019-02-14 MED ORDER — HEPARIN SOD (PORK) LOCK FLUSH 100 UNIT/ML IV SOLN
500.0000 [IU] | Freq: Once | INTRAVENOUS | Status: AC
  Administered 2019-02-14: 500 [IU] via INTRAVENOUS

## 2019-02-14 MED ORDER — SODIUM CHLORIDE 0.9% FLUSH
10.0000 mL | INTRAVENOUS | Status: AC
  Administered 2019-02-14: 10 mL via INTRAVENOUS

## 2019-02-14 NOTE — Progress Notes (Signed)
Has armband been applied?  Yes.    Does patient have an allergy to IV contrast dye?: No.   Has patient ever received premedication for IV contrast dye?: No.   Does patient take metformin?: No.  If patient does take metformin when was the last dose: n/a  Date of lab work:    IV site: subclavian right, condition no redness, patent  Has IV site been added to flowsheet?  Yes.    BP 109/82 (BP Location: Right Arm, Patient Position: Sitting)   Pulse 76   Temp 98.2 F (36.8 C) (Oral)   Resp 20   Ht 5\' 5"  (1.651 m)   Wt 161 lb (73 kg)   SpO2 100%   BMI 26.79 kg/m

## 2019-02-14 NOTE — Progress Notes (Signed)
Flush power port per protocol. Deaccessed port. Access needle intact upon removal. Applied a bandaid to old access site. Patient tolerated well. Patient discharged home ambulatory in no distress.

## 2019-02-15 ENCOUNTER — Telehealth: Payer: Self-pay | Admitting: Radiation Oncology

## 2019-02-15 ENCOUNTER — Ambulatory Visit: Payer: BC Managed Care – PPO

## 2019-02-15 ENCOUNTER — Ambulatory Visit: Payer: BC Managed Care – PPO | Admitting: Radiation Oncology

## 2019-02-15 DIAGNOSIS — Z66 Do not resuscitate: Secondary | ICD-10-CM | POA: Insufficient documentation

## 2019-02-15 DIAGNOSIS — Z515 Encounter for palliative care: Secondary | ICD-10-CM | POA: Insufficient documentation

## 2019-02-15 NOTE — Telephone Encounter (Signed)
Received voicemail message from patient's sister, Baruch Goldmann, requesting a return call and update on her sister. Returned [facility] call. Explained her sister has been set up for five fractions of radiosurgery to address the area of enhancement along the dura. Discussed follow up care. Answered all questions to the best of my ability. Claiborne Billings verbalized understanding and expressed appreciation for the return call.

## 2019-02-16 DIAGNOSIS — Z510 Encounter for antineoplastic radiation therapy: Secondary | ICD-10-CM | POA: Diagnosis not present

## 2019-02-16 NOTE — Progress Notes (Signed)
  Radiation Oncology         [phone] ________________________________  Name: Stacey Cox MRN: [id number]  Date: 02/14/2019  DOB: 10-02-1956  SIMULATION AND TREATMENT PLANNING NOTE  No diagnosis found.  DIAGNOSIS:  63 y.o. 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  Identity was confirmed.  All relevant records and images related to the planned course of therapy were reviewed.  The patient freely provided informed written consent to proceed with treatment after reviewing the details related to the planned course of therapy. The consent form was witnessed and verified by the simulation staff. Intravenous access was established for contrast administration. Then, the patient was set-up in a stable reproducible supine position for radiation therapy.  A relocatable thermoplastic stereotactic head frame was fabricated for precise immobilization.  CT images were obtained.  Surface markings were placed.  The CT images were loaded into the planning software and fused with the patient's targeting MRI scan.  Then the target and avoidance structures were contoured.  Treatment planning then occurred.  The radiation prescription was entered and confirmed.  I have requested 3D planning  I have requested a DVH of the following structures: Brain stem, brain, left eye, right eye, lenses, optic chiasm, target volumes, uninvolved brain, and normal tissue.    SPECIAL TREATMENT PROCEDURE:  The planned course of therapy using radiation constitutes a special treatment procedure. Special care is required in the management of this patient for the following reasons. This treatment constitutes a Special Treatment Procedure for the following reason: High dose per fraction requiring special monitoring for increased toxicities of treatment including daily imaging.  The special nature of the planned course of radiotherapy  will require increased physician supervision and oversight to ensure patient's safety with optimal treatment outcomes.  PLAN:  The patient will receive 25 Gy in 5 fractions.  ________________________________  Sheral Apley Tammi Klippel, M.D.  This document serves as a record of services personally performed by Tyler Pita, MD. It was created on his behalf by Wilburn Mylar, a trained medical scribe. The creation of this record is based on the scribe's personal observations and the provider's statements to them. This document has been checked and approved by the attending provider.

## 2019-02-16 NOTE — Progress Notes (Signed)
Patient was contacted by Mont Dutton, RRT, brain navigator to inform that after reviewing her recent MRI scans in brain conference and personally with Dr. Tammi Klippel, the recommendation is to proceed with a 5 fraction course of SRS to the progressive dural disease.  She is scheduled for CT SIM on 02/14/19 with plans to begin treatment on 02/20/19.  She will continue taking Decadron 2mg  po daily as prescribed by Dr. Mickeal Skinner and will continue Keppra 1000 mg BID for seizure prophylaxis.  All questions were answered to her stated satisfaction. She appeas to have a good understanding of the recommendations and is in agreement with the stated plan.  She knows to call at any time in the interim with any questions or concerns.  Nicholos Johns, MMS, PA-C [facility] [place]: [phone]  Fax: [phone]

## 2019-02-19 ENCOUNTER — Other Ambulatory Visit: Payer: Self-pay

## 2019-02-19 ENCOUNTER — Ambulatory Visit: Payer: Self-pay | Admitting: Hematology

## 2019-02-19 ENCOUNTER — Inpatient Hospital Stay: Payer: BC Managed Care – PPO

## 2019-02-19 VITALS — BP 108/78 | Temp 98.1°F | Resp 18

## 2019-02-19 DIAGNOSIS — Z5111 Encounter for antineoplastic chemotherapy: Secondary | ICD-10-CM | POA: Diagnosis not present

## 2019-02-19 DIAGNOSIS — Z95828 Presence of other vascular implants and grafts: Secondary | ICD-10-CM

## 2019-02-19 DIAGNOSIS — C7951 Secondary malignant neoplasm of bone: Secondary | ICD-10-CM

## 2019-02-19 DIAGNOSIS — Z7189 Other specified counseling: Secondary | ICD-10-CM

## 2019-02-19 DIAGNOSIS — C50919 Malignant neoplasm of unspecified site of unspecified female breast: Secondary | ICD-10-CM

## 2019-02-19 LAB — CBC WITH DIFFERENTIAL/PLATELET
Abs Immature Granulocytes: 0.06 10*3/uL (ref 0.00–0.07)
Basophils Absolute: 0 10*3/uL (ref 0.0–0.1)
Basophils Relative: 0 %
Eosinophils Absolute: 0.1 10*3/uL (ref 0.0–0.5)
Eosinophils Relative: 1 %
HCT: 45.1 % (ref 36.0–46.0)
HEMOGLOBIN: 14.3 g/dL (ref 12.0–15.0)
Immature Granulocytes: 1 %
Lymphocytes Relative: 17 %
Lymphs Abs: 1.3 10*3/uL (ref 0.7–4.0)
MCH: 28.9 pg (ref 26.0–34.0)
MCHC: 31.7 g/dL (ref 30.0–36.0)
MCV: 91.1 fL (ref 80.0–100.0)
Monocytes Absolute: 0.5 10*3/uL (ref 0.1–1.0)
Monocytes Relative: 6 %
Neutro Abs: 5.8 10*3/uL (ref 1.7–7.7)
Neutrophils Relative %: 75 %
Platelets: 139 10*3/uL — ABNORMAL LOW (ref 150–400)
RBC: 4.95 MIL/uL (ref 3.87–5.11)
RDW: 17.1 % — ABNORMAL HIGH (ref 11.5–15.5)
WBC: 7.7 10*3/uL (ref 4.0–10.5)
nRBC: 0 % (ref 0.0–0.2)

## 2019-02-19 LAB — CMP (CANCER CENTER ONLY)
ALK PHOS: 206 U/L — AB (ref 38–126)
ALT: 107 U/L — AB (ref 0–44)
ANION GAP: 10 (ref 5–15)
AST: 38 U/L (ref 15–41)
Albumin: 3.3 g/dL — ABNORMAL LOW (ref 3.5–5.0)
BUN: 24 mg/dL — ABNORMAL HIGH (ref 8–23)
CALCIUM: 9.5 mg/dL (ref 8.9–10.3)
CO2: 28 mmol/L (ref 22–32)
Chloride: 104 mmol/L (ref 98–111)
Creatinine: 0.8 mg/dL (ref 0.44–1.00)
GFR, Est AFR Am: >60 mL/min (ref >60)
GFR, Estimated: >60 mL/min (ref >60)
Glucose, Bld: 160 mg/dL — ABNORMAL HIGH (ref 70–99)
Potassium: 3.7 mmol/L (ref 3.5–5.1)
Sodium: 142 mmol/L (ref 135–145)
Total Bilirubin: 0.5 mg/dL (ref 0.3–1.2)
Total Protein: 6.8 g/dL (ref 6.5–8.1)

## 2019-02-19 MED ORDER — FULVESTRANT 250 MG/5ML IM SOLN
500.0000 mg | Freq: Once | INTRAMUSCULAR | Status: AC
  Administered 2019-02-19: 500 mg via INTRAMUSCULAR

## 2019-02-19 MED ORDER — DENOSUMAB 120 MG/1.7ML ~~LOC~~ SOLN
120.0000 mg | Freq: Once | SUBCUTANEOUS | Status: AC
  Administered 2019-02-19: 120 mg via SUBCUTANEOUS

## 2019-02-19 MED ORDER — FULVESTRANT 250 MG/5ML IM SOLN
INTRAMUSCULAR | Status: AC
  Filled 2019-02-19: qty 10

## 2019-02-19 MED ORDER — DENOSUMAB 120 MG/1.7ML ~~LOC~~ SOLN
SUBCUTANEOUS | Status: AC
  Filled 2019-02-19: qty 1.7

## 2019-02-20 ENCOUNTER — Other Ambulatory Visit: Payer: Self-pay

## 2019-02-20 ENCOUNTER — Ambulatory Visit
Admission: RE | Admit: 2019-02-20 | Discharge: 2019-02-20 | Disposition: A | Discharge status: Home / Self Care | Payer: BC Managed Care – PPO | Source: Ambulatory Visit | Attending: Radiation Oncology | Admitting: Radiation Oncology

## 2019-02-20 VITALS — BP 132/99 | HR 78 | Temp 98.0°F | Resp 18

## 2019-02-20 DIAGNOSIS — C50919 Malignant neoplasm of unspecified site of unspecified female breast: Secondary | ICD-10-CM

## 2019-02-20 DIAGNOSIS — Z510 Encounter for antineoplastic radiation therapy: Secondary | ICD-10-CM | POA: Diagnosis not present

## 2019-02-20 DIAGNOSIS — C7931 Secondary malignant neoplasm of brain: Principal | ICD-10-CM

## 2019-02-20 LAB — CANCER ANTIGEN 27.29: CA 27.29: 48.9 U/mL — ABNORMAL HIGH (ref 0.0–38.6)

## 2019-02-20 LAB — CANCER ANTIGEN 15-3: CA 15-3: 35.2 U/mL — ABNORMAL HIGH (ref 0.0–25.0)

## 2019-02-20 NOTE — Progress Notes (Signed)
Nurse monitoring complete following initial SRS treatment. Vitals stable. No distress noted. Patient denies onset of new neurologic symptoms. Patient understands to return on Friday for second treatment. Instructed patient to avoid strenuous activity for the next 24 hours. Instructed patient to phone [phone] and request the radiation oncologist on call with any needs tonight related to treatment received today. Patient verbalized understanding of all reviewed. Patient discharged, ambulatory and in no distress.   BP (!) 132/99 (BP Location: Right Arm, Patient Position: Sitting)   Pulse 78   Temp 98 F (36.7 C) (Oral)   Resp 18   SpO2 98%

## 2019-02-20 NOTE — Progress Notes (Signed)
  Radiation Oncology         [phone] ________________________________  Stereotactic Treatment Procedure Note  Name: Stacey Cox MRN: [id number]  Date: 02/20/2019  DOB: 1956/04/11  SPECIAL TREATMENT PROCEDURE  No diagnosis found.  3D TREATMENT PLANNING AND DOSIMETRY:  The patient's radiation plan was reviewed and approved by neurosurgery and radiation oncology prior to treatment.  It showed 3-dimensional radiation distributions overlaid onto the planning CT/MRI image set.  The [facility] for the target structures as well as the organs at risk were reviewed. The documentation of the 3D plan and dosimetry are filed in the radiation oncology EMR.  NARRATIVE:  Stacey Cox was brought to the TrueBeam stereotactic radiation treatment machine and placed supine on the CT couch. The head frame was applied, and the patient was set up for stereotactic radiosurgery.  Neurosurgery was present for the set-up and delivery  SIMULATION VERIFICATION:  In the couch zero-angle position, the patient underwent Exactrac imaging using the Brainlab system with orthogonal KV images.  These were carefully aligned and repeated to confirm treatment position for each of the isocenters.  The Exactrac snap film verification was repeated at each couch angle.  PROCEDURE: Stacey Cox received stereotactic radiosurgery to the following targets: Bilateral brain metastes targets were treated using 5 Rapid Arc VMAT Beams to a fractional prescription dose of 5 Gy to be repeated 5 times for a total of 25 Gy.  ExacTrac registration was performed for each couch angle.  The 100% isodose line was prescribed.  6 MV X-rays were delivered in the flattening filter free beam mode.  STEREOTACTIC TREATMENT MANAGEMENT:  Following delivery, the patient was transported to nursing in stable condition and monitored for possible acute effects.  Vital signs were recorded There were no vitals taken for this visit.. The patient tolerated treatment  without significant acute effects, and was discharged to home in stable condition.    PLAN: Stacey Cox will return on Friday, 02/23/2019, for fraction #2  ________________________________  Sheral Apley. Tammi Klippel, M.D.  This document serves as a record of services personally performed by Tyler Pita, MD. It was created on his behalf by Wilburn Mylar, a trained medical scribe. The creation of this record is based on the scribe's personal observations and the provider's statements to them. This document has been checked and approved by the attending provider.

## 2019-02-23 ENCOUNTER — Ambulatory Visit
Admission: RE | Admit: 2019-02-23 | Discharge: 2019-02-23 | Disposition: A | Discharge status: Home / Self Care | Payer: BC Managed Care – PPO | Source: Ambulatory Visit | Attending: Radiation Oncology | Admitting: Radiation Oncology

## 2019-02-23 ENCOUNTER — Telehealth: Payer: Self-pay | Admitting: Radiation Oncology

## 2019-02-23 ENCOUNTER — Other Ambulatory Visit: Payer: Self-pay | Admitting: Hematology

## 2019-02-23 ENCOUNTER — Other Ambulatory Visit: Payer: Self-pay | Admitting: Urology

## 2019-02-23 ENCOUNTER — Other Ambulatory Visit: Payer: Self-pay

## 2019-02-23 ENCOUNTER — Other Ambulatory Visit: Payer: Self-pay | Admitting: Internal Medicine

## 2019-02-23 DIAGNOSIS — Z91040 Latex allergy status: Secondary | ICD-10-CM | POA: Diagnosis not present

## 2019-02-23 DIAGNOSIS — Z510 Encounter for antineoplastic radiation therapy: Secondary | ICD-10-CM | POA: Insufficient documentation

## 2019-02-23 DIAGNOSIS — Z66 Do not resuscitate: Secondary | ICD-10-CM | POA: Diagnosis not present

## 2019-02-23 DIAGNOSIS — Z79899 Other long term (current) drug therapy: Secondary | ICD-10-CM | POA: Diagnosis not present

## 2019-02-23 DIAGNOSIS — Z803 Family history of malignant neoplasm of breast: Secondary | ICD-10-CM | POA: Insufficient documentation

## 2019-02-23 DIAGNOSIS — Z7982 Long term (current) use of aspirin: Secondary | ICD-10-CM | POA: Insufficient documentation

## 2019-02-23 DIAGNOSIS — C50912 Malignant neoplasm of unspecified site of left female breast: Secondary | ICD-10-CM | POA: Diagnosis present

## 2019-02-23 DIAGNOSIS — Z888 Allergy status to other drugs, medicaments and biological substances status: Secondary | ICD-10-CM | POA: Diagnosis not present

## 2019-02-23 DIAGNOSIS — C50919 Malignant neoplasm of unspecified site of unspecified female breast: Secondary | ICD-10-CM

## 2019-02-23 DIAGNOSIS — F17210 Nicotine dependence, cigarettes, uncomplicated: Secondary | ICD-10-CM | POA: Diagnosis not present

## 2019-02-23 DIAGNOSIS — C7931 Secondary malignant neoplasm of brain: Secondary | ICD-10-CM

## 2019-02-23 MED ORDER — LEVETIRACETAM 1000 MG PO TABS: 1000.0000 mg | ORAL_TABLET | Freq: Two times a day (BID) | ORAL | 0 refills | Status: DC

## 2019-02-23 MED ORDER — DEXAMETHASONE 4 MG PO TABS: 4.0000 mg | ORAL_TABLET | Freq: Every day | ORAL | 0 refills | Status: DC

## 2019-02-23 NOTE — Telephone Encounter (Signed)
Received communication from [place], [place] that patient is requesting a refill of her seizure medicine and inquiring about what medication to take for a headache. On the third attempt this RN was able to reach the patient by phone. Explained to the patient that Ashlyn Bruning, PA-C refilled her Keppra at the same dose prescribed by Dr. Mickeal Skinner. Explained there were no refills given because the preference is that Dr. Mickeal Skinner manage this medication but we certainly didn't want her to be without it over the weekend. Inquired further about headaches. Patient reports an intermittent headache "a 6 or 7 when it comes on." Denies waking up with a headache. Patient confirmed she is taking decadron 4 mg daily as directed by Dr. Mickeal Skinner and has just enough pills to make it through the weekend. She understands this RN will reach out to Dr. Mickeal Skinner to make him aware of her headaches and need for decadron refill/whatever he sees fit at this time. Advised patient to take Tylenol (per directions on bottle) and rest in a cool dark room to manage headaches for now. Patient denies any evidence of thrush at this time. Patient verbalized understanding of all reviewed and expressed appreciation for the call. However, this RN noted short term recall is poor.

## 2019-02-24 NOTE — Addendum Note (Signed)
Encounter addended by: Tyler Pita, MD on: 02/24/2019 1:56 PM  Actions taken: Medication List reviewed, Problem List reviewed, Allergies reviewed

## 2019-02-27 ENCOUNTER — Ambulatory Visit
Admission: RE | Admit: 2019-02-27 | Discharge: 2019-02-27 | Disposition: A | Discharge status: Home / Self Care | Payer: BC Managed Care – PPO | Source: Ambulatory Visit | Attending: Radiation Oncology | Admitting: Radiation Oncology

## 2019-02-27 ENCOUNTER — Other Ambulatory Visit: Payer: Self-pay

## 2019-02-27 VITALS — BP 112/90 | HR 70 | Temp 98.7°F | Resp 18

## 2019-02-27 DIAGNOSIS — Z510 Encounter for antineoplastic radiation therapy: Secondary | ICD-10-CM | POA: Diagnosis not present

## 2019-02-27 DIAGNOSIS — C50919 Malignant neoplasm of unspecified site of unspecified female breast: Secondary | ICD-10-CM

## 2019-02-27 DIAGNOSIS — C7931 Secondary malignant neoplasm of brain: Principal | ICD-10-CM

## 2019-02-27 NOTE — Progress Notes (Signed)
Nurse monitoring complete following SRS treatment. Vitals stable. No distress noted. Patient denies onset of new neurologic symptoms. Patient reports persistent intermittent dizziness. Denies recent falls. Reports taking decadron half of a 4 mg tablet daily as instructed by Dr. Mickeal Skinner. Patient unable to recall if decadron has been tapered recently. No evidence of thrush noted. Patient confirms she picked up [place] script and continues to take it as prescribed. Patient unconcerned about dizziness but understands this RN plans to ensure Dr. Mickeal Skinner is aware. Instructed patient to avoid strenuous activity for the next 24 hours. Instructed patient to phone [phone] and request the radiation oncologist on call with any needs tonight related to treatment received today. Patient verbalized understanding of all reviewed. Patient discharged via wheelchair and in no distress.

## 2019-02-28 ENCOUNTER — Telehealth: Payer: Self-pay | Admitting: Radiation Oncology

## 2019-02-28 NOTE — Telephone Encounter (Signed)
Received refill request form for levetiracetam from [facility]. Denied refill and encouraged the pharmacy to contact Dr. Cecil Cobbs. Fax confirmation of delivery obtained.

## 2019-03-02 ENCOUNTER — Other Ambulatory Visit: Payer: Self-pay

## 2019-03-02 ENCOUNTER — Other Ambulatory Visit: Payer: Self-pay | Admitting: Internal Medicine

## 2019-03-02 ENCOUNTER — Ambulatory Visit
Admission: RE | Admit: 2019-03-02 | Discharge: 2019-03-02 | Disposition: A | Discharge status: Home / Self Care | Payer: BC Managed Care – PPO | Source: Ambulatory Visit | Attending: Radiation Oncology | Admitting: Radiation Oncology

## 2019-03-02 DIAGNOSIS — C50919 Malignant neoplasm of unspecified site of unspecified female breast: Secondary | ICD-10-CM

## 2019-03-02 DIAGNOSIS — Z510 Encounter for antineoplastic radiation therapy: Secondary | ICD-10-CM | POA: Diagnosis not present

## 2019-03-02 DIAGNOSIS — C7931 Secondary malignant neoplasm of brain: Principal | ICD-10-CM

## 2019-03-02 MED ORDER — DEXAMETHASONE 1 MG PO TABS: 1.0000 mg | ORAL_TABLET | Freq: Every day | ORAL | 2 refills | Status: DC

## 2019-03-02 NOTE — Progress Notes (Signed)
Nurse monitoring complete following 4 of 5 intended SRS treatments. Vitals stable. No distress noted. Patient denies onset of new neurologic symptoms. Patient denies dizziness today. Reports she continues to take decadron 2 mg daily as instructed by Dr. Mickeal Skinner. No evidence of thrush noted. Escorted patient to room 10 in medical oncology to see Dr. Mickeal Skinner. Slow steady gait noted. Care of patient handed over to Shelle Iron, Therapist, sports. Patient understands to return on Tuesday for final SRS treatment. Instructed patient to avoid strenuous activity for the next 24 hours. Instructed patient to phone [phone] and request the radiation oncologist on call with any needs tonight related to treatment received today. Patient verbalized understanding of all reviewed.

## 2019-03-06 ENCOUNTER — Other Ambulatory Visit: Payer: Self-pay

## 2019-03-06 ENCOUNTER — Ambulatory Visit
Admission: RE | Admit: 2019-03-06 | Discharge: 2019-03-06 | Disposition: A | Discharge status: Home / Self Care | Payer: BC Managed Care – PPO | Source: Ambulatory Visit | Attending: Radiation Oncology | Admitting: Radiation Oncology

## 2019-03-06 ENCOUNTER — Encounter: Payer: Self-pay | Admitting: Radiation Oncology

## 2019-03-06 VITALS — BP 122/88 | HR 90 | Temp 98.0°F | Resp 18

## 2019-03-06 DIAGNOSIS — C7931 Secondary malignant neoplasm of brain: Secondary | ICD-10-CM

## 2019-03-06 DIAGNOSIS — Z510 Encounter for antineoplastic radiation therapy: Secondary | ICD-10-CM | POA: Diagnosis not present

## 2019-03-06 DIAGNOSIS — C7949 Secondary malignant neoplasm of other parts of nervous system: Principal | ICD-10-CM

## 2019-03-06 NOTE — Progress Notes (Signed)
Nurse monitoring complete following 5 of 5 intended SRS treatments. Vitals stable. No distress noted. Patient denies onset of new neurologic symptoms. Patient denies dizziness today. Reports she continues to take decadron 2 mg daily as instructed by Dr. Mickeal Skinner. No evidence of thrush noted.  Slow steady gait noted. Instructed patient to avoid strenuous activity for the next 24 hours. Instructed patient to phone [phone] and request the radiation oncologist on call with any needs tonight related to treatment received today. Patient verbalized understanding of all reviewed. Patient left clinic ambulatory with intent to go and get a [employer] milkshake.   BP 122/88   Pulse 90   Temp 98 F (36.7 C)   Resp 18   SpO2 100%

## 2019-03-09 ENCOUNTER — Ambulatory Visit: Payer: Self-pay

## 2019-03-12 NOTE — Progress Notes (Signed)
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  INTERVAL HISTORY   Stacey Cox is here for follow-up of her metastatic hormone positive HER-2 negative breast cancer. The patient's last visit with [place] was on 01/22/19. The pt is accompanied today by her friend Layla, via [employer]. The pt reports that she is doing well overall.  In the interim, the pt has been pursuing SRS of 25 Gy with my colleague Dr. Tyler Pita in [place], which began on 02/20/19 and ended on 03/05/19. She also saw my colleague Dr. Mickeal Skinner in Neuro-Onc on 02/02/19.  The pt reports that she has continued to have left forearm swelling, and has been using a compression sleeve- denies leg swelling. The pt notes that she does have some headaches, which "don't last super long," and takes a couple Tylenol for these, which she notes is infrequent. The pt notes that she "finds herself losing words, not being able to find the right word." She denies changes in fluency or pronunciation. The pt denies any seizures in the interim, and is now on 26m Decadron daily and increased to 10032mKeppra BID. She denies having falls but notes that she has sensed some instability, which she notices while trying to turn around quickly or other quick movements.   The pt notes that she has been eating very well and has gained 13 pounds in the last 6 weeks. The pt notes that she is cooking and having food delivered and denies abdominal pains.  Of note since the patient's last visit, pt has had an MRI Brain completed on 02/02/19 with results revealing Marked progression of dural disease left greater than right as described above. Pronounced brain edema, left more than right with  left-to-right shift of 1 cm. 2. I do not identify any brain parenchymal metastases. 3. Small stable incidental meningiomas in the posterior fossa as above.  Lab results today (03/19/19) of CBC w/diff and CMP is as follows: all values are WNL except for RDW at 18.0, PLT at 149k, Abs immature granulocytes at 0.18k, Glucose at 101, Albumin at 3.1, AST at 76, ALT at 133, Alk Phos at 361  On review of systems, pt reports mild headaches, word-finding changes, good appetite, eating well, weight gain, left forearm swelling,  and denies falls, seizures, abdominal pains, SOB, leg swelling, and any other symptoms.    MEDICAL HISTORY:  Past [facility] History:  [facility]   [facility]   [facility]    SURGICAL HISTORY: Past Surgical History:  Procedure Laterality Date   CHOLECYSTECTOMY     IR IMAGING GUIDED PORT INSERTION  07/14/2018   TONSILLECTOMY      SOCIAL HISTORY: Social History   Socioeconomic History   Marital status: Single    Spouse name: Not on file   Number of children: Not on file   Years of education: Not on file   Highest education level: Not on file  Occupational History   Not on file  Social Needs   Financial resource strain: Not on file   Food insecurity:    Worry: Not on file    Inability: Not on file   Transportation needs:    Medical: Not on file  Non-medical: Not on file  Tobacco Use   Smoking status: Current Every Day Smoker    Packs/day: 0.15    Types: Cigarettes    Last attempt to quit: 01/21/2017    Years since quitting: 2.1   Smokeless tobacco: Never Used  Substance and Sexual Activity   Alcohol use: No    Alcohol/week: 0.0 standard drinks   Drug use: No   Sexual activity: Never  Lifestyle   Physical activity:    Days per week: Not on file    Minutes per session: Not on file   Stress: Not on file  Relationships   Social connections:    Talks on phone: Not on file    Gets together: Not on file    Attends  religious service: Not on file    Active member of club or organization: Not on file    Attends meetings of clubs or organizations: Not on file    Relationship status: Not on file   Intimate partner violence:    Fear of current or ex partner: No    Emotionally abused: No    Physically abused: No    Forced sexual activity: No  Other Topics Concern   Not on file  Social History Narrative   Lives alone. 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 Has phobia of the medical system.  Cigarette smoker 1/2 PPD for about 40 yrs Social alcohol use No drugs Professor at [employer] no children  FAMILY HISTORY: Family History  Problem Relation Age of Onset   Cancer Mother        breast   Cancer Sister        breast  Mother with h/o breast cancer in her 40's Sister with breast cancer in her 64ys and later was diagnosed with multiple myeloma. (Patient is not aware of any specific breast cancer mutations present)  ALLERGIES:  is allergic to thorazine [chlorpromazine] and ribociclib.  MEDICATIONS:  Current Outpatient Medications  Medication Sig Dispense Refill   aspirin EC 81 MG tablet Take 1 tablet (81 mg total) by mouth daily.     chlorhexidine (PERIDEX) 0.12 % solution Use as directed 15 mLs in the mouth or throat 2 (two) times daily as needed (mouth rinse after vomiting). Swish and spit 473 mL 1   dexamethasone (DECADRON) 1 MG tablet Take 1 tablet (1 mg total) by mouth daily. 30 tablet 2   escitalopram (LEXAPRO) 20 MG tablet TAKE 1 TABLET(20 MG) BY MOUTH DAILY 30 tablet 0   feeding supplement, ENSURE ENLIVE, (ENSURE ENLIVE) LIQD Take 237 mLs by mouth 2 (two) times daily between meals. (Patient taking differently: Take 237 mLs by mouth daily. ) 237 mL 12   hydrOXYzine (ATARAX/VISTARIL) 10 MG tablet Take 10 mg by mouth 2 (two) times daily.     levETIRAcetam (KEPPRA) 1000 MG tablet Take 1 tablet (1,000 mg total) by mouth 2 (two) times daily. 60 tablet 0   pantoprazole (PROTONIX) 40 MG  tablet TAKE 1 TABLET(40 MG) BY MOUTH DAILY 30 tablet 1   Vitamin D, Ergocalciferol, (DRISDOL) 1.25 MG (50000 UT) CAPS capsule TAKE 1 CAPSULE BY MOUTH 3 TIMES WEEKLY 24 capsule 0   No current facility-administered medications for this visit.    Facility-Administered Medications Ordered in Other Visits  Medication Dose Route Frequency Provider Last Rate Last Dose   denosumab (XGEVA) injection 120 mg  120 mg Subcutaneous Once [name], MD       fulvestrant (FASLODEX) injection 500 mg  500 mg Intramuscular  Once [name], MD        REVIEW OF SYSTEMS:    A 10+ POINT REVIEW OF SYSTEMS WAS OBTAINED including neurology, dermatology, psychiatry, cardiac, respiratory, lymph, extremities, GI, GU, Musculoskeletal, constitutional, breasts, reproductive, HEENT.  All pertinent positives are noted in the HPI.  All others are negative.   PHYSICAL EXAMINATION:  ECOG PERFORMANCE STATUS: 1 - Symptomatic but completely ambulatory  Vitals:   03/19/19 0956  BP: 115/85  Pulse: 85  Resp: 18  Temp: 98.4 F (36.9 C)  SpO2: 100%   Filed Weights   03/19/19 0956  Weight: 170 lb 11.2 oz (77.4 kg)   Body mass index is 28.41 kg/m.  GENERAL:alert, in no acute distress and comfortable SKIN: no acute rashes, no significant lesions EYES: conjunctiva are pink and non-injected, sclera anicteric OROPHARYNX: MMM, no exudates, no oropharyngeal erythema or ulceration NECK: supple, no JVD LYMPH:  no palpable lymphadenopathy in the cervical, axillary or inguinal regions LUNGS: clear to auscultation b/l with normal respiratory effort HEART: regular rate & rhythm ABDOMEN:  normoactive bowel sounds , non tender, not distended. No palpable hepatosplenomegaly.  Extremity: no pedal edema PSYCH: alert & oriented x 3 with fluent speech NEURO: no focal motor/sensory deficits   LABORATORY DATA:  I have reviewed the data as listed  CBC Latest Ref Rng & Units 03/19/2019 02/19/2019 01/26/2019  WBC 4.0  - 10.5 K/uL 8.1 7.7 7.1  Hemoglobin 12.0 - 15.0 g/dL 14.2 14.3 13.3  Hematocrit 36.0 - 46.0 % 43.1 45.1 40.6  Platelets 150 - 400 K/uL 149(L) 139(L) 167   CBC    Component Value Date/Time   WBC 8.1 03/19/2019 0927   RBC 5.01 03/19/2019 0927   HGB 14.2 03/19/2019 0927   HGB 10.2 (L) 10/10/2018 0823   HGB 13.9 11/24/2017 1340   HCT 43.1 03/19/2019 0927   HCT 42.0 11/24/2017 1340   PLT 149 (L) 03/19/2019 0927   PLT 182 10/10/2018 0823   PLT 212 11/24/2017 1340   MCV 86.0 03/19/2019 0927   MCV 97.0 11/24/2017 1340   MCH 28.3 03/19/2019 0927   MCHC 32.9 03/19/2019 0927   RDW 18.0 (H) 03/19/2019 0927   RDW 14.1 11/24/2017 1340   LYMPHSABS 1.2 03/19/2019 0927   LYMPHSABS 1.4 11/24/2017 1340   MONOABS 0.8 03/19/2019 0927   MONOABS 0.3 11/24/2017 1340   EOSABS 0.1 03/19/2019 0927   EOSABS 0.1 11/24/2017 1340   BASOSABS 0.1 03/19/2019 0927   BASOSABS 0.1 11/24/2017 1340    . CMP Latest Ref Rng & Units 03/19/2019 02/19/2019 01/26/2019  Glucose 70 - 99 mg/dL 101(H) 160(H) -  BUN 8 - 23 mg/dL 18 24(H) -  Creatinine 0.44 - 1.00 mg/dL 0.79 0.80 0.60  Sodium 135 - 145 mmol/L 143 142 -  Potassium 3.5 - 5.1 mmol/L 3.5 3.7 -  Chloride 98 - 111 mmol/L 106 104 -  CO2 22 - 32 mmol/L 26 28 -  Calcium 8.9 - 10.3 mg/dL 9.6 9.5 -  Total Protein 6.5 - 8.1 g/dL 6.8 6.8 -  Total Bilirubin 0.3 - 1.2 mg/dL 0.4 0.5 -  Alkaline Phos 38 - 126 U/L 361(H) 206(H) -  AST 15 - 41 U/L 76(H) 38 -  ALT 0 - 44 U/L 133(H) 107(H) -   07/18/18 Pathology:          RADIOGRAPHIC STUDIES: I have personally reviewed the radiological images as listed and agreed with the findings in the report. No results found.  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 Multifocal tumor in the left breast with biopsy-proven left axillary metastases.   Noted to have extensive bone metastases and pulmonary  metastases. Patient was noted to have calvarial metastases but no overt parenchymal metastasis 09/14/17 CT Chest/ad/pelvis Results discussed in details - good response.  02/22/18 CT revealed  Newly apparent 2.1 by 2.0 cm rim enhancing lesion in the right hepatic lobe suspicious for a metastatic lesion. This was not apparent on prior exams although the prior exams were all noncontrast and thus the lesion may have been present but with reduced conspicuity. There is also a small enhancing lesion further posteriorly in the right hepatic lobe which is technically nonspecific and could be a small hemangioma or a small metastatic lesion. Stable distribution and appearance of prior sclerotic osseous metastatic disease without bony progression. 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   07/06/18 CT C/A/P which revealed New pattern of heterogeneous enhancement and enhancing ill-defined lesions in the central LEFT and RIGHT hepatic lobe at site prior enhancing lesion is highly concerning for progression of of infiltrative malignancy in the liver occupying a large portion of the RIGHT hepatic lobe and central LEFT hepatic lobe. 2. Small amount of free fluid in the abdomen pelvis. 3. No metastatic adenopathy.4. No evidence pulmonary metastasis or lymphadenopathy. 5. Stable dense sclerotic skeletal metastasis   07/11/18 ECHO revealed LV EF of 55%-60%   07/18/18 Liver needle/core biopsy right lesion consi with Ki67 at 40%, ER/PR +ve   07/28/18 MRI Abdomen MRCP revealed Extensive hepatic metastatic disease, possibly progressive from recent abdominal CT. 2. No evidence of intrahepatic or extrahepatic biliary dilatation. Elevated liver function studies may be secondary to intrahepatic cholestasis. 3. Increased ascites without peritoneal nodularity or suspicious enhancement. 4. Grossly stable widespread osseous metastatic disease.   S/p 6 cycles of AC  completed on 11/01/18  #2 Bone metastases due to breast cancer- on Xgeva. Much improved back pain.  # 3 Neutropenia Related to her Ribociclib - resolved. Patient is currently on Verzenio and has not developed any neutropenia. This is being monitored.  #4 Increased LFTs due to new liver metastases  #5 Cancer related pain - controlled - status post palliative RT to spine - no currently needing any pain medications.  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 No other new medication. Now resolved. Monitoring on increasing doses of Verzenio. No issues with recurrent rash on Verzenio 155m po BID  #7 Brain metastasis  07/20/18 CT Head revealed 1.8 x 2.4 x 3.9 cm LEFT frontal cortical based mass with possible dural component highly concerning for metastatic disease, less likely abscess or cerebritis. Severe vasogenic edema with 5 mm LEFT to RIGHT subfalcine herniation. No ventricular entrapment. 2. 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 3. Multiple calvarial metastasis. 4. Cerebellar atrophy.    07/21/18 MRI Brain revealed  Irregular plaque-like mass, likely metastasis, measuring up to 5.1 cm, appears to originate from the left anterolateral frontal dura with invasion of the underlying frontal lobe of the brain. Mass effect from brain edema and the lesion partially effaces the frontal horn of left lateral ventricle and results in 7 mm left-to-right midline shift of anterior septum pellucidum. 2. Diffuse dural thickening over the left cerebral convexity may represent edema associated with  the tumor or invasive neoplasm. 3. 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   07/28/18 MRI Brain revealed Extensive nodular dural enhancement left frontal lobe with associated enhancing mass growing in the left frontal lobe is unchanged in size and compatible with metastatic disease. This is associated with  metastatic disease to the left frontal bone. Extensive edema in the left frontal lobe has progressed in the interval. Blastic metastatic disease throughout the calvarium. Metastatic disease in the cervical spine. Enhancing mass along the floor of the posterior fossa on the right is stable and most consistent with meningioma. 6 mm enhancing mass in the prepontine cistern on the right also unchanged and compatible with meningioma.   s/p SRS radiation therapy  10/23/18 MRI Brain revealed SRS protocol demonstrating treatment response: Regression of LEFT frontal dural metastasis, decreased vasogenic edema and parenchymal invasion. Similar calvarial metastasis. 2. Stable appearance of 2 small posterior fossa meningiomas. 3. No acute intracranial process.   PLAN:  -Continue follow up with Dr. Mickeal Skinner in Neuro-oncology on 02/06/2019 and Dr. Tammi Klippel in [place] with Delton See every 4 weeks   #8 Vitamin D deficiency Vit D levels improved to 44.8  On 12/01/2018 -continue Vit D replacement.  #9 h/o Left upper extremity swelling- [place] neg for DVT - now swelling resolved.  PLAN:  -Discussed pt labwork today, 03/19/19; blood counts are stable. Liver enzymes increased with AST to 76, ALT to 133, and Alk Phos to 361. -03/19/19 CA27.29 and CA15-3 are showing progressive increase.. pLast available markers from 02/19/19 revealed CA27.29 increased to 48.9 and CA15-3 increased to 35.2 -Discussed and reviewed the 02/02/19 MRI Brain which revealed Marked progression of dural disease left greater than right as described above. Pronounced brain edema, left more than right with left-to-right shift of 1 cm. 2. I do not identify any brain parenchymal metastases. 3. Small stable incidental meningiomas in the posterior fossa as above. -patient is s/p SRS for dural metastases. -Discussed that I would recommend re-scanning pt radiographically urgently as the patient's liver enzymes have increased again, alongside increasing tumor  markers -Discussed my concern that the impending imaging may confirm a necessity to return to treatment, and a likely recommendation of Taxol- which the pt agrees with and prefers. -Will tentatively start Taxol in 10 days for next line of palliative chemotherapy in the setting of likely progression of liver mets and possibly other sites of  Disease -CT chest/abd/pelvis ASAP -Patient to continue with Art Therapy and outpatient PT -Continue Keppra . -on tapering dexamethasone as per Dr Mickeal Skinner - currently on 95m p daily -Continue 20MEQ Potassium PO BID replacement  -Continue Ergocalciferol 50k units, three times a week, with fatty foods to aide absorption -Continue Faslodex and Xgeva every 4 weeks  -Will see the pt back in 10 days   CT chest/abd/pelvis ordered stat for tommorrow RTC with Dr KIrene Limboin 10 days Continue Faslodex and XDelton Seeq4weeks - plz schedule next 4 doses Plz schedule to start taxol in 10 days with labs   All of the patients questions were answered with apparent satisfaction. The patient knows to call the clinic with any problems, questions or concerns.   The total time spent in the appt was 30 minutes and more than 50% was on counseling and direct patient cares.    GSullivan LoneMD MStanwoodAAHIVMS SNorwalk Community HospitalCRipon Med CtrHematology/Oncology Physician CGuaynabo Ambulatory [facility] Inc (Office):       [phone](Work cell):  [phone](Fax):  [phone]  I, Baldwin Jamaica, am acting as a scribe for Dr. Sullivan Lone.   .I have reviewed the above documentation for accuracy and completeness, and I agree with the above. [name] MD

## 2019-03-14 ENCOUNTER — Other Ambulatory Visit: Payer: Self-pay | Admitting: *Deleted

## 2019-03-14 MED ORDER — CHLORHEXIDINE GLUCONATE 0.12 % MT SOLN: 15.0000 mL | Freq: Two times a day (BID) | OROMUCOSAL | 1 refills | Status: DC | PRN

## 2019-03-14 NOTE — Telephone Encounter (Signed)
Patient requested refill of Peridex mouthwash. Dr. Irene Limbo auth'd refill

## 2019-03-14 NOTE — Telephone Encounter (Signed)
Patient requested refill of Peridex. Dr. Irene Limbo authorized refill.

## 2019-03-16 NOTE — Addendum Note (Signed)
Encounter addended by: Erline Levine, MD on: 03/16/2019 10:16 AM  Actions taken: Clinical Note Signed

## 2019-03-16 NOTE — Op Note (Signed)
  Name: Stacey Cox  MRN: [id number]  Date: 02/20/2019   DOB: Nov 05, 1956  Stereotactic Radiosurgery Operative Note  PRE-OPERATIVE DIAGNOSIS:  Multiple Brain Metastases  POST-OPERATIVE DIAGNOSIS:  Multiple Brain Metastases  PROCEDURE:  Stereotactic Radiosurgery  SURGEON:  Peggyann Shoals, MD  NARRATIVE: The patient underwent a radiation treatment planning session in the radiation oncology simulation suite under the care of the radiation oncology physician and physicist.  I participated closely in the radiation treatment planning afterwards. The patient underwent planning CT which was fused to 3T high resolution MRI with 1 mm axial slices.  These images were fused on the planning system.  We contoured the gross target volumes and subsequently expanded this to yield the Planning Target Volume. I actively participated in the planning process.  I helped to define and review the target contours and also the contours of the optic pathway, eyes, brainstem and selected nearby organs at risk.  All the dose constraints for critical structures were reviewed and compared to AAPM Task Group 101.  The prescription dose conformity was reviewed.  I approved the plan electronically.    Accordingly, Stacey Cox was brought to the TrueBeam stereotactic radiation treatment linac and placed in the custom immobilization mask.  The patient was aligned according to the IR fiducial markers with BrainLab Exactrac, then orthogonal x-rays were used in ExacTrac with the 6DOF robotic table and the shifts were made to align the patient  Stacey Cox received stereotactic radiosurgery uneventfully.    Lesions treated: 2   Complex lesions treated:  1  (>3.5 cm, <70mm of optic path, or within the brainstem)   The detailed description of the procedure is recorded in the radiation oncology procedure note.  I was present for the duration of the procedure.  DISPOSITION:  Following delivery, the patient was transported to  nursing in stable condition and monitored for possible acute effects to be discharged to home in stable condition with follow-up in one month.  Peggyann Shoals, MD 03/16/2019 10:12 AM

## 2019-03-19 ENCOUNTER — Other Ambulatory Visit: Payer: Self-pay

## 2019-03-19 ENCOUNTER — Encounter: Payer: Self-pay | Admitting: Pharmacist

## 2019-03-19 ENCOUNTER — Inpatient Hospital Stay (HOSPITAL_BASED_OUTPATIENT_CLINIC_OR_DEPARTMENT_OTHER): Payer: BC Managed Care – PPO | Admitting: Hematology

## 2019-03-19 ENCOUNTER — Inpatient Hospital Stay: Payer: BC Managed Care – PPO | Attending: Hematology

## 2019-03-19 ENCOUNTER — Inpatient Hospital Stay: Payer: BC Managed Care – PPO

## 2019-03-19 VITALS — BP 115/85 | HR 85 | Temp 98.4°F | Resp 18 | Ht 65.0 in | Wt 170.7 lb

## 2019-03-19 DIAGNOSIS — G893 Neoplasm related pain (acute) (chronic): Secondary | ICD-10-CM | POA: Diagnosis not present

## 2019-03-19 DIAGNOSIS — C7951 Secondary malignant neoplasm of bone: Secondary | ICD-10-CM | POA: Insufficient documentation

## 2019-03-19 DIAGNOSIS — C50912 Malignant neoplasm of unspecified site of left female breast: Secondary | ICD-10-CM

## 2019-03-19 DIAGNOSIS — Z5111 Encounter for antineoplastic chemotherapy: Secondary | ICD-10-CM | POA: Insufficient documentation

## 2019-03-19 DIAGNOSIS — C7931 Secondary malignant neoplasm of brain: Secondary | ICD-10-CM | POA: Diagnosis not present

## 2019-03-19 DIAGNOSIS — C50919 Malignant neoplasm of unspecified site of unspecified female breast: Secondary | ICD-10-CM

## 2019-03-19 DIAGNOSIS — C773 Secondary and unspecified malignant neoplasm of axilla and upper limb lymph nodes: Secondary | ICD-10-CM | POA: Insufficient documentation

## 2019-03-19 DIAGNOSIS — C7800 Secondary malignant neoplasm of unspecified lung: Secondary | ICD-10-CM

## 2019-03-19 DIAGNOSIS — Z7189 Other specified counseling: Secondary | ICD-10-CM

## 2019-03-19 DIAGNOSIS — C787 Secondary malignant neoplasm of liver and intrahepatic bile duct: Secondary | ICD-10-CM | POA: Insufficient documentation

## 2019-03-19 DIAGNOSIS — E559 Vitamin D deficiency, unspecified: Secondary | ICD-10-CM

## 2019-03-19 DIAGNOSIS — Z95828 Presence of other vascular implants and grafts: Secondary | ICD-10-CM

## 2019-03-19 LAB — CMP (CANCER CENTER ONLY)
ALT: 133 U/L — ABNORMAL HIGH (ref 0–44)
AST: 76 U/L — ABNORMAL HIGH (ref 15–41)
Albumin: 3.1 g/dL — ABNORMAL LOW (ref 3.5–5.0)
Alkaline Phosphatase: 361 U/L — ABNORMAL HIGH (ref 38–126)
Anion gap: 11 (ref 5–15)
BUN: 18 mg/dL (ref 8–23)
CO2: 26 mmol/L (ref 22–32)
Calcium: 9.6 mg/dL (ref 8.9–10.3)
Chloride: 106 mmol/L (ref 98–111)
Creatinine: 0.79 mg/dL (ref 0.44–1.00)
GFR, Est AFR Am: >60 mL/min (ref >60)
GFR, Estimated: >60 mL/min (ref >60)
Glucose, Bld: 101 mg/dL — ABNORMAL HIGH (ref 70–99)
Potassium: 3.5 mmol/L (ref 3.5–5.1)
Sodium: 143 mmol/L (ref 135–145)
Total Bilirubin: 0.4 mg/dL (ref 0.3–1.2)
Total Protein: 6.8 g/dL (ref 6.5–8.1)

## 2019-03-19 LAB — CBC WITH DIFFERENTIAL/PLATELET
Abs Immature Granulocytes: 0.18 10*3/uL — ABNORMAL HIGH (ref 0.00–0.07)
Basophils Absolute: 0.1 10*3/uL (ref 0.0–0.1)
Basophils Relative: 1 %
Eosinophils Absolute: 0.1 10*3/uL (ref 0.0–0.5)
Eosinophils Relative: 1 %
HCT: 43.1 % (ref 36.0–46.0)
Hemoglobin: 14.2 g/dL (ref 12.0–15.0)
Immature Granulocytes: 2 %
Lymphocytes Relative: 15 %
Lymphs Abs: 1.2 10*3/uL (ref 0.7–4.0)
MCH: 28.3 pg (ref 26.0–34.0)
MCHC: 32.9 g/dL (ref 30.0–36.0)
MCV: 86 fL (ref 80.0–100.0)
Monocytes Absolute: 0.8 10*3/uL (ref 0.1–1.0)
Monocytes Relative: 9 %
Neutro Abs: 5.9 10*3/uL (ref 1.7–7.7)
Neutrophils Relative %: 72 %
Platelets: 149 10*3/uL — ABNORMAL LOW (ref 150–400)
RBC: 5.01 MIL/uL (ref 3.87–5.11)
RDW: 18 % — ABNORMAL HIGH (ref 11.5–15.5)
WBC: 8.1 10*3/uL (ref 4.0–10.5)
nRBC: 0 % (ref 0.0–0.2)

## 2019-03-19 MED ORDER — DENOSUMAB 120 MG/1.7ML ~~LOC~~ SOLN
120.0000 mg | Freq: Once | SUBCUTANEOUS | Status: AC
  Administered 2019-03-19: 11:00:00 120 mg via SUBCUTANEOUS

## 2019-03-19 MED ORDER — FULVESTRANT 250 MG/5ML IM SOLN
500.0000 mg | Freq: Once | INTRAMUSCULAR | Status: AC
  Administered 2019-03-19: 500 mg via INTRAMUSCULAR

## 2019-03-19 MED ORDER — DENOSUMAB 120 MG/1.7ML ~~LOC~~ SOLN
SUBCUTANEOUS | Status: AC
  Filled 2019-03-19: qty 1.7

## 2019-03-19 MED ORDER — FULVESTRANT 250 MG/5ML IM SOLN
INTRAMUSCULAR | Status: AC
  Filled 2019-03-19: qty 10

## 2019-03-19 NOTE — Progress Notes (Signed)
DISCONTINUE OFF PATHWAY REGIMEN - Breast   [id number]:Doxorubicin + Cyclophosphamide (AC):   A cycle is every 21 days:     Doxorubicin      Cyclophosphamide   **Always confirm dose/schedule in your pharmacy ordering system**  REASON: Disease Progression PRIOR TREATMENT: Off Pathway: Doxorubicin + Cyclophosphamide (AC) TREATMENT RESPONSE: Partial Response (PR)  START ON PATHWAY REGIMEN - Breast     A cycle is every 28 days (3 weeks on and 1 week off):     Paclitaxel   **Always confirm dose/schedule in your pharmacy ordering system**  Patient Characteristics: Distant Metastases or Locoregional Recurrent Disease - Unresected or Locally Advanced Unresectable Disease Progressing after Neoadjuvant and Local Therapies, HER2 Negative/Unknown/Equivocal, ER Positive, Chemotherapy, First Line Therapeutic Status: Distant Metastases BRCA Mutation Status: Did Not Order Test ER Status: Positive (+) HER2 Status: Negative (-) PR Status: Positive (+) Line of Therapy: First Line Intent of Therapy: Non-Curative / Palliative Intent, Discussed with Patient 

## 2019-03-19 NOTE — Patient Instructions (Signed)
Denosumab injection What is this medicine? DENOSUMAB (den oh sue mab) slows bone breakdown. Prolia is used to treat osteoporosis in women after menopause and in men, and in people who are taking corticosteroids for 6 months or more. Xgeva is used to treat a high calcium level due to cancer and to prevent bone fractures and other bone problems caused by multiple myeloma or cancer bone metastases. Xgeva is also used to treat giant cell tumor of the bone. This medicine may be used for other purposes; ask your health care provider or pharmacist if you have questions. COMMON BRAND NAME(S): Prolia, XGEVA What should I tell my health care provider before I take this medicine? They need to know if you have any of these conditions: -dental disease -having surgery or tooth extraction -infection -kidney disease -low levels of calcium or Vitamin D in the blood -malnutrition -on hemodialysis -skin conditions or sensitivity -thyroid or parathyroid disease -an unusual reaction to denosumab, other medicines, foods, dyes, or preservatives -pregnant or trying to get pregnant -breast-feeding How should I use this medicine? This medicine is for injection under the skin. It is given by a health care professional in a hospital or clinic setting. A special MedGuide will be given to you before each treatment. Be sure to read this information carefully each time. For Prolia, talk to your pediatrician regarding the use of this medicine in children. Special care may be needed. For Xgeva, talk to your pediatrician regarding the use of this medicine in children. While this drug may be prescribed for children as young as 13 years for selected conditions, precautions do apply. Overdosage: If you think you have taken too much of this medicine contact a poison control center or emergency room at once. NOTE: This medicine is only for you. Do not share this medicine with others. What if I miss a dose? It is important not to  miss your dose. Call your doctor or health care professional if you are unable to keep an appointment. What may interact with this medicine? Do not take this medicine with any of the following medications: -other medicines containing denosumab This medicine may also interact with the following medications: -medicines that lower your chance of fighting infection -steroid medicines like prednisone or cortisone This list may not describe all possible interactions. Give your health care provider a list of all the medicines, herbs, non-prescription drugs, or dietary supplements you use. Also tell them if you smoke, drink alcohol, or use illegal drugs. Some items may interact with your medicine. What should I watch for while using this medicine? Visit your doctor or health care professional for regular checks on your progress. Your doctor or health care professional may order blood tests and other tests to see how you are doing. Call your doctor or health care professional for advice if you get a fever, chills or sore throat, or other symptoms of a cold or flu. Do not treat yourself. This drug may decrease your body's ability to fight infection. Try to avoid being around people who are sick. You should make sure you get enough calcium and vitamin D while you are taking this medicine, unless your doctor tells you not to. Discuss the foods you eat and the vitamins you take with your health care professional. See your dentist regularly. Brush and floss your teeth as directed. Before you have any dental work done, tell your dentist you are receiving this medicine. Do not become pregnant while taking this medicine or for 5 months   after stopping it. Talk with your doctor or health care professional about your birth control options while taking this medicine. Women should inform their doctor if they wish to become pregnant or think they might be pregnant. There is a potential for serious side effects to an unborn  child. Talk to your health care professional or pharmacist for more information. What side effects may I notice from receiving this medicine? Side effects that you should report to your doctor or health care professional as soon as possible: -allergic reactions like skin rash, itching or hives, swelling of the face, lips, or tongue -bone pain -breathing problems -dizziness -jaw pain, especially after dental work -redness, blistering, peeling of the skin -signs and symptoms of infection like fever or chills; cough; sore throat; pain or trouble passing urine -signs of low calcium like fast heartbeat, muscle cramps or muscle pain; pain, tingling, numbness in the hands or feet; seizures -unusual bleeding or bruising -unusually weak or tired Side effects that usually do not require medical attention (report to your doctor or health care professional if they continue or are bothersome): -constipation -diarrhea -headache -joint pain -loss of appetite -muscle pain -runny nose -tiredness -upset stomach This list may not describe all possible side effects. Call your doctor for medical advice about side effects. You may report side effects to FDA at 1-800-FDA-1088. Where should I keep my medicine? This medicine is only given in a clinic, doctor's office, or other health care setting and will not be stored at home. NOTE: This sheet is a summary. It may not cover all possible information. If you have questions about this medicine, talk to your doctor, pharmacist, or health care provider.  2019 [employer] Standard (2018-03-17 16:10:44) Fulvestrant injection What is this medicine? FULVESTRANT (ful VES trant) blocks the effects of estrogen. It is used to treat breast cancer. This medicine may be used for other purposes; ask your health care provider or pharmacist if you have questions. COMMON BRAND NAME(S): FASLODEX What should I tell my health care provider before I take this medicine? They  need to know if you have any of these conditions: -bleeding disorders -liver disease -low blood counts, like low white cell, platelet, or red cell counts -an unusual or allergic reaction to fulvestrant, other medicines, foods, dyes, or preservatives -pregnant or trying to get pregnant -breast-feeding How should I use this medicine? This medicine is for injection into a muscle. It is usually given by a health care professional in a hospital or clinic setting. Talk to your pediatrician regarding the use of this medicine in children. Special care may be needed. Overdosage: If you think you have taken too much of this medicine contact a poison control center or emergency room at once. NOTE: This medicine is only for you. Do not share this medicine with others. What if I miss a dose? It is important not to miss your dose. Call your doctor or health care professional if you are unable to keep an appointment. What may interact with this medicine? -medicines that treat or prevent blood clots like warfarin, enoxaparin, dalteparin, apixaban, dabigatran, and rivaroxaban This list may not describe all possible interactions. Give your health care provider a list of all the medicines, herbs, non-prescription drugs, or dietary supplements you use. Also tell them if you smoke, drink alcohol, or use illegal drugs. Some items may interact with your medicine. What should I watch for while using this medicine? Your condition will be monitored carefully while you are receiving this medicine. You   will need important blood work done while you are taking this medicine. Do not become pregnant while taking this medicine or for at least 1 year after stopping it. Women of child-bearing potential will need to have a negative pregnancy test before starting this medicine. Women should inform their doctor if they wish to become pregnant or think they might be pregnant. There is a potential for serious side effects to an unborn  child. Men should inform their doctors if they wish to father a child. This medicine may lower sperm counts. Talk to your health care professional or pharmacist for more information. Do not breast-feed an infant while taking this medicine or for 1 year after the last dose. What side effects may I notice from receiving this medicine? Side effects that you should report to your doctor or health care professional as soon as possible: -allergic reactions like skin rash, itching or hives, swelling of the face, lips, or tongue -feeling faint or lightheaded, falls -pain, tingling, numbness, or weakness in the legs -signs and symptoms of infection like fever or chills; cough; flu-like symptoms; sore throat -vaginal bleeding Side effects that usually do not require medical attention (report to your doctor or health care professional if they continue or are bothersome): -aches, pains -constipation -diarrhea -headache -hot flashes -nausea, vomiting -pain at site where injected -stomach pain This list may not describe all possible side effects. Call your doctor for medical advice about side effects. You may report side effects to FDA at 1-800-FDA-1088. Where should I keep my medicine? This drug is given in a hospital or clinic and will not be stored at home. NOTE: This sheet is a summary. It may not cover all possible information. If you have questions about this medicine, talk to your doctor, pharmacist, or health care provider.  2019 [employer] Standard (2018-02-16 11:34:41)  

## 2019-03-20 ENCOUNTER — Telehealth: Payer: Self-pay | Admitting: Hematology

## 2019-03-20 ENCOUNTER — Other Ambulatory Visit: Payer: Self-pay | Admitting: Hematology

## 2019-03-20 ENCOUNTER — Encounter (HOSPITAL_COMMUNITY): Payer: Self-pay

## 2019-03-20 ENCOUNTER — Ambulatory Visit (HOSPITAL_COMMUNITY)
Admission: RE | Admit: 2019-03-20 | Discharge: 2019-03-20 | Disposition: A | Discharge status: Home / Self Care | Payer: BC Managed Care – PPO | Source: Ambulatory Visit | Attending: Hematology | Admitting: Hematology

## 2019-03-20 DIAGNOSIS — C787 Secondary malignant neoplasm of liver and intrahepatic bile duct: Secondary | ICD-10-CM

## 2019-03-20 DIAGNOSIS — C50919 Malignant neoplasm of unspecified site of unspecified female breast: Secondary | ICD-10-CM | POA: Insufficient documentation

## 2019-03-20 LAB — CANCER ANTIGEN 27.29: CA 27.29: 84.1 U/mL — ABNORMAL HIGH (ref 0.0–38.6)

## 2019-03-20 LAB — CANCER ANTIGEN 15-3: CA 15-3: 62.6 U/mL — ABNORMAL HIGH (ref 0.0–25.0)

## 2019-03-20 MED ORDER — IOHEXOL 300 MG/ML  SOLN
100.0000 mL | Freq: Once | INTRAMUSCULAR | Status: AC | PRN
  Administered 2019-03-20: 100 mL via INTRAVENOUS

## 2019-03-20 MED ORDER — SODIUM CHLORIDE (PF) 0.9 % IJ SOLN
INTRAMUSCULAR | Status: AC
  Filled 2019-03-20: qty 50

## 2019-03-20 NOTE — Telephone Encounter (Signed)
Scheduled appt per 4/27 los. °

## 2019-03-26 ENCOUNTER — Other Ambulatory Visit: Payer: Self-pay | Admitting: *Deleted

## 2019-03-26 ENCOUNTER — Telehealth: Payer: Self-pay | Admitting: *Deleted

## 2019-03-26 MED ORDER — LEVETIRACETAM 1000 MG PO TABS: 1000.0000 mg | ORAL_TABLET | Freq: Two times a day (BID) | ORAL | 5 refills | Status: AC

## 2019-03-26 NOTE — Telephone Encounter (Signed)
Patient called for Keppra refill.  Can she get more than 1 month refill?  Routed to MD

## 2019-03-28 NOTE — Progress Notes (Signed)
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  INTERVAL HISTORY   Stacey Cox is here for follow-up of her metastatic hormone positive HER-2 negative breast cancer. The patient's last visit with [place] was on 03/19/19. The pt reports that she is doing well overall.  The pt reports that she has been comfortable overall, but is a little tired. She notes that she has been able to get outside some. She denies any bone pains or new abdominal pains. She notes that she has been eating well and has gained some weight. She denies any seizure-like activity but notes that she has some mild headaches intermittently. She notes that she has used a compression sleeve on her left arm for the occasional swelling, but notes that his has not helped much. She denies any associated pain.  Of note since the patient's last visit, pt has had CT C/A/P completed on 03/20/19 with results revealing "Although the dominant liver lesion appears improved in the interval, there appears to be 2 new liver metastases on today's exam, concerning for disease progression. 2. Similar appearance of the widespread sclerotic bone metastases. 3. Interval decrease in ascites. 4. Aortic Atherosclerois."  Lab results today (03/29/19) of CBC w/diff and CMP is as follows: all values are WNL except for RDW at 18.6, PLT at 144k, Glucose at 147, Albumin at 3.0, AST at 75, ALT at 127, Alk Phos at 431.  On review of systems, pt reports eating well, some weight gain, feeling a little tired, and denies abdominal pains, bone pains, seizure like activity, and any other symptoms   MEDICAL HISTORY:  Past Medical History:  Diagnosis Date   Cancer ([place])    Metastatic Breast Cancer     SURGICAL HISTORY: Past Surgical History:  Procedure Laterality Date   CHOLECYSTECTOMY     IR IMAGING GUIDED PORT INSERTION  07/14/2018   TONSILLECTOMY      SOCIAL HISTORY: Social History   Socioeconomic History   Marital status: Single    Spouse name: Not on file   Number of children: Not on file   Years of education: Not on file   Highest education level: Not on file  Occupational History   Not on file  Social Needs   Financial resource strain: Not on file   Food insecurity:    Worry: Not on file    Inability: Not on file   Transportation needs:    Medical: Not on file    Non-medical: Not on file  Tobacco Use   Smoking status: Current Every Day Smoker    Packs/day: 0.15    Types: Cigarettes    Last attempt to quit: 01/21/2017    Years since quitting: 2.1   Smokeless tobacco: Never Used  Substance and Sexual Activity   Alcohol use: No    Alcohol/week: 0.0 standard drinks   Drug use: No   Sexual activity: Never  Lifestyle   Physical activity:    Days per week: Not on file    Minutes per session: Not on file   Stress: Not on file  Relationships   Social connections:    Talks on phone: Not on file    Gets together: Not on file    Attends  religious service: Not on file    Active member of club or organization: Not on file    Attends meetings of clubs or organizations: Not on file    Relationship status: Not on file   Intimate partner violence:    Fear of current or ex partner: No    Emotionally abused: No    Physically abused: No    Forced sexual activity: No  Other Topics Concern   Not on file  Social History Narrative   Lives alone. 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 Has phobia of the medical system.  Cigarette smoker 1/2 PPD for about 40 yrs Social alcohol use No drugs Professor at [employer] no children  FAMILY HISTORY: Family History  Problem Relation Age of Onset   Cancer Mother        breast   Cancer Sister         breast  Mother with h/o breast cancer in her 82's Sister with breast cancer in her 65ys and later was diagnosed with multiple myeloma. (Patient is not aware of any specific breast cancer mutations present)  ALLERGIES:  is allergic to thorazine [chlorpromazine] and ribociclib.  MEDICATIONS:  Current Outpatient Medications  Medication Sig Dispense Refill   aspirin EC 81 MG tablet Take 1 tablet (81 mg total) by mouth daily.     chlorhexidine (PERIDEX) 0.12 % solution Use as directed 15 mLs in the mouth or throat 2 (two) times daily as needed (mouth rinse after vomiting). Swish and spit 473 mL 1   dexamethasone (DECADRON) 1 MG tablet Take 1 tablet (1 mg total) by mouth daily. 30 tablet 2   escitalopram (LEXAPRO) 20 MG tablet TAKE 1 TABLET(20 MG) BY MOUTH DAILY 30 tablet 0   feeding supplement, ENSURE ENLIVE, (ENSURE ENLIVE) LIQD Take 237 mLs by mouth 2 (two) times daily between meals. (Patient taking differently: Take 237 mLs by mouth daily. ) 237 mL 12   hydrOXYzine (ATARAX/VISTARIL) 10 MG tablet Take 10 mg by mouth 2 (two) times daily.     levETIRAcetam (KEPPRA) 1000 MG tablet Take 1 tablet (1,000 mg total) by mouth 2 (two) times daily. 60 tablet 5   pantoprazole (PROTONIX) 40 MG tablet TAKE 1 TABLET(40 MG) BY MOUTH DAILY 30 tablet 1   Vitamin D, Ergocalciferol, (DRISDOL) 1.25 MG (50000 UT) CAPS capsule TAKE 1 CAPSULE BY MOUTH 3 TIMES WEEKLY 24 capsule 0   No current facility-administered medications for this visit.     REVIEW OF SYSTEMS:    A 10+ POINT REVIEW OF SYSTEMS WAS OBTAINED including neurology, dermatology, psychiatry, cardiac, respiratory, lymph, extremities, GI, GU, Musculoskeletal, constitutional, breasts, reproductive, HEENT.  All pertinent positives are noted in the HPI.  All others are negative.   PHYSICAL EXAMINATION:  ECOG PERFORMANCE STATUS: 1 - Symptomatic but completely ambulatory  Vitals:   03/29/19 1111  BP: 104/87  Pulse: 100  Resp: 18  Temp: 98.8 F  (37.1 C)  SpO2: 98%   Filed Weights   03/29/19 1111  Weight: 170 lb 12.8 oz (77.5 kg)   Body mass index is 28.42 kg/m.  GENERAL:alert, in no acute distress and comfortable SKIN: no acute rashes, no significant lesions EYES: conjunctiva are pink and non-injected, sclera anicteric OROPHARYNX: MMM, no exudates, no oropharyngeal erythema or ulceration NECK: supple, no JVD LYMPH:  no palpable lymphadenopathy in the cervical, axillary or inguinal regions LUNGS: clear to auscultation b/l with normal respiratory effort HEART: regular rate & rhythm ABDOMEN:  normoactive bowel sounds , non tender, not  distended. No palpable hepatosplenomegaly.  Extremity: no pedal edema PSYCH: alert & oriented x 3 with fluent speech NEURO: no focal motor/sensory deficits   LABORATORY DATA:  I have reviewed the data as listed  CBC Latest Ref Rng & Units 03/29/2019 03/19/2019 02/19/2019  WBC 4.0 - 10.5 K/uL 8.3 8.1 7.7  Hemoglobin 12.0 - 15.0 g/dL 14.1 14.2 14.3  Hematocrit 36.0 - 46.0 % 43.3 43.1 45.1  Platelets 150 - 400 K/uL 144(L) 149(L) 139(L)   CBC    Component Value Date/Time   WBC 8.3 03/29/2019 1056   WBC 8.1 03/19/2019 0927   RBC 4.88 03/29/2019 1056   HGB 14.1 03/29/2019 1056   HGB 13.9 11/24/2017 1340   HCT 43.3 03/29/2019 1056   HCT 42.0 11/24/2017 1340   PLT 144 (L) 03/29/2019 1056   PLT 212 11/24/2017 1340   MCV 88.7 03/29/2019 1056   MCV 97.0 11/24/2017 1340   MCH 28.9 03/29/2019 1056   MCHC 32.6 03/29/2019 1056   RDW 18.6 (H) 03/29/2019 1056   RDW 14.1 11/24/2017 1340   LYMPHSABS 1.2 03/29/2019 1056   LYMPHSABS 1.4 11/24/2017 1340   MONOABS 0.6 03/29/2019 1056   MONOABS 0.3 11/24/2017 1340   EOSABS 0.1 03/29/2019 1056   EOSABS 0.1 11/24/2017 1340   BASOSABS 0.0 03/29/2019 1056   BASOSABS 0.1 11/24/2017 1340    . CMP Latest Ref Rng & Units 03/29/2019 03/19/2019 02/19/2019  Glucose 70 - 99 mg/dL 147(H) 101(H) 160(H)  BUN 8 - 23 mg/dL 19 18 24(H)  Creatinine 0.44 - 1.00  mg/dL 0.81 0.79 0.80  Sodium 135 - 145 mmol/L 140 143 142  Potassium 3.5 - 5.1 mmol/L 3.9 3.5 3.7  Chloride 98 - 111 mmol/L 105 106 104  CO2 22 - 32 mmol/L 27 26 28   Calcium 8.9 - 10.3 mg/dL 9.5 9.6 9.5  Total Protein 6.5 - 8.1 g/dL 6.8 6.8 6.8  Total Bilirubin 0.3 - 1.2 mg/dL 0.6 0.4 0.5  Alkaline Phos 38 - 126 U/L 431(H) 361(H) 206(H)  AST 15 - 41 U/L 75(H) 76(H) 38  ALT 0 - 44 U/L 127(H) 133(H) 107(H)   07/18/18 Pathology:          RADIOGRAPHIC STUDIES: I have personally reviewed the radiological images as listed and agreed with the findings in the report. Ct Chest W Contrast  Result Date: 03/20/2019 CLINICAL DATA:  Metastatic breast cancer. EXAM: CT CHEST, ABDOMEN, AND PELVIS WITH CONTRAST TECHNIQUE: Multidetector CT imaging of the chest, abdomen and pelvis was performed following the standard protocol during bolus administration of intravenous contrast. CONTRAST:  130m OMNIPAQUE IOHEXOL 300 MG/ML  SOLN COMPARISON:  12/01/2018 FINDINGS: CT CHEST FINDINGS Cardiovascular: The heart size is normal. No substantial pericardial effusion. Right Port-A-Cath tip is positioned in the distal SVC. Mediastinum/Nodes: No mediastinal lymphadenopathy. There is no hilar lymphadenopathy. The esophagus has normal imaging features. There is no axillary lymphadenopathy. Lungs/Pleura: The central tracheobronchial airways are patent. 4 mm left lower lobe pulmonary nodule (53/6) is stable. Scattered minimal atelectasis noted right middle lobe and lingula. No pleural effusion. Musculoskeletal: Diffuse sclerotic bone disease compatible with metastatic involvement, similar to prior. CT ABDOMEN PELVIS FINDINGS Hepatobiliary: Multiple liver metastases again noted. Bandlike area of masslike differential perfusion measured previously at 9.5 x 4.0 cm is now 6.6 x 2.4 cm. While this particular lesion appears improved, there is a new 3.4 x 2.1 cm lesion in the posterior right liver (50/2). 2.6 cm lesion inferior right  liver (56/2) also appears new. Gallbladder is surgically  absent. No intrahepatic or extrahepatic biliary dilation. Pancreas: Diffusely atrophic. Spleen: Unremarkable. Small splenule just inferior to the spleen contains a cystic lesion, stable in the interval. Adrenals/Urinary Tract: No adrenal nodule or mass. Kidneys unremarkable. No evidence for hydroureter. The urinary bladder appears normal for the degree of distention. Stomach/Bowel: Stomach is unremarkable. No gastric wall thickening. No evidence of outlet obstruction. Duodenum is normally positioned as is the ligament of Treitz. No small bowel wall thickening. No small bowel dilatation. The terminal ileum is normal. Soft tissue attenuation posterior to the cecal tip likely ovarian given the course of the gonadal vein. No gross colonic mass. No colonic wall thickening. Diverticular changes are noted in the left colon without evidence of diverticulitis. Vascular/Lymphatic: There is abdominal aortic atherosclerosis without aneurysm. There is no gastrohepatic or hepatoduodenal ligament lymphadenopathy. No intraperitoneal or retroperitoneal lymphadenopathy. No pelvic sidewall lymphadenopathy. Reproductive: The uterus is unremarkable.  There is no adnexal mass. Other: Trace free fluid noted in the pelvis with small volume fluid adjacent to the liver. Musculoskeletal: Diffuse sclerotic bony metastases are similar to prior. Index lesion in the L4 vertebral body measures 18 mm today, unchanged since prior. IMPRESSION: 1. Although the dominant liver lesion appears improved in the interval, there appears to be 2 new liver metastases on today's exam, concerning for disease progression. 2. Similar appearance of the widespread sclerotic bone metastases. 3. Interval decrease in ascites. 4.  Aortic Atherosclerois (ICD10-170.0) Electronically Signed   By: Misty Stanley M.D.   On: 03/20/2019 10:37   Ct Abdomen Pelvis W Contrast  Result Date: 03/20/2019 CLINICAL DATA:   Metastatic breast cancer. EXAM: CT CHEST, ABDOMEN, AND PELVIS WITH CONTRAST TECHNIQUE: Multidetector CT imaging of the chest, abdomen and pelvis was performed following the standard protocol during bolus administration of intravenous contrast. CONTRAST:  136m OMNIPAQUE IOHEXOL 300 MG/ML  SOLN COMPARISON:  12/01/2018 FINDINGS: CT CHEST FINDINGS Cardiovascular: The heart size is normal. No substantial pericardial effusion. Right Port-A-Cath tip is positioned in the distal SVC. Mediastinum/Nodes: No mediastinal lymphadenopathy. There is no hilar lymphadenopathy. The esophagus has normal imaging features. There is no axillary lymphadenopathy. Lungs/Pleura: The central tracheobronchial airways are patent. 4 mm left lower lobe pulmonary nodule (53/6) is stable. Scattered minimal atelectasis noted right middle lobe and lingula. No pleural effusion. Musculoskeletal: Diffuse sclerotic bone disease compatible with metastatic involvement, similar to prior. CT ABDOMEN PELVIS FINDINGS Hepatobiliary: Multiple liver metastases again noted. Bandlike area of masslike differential perfusion measured previously at 9.5 x 4.0 cm is now 6.6 x 2.4 cm. While this particular lesion appears improved, there is a new 3.4 x 2.1 cm lesion in the posterior right liver (50/2). 2.6 cm lesion inferior right liver (56/2) also appears new. Gallbladder is surgically absent. No intrahepatic or extrahepatic biliary dilation. Pancreas: Diffusely atrophic. Spleen: Unremarkable. Small splenule just inferior to the spleen contains a cystic lesion, stable in the interval. Adrenals/Urinary Tract: No adrenal nodule or mass. Kidneys unremarkable. No evidence for hydroureter. The urinary bladder appears normal for the degree of distention. Stomach/Bowel: Stomach is unremarkable. No gastric wall thickening. No evidence of outlet obstruction. Duodenum is normally positioned as is the ligament of Treitz. No small bowel wall thickening. No small bowel dilatation.  The terminal ileum is normal. Soft tissue attenuation posterior to the cecal tip likely ovarian given the course of the gonadal vein. No gross colonic mass. No colonic wall thickening. Diverticular changes are noted in the left colon without evidence of diverticulitis. Vascular/Lymphatic: There is abdominal aortic atherosclerosis without aneurysm. There  is no gastrohepatic or hepatoduodenal ligament lymphadenopathy. No intraperitoneal or retroperitoneal lymphadenopathy. No pelvic sidewall lymphadenopathy. Reproductive: The uterus is unremarkable.  There is no adnexal mass. Other: Trace free fluid noted in the pelvis with small volume fluid adjacent to the liver. Musculoskeletal: Diffuse sclerotic bony metastases are similar to prior. Index lesion in the L4 vertebral body measures 18 mm today, unchanged since prior. IMPRESSION: 1. Although the dominant liver lesion appears improved in the interval, there appears to be 2 new liver metastases on today's exam, concerning for disease progression. 2. Similar appearance of the widespread sclerotic bone metastases. 3. Interval decrease in ascites. 4.  Aortic Atherosclerois (ICD10-170.0) Electronically Signed   By: Misty Stanley M.D.   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 Multifocal tumor in the left breast with biopsy-proven left axillary metastases.   Noted to have extensive bone metastases and pulmonary metastases. Patient was noted to have calvarial metastases but no overt parenchymal metastasis 09/14/17 CT Chest/ad/pelvis Results discussed in details - good response.  02/22/18 CT revealed  Newly apparent 2.1 by 2.0 cm rim enhancing lesion in the right hepatic lobe suspicious for a metastatic lesion. This was not apparent on prior exams although the prior exams were all noncontrast and thus the lesion may have been present  but with reduced conspicuity. There is also a small enhancing lesion further posteriorly in the right hepatic lobe which is technically nonspecific and could be a small hemangioma or a small metastatic lesion. Stable distribution and appearance of prior sclerotic osseous metastatic disease without bony progression. 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   07/06/18 CT C/A/P which revealed New pattern of heterogeneous enhancement and enhancing ill-defined lesions in the central LEFT and RIGHT hepatic lobe at site prior enhancing lesion is highly concerning for progression of of infiltrative malignancy in the liver occupying a large portion of the RIGHT hepatic lobe and central LEFT hepatic lobe. 2. Small amount of free fluid in the abdomen pelvis. 3. No metastatic adenopathy.4. No evidence pulmonary metastasis or lymphadenopathy. 5. Stable dense sclerotic skeletal metastasis   07/11/18 ECHO revealed LV EF of 55%-60%   07/18/18 Liver needle/core biopsy right lesion consi with Ki67 at 40%, ER/PR +ve   07/28/18 MRI Abdomen MRCP revealed Extensive hepatic metastatic disease, possibly progressive from recent abdominal CT. 2. No evidence of intrahepatic or extrahepatic biliary dilatation. Elevated liver function studies may be secondary to intrahepatic cholestasis. 3. Increased ascites without peritoneal nodularity or suspicious enhancement. 4. Grossly stable widespread osseous metastatic disease.   S/p 6 cycles of AC completed on 11/01/18  #2 Bone metastases due to breast cancer- on Xgeva. Much improved back pain.  # 3 Neutropenia Related to her Ribociclib - resolved. Patient is currently on Verzenio and has not developed any neutropenia. This is being monitored.  #4 Increased LFTs due to new liver metastases  #5 Cancer related pain - controlled - status post palliative RT to spine - no currently needing any pain medications.  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 No other new medication. Now resolved. Monitoring on increasing doses of Verzenio. No issues with recurrent rash on Verzenio 179m po BID  #7 Brain metastasis  07/20/18 CT Head revealed 1.8 x 2.4 x 3.9 cm LEFT frontal  cortical based mass with possible dural component highly concerning for metastatic disease, less likely abscess or cerebritis. Severe vasogenic edema with 5 mm LEFT to RIGHT subfalcine herniation. No ventricular entrapment. 2. 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 3. Multiple calvarial metastasis. 4. Cerebellar atrophy.    07/21/18 MRI Brain revealed  Irregular plaque-like mass, likely metastasis, measuring up to 5.1 cm, appears to originate from the left anterolateral frontal dura with invasion of the underlying frontal lobe of the brain. Mass effect from brain edema and the lesion partially effaces the frontal horn of left lateral ventricle and results in 7 mm left-to-right midline shift of anterior septum pellucidum. 2. Diffuse dural thickening over the left cerebral convexity may represent edema associated with the tumor or invasive neoplasm. 3. 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   07/28/18 MRI Brain revealed Extensive nodular dural enhancement left frontal lobe with associated enhancing mass growing in the left frontal lobe is unchanged in size and compatible with metastatic disease. This is associated with metastatic disease to the left frontal bone. Extensive edema in the left frontal lobe has progressed in the interval. Blastic metastatic disease throughout the calvarium. Metastatic disease in the cervical spine. Enhancing mass along the floor of the posterior fossa on the right is stable and most consistent with meningioma. 6 mm enhancing mass in the prepontine cistern on the right also unchanged and compatible with meningioma.    s/p SRS radiation therapy  10/23/18 MRI Brain revealed SRS protocol demonstrating treatment response: Regression of LEFT frontal dural metastasis, decreased vasogenic edema and parenchymal invasion. Similar calvarial metastasis. 2. Stable appearance of 2 small posterior fossa meningiomas. 3. No acute intracranial process.   02/02/19 MRI Brain revealed "Marked progression of dural disease left greater than right as described above. Pronounced brain edema, left more than right with left-to-right shift of 1 cm. 2. I do not identify any brain parenchymal metastases. 3. Small stable incidental meningiomas in the posterior fossa as above."  Pt is s/p SRS for dural metastases.  PLAN:  -Continue follow up with Dr. Mickeal Skinner in Neuro-oncology on 02/06/2019 and Dr. Tammi Klippel in [place] with Delton See every 4 weeks   #8 Vitamin D deficiency Vit D levels improved to 44.8  On 12/01/2018 -continue Vit D replacement.  #9 h/o Left upper extremity swelling- [place] neg for DVT - now swelling resolved.  PLAN:  -Discussed pt labwork today, 03/29/19; blood counts are stable. Transaminases stable. Alk Phos increased to 431. -03/19/19 CA27.29 and CA15-3 are showing progressive increase. -Discussed the 03/20/19 CT C/A/P which revealed "Although the dominant liver lesion appears improved in the interval, there appears to be 2 new liver metastases on today's exam, concerning for disease progression. 2. Similar appearance of the widespread sclerotic bone metastases. 3. Interval decrease in ascites. 4. Aortic Atherosclerois." -The pt has no prohibitive toxicities from beginning Taxol at this time.  -Will refer pt to outpatient lymphedema PT clinic for left arm lymphedema. -Patient to continue with Art Therapy and outpatient PT -Continue Keppra . -on tapering dexamethasone as per Dr Mickeal Skinner - currently on 9m daily -Continue 20MEQ Potassium PO BID replacement  -Continue Ergocalciferol 50k units, three times a week, with  fatty foods to aide absorption -Continue Faslodex and Xgeva every 4 weeks  -Will see the pt back in 1 week   F/u as per appointment in 1 week Rehab referral for left upper extremity lymphedema management  All of the patients questions were answered with apparent satisfaction. The patient knows to call the clinic with any problems, questions or concerns.   The total time spent in the appt was 25 minutes and more than 50% was on counseling and direct patient cares.    Sullivan Lone MD [facility] AAHIVMS [facility] Hematology/Oncology Physician [facility]  (Office):       [phone] (Work cell):  [phone] (Fax):           [phone]  I, Baldwin Jamaica, am acting as a scribe for Dr. Sullivan Lone.   .I have reviewed the above documentation for accuracy and completeness, and I agree with the above. [name] MD

## 2019-03-29 ENCOUNTER — Other Ambulatory Visit: Payer: Self-pay | Admitting: *Deleted

## 2019-03-29 ENCOUNTER — Inpatient Hospital Stay (HOSPITAL_BASED_OUTPATIENT_CLINIC_OR_DEPARTMENT_OTHER): Payer: BC Managed Care – PPO | Admitting: Hematology

## 2019-03-29 ENCOUNTER — Telehealth: Payer: Self-pay | Admitting: Hematology

## 2019-03-29 ENCOUNTER — Inpatient Hospital Stay: Payer: BC Managed Care – PPO

## 2019-03-29 ENCOUNTER — Inpatient Hospital Stay: Payer: BC Managed Care – PPO | Attending: Hematology

## 2019-03-29 ENCOUNTER — Other Ambulatory Visit: Payer: Self-pay

## 2019-03-29 VITALS — BP 109/89 | HR 100 | Temp 99.6°F | Resp 20

## 2019-03-29 VITALS — BP 104/87 | HR 100 | Temp 98.8°F | Resp 18 | Ht 65.0 in | Wt 170.8 lb

## 2019-03-29 DIAGNOSIS — C7800 Secondary malignant neoplasm of unspecified lung: Secondary | ICD-10-CM

## 2019-03-29 DIAGNOSIS — Z79899 Other long term (current) drug therapy: Secondary | ICD-10-CM | POA: Insufficient documentation

## 2019-03-29 DIAGNOSIS — Z9049 Acquired absence of other specified parts of digestive tract: Secondary | ICD-10-CM | POA: Insufficient documentation

## 2019-03-29 DIAGNOSIS — C7931 Secondary malignant neoplasm of brain: Secondary | ICD-10-CM

## 2019-03-29 DIAGNOSIS — R7989 Other specified abnormal findings of blood chemistry: Secondary | ICD-10-CM

## 2019-03-29 DIAGNOSIS — Z5111 Encounter for antineoplastic chemotherapy: Secondary | ICD-10-CM | POA: Diagnosis present

## 2019-03-29 DIAGNOSIS — C50919 Malignant neoplasm of unspecified site of unspecified female breast: Secondary | ICD-10-CM

## 2019-03-29 DIAGNOSIS — Z170 Estrogen receptor positive status [ER+]: Secondary | ICD-10-CM | POA: Diagnosis not present

## 2019-03-29 DIAGNOSIS — C7951 Secondary malignant neoplasm of bone: Secondary | ICD-10-CM | POA: Insufficient documentation

## 2019-03-29 DIAGNOSIS — R945 Abnormal results of liver function studies: Secondary | ICD-10-CM

## 2019-03-29 DIAGNOSIS — Z5189 Encounter for other specified aftercare: Secondary | ICD-10-CM | POA: Insufficient documentation

## 2019-03-29 DIAGNOSIS — G893 Neoplasm related pain (acute) (chronic): Secondary | ICD-10-CM

## 2019-03-29 DIAGNOSIS — I890 Lymphedema, not elsewhere classified: Secondary | ICD-10-CM

## 2019-03-29 DIAGNOSIS — F17210 Nicotine dependence, cigarettes, uncomplicated: Secondary | ICD-10-CM

## 2019-03-29 DIAGNOSIS — C787 Secondary malignant neoplasm of liver and intrahepatic bile duct: Secondary | ICD-10-CM

## 2019-03-29 DIAGNOSIS — Z803 Family history of malignant neoplasm of breast: Secondary | ICD-10-CM | POA: Diagnosis not present

## 2019-03-29 DIAGNOSIS — Z7982 Long term (current) use of aspirin: Secondary | ICD-10-CM | POA: Insufficient documentation

## 2019-03-29 DIAGNOSIS — C50812 Malignant neoplasm of overlapping sites of left female breast: Secondary | ICD-10-CM

## 2019-03-29 DIAGNOSIS — Z7189 Other specified counseling: Secondary | ICD-10-CM

## 2019-03-29 LAB — CMP (CANCER CENTER ONLY)
ALT: 127 U/L — ABNORMAL HIGH (ref 0–44)
AST: 75 U/L — ABNORMAL HIGH (ref 15–41)
Albumin: 3 g/dL — ABNORMAL LOW (ref 3.5–5.0)
Alkaline Phosphatase: 431 U/L — ABNORMAL HIGH (ref 38–126)
Anion gap: 8 (ref 5–15)
BUN: 19 mg/dL (ref 8–23)
CO2: 27 mmol/L (ref 22–32)
Calcium: 9.5 mg/dL (ref 8.9–10.3)
Chloride: 105 mmol/L (ref 98–111)
Creatinine: 0.81 mg/dL (ref 0.44–1.00)
GFR, Est AFR Am: >60 mL/min (ref >60)
GFR, Estimated: >60 mL/min (ref >60)
Glucose, Bld: 147 mg/dL — ABNORMAL HIGH (ref 70–99)
Potassium: 3.9 mmol/L (ref 3.5–5.1)
Sodium: 140 mmol/L (ref 135–145)
Total Bilirubin: 0.6 mg/dL (ref 0.3–1.2)
Total Protein: 6.8 g/dL (ref 6.5–8.1)

## 2019-03-29 LAB — CBC WITH DIFFERENTIAL (CANCER CENTER ONLY)
Abs Immature Granulocytes: 0.06 10*3/uL (ref 0.00–0.07)
Basophils Absolute: 0 10*3/uL (ref 0.0–0.1)
Basophils Relative: 0 %
Eosinophils Absolute: 0.1 10*3/uL (ref 0.0–0.5)
Eosinophils Relative: 1 %
HCT: 43.3 % (ref 36.0–46.0)
Hemoglobin: 14.1 g/dL (ref 12.0–15.0)
Immature Granulocytes: 1 %
Lymphocytes Relative: 15 %
Lymphs Abs: 1.2 10*3/uL (ref 0.7–4.0)
MCH: 28.9 pg (ref 26.0–34.0)
MCHC: 32.6 g/dL (ref 30.0–36.0)
MCV: 88.7 fL (ref 80.0–100.0)
Monocytes Absolute: 0.6 10*3/uL (ref 0.1–1.0)
Monocytes Relative: 7 %
Neutro Abs: 6.2 10*3/uL (ref 1.7–7.7)
Neutrophils Relative %: 76 %
Platelet Count: 144 10*3/uL — ABNORMAL LOW (ref 150–400)
RBC: 4.88 MIL/uL (ref 3.87–5.11)
RDW: 18.6 % — ABNORMAL HIGH (ref 11.5–15.5)
WBC Count: 8.3 10*3/uL (ref 4.0–10.5)
nRBC: 0 % (ref 0.0–0.2)

## 2019-03-29 MED ORDER — SODIUM CHLORIDE 0.9 % IV SOLN
Freq: Once | INTRAVENOUS | Status: AC
  Administered 2019-03-29: 13:00:00 via INTRAVENOUS
  Filled 2019-03-29: qty 250

## 2019-03-29 MED ORDER — PALONOSETRON HCL INJECTION 0.25 MG/5ML
0.2500 mg | Freq: Once | INTRAVENOUS | Status: AC
  Administered 2019-03-29: 14:00:00 0.25 mg via INTRAVENOUS

## 2019-03-29 MED ORDER — HEPARIN SOD (PORK) LOCK FLUSH 100 UNIT/ML IV SOLN
500.0000 [IU] | Freq: Once | INTRAVENOUS | Status: AC | PRN
  Administered 2019-03-29: 16:00:00 500 [IU]
  Filled 2019-03-29: qty 5

## 2019-03-29 MED ORDER — DEXAMETHASONE SODIUM PHOSPHATE 10 MG/ML IJ SOLN
INTRAMUSCULAR | Status: AC
  Filled 2019-03-29: qty 1

## 2019-03-29 MED ORDER — FAMOTIDINE IN NACL 20-0.9 MG/50ML-% IV SOLN
INTRAVENOUS | Status: AC
  Filled 2019-03-29: qty 50

## 2019-03-29 MED ORDER — PALONOSETRON HCL INJECTION 0.25 MG/5ML
INTRAVENOUS | Status: AC
  Filled 2019-03-29: qty 5

## 2019-03-29 MED ORDER — SODIUM CHLORIDE 0.9% FLUSH
10.0000 mL | INTRAVENOUS | Status: DC | PRN
  Administered 2019-03-29: 16:00:00 10 mL
  Filled 2019-03-29: qty 10

## 2019-03-29 MED ORDER — DEXAMETHASONE SODIUM PHOSPHATE 10 MG/ML IJ SOLN
10.0000 mg | Freq: Once | INTRAMUSCULAR | Status: AC
  Administered 2019-03-29: 14:00:00 10 mg via INTRAVENOUS

## 2019-03-29 MED ORDER — FAMOTIDINE IN NACL 20-0.9 MG/50ML-% IV SOLN
20.0000 mg | Freq: Once | INTRAVENOUS | Status: AC
  Administered 2019-03-29: 20 mg via INTRAVENOUS

## 2019-03-29 MED ORDER — SODIUM CHLORIDE 0.9 % IV SOLN: 10.0000 mg | Freq: Once | INTRAVENOUS | Status: DC

## 2019-03-29 MED ORDER — DIPHENHYDRAMINE HCL 50 MG/ML IJ SOLN
50.0000 mg | Freq: Once | INTRAMUSCULAR | Status: AC
  Administered 2019-03-29: 14:00:00 50 mg via INTRAVENOUS

## 2019-03-29 MED ORDER — DIPHENHYDRAMINE HCL 50 MG/ML IJ SOLN
INTRAMUSCULAR | Status: AC
  Filled 2019-03-29: qty 1

## 2019-03-29 MED ORDER — SODIUM CHLORIDE 0.9 % IV SOLN
80.0000 mg/m2 | Freq: Once | INTRAVENOUS | Status: AC
  Administered 2019-03-29: 15:00:00 150 mg via INTRAVENOUS
  Filled 2019-03-29: qty 25

## 2019-03-29 NOTE — Telephone Encounter (Signed)
Per 5/7 los, appt already scheduled.

## 2019-03-29 NOTE — Progress Notes (Signed)
Per Dr. Irene Limbo: Ok to treat today with elevated AST/ALT.

## 2019-03-29 NOTE — Patient Instructions (Signed)
[facility] Discharge Instructions for Patients Receiving Chemotherapy  Today you received the following chemotherapy agents Taxol.  To help prevent nausea and vomiting after your treatment, we encourage you to take your nausea medication as directed.  If you develop nausea and vomiting that is not controlled by your nausea medication, call the clinic.   BELOW ARE SYMPTOMS THAT SHOULD BE REPORTED IMMEDIATELY:  *FEVER GREATER THAN 100.5 F  *CHILLS WITH OR WITHOUT FEVER  NAUSEA AND VOMITING THAT IS NOT CONTROLLED WITH YOUR NAUSEA MEDICATION  *UNUSUAL SHORTNESS OF BREATH  *UNUSUAL BRUISING OR BLEEDING  TENDERNESS IN MOUTH AND THROAT WITH OR WITHOUT PRESENCE OF ULCERS  *URINARY PROBLEMS  *BOWEL PROBLEMS  UNUSUAL RASH Items with * indicate a potential emergency and should be followed up as soon as possible.  Feel free to call the clinic should you have any questions or concerns. The clinic phone number is [phone].  Please show the [facility] at check-in to the Emergency Department and triage nurse.  Paclitaxel injection What is this medicine? PACLITAXEL (PAK li TAX el) is a chemotherapy drug. It targets fast dividing cells, like cancer cells, and causes these cells to die. This medicine is used to treat ovarian cancer, breast cancer, lung cancer, Kaposi's sarcoma, and other cancers. This medicine may be used for other purposes; ask your health care provider or pharmacist if you have questions. COMMON BRAND NAME(S): Onxol, Taxol What should I tell my health care provider before I take this medicine? They need to know if you have any of these conditions: -history of irregular heartbeat -liver disease -low blood counts, like low white cell, platelet, or red cell counts -lung or breathing disease, like asthma -tingling of the fingers or toes, or other nerve disorder -an unusual or allergic reaction to paclitaxel, alcohol, polyoxyethylated castor oil,  other chemotherapy, other medicines, foods, dyes, or preservatives -pregnant or trying to get pregnant -breast-feeding How should I use this medicine? This drug is given as an infusion into a vein. It is administered in a hospital or clinic by a specially trained health care professional. Talk to your pediatrician regarding the use of this medicine in children. Special care may be needed. Overdosage: If you think you have taken too much of this medicine contact a poison control center or emergency room at once. NOTE: This medicine is only for you. Do not share this medicine with others. What if I miss a dose? It is important not to miss your dose. Call your doctor or health care professional if you are unable to keep an appointment. What may interact with this medicine? Do not take this medicine with any of the following medications: -disulfiram -metronidazole This medicine may also interact with the following medications: -antiviral medicines for hepatitis, HIV or AIDS -certain antibiotics like erythromycin and clarithromycin -certain medicines for fungal infections like ketoconazole and itraconazole -certain medicines for seizures like carbamazepine, phenobarbital, phenytoin -gemfibrozil -nefazodone -rifampin -St. John's wort This list may not describe all possible interactions. Give your health care provider a list of all the medicines, herbs, non-prescription drugs, or dietary supplements you use. Also tell them if you smoke, drink alcohol, or use illegal drugs. Some items may interact with your medicine. What should I watch for while using this medicine? Your condition will be monitored carefully while you are receiving this medicine. You will need important blood work done while you are taking this medicine. This medicine can cause serious allergic reactions. To reduce your risk you  will need to take other medicine(s) before treatment with this medicine. If you experience allergic  reactions like skin rash, itching or hives, swelling of the face, lips, or tongue, tell your doctor or health care professional right away. In some cases, you may be given additional medicines to help with side effects. Follow all directions for their use. This drug may make you feel generally unwell. This is not uncommon, as chemotherapy can affect healthy cells as well as cancer cells. Report any side effects. Continue your course of treatment even though you feel ill unless your doctor tells you to stop. Call your doctor or health care professional for advice if you get a fever, chills or sore throat, or other symptoms of a cold or flu. Do not treat yourself. This drug decreases your body's ability to fight infections. Try to avoid being around people who are sick. This medicine may increase your risk to bruise or bleed. Call your doctor or health care professional if you notice any unusual bleeding. Be careful brushing and flossing your teeth or using a toothpick because you may get an infection or bleed more easily. If you have any dental work done, tell your dentist you are receiving this medicine. Avoid taking products that contain aspirin, acetaminophen, ibuprofen, naproxen, or ketoprofen unless instructed by your doctor. These medicines may hide a fever. Do not become pregnant while taking this medicine. Women should inform their doctor if they wish to become pregnant or think they might be pregnant. There is a potential for serious side effects to an unborn child. Talk to your health care professional or pharmacist for more information. Do not breast-feed an infant while taking this medicine. Men are advised not to father a child while receiving this medicine. This product may contain alcohol. Ask your pharmacist or healthcare provider if this medicine contains alcohol. Be sure to tell all healthcare providers you are taking this medicine. Certain medicines, like metronidazole and disulfiram, can  cause an unpleasant reaction when taken with alcohol. The reaction includes flushing, headache, nausea, vomiting, sweating, and increased thirst. The reaction can last from 30 minutes to several hours. What side effects may I notice from receiving this medicine? Side effects that you should report to your doctor or health care professional as soon as possible: -allergic reactions like skin rash, itching or hives, swelling of the face, lips, or tongue -breathing problems -changes in vision -fast, irregular heartbeat -high or low blood pressure -mouth sores -pain, tingling, numbness in the hands or feet -signs of decreased platelets or bleeding - bruising, pinpoint red spots on the skin, black, tarry stools, blood in the urine -signs of decreased red blood cells - unusually weak or tired, feeling faint or lightheaded, falls -signs of infection - fever or chills, cough, sore throat, pain or difficulty passing urine -signs and symptoms of liver injury like dark yellow or brown urine; general ill feeling or flu-like symptoms; light-colored stools; loss of appetite; nausea; right upper belly pain; unusually weak or tired; yellowing of the eyes or skin -swelling of the ankles, feet, hands -unusually slow heartbeat Side effects that usually do not require medical attention (report to your doctor or health care professional if they continue or are bothersome): -diarrhea -hair loss -loss of appetite -muscle or joint pain -nausea, vomiting -pain, redness, or irritation at site where injected -tiredness This list may not describe all possible side effects. Call your doctor for medical advice about side effects. You may report side effects to FDA at  1-800-FDA-1088. Where should I keep my medicine? This drug is given in a hospital or clinic and will not be stored at home. NOTE: This sheet is a summary. It may not cover all possible information. If you have questions about this medicine, talk to your  doctor, pharmacist, or health care provider.  2019 [employer] (2017-07-12 13:14:55)

## 2019-03-29 NOTE — Progress Notes (Signed)
Per Dr. Irene Limbo ok to treat with ALT 127

## 2019-04-04 NOTE — Progress Notes (Signed)
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  INTERVAL HISTORY   Stacey Cox is here for follow-up of her metastatic hormone positive HER-2 negative breast cancer. The patient's last visit with [place] was on 03/29/19. The pt reports that she is doing well overall. She is accompanied today by her friend Layla, via [employer].  The pt reports that her first infusion of Taxol was uneventful last week. She notes that she felt a little sleepy the day after her infusion, but denies any other concerns. She notes that she has stable energy levels overall however. She notes that she is eating well and her weight is stable. She is continuing to consume Ensure. Her left arm continues to be swollen and she has an appointment with the lymphedema clinic on Tuesday 04/10/19. She elevates her arm while she sleeps and wears a compression sleeve a couple hours each day.  Lab results today (04/05/19) of CBC w/diff and CMP is as follows: all values are WNL except for RDW at 17.5, PLT at 143k, nRBC at 0.4%, Glucose at 213, Albumin at 2.9, AST at 70, ALT at 103, Alk Phos at 441.  On review of systems, pt reports eating well, stable weight, stable left arm swelling, stable energy levels, and denies abdominal pains, leg swelling, concerns for infections, and any other symptoms.    MEDICAL HISTORY:  Past Medical History:  Diagnosis Date   Cancer [facility])    Metastatic Breast Cancer    SURGICAL HISTORY: Past Surgical History:  Procedure Laterality Date   CHOLECYSTECTOMY     IR IMAGING GUIDED PORT INSERTION  07/14/2018   TONSILLECTOMY      SOCIAL HISTORY: Social History   Socioeconomic History   Marital status: Single    Spouse name:  Not on file   Number of children: Not on file   Years of education: Not on file   Highest education level: Not on file  Occupational History   Not on file  Social Needs   Financial resource strain: Not on file   Food insecurity:    Worry: Not on file    Inability: Not on file   Transportation needs:    Medical: Not on file    Non-medical: Not on file  Tobacco Use   Smoking status: Current Every Day Smoker    Packs/day: 0.15    Types: Cigarettes    Last attempt to quit: 01/21/2017    Years since quitting: 2.2   Smokeless tobacco: Never Used  Substance and Sexual Activity   Alcohol use: No    Alcohol/week: 0.0 standard drinks   Drug use: No   Sexual activity: Never  Lifestyle   Physical activity:    Days per week: Not on file    Minutes per session: Not on file   Stress: Not on file  Relationships   Social connections:    Talks on phone: Not on file    Gets together: Not on file    Attends religious service: Not on file    Active member of club or organization: Not on file    Attends meetings of clubs or organizations: Not on file    Relationship status: Not on file   Intimate  partner violence:    Fear of current or ex partner: No    Emotionally abused: No    Physically abused: No    Forced sexual activity: No  Other Topics Concern   Not on file  Social History Narrative   Lives alone. 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 Has phobia of the medical system.  Cigarette smoker 1/2 PPD for about 40 yrs Social alcohol use No drugs Professor at [employer] no children  FAMILY HISTORY: Family History  Problem Relation Age of Onset   Cancer Mother        breast   Cancer Sister        breast  Mother with h/o breast cancer in her 86's Sister with breast cancer in her 26ys and later was diagnosed with multiple myeloma. (Patient is not aware of any specific breast cancer mutations present)  ALLERGIES:  is allergic to thorazine [chlorpromazine] and  ribociclib.  MEDICATIONS:  Current Outpatient Medications  Medication Sig Dispense Refill   aspirin EC 81 MG tablet Take 1 tablet (81 mg total) by mouth daily.     chlorhexidine (PERIDEX) 0.12 % solution Use as directed 15 mLs in the mouth or throat 2 (two) times daily as needed (mouth rinse after vomiting). Swish and spit 473 mL 1   dexamethasone (DECADRON) 1 MG tablet Take 1 tablet (1 mg total) by mouth daily. 30 tablet 2   escitalopram (LEXAPRO) 20 MG tablet TAKE 1 TABLET(20 MG) BY MOUTH DAILY 30 tablet 0   feeding supplement, ENSURE ENLIVE, (ENSURE ENLIVE) LIQD Take 237 mLs by mouth 2 (two) times daily between meals. (Patient taking differently: Take 237 mLs by mouth daily. ) 237 mL 12   hydrOXYzine (ATARAX/VISTARIL) 10 MG tablet Take 10 mg by mouth 2 (two) times daily.     levETIRAcetam (KEPPRA) 1000 MG tablet Take 1 tablet (1,000 mg total) by mouth 2 (two) times daily. 60 tablet 5   pantoprazole (PROTONIX) 40 MG tablet TAKE 1 TABLET(40 MG) BY MOUTH DAILY 30 tablet 1   Vitamin D, Ergocalciferol, (DRISDOL) 1.25 MG (50000 UT) CAPS capsule TAKE 1 CAPSULE BY MOUTH 3 TIMES WEEKLY 24 capsule 0   No current facility-administered medications for this visit.     REVIEW OF SYSTEMS:    A 10+ POINT REVIEW OF SYSTEMS WAS OBTAINED including neurology, dermatology, psychiatry, cardiac, respiratory, lymph, extremities, GI, GU, Musculoskeletal, constitutional, breasts, reproductive, HEENT.  All pertinent positives are noted in the HPI.  All others are negative.   PHYSICAL EXAMINATION:  ECOG PERFORMANCE STATUS: 1 - Symptomatic but completely ambulatory  Vitals:   04/05/19 0839  BP: 104/81  Pulse: 73  Resp: 18  Temp: 97.8 F (36.6 C)  SpO2: 100%   Filed Weights   04/05/19 0839  Weight: 170 lb 6.4 oz (77.3 kg)   Body mass index is 28.36 kg/m.  GENERAL:alert, in no acute distress and comfortable SKIN: no acute rashes, no significant lesions EYES: conjunctiva are pink and  non-injected, sclera anicteric OROPHARYNX: MMM, no exudates, no oropharyngeal erythema or ulceration NECK: supple, no JVD LYMPH:  no palpable lymphadenopathy in the cervical, axillary or inguinal regions LUNGS: clear to auscultation b/l with normal respiratory effort HEART: regular rate & rhythm ABDOMEN:  normoactive bowel sounds , non tender, not distended. No palpable hepatosplenomegaly.  Extremity: no pedal edema PSYCH: alert & oriented x 3 with fluent speech NEURO: no focal motor/sensory deficits   LABORATORY DATA:  I have reviewed the data as listed  CBC Latest Ref Rng &  Units 04/05/2019 03/29/2019 03/19/2019  WBC 4.0 - 10.5 K/uL 4.6 8.3 8.1  Hemoglobin 12.0 - 15.0 g/dL 12.9 14.1 14.2  Hematocrit 36.0 - 46.0 % 39.5 43.3 43.1  Platelets 150 - 400 K/uL 143(L) 144(L) 149(L)   CBC    Component Value Date/Time   WBC 4.6 04/05/2019 0807   RBC 4.51 04/05/2019 0807   HGB 12.9 04/05/2019 0807   HGB 14.1 03/29/2019 1056   HGB 13.9 11/24/2017 1340   HCT 39.5 04/05/2019 0807   HCT 42.0 11/24/2017 1340   PLT 143 (L) 04/05/2019 0807   PLT 144 (L) 03/29/2019 1056   PLT 212 11/24/2017 1340   MCV 87.6 04/05/2019 0807   MCV 97.0 11/24/2017 1340   MCH 28.6 04/05/2019 0807   MCHC 32.7 04/05/2019 0807   RDW 17.5 (H) 04/05/2019 0807   RDW 14.1 11/24/2017 1340   LYMPHSABS 1.1 04/05/2019 0807   LYMPHSABS 1.4 11/24/2017 1340   MONOABS 0.3 04/05/2019 0807   MONOABS 0.3 11/24/2017 1340   EOSABS 0.1 04/05/2019 0807   EOSABS 0.1 11/24/2017 1340   BASOSABS 0.0 04/05/2019 0807   BASOSABS 0.1 11/24/2017 1340    . CMP Latest Ref Rng & Units 04/05/2019 03/29/2019 03/19/2019  Glucose 70 - 99 mg/dL 213(H) 147(H) 101(H)  BUN 8 - 23 mg/dL 17 19 18   Creatinine 0.44 - 1.00 mg/dL 0.82 0.81 0.79  Sodium 135 - 145 mmol/L 139 140 143  Potassium 3.5 - 5.1 mmol/L 3.8 3.9 3.5  Chloride 98 - 111 mmol/L 104 105 106  CO2 22 - 32 mmol/L 24 27 26   Calcium 8.9 - 10.3 mg/dL 9.1 9.5 9.6  Total Protein 6.5 - 8.1  g/dL 6.6 6.8 6.8  Total Bilirubin 0.3 - 1.2 mg/dL 0.4 0.6 0.4  Alkaline Phos 38 - 126 U/L 441(H) 431(H) 361(H)  AST 15 - 41 U/L 70(H) 75(H) 76(H)  ALT 0 - 44 U/L 103(H) 127(H) 133(H)   07/18/18 Pathology:          RADIOGRAPHIC STUDIES: I have personally reviewed the radiological images as listed and agreed with the findings in the report. Ct Chest W Contrast  Result Date: 03/20/2019 CLINICAL DATA:  Metastatic breast cancer. EXAM: CT CHEST, ABDOMEN, AND PELVIS WITH CONTRAST TECHNIQUE: Multidetector CT imaging of the chest, abdomen and pelvis was performed following the standard protocol during bolus administration of intravenous contrast. CONTRAST:  116m OMNIPAQUE IOHEXOL 300 MG/ML  SOLN COMPARISON:  12/01/2018 FINDINGS: CT CHEST FINDINGS Cardiovascular: The heart size is normal. No substantial pericardial effusion. Right Port-A-Cath tip is positioned in the distal SVC. Mediastinum/Nodes: No mediastinal lymphadenopathy. There is no hilar lymphadenopathy. The esophagus has normal imaging features. There is no axillary lymphadenopathy. Lungs/Pleura: The central tracheobronchial airways are patent. 4 mm left lower lobe pulmonary nodule (53/6) is stable. Scattered minimal atelectasis noted right middle lobe and lingula. No pleural effusion. Musculoskeletal: Diffuse sclerotic bone disease compatible with metastatic involvement, similar to prior. CT ABDOMEN PELVIS FINDINGS Hepatobiliary: Multiple liver metastases again noted. Bandlike area of masslike differential perfusion measured previously at 9.5 x 4.0 cm is now 6.6 x 2.4 cm. While this particular lesion appears improved, there is a new 3.4 x 2.1 cm lesion in the posterior right liver (50/2). 2.6 cm lesion inferior right liver (56/2) also appears new. Gallbladder is surgically absent. No intrahepatic or extrahepatic biliary dilation. Pancreas: Diffusely atrophic. Spleen: Unremarkable. Small splenule just inferior to the spleen contains a cystic  lesion, stable in the interval. Adrenals/Urinary Tract: No adrenal nodule or mass.  Kidneys unremarkable. No evidence for hydroureter. The urinary bladder appears normal for the degree of distention. Stomach/Bowel: Stomach is unremarkable. No gastric wall thickening. No evidence of outlet obstruction. Duodenum is normally positioned as is the ligament of Treitz. No small bowel wall thickening. No small bowel dilatation. The terminal ileum is normal. Soft tissue attenuation posterior to the cecal tip likely ovarian given the course of the gonadal vein. No gross colonic mass. No colonic wall thickening. Diverticular changes are noted in the left colon without evidence of diverticulitis. Vascular/Lymphatic: There is abdominal aortic atherosclerosis without aneurysm. There is no gastrohepatic or hepatoduodenal ligament lymphadenopathy. No intraperitoneal or retroperitoneal lymphadenopathy. No pelvic sidewall lymphadenopathy. Reproductive: The uterus is unremarkable.  There is no adnexal mass. Other: Trace free fluid noted in the pelvis with small volume fluid adjacent to the liver. Musculoskeletal: Diffuse sclerotic bony metastases are similar to prior. Index lesion in the L4 vertebral body measures 18 mm today, unchanged since prior. IMPRESSION: 1. Although the dominant liver lesion appears improved in the interval, there appears to be 2 new liver metastases on today's exam, concerning for disease progression. 2. Similar appearance of the widespread sclerotic bone metastases. 3. Interval decrease in ascites. 4.  Aortic Atherosclerois (ICD10-170.0) Electronically Signed   By: Misty Stanley M.D.   On: 03/20/2019 10:37   Ct Abdomen Pelvis W Contrast  Result Date: 03/20/2019 CLINICAL DATA:  Metastatic breast cancer. EXAM: CT CHEST, ABDOMEN, AND PELVIS WITH CONTRAST TECHNIQUE: Multidetector CT imaging of the chest, abdomen and pelvis was performed following the standard protocol during bolus administration of intravenous  contrast. CONTRAST:  167m OMNIPAQUE IOHEXOL 300 MG/ML  SOLN COMPARISON:  12/01/2018 FINDINGS: CT CHEST FINDINGS Cardiovascular: The heart size is normal. No substantial pericardial effusion. Right Port-A-Cath tip is positioned in the distal SVC. Mediastinum/Nodes: No mediastinal lymphadenopathy. There is no hilar lymphadenopathy. The esophagus has normal imaging features. There is no axillary lymphadenopathy. Lungs/Pleura: The central tracheobronchial airways are patent. 4 mm left lower lobe pulmonary nodule (53/6) is stable. Scattered minimal atelectasis noted right middle lobe and lingula. No pleural effusion. Musculoskeletal: Diffuse sclerotic bone disease compatible with metastatic involvement, similar to prior. CT ABDOMEN PELVIS FINDINGS Hepatobiliary: Multiple liver metastases again noted. Bandlike area of masslike differential perfusion measured previously at 9.5 x 4.0 cm is now 6.6 x 2.4 cm. While this particular lesion appears improved, there is a new 3.4 x 2.1 cm lesion in the posterior right liver (50/2). 2.6 cm lesion inferior right liver (56/2) also appears new. Gallbladder is surgically absent. No intrahepatic or extrahepatic biliary dilation. Pancreas: Diffusely atrophic. Spleen: Unremarkable. Small splenule just inferior to the spleen contains a cystic lesion, stable in the interval. Adrenals/Urinary Tract: No adrenal nodule or mass. Kidneys unremarkable. No evidence for hydroureter. The urinary bladder appears normal for the degree of distention. Stomach/Bowel: Stomach is unremarkable. No gastric wall thickening. No evidence of outlet obstruction. Duodenum is normally positioned as is the ligament of Treitz. No small bowel wall thickening. No small bowel dilatation. The terminal ileum is normal. Soft tissue attenuation posterior to the cecal tip likely ovarian given the course of the gonadal vein. No gross colonic mass. No colonic wall thickening. Diverticular changes are noted in the left colon  without evidence of diverticulitis. Vascular/Lymphatic: There is abdominal aortic atherosclerosis without aneurysm. There is no gastrohepatic or hepatoduodenal ligament lymphadenopathy. No intraperitoneal or retroperitoneal lymphadenopathy. No pelvic sidewall lymphadenopathy. Reproductive: The uterus is unremarkable.  There is no adnexal mass. Other: Trace free fluid noted in the  pelvis with small volume fluid adjacent to the liver. Musculoskeletal: Diffuse sclerotic bony metastases are similar to prior. Index lesion in the L4 vertebral body measures 18 mm today, unchanged since prior. IMPRESSION: 1. Although the dominant liver lesion appears improved in the interval, there appears to be 2 new liver metastases on today's exam, concerning for disease progression. 2. Similar appearance of the widespread sclerotic bone metastases. 3. Interval decrease in ascites. 4.  Aortic Atherosclerois (ICD10-170.0) Electronically Signed   By: Misty Stanley M.D.   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 Multifocal tumor in the left breast with biopsy-proven left axillary metastases.   Noted to have extensive bone metastases and pulmonary metastases. Patient was noted to have calvarial metastases but no overt parenchymal metastasis 09/14/17 CT Chest/ad/pelvis Results discussed in details - good response.  02/22/18 CT revealed  Newly apparent 2.1 by 2.0 cm rim enhancing lesion in the right hepatic lobe suspicious for a metastatic lesion. This was not apparent on prior exams although the prior exams were all noncontrast and thus the lesion may have been present but with reduced conspicuity. There is also a small enhancing lesion further posteriorly in the right hepatic lobe which is technically nonspecific and could be a small hemangioma or a small metastatic lesion. Stable distribution and  appearance of prior sclerotic osseous metastatic disease without bony progression. 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   07/06/18 CT C/A/P which revealed New pattern of heterogeneous enhancement and enhancing ill-defined lesions in the central LEFT and RIGHT hepatic lobe at site prior enhancing lesion is highly concerning for progression of of infiltrative malignancy in the liver occupying a large portion of the RIGHT hepatic lobe and central LEFT hepatic lobe. 2. Small amount of free fluid in the abdomen pelvis. 3. No metastatic adenopathy.4. No evidence pulmonary metastasis or lymphadenopathy. 5. Stable dense sclerotic skeletal metastasis   07/11/18 ECHO revealed LV EF of 55%-60%   07/18/18 Liver needle/core biopsy right lesion consi with Ki67 at 40%, ER/PR +ve   07/28/18 MRI Abdomen MRCP revealed Extensive hepatic metastatic disease, possibly progressive from recent abdominal CT. 2. No evidence of intrahepatic or extrahepatic biliary dilatation. Elevated liver function studies may be secondary to intrahepatic cholestasis. 3. Increased ascites without peritoneal nodularity or suspicious enhancement. 4. Grossly stable widespread osseous metastatic disease.   S/p 6 cycles of AC completed on 11/01/18  03/20/19 CT C/A/P revealed "Although the dominant liver lesion appears improved in the interval, there appears to be 2 new liver metastases on today's exam, concerning for disease progression. 2. Similar appearance of the widespread sclerotic bone metastases. 3. Interval decrease in ascites. 4. Aortic Atherosclerois."  #2 Bone metastases due to breast cancer- on Xgeva. Much improved back pain.  # 3 Neutropenia Related to her Ribociclib - resolved. Patient is currently on Verzenio and has not developed any neutropenia. This is being monitored.  #4 Increased LFTs due to new liver metastases  #5 Cancer related pain - controlled  - status post palliative RT to spine - no currently needing any pain medications.  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 No other new medication. Now resolved. Monitoring on increasing doses of Verzenio. No issues with recurrent rash on Verzenio 18m po BID  #7 Brain metastasis  07/20/18 CT Head revealed 1.8 x 2.4 x 3.9 cm LEFT frontal cortical based mass with possible dural component highly concerning for metastatic disease, less likely abscess or cerebritis. Severe vasogenic edema with 5 mm LEFT to RIGHT subfalcine herniation. No ventricular entrapment. 2. 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 3. Multiple calvarial metastasis. 4. Cerebellar atrophy.    07/21/18 MRI Brain revealed  Irregular plaque-like mass, likely metastasis, measuring up to 5.1 cm, appears to originate from the left anterolateral frontal dura with invasion of the underlying frontal lobe of the brain. Mass effect from brain edema and the lesion partially effaces the frontal horn of left lateral ventricle and results in 7 mm left-to-right midline shift of anterior septum pellucidum. 2. Diffuse dural thickening over the left cerebral convexity may represent edema associated with the tumor or invasive neoplasm. 3. 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   07/28/18 MRI Brain revealed Extensive nodular dural enhancement left frontal lobe with associated enhancing mass growing in the left frontal lobe is unchanged in size and compatible with metastatic disease. This is associated with metastatic disease to the left frontal bone. Extensive edema in the left frontal lobe has progressed in the interval. Blastic metastatic disease throughout the calvarium. Metastatic disease in the cervical spine. Enhancing mass along the floor of the posterior fossa on the right is stable and most consistent with meningioma. 6 mm enhancing mass  in the prepontine cistern on the right also unchanged and compatible with meningioma.   s/p SRS radiation therapy  10/23/18 MRI Brain revealed SRS protocol demonstrating treatment response: Regression of LEFT frontal dural metastasis, decreased vasogenic edema and parenchymal invasion. Similar calvarial metastasis. 2. Stable appearance of 2 small posterior fossa meningiomas. 3. No acute intracranial process.   02/02/19 MRI Brain revealed "Marked progression of dural disease left greater than right as described above. Pronounced brain edema, left more than right with left-to-right shift of 1 cm. 2. I do not identify any brain parenchymal metastases. 3. Small stable incidental meningiomas in the posterior fossa as above."  Pt is s/p SRS for dural metastases.  PLAN:  -Continue follow up with Dr. Mickeal Skinner in Neuro-oncology on 02/06/2019 and Dr. Tammi Klippel in [place] with Delton See every 4 weeks   #8 Vitamin D deficiency Vit D levels improved to 44.8  On 12/01/2018 -continue Vit D replacement.  #9 h/o Left upper extremity swelling- [place] neg for DVT - now swelling resolved.  PLAN:  -Discussed pt labwork today, 04/05/19; liver functions improved, blood counts stable -The pt has no prohibitive toxicities from continuing her second weekly Taxol infusion at this time. Will pursue weekly Taxol for the first 3-4 infusions, and if tolerance is displayed, will consider moving to once every 3 weeks..  -Establish care with utpatient lymphedema PT clinic for left arm lymphedema next week on 04/10/19. -Patient to continue with Art Therapy and outpatient PT -Continue Keppra -on tapering dexamethasone as per Dr Mickeal Skinner - currently on 15m daily -Continue 20MEQ Potassium PO BID replacement  -Continue Ergocalciferol 50k units, three times a week, with fatty foods to aide absorption -Continue Faslodex and Xgeva every 4 weeks  -Will see the pt back in 1 week   RTC as per appointments for next treatment in 1  week   All of the patients questions were answered with apparent satisfaction. The patient knows to call the clinic with any problems, questions or concerns.   The  total time spent in the appt was 25 minutes and more than 50% was on counseling and direct patient cares.    Sullivan Lone MD [place] AAHIVMS [facility] Hematology/Oncology Physician [facility]  (Office):       [phone] (Work cell):  [phone] (Fax):           [phone]  I, Baldwin Jamaica, am acting as a scribe for Dr. Sullivan Lone.   .I have reviewed the above documentation for accuracy and completeness, and I agree with the above. [name] MD

## 2019-04-05 ENCOUNTER — Other Ambulatory Visit: Payer: Self-pay

## 2019-04-05 ENCOUNTER — Inpatient Hospital Stay: Payer: BC Managed Care – PPO

## 2019-04-05 ENCOUNTER — Inpatient Hospital Stay (HOSPITAL_BASED_OUTPATIENT_CLINIC_OR_DEPARTMENT_OTHER): Payer: BC Managed Care – PPO | Admitting: Hematology

## 2019-04-05 ENCOUNTER — Telehealth: Payer: Self-pay | Admitting: Hematology

## 2019-04-05 VITALS — BP 104/81 | HR 73 | Temp 97.8°F | Resp 18 | Ht 65.0 in | Wt 170.4 lb

## 2019-04-05 DIAGNOSIS — C50919 Malignant neoplasm of unspecified site of unspecified female breast: Secondary | ICD-10-CM

## 2019-04-05 DIAGNOSIS — Z9049 Acquired absence of other specified parts of digestive tract: Secondary | ICD-10-CM

## 2019-04-05 DIAGNOSIS — C50812 Malignant neoplasm of overlapping sites of left female breast: Secondary | ICD-10-CM

## 2019-04-05 DIAGNOSIS — Z79899 Other long term (current) drug therapy: Secondary | ICD-10-CM

## 2019-04-05 DIAGNOSIS — C7800 Secondary malignant neoplasm of unspecified lung: Secondary | ICD-10-CM

## 2019-04-05 DIAGNOSIS — Z170 Estrogen receptor positive status [ER+]: Secondary | ICD-10-CM

## 2019-04-05 DIAGNOSIS — C7951 Secondary malignant neoplasm of bone: Secondary | ICD-10-CM

## 2019-04-05 DIAGNOSIS — Z5111 Encounter for antineoplastic chemotherapy: Secondary | ICD-10-CM

## 2019-04-05 DIAGNOSIS — C787 Secondary malignant neoplasm of liver and intrahepatic bile duct: Secondary | ICD-10-CM

## 2019-04-05 DIAGNOSIS — F17210 Nicotine dependence, cigarettes, uncomplicated: Secondary | ICD-10-CM

## 2019-04-05 DIAGNOSIS — Z7982 Long term (current) use of aspirin: Secondary | ICD-10-CM

## 2019-04-05 DIAGNOSIS — G893 Neoplasm related pain (acute) (chronic): Secondary | ICD-10-CM

## 2019-04-05 DIAGNOSIS — Z7189 Other specified counseling: Secondary | ICD-10-CM

## 2019-04-05 DIAGNOSIS — C7931 Secondary malignant neoplasm of brain: Secondary | ICD-10-CM

## 2019-04-05 DIAGNOSIS — Z803 Family history of malignant neoplasm of breast: Secondary | ICD-10-CM

## 2019-04-05 LAB — CMP (CANCER CENTER ONLY)
ALT: 103 U/L — ABNORMAL HIGH (ref 0–44)
AST: 70 U/L — ABNORMAL HIGH (ref 15–41)
Albumin: 2.9 g/dL — ABNORMAL LOW (ref 3.5–5.0)
Alkaline Phosphatase: 441 U/L — ABNORMAL HIGH (ref 38–126)
Anion gap: 11 (ref 5–15)
BUN: 17 mg/dL (ref 8–23)
CO2: 24 mmol/L (ref 22–32)
Calcium: 9.1 mg/dL (ref 8.9–10.3)
Chloride: 104 mmol/L (ref 98–111)
Creatinine: 0.82 mg/dL (ref 0.44–1.00)
GFR, Est AFR Am: >60 mL/min (ref >60)
GFR, Estimated: >60 mL/min (ref >60)
Glucose, Bld: 213 mg/dL — ABNORMAL HIGH (ref 70–99)
Potassium: 3.8 mmol/L (ref 3.5–5.1)
Sodium: 139 mmol/L (ref 135–145)
Total Bilirubin: 0.4 mg/dL (ref 0.3–1.2)
Total Protein: 6.6 g/dL (ref 6.5–8.1)

## 2019-04-05 LAB — CBC WITH DIFFERENTIAL/PLATELET
Abs Immature Granulocytes: 0.08 10*3/uL — ABNORMAL HIGH (ref 0.00–0.07)
Basophils Absolute: 0 10*3/uL (ref 0.0–0.1)
Basophils Relative: 1 %
Eosinophils Absolute: 0.1 10*3/uL (ref 0.0–0.5)
Eosinophils Relative: 2 %
HCT: 39.5 % (ref 36.0–46.0)
Hemoglobin: 12.9 g/dL (ref 12.0–15.0)
Immature Granulocytes: 2 %
Lymphocytes Relative: 23 %
Lymphs Abs: 1.1 10*3/uL (ref 0.7–4.0)
MCH: 28.6 pg (ref 26.0–34.0)
MCHC: 32.7 g/dL (ref 30.0–36.0)
MCV: 87.6 fL (ref 80.0–100.0)
Monocytes Absolute: 0.3 10*3/uL (ref 0.1–1.0)
Monocytes Relative: 6 %
Neutro Abs: 3.1 10*3/uL (ref 1.7–7.7)
Neutrophils Relative %: 66 %
Platelets: 143 10*3/uL — ABNORMAL LOW (ref 150–400)
RBC: 4.51 MIL/uL (ref 3.87–5.11)
RDW: 17.5 % — ABNORMAL HIGH (ref 11.5–15.5)
WBC: 4.6 10*3/uL (ref 4.0–10.5)
nRBC: 0.4 % — ABNORMAL HIGH (ref 0.0–0.2)

## 2019-04-05 MED ORDER — CHLORHEXIDINE GLUCONATE 0.12 % MT SOLN: 15.0000 mL | Freq: Two times a day (BID) | OROMUCOSAL | 1 refills | Status: DC | PRN

## 2019-04-05 MED ORDER — DIPHENHYDRAMINE HCL 50 MG/ML IJ SOLN
50.0000 mg | Freq: Once | INTRAMUSCULAR | Status: AC
  Administered 2019-04-05: 10:00:00 50 mg via INTRAVENOUS

## 2019-04-05 MED ORDER — DEXAMETHASONE SODIUM PHOSPHATE 10 MG/ML IJ SOLN
INTRAMUSCULAR | Status: AC
  Filled 2019-04-05: qty 1

## 2019-04-05 MED ORDER — SODIUM CHLORIDE 0.9 % IV SOLN: 10.0000 mg | Freq: Once | INTRAVENOUS | Status: DC

## 2019-04-05 MED ORDER — SODIUM CHLORIDE 0.9% FLUSH
10.0000 mL | INTRAVENOUS | Status: DC | PRN
  Administered 2019-04-05: 12:00:00 10 mL
  Filled 2019-04-05: qty 10

## 2019-04-05 MED ORDER — DEXAMETHASONE SODIUM PHOSPHATE 10 MG/ML IJ SOLN
10.0000 mg | Freq: Once | INTRAMUSCULAR | Status: AC
  Administered 2019-04-05: 10:00:00 10 mg via INTRAVENOUS

## 2019-04-05 MED ORDER — PALONOSETRON HCL INJECTION 0.25 MG/5ML
INTRAVENOUS | Status: AC
  Filled 2019-04-05: qty 5

## 2019-04-05 MED ORDER — SODIUM CHLORIDE 0.9 % IV SOLN
Freq: Once | INTRAVENOUS | Status: AC
  Administered 2019-04-05: 10:00:00 via INTRAVENOUS
  Filled 2019-04-05: qty 250

## 2019-04-05 MED ORDER — PALONOSETRON HCL INJECTION 0.25 MG/5ML
0.2500 mg | Freq: Once | INTRAVENOUS | Status: AC
  Administered 2019-04-05: 10:00:00 0.25 mg via INTRAVENOUS

## 2019-04-05 MED ORDER — FAMOTIDINE IN NACL 20-0.9 MG/50ML-% IV SOLN
INTRAVENOUS | Status: AC
  Filled 2019-04-05: qty 50

## 2019-04-05 MED ORDER — FAMOTIDINE IN NACL 20-0.9 MG/50ML-% IV SOLN
20.0000 mg | Freq: Once | INTRAVENOUS | Status: AC
  Administered 2019-04-05: 10:00:00 20 mg via INTRAVENOUS

## 2019-04-05 MED ORDER — DIPHENHYDRAMINE HCL 50 MG/ML IJ SOLN
INTRAMUSCULAR | Status: AC
  Filled 2019-04-05: qty 1

## 2019-04-05 MED ORDER — SODIUM CHLORIDE 0.9 % IV SOLN
80.0000 mg/m2 | Freq: Once | INTRAVENOUS | Status: AC
  Administered 2019-04-05: 11:00:00 150 mg via INTRAVENOUS
  Filled 2019-04-05: qty 25

## 2019-04-05 MED ORDER — HEPARIN SOD (PORK) LOCK FLUSH 100 UNIT/ML IV SOLN
500.0000 [IU] | Freq: Once | INTRAVENOUS | Status: AC | PRN
  Administered 2019-04-05: 12:00:00 500 [IU]
  Filled 2019-04-05: qty 5

## 2019-04-05 NOTE — Patient Instructions (Signed)
[facility] Discharge Instructions for Patients Receiving Chemotherapy  Today you received the following chemotherapy agents Paclitaxel (TAXOL).  To help prevent nausea and vomiting after your treatment, we encourage you to take your nausea medication as prescribed.  If you develop nausea and vomiting that is not controlled by your nausea medication, call the clinic.   BELOW ARE SYMPTOMS THAT SHOULD BE REPORTED IMMEDIATELY:  *FEVER GREATER THAN 100.5 F  *CHILLS WITH OR WITHOUT FEVER  NAUSEA AND VOMITING THAT IS NOT CONTROLLED WITH YOUR NAUSEA MEDICATION  *UNUSUAL SHORTNESS OF BREATH  *UNUSUAL BRUISING OR BLEEDING  TENDERNESS IN MOUTH AND THROAT WITH OR WITHOUT PRESENCE OF ULCERS  *URINARY PROBLEMS  *BOWEL PROBLEMS  UNUSUAL RASH Items with * indicate a potential emergency and should be followed up as soon as possible.  Feel free to call the clinic should you have any questions or concerns. The clinic phone number is [phone].  Please show the CHEMO ALERT CARD at check-in to the Emergency Department and triage nurse.  Coronavirus (COVID-19) Are you at risk?  Are you at risk for the Coronavirus (COVID-19)?  To be considered HIGH RISK for Coronavirus (COVID-19), you have to meet the following criteria:  . Traveled to [place], [place], [place], [place] or [place]; or in the [place] to [place], [place], [place], or [place]; and have fever, cough, and shortness of breath within the last 2 weeks of travel OR . Been in close contact with a person diagnosed with COVID-19 within the last 2 weeks and have fever, cough, and shortness of breath . IF YOU DO NOT MEET THESE CRITERIA, YOU ARE CONSIDERED LOW RISK FOR COVID-19.  What to do if you are HIGH RISK for COVID-19?  . If you are having a medical emergency, call 911. . Seek medical care right away. Before you go to a doctor's office, urgent care or emergency department, call ahead and tell them about  your recent travel, contact with someone diagnosed with COVID-19, and your symptoms. You should receive instructions from your physician's office regarding next steps of care.  . When you arrive at healthcare provider, tell the healthcare staff immediately you have returned from visiting [place], [place], [place], [place] or [place]; or traveled in the [place] to [place], [place], [place], or [place]; in the last two weeks or you have been in close contact with a person diagnosed with COVID-19 in the last 2 weeks.   . Tell the health care staff about your symptoms: fever, cough and shortness of breath. . After you have been seen by a medical provider, you will be either: o Tested for (COVID-19) and discharged home on quarantine except to seek medical care if symptoms worsen, and asked to  - Stay home and avoid contact with others until you get your results (4-5 days)  - Avoid travel on public transportation if possible (such as bus, train, or airplane) or o Sent to the Emergency Department by EMS for evaluation, COVID-19 testing, and possible admission depending on your condition and test results.  What to do if you are LOW RISK for COVID-19?  Reduce your risk of any infection by using the same precautions used for avoiding the common cold or flu:  . Wash your hands often with soap and warm water for at least 20 seconds.  If soap and water are not readily available, use an alcohol-based hand sanitizer with at least 60% alcohol.  . If coughing or sneezing,   cover your mouth and nose by coughing or sneezing into the elbow areas of your shirt or coat, into a tissue or into your sleeve (not your hands). . Avoid shaking hands with others and consider head nods or verbal greetings only. . Avoid touching your eyes, nose, or mouth with unwashed hands.  . Avoid close contact with people who are sick. . Avoid places or events with large numbers of people in one location, like concerts or sporting  events. . Carefully consider travel plans you have or are making. . If you are planning any travel outside or inside the [place], visit the CDC's Travelers' Health webpage for the latest health notices. . If you have some symptoms but not all symptoms, continue to monitor at home and seek medical attention if your symptoms worsen. . If you are having a medical emergency, call 911.   ADDITIONAL HEALTHCARE OPTIONS FOR PATIENTS  [facility] / e-Visit: [email or web address]         [facility]: [phone]  [facility]: [phone]                   [facility]: [phone]     

## 2019-04-05 NOTE — Progress Notes (Signed)
Per Dr.Kale - ok to treat with ALT of 103

## 2019-04-05 NOTE — Telephone Encounter (Signed)
Per 5/14 los, appt already scheduled.

## 2019-04-10 ENCOUNTER — Other Ambulatory Visit: Payer: Self-pay | Admitting: Hematology

## 2019-04-10 ENCOUNTER — Ambulatory Visit: Payer: BC Managed Care – PPO | Admitting: Physical Therapy

## 2019-04-10 ENCOUNTER — Other Ambulatory Visit: Payer: Self-pay

## 2019-04-10 ENCOUNTER — Telehealth: Payer: Self-pay | Admitting: *Deleted

## 2019-04-10 ENCOUNTER — Ambulatory Visit: Payer: BC Managed Care – PPO | Attending: Hematology | Admitting: Physical Therapy

## 2019-04-10 DIAGNOSIS — M545 Low back pain: Secondary | ICD-10-CM | POA: Insufficient documentation

## 2019-04-10 DIAGNOSIS — R29898 Other symptoms and signs involving the musculoskeletal system: Secondary | ICD-10-CM | POA: Diagnosis present

## 2019-04-10 DIAGNOSIS — R2689 Other abnormalities of gait and mobility: Secondary | ICD-10-CM | POA: Insufficient documentation

## 2019-04-10 DIAGNOSIS — I890 Lymphedema, not elsewhere classified: Secondary | ICD-10-CM | POA: Diagnosis not present

## 2019-04-10 NOTE — Therapy (Signed)
[place], [place], [address] Phone: [phone]   Fax:  [phone]  Physical Therapy Evaluation  Patient Details  Name: Stacey Cox MRN: [id number] Date of Birth: [birth year] Referring Provider (PT): Dr. Sullivan Lone   Encounter Date: 04/10/2019  PT End of Session - 04/10/19 1722    Visit Number  1    Number of Visits  13    Date for PT Re-Evaluation  05/11/19    PT Start Time  1600    PT Stop Time  1645    PT Time Calculation (min)  45 min    Activity Tolerance  Patient tolerated treatment well    Behavior During Therapy  [facility] for tasks assessed/performed       Past Medical History:  Diagnosis Date  . Cancer [facility])    Metastatic Breast Cancer    Past Surgical History:  Procedure Laterality Date  . CHOLECYSTECTOMY    . IR IMAGING GUIDED PORT INSERTION  07/14/2018  . TONSILLECTOMY      There were no vitals filed for this visit.   Subjective Assessment - 04/10/19 1621    Subjective  Pt says her arm is really swollen She has a CCL one compression sleeve over a year ago , but she can no longer get it on     Pertinent History  Back pain led to diagnosis of left breast cancer 01/20/17. 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 Multifocal tumor in the left breast with biopsy-proven left axillary metastases and metastases in bones and lung.   Has hd chemotherapy and radiation to her low back, 10 treatments  which helped with the pain. No other health issues. 04/10/19: She is currently undergoing chemotherapy with once a month infusions. she has completed brain radiation and has mets to liver and widespread sclerotic bone mets     Patient Stated Goals  to get lymphedema under control     Currently in Pain?  No/denies         [facility] PT Assessment - 04/10/19 1621      Assessment   Medical Diagnosis  left breast cancer with metastases    Referring Provider (PT)  Dr.  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 for age; bothered her bad knees a bit     Posture/Postural Control   Posture/Postural Control  Postural limitations    Postural Limitations  Rounded Shoulders;Forward head      AROM   Overall AROM   Within functional limits for tasks performed    Overall AROM Comments  --      Strength   Overall Strength  Within functional limits for tasks performed    Overall Strength Comments  pt with fatige after sitting unsupported for about 20 minutes and requests to lie down       Ambulation/Gait   Ambulation/Gait  Yes    Ambulation/Gait Assistance  7: Independent    Stairs  --      6 Minute Walk- Baseline   6 Minute Walk- Baseline  --    HR (bpm)  --    02 Sat (%RA)  --      6 Minute walk- Post Test   6 Minute Walk Post Test  --    HR (bpm)  --    02 Sat (%RA)  --    Modified  Borg Scale for Dyspnea  --    Perceived Rate of Exertion (Borg)  --      6 minute walk test results    Aerobic Endurance Distance Walked  --      High Level Balance   High Level Balance Comments  --        LYMPHEDEMA/ONCOLOGY QUESTIONNAIRE - 04/10/19 1627      Type   Cancer Type  metastatic breast cancer       Treatment   Active Chemotherapy Treatment  Yes    Past Chemotherapy Treatment  Yes      Right Upper Extremity Lymphedema   10 cm Proximal to Olecranon Process  27.5 cm    Olecranon Process  25 cm    15 cm Proximal to Ulnar Styloid Process  24.5 cm    10 cm Proximal to Ulnar Styloid Process  22 cm    Just Proximal to Ulnar Styloid Process  16 cm    Across Hand at [employer]  18.5 cm    At [place] of 2nd Digit  6.5 cm      Left Upper Extremity Lymphedema   10 cm Proximal to Olecranon Process  32 cm    Olecranon Process  30 cm    15 cm Proximal to Ulnar Styloid Process  29.5 cm    10 cm Proximal to Ulnar Styloid Process  27 cm    Just Proximal to Ulnar Styloid Process  19 cm    Across Hand at [employer]  19.7 cm    At [place] of 2nd Digit  7 cm             Objective measurements completed on examination: See above findings.      [facility] Adult PT Treatment/Exercise - 04/10/19 1621      Manual Therapy   Manual Therapy  Edema management;Compression Bandaging    Manual therapy comments  instructed in basics of complete decongestive therapy. Pt wants to proceed with bandaging with instruction for sister to do bandaging     Edema Management  educated in elevation and remedial exercise  of hand wrist and forearm Pt gave permission for demographics to be sent to [employer] for tribute wrap, flat knit sleeve and glove     Compression Bandaging  lotion applied to arm, then thick stockinette, elastomull to all 5 fingers, one artiflex, 1- 6, 1-8, 1-10 cm short stretch bandage              PT Education - 04/10/19 1721    Education provided  Yes     Education Details  elevation and exercise of left arm while in bandages     Person(s) Educated  Patient    Methods  Explanation;Demonstration    Comprehension  Verbalized understanding;Returned demonstration          PT Long Term Goals - 04/10/19 1737      PT LONG TERM GOAL #1   Title  Pt will haver reduction of circumference of left arm at 10 cm proximal to ulnar styloid by 3 cm     Time  4    Period  Weeks    Status  New      PT LONG TERM GOAL #2   Title  Pt will be independent in management of lymphedema at home with self manual lymph draiange and use of compression garments     Time  4    Period  Weeks    Status  New      PT LONG TERM GOAL #3   Title  Pt will be independent in conservative managment of lymphedema with elevation and exercise     Time  4    Period  Weeks    Status  New             Plan - 04/10/19 1723    Clinical Impression Statement  Pt is a 63 yo female with metastatic breast cancer with axillary lymph node involvment who presents with pitting lymphedema in her left arm. She wants to proceed with complete decongestive therapy including bandaging with goal to manage with self manual lymph drainage, elevation, remedial exercise, daytime flatknit sleeve and glove, and tribute wrap for night.  Faxed demographics to [employer] to get process of garment measurement started     Personal Factors and Comorbidities  Comorbidity 3+   lives alone    Comorbidities  metastatic cancer to bone, brain, liver,  axillary nodes     Stability/Clinical Decision Making  Evolving/Moderate complexity    Clinical Decision Making  Moderate    Clinical Presentation due to:  metastatic disease with progression and ongoing treatment     PT Frequency  3x / week    PT Duration  4 weeks    PT Treatment/Interventions  ADLs/Self Care Home Management;Therapeutic exercise;Therapeutic activities;Patient/family education;Orthotic Fit/Training;Manual techniques;Manual lymph  drainage;Compression bandaging;Passive range of motion;Taping;Vasopneumatic Device    PT Next Visit Plan  remeasure after bandages have been on.  Perform MLD and apply compression bandages teach sister if she is here.  Continue with teaching MLD and getting compression garments for management at home.     Consulted and Agree with Plan of Care  Patient       Patient will benefit from skilled therapeutic intervention in order to improve the following deficits and impairments:  Decreased knowledge of use of DME, Postural dysfunction, Impaired UE functional use, Increased edema  Visit Diagnosis: Lymphedema of left arm - Plan: PT plan of care cert/re-cert     Problem List Patient Active Problem List   Diagnosis Date Noted  . Palliative care  by specialist   . DNR (do not resuscitate)   . Expressive aphasia   . Brain metastasis ([place]) 01/27/2019  . Seizures ([place]) 07/21/2018  . Macrocytic anemia 07/21/2018  . Thrombocytopenia ([place]) 07/21/2018  . Mild renal insufficiency 07/21/2018  . Anxiety 07/21/2018  . Brain metastases ([place])   . Malignant neoplasm of female breast ([place])   . Breast cancer metastasized to brain ([place]) 07/20/2018  . Port-A-Cath in place 07/17/2018  . Liver metastases ([place]) 07/12/2018  . Genetic testing 08/08/2017  . Palliative care status   . Counseling regarding advance care planning and goals of care 03/25/2017  . 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 Nausea without vomiting 03/21/2017  . Bone metastases ([place]) 02/01/2017  . 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 Malignant neoplasm metastatic to lung ([place])   . Cancer associated pain   . Carcinoma of breast metastatic to brain ([place]) 01/21/2017  . Pathologic fracture   . Breast mass, left    Donato Heinz. Owens Shark PT  [place] Levo 04/10/2019, 5:41 PM  [place], [place], [address] Phone: [phone]   Fax:  [phone]  Name:  Steele Ledonne MRN: [id number] Date of Birth: 02-Apr-1956

## 2019-04-10 NOTE — Telephone Encounter (Signed)
Gum swollen on left lower side, right at gumline. No drainage. Using Magic mouthwash, but not helping. Informed her that Dr. Irene Limbo will receive information.

## 2019-04-11 ENCOUNTER — Other Ambulatory Visit: Payer: Self-pay

## 2019-04-11 ENCOUNTER — Other Ambulatory Visit: Payer: Self-pay | Admitting: Radiation Therapy

## 2019-04-11 ENCOUNTER — Ambulatory Visit
Admission: RE | Admit: 2019-04-11 | Discharge: 2019-04-11 | Disposition: A | Discharge status: Home / Self Care | Payer: BC Managed Care – PPO | Source: Ambulatory Visit | Attending: Urology | Admitting: Urology

## 2019-04-11 ENCOUNTER — Telehealth: Payer: Self-pay | Admitting: Radiation Therapy

## 2019-04-11 ENCOUNTER — Other Ambulatory Visit: Payer: Self-pay | Admitting: *Deleted

## 2019-04-11 ENCOUNTER — Ambulatory Visit: Payer: BC Managed Care – PPO

## 2019-04-11 DIAGNOSIS — C7931 Secondary malignant neoplasm of brain: Secondary | ICD-10-CM

## 2019-04-11 DIAGNOSIS — I890 Lymphedema, not elsewhere classified: Secondary | ICD-10-CM

## 2019-04-11 DIAGNOSIS — C7951 Secondary malignant neoplasm of bone: Secondary | ICD-10-CM

## 2019-04-11 DIAGNOSIS — C50919 Malignant neoplasm of unspecified site of unspecified female breast: Secondary | ICD-10-CM

## 2019-04-11 NOTE — Telephone Encounter (Signed)
Spoke with Stacey Cox about her upcoming brain MRI and visit with Stacey Cox in July. Her sister was with her and wrote down the information. Stacey Cox was thankful for the call and plans to attend these visits.   Mont Dutton R.T.(R)(T) Special Procedures Navigator

## 2019-04-11 NOTE — Therapy (Signed)
[place], [place], [address] Phone: [phone]   Fax:  [phone]  Physical Therapy Treatment  Patient Details  Name: Stacey Cox MRN: [id number] Date of Birth: Jun 19, 1956 Referring Provider (PT): Dr. Sullivan Lone   Encounter Date: 04/11/2019  PT End of Session - 04/11/19 1527    Visit Number  2    Number of Visits  13    Date for PT Re-Evaluation  05/11/19    PT Start Time  2633    PT Stop Time  1518    PT Time Calculation (min)  49 min    Activity Tolerance  Patient tolerated treatment well    Behavior During Therapy  [facility] for tasks assessed/performed       Past Medical History:  Diagnosis Date  . Cancer [facility])    Metastatic Breast Cancer    Past Surgical History:  Procedure Laterality Date  . CHOLECYSTECTOMY    . IR IMAGING GUIDED PORT INSERTION  07/14/2018  . TONSILLECTOMY      There were no vitals filed for this visit.  Subjective Assessment - 04/11/19 1436    Subjective  The bandage, was fine. It was of course annoying but it didn't hurt or bother me.     Pertinent History  Back pain led to diagnosis of left breast cancer 01/20/17. 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 Multifocal tumor in the left breast with biopsy-proven left axillary metastases and metastases in bones and lung.   Has hd chemotherapy and radiation to her low back, 10 treatments  which helped with the pain. No other health issues. 04/10/19: She is currently undergoing chemotherapy with once a month infusions. she has completed brain radiation and has mets to liver and widespread sclerotic bone mets     Patient Stated Goals  to get lymphedema under control     Currently in Pain?  No/denies                       [facility] Adult PT Treatment/Exercise - 04/11/19 0001      Manual Therapy   Manual Therapy  Manual Lymphatic Drainage (MLD);Compression Bandaging    Manual  Lymphatic Drainage (MLD)  In Supine: Short neck, 5 diaphragmatic breaths, Lt inguinal and Rt axillary nodes, Lt axillo-inguinal and anterior inter-axillary anastomosis, then Lt UE from lateral upper arm to dorsal hand working from proximal to distal then retracing all steps beginning to instruct pt throughout.     Compression Bandaging  lotion applied to arm, then thick stockinette, elastomull to fingers 1-4, one artiflex, 1- 6, 1-8, 1-10 cm short stretch bandages from hand to axilla             PT Education - 04/11/19 1525    Education provided  Yes    Education Details  Educated pt and sister who is staying with her and helping her for a few days in basics of anatmoy of lymphatic system and principles of manual lymph drainage. Sister recorded bandaging in case she needs to reapply this over weekend before she leaves.     Person(s) Educated  Patient    Methods  Explanation;Demonstration    Comprehension  Verbalized understanding;Need further instruction          PT Long Term Goals - 04/10/19 1737      PT LONG TERM GOAL #1   Title  Pt will haver reduction of circumference of left arm at 10 cm proximal to  ulnar styloid by 3 cm     Time  4    Period  Weeks    Status  New      PT LONG TERM GOAL #2   Title  Pt will be independent in management of lymphedema at home with self manual lymph draiange and use of compression garments     Time  4    Period  Weeks    Status  New      PT LONG TERM GOAL #3   Title  Pt will be independent in conservative managment of lymphedema with elevation and exercise     Time  4    Period  Weeks    Status  New            Plan - 04/11/19 1528    Clinical Impression Statement  Pt tolerated first session of bandaging done yesterday very well. No pain or tingling in extremity were verbalized. So progressed treatment today to include manual lymph drainage beginning to instruct pt in this. Also continued with compression bandaging as she tolerated  this well. Sister was also instructed in bandaging as pt has chemo tomorrow and if needs to take it off her sister can reapply.     Personal Factors and Comorbidities  Comorbidity 3+   lives alone   Comorbidities  metastatic cancer to bone, brain, liver,  axillary nodes     Stability/Clinical Decision Making  Evolving/Moderate complexity    PT Frequency  3x / week    PT Duration  4 weeks    PT Treatment/Interventions  ADLs/Self Care Home Management;Therapeutic exercise;Therapeutic activities;Patient/family education;Orthotic Fit/Training;Manual techniques;Manual lymph drainage;Compression bandaging;Passive range of motion;Taping;Vasopneumatic Device    PT Next Visit Plan  remeasure Friday to assess reduction thus far.  Cont MLD (and instruct pt in this, issue handout) and apply compression bandages, review with sister prn. Cont with process of getting compression garments for management at home.     Consulted and Agree with Plan of Care  Patient       Patient will benefit from skilled therapeutic intervention in order to improve the following deficits and impairments:  Decreased knowledge of use of DME, Postural dysfunction, Impaired UE functional use, Increased edema  Visit Diagnosis: Lymphedema of left arm     Problem List Patient Active Problem List   Diagnosis Date Noted  . Palliative care by specialist   . DNR (do not resuscitate)   . Expressive aphasia   . Brain metastasis ([place]) 01/27/2019  . Seizures ([place]) 07/21/2018  . Macrocytic anemia 07/21/2018  . Thrombocytopenia ([place]) 07/21/2018  . Mild renal insufficiency 07/21/2018  . Anxiety 07/21/2018  . Brain metastases ([place])   . Malignant neoplasm of female breast ([place])   . Breast cancer metastasized to brain ([place]) 07/20/2018  . Port-A-Cath in place 07/17/2018  . Liver metastases ([place]) 07/12/2018  . Genetic testing 08/08/2017  . Palliative care status   . Counseling regarding advance care planning and goals of care  03/25/2017  . 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 Nausea without vomiting 03/21/2017  . Bone metastases ([place]) 02/01/2017  . 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 Malignant neoplasm metastatic to lung ([place])   . Cancer associated pain   . Carcinoma of breast metastatic to brain ([place]) 01/21/2017  . Pathologic fracture   . Breast mass, left     Otelia Limes, PTA 04/11/2019, 3:33 PM  [place], [place], [address] Phone: [phone]  Fax:  [phone]  Name: Stacey Cox MRN: [id number] Date of Birth: 1956/06/10

## 2019-04-11 NOTE — Progress Notes (Signed)
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  INTERVAL HISTORY   Stacey Cox is here for follow-up of her metastatic hormone positive HER-2 negative breast cancer. The patient'Stacey last visit with [place] was on 04/05/19. The pt reports that she is doing well overall.  The pt reports that she was able to establish care with the lymphedema clinic for her left arm swelling, and notes that this has improved. She will be going to the clinic three times a week.  The pt notes that she is eating well, she feels kind of tired, but notes that she has been tolerating weekly Taxol well overall. She denies leg swelling.   She notes that she has had tenderness in her left lower jaw, endorses swelling, denies fevers, but endorses concern for an abscess in this area. The pt notes that she has intended to call her dentist about this but has not done this yet.  Lab results today (04/12/19) of CBC w/diff and CMP is as follows: all values are WNL except for WBC at 3.0k, RDW at 17.3, nRBC at 2.7%, ANC at 1.2k, Abs immature granulocytes at 0.21k, Potassium at 3.4, Glucose at 197, Total Protein at 6.4, Albumin at 2.9, AST at 44, ALT at 76, Alk Phos at 390. 04/12/19 Magnesium at 1.8  On review of systems, pt reports improved left arm swelling, eating well, stable appetite, stable weight, left gum tenderness and swelling, concern for infection, and denies leg swelling, tingling or numbness in hands or feet, mouth sores, leg swelling, fevers, chills, abdominal pains, and any other symptoms.    MEDICAL HISTORY:  Past Medical History:  Diagnosis Date   Cancer [facility])    Metastatic Breast Cancer    SURGICAL HISTORY: Past Surgical History:    Procedure Laterality Date   CHOLECYSTECTOMY     IR IMAGING GUIDED PORT INSERTION  07/14/2018   TONSILLECTOMY      SOCIAL HISTORY: Social History   Socioeconomic History   Marital status: Single    Spouse name: Not on file   Number of children: Not on file   Years of education: Not on file   Highest education level: Not on file  Occupational History   Not on file  Social Needs   Financial resource strain: Not on file   Food insecurity:    Worry: Not on file    Inability: Not on file   Transportation needs:    Medical: Not on file    Non-medical: Not on file  Tobacco Use   Smoking status: Current Every Day Smoker    Packs/day: 0.15    Types: Cigarettes    Last attempt to quit: 01/21/2017    Years since quitting: 2.2   Smokeless tobacco: Never Used  Substance and Sexual Activity   Alcohol use: No    Alcohol/week: 0.0 standard drinks   Drug use: No   Sexual activity: Never  Lifestyle   Physical activity:    Days per week: Not on file    Minutes per session: Not on file   Stress: Not on file  Relationships   Social connections:    Talks on phone: Not on file    Gets together: Not on file  Attends religious service: Not on file    Active member of club or organization: Not on file    Attends meetings of clubs or organizations: Not on file    Relationship status: Not on file   Intimate partner violence:    Fear of current or ex partner: No    Emotionally abused: No    Physically abused: No    Forced sexual activity: No  Other Topics Concern   Not on file  Social History Narrative   Lives alone. 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 Has phobia of the medical system.  Cigarette smoker 1/2 PPD for about 40 yrs Social alcohol use No drugs Professor at [employer] no children  FAMILY HISTORY: Family History  Problem Relation Age of Onset   Cancer Mother        breast   Cancer Sister        breast  Mother with h/o breast cancer in her  81'Stacey Sister with breast cancer in her 14ys and later was diagnosed with multiple myeloma. (Patient is not aware of any specific breast cancer mutations present)  ALLERGIES:  is allergic to thorazine [chlorpromazine] and ribociclib.  MEDICATIONS:  Current Outpatient Medications  Medication Sig Dispense Refill   amoxicillin-clavulanate (AUGMENTIN) 875-125 MG tablet Take 1 tablet by mouth 2 (two) times daily for 10 days. 20 tablet 0   aspirin EC 81 MG tablet Take 1 tablet (81 mg total) by mouth daily.     chlorhexidine (PERIDEX) 0.12 % solution Use as directed 15 mLs in the mouth or throat 2 (two) times daily as needed (mouth rinse after vomiting). Swish and spit 473 mL 1   dexamethasone (DECADRON) 1 MG tablet Take 1 tablet (1 mg total) by mouth daily. 30 tablet 2   escitalopram (LEXAPRO) 20 MG tablet TAKE 1 TABLET(20 MG) BY MOUTH DAILY 30 tablet 0   feeding supplement, ENSURE ENLIVE, (ENSURE ENLIVE) LIQD Take 237 mLs by mouth 2 (two) times daily between meals. (Patient taking differently: Take 237 mLs by mouth daily. ) 237 mL 12   hydrOXYzine (ATARAX/VISTARIL) 10 MG tablet Take 10 mg by mouth 2 (two) times daily.     levETIRAcetam (KEPPRA) 1000 MG tablet Take 1 tablet (1,000 mg total) by mouth 2 (two) times daily. 60 tablet 5   pantoprazole (PROTONIX) 40 MG tablet TAKE 1 TABLET(40 MG) BY MOUTH DAILY 30 tablet 1   Vitamin D, Ergocalciferol, (DRISDOL) 1.25 MG (50000 UT) CAPS capsule TAKE 1 CAPSULE BY MOUTH 3 TIMES WEEKLY 24 capsule 0   No current facility-administered medications for this visit.     REVIEW OF SYSTEMS:    A 10+ POINT REVIEW OF SYSTEMS WAS OBTAINED including neurology, dermatology, psychiatry, cardiac, respiratory, lymph, extremities, GI, GU, Musculoskeletal, constitutional, breasts, reproductive, HEENT.  All pertinent positives are noted in the HPI.  All others are negative.   PHYSICAL EXAMINATION:  ECOG PERFORMANCE STATUS: 1 - Symptomatic but completely  ambulatory  Vitals:   04/12/19 0943  BP: 100/64  Pulse: 100  Resp: 18  Temp: 97.9 F (36.6 C)  SpO2: 98%   Filed Weights   04/12/19 0943  Weight: 171 lb 3.2 oz (77.7 kg)   Body mass index is 28.49 kg/m.  GENERAL:alert, in no acute distress and comfortable SKIN: no acute rashes, no significant lesions EYES: conjunctiva are pink and non-injected, sclera anicteric OROPHARYNX: swelling over the left lower jaw, first molar, suggestive of abscess with tenderness and induration NECK: supple, no JVD LYMPH:  no palpable lymphadenopathy  in the cervical, axillary or inguinal regions LUNGS: clear to auscultation b/l with normal respiratory effort HEART: regular rate & rhythm ABDOMEN:  normoactive bowel sounds , non tender, not distended. No palpable hepatosplenomegaly.  Extremity: no pedal edema PSYCH: alert & oriented x 3 with fluent speech NEURO: no focal motor/sensory deficits   LABORATORY DATA:  I have reviewed the data as listed  CBC Latest Ref Rng & Units 04/12/2019 04/05/2019 03/29/2019  WBC 4.0 - 10.5 K/uL 3.0(L) 4.6 8.3  Hemoglobin 12.0 - 15.0 g/dL 12.7 12.9 14.1  Hematocrit 36.0 - 46.0 % 38.2 39.5 43.3  Platelets 150 - 400 K/uL 198 143(L) 144(L)   CBC    Component Value Date/Time   WBC 3.0 (L) 04/12/2019 0901   WBC 4.6 04/05/2019 0807   RBC 4.36 04/12/2019 0901   HGB 12.7 04/12/2019 0901   HGB 13.9 11/24/2017 1340   HCT 38.2 04/12/2019 0901   HCT 42.0 11/24/2017 1340   PLT 198 04/12/2019 0901   PLT 212 11/24/2017 1340   MCV 87.6 04/12/2019 0901   MCV 97.0 11/24/2017 1340   MCH 29.1 04/12/2019 0901   MCHC 33.2 04/12/2019 0901   RDW 17.3 (H) 04/12/2019 0901   RDW 14.1 11/24/2017 1340   LYMPHSABS 1.1 04/12/2019 0901   LYMPHSABS 1.4 11/24/2017 1340   MONOABS 0.4 04/12/2019 0901   MONOABS 0.3 11/24/2017 1340   EOSABS 0.1 04/12/2019 0901   EOSABS 0.1 11/24/2017 1340   BASOSABS 0.0 04/12/2019 0901   BASOSABS 0.1 11/24/2017 1340    . CMP Latest Ref Rng & Units  04/12/2019 04/05/2019 03/29/2019  Glucose 70 - 99 mg/dL 197(H) 213(H) 147(H)  BUN 8 - 23 mg/dL 12 17 19   Creatinine 0.44 - 1.00 mg/dL 0.82 0.82 0.81  Sodium 135 - 145 mmol/L 140 139 140  Potassium 3.5 - 5.1 mmol/L 3.4(L) 3.8 3.9  Chloride 98 - 111 mmol/L 103 104 105  CO2 22 - 32 mmol/L 26 24 27   Calcium 8.9 - 10.3 mg/dL 9.6 9.1 9.5  Total Protein 6.5 - 8.1 g/dL 6.4(L) 6.6 6.8  Total Bilirubin 0.3 - 1.2 mg/dL 0.4 0.4 0.6  Alkaline Phos 38 - 126 U/L 390(H) 441(H) 431(H)  AST 15 - 41 U/L 44(H) 70(H) 75(H)  ALT 0 - 44 U/L 76(H) 103(H) 127(H)   07/18/18 Pathology:          RADIOGRAPHIC STUDIES: I have personally reviewed the radiological images as listed and agreed with the findings in the report. Ct Chest W Contrast  Result Date: 03/20/2019 CLINICAL DATA:  Metastatic breast cancer. EXAM: CT CHEST, ABDOMEN, AND PELVIS WITH CONTRAST TECHNIQUE: Multidetector CT imaging of the chest, abdomen and pelvis was performed following the standard protocol during bolus administration of intravenous contrast. CONTRAST:  112m OMNIPAQUE IOHEXOL 300 MG/ML  SOLN COMPARISON:  12/01/2018 FINDINGS: CT CHEST FINDINGS Cardiovascular: The heart size is normal. No substantial pericardial effusion. Right Port-A-Cath tip is positioned in the distal SVC. Mediastinum/Nodes: No mediastinal lymphadenopathy. There is no hilar lymphadenopathy. The esophagus has normal imaging features. There is no axillary lymphadenopathy. Lungs/Pleura: The central tracheobronchial airways are patent. 4 mm left lower lobe pulmonary nodule (53/6) is stable. Scattered minimal atelectasis noted right middle lobe and lingula. No pleural effusion. Musculoskeletal: Diffuse sclerotic bone disease compatible with metastatic involvement, similar to prior. CT ABDOMEN PELVIS FINDINGS Hepatobiliary: Multiple liver metastases again noted. Bandlike area of masslike differential perfusion measured previously at 9.5 x 4.0 cm is now 6.6 x 2.4 cm. While this  particular lesion  appears improved, there is a new 3.4 x 2.1 cm lesion in the posterior right liver (50/2). 2.6 cm lesion inferior right liver (56/2) also appears new. Gallbladder is surgically absent. No intrahepatic or extrahepatic biliary dilation. Pancreas: Diffusely atrophic. Spleen: Unremarkable. Small splenule just inferior to the spleen contains a cystic lesion, stable in the interval. Adrenals/Urinary Tract: No adrenal nodule or mass. Kidneys unremarkable. No evidence for hydroureter. The urinary bladder appears normal for the degree of distention. Stomach/Bowel: Stomach is unremarkable. No gastric wall thickening. No evidence of outlet obstruction. Duodenum is normally positioned as is the ligament of Treitz. No small bowel wall thickening. No small bowel dilatation. The terminal ileum is normal. Soft tissue attenuation posterior to the cecal tip likely ovarian given the course of the gonadal vein. No gross colonic mass. No colonic wall thickening. Diverticular changes are noted in the left colon without evidence of diverticulitis. Vascular/Lymphatic: There is abdominal aortic atherosclerosis without aneurysm. There is no gastrohepatic or hepatoduodenal ligament lymphadenopathy. No intraperitoneal or retroperitoneal lymphadenopathy. No pelvic sidewall lymphadenopathy. Reproductive: The uterus is unremarkable.  There is no adnexal mass. Other: Trace free fluid noted in the pelvis with small volume fluid adjacent to the liver. Musculoskeletal: Diffuse sclerotic bony metastases are similar to prior. Index lesion in the L4 vertebral body measures 18 mm today, unchanged since prior. IMPRESSION: 1. Although the dominant liver lesion appears improved in the interval, there appears to be 2 new liver metastases on today'Stacey exam, concerning for disease progression. 2. Similar appearance of the widespread sclerotic bone metastases. 3. Interval decrease in ascites. 4.  Aortic Atherosclerois (ICD10-170.0) Electronically  Signed   By: Misty Stanley M.D.   On: 03/20/2019 10:37   Ct Abdomen Pelvis W Contrast  Result Date: 03/20/2019 CLINICAL DATA:  Metastatic breast cancer. EXAM: CT CHEST, ABDOMEN, AND PELVIS WITH CONTRAST TECHNIQUE: Multidetector CT imaging of the chest, abdomen and pelvis was performed following the standard protocol during bolus administration of intravenous contrast. CONTRAST:  150m OMNIPAQUE IOHEXOL 300 MG/ML  SOLN COMPARISON:  12/01/2018 FINDINGS: CT CHEST FINDINGS Cardiovascular: The heart size is normal. No substantial pericardial effusion. Right Port-A-Cath tip is positioned in the distal SVC. Mediastinum/Nodes: No mediastinal lymphadenopathy. There is no hilar lymphadenopathy. The esophagus has normal imaging features. There is no axillary lymphadenopathy. Lungs/Pleura: The central tracheobronchial airways are patent. 4 mm left lower lobe pulmonary nodule (53/6) is stable. Scattered minimal atelectasis noted right middle lobe and lingula. No pleural effusion. Musculoskeletal: Diffuse sclerotic bone disease compatible with metastatic involvement, similar to prior. CT ABDOMEN PELVIS FINDINGS Hepatobiliary: Multiple liver metastases again noted. Bandlike area of masslike differential perfusion measured previously at 9.5 x 4.0 cm is now 6.6 x 2.4 cm. While this particular lesion appears improved, there is a new 3.4 x 2.1 cm lesion in the posterior right liver (50/2). 2.6 cm lesion inferior right liver (56/2) also appears new. Gallbladder is surgically absent. No intrahepatic or extrahepatic biliary dilation. Pancreas: Diffusely atrophic. Spleen: Unremarkable. Small splenule just inferior to the spleen contains a cystic lesion, stable in the interval. Adrenals/Urinary Tract: No adrenal nodule or mass. Kidneys unremarkable. No evidence for hydroureter. The urinary bladder appears normal for the degree of distention. Stomach/Bowel: Stomach is unremarkable. No gastric wall thickening. No evidence of outlet  obstruction. Duodenum is normally positioned as is the ligament of Treitz. No small bowel wall thickening. No small bowel dilatation. The terminal ileum is normal. Soft tissue attenuation posterior to the cecal tip likely ovarian given the course of the gonadal  vein. No gross colonic mass. No colonic wall thickening. Diverticular changes are noted in the left colon without evidence of diverticulitis. Vascular/Lymphatic: There is abdominal aortic atherosclerosis without aneurysm. There is no gastrohepatic or hepatoduodenal ligament lymphadenopathy. No intraperitoneal or retroperitoneal lymphadenopathy. No pelvic sidewall lymphadenopathy. Reproductive: The uterus is unremarkable.  There is no adnexal mass. Other: Trace free fluid noted in the pelvis with small volume fluid adjacent to the liver. Musculoskeletal: Diffuse sclerotic bony metastases are similar to prior. Index lesion in the L4 vertebral body measures 18 mm today, unchanged since prior. IMPRESSION: 1. Although the dominant liver lesion appears improved in the interval, there appears to be 2 new liver metastases on today'Stacey exam, concerning for disease progression. 2. Similar appearance of the widespread sclerotic bone metastases. 3. Interval decrease in ascites. 4.  Aortic Atherosclerois (ICD10-170.0) Electronically Signed   By: Misty Stanley M.D.   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 Multifocal tumor in the left breast with biopsy-proven left axillary metastases.   Noted to have extensive bone metastases and pulmonary metastases. Patient was noted to have calvarial metastases but no overt parenchymal metastasis 09/14/17 CT Chest/ad/pelvis Results discussed in details - good response.  02/22/18 CT revealed  Newly apparent 2.1 by 2.0 cm rim enhancing lesion in the right hepatic lobe suspicious for a  metastatic lesion. This was not apparent on prior exams although the prior exams were all noncontrast and thus the lesion may have been present but with reduced conspicuity. There is also a small enhancing lesion further posteriorly in the right hepatic lobe which is technically nonspecific and could be a small hemangioma or a small metastatic lesion. Stable distribution and appearance of prior sclerotic osseous metastatic disease without bony progression. 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   07/06/18 CT C/A/P which revealed New pattern of heterogeneous enhancement and enhancing ill-defined lesions in the central LEFT and RIGHT hepatic lobe at site prior enhancing lesion is highly concerning for progression of of infiltrative malignancy in the liver occupying a large portion of the RIGHT hepatic lobe and central LEFT hepatic lobe. 2. Small amount of free fluid in the abdomen pelvis. 3. No metastatic adenopathy.4. No evidence pulmonary metastasis or lymphadenopathy. 5. Stable dense sclerotic skeletal metastasis   07/11/18 ECHO revealed LV EF of 55%-60%   07/18/18 Liver needle/core biopsy right lesion consi with Ki67 at 40%, ER/PR +ve   07/28/18 MRI Abdomen MRCP revealed Extensive hepatic metastatic disease, possibly progressive from recent abdominal CT. 2. No evidence of intrahepatic or extrahepatic biliary dilatation. Elevated liver function studies may be secondary to intrahepatic cholestasis. 3. Increased ascites without peritoneal nodularity or suspicious enhancement. 4. Grossly stable widespread osseous metastatic disease.   Stacey/p 6 cycles of AC completed on 11/01/18  03/20/19 CT C/A/P revealed "Although the dominant liver lesion appears improved in the interval, there appears to be 2 new liver metastases on today'Stacey exam, concerning for disease progression. 2. Similar appearance of the widespread sclerotic bone metastases. 3.  Interval decrease in ascites. 4. Aortic Atherosclerois."  #2 Bone metastases due to breast cancer- on Xgeva. Much improved back pain.  # 3 Neutropenia Related to her Ribociclib - resolved. Patient is currently on Verzenio and has not developed any neutropenia. This is being monitored.  #4 Increased  LFTs due to new liver metastases  #5 Cancer related pain - controlled - status post palliative RT to spine - no currently needing any pain medications.  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 No other new medication. Now resolved. Monitoring on increasing doses of Verzenio. No issues with recurrent rash on Verzenio 166m po BID  #7 Brain metastasis  07/20/18 CT Head revealed 1.8 x 2.4 x 3.9 cm LEFT frontal cortical based mass with possible dural component highly concerning for metastatic disease, less likely abscess or cerebritis. Severe vasogenic edema with 5 mm LEFT to RIGHT subfalcine herniation. No ventricular entrapment. 2. 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 3. Multiple calvarial metastasis. 4. Cerebellar atrophy.    07/21/18 MRI Brain revealed  Irregular plaque-like mass, likely metastasis, measuring up to 5.1 cm, appears to originate from the left anterolateral frontal dura with invasion of the underlying frontal lobe of the brain. Mass effect from brain edema and the lesion partially effaces the frontal horn of left lateral ventricle and results in 7 mm left-to-right midline shift of anterior septum pellucidum. 2. Diffuse dural thickening over the left cerebral convexity may represent edema associated with the tumor or invasive neoplasm. 3. 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   07/28/18 MRI Brain revealed Extensive nodular dural enhancement left frontal lobe with associated enhancing mass growing in the left frontal lobe is unchanged in size and compatible with metastatic  disease. This is associated with metastatic disease to the left frontal bone. Extensive edema in the left frontal lobe has progressed in the interval. Blastic metastatic disease throughout the calvarium. Metastatic disease in the cervical spine. Enhancing mass along the floor of the posterior fossa on the right is stable and most consistent with meningioma. 6 mm enhancing mass in the prepontine cistern on the right also unchanged and compatible with meningioma.   Stacey/p SRS radiation therapy  10/23/18 MRI Brain revealed SRS protocol demonstrating treatment response: Regression of LEFT frontal dural metastasis, decreased vasogenic edema and parenchymal invasion. Similar calvarial metastasis. 2. Stable appearance of 2 small posterior fossa meningiomas. 3. No acute intracranial process.   02/02/19 MRI Brain revealed "Marked progression of dural disease left greater than right as described above. Pronounced brain edema, left more than right with left-to-right shift of 1 cm. 2. I do not identify any brain parenchymal metastases. 3. Small stable incidental meningiomas in the posterior fossa as above."  Pt is Stacey/p SRS for dural metastases.  PLAN:  -Continue follow up with Dr. VMickeal Skinnerin Neuro-oncology on 02/06/2019 and Dr. MTammi Klippelin [place] Se[place] 4 weeks   #8 Vitamin D deficiency Vit D levels improved to 44.8  On 12/01/2018 -continue Vit D replacement.  #9 h/o Left upper extremity swelling- [place] for DVT - now swelling resolved.  PLAN:  -Discussed pt labwork today, 04/12/19; liver functions continue to improve with AST down to 44, ALT down to 76, and Alk Phos down to 390. ANC lower at 1.2k. Other blood counts and chemistries are stable. -Discussed that patient'Stacey new dental abscess is prohibitive from continuing her third weekly dose of Taxol today, originally planned for 3 weeks on and 1 week off, at this time. -Will plan to have her next infusion of Taxol in 2 weeks with C2D1, provided  infection clearance -Will provide Neulasta injection today -Ordering empiric antibiotic coverage with Augmentin -Pt will call her dentist this  afternoon -Continue care with outpatient lymphedema PT clinic for left arm lymphedema next week on 04/10/19. -Patient to continue with Art Therapy and outpatient PT -Continue Keppra -Continue tapering dexamethasone as per Dr Mickeal Skinner - currently on 31m daily -Continue 20MEQ Potassium PO BID replacement  -Continue Ergocalciferol 50k units, three times a week, with fatty foods to aide absorption -Continue Faslodex and Xgeva every 4 weeks  -Will see the pt back in 2 weeks   neulasta only today (no chemotherapy) Plz schedule C2 of chemotherapy as per orders starting 6/4 with labs and MD visit on D1 Neulasta onpro added D15  Patient to f/uw with her dentist immediately for dental abscess (patient will call for appointment)   All of the patients questions were answered with apparent satisfaction. The patient knows to call the clinic with any problems, questions or concerns.   The total time spent in the appt was 25 minutes and more than 50% was on counseling and direct patient cares.    GSullivan LoneMD MArbyrdAAHIVMS [facility]CCGi Specialists LLCHematology/Oncology Physician [facility] [place] (Office):       [phone](Work cell):  [phone](Fax):           [phone] I, SBaldwin Cox am acting as a scribe for Dr. GSullivan Cox   .I have reviewed the above documentation for accuracy and completeness, and I agree with the above. .Stacey GeneraMD

## 2019-04-11 NOTE — Telephone Encounter (Signed)
Late entry for 5/19: Attempted to contact patient w/Dr. Grier Mitts directions, left voice mail: Use salt & baking soda mouthwashes up to 4 times a day. If it does not resolve, or if fever or drainage develop, she will need to be evaluated by her dentist for possible abscess/periodontal disease. Encouraged her to contact office for further questions or concerns.

## 2019-04-11 NOTE — Progress Notes (Addendum)
Radiation Oncology         [phone] ________________________________  Name: Stacey Cox MRN: [id number]  Date: 04/11/2019  DOB: 07-21-1956  Post Treatment Note  CC: Patient, No Pcp Per  [name], MD  Diagnosis:   63 y.o. female with progressive dural metastasis secondary to metastatic breast cancer  Interval Since Last Radiation:  4 weeks  02/20/19, 02/23/19, 02/27/19, 03/02/19, 03/06/19:  PTV2-Dura treated to 25 Gy in 5 fractions  08/16/2018, 08/18/2018, 08/21/2018, 08/23/2018, 08/25/2018: Brain PTV1: Left anterior frontal lobe 74m // 25 Gy in 5 fractions, Max dose = 129.1%  02/07/2017-02/18/2017:  The L-Spine was treated to 30 Gy in 10 fractions at 3 Gy per fraction.  Narrative:  I spoke with the patient to conduct her routine scheduled 1 month follow up visit s/p completion of recent fractionated SRS treatement to the progressive dural metastases via telephone to spare the patient unnecessary potential exposure in the healthcare setting during the current COVID-19 pandemic.  The patient was notified in advance and gave permission to proceed with this visit format.  She tolerated radiation treatment well and there were no signs of acute toxicity after treatment.                              In summary, she is a 63y.o. 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 CT and MRI imaging at the time of admission/workup showed an ill-defined sclerotic lesion at L2 without pathologic fracture, mildly sclerotic as well as a mildly expansile lesion at LEFT L3 vertebral body with suspected pathologic fracture. She had an MRI of brain on 01/22/17 which revealed an 11 mm extra-axial mass in the posterior fossa favoring a small meningioma over metastasis and a 6 mm nodule was also discovered in the prepontine cistern,  possibly an additional small meningioma. A large occipital lobe calvarial lesion was noted but no parenchymal disease.    She established care with Dr. KIrene Limboon 02/01/17 and received Xgeva injections every 4 weeks in addition to [place] She received palliative radiation to her lumbar spine and sacrum from 02/07/2017 to 02/18/2017 and had dramatic improvement of her low back pain. She initially showed a good response to treatment until April 2019 CT showed a new enhancing lesion in the right hepatic lobe. CT C/A/P on 07/06/2018 showed no evidence of pulmonary metastasis or lymphadenopathy and stable dense sclerotic skeletal metastasis. There was a new pattern of heterogeneous enhancement and enhancing ill-defined lesions in the central left and right hepatic lobes. Prior enhancing lesion was highly concerning for progression of infiltrative malignancy in the liver occupying a large portion of the right hepatic lobe and central left hepatic lobe. No metastatic adenopathy in the abdomen/pelvis. Stable skeletal sclerotic metastasis. Her systemic therapy was changed to doxorubicin and cyclophosphamide every 3 weeks, beginning on 07/13/2018.  Verzenio and Femara were discontinued. A bipsy of the right lesion of the liver on 07/18/2018 revealed metastatic carcinoma, consistent with primary ER/PR positive breast carcinoma.  She presented to the emergency department on 07/20/2018 with two witnessed seizure episodes. She denied any neurologic symptoms prior to the seizures, and after a day in the hospital on Keppra was back to her baseline. Brain MRI at the time of admission demonstrated a plaque-like irregular enhancing mass with broad base  to the dura measuring 5.1 x 2.2 x 2.4 cm, likely originating from the dura with invasion of the underlying left frontal lobe with diffuse, smooth dural thickening over the left cerebral convexity which may represent associated edema and/or invasive neoplasm. Mass effect from  the lesion and brain edema partially effaces the frontal horn of left lateral ventricle resulting in 7 mm of left-to-right midline shift of the anterior septum pellucidum but without herniation. She was started on Decadron 4 mg daily. Repeat MRI of the brain on 07/28/2018 showed extensive nodular dural enhancement in the left frontal lobe with associated enhancing mass growing in the left frontal lobe, unchanged in size and compatible with metastatic disease associated with metastatic disease to the left frontal bone. Extensive edema in the left frontal lobe had progressed. There was blastic metastatic disease throughout the calvarium, as well as metastatic disease in the cervical spine. Enhancing mass along the floor of the posterior fossa on the right appeared stable and most consistent with meningioma. 6 mm enhancing mass in the prepontine cistern on the right was also unchanged and compatible with meningioma.  She elected to proceed with fractionated SRS treatment to the left frontal mass which was completed on 08/25/18 and tolerated very well.  She continued on systemic therapy with Doxorubicin and Cyclophosphamide under the care and direction of Dr. Irene Limbo- recently completed 6 cycles on 11/01/18 in addition to receiving Faslodex and Xgeva monthly.   She presented back to the ED on 01/27/19 with breakthrough seizure activity despite continuing on Keppra 518m po BID.  Her dose of Keppra was increased to 10016mpo BID and she was started on Decadron with improvement in her symptoms.  She was discharged home on increased dose of Keppra in addition to Decadron 15m43maily and did not have further seizure activity. CT head on admission was concerning for worsening intracranial metastasis with associated edema but a follow up MRI brain scan on 02/02/19 confirmed progressive dural metastases with pronounced edema but without evidence for any parenchymal brain metastases and stability of small meningiomas in the posterior  fossa. This scan was reviewed at multidisciplinary brain tumor board and recommendation was to proceed with a 5 fraction course of SRS to the progressive dural disease and she was in agreement.  This was completed on 03/06/19 and she tolerated treatment very well. She is currently tapering off the Decadron at the direction of Dr. VasMickeal Skinnerd is taking 1 mg daily and hoping to come off it all together very soon.  She has continued taking Keppra 1000 mg po BID and has not had further seizure activity. She has recently resumed Taxol systemic therapy due to evidence of disease progression in the liver on recent systemic imaging from 03/20/19.  She is tolerating the Taxol well and continues in close follow up with Dr. KalIrene LimboOn review of systems, the patient states that she is doing very well overall.  She denies headaches, dizziness, imbalance, difficulty with speech or further seizure activity.  She has mild residual fatigue but reports that this is manageable and she is able to remain fairly active.  She has continued taking Keppra 1000 mg p.o. twice daily and currently taking dexamethasone 1 mg daily.  ALLERGIES:  is allergic to thorazine [chlorpromazine] and ribociclib.  Meds: Current Outpatient Medications  Medication Sig Dispense Refill   aspirin EC 81 MG tablet Take 1 tablet (81 mg total) by mouth daily.     chlorhexidine (PERIDEX) 0.12 % solution Use as directed 15  mLs in the mouth or throat 2 (two) times daily as needed (mouth rinse after vomiting). Swish and spit 473 mL 1   dexamethasone (DECADRON) 1 MG tablet Take 1 tablet (1 mg total) by mouth daily. 30 tablet 2   escitalopram (LEXAPRO) 20 MG tablet TAKE 1 TABLET(20 MG) BY MOUTH DAILY 30 tablet 0   feeding supplement, ENSURE ENLIVE, (ENSURE ENLIVE) LIQD Take 237 mLs by mouth 2 (two) times daily between meals. (Patient taking differently: Take 237 mLs by mouth daily. ) 237 mL 12   hydrOXYzine (ATARAX/VISTARIL) 10 MG tablet Take 10 mg by mouth  2 (two) times daily.     levETIRAcetam (KEPPRA) 1000 MG tablet Take 1 tablet (1,000 mg total) by mouth 2 (two) times daily. 60 tablet 5   pantoprazole (PROTONIX) 40 MG tablet TAKE 1 TABLET(40 MG) BY MOUTH DAILY 30 tablet 1   Vitamin D, Ergocalciferol, (DRISDOL) 1.25 MG (50000 UT) CAPS capsule TAKE 1 CAPSULE BY MOUTH 3 TIMES WEEKLY 24 capsule 0   No current facility-administered medications for this encounter.     Physical Findings:  vitals were not taken for this visit.  Unable to assess due to telephone visit format.  Lab Findings: Lab Results  Component Value Date   WBC 4.6 04/05/2019   HGB 12.9 04/05/2019   HCT 39.5 04/05/2019   MCV 87.6 04/05/2019   PLT 143 (L) 04/05/2019     Radiographic Findings: Ct Chest W Contrast  Result Date: 03/20/2019 CLINICAL DATA:  Metastatic breast cancer. EXAM: CT CHEST, ABDOMEN, AND PELVIS WITH CONTRAST TECHNIQUE: Multidetector CT imaging of the chest, abdomen and pelvis was performed following the standard protocol during bolus administration of intravenous contrast. CONTRAST:  167m OMNIPAQUE IOHEXOL 300 MG/ML  SOLN COMPARISON:  12/01/2018 FINDINGS: CT CHEST FINDINGS Cardiovascular: The heart size is normal. No substantial pericardial effusion. Right Port-A-Cath tip is positioned in the distal SVC. Mediastinum/Nodes: No mediastinal lymphadenopathy. There is no hilar lymphadenopathy. The esophagus has normal imaging features. There is no axillary lymphadenopathy. Lungs/Pleura: The central tracheobronchial airways are patent. 4 mm left lower lobe pulmonary nodule (53/6) is stable. Scattered minimal atelectasis noted right middle lobe and lingula. No pleural effusion. Musculoskeletal: Diffuse sclerotic bone disease compatible with metastatic involvement, similar to prior. CT ABDOMEN PELVIS FINDINGS Hepatobiliary: Multiple liver metastases again noted. Bandlike area of masslike differential perfusion measured previously at 9.5 x 4.0 cm is now 6.6 x 2.4 cm.  While this particular lesion appears improved, there is a new 3.4 x 2.1 cm lesion in the posterior right liver (50/2). 2.6 cm lesion inferior right liver (56/2) also appears new. Gallbladder is surgically absent. No intrahepatic or extrahepatic biliary dilation. Pancreas: Diffusely atrophic. Spleen: Unremarkable. Small splenule just inferior to the spleen contains a cystic lesion, stable in the interval. Adrenals/Urinary Tract: No adrenal nodule or mass. Kidneys unremarkable. No evidence for hydroureter. The urinary bladder appears normal for the degree of distention. Stomach/Bowel: Stomach is unremarkable. No gastric wall thickening. No evidence of outlet obstruction. Duodenum is normally positioned as is the ligament of Treitz. No small bowel wall thickening. No small bowel dilatation. The terminal ileum is normal. Soft tissue attenuation posterior to the cecal tip likely ovarian given the course of the gonadal vein. No gross colonic mass. No colonic wall thickening. Diverticular changes are noted in the left colon without evidence of diverticulitis. Vascular/Lymphatic: There is abdominal aortic atherosclerosis without aneurysm. There is no gastrohepatic or hepatoduodenal ligament lymphadenopathy. No intraperitoneal or retroperitoneal lymphadenopathy. No pelvic sidewall lymphadenopathy. Reproductive: The  uterus is unremarkable.  There is no adnexal mass. Other: Trace free fluid noted in the pelvis with small volume fluid adjacent to the liver. Musculoskeletal: Diffuse sclerotic bony metastases are similar to prior. Index lesion in the L4 vertebral body measures 18 mm today, unchanged since prior. IMPRESSION: 1. Although the dominant liver lesion appears improved in the interval, there appears to be 2 new liver metastases on today's exam, concerning for disease progression. 2. Similar appearance of the widespread sclerotic bone metastases. 3. Interval decrease in ascites. 4.  Aortic Atherosclerois (ICD10-170.0)  Electronically Signed   By: Misty Stanley M.D.   On: 03/20/2019 10:37   Ct Abdomen Pelvis W Contrast  Result Date: 03/20/2019 CLINICAL DATA:  Metastatic breast cancer. EXAM: CT CHEST, ABDOMEN, AND PELVIS WITH CONTRAST TECHNIQUE: Multidetector CT imaging of the chest, abdomen and pelvis was performed following the standard protocol during bolus administration of intravenous contrast. CONTRAST:  164m OMNIPAQUE IOHEXOL 300 MG/ML  SOLN COMPARISON:  12/01/2018 FINDINGS: CT CHEST FINDINGS Cardiovascular: The heart size is normal. No substantial pericardial effusion. Right Port-A-Cath tip is positioned in the distal SVC. Mediastinum/Nodes: No mediastinal lymphadenopathy. There is no hilar lymphadenopathy. The esophagus has normal imaging features. There is no axillary lymphadenopathy. Lungs/Pleura: The central tracheobronchial airways are patent. 4 mm left lower lobe pulmonary nodule (53/6) is stable. Scattered minimal atelectasis noted right middle lobe and lingula. No pleural effusion. Musculoskeletal: Diffuse sclerotic bone disease compatible with metastatic involvement, similar to prior. CT ABDOMEN PELVIS FINDINGS Hepatobiliary: Multiple liver metastases again noted. Bandlike area of masslike differential perfusion measured previously at 9.5 x 4.0 cm is now 6.6 x 2.4 cm. While this particular lesion appears improved, there is a new 3.4 x 2.1 cm lesion in the posterior right liver (50/2). 2.6 cm lesion inferior right liver (56/2) also appears new. Gallbladder is surgically absent. No intrahepatic or extrahepatic biliary dilation. Pancreas: Diffusely atrophic. Spleen: Unremarkable. Small splenule just inferior to the spleen contains a cystic lesion, stable in the interval. Adrenals/Urinary Tract: No adrenal nodule or mass. Kidneys unremarkable. No evidence for hydroureter. The urinary bladder appears normal for the degree of distention. Stomach/Bowel: Stomach is unremarkable. No gastric wall thickening. No evidence  of outlet obstruction. Duodenum is normally positioned as is the ligament of Treitz. No small bowel wall thickening. No small bowel dilatation. The terminal ileum is normal. Soft tissue attenuation posterior to the cecal tip likely ovarian given the course of the gonadal vein. No gross colonic mass. No colonic wall thickening. Diverticular changes are noted in the left colon without evidence of diverticulitis. Vascular/Lymphatic: There is abdominal aortic atherosclerosis without aneurysm. There is no gastrohepatic or hepatoduodenal ligament lymphadenopathy. No intraperitoneal or retroperitoneal lymphadenopathy. No pelvic sidewall lymphadenopathy. Reproductive: The uterus is unremarkable.  There is no adnexal mass. Other: Trace free fluid noted in the pelvis with small volume fluid adjacent to the liver. Musculoskeletal: Diffuse sclerotic bony metastases are similar to prior. Index lesion in the L4 vertebral body measures 18 mm today, unchanged since prior. IMPRESSION: 1. Although the dominant liver lesion appears improved in the interval, there appears to be 2 new liver metastases on today's exam, concerning for disease progression. 2. Similar appearance of the widespread sclerotic bone metastases. 3. Interval decrease in ascites. 4.  Aortic Atherosclerois (ICD10-170.0) Electronically Signed   By: EMisty StanleyM.D.   On: 03/20/2019 10:37    Impression/Plan: 1. 63y.o. female with progressive dural disease secondary to metastatic breast cancer. She appears to have recovered well from the  effects of her recent radiotherapy.  She is currently without complaints.  We will plan to obtain a follow-up MRI brain to assess her treatment response in approximately 2 months and we will see her back in the office thereafter to review results and recommendations from the multidisciplinary brain conference at that time.  Pending those results are stable, we will continue with serial MRI brain scans every 3 months to monitor  for any evidence of disease progression or recurrence.  She does not currently have a scheduled follow up with Dr. Mickeal Skinner but will contact Shelle Iron, RN to inquire about scheduling for continued monitoring of neurologic status given her history of seizure activity.  She has continued taking Keppra 1045m po BID and is tapering off Dexamethasone at the direction of Dr. VMickeal Skinner currently taking 1 mg po qd. She will also continue in routine follow up with Dr. KIrene Limbofor continued systemic disease management.  She knows to call at anytime in the interim with any questions or concerns related to her previous radiotherapy.  I spent 25 minutes in telephone conversation with the patient and more than 50% of that time was spent in counseling and/or coordination of care.    ANicholos Johns PA-C

## 2019-04-12 ENCOUNTER — Inpatient Hospital Stay: Payer: BC Managed Care – PPO

## 2019-04-12 ENCOUNTER — Other Ambulatory Visit: Payer: Self-pay

## 2019-04-12 ENCOUNTER — Inpatient Hospital Stay (HOSPITAL_BASED_OUTPATIENT_CLINIC_OR_DEPARTMENT_OTHER): Payer: BC Managed Care – PPO | Admitting: Hematology

## 2019-04-12 ENCOUNTER — Telehealth: Payer: Self-pay | Admitting: Hematology

## 2019-04-12 VITALS — BP 100/64 | HR 100 | Temp 97.9°F | Resp 18 | Ht 65.0 in | Wt 171.2 lb

## 2019-04-12 DIAGNOSIS — C7931 Secondary malignant neoplasm of brain: Secondary | ICD-10-CM

## 2019-04-12 DIAGNOSIS — C50919 Malignant neoplasm of unspecified site of unspecified female breast: Secondary | ICD-10-CM

## 2019-04-12 DIAGNOSIS — Z7982 Long term (current) use of aspirin: Secondary | ICD-10-CM

## 2019-04-12 DIAGNOSIS — C50812 Malignant neoplasm of overlapping sites of left female breast: Secondary | ICD-10-CM

## 2019-04-12 DIAGNOSIS — C7800 Secondary malignant neoplasm of unspecified lung: Secondary | ICD-10-CM | POA: Diagnosis not present

## 2019-04-12 DIAGNOSIS — Z170 Estrogen receptor positive status [ER+]: Secondary | ICD-10-CM

## 2019-04-12 DIAGNOSIS — Z9089 Acquired absence of other organs: Secondary | ICD-10-CM

## 2019-04-12 DIAGNOSIS — C787 Secondary malignant neoplasm of liver and intrahepatic bile duct: Secondary | ICD-10-CM

## 2019-04-12 DIAGNOSIS — C7951 Secondary malignant neoplasm of bone: Secondary | ICD-10-CM | POA: Diagnosis not present

## 2019-04-12 DIAGNOSIS — Z7189 Other specified counseling: Secondary | ICD-10-CM

## 2019-04-12 DIAGNOSIS — K047 Periapical abscess without sinus: Secondary | ICD-10-CM

## 2019-04-12 DIAGNOSIS — Z803 Family history of malignant neoplasm of breast: Secondary | ICD-10-CM

## 2019-04-12 DIAGNOSIS — F17210 Nicotine dependence, cigarettes, uncomplicated: Secondary | ICD-10-CM

## 2019-04-12 DIAGNOSIS — D702 Other drug-induced agranulocytosis: Secondary | ICD-10-CM

## 2019-04-12 DIAGNOSIS — Z95828 Presence of other vascular implants and grafts: Secondary | ICD-10-CM

## 2019-04-12 DIAGNOSIS — Z79899 Other long term (current) drug therapy: Secondary | ICD-10-CM

## 2019-04-12 DIAGNOSIS — G893 Neoplasm related pain (acute) (chronic): Secondary | ICD-10-CM

## 2019-04-12 LAB — CBC WITH DIFFERENTIAL (CANCER CENTER ONLY)
Abs Immature Granulocytes: 0.21 10*3/uL — ABNORMAL HIGH (ref 0.00–0.07)
Basophils Absolute: 0 10*3/uL (ref 0.0–0.1)
Basophils Relative: 1 %
Eosinophils Absolute: 0.1 10*3/uL (ref 0.0–0.5)
Eosinophils Relative: 2 %
HCT: 38.2 % (ref 36.0–46.0)
Hemoglobin: 12.7 g/dL (ref 12.0–15.0)
Immature Granulocytes: 7 %
Lymphocytes Relative: 36 %
Lymphs Abs: 1.1 10*3/uL (ref 0.7–4.0)
MCH: 29.1 pg (ref 26.0–34.0)
MCHC: 33.2 g/dL (ref 30.0–36.0)
MCV: 87.6 fL (ref 80.0–100.0)
Monocytes Absolute: 0.4 10*3/uL (ref 0.1–1.0)
Monocytes Relative: 13 %
Neutro Abs: 1.2 10*3/uL — ABNORMAL LOW (ref 1.7–7.7)
Neutrophils Relative %: 41 %
Platelet Count: 198 10*3/uL (ref 150–400)
RBC: 4.36 MIL/uL (ref 3.87–5.11)
RDW: 17.3 % — ABNORMAL HIGH (ref 11.5–15.5)
WBC Count: 3 10*3/uL — ABNORMAL LOW (ref 4.0–10.5)
nRBC: 2.7 % — ABNORMAL HIGH (ref 0.0–0.2)

## 2019-04-12 LAB — CMP (CANCER CENTER ONLY)
ALT: 76 U/L — ABNORMAL HIGH (ref 0–44)
AST: 44 U/L — ABNORMAL HIGH (ref 15–41)
Albumin: 2.9 g/dL — ABNORMAL LOW (ref 3.5–5.0)
Alkaline Phosphatase: 390 U/L — ABNORMAL HIGH (ref 38–126)
Anion gap: 11 (ref 5–15)
BUN: 12 mg/dL (ref 8–23)
CO2: 26 mmol/L (ref 22–32)
Calcium: 9.6 mg/dL (ref 8.9–10.3)
Chloride: 103 mmol/L (ref 98–111)
Creatinine: 0.82 mg/dL (ref 0.44–1.00)
GFR, Est AFR Am: >60 mL/min (ref >60)
GFR, Estimated: >60 mL/min (ref >60)
Glucose, Bld: 197 mg/dL — ABNORMAL HIGH (ref 70–99)
Potassium: 3.4 mmol/L — ABNORMAL LOW (ref 3.5–5.1)
Sodium: 140 mmol/L (ref 135–145)
Total Bilirubin: 0.4 mg/dL (ref 0.3–1.2)
Total Protein: 6.4 g/dL — ABNORMAL LOW (ref 6.5–8.1)

## 2019-04-12 LAB — MAGNESIUM: Magnesium: 1.8 mg/dL (ref 1.7–2.4)

## 2019-04-12 MED ORDER — PEGFILGRASTIM INJECTION 6 MG/0.6ML ~~LOC~~: 6.0000 mg | PREFILLED_SYRINGE | Freq: Once | SUBCUTANEOUS | Status: DC

## 2019-04-12 MED ORDER — PEGFILGRASTIM-CBQV 6 MG/0.6ML ~~LOC~~ SOSY: 6.0000 mg | PREFILLED_SYRINGE | Freq: Once | SUBCUTANEOUS | Status: DC

## 2019-04-12 MED ORDER — DENOSUMAB 120 MG/1.7ML ~~LOC~~ SOLN: 120.0000 mg | Freq: Once | SUBCUTANEOUS | Status: AC

## 2019-04-12 MED ORDER — FULVESTRANT 250 MG/5ML IM SOLN: 500.0000 mg | Freq: Once | INTRAMUSCULAR | Status: AC

## 2019-04-12 MED ORDER — AMOXICILLIN-POT CLAVULANATE 875-125 MG PO TABS: 1.0000 | ORAL_TABLET | Freq: Two times a day (BID) | ORAL | 0 refills | Status: AC

## 2019-04-12 MED ORDER — PEGFILGRASTIM-CBQV 6 MG/0.6ML ~~LOC~~ SOSY
PREFILLED_SYRINGE | SUBCUTANEOUS | Status: AC
  Filled 2019-04-12: qty 0.6

## 2019-04-12 MED ORDER — FULVESTRANT 250 MG/5ML IM SOLN
INTRAMUSCULAR | Status: AC
  Filled 2019-04-12: qty 5

## 2019-04-12 MED ORDER — FULVESTRANT 250 MG/5ML IM SOLN
500.0000 mg | Freq: Once | INTRAMUSCULAR | Status: AC
  Administered 2019-04-12: 12:00:00 500 mg via INTRAMUSCULAR

## 2019-04-12 MED ORDER — DENOSUMAB 120 MG/1.7ML ~~LOC~~ SOLN
120.0000 mg | Freq: Once | SUBCUTANEOUS | Status: AC
  Administered 2019-04-12: 120 mg via SUBCUTANEOUS

## 2019-04-12 MED ORDER — PEGFILGRASTIM-CBQV 6 MG/0.6ML ~~LOC~~ SOSY
6.0000 mg | PREFILLED_SYRINGE | Freq: Once | SUBCUTANEOUS | Status: AC
  Administered 2019-04-12: 6 mg via SUBCUTANEOUS

## 2019-04-12 MED ORDER — PEGFILGRASTIM INJECTION 6 MG/0.6ML ~~LOC~~
PREFILLED_SYRINGE | SUBCUTANEOUS | Status: AC
  Filled 2019-04-12: qty 0.6

## 2019-04-12 MED ORDER — DENOSUMAB 120 MG/1.7ML ~~LOC~~ SOLN
SUBCUTANEOUS | Status: AC
  Filled 2019-04-12: qty 1.7

## 2019-04-12 NOTE — Telephone Encounter (Signed)
Per 5/21 los appts already scheduled.

## 2019-04-12 NOTE — Addendum Note (Signed)
Encounter addended by: Freeman Caldron, PA-C on: 04/12/2019 1:46 PM  Actions taken: Clinical Note Signed

## 2019-04-12 NOTE — Progress Notes (Signed)
MD wanted to give one dose of Udenyca to patient. Sandy notified authorization team.   Larene Beach, PharmD

## 2019-04-12 NOTE — Patient Instructions (Signed)
Coronavirus (COVID-19) Are you at risk?  Are you at risk for the Coronavirus (COVID-19)?  To be considered HIGH RISK for Coronavirus (COVID-19), you have to meet the following criteria:  . Traveled to [place], [place], [place], [place] or [place]; or in the [place] to [place], [place], [place], or [place]; and have fever, cough, and shortness of breath within the last 2 weeks of travel OR . Been in close contact with a person diagnosed with COVID-19 within the last 2 weeks and have fever, cough, and shortness of breath . IF YOU DO NOT MEET THESE CRITERIA, YOU ARE CONSIDERED LOW RISK FOR COVID-19.  What to do if you are HIGH RISK for COVID-19?  . If you are having a medical emergency, call 911. . Seek medical care right away. Before you go to a doctor's office, urgent care or emergency department, call ahead and tell them about your recent travel, contact with someone diagnosed with COVID-19, and your symptoms. You should receive instructions from your physician's office regarding next steps of care.  . When you arrive at healthcare provider, tell the healthcare staff immediately you have returned from visiting [place], [place], [place], [place] or [place]; or traveled in the [place] to [place], [place], [place], or [place]; in the last two weeks or you have been in close contact with a person diagnosed with COVID-19 in the last 2 weeks.   . Tell the health care staff about your symptoms: fever, cough and shortness of breath. . After you have been seen by a medical provider, you will be either: o Tested for (COVID-19) and discharged home on quarantine except to seek medical care if symptoms worsen, and asked to  - Stay home and avoid contact with others until you get your results (4-5 days)  - Avoid travel on public transportation if possible (such as bus, train, or airplane) or o Sent to the Emergency Department by EMS for evaluation, COVID-19 testing, and possible  admission depending on your condition and test results.  What to do if you are LOW RISK for COVID-19?  Reduce your risk of any infection by using the same precautions used for avoiding the common cold or flu:  . Wash your hands often with soap and warm water for at least 20 seconds.  If soap and water are not readily available, use an alcohol-based hand sanitizer with at least 60% alcohol.  . If coughing or sneezing, cover your mouth and nose by coughing or sneezing into the elbow areas of your shirt or coat, into a tissue or into your sleeve (not your hands). . Avoid shaking hands with others and consider head nods or verbal greetings only. . Avoid touching your eyes, nose, or mouth with unwashed hands.  . Avoid close contact with people who are sick. . Avoid places or events with large numbers of people in one location, like concerts or sporting events. . Carefully consider travel plans you have or are making. . If you are planning any travel outside or inside the [place], visit the CDC's Travelers' Health webpage for the latest health notices. . If you have some symptoms but not all symptoms, continue to monitor at home and seek medical attention if your symptoms worsen. . If you are having a medical emergency, call 911.   ADDITIONAL HEALTHCARE OPTIONS FOR PATIENTS  [facility] / e-Visit: [email or web address]         [facility]: [phone]  [facility] [phone]                   [facility]: [phone]   Pegfilgrastim injection What is this medicine? PEGFILGRASTIM (PEG fil gra stim) is a long-acting granulocyte colony-stimulating factor that stimulates the growth of neutrophils, a type of white blood cell important in the body's fight against infection. It is used to reduce the incidence of fever and infection in patients with certain types of cancer who are receiving chemotherapy that  affects the bone marrow, and to increase survival after being exposed to high doses of radiation. This medicine may be used for other purposes; ask your health care provider or pharmacist if you have questions. COMMON BRAND NAME(S): Fulphila, Neulasta, UDENYCA What should I tell my health care provider before I take this medicine? They need to know if you have any of these conditions: -kidney disease -latex allergy -ongoing radiation therapy -sickle cell disease -skin reactions to acrylic adhesives (On-Body Injector only) -an unusual or allergic reaction to pegfilgrastim, filgrastim, other medicines, foods, dyes, or preservatives -pregnant or trying to get pregnant -breast-feeding How should I use this medicine? This medicine is for injection under the skin. If you get this medicine at home, you will be taught how to prepare and give the pre-filled syringe or how to use the On-body Injector. Refer to the patient Instructions for Use for detailed instructions. Use exactly as directed. Tell your healthcare provider immediately if you suspect that the On-body Injector may not have performed as intended or if you suspect the use of the On-body Injector resulted in a missed or partial dose. It is important that you put your used needles and syringes in a special sharps container. Do not put them in a trash can. If you do not have a sharps container, call your pharmacist or healthcare provider to get one. Talk to your pediatrician regarding the use of this medicine in children. While this drug may be prescribed for selected conditions, precautions do apply. Overdosage: If you think you have taken too much of this medicine contact a poison control center or emergency room at once. NOTE: This medicine is only for you. Do not share this medicine with others. What if I miss a dose? It is important not to miss your dose. Call your doctor or health care professional if you miss your dose. If you miss a dose  due to an On-body Injector failure or leakage, a new dose should be administered as soon as possible using a single prefilled syringe for manual use. What may interact with this medicine? Interactions have not been studied. Give your health care provider a list of all the medicines, herbs, non-prescription drugs, or dietary supplements you use. Also tell them if you smoke, drink alcohol, or use illegal drugs. Some items may interact with your medicine. This list may not describe all possible interactions. Give your health care provider a list of all the medicines, herbs, non-prescription drugs, or dietary supplements you use. Also tell them if you smoke, drink alcohol, or use illegal drugs. Some items may interact with your medicine. What should I watch for while using this medicine? You may need blood work done while you are taking this medicine. If you are going to need a MRI, CT scan, or other procedure, tell your doctor that you are using this medicine (On-Body Injector only). What side effects may I notice from receiving this medicine? Side effects that you should report to your doctor   or health care professional as soon as possible: -allergic reactions like skin rash, itching or hives, swelling of the face, lips, or tongue -back pain -dizziness -fever -pain, redness, or irritation at site where injected -pinpoint red spots on the skin -red or dark-brown urine -shortness of breath or breathing problems -stomach or side pain, or pain at the shoulder -swelling -tiredness -trouble passing urine or change in the amount of urine Side effects that usually do not require medical attention (report to your doctor or health care professional if they continue or are bothersome): -bone pain -muscle pain This list may not describe all possible side effects. Call your doctor for medical advice about side effects. You may report side effects to FDA at 1-800-FDA-1088. Where should I keep my  medicine? Keep out of the reach of children. If you are using this medicine at home, you will be instructed on how to store it. Throw away any unused medicine after the expiration date on the label. NOTE: This sheet is a summary. It may not cover all possible information. If you have questions about this medicine, talk to your doctor, pharmacist, or health care provider.  2019 Elsevier/Gold Standard (2018-02-13 16:57:08)  

## 2019-04-13 ENCOUNTER — Ambulatory Visit: Payer: BC Managed Care – PPO

## 2019-04-13 DIAGNOSIS — M545 Low back pain, unspecified: Secondary | ICD-10-CM

## 2019-04-13 DIAGNOSIS — I890 Lymphedema, not elsewhere classified: Secondary | ICD-10-CM | POA: Diagnosis not present

## 2019-04-13 DIAGNOSIS — R2689 Other abnormalities of gait and mobility: Secondary | ICD-10-CM

## 2019-04-13 DIAGNOSIS — R29898 Other symptoms and signs involving the musculoskeletal system: Secondary | ICD-10-CM

## 2019-04-13 NOTE — Therapy (Signed)
[place], [place], [address] Phone: [phone]   Fax:  [phone]  Physical Therapy Treatment  Patient Details  Name: Stacey Cox MRN: [id number] Date of Birth: [birth year] Referring Provider (PT): Dr. Sullivan Lone   Encounter Date: 04/13/2019  PT End of Session - 04/13/19 1058    Visit Number  3    Number of Visits  13    Date for PT Re-Evaluation  05/11/19    PT Start Time  1004    PT Stop Time  1051    PT Time Calculation (min)  47 min    Activity Tolerance  Patient tolerated treatment well    Behavior During Therapy  [facility] for tasks assessed/performed       Past Medical History:  Diagnosis Date  . Cancer [facility])    Metastatic Breast Cancer    Past Surgical History:  Procedure Laterality Date  . CHOLECYSTECTOMY    . IR IMAGING GUIDED PORT INSERTION  07/14/2018  . TONSILLECTOMY      There were no vitals filed for this visit.  Subjective Assessment - 04/13/19 1013    Subjective  The bandage got wet yesterday when I showered. I got a "cast bag" to cover it but it wasn't long enough so I put plastic at the top but I guess not enough. But the bandage felt fine until I had to take it off. The arm was a little smaller when I took the bandages off though so that was nice. I have an abscess in my gum and I started an antibiotic for that but can't remember the name.     Pertinent History  Back pain led to diagnosis of left breast cancer 01/20/17. 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 Multifocal tumor in the left breast with biopsy-proven left axillary metastases and metastases in bones and lung.   Has hd chemotherapy and radiation to her low back, 10 treatments  which helped with the pain. No other health issues. 04/10/19: She is currently undergoing chemotherapy with once a month infusions. she has completed brain radiation and has mets to liver and widespread sclerotic  bone mets     Patient Stated Goals  to get lymphedema under control     Currently in Pain?  No/denies            LYMPHEDEMA/ONCOLOGY QUESTIONNAIRE - 04/13/19 1015      Left Upper Extremity Lymphedema   10 cm Proximal to Olecranon Process  32 cm    Olecranon Process  28.9 cm    15 cm Proximal to Ulnar Styloid Process  29 cm    10 cm Proximal to Ulnar Styloid Process  26.7 cm    Just Proximal to Ulnar Styloid Process  19.8 cm    Across Hand at [employer]  19.2 cm    At [place] of 2nd Digit  6.6 cm                [facility] Adult PT Treatment/Exercise - 04/13/19 0001      Manual Therapy   Manual Lymphatic Drainage (MLD)  In Supine: Short neck, superficial and deep abdominals, Lt inguinal and Rt axillary nodes, Lt axillo-inguinal and anterior inter-axillary anastomosis, then Lt UE from lateral upper arm to dorsal hand working from proximal to distal then retracing all steps.    Compression Bandaging  lotion applied to arm, then thick stockinette, elastomull to fingers 1-4, one artiflex, 1- 6, 1-8, 1-10 cm short  stretch bandages from hand to axilla                  PT Long Term Goals - 04/10/19 1737      PT LONG TERM GOAL #1   Title  Pt will haver reduction of circumference of left arm at 10 cm proximal to ulnar styloid by 3 cm     Time  4    Period  Weeks    Status  New      PT LONG TERM GOAL #2   Title  Pt will be independent in management of lymphedema at home with self manual lymph draiange and use of compression garments     Time  4    Period  Weeks    Status  New      PT LONG TERM GOAL #3   Title  Pt will be independent in conservative managment of lymphedema with elevation and exercise     Time  4    Period  Weeks    Status  New            Plan - 04/13/19 1058    Clinical Impression Statement  Pt took bandags off yesterday when she showered and they got wet. She reports noticing reduction of arm at that time but some return of fluid since  then. Her circumference measurements did show some good reductions today from eval. Continued with complete decongestive therapy today as pt is tolerating this very well. Her chemo yesterday was put on hold due to, per her report, an abscess, in her gum that she was put on antibiotic for but her WBC count is lower due to this.     Personal Factors and Comorbidities  Comorbidity 3+   lives alone   Comorbidities  metastatic cancer to bone, brain, liver,  axillary nodes     Stability/Clinical Decision Making  Evolving/Moderate complexity    PT Frequency  3x / week    PT Duration  4 weeks    PT Treatment/Interventions  ADLs/Self Care Home Management;Therapeutic exercise;Therapeutic activities;Patient/family education;Orthotic Fit/Training;Manual techniques;Manual lymph drainage;Compression bandaging;Passive range of motion;Taping;Vasopneumatic Device    PT Next Visit Plan  Cont MLD (and instruct pt in this, issue handout) and apply compression bandages. Cont with process of getting compression garments for management at home.     Consulted and Agree with Plan of Care  Patient       Patient will benefit from skilled therapeutic intervention in order to improve the following deficits and impairments:  Decreased knowledge of use of DME, Postural dysfunction, Impaired UE functional use, Increased edema  Visit Diagnosis: Lymphedema of left arm  Other abnormalities of gait and mobility  Low back pain associated with a spinal disorder other than radiculopathy or spinal stenosis  Other symptoms and signs involving the musculoskeletal system     Problem List Patient Active Problem List   Diagnosis Date Noted  . Palliative care by specialist   . DNR (do not resuscitate)   . Expressive aphasia   . Brain metastasis ([place]) 01/27/2019  . Seizures ([place]) 07/21/2018  . Macrocytic anemia 07/21/2018  . Thrombocytopenia ([place]) 07/21/2018  . Mild renal insufficiency 07/21/2018  . Anxiety 07/21/2018  .  Brain metastases ([place])   . Malignant neoplasm of female breast ([place])   . Breast cancer metastasized to brain ([place]) 07/20/2018  . Port-A-Cath in place 07/17/2018  . Liver metastases ([place]) 07/12/2018  . Genetic testing 08/08/2017  . Palliative care status   .  Counseling regarding advance care planning and goals of care 03/25/2017  . 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 Nausea without vomiting 03/21/2017  . Bone metastases ([place]) 02/01/2017  . 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 Malignant neoplasm metastatic to lung ([place])   . Cancer associated pain   . Carcinoma of breast metastatic to brain ([place]) 01/21/2017  . Pathologic fracture   . Breast mass, left     Otelia Limes, PTA 04/13/2019, 11:03 AM  [place], [place], [address] Phone: [phone]   Fax:  [phone]  Name: Stacey Cox MRN: [id number] Date of Birth: 01-12-1956

## 2019-04-16 ENCOUNTER — Other Ambulatory Visit: Payer: Self-pay | Admitting: Hematology

## 2019-04-17 ENCOUNTER — Inpatient Hospital Stay: Payer: BC Managed Care – PPO

## 2019-04-18 ENCOUNTER — Other Ambulatory Visit: Payer: Self-pay

## 2019-04-18 ENCOUNTER — Ambulatory Visit: Payer: BC Managed Care – PPO

## 2019-04-18 DIAGNOSIS — I890 Lymphedema, not elsewhere classified: Secondary | ICD-10-CM

## 2019-04-18 DIAGNOSIS — R29898 Other symptoms and signs involving the musculoskeletal system: Secondary | ICD-10-CM

## 2019-04-18 DIAGNOSIS — R2689 Other abnormalities of gait and mobility: Secondary | ICD-10-CM

## 2019-04-18 DIAGNOSIS — M545 Low back pain, unspecified: Secondary | ICD-10-CM

## 2019-04-18 NOTE — Therapy (Signed)
[place], [place], [address] Phone: [phone]   Fax:  [phone]  Physical Therapy Treatment  Patient Details  Name: Stacey Cox MRN: [id number] Date of Birth: [birth year] Referring Provider (PT): Dr. Sullivan Lone   Encounter Date: 04/18/2019  PT End of Session - 04/18/19 1326    Visit Number  4    Number of Visits  13    Date for PT Re-Evaluation  05/11/19    PT Start Time  1237    PT Stop Time  1320    PT Time Calculation (min)  43 min    Activity Tolerance  Patient tolerated treatment well    Behavior During Therapy  [facility] for tasks assessed/performed       Past Medical History:  Diagnosis Date  . Cancer [facility])    Metastatic Breast Cancer    Past Surgical History:  Procedure Laterality Date  . CHOLECYSTECTOMY    . IR IMAGING GUIDED PORT INSERTION  07/14/2018  . TONSILLECTOMY      There were no vitals filed for this visit.  Subjective Assessment - 04/18/19 1244    Subjective  My bandage came off Saturday morning and I tried to rewrap it (the video my sister tried to take didn't record all the way). It became uncomfortable in my fingers Sunday so I took it back off and put it back on yesterday bc my hand was swelling again. But my arm looked great Saturday morning when your bandage came off.     Pertinent History  Back pain led to diagnosis of left breast cancer 01/20/17. 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 Multifocal tumor in the left breast with biopsy-proven left axillary metastases and metastases in bones and lung.   Has hd chemotherapy and radiation to her low back, 10 treatments  which helped with the pain. No other health issues. 04/10/19: She is currently undergoing chemotherapy with once a month infusions. she has completed brain radiation and has mets to liver and widespread sclerotic bone mets     Patient Stated Goals  to get lymphedema under control      Currently in Pain?  No/denies                       [facility] Adult PT Treatment/Exercise - 04/18/19 0001      Manual Therapy   Manual Lymphatic Drainage (MLD)  In Supine: Short neck, superficial and deep abdominals, Lt inguinal and Rt axillary nodes, Lt axillo-inguinal and anterior inter-axillary anastomosis, then Lt UE from lateral upper arm to dorsal hand working from proximal to distal then retracing all steps.    Compression Bandaging  lotion applied to arm, then thick stockinette, elastomull to fingers 1-4 then fixated with paper tape, one artiflex, 1- 6, 1-8, 1-10 cm short stretch bandages from hand to axilla             PT Education - 04/18/19 1330    Education provided  Yes    Education Details  Compression bandaging    Person(s) Educated  Patient    Methods  Explanation;Demonstration;Handout    Comprehension  Verbalized understanding;Need further instruction          PT Long Term Goals - 04/10/19 1737      PT LONG TERM GOAL #1   Title  Pt will haver reduction of circumference of left arm at 10 cm proximal to ulnar styloid by 3 cm  Time  4    Period  Weeks    Status  New      PT LONG TERM GOAL #2   Title  Pt will be independent in management of lymphedema at home with self manual lymph draiange and use of compression garments     Time  4    Period  Weeks    Status  New      PT LONG TERM GOAL #3   Title  Pt will be independent in conservative managment of lymphedema with elevation and exercise     Time  4    Period  Weeks    Status  New            Plan - 04/18/19 1326    Clinical Impression Statement  Overall pt is tolerating bandaging well. She reports bandages came off Saturday morning due to being loose and her arm looked greatly reduced then. She reapplied them 2x since then but reports video her sister took didn't record the whole time so she was bandaging from memory. Pt did fairly well though had very little of the 6 cm on her  hand, more at her wrist so her dorsal hand was mod increased today. Focused manual lymph drainage at forearm and hand where swelling had increased the most. While bandaging reviewed proper technique, especially at hand and issued handout. Pt though now has M, W, and F appts so shouldn't need to rebandage herself much if at all anymore.     Personal Factors and Comorbidities  Comorbidity 3+   lives alone   Comorbidities  metastatic cancer to bone, brain, liver,  axillary nodes     Stability/Clinical Decision Making  Evolving/Moderate complexity    PT Frequency  3x / week    PT Duration  4 weeks    PT Treatment/Interventions  ADLs/Self Care Home Management;Therapeutic exercise;Therapeutic activities;Patient/family education;Orthotic Fit/Training;Manual techniques;Manual lymph drainage;Compression bandaging;Passive range of motion;Taping;Vasopneumatic Device    PT Next Visit Plan  Cont MLD (and instruct pt in this, issue handout) and apply compression bandages (review if needed). Cont with process of getting compression garments for management at home.     Consulted and Agree with Plan of Care  Patient       Patient will benefit from skilled therapeutic intervention in order to improve the following deficits and impairments:  Decreased knowledge of use of DME, Postural dysfunction, Impaired UE functional use, Increased edema  Visit Diagnosis: Lymphedema of left arm  Other abnormalities of gait and mobility  Low back pain associated with a spinal disorder other than radiculopathy or spinal stenosis  Other symptoms and signs involving the musculoskeletal system     Problem List Patient Active Problem List   Diagnosis Date Noted  . Palliative care by specialist   . DNR (do not resuscitate)   . Expressive aphasia   . Brain metastasis ([place]) 01/27/2019  . Seizures ([place]) 07/21/2018  . Macrocytic anemia 07/21/2018  . Thrombocytopenia ([place]) 07/21/2018  . Mild renal insufficiency 07/21/2018   . Anxiety 07/21/2018  . Brain metastases ([place])   . Malignant neoplasm of female breast ([place])   . Breast cancer metastasized to brain ([place]) 07/20/2018  . Port-A-Cath in place 07/17/2018  . Liver metastases ([place]) 07/12/2018  . Genetic testing 08/08/2017  . Palliative care status   . Counseling regarding advance care planning and goals of care 03/25/2017  . 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 Nausea without vomiting 03/21/2017  . Bone metastases ([place]) 02/01/2017  .  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 Malignant neoplasm metastatic to lung ([place])   . Cancer associated pain   . Carcinoma of breast metastatic to brain ([place]) 01/21/2017  . Pathologic fracture   . Breast mass, left     Otelia Limes, PTA 04/18/2019, 1:30 PM  [place], [place], [address] Phone: [phone]   Fax:  [phone]  Name: Stacey Cox MRN: [id number] Date of Birth: Dec 28, 1955

## 2019-04-19 ENCOUNTER — Telehealth: Payer: Self-pay | Admitting: *Deleted

## 2019-04-19 ENCOUNTER — Other Ambulatory Visit: Payer: Self-pay | Admitting: Hematology

## 2019-04-19 NOTE — Telephone Encounter (Signed)
Information given/sent to Dr. Irene Limbo

## 2019-04-19 NOTE — Telephone Encounter (Signed)
Contacted by Periodontist - Dr. Rogelio Seen. Patient has several concerning areas of infection in mouth, primiarily in back quadrants, especially in left lower. Propose to use laser therapy to clean all areas of infection. Process will go to bone. Usually treat with 7 days amoxicillin and metronidazole. Please give De. Irene Limbo this information and contact with his response.

## 2019-04-20 ENCOUNTER — Ambulatory Visit: Payer: BC Managed Care – PPO

## 2019-04-20 ENCOUNTER — Telehealth: Payer: Self-pay | Admitting: *Deleted

## 2019-04-20 DIAGNOSIS — M545 Low back pain, unspecified: Secondary | ICD-10-CM

## 2019-04-20 DIAGNOSIS — R29898 Other symptoms and signs involving the musculoskeletal system: Secondary | ICD-10-CM

## 2019-04-20 DIAGNOSIS — I890 Lymphedema, not elsewhere classified: Secondary | ICD-10-CM | POA: Diagnosis not present

## 2019-04-20 DIAGNOSIS — R2689 Other abnormalities of gait and mobility: Secondary | ICD-10-CM

## 2019-04-20 NOTE — Therapy (Signed)
[place], [place], [address] Phone: [phone]   Fax:  [phone]  Physical Therapy Treatment  Patient Details  Name: Stacey Cox MRN: [id number] Date of Birth: February 05, 1956 Referring Provider (PT): Dr. Sullivan Lone   Encounter Date: 04/20/2019  PT End of Session - 04/20/19 1105    Visit Number  5    Number of Visits  13    Date for PT Re-Evaluation  05/11/19    PT Start Time  1009   therapist running behind from previous pt   PT Stop Time  1058    PT Time Calculation (min)  49 min    Activity Tolerance  Patient tolerated treatment well    Behavior During Therapy  [facility] for tasks assessed/performed       Past Medical History:  Diagnosis Date  . Cancer [facility])    Metastatic Breast Cancer    Past Surgical History:  Procedure Laterality Date  . CHOLECYSTECTOMY    . IR IMAGING GUIDED PORT INSERTION  07/14/2018  . TONSILLECTOMY      There were no vitals filed for this visit.  Subjective Assessment - 04/20/19 1010    Subjective  I got my bandage soaked in the shower this morning. I just left it in since I was coming right here.     Pertinent History  Back pain led to diagnosis of left breast cancer 01/20/17. 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 Multifocal tumor in the left breast with biopsy-proven left axillary metastases and metastases in bones and lung.   Has hd chemotherapy and radiation to her low back, 10 treatments  which helped with the pain. No other health issues. 04/10/19: She is currently undergoing chemotherapy with once a month infusions. she has completed brain radiation and has mets to liver and widespread sclerotic bone mets     Patient Stated Goals  to get lymphedema under control     Currently in Pain?  No/denies            LYMPHEDEMA/ONCOLOGY QUESTIONNAIRE - 04/20/19 1014      Left Upper Extremity Lymphedema   10 cm Proximal to Olecranon  Process  31.5 cm    Olecranon Process  28.2 cm    15 cm Proximal to Ulnar Styloid Process  26.9 cm    10 cm Proximal to Ulnar Styloid Process  26.1 cm    Just Proximal to Ulnar Styloid Process  19.9 cm    Across Hand at [employer]  18.8 cm    At [place] of 2nd Digit  6.6 cm                [facility] Adult PT Treatment/Exercise - 04/20/19 0001      Manual Therapy   Edema Management  Circumference mesurements taken and skin assessed; redness noted from irritation superior to medial wrist, but no openings and pt reports area slightly itchy.    Manual Lymphatic Drainage (MLD)  In Supine: Short neck, superficial and deep abdominals, Lt inguinal and Rt axillary nodes, Lt axillo-inguinal and anterior inter-axillary anastomosis, then Lt UE from lateral upper arm to dorsal hand working from proximal to distal then retracing all steps.     Compression Bandaging  All bandages wet from shower this morning so issued new bandages and stockinette: lotion applied to arm, then thick stockinette, elastomull to fingers 1-4 with 1/4" gray foam to dorsal hand/knuckles up to superior to wrist, one artiflex, 1- 6, 1-10, 1-12  cm short stretch bandages from hand to axilla                  PT Long Term Goals - 04/10/19 1737      PT LONG TERM GOAL #1   Title  Pt will haver reduction of circumference of left arm at 10 cm proximal to ulnar styloid by 3 cm     Time  4    Period  Weeks    Status  New      PT LONG TERM GOAL #2   Title  Pt will be independent in management of lymphedema at home with self manual lymph draiange and use of compression garments     Time  4    Period  Weeks    Status  New      PT LONG TERM GOAL #3   Title  Pt will be independent in conservative managment of lymphedema with elevation and exercise     Time  4    Period  Weeks    Status  New            Plan - 04/20/19 1106    Clinical Impression Statement  Pt continues to tolerate bandaging very well except for  she struggles with properly covering bandage for showers. Have tried to come up with other options, and decided that morning of appts pt will take off bandages as close to appt as possible to shower and bring bandages with her. Her circumference measurements were greatly reduced at forearm today with good reductions noted elsewhere. Pt is pleased with progress thus far. Reminded her she could also remove bandages to shower and redon herself as she has been instructed a few times now but she is nervous she won't be able to do it correctly.     Personal Factors and Comorbidities  Comorbidity 3+   lives alone   Comorbidities  metastatic cancer to bone, brain, liver,  axillary nodes     Stability/Clinical Decision Making  Evolving/Moderate complexity    PT Frequency  3x / week    PT Duration  4 weeks    PT Treatment/Interventions  ADLs/Self Care Home Management;Therapeutic exercise;Therapeutic activities;Patient/family education;Orthotic Fit/Training;Manual techniques;Manual lymph drainage;Compression bandaging;Passive range of motion;Taping;Vasopneumatic Device    PT Next Visit Plan  Cont MLD (and instruct pt in this, issue handout) and apply compression bandages (review if needed). Cont with process of getting compression garments for management at home.     Consulted and Agree with Plan of Care  Patient       Patient will benefit from skilled therapeutic intervention in order to improve the following deficits and impairments:  Decreased knowledge of use of DME, Postural dysfunction, Impaired UE functional use, Increased edema  Visit Diagnosis: Lymphedema of left arm  Other abnormalities of gait and mobility  Low back pain associated with a spinal disorder other than radiculopathy or spinal stenosis  Other symptoms and signs involving the musculoskeletal system     Problem List Patient Active Problem List   Diagnosis Date Noted  . Palliative care by specialist   . DNR (do not  resuscitate)   . Expressive aphasia   . Brain metastasis ([place]) 01/27/2019  . Seizures ([place]) 07/21/2018  . Macrocytic anemia 07/21/2018  . Thrombocytopenia ([place]) 07/21/2018  . Mild renal insufficiency 07/21/2018  . Anxiety 07/21/2018  . Brain metastases ([place])   . Malignant neoplasm of female breast ([place])   . Breast cancer metastasized to brain ([place]) 07/20/2018  .  Port-A-Cath in place 07/17/2018  . Liver metastases ([place]) 07/12/2018  . Genetic testing 08/08/2017  . Palliative care status   . Counseling regarding advance care planning and goals of care 03/25/2017  . 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 Nausea without vomiting 03/21/2017  . Bone metastases ([place]) 02/01/2017  . 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 Malignant neoplasm metastatic to lung ([place])   . Cancer associated pain   . Carcinoma of breast metastatic to brain ([place]) 01/21/2017  . Pathologic fracture   . Breast mass, left     Otelia Limes, PTA 04/20/2019, 11:10 AM  [facility] [place], [place], [address] Phone: [phone]   Fax:  [phone]  Name: Vernice Mannina MRN: [id number] Date of Birth: 1956-01-08

## 2019-04-20 NOTE — Telephone Encounter (Signed)
All information regarding Dr. Lewayne Bunting periodontal treatment plan given to Dr. Irene Limbo and he did not request any changes in plan. Dr. Osborne Serio Cockayne contacted and given this information at Dr. Grier Mitts request.

## 2019-04-23 ENCOUNTER — Other Ambulatory Visit: Payer: Self-pay

## 2019-04-23 ENCOUNTER — Ambulatory Visit: Payer: BC Managed Care – PPO | Attending: Hematology

## 2019-04-23 DIAGNOSIS — M545 Low back pain, unspecified: Secondary | ICD-10-CM

## 2019-04-23 DIAGNOSIS — I890 Lymphedema, not elsewhere classified: Secondary | ICD-10-CM | POA: Diagnosis not present

## 2019-04-23 DIAGNOSIS — R2689 Other abnormalities of gait and mobility: Secondary | ICD-10-CM

## 2019-04-23 DIAGNOSIS — R29898 Other symptoms and signs involving the musculoskeletal system: Secondary | ICD-10-CM | POA: Diagnosis present

## 2019-04-23 NOTE — Therapy (Signed)
[place], [place], [address] Phone: [phone]   Fax:  [phone]  Physical Therapy Treatment  Patient Details  Name: Stacey Cox MRN: [id number] Date of Birth: July 30, 1956 Referring Provider (PT): Dr. Sullivan Lone   Encounter Date: 04/23/2019  PT End of Session - 04/23/19 1054    Visit Number  6    Number of Visits  13    Date for PT Re-Evaluation  05/11/19    PT Start Time  1834    PT Stop Time  1047    PT Time Calculation (min)  45 min    Activity Tolerance  Patient tolerated treatment well    Behavior During Therapy  [facility] for tasks assessed/performed       Past Medical History:  Diagnosis Date  . Cancer [facility])    Metastatic Breast Cancer    Past Surgical History:  Procedure Laterality Date  . CHOLECYSTECTOMY    . IR IMAGING GUIDED PORT INSERTION  07/14/2018  . TONSILLECTOMY      There were no vitals filed for this visit.  Subjective Assessment - 04/23/19 1006    Subjective  I left my bandages on all weekend and just took them off this mornin to shower before coming. My arm has swelled back up just a bit since then but it looked so good when I tokk off the bandages. It's going really well!    Pertinent History  Back pain led to diagnosis of left breast cancer 01/20/17. 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 Multifocal tumor in the left breast with biopsy-proven left axillary metastases and metastases in bones and lung.   Has hd chemotherapy and radiation to her low back, 10 treatments  which helped with the pain. No other health issues. 04/10/19: She is currently undergoing chemotherapy with once a month infusions. she has completed brain radiation and has mets to liver and widespread sclerotic bone mets     Patient Stated Goals  to get lymphedema under control     Currently in Pain?  No/denies                       [facility] Adult PT  Treatment/Exercise - 04/23/19 0001      Manual Therapy   Manual Lymphatic Drainage (MLD)  In Supine: Short neck, superficial and deep abdominals, Lt inguinal and Rt axillary nodes, Lt axillo-inguinal and anterior inter-axillary anastomosis, then Lt UE from lateral upper arm to dorsal hand working from proximal to distal then retracing all steps.     Compression Bandaging  Thick massage cream applied to arm, then thick stockinette, elastomull to fingers 1-4 (fixated with paper tape) with 1/4" gray foam to dorsal hand/knuckles up to superior to wrist, one artiflex, 1- 6, 1-10, 1-12 cm short stretch bandages from hand to axilla                  PT Long Term Goals - 04/10/19 1737      PT LONG TERM GOAL #1   Title  Pt will haver reduction of circumference of left arm at 10 cm proximal to ulnar styloid by 3 cm     Time  4    Period  Weeks    Status  New      PT LONG TERM GOAL #2   Title  Pt will be independent in management of lymphedema at home with self manual lymph draiange and use of compression garments  Time  4    Period  Weeks    Status  New      PT LONG TERM GOAL #3   Title  Pt will be independent in conservative managment of lymphedema with elevation and exercise     Time  4    Period  Weeks    Status  New            Plan - 04/23/19 1055    Clinical Impression Statement  Pt is tolerating bandaging very well. Slight redness at her medial wrist is now gone as is the increased swelling she had over her knuckles, so continued with use of gray foam. Pt reports bandages feeling comfortable at end of session and she is overall pleased with reduction of her arm thus far. Aldo updated pt that we have been in contact with fitter and she will be coming to our clinic soon to measure patients, Ms. Pardy would like to meet with her to be measured when this happens. If affordable, she would also like a daytime sleeve and velcro garment for night.     Personal Factors and  Comorbidities  Comorbidity 3+   lives alone   Comorbidities  metastatic cancer to bone, brain, liver,  axillary nodes     Stability/Clinical Decision Making  Evolving/Moderate complexity    PT Frequency  3x / week    PT Duration  4 weeks    PT Treatment/Interventions  ADLs/Self Care Home Management;Therapeutic exercise;Therapeutic activities;Patient/family education;Orthotic Fit/Training;Manual techniques;Manual lymph drainage;Compression bandaging;Passive range of motion;Taping;Vasopneumatic Device    PT Next Visit Plan  Cont MLD (and instruct pt in this, issue handout) and apply compression bandages. Get pt scheduled with fitter once we have a date of her coming to clinic.     Consulted and Agree with Plan of Care  Patient       Patient will benefit from skilled therapeutic intervention in order to improve the following deficits and impairments:  Decreased knowledge of use of DME, Postural dysfunction, Impaired UE functional use, Increased edema  Visit Diagnosis: Lymphedema of left arm  Other abnormalities of gait and mobility  Low back pain associated with a spinal disorder other than radiculopathy or spinal stenosis  Other symptoms and signs involving the musculoskeletal system     Problem List Patient Active Problem List   Diagnosis Date Noted  . Palliative care by specialist   . DNR (do not resuscitate)   . Expressive aphasia   . Brain metastasis ([place]) 01/27/2019  . Seizures ([place]) 07/21/2018  . Macrocytic anemia 07/21/2018  . Thrombocytopenia ([place]) 07/21/2018  . Mild renal insufficiency 07/21/2018  . Anxiety 07/21/2018  . Brain metastases ([place])   . Malignant neoplasm of female breast ([place])   . Breast cancer metastasized to brain ([place]) 07/20/2018  . Port-A-Cath in place 07/17/2018  . Liver metastases ([place]) 07/12/2018  . Genetic testing 08/08/2017  . Palliative care status   . Counseling regarding advance care planning and goals of care 03/25/2017  . Palmar plantar  erythrodysaesthesia 03/21/2017  . Nausea without vomiting 03/21/2017  . Bone metastases ([place]) 02/01/2017  . 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 Malignant neoplasm metastatic to lung ([place])   . Cancer associated pain   . Carcinoma of breast metastatic to brain ([place]) 01/21/2017  . Pathologic fracture   . Breast mass, left     Otelia Limes, PTA 04/23/2019, 10:59 AM  [place], [place], [address] Phone: [phone]  Fax:  [phone]  Name: Kortnie Stovall MRN: [id number] Date of Birth: [birth year]

## 2019-04-25 ENCOUNTER — Ambulatory Visit: Payer: BC Managed Care – PPO

## 2019-04-25 ENCOUNTER — Other Ambulatory Visit: Payer: Self-pay

## 2019-04-25 DIAGNOSIS — M545 Low back pain, unspecified: Secondary | ICD-10-CM

## 2019-04-25 DIAGNOSIS — I890 Lymphedema, not elsewhere classified: Secondary | ICD-10-CM

## 2019-04-25 DIAGNOSIS — R29898 Other symptoms and signs involving the musculoskeletal system: Secondary | ICD-10-CM

## 2019-04-25 DIAGNOSIS — R2689 Other abnormalities of gait and mobility: Secondary | ICD-10-CM

## 2019-04-25 NOTE — Therapy (Signed)
[place], [place], [address] Phone: [phone]   Fax:  [phone]  Physical Therapy Treatment  Patient Details  Name: Stacey Cox MRN: [id number] Date of Birth: March 04, 1956 Referring Provider (PT): Dr. Sullivan Lone   Encounter Date: 04/25/2019  PT End of Session - 04/25/19 1058    Visit Number  7    Number of Visits  13    Date for PT Re-Evaluation  05/11/19    PT Start Time  1003    PT Stop Time  1051    PT Time Calculation (min)  48 min    Activity Tolerance  Patient tolerated treatment well    Behavior During Therapy  [facility] for tasks assessed/performed       Past Medical History:  Diagnosis Date  . Cancer [facility])    Metastatic Breast Cancer    Past Surgical History:  Procedure Laterality Date  . CHOLECYSTECTOMY    . IR IMAGING GUIDED PORT INSERTION  07/14/2018  . TONSILLECTOMY      There were no vitals filed for this visit.  Subjective Assessment - 04/25/19 1007    Subjective  Just took my bandages off at 0800 to shower today. My arm is doing well. Melissa just measured me for my garments, I'm hoping to get day and velcro nighttime.     Pertinent History  Back pain led to diagnosis of left breast cancer 01/20/17. 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 Multifocal tumor in the left breast with biopsy-proven left axillary metastases and metastases in bones and lung.   Has hd chemotherapy and radiation to her low back, 10 treatments  which helped with the pain. No other health issues. 04/10/19: She is currently undergoing chemotherapy with once a month infusions. she has completed brain radiation and has mets to liver and widespread sclerotic bone mets     Patient Stated Goals  to get lymphedema under control     Currently in Pain?  No/denies            LYMPHEDEMA/ONCOLOGY QUESTIONNAIRE - 04/25/19 1030      Left Upper Extremity Lymphedema   10 cm Proximal  to Olecranon Process  31.7 cm    Olecranon Process  28.6 cm    15 cm Proximal to Ulnar Styloid Process  28.2 cm    10 cm Proximal to Ulnar Styloid Process  26.6 cm    Just Proximal to Ulnar Styloid Process  20.1 cm    Across Hand at [employer]  19.1 cm    At [place] of 2nd Digit  6.4 cm                [facility] Adult PT Treatment/Exercise - 04/25/19 0001      Manual Therapy   Manual Lymphatic Drainage (MLD)  In Supine: Short neck, superficial and deep abdominals, Lt inguinal and Rt axillary nodes, Lt axillo-inguinal and anterior inter-axillary anastomosis, then Lt UE from lateral upper arm to dorsal hand working from proximal to distal then retracing all steps.     Compression Bandaging  Thick massage cream applied to arm, then thick stockinette, elastomull to fingers 1-4 (fixated with paper tape) with 1/4" gray foam to dorsal hand/knuckles up to superior to wrist, one artiflex, 1- 6, 1-10, 1-12 cm short stretch bandages from hand to axilla                  PT Long Term Goals - 04/10/19 1737  PT LONG TERM GOAL #1   Title  Pt will haver reduction of circumference of left arm at 10 cm proximal to ulnar styloid by 3 cm     Time  4    Period  Weeks    Status  New      PT LONG TERM GOAL #2   Title  Pt will be independent in management of lymphedema at home with self manual lymph draiange and use of compression garments     Time  4    Period  Weeks    Status  New      PT LONG TERM GOAL #3   Title  Pt will be independent in conservative managment of lymphedema with elevation and exercise     Time  4    Period  Weeks    Status  New            Plan - 04/25/19 1058    Clinical Impression Statement  Ms. Satterfield was measured by fitter Melissa from [facility] right before session today. She would like to get a daytime and velcro nighttime [employer] custom garment depending on what her insurance will cover. Her circumference measurements were slightly increased from last  time measured at forearm but this is where pt tends to accumulate most fluid so eplained to her we can see some fluctuation here, especially since she takes bandages off to shower a few hours before appt. Pt verbalized understanding, and overall her measurements are still reduced from eval. She was told her garments should arrive in about a week.     Personal Factors and Comorbidities  Comorbidity 3+   lives alone   Comorbidities  metastatic cancer to bone, brain, liver,  axillary nodes     Stability/Clinical Decision Making  Evolving/Moderate complexity    PT Frequency  3x / week    PT Duration  4 weeks    PT Treatment/Interventions  ADLs/Self Care Home Management;Therapeutic exercise;Therapeutic activities;Patient/family education;Orthotic Fit/Training;Manual techniques;Manual lymph drainage;Compression bandaging;Passive range of motion;Taping;Vasopneumatic Device    PT Next Visit Plan  Cont MLD (and review with pt, issue handout) and apply compression bandages. Cont with complete decongestive therapy until garments arrive.     Consulted and Agree with Plan of Care  Patient       Patient will benefit from skilled therapeutic intervention in order to improve the following deficits and impairments:  Decreased knowledge of use of DME, Postural dysfunction, Impaired UE functional use, Increased edema  Visit Diagnosis: Lymphedema of left arm  Other abnormalities of gait and mobility  Low back pain associated with a spinal disorder other than radiculopathy or spinal stenosis  Other symptoms and signs involving the musculoskeletal system     Problem List Patient Active Problem List   Diagnosis Date Noted  . Palliative care by specialist   . DNR (do not resuscitate)   . Expressive aphasia   . Brain metastasis ([place]) 01/27/2019  . Seizures ([place]) 07/21/2018  . Macrocytic anemia 07/21/2018  . Thrombocytopenia ([place]) 07/21/2018  . Mild renal insufficiency 07/21/2018  . Anxiety 07/21/2018   . Brain metastases ([place])   . Malignant neoplasm of female breast ([place])   . Breast cancer metastasized to brain ([place]) 07/20/2018  . Port-A-Cath in place 07/17/2018  . Liver metastases ([place]) 07/12/2018  . Genetic testing 08/08/2017  . Palliative care status   . Counseling regarding advance care planning and goals of care 03/25/2017  . 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 Nausea without vomiting 03/21/2017  .  Bone metastases ([place]) 02/01/2017  . 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 Malignant neoplasm metastatic to lung ([place])   . Cancer associated pain   . Carcinoma of breast metastatic to brain ([place]) 01/21/2017  . Pathologic fracture   . Breast mass, left     Otelia Limes, PTA 04/25/2019, 11:02 AM  [place], [place], [address] Phone: [phone]   Fax:  [phone]  Name: Stacey Cox MRN: [id number] Date of Birth: 08-Nov-1956

## 2019-04-25 NOTE — Progress Notes (Signed)
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  INTERVAL HISTORY   Stacey Cox is here for follow-up of her metastatic hormone positive HER-2 negative breast cancer. The patient's last visit with [place] was on 04/12/19. The pt reports that she is doing well overall.  The pt reports that she spoke with her dentist in the interim about her dental infection and that their plan was to perform a gum injection and complete an additional antibiotic course, but has not received either of these interventions, but pt will call their office today. The pt finished her 10 day Augmentin course that I prescribed her. She notes that her dental pain has improved through antibiotic coverage.  The pt notes that she is "fatigued from boredom," and notes that she has not been walking around her neighborhood because [place] is too h[place]. We discussed walking at local greenways which are flatter. She denies feeling fatigued otherwise.  Lab results today (04/26/19) of CBC w/diff and CMP is as follows: all values are WNL except for RDW at 20.6, PLT at 75k, nRBC at 800, Abs immature granulocytes at 0.10k, Glucose at 191, Calcium at 8.7, Total Protein at 6.3, Albumin at 3.0, AST at 69, ALT at 116, Alk Phos at 486.  On review of systems, pt reports lower energy levels, eating well, gaining weight, and denies abdominal pains, leg swelling, and any other symptoms.   MEDICAL HISTORY:  Past Medical History:  Diagnosis Date   Cancer [facility])    Metastatic Breast Cancer    SURGICAL HISTORY: Past Surgical History:  Procedure Laterality Date   CHOLECYSTECTOMY     IR IMAGING GUIDED PORT INSERTION  07/14/2018   TONSILLECTOMY      SOCIAL HISTORY: Social  History   Socioeconomic History   Marital status: Single    Spouse name: Not on file   Number of children: Not on file   Years of education: Not on file   Highest education level: Not on file  Occupational History   Not on file  Social Needs   Financial resource strain: Not on file   Food insecurity:    Worry: Not on file    Inability: Not on file   Transportation needs:    Medical: Not on file    Non-medical: Not on file  Tobacco Use   Smoking status: Current Every Day Smoker    Packs/day: 0.15    Types: Cigarettes    Last attempt to quit: 01/21/2017    Years since quitting: 2.2   Smokeless tobacco: Never Used  Substance and Sexual Activity   Alcohol use: No    Alcohol/week: 0.0 standard drinks   Drug use: No   Sexual activity: Never  Lifestyle   Physical activity:    Days per week: Not on file    Minutes per session: Not on file   Stress: Not on file  Relationships   Social connections:    Talks on phone: Not on file    Gets together: Not on file    Attends religious service: Not on file    Active member of club or organization: Not on file    Attends meetings of clubs or organizations: Not on file  Relationship status: Not on file   Intimate partner violence:    Fear of current or ex partner: No    Emotionally abused: No    Physically abused: No    Forced sexual activity: No  Other Topics Concern   Not on file  Social History Narrative   Lives alone. 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 Has phobia of the medical system.  Cigarette smoker 1/2 PPD for about 40 yrs Social alcohol use No drugs Professor at [employer] no children  FAMILY HISTORY: Family History  Problem Relation Age of Onset   Cancer Mother        breast   Cancer Sister        breast  Mother with h/o breast cancer in her 22's Sister with breast cancer in her 8ys and later was diagnosed with multiple myeloma. (Patient is not aware of any specific breast cancer  mutations present)  ALLERGIES:  is allergic to thorazine [chlorpromazine] and ribociclib.  MEDICATIONS:  Current Outpatient Medications  Medication Sig Dispense Refill   aspirin EC 81 MG tablet Take 1 tablet (81 mg total) by mouth daily.     chlorhexidine (PERIDEX) 0.12 % solution Use as directed 15 mLs in the mouth or throat 2 (two) times daily as needed (mouth rinse after vomiting). Swish and spit 473 mL 1   dexamethasone (DECADRON) 1 MG tablet Take 1 tablet (1 mg total) by mouth daily. 30 tablet 2   escitalopram (LEXAPRO) 20 MG tablet TAKE 1 TABLET(20 MG) BY MOUTH DAILY 30 tablet 0   feeding supplement, ENSURE ENLIVE, (ENSURE ENLIVE) LIQD Take 237 mLs by mouth 2 (two) times daily between meals. (Patient taking differently: Take 237 mLs by mouth daily. ) 237 mL 12   hydrOXYzine (ATARAX/VISTARIL) 10 MG tablet Take 10 mg by mouth 2 (two) times daily.     levETIRAcetam (KEPPRA) 1000 MG tablet Take 1 tablet (1,000 mg total) by mouth 2 (two) times daily. 60 tablet 5   pantoprazole (PROTONIX) 40 MG tablet TAKE 1 TABLET(40 MG) BY MOUTH DAILY 30 tablet 1   Vitamin D, Ergocalciferol, (DRISDOL) 1.25 MG (50000 UT) CAPS capsule TAKE 1 CAPSULE BY MOUTH 3 TIMES WEEKLY 24 capsule 0   No current facility-administered medications for this visit.    Facility-Administered Medications Ordered in Other Visits  Medication Dose Route Frequency Provider Last Rate Last Dose   denosumab (XGEVA) injection 120 mg  120 mg Subcutaneous Once [name], MD       fulvestrant (FASLODEX) injection 500 mg  500 mg Intramuscular Once [name], MD        REVIEW OF SYSTEMS:    A 10+ POINT REVIEW OF SYSTEMS WAS OBTAINED including neurology, dermatology, psychiatry, cardiac, respiratory, lymph, extremities, GI, GU, Musculoskeletal, constitutional, breasts, reproductive, HEENT.  All pertinent positives are noted in the HPI.  All others are negative.   PHYSICAL EXAMINATION:  ECOG PERFORMANCE  STATUS: 1 - Symptomatic but completely ambulatory  Vitals:   04/26/19 1147  BP: 115/84  Pulse: (!) 103  Resp: 17  Temp: (!) 97.4 F (36.3 C)  SpO2: 97%   Filed Weights   04/26/19 1147  Weight: 174 lb 14.4 oz (79.3 kg)   Body mass index is 29.1 kg/m.   GENERAL:alert, in no acute distress and comfortable SKIN: no acute rashes, no significant lesions EYES: conjunctiva are pink and non-injected, sclera anicteric OROPHARYNX: Resolved erythema and induration over left lower jaw NECK: supple, no JVD LYMPH:  no palpable lymphadenopathy in  the cervical, axillary or inguinal regions LUNGS: clear to auscultation b/l with normal respiratory effort HEART: regular rate & rhythm ABDOMEN:  normoactive bowel sounds , non tender, not distended. No palpable hepatosplenomegaly.  Extremity: no pedal edema PSYCH: alert & oriented x 3 with fluent speech NEURO: no focal motor/sensory deficits   LABORATORY DATA:  I have reviewed the data as listed  CBC Latest Ref Rng & Units 04/26/2019 04/12/2019 04/05/2019  WBC 4.0 - 10.5 K/uL 9.7 3.0(L) 4.6  Hemoglobin 12.0 - 15.0 g/dL 12.1 12.7 12.9  Hematocrit 36.0 - 46.0 % 37.9 38.2 39.5  Platelets 150 - 400 K/uL 75(L) 198 143(L)   CBC    Component Value Date/Time   WBC 9.7 04/26/2019 1119   RBC 4.15 04/26/2019 1119   HGB 12.1 04/26/2019 1119   HGB 12.7 04/12/2019 0901   HGB 13.9 11/24/2017 1340   HCT 37.9 04/26/2019 1119   HCT 42.0 11/24/2017 1340   PLT 75 (L) 04/26/2019 1119   PLT 198 04/12/2019 0901   PLT 212 11/24/2017 1340   MCV 91.3 04/26/2019 1119   MCV 97.0 11/24/2017 1340   MCH 29.2 04/26/2019 1119   MCHC 31.9 04/26/2019 1119   RDW 20.6 (H) 04/26/2019 1119   RDW 14.1 11/24/2017 1340   LYMPHSABS 1.4 04/26/2019 1119   LYMPHSABS 1.4 11/24/2017 1340   MONOABS 0.6 04/26/2019 1119   MONOABS 0.3 11/24/2017 1340   EOSABS 0.1 04/26/2019 1119   EOSABS 0.1 11/24/2017 1340   BASOSABS 0.1 04/26/2019 1119   BASOSABS 0.1 11/24/2017 1340     . CMP Latest Ref Rng & Units 04/26/2019 04/12/2019 04/05/2019  Glucose 70 - 99 mg/dL 191(H) 197(H) 213(H)  BUN 8 - 23 mg/dL _0 Creatinine 0.44 - 1.00 mg/dL 0.72 0.82 0.82  Sodium 135 - 145 mmol/L 139 140 139  Potassium 3.5 - 5.1 mmol/L 3.8 3.4(L) 3.8  Chloride 98 - 111 mmol/L 103 103 104  CO2 22 - 32 mmol/L _1 Calcium 8.9 - 10.3 mg/dL 8.7(L) 9.6 9.1  Total Protein 6.5 - 8.1 g/dL 6.3(L) 6.4(L) 6.6  Total Bilirubin 0.3 - 1.2 mg/dL 0.6 0.4 0.4  Alkaline Phos 38 - 126 U/L 486(H) 390(H) 441(H)  AST 15 - 41 U/L 69(H) 44(H) 70(H)  ALT 0 - 44 U/L 116(H) 76(H) 103(H)   07/18/18 Pathology:          RADIOGRAPHIC STUDIES: I have personally reviewed the radiological images as listed and agreed with the findings in the report. No results found.  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 Multifocal tumor in the left breast with biopsy-proven left axillary metastases.   Noted to have extensive bone metastases and pulmonary metastases. Patient was noted to have calvarial metastases but no overt parenchymal metastasis 09/14/17 CT Chest/ad/pelvis Results discussed in details - good response.  02/22/18 CT revealed  Newly apparent 2.1 by 2.0 cm rim enhancing lesion in the right hepatic lobe suspicious for a metastatic lesion. This was not apparent on prior exams although the prior exams were all noncontrast and thus the lesion may have been present but with reduced conspicuity. There is also a small enhancing lesion further posteriorly in the right hepatic lobe which is technically nonspecific and could be a small hemangioma or a small metastatic lesion. Stable distribution and appearance of prior sclerotic osseous metastatic disease without bony progression. Stable appearance of  what appears to be an accessory spleen with a cystic lesion below the main portion of the spleen.    07/06/18 CT C/A/P which revealed New pattern of heterogeneous enhancement and enhancing ill-defined lesions in the central LEFT and RIGHT hepatic lobe at site prior enhancing lesion is highly concerning for progression of of infiltrative malignancy in the liver occupying a large portion of the RIGHT hepatic lobe and central LEFT hepatic lobe. 2. Small amount of free fluid in the abdomen pelvis. 3. No metastatic adenopathy.4. No evidence pulmonary metastasis or lymphadenopathy. 5. Stable dense sclerotic skeletal metastasis   07/11/18 ECHO revealed LV EF of 55%-60%   07/18/18 Liver needle/core biopsy right lesion consi with Ki67 at 40%, ER/PR +ve   07/28/18 MRI Abdomen MRCP revealed Extensive hepatic metastatic disease, possibly progressive from recent abdominal CT. 2. No evidence of intrahepatic or extrahepatic biliary dilatation. Elevated liver function studies may be secondary to intrahepatic cholestasis. 3. Increased ascites without peritoneal nodularity or suspicious enhancement. 4. Grossly stable widespread osseous metastatic disease.   S/p 6 cycles of AC completed on 11/01/18  03/20/19 CT C/A/P revealed "Although the dominant liver lesion appears improved in the interval, there appears to be 2 new liver metastases on today's exam, concerning for disease progression. 2. Similar appearance of the widespread sclerotic bone metastases. 3. Interval decrease in ascites. 4. Aortic Atherosclerois."  #2 Bone metastases due to breast cancer- on Xgeva. Much improved back pain.  # 3 Neutropenia Related to her Ribociclib - resolved. Patient is currently on Verzenio and has not developed any neutropenia. This is being monitored.  #4 Increased LFTs due to new liver metastases  #5 Cancer related pain - controlled - status post palliative RT to spine - no currently needing any pain medications.  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 No other new medication. Now resolved. Monitoring  on increasing doses of Verzenio. No issues with recurrent rash on Verzenio 122m po BID  #7 Brain metastasis  07/20/18 CT Head revealed 1.8 x 2.4 x 3.9 cm LEFT frontal cortical based mass with possible dural component highly concerning for metastatic disease, less likely abscess or cerebritis. Severe vasogenic edema with 5 mm LEFT to RIGHT subfalcine herniation. No ventricular entrapment. 2. 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 3. Multiple calvarial metastasis. 4. Cerebellar atrophy.    07/21/18 MRI Brain revealed  Irregular plaque-like mass, likely metastasis, measuring up to 5.1 cm, appears to originate from the left anterolateral frontal dura with invasion of the underlying frontal lobe of the brain. Mass effect from brain edema and the lesion partially effaces the frontal horn of left lateral ventricle and results in 7 mm left-to-right midline shift of anterior septum pellucidum. 2. Diffuse dural thickening over the left cerebral convexity may represent edema associated with the tumor or invasive neoplasm. 3. 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   07/28/18 MRI Brain revealed Extensive nodular dural enhancement left frontal lobe with associated enhancing mass growing in the left frontal lobe is unchanged in size and compatible with metastatic disease. This is associated with metastatic disease to the left frontal bone. Extensive edema in the left frontal lobe has progressed in the interval. Blastic metastatic disease throughout the calvarium. Metastatic disease in the cervical spine. Enhancing mass along the floor of the posterior fossa on the right is stable and most consistent with meningioma. 6 mm enhancing mass in the prepontine cistern on the  right also unchanged and compatible with meningioma.   s/p SRS radiation therapy  10/23/18 MRI Brain revealed SRS protocol demonstrating treatment response:  Regression of LEFT frontal dural metastasis, decreased vasogenic edema and parenchymal invasion. Similar calvarial metastasis. 2. Stable appearance of 2 small posterior fossa meningiomas. 3. No acute intracranial process.   02/02/19 MRI Brain revealed "Marked progression of dural disease left greater than right as described above. Pronounced brain edema, left more than right with left-to-right shift of 1 cm. 2. I do not identify any brain parenchymal metastases. 3. Small stable incidental meningiomas in the posterior fossa as above."  Pt is s/p SRS for dural metastases.  PLAN:  -Continue follow up with Dr. Mickeal Skinner in Neuro-oncology on 02/06/2019 and Dr. Tammi Klippel in [place] with Delton See every 4 weeks   #8 Vitamin D deficiency Vit D levels improved to 44.8  On 12/01/2018 -continue Vit D replacement.  #9 h/o Left upper extremity swelling- [place] neg for DVT - now swelling resolved.  PLAN:  -Discussed pt labwork today, 04/26/19; WBC have normalized to 9.7k with ANC at 7.5k. PLT decreased to 75k from 198k. No anemia. -64/20 Chemistries reviewed - LFT abnormalities -Discussed my suspicion that the patient's PLT are low due to recent Augmentin course, and that I would recommend rechecking her counts again in one week and tentatively receive C2D1 Taxol next week as opposed to today as originally planned. -Pt's planned third weekly infusion of Taxol two weeks ago was cancelled due to dental abscess s/p antibiotic course -Holding Xgeva due to recent dental concerns -Pt will call her dentist's office today  -Continue care with outpatient lymphedema PT clinic for left arm lymphedema next week on 04/10/19. -Patient to continue with Art Therapy and outpatient PT -Continue Keppra -Continue tapering dexamethasone as per Dr Mickeal Skinner - currently on 59m daily -Continue 20MEQ Potassium PO BID replacement  -Continue Ergocalciferol 50k units, three times a week, with fatty foods to aide absorption -Continue  Faslodex every 4 weeks -Will see the pt back in one week   F/u as scheduled for next visit of Taxol, MD visit, and labs in 1 week   All of the patients questions were answered with apparent satisfaction. The patient knows to call the clinic with any problems, questions or concerns.   The total time spent in the appt was 25 minutes and more than 50% was on counseling and direct patient cares.    GSullivan LoneMD MElmoreAAHIVMS SCarilion Tazewell Community HospitalCIncline Village Health CenterHematology/Oncology Physician [facility] (Office):       [phone](Work cell):  [phone](Fax):           [phone] I, SBaldwin Jamaica am acting as a scribe for Dr. GSullivan Lone   .I have reviewed the above documentation for accuracy and completeness, and I agree with the above. .Brunetta GeneraMD

## 2019-04-26 ENCOUNTER — Inpatient Hospital Stay: Payer: BC Managed Care – PPO | Attending: Hematology

## 2019-04-26 ENCOUNTER — Inpatient Hospital Stay (HOSPITAL_BASED_OUTPATIENT_CLINIC_OR_DEPARTMENT_OTHER): Payer: BC Managed Care – PPO | Admitting: Hematology

## 2019-04-26 ENCOUNTER — Other Ambulatory Visit: Payer: Self-pay

## 2019-04-26 ENCOUNTER — Telehealth: Payer: Self-pay | Admitting: *Deleted

## 2019-04-26 ENCOUNTER — Inpatient Hospital Stay: Payer: BC Managed Care – PPO

## 2019-04-26 ENCOUNTER — Telehealth: Payer: Self-pay | Admitting: Hematology

## 2019-04-26 VITALS — BP 115/84 | HR 103 | Temp 97.4°F | Resp 17 | Ht 65.0 in | Wt 174.9 lb

## 2019-04-26 DIAGNOSIS — C7931 Secondary malignant neoplasm of brain: Secondary | ICD-10-CM | POA: Insufficient documentation

## 2019-04-26 DIAGNOSIS — I890 Lymphedema, not elsewhere classified: Secondary | ICD-10-CM | POA: Insufficient documentation

## 2019-04-26 DIAGNOSIS — C50812 Malignant neoplasm of overlapping sites of left female breast: Secondary | ICD-10-CM

## 2019-04-26 DIAGNOSIS — E559 Vitamin D deficiency, unspecified: Secondary | ICD-10-CM | POA: Insufficient documentation

## 2019-04-26 DIAGNOSIS — G893 Neoplasm related pain (acute) (chronic): Secondary | ICD-10-CM

## 2019-04-26 DIAGNOSIS — Z5111 Encounter for antineoplastic chemotherapy: Secondary | ICD-10-CM | POA: Diagnosis not present

## 2019-04-26 DIAGNOSIS — Z170 Estrogen receptor positive status [ER+]: Secondary | ICD-10-CM

## 2019-04-26 DIAGNOSIS — C50919 Malignant neoplasm of unspecified site of unspecified female breast: Secondary | ICD-10-CM

## 2019-04-26 DIAGNOSIS — C787 Secondary malignant neoplasm of liver and intrahepatic bile duct: Secondary | ICD-10-CM | POA: Diagnosis not present

## 2019-04-26 DIAGNOSIS — Z5189 Encounter for other specified aftercare: Secondary | ICD-10-CM | POA: Diagnosis not present

## 2019-04-26 DIAGNOSIS — C7951 Secondary malignant neoplasm of bone: Secondary | ICD-10-CM

## 2019-04-26 DIAGNOSIS — C7800 Secondary malignant neoplasm of unspecified lung: Secondary | ICD-10-CM | POA: Insufficient documentation

## 2019-04-26 DIAGNOSIS — R7989 Other specified abnormal findings of blood chemistry: Secondary | ICD-10-CM

## 2019-04-26 DIAGNOSIS — D696 Thrombocytopenia, unspecified: Secondary | ICD-10-CM | POA: Diagnosis not present

## 2019-04-26 DIAGNOSIS — R945 Abnormal results of liver function studies: Secondary | ICD-10-CM

## 2019-04-26 LAB — CMP (CANCER CENTER ONLY)
ALT: 116 U/L — ABNORMAL HIGH (ref 0–44)
AST: 69 U/L — ABNORMAL HIGH (ref 15–41)
Albumin: 3 g/dL — ABNORMAL LOW (ref 3.5–5.0)
Alkaline Phosphatase: 486 U/L — ABNORMAL HIGH (ref 38–126)
Anion gap: 8 (ref 5–15)
BUN: 14 mg/dL (ref 8–23)
CO2: 28 mmol/L (ref 22–32)
Calcium: 8.7 mg/dL — ABNORMAL LOW (ref 8.9–10.3)
Chloride: 103 mmol/L (ref 98–111)
Creatinine: 0.72 mg/dL (ref 0.44–1.00)
GFR, Est AFR Am: >60 mL/min (ref >60)
GFR, Estimated: >60 mL/min (ref >60)
Glucose, Bld: 191 mg/dL — ABNORMAL HIGH (ref 70–99)
Potassium: 3.8 mmol/L (ref 3.5–5.1)
Sodium: 139 mmol/L (ref 135–145)
Total Bilirubin: 0.6 mg/dL (ref 0.3–1.2)
Total Protein: 6.3 g/dL — ABNORMAL LOW (ref 6.5–8.1)

## 2019-04-26 LAB — CBC WITH DIFFERENTIAL/PLATELET
Abs Immature Granulocytes: 0.1 10*3/uL — ABNORMAL HIGH (ref 0.00–0.07)
Basophils Absolute: 0.1 10*3/uL (ref 0.0–0.1)
Basophils Relative: 1 %
Eosinophils Absolute: 0.1 10*3/uL (ref 0.0–0.5)
Eosinophils Relative: 1 %
HCT: 37.9 % (ref 36.0–46.0)
Hemoglobin: 12.1 g/dL (ref 12.0–15.0)
Immature Granulocytes: 1 %
Lymphocytes Relative: 14 %
Lymphs Abs: 1.4 10*3/uL (ref 0.7–4.0)
MCH: 29.2 pg (ref 26.0–34.0)
MCHC: 31.9 g/dL (ref 30.0–36.0)
MCV: 91.3 fL (ref 80.0–100.0)
Monocytes Absolute: 0.6 10*3/uL (ref 0.1–1.0)
Monocytes Relative: 6 %
Neutro Abs: 7.5 10*3/uL (ref 1.7–7.7)
Neutrophils Relative %: 77 %
Platelets: 75 10*3/uL — ABNORMAL LOW (ref 150–400)
RBC: 4.15 MIL/uL (ref 3.87–5.11)
RDW: 20.6 % — ABNORMAL HIGH (ref 11.5–15.5)
WBC: 9.7 10*3/uL (ref 4.0–10.5)
nRBC: 0.8 % — ABNORMAL HIGH (ref 0.0–0.2)

## 2019-04-26 NOTE — Telephone Encounter (Signed)
Per 6/4 los, appts already scheduled. °

## 2019-04-26 NOTE — Telephone Encounter (Signed)
Returned faxed MD signed documentation from Dr. Irene Limbo for compression item for patient. Faxed attn Romeo Rabon @ [phone]. Fax confirmation received.

## 2019-04-27 ENCOUNTER — Ambulatory Visit: Payer: BC Managed Care – PPO | Admitting: Rehabilitation

## 2019-04-27 ENCOUNTER — Encounter: Payer: Self-pay | Admitting: Rehabilitation

## 2019-04-27 DIAGNOSIS — I890 Lymphedema, not elsewhere classified: Secondary | ICD-10-CM

## 2019-04-27 DIAGNOSIS — R2689 Other abnormalities of gait and mobility: Secondary | ICD-10-CM

## 2019-04-27 NOTE — Therapy (Signed)
[place], [place], [address] Phone: [phone]   Fax:  [phone]  Physical Therapy Treatment  Patient Details  Name: Stacey Cox MRN: [id number] Date of Birth: [birth year] Referring Provider (PT): Dr. Sullivan Lone   Encounter Date: 04/27/2019  PT End of Session - 04/27/19 1203    Visit Number  8    Number of Visits  13    Date for PT Re-Evaluation  05/11/19    PT Start Time  0045    PT Stop Time  1054    PT Time Calculation (min)  52 min    Activity Tolerance  Patient tolerated treatment well    Behavior During Therapy  [facility] for tasks assessed/performed       Past Medical History:  Diagnosis Date  . Cancer [facility])    Metastatic Breast Cancer    Past Surgical History:  Procedure Laterality Date  . CHOLECYSTECTOMY    . IR IMAGING GUIDED PORT INSERTION  07/14/2018  . TONSILLECTOMY      There were no vitals filed for this visit.  Subjective Assessment - 04/27/19 1201    Subjective  I left my bandages on the whole time because ididn't like how my forearm was larger     Pertinent History  Back pain led to diagnosis of left breast cancer 01/20/17. 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 Multifocal tumor in the left breast with biopsy-proven left axillary metastases and metastases in bones and lung.   Has hd chemotherapy and radiation to her low back, 10 treatments  which helped with the pain. No other health issues. 04/10/19: She is currently undergoing chemotherapy with once a month infusions. she has completed brain radiation and has mets to liver and widespread sclerotic bone mets     Patient Stated Goals  to get lymphedema under control     Currently in Pain?  No/denies                       [facility] Adult PT Treatment/Exercise - 04/27/19 0001      Manual Therapy   Edema Management  gave pt handout on how to access klose videos    Manual Lymphatic  Drainage (MLD)  In Supine: Short neck, superficial and deep abdominals, Lt inguinal and Rt axillary nodes, Lt axillo-inguinal and anterior inter-axillary anastomosis, then Lt UE from lateral upper arm to dorsal hand working from proximal to distal then retracing all steps.     Compression Bandaging  Thick massage cream applied to arm, then thick stockinette, elastomull to fingers 1-4 (fixated with paper tape) with 1/4" gray foam to dorsal hand/knuckles up to superior to wrist, added another piece of 1/4" gray foam to the other side of the forearm due to most fibrosis here during MLD, one artiflex, 1- 6, 1-10, 1-12 cm short stretch bandages from hand to axilla                  PT Long Term Goals - 04/27/19 1205      PT LONG TERM GOAL #1   Title  Pt will haver reduction of circumference of left arm at 10 cm proximal to ulnar styloid by 3 cm     Status  On-going      PT LONG TERM GOAL #2   Title  Pt will be independent in management of lymphedema at home with self manual lymph draiange and use of compression garments  Status  Partially Met      PT LONG TERM GOAL #3   Title  Pt will be independent in conservative managment of lymphedema with elevation and exercise     Status  Partially Met            Plan - 04/27/19 1203    Clinical Impression Statement  Added new piece of foam to the underside of the forearm due to most fibrosis here during MLD.  Pts garments have been ordered so we will focus next session on teaching MLD.  has not yet been educated on this.     PT Next Visit Plan  focus on teaching self MLD (pt given how to access klose videos last visit) and apply compression bandages. Cont with complete decongestive therapy until garments arrive.     Consulted and Agree with Plan of Care  Patient       Patient will benefit from skilled therapeutic intervention in order to improve the following deficits and impairments:     Visit Diagnosis: Lymphedema of left  arm  Other abnormalities of gait and mobility     Problem List Patient Active Problem List   Diagnosis Date Noted  . Palliative care by specialist   . DNR (do not resuscitate)   . Expressive aphasia   . Brain metastasis ([place]) 01/27/2019  . Seizures ([place]) 07/21/2018  . Macrocytic anemia 07/21/2018  . Thrombocytopenia ([place]) 07/21/2018  . Mild renal insufficiency 07/21/2018  . Anxiety 07/21/2018  . Brain metastases ([place])   . Malignant neoplasm of female breast ([place])   . Breast cancer metastasized to brain ([place]) 07/20/2018  . Port-A-Cath in place 07/17/2018  . Liver metastases ([place]) 07/12/2018  . Genetic testing 08/08/2017  . Palliative care status   . Counseling regarding advance care planning and goals of care 03/25/2017  . 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 Nausea without vomiting 03/21/2017  . Bone metastases ([place]) 02/01/2017  . Osseous metastasis ([place])   . Malignant neoplasm metastatic to lung ([place])   . Cancer associated pain   . Carcinoma of breast metastatic to brain ([place]) 01/21/2017  . Pathologic fracture   . Breast mass, left    Shan Levans, PT 04/27/2019, 12:05 PM  [place], [place], [address] Phone: [phone]   Fax:  [phone]  Name: Stacey Cox MRN: [id number] Date of Birth: 10-02-1956

## 2019-04-27 NOTE — Patient Instructions (Signed)
How to access self MLD videos:  Web designer Videos:  Option 1:  [email or web address]  -Scroll down until you see "Self-MLD Upper Extremity"  -Watch the intro video until you feel comfortable with the material and then continue on to either the Right or the Left extremity   Option 2: [email or web address]-[email or web address]-dvd  (The videos are in the Details section of the page)  -OR-  1. Go to [email or web address]  2.  Scroll down until to you see:  Compression Guru Exclusive Web designer Lymphedema Management Videos  Watch Free   3. Click on "Watch Free"  4. Scroll down until you see the instructional videos  5. Start with the "Self MLD Intro" video  6.  To watch the next appropriate video you will have to make an account on the website. There is a video for Right Arm, Left Arm, Right Leg, and Left Leg

## 2019-04-30 ENCOUNTER — Other Ambulatory Visit: Payer: Self-pay

## 2019-04-30 ENCOUNTER — Encounter: Payer: Self-pay | Admitting: Physical Therapy

## 2019-04-30 ENCOUNTER — Ambulatory Visit: Payer: BC Managed Care – PPO | Admitting: Physical Therapy

## 2019-04-30 DIAGNOSIS — I890 Lymphedema, not elsewhere classified: Secondary | ICD-10-CM | POA: Diagnosis not present

## 2019-04-30 NOTE — Therapy (Signed)
[place], [place], [address] Phone: [phone]   Fax:  [phone]  Physical Therapy Treatment  Patient Details  Name: Stacey Cox MRN: [id number] Date of Birth: Apr 10, 1956 Referring Provider (PT): Dr. Sullivan Lone   Encounter Date: 04/30/2019  PT End of Session - 04/30/19 1431    Visit Number  9    Number of Visits  13    Date for PT Re-Evaluation  05/11/19    PT Start Time  1237    PT Stop Time  1325    PT Time Calculation (min)  48 min       Past Medical History:  Diagnosis Date  . Cancer [facility])    Metastatic Breast Cancer    Past Surgical History:  Procedure Laterality Date  . CHOLECYSTECTOMY    . IR IMAGING GUIDED PORT INSERTION  07/14/2018  . TONSILLECTOMY      There were no vitals filed for this visit.  Subjective Assessment - 04/30/19 1425    Subjective  Pt still wants to get her left arm smaller especially at the forearm.      Pertinent History  Back pain led to diagnosis of left breast cancer 01/20/17. 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 Multifocal tumor in the left breast with biopsy-proven left axillary metastases and metastases in bones and lung.   Has hd chemotherapy and radiation to her low back, 10 treatments  which helped with the pain. No other health issues. 04/10/19: She is currently undergoing chemotherapy with once a month infusions. she has completed brain radiation and has mets to liver and widespread sclerotic bone mets     Patient Stated Goals  to get lymphedema under control     Currently in Pain?  No/denies                       [facility] Adult PT Treatment/Exercise - 04/30/19 0001      Manual Therapy   Manual Therapy  Edema management;Manual Lymphatic Drainage (MLD);Compression Bandaging    Manual therapy comments  removed bandages and inspected skin...it looks good.  Pt washed her arm at the sink     Edema  Management  reniforced remedial exercises while in bandages and elevatton    Manual Lymphatic Drainage (MLD)  instructed pt while performed short neck, diaphragmatic breaths, right axillary nodes and anterior interaxillary anastamosis with hand over hand instruction. left inguinal nodes and left axillo-inguinal anastamosis. left shoulder to hand with return along pathways with extra time on forearm, then to right sidelying for posterior interaxillary anastamosis     Compression Bandaging  Thick massage cream applied to arm, then thick stockinette, elastomull to fingers 1-4 (fixated with paper tape)peach lined foam to both sides of forearm, 2  artiflex, 1- 6, 1-10,in herringbone and  2 -10 cm short stretch bandages from hand to axilla                  PT Long Term Goals - 04/27/19 1205      PT LONG TERM GOAL #1   Title  Pt will haver reduction of circumference of left arm at 10 cm proximal to ulnar styloid by 3 cm     Status  On-going      PT LONG TERM GOAL #2   Title  Pt will be independent in management of lymphedema at home with self manual lymph draiange and use of compression garments     Status  Partially Met      PT LONG TERM GOAL #3   Title  Pt will be independent in conservative managment of lymphedema with elevation and exercise     Status  Partially Met            Plan - 04/30/19 1432    Clinical Impression Statement  pt says she has not looked at the [employer] yet.  Began instruction in self MLD and reinforced elevation and exercise.  Upgraded peach lined foam to forearm and added another bandage in herringbone to try to reduce forearm further If pt does not get better reduction, may need to initiate compression pump     Comorbidities  metastatic cancer to bone, brain, liver,  axillary nodes     PT Treatment/Interventions  ADLs/Self Care Home Management;Therapeutic exercise;Therapeutic activities;Patient/family education;Orthotic Fit/Training;Manual  techniques;Manual lymph drainage;Compression bandaging;Passive range of motion;Taping;Vasopneumatic Device    PT Next Visit Plan  Remeasure to see if forearm reduced. F ocus on teaching self MLD (pt given how to access klose videos last visit) and apply compression bandages. Cont with complete decongestive therapy until garments arrive. Initiate pump if pt does not reduce well     Consulted and Agree with Plan of Care  Patient       Patient will benefit from skilled therapeutic intervention in order to improve the following deficits and impairments:  Decreased knowledge of use of DME, Postural dysfunction, Impaired UE functional use, Increased edema  Visit Diagnosis: Lymphedema of left arm     Problem List Patient Active Problem List   Diagnosis Date Noted  . Palliative care by specialist   . DNR (do not resuscitate)   . Expressive aphasia   . Brain metastasis ([place]) 01/27/2019  . Seizures ([place]) 07/21/2018  . Macrocytic anemia 07/21/2018  . Thrombocytopenia ([place]) 07/21/2018  . Mild renal insufficiency 07/21/2018  . Anxiety 07/21/2018  . Brain metastases ([place])   . Malignant neoplasm of female breast ([place])   . Breast cancer metastasized to brain ([place]) 07/20/2018  . Port-A-Cath in place 07/17/2018  . Liver metastases ([place]) 07/12/2018  . Genetic testing 08/08/2017  . Palliative care status   . Counseling regarding advance care planning and goals of care 03/25/2017  . 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 Nausea without vomiting 03/21/2017  . Bone metastases ([place]) 02/01/2017  . Osseous metastasis ([place])   . Malignant neoplasm metastatic to lung ([place])   . Cancer associated pain   . Carcinoma of breast metastatic to brain ([place]) 01/21/2017  . Pathologic fracture   . Breast mass, left    Donato Heinz. Owens Shark PT  Norwood Levo 04/30/2019, 2:36 PM  [place], [place], [address] Phone:  [phone]   Fax:  [phone]  Name: Stacey Cox MRN: [id number] Date of Birth: February 26, 1956

## 2019-05-02 ENCOUNTER — Ambulatory Visit: Payer: BC Managed Care – PPO | Admitting: Physical Therapy

## 2019-05-02 ENCOUNTER — Other Ambulatory Visit: Payer: Self-pay

## 2019-05-02 ENCOUNTER — Encounter: Payer: Self-pay | Admitting: Physical Therapy

## 2019-05-02 DIAGNOSIS — I890 Lymphedema, not elsewhere classified: Secondary | ICD-10-CM | POA: Diagnosis not present

## 2019-05-02 NOTE — Progress Notes (Signed)
[name]    HEMATOLOGY/ONCOLOGY CLINIC NOTE  Date of Service: 05/03/19    Patient Care Team: Patient, No Pcp Per as PCP - General (General Practice)  CHIEF COMPLAINTS/PURPOSE OF CONSULTATION:   F/u for metastatic ER/PR +ve, Her2 neg breast cancer  HISTORY OF PRESENTING ILLNESS:   plz see previous notes for details.  INTERVAL HISTORY   Stacey Cox is here for follow-up of her metastatic hormone positive HER-2 negative breast cancer. The patient's last visit with [place] was on 04/26/19. The pt reports that she is doing well overall.  The pt reports that she will be following up with her dentist in four days. She denies any dental pain at this time nor concerns for bleeding. She denies pain or tenderness along the jaw.  The pt notes that her left arm lymphedema has improved with wrapping at the lymphedema clinic.   She denies having developed any other new concerns and denies abdominal pains or leg swelling.  Lab results today (05/03/19) of CBC w/diff and CMP is as follows: all values are WNL except for RDW at 20.0, PLT at 149k, Abs immature granulocytes at 0.13k, Glucose at 230, Albumin at 3.1, AST at 51, ALT at 99, Alk Phos at 443.  On review of systems, pt reports stable energy levels, and denies dental pain, concerns for bleeding, abdominal pains, tenderness along the jaw, leg swelling, and any other symptoms.    MEDICAL HISTORY:  Past Medical History:  Diagnosis Date  . Cancer [facility])    Metastatic Breast Cancer    SURGICAL HISTORY: Past Surgical History:  Procedure Laterality Date  . CHOLECYSTECTOMY    . IR IMAGING GUIDED PORT INSERTION  07/14/2018  . TONSILLECTOMY      SOCIAL HISTORY: Social History   Socioeconomic History  . Marital status: Single    Spouse name: Not on file  . Number of children: Not on file  . Years of education: Not on file  . Highest education level: Not on file  Occupational History  . Not on file  Social Needs  . Financial resource strain:  Not on file  . Food insecurity    Worry: Not on file    Inability: Not on file  . Transportation needs    Medical: Not on file    Non-medical: Not on file  Tobacco Use  . Smoking status: Current Every Day Smoker    Packs/day: 0.15    Types: Cigarettes    Last attempt to quit: 01/21/2017    Years since quitting: 2.2  . Smokeless tobacco: Never Used  Substance and Sexual Activity  . Alcohol use: No    Alcohol/week: 0.0 standard drinks  . Drug use: No  . Sexual activity: Never  Lifestyle  . Physical activity    Days per week: Not on file    Minutes per session: Not on file  . Stress: Not on file  Relationships  . Social Herbalist on phone: Not on file    Gets together: Not on file    Attends religious service: Not on file    Active member of club or organization: Not on file    Attends meetings of clubs or organizations: Not on file    Relationship status: Not on file  . Intimate partner violence    Fear of current or ex partner: No    Emotionally abused: No    Physically abused: No    Forced sexual activity: No  Other Topics Concern  .  Not on file  Social History Narrative   Lives alone. 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 Has phobia of the medical system.  Cigarette smoker 1/2 PPD for about 40 yrs Social alcohol use No drugs Professor at [employer] no children  FAMILY HISTORY: Family History  Problem Relation Age of Onset  . Cancer Mother        breast  . Cancer Sister        breast  Mother with h/o breast cancer in her 48's Sister with breast cancer in her 66ys and later was diagnosed with multiple myeloma. (Patient is not aware of any specific breast cancer mutations present)  ALLERGIES:  is allergic to thorazine [chlorpromazine] and ribociclib.  MEDICATIONS:  Current Outpatient Medications  Medication Sig Dispense Refill  . aspirin EC 81 MG tablet Take 1 tablet (81 mg total) by mouth daily.    . chlorhexidine (PERIDEX) 0.12 % solution Use as  directed 15 mLs in the mouth or throat 2 (two) times daily as needed (mouth rinse after vomiting). Swish and spit 473 mL 1  . dexamethasone (DECADRON) 1 MG tablet Take 1 tablet (1 mg total) by mouth daily. 30 tablet 2  . escitalopram (LEXAPRO) 20 MG tablet TAKE 1 TABLET(20 MG) BY MOUTH DAILY 30 tablet 0  . feeding supplement, ENSURE ENLIVE, (ENSURE ENLIVE) LIQD Take 237 mLs by mouth 2 (two) times daily between meals. (Patient taking differently: Take 237 mLs by mouth daily. ) 237 mL 12  . hydrOXYzine (ATARAX/VISTARIL) 10 MG tablet Take 10 mg by mouth 2 (two) times daily.    [name] levETIRAcetam (KEPPRA) 1000 MG tablet Take 1 tablet (1,000 mg total) by mouth 2 (two) times daily. 60 tablet 5  . pantoprazole (PROTONIX) 40 MG tablet TAKE 1 TABLET(40 MG) BY MOUTH DAILY 30 tablet 1  . Vitamin D, Ergocalciferol, (DRISDOL) 1.25 MG (50000 UT) CAPS capsule TAKE 1 CAPSULE BY MOUTH 3 TIMES WEEKLY 24 capsule 0   No current facility-administered medications for this visit.    Facility-Administered Medications Ordered in Other Visits  Medication Dose Route Frequency Provider Last Rate Last Dose  . denosumab (XGEVA) injection 120 mg  120 mg Subcutaneous Once [name], MD      . fulvestrant (FASLODEX) injection 500 mg  500 mg Intramuscular Once [name], MD        REVIEW OF SYSTEMS:    A 10+ POINT REVIEW OF SYSTEMS WAS OBTAINED including neurology, dermatology, psychiatry, cardiac, respiratory, lymph, extremities, GI, GU, Musculoskeletal, constitutional, breasts, reproductive, HEENT.  All pertinent positives are noted in the HPI.  All others are negative.   PHYSICAL EXAMINATION:  ECOG PERFORMANCE STATUS: 1 - Symptomatic but completely ambulatory  Vitals:   05/03/19 0859  BP: 127/82  Pulse: (!) 104  Resp: 18  Temp: 99.1 F (37.3 C)  SpO2: 95%   Filed Weights   05/03/19 0859  Weight: 173 lb 14.4 oz (78.9 kg)   Body mass index is 28.94 kg/m.  GENERAL:alert, in no acute distress  and comfortable SKIN: no acute rashes, no significant lesions EYES: conjunctiva are pink and non-injected, sclera anicteric OROPHARYNX: MMM, no exudates, no oropharyngeal erythema or ulceration NECK: supple, no JVD LYMPH:  no palpable lymphadenopathy in the cervical, axillary or inguinal regions LUNGS: clear to auscultation b/l with normal respiratory effort HEART: regular rate & rhythm ABDOMEN:  normoactive bowel sounds , non tender, not distended. No palpable hepatosplenomegaly.  Extremity: no pedal edema PSYCH: alert & oriented x 3 with fluent speech  NEURO: no focal motor/sensory deficits   LABORATORY DATA:  I have reviewed the data as listed  CBC Latest Ref Rng & Units 05/03/2019 04/26/2019 04/12/2019  WBC 4.0 - 10.5 K/uL 9.9 9.7 3.0(L)  Hemoglobin 12.0 - 15.0 g/dL 12.8 12.1 12.7  Hematocrit 36.0 - 46.0 % 39.3 37.9 38.2  Platelets 150 - 400 K/uL 149(L) 75(L) 198   CBC    Component Value Date/Time   WBC 9.9 05/03/2019 0823   RBC 4.35 05/03/2019 0823   HGB 12.8 05/03/2019 0823   HGB 12.7 04/12/2019 0901   HGB 13.9 11/24/2017 1340   HCT 39.3 05/03/2019 0823   HCT 42.0 11/24/2017 1340   PLT 149 (L) 05/03/2019 0823   PLT 198 04/12/2019 0901   PLT 212 11/24/2017 1340   MCV 90.3 05/03/2019 0823   MCV 97.0 11/24/2017 1340   MCH 29.4 05/03/2019 0823   MCHC 32.6 05/03/2019 0823   RDW 20.0 (H) 05/03/2019 0823   RDW 14.1 11/24/2017 1340   LYMPHSABS 1.5 05/03/2019 0823   LYMPHSABS 1.4 11/24/2017 1340   MONOABS 0.8 05/03/2019 0823   MONOABS 0.3 11/24/2017 1340   EOSABS 0.2 05/03/2019 0823   EOSABS 0.1 11/24/2017 1340   BASOSABS 0.1 05/03/2019 0823   BASOSABS 0.1 11/24/2017 1340    . CMP Latest Ref Rng & Units 05/03/2019 04/26/2019 04/12/2019  Glucose 70 - 99 mg/dL 230(H) 191(H) 197(H)  BUN 8 - 23 mg/dL _0 Creatinine 0.44 - 1.00 mg/dL 0.76 0.72 0.82  Sodium 135 - 145 mmol/L 139 139 140  Potassium 3.5 - 5.1 mmol/L 3.5 3.8 3.4(L)  Chloride 98 - 111 mmol/L 102 103 103  CO2  22 - 32 mmol/L _1 Calcium 8.9 - 10.3 mg/dL 9.2 8.7(L) 9.6  Total Protein 6.5 - 8.1 g/dL 6.6 6.3(L) 6.4(L)  Total Bilirubin 0.3 - 1.2 mg/dL 0.5 0.6 0.4  Alkaline Phos 38 - 126 U/L 443(H) 486(H) 390(H)  AST 15 - 41 U/L 51(H) 69(H) 44(H)  ALT 0 - 44 U/L 99(H) 116(H) 76(H)   07/18/18 Pathology:          RADIOGRAPHIC STUDIES: I have personally reviewed the radiological images as listed and agreed with the findings in the report. No results found.  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 Multifocal tumor in the left breast with biopsy-proven left axillary metastases.   Noted to have extensive bone metastases and pulmonary metastases. Patient was noted to have calvarial metastases but no overt parenchymal metastasis 09/14/17 CT Chest/ad/pelvis Results discussed in details - good response.  02/22/18 CT revealed  Newly apparent 2.1 by 2.0 cm rim enhancing lesion in the right hepatic lobe suspicious for a metastatic lesion. This was not apparent on prior exams although the prior exams were all noncontrast and thus the lesion may have been present but with reduced conspicuity. There is also a small enhancing lesion further posteriorly in the right hepatic lobe which is technically nonspecific and could be a small hemangioma or a small metastatic lesion. Stable distribution and appearance of prior sclerotic osseous metastatic disease without bony progression. 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   07/06/18 CT C/A/P which revealed New pattern of heterogeneous enhancement and enhancing ill-defined lesions in the central LEFT and RIGHT hepatic lobe at site prior enhancing lesion  is highly concerning for progression of of infiltrative malignancy in the liver occupying a large portion of the RIGHT hepatic  lobe and central LEFT hepatic lobe. 2. Small amount of free fluid in the abdomen pelvis. 3. No metastatic adenopathy.4. No evidence pulmonary metastasis or lymphadenopathy. 5. Stable dense sclerotic skeletal metastasis   07/11/18 ECHO revealed LV EF of 55%-60%   07/18/18 Liver needle/core biopsy right lesion consi with Ki67 at 40%, ER/PR +ve   07/28/18 MRI Abdomen MRCP revealed Extensive hepatic metastatic disease, possibly progressive from recent abdominal CT. 2. No evidence of intrahepatic or extrahepatic biliary dilatation. Elevated liver function studies may be secondary to intrahepatic cholestasis. 3. Increased ascites without peritoneal nodularity or suspicious enhancement. 4. Grossly stable widespread osseous metastatic disease.   S/p 6 cycles of AC completed on 11/01/18  03/20/19 CT C/A/P revealed "Although the dominant liver lesion appears improved in the interval, there appears to be 2 new liver metastases on today's exam, concerning for disease progression. 2. Similar appearance of the widespread sclerotic bone metastases. 3. Interval decrease in ascites. 4. Aortic Atherosclerois."  Pt's planned third weekly infusion of Taxol was cancelled due to dental abscess s/p antibiotic course. Then cancelled again due to thrombocytopenia thought to be due to antibiotic course.  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 Much improved back pain.  # 3 Neutropenia Related to her Ribociclib - resolved. Patient is currently on Verzenio and has not developed any neutropenia. This is being monitored.  #4 Increased LFTs due to new liver metastases  #5 Cancer related pain - controlled - status post palliative RT to spine - no currently needing any pain medications.  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 No other new medication. Now resolved. Monitoring on increasing doses of Verzenio. No issues with recurrent rash on Verzenio 149m po BID  #7 Brain metastasis  07/20/18  CT Head revealed 1.8 x 2.4 x 3.9 cm LEFT frontal cortical based mass with possible dural component highly concerning for metastatic disease, less likely abscess or cerebritis. Severe vasogenic edema with 5 mm LEFT to RIGHT subfalcine herniation. No ventricular entrapment. 2. 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 3. Multiple calvarial metastasis. 4. Cerebellar atrophy.    07/21/18 MRI Brain revealed  Irregular plaque-like mass, likely metastasis, measuring up to 5.1 cm, appears to originate from the left anterolateral frontal dura with invasion of the underlying frontal lobe of the brain. Mass effect from brain edema and the lesion partially effaces the frontal horn of left lateral ventricle and results in 7 mm left-to-right midline shift of anterior septum pellucidum. 2. Diffuse dural thickening over the left cerebral convexity may represent edema associated with the tumor or invasive neoplasm. 3. 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   07/28/18 MRI Brain revealed Extensive nodular dural enhancement left frontal lobe with associated enhancing mass growing in the left frontal lobe is unchanged in size and compatible with metastatic disease. This is associated with metastatic disease to the left frontal bone. Extensive edema in the left frontal lobe has progressed in the interval. Blastic metastatic disease throughout the calvarium. Metastatic disease in the cervical spine. Enhancing mass along the floor of the posterior fossa on the right is stable and most consistent with meningioma. 6 mm enhancing mass in the prepontine cistern on the right also unchanged and compatible with meningioma.   s/p SRS radiation therapy  10/23/18 MRI Brain  revealed SRS protocol demonstrating treatment response: Regression of LEFT frontal dural metastasis, decreased vasogenic edema and parenchymal invasion. Similar calvarial metastasis. 2.  Stable appearance of 2 small posterior fossa meningiomas. 3. No acute intracranial process.   02/02/19 MRI Brain revealed "Marked progression of dural disease left greater than right as described above. Pronounced brain edema, left more than right with left-to-right shift of 1 cm. 2. I do not identify any brain parenchymal metastases. 3. Small stable incidental meningiomas in the posterior fossa as above."  Pt is s/p SRS for dural metastases.  PLAN:  -Continue follow up with Dr. Mickeal Skinner in Neuro-oncology on 02/06/2019 and Dr. Tammi Klippel in [place] with Delton See every 4 weeks   #8 Vitamin D deficiency Vit D levels improved to 44.8  On 12/01/2018 -continue Vit D replacement.  #9 h/o Left upper extremity swelling- [place] neg for DVT - now swelling resolved.  PLAN:  -Discussed pt labwork today, 05/03/19; blood counts are improved with PLT at 149k. Liver functions improved.  -The pt has no prohibitive toxicities from continuing C2D1 Taxol at this time. -Holding Xgeva due to recent dental concerns -Continue care with outpatient lymphedema PT clinic for left arm lymphedema next week on 04/10/19. -Patient to continue with Art Therapy and outpatient PT -Continue Keppra -Continue tapering dexamethasone as per Dr Mickeal Skinner - currently on 65m daily -Continue 20MEQ Potassium PO BID replacement  -Continue Ergocalciferol 50k units, three times a week, with fatty foods to aide absorption -Continue Faslodex every 4 weeks -Will see the pt back in 2 weeks   Please schedule remaining C2 and C3 of taxol  RTC with Dr KIrene [id number] in 2 weeks   All of the patients questions were answered with apparent satisfaction. The patient knows to call the clinic with any problems, questions or concerns.   The total time spent in the appt was 25 minutes and more than 50% was on counseling and direct patient cares.    GSullivan LoneMD MCharlestownAAHIVMS SLake Charles Memorial HospitalCFlushing Hospital Medical CenterHematology/Oncology Physician CSelect [facility] (Office):        [phone](Work cell):  [phone](Fax):           [phone] I, SBaldwin [place] am acting as a scribe for Dr. GSullivan Lone   .I have reviewed the above documentation for accuracy and completeness, and I agree with the above. .Brunetta GeneraMD

## 2019-05-02 NOTE — Therapy (Signed)
[place], [place], [address] Phone: [phone]   Fax:  [phone]  Physical Therapy Treatment  Patient Details  Name: Stacey Cox MRN: [id number] Date of Birth: 09/22/1956 Referring Provider (PT): Dr. Sullivan Lone   Encounter Date: 05/02/2019  PT End of Session - 05/02/19 1634    Visit Number  10    Number of Visits  13    Date for PT Re-Evaluation  05/11/19    PT Start Time  4656    PT Stop Time  1430    PT Time Calculation (min)  56 min    Activity Tolerance  Patient tolerated treatment well    Behavior During Therapy  [facility] for tasks assessed/performed       Past Medical History:  Diagnosis Date  . Cancer [facility])    Metastatic Breast Cancer    Past Surgical History:  Procedure Laterality Date  . CHOLECYSTECTOMY    . IR IMAGING GUIDED PORT INSERTION  07/14/2018  . TONSILLECTOMY      There were no vitals filed for this visit.  Subjective Assessment - 05/02/19 1631    Subjective  Pt brings in her elvarex sleeve and glove that was delivered today.  She kept her bandages on since Monday     Pertinent History  Back pain led to diagnosis of left breast cancer 01/20/17. 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 Multifocal tumor in the left breast with biopsy-proven left axillary metastases and metastases in bones and lung.   Has hd chemotherapy and radiation to her low back, 10 treatments  which helped with the pain. No other health issues. 04/10/19: She is currently undergoing chemotherapy with once a month infusions. she has completed brain radiation and has mets to liver and widespread sclerotic bone mets     Patient Stated Goals  to get lymphedema under control     Currently in Pain?  No/denies            LYMPHEDEMA/ONCOLOGY QUESTIONNAIRE - 05/02/19 1338      Left Upper Extremity Lymphedema   10 cm Proximal to Olecranon Process  32 cm    Olecranon Process   27.7 cm    15 cm Proximal to Ulnar Styloid Process  27.8 cm    10 cm Proximal to Ulnar Styloid Process  26.5 cm    Just Proximal to Ulnar Styloid Process  19.2 cm    Across Hand at [employer]  18.8 cm    At [place] of 2nd Digit  6.4 cm                [facility] Adult PT Treatment/Exercise - 05/02/19 0001      Manual Therapy   Edema Management  remeasured arm and pt has some modest reduction, but her arm feels much softer.  Tried on her sleeve and glove and they fit well ( although the fingers are a little long in the glove ). Demonstrated the Medi butler should she want to get it to make donning the sleeve easier.  showed pt information about the Flexitouch and she wants me to send demographics     Manual Lymphatic Drainage (MLD)  Briefly, in Supine: Short neck, superficial and deep abdominals, Lt inguinal and Rt axillary nodes, Lt axillo-inguinal and anterior inter-axillary anastomosis, then Lt UE from lateral upper arm to dorsal hand working from proximal to distal then retracing all steps.     Compression Bandaging  Thick massage cream applied  to arm, then thick stockinette, elastomull to fingers 1-4 (fixated with paper tape)peach lined foam to both sides of forearm, 2  artiflex, 1- 6, 1-10,in herringbone and  2 -10 cm short stretch bandages from hand to axilla                  PT Long Term Goals - 04/27/19 1205      PT LONG TERM GOAL #1   Title  Pt will haver reduction of circumference of left arm at 10 cm proximal to ulnar styloid by 3 cm     Status  On-going      PT LONG TERM GOAL #2   Title  Pt will be independent in management of lymphedema at home with self manual lymph draiange and use of compression garments     Status  Partially Met      PT LONG TERM GOAL #3   Title  Pt will be independent in conservative managment of lymphedema with elevation and exercise     Status  Partially Met            Plan - 05/02/19 1636    Clinical Impression Statement  Pt  continues to improve and had some decrease and softening in left forearm as a response to increase in compression, but 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 Her daytime garments fit well . She is awaiting her nighttime garments.  Pt will need compression 24/7 and likely assistance from the Flexitouch compression pump to keep her lymphedema under control after discharge from PT.     Comorbidities  metastatic cancer to bone, brain, liver,  axillary nodes     PT Frequency  3x / week    PT Duration  4 weeks    PT Next Visit Plan  Focus on teaching self MLD ( see if pt has watched  klose videos )  and apply compression bandages. Cont with complete decongestive therapy until garments arrive.     Consulted and Agree with Plan of Care  Patient       Patient will benefit from skilled therapeutic intervention in order to improve the following deficits and impairments:  Decreased knowledge of use of DME, Postural dysfunction, Impaired UE functional use, Increased edema  Visit Diagnosis: Lymphedema of left arm     Problem List Patient Active Problem List   Diagnosis Date Noted  . Palliative care by specialist   . DNR (do not resuscitate)   . Expressive aphasia   . Brain metastasis ([place]) 01/27/2019  . Seizures ([place]) 07/21/2018  . Macrocytic anemia 07/21/2018  . Thrombocytopenia ([place]) 07/21/2018  . Mild renal insufficiency 07/21/2018  . Anxiety 07/21/2018  . Brain metastases ([place])   . Malignant neoplasm of female breast ([place])   . Breast cancer metastasized to brain ([place]) 07/20/2018  . Port-A-Cath in place 07/17/2018  . Liver metastases ([place]) 07/12/2018  . Genetic testing 08/08/2017  . Palliative care status   . Counseling regarding advance care planning and goals of care 03/25/2017  . 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 Nausea without vomiting 03/21/2017  . Bone metastases ([place]) 02/01/2017  . 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 Malignant neoplasm metastatic to lung ([place])   .  Cancer associated pain   . Carcinoma of breast metastatic to brain ([place]) 01/21/2017  . Pathologic fracture   . Breast mass, left    Donato Heinz. Owens Shark PT  Norwood Levo 05/02/2019, 4:41 PM  [place]  [place], [place], [address] Phone: [phone]   Fax:  [phone]  Name: Stacey Cox MRN: [id number] Date of Birth: 1956-08-12

## 2019-05-03 ENCOUNTER — Other Ambulatory Visit: Payer: Self-pay | Admitting: Hematology

## 2019-05-03 ENCOUNTER — Telehealth: Payer: Self-pay | Admitting: Hematology

## 2019-05-03 ENCOUNTER — Inpatient Hospital Stay: Payer: BC Managed Care – PPO

## 2019-05-03 ENCOUNTER — Other Ambulatory Visit: Payer: Self-pay

## 2019-05-03 ENCOUNTER — Inpatient Hospital Stay (HOSPITAL_BASED_OUTPATIENT_CLINIC_OR_DEPARTMENT_OTHER): Payer: BC Managed Care – PPO | Admitting: Hematology

## 2019-05-03 VITALS — BP 127/82 | HR 104 | Temp 99.1°F | Resp 18 | Ht 65.0 in | Wt 173.9 lb

## 2019-05-03 VITALS — HR 84

## 2019-05-03 DIAGNOSIS — C7931 Secondary malignant neoplasm of brain: Secondary | ICD-10-CM

## 2019-05-03 DIAGNOSIS — C50919 Malignant neoplasm of unspecified site of unspecified female breast: Secondary | ICD-10-CM

## 2019-05-03 DIAGNOSIS — E559 Vitamin D deficiency, unspecified: Secondary | ICD-10-CM

## 2019-05-03 DIAGNOSIS — C787 Secondary malignant neoplasm of liver and intrahepatic bile duct: Secondary | ICD-10-CM

## 2019-05-03 DIAGNOSIS — C50812 Malignant neoplasm of overlapping sites of left female breast: Secondary | ICD-10-CM

## 2019-05-03 DIAGNOSIS — I890 Lymphedema, not elsewhere classified: Secondary | ICD-10-CM

## 2019-05-03 DIAGNOSIS — C7951 Secondary malignant neoplasm of bone: Secondary | ICD-10-CM | POA: Diagnosis not present

## 2019-05-03 DIAGNOSIS — G893 Neoplasm related pain (acute) (chronic): Secondary | ICD-10-CM

## 2019-05-03 DIAGNOSIS — Z170 Estrogen receptor positive status [ER+]: Secondary | ICD-10-CM

## 2019-05-03 DIAGNOSIS — R7989 Other specified abnormal findings of blood chemistry: Secondary | ICD-10-CM

## 2019-05-03 DIAGNOSIS — C7800 Secondary malignant neoplasm of unspecified lung: Secondary | ICD-10-CM | POA: Diagnosis not present

## 2019-05-03 DIAGNOSIS — Z5111 Encounter for antineoplastic chemotherapy: Secondary | ICD-10-CM

## 2019-05-03 DIAGNOSIS — Z7189 Other specified counseling: Secondary | ICD-10-CM

## 2019-05-03 DIAGNOSIS — D696 Thrombocytopenia, unspecified: Secondary | ICD-10-CM

## 2019-05-03 DIAGNOSIS — R945 Abnormal results of liver function studies: Secondary | ICD-10-CM

## 2019-05-03 LAB — CBC WITH DIFFERENTIAL/PLATELET
Abs Immature Granulocytes: 0.13 10*3/uL — ABNORMAL HIGH (ref 0.00–0.07)
Basophils Absolute: 0.1 10*3/uL (ref 0.0–0.1)
Basophils Relative: 1 %
Eosinophils Absolute: 0.2 10*3/uL (ref 0.0–0.5)
Eosinophils Relative: 2 %
HCT: 39.3 % (ref 36.0–46.0)
Hemoglobin: 12.8 g/dL (ref 12.0–15.0)
Immature Granulocytes: 1 %
Lymphocytes Relative: 15 %
Lymphs Abs: 1.5 10*3/uL (ref 0.7–4.0)
MCH: 29.4 pg (ref 26.0–34.0)
MCHC: 32.6 g/dL (ref 30.0–36.0)
MCV: 90.3 fL (ref 80.0–100.0)
Monocytes Absolute: 0.8 10*3/uL (ref 0.1–1.0)
Monocytes Relative: 8 %
Neutro Abs: 7.2 10*3/uL (ref 1.7–7.7)
Neutrophils Relative %: 73 %
Platelets: 149 10*3/uL — ABNORMAL LOW (ref 150–400)
RBC: 4.35 MIL/uL (ref 3.87–5.11)
RDW: 20 % — ABNORMAL HIGH (ref 11.5–15.5)
WBC: 9.9 10*3/uL (ref 4.0–10.5)
nRBC: 0 % (ref 0.0–0.2)

## 2019-05-03 LAB — CMP (CANCER CENTER ONLY)
ALT: 99 U/L — ABNORMAL HIGH (ref 0–44)
AST: 51 U/L — ABNORMAL HIGH (ref 15–41)
Albumin: 3.1 g/dL — ABNORMAL LOW (ref 3.5–5.0)
Alkaline Phosphatase: 443 U/L — ABNORMAL HIGH (ref 38–126)
Anion gap: 11 (ref 5–15)
BUN: 14 mg/dL (ref 8–23)
CO2: 26 mmol/L (ref 22–32)
Calcium: 9.2 mg/dL (ref 8.9–10.3)
Chloride: 102 mmol/L (ref 98–111)
Creatinine: 0.76 mg/dL (ref 0.44–1.00)
GFR, Est AFR Am: >60 mL/min (ref >60)
GFR, Estimated: >60 mL/min (ref >60)
Glucose, Bld: 230 mg/dL — ABNORMAL HIGH (ref 70–99)
Potassium: 3.5 mmol/L (ref 3.5–5.1)
Sodium: 139 mmol/L (ref 135–145)
Total Bilirubin: 0.5 mg/dL (ref 0.3–1.2)
Total Protein: 6.6 g/dL (ref 6.5–8.1)

## 2019-05-03 MED ORDER — FAMOTIDINE IN NACL 20-0.9 MG/50ML-% IV SOLN
INTRAVENOUS | Status: AC
  Filled 2019-05-03: qty 50

## 2019-05-03 MED ORDER — HEPARIN SOD (PORK) LOCK FLUSH 100 UNIT/ML IV SOLN
500.0000 [IU] | Freq: Once | INTRAVENOUS | Status: AC | PRN
  Administered 2019-05-03: 500 [IU]
  Filled 2019-05-03: qty 5

## 2019-05-03 MED ORDER — FAMOTIDINE IN NACL 20-0.9 MG/50ML-% IV SOLN
20.0000 mg | Freq: Once | INTRAVENOUS | Status: AC
  Administered 2019-05-03: 20 mg via INTRAVENOUS

## 2019-05-03 MED ORDER — DIPHENHYDRAMINE HCL 50 MG/ML IJ SOLN
50.0000 mg | Freq: Once | INTRAMUSCULAR | Status: AC
  Administered 2019-05-03: 50 mg via INTRAVENOUS

## 2019-05-03 MED ORDER — SODIUM CHLORIDE 0.9 % IV SOLN
Freq: Once | INTRAVENOUS | Status: AC
  Administered 2019-05-03: 10:00:00 via INTRAVENOUS
  Filled 2019-05-03: qty 250

## 2019-05-03 MED ORDER — SODIUM CHLORIDE 0.9% FLUSH
10.0000 mL | INTRAVENOUS | Status: DC | PRN
  Administered 2019-05-03: 10 mL
  Filled 2019-05-03: qty 10

## 2019-05-03 MED ORDER — SODIUM CHLORIDE 0.9 % IV SOLN
Freq: Once | INTRAVENOUS | Status: AC
  Administered 2019-05-03: 11:00:00 via INTRAVENOUS
  Filled 2019-05-03: qty 6

## 2019-05-03 MED ORDER — DIPHENHYDRAMINE HCL 50 MG/ML IJ SOLN
INTRAMUSCULAR | Status: AC
  Filled 2019-05-03: qty 1

## 2019-05-03 MED ORDER — SODIUM CHLORIDE 0.9 % IV SOLN
80.0000 mg/m2 | Freq: Once | INTRAVENOUS | Status: AC
  Administered 2019-05-03: 150 mg via INTRAVENOUS
  Filled 2019-05-03: qty 25

## 2019-05-03 NOTE — Progress Notes (Signed)
Per. Dr. Irene Limbo - ok to treat with ALT 99 and CBG 230

## 2019-05-03 NOTE — Patient Instructions (Signed)
[facility] Discharge Instructions for Patients Receiving Chemotherapy  Today you received the following chemotherapy agents Paclitaxel (TAXOL).  To help prevent nausea and vomiting after your treatment, we encourage you to take your nausea medication as prescribed.  If you develop nausea and vomiting that is not controlled by your nausea medication, call the clinic.   BELOW ARE SYMPTOMS THAT SHOULD BE REPORTED IMMEDIATELY:  *FEVER GREATER THAN 100.5 F  *CHILLS WITH OR WITHOUT FEVER  NAUSEA AND VOMITING THAT IS NOT CONTROLLED WITH YOUR NAUSEA MEDICATION  *UNUSUAL SHORTNESS OF BREATH  *UNUSUAL BRUISING OR BLEEDING  TENDERNESS IN MOUTH AND THROAT WITH OR WITHOUT PRESENCE OF ULCERS  *URINARY PROBLEMS  *BOWEL PROBLEMS  UNUSUAL RASH Items with * indicate a potential emergency and should be followed up as soon as possible.  Feel free to call the clinic should you have any questions or concerns. The clinic phone number is [phone].  Please show the CHEMO ALERT CARD at check-in to the Emergency Department and triage nurse.  Coronavirus (COVID-19) Are you at risk?  Are you at risk for the Coronavirus (COVID-19)?  To be considered HIGH RISK for Coronavirus (COVID-19), you have to meet the following criteria:  . Traveled to [place], [place], [place], [place] or [place]; or in the [place] to [place], [place], [place], or [place]; and have fever, cough, and shortness of breath within the last 2 weeks of travel OR . Been in close contact with a person diagnosed with COVID-19 within the last 2 weeks and have fever, cough, and shortness of breath . IF YOU DO NOT MEET THESE CRITERIA, YOU ARE CONSIDERED LOW RISK FOR COVID-19.  What to do if you are HIGH RISK for COVID-19?  . If you are having a medical emergency, call 911. . Seek medical care right away. Before you go to a doctor's office, urgent care or emergency department, call ahead and tell them about  your recent travel, contact with someone diagnosed with COVID-19, and your symptoms. You should receive instructions from your physician's office regarding next steps of care.  . When you arrive at healthcare provider, tell the healthcare staff immediately you have returned from visiting [place], [place], [place], [place] or [place]; or traveled in the [place] to [place], [place], [place], or [place]; in the last two weeks or you have been in close contact with a person diagnosed with COVID-19 in the last 2 weeks.   . Tell the health care staff about your symptoms: fever, cough and shortness of breath. . After you have been seen by a medical provider, you will be either: o Tested for (COVID-19) and discharged home on quarantine except to seek medical care if symptoms worsen, and asked to  - Stay home and avoid contact with others until you get your results (4-5 days)  - Avoid travel on public transportation if possible (such as bus, train, or airplane) or o Sent to the Emergency Department by EMS for evaluation, COVID-19 testing, and possible admission depending on your condition and test results.  What to do if you are LOW RISK for COVID-19?  Reduce your risk of any infection by using the same precautions used for avoiding the common cold or flu:  . Wash your hands often with soap and warm water for at least 20 seconds.  If soap and water are not readily available, use an alcohol-based hand sanitizer with at least 60% alcohol.  . If coughing or sneezing,   cover your mouth and nose by coughing or sneezing into the elbow areas of your shirt or coat, into a tissue or into your sleeve (not your hands). . Avoid shaking hands with others and consider head nods or verbal greetings only. . Avoid touching your eyes, nose, or mouth with unwashed hands.  . Avoid close contact with people who are sick. . Avoid places or events with large numbers of people in one location, like concerts or sporting  events. . Carefully consider travel plans you have or are making. . If you are planning any travel outside or inside the [place], visit the CDC's Travelers' Health webpage for the latest health notices. . If you have some symptoms but not all symptoms, continue to monitor at home and seek medical attention if your symptoms worsen. . If you are having a medical emergency, call 911.   ADDITIONAL HEALTHCARE OPTIONS FOR PATIENTS  [facility] / e-Visit: [email or web address]         [facility]: [phone]  [facility]: [phone]                   [facility]: [phone]     

## 2019-05-03 NOTE — Telephone Encounter (Signed)
Scheduled appt per 6/11 los. Spoke with patient and patient of appt date and time.

## 2019-05-04 ENCOUNTER — Ambulatory Visit: Payer: BC Managed Care – PPO | Admitting: Physical Therapy

## 2019-05-04 ENCOUNTER — Encounter: Payer: Self-pay | Admitting: Physical Therapy

## 2019-05-04 DIAGNOSIS — I890 Lymphedema, not elsewhere classified: Secondary | ICD-10-CM

## 2019-05-04 NOTE — Therapy (Signed)
[place], [place], [address] Phone: [phone]   Fax:  [phone]  Physical Therapy Treatment  Patient Details  Name: Stacey Cox MRN: [id number] Date of Birth: [birth year] Referring Provider (PT): Dr. Sullivan Lone   Encounter Date: 05/04/2019  PT End of Session - 05/04/19 1123    Visit Number  11    Number of Visits  13    Date for PT Re-Evaluation  05/11/19    PT Start Time  1039   pt arrived late   PT Stop Time  1122    PT Time Calculation (min)  43 min    Activity Tolerance  Patient tolerated treatment well    Behavior During Therapy  [facility] for tasks assessed/performed       Past Medical History:  Diagnosis Date  . Cancer [facility])    Metastatic Breast Cancer    Past Surgical History:  Procedure Laterality Date  . CHOLECYSTECTOMY    . IR IMAGING GUIDED PORT INSERTION  07/14/2018  . TONSILLECTOMY      There were no vitals filed for this visit.  Subjective Assessment - 05/04/19 1040    Subjective  A person from [place] called me and they are still waiting on insurance.    Pertinent History  Back pain led to diagnosis of left breast cancer 01/20/17. 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 Multifocal tumor in the left breast with biopsy-proven left axillary metastases and metastases in bones and lung.   Has hd chemotherapy and radiation to her low back, 10 treatments  which helped with the pain. No other health issues. 04/10/19: She is currently undergoing chemotherapy with once a month infusions. she has completed brain radiation and has mets to liver and widespread sclerotic bone mets     Patient Stated Goals  to get lymphedema under control     Currently in Pain?  No/denies    Pain Score  0-No pain                       [facility] Adult PT Treatment/Exercise - 05/04/19 0001      Manual Therapy   Manual Lymphatic Drainage (MLD)  Briefly, in  Supine: Short neck, superficial and deep abdominals, Lt inguinal and Rt axillary nodes, Lt axillo-inguinal and anterior inter-axillary anastomosis, then Lt UE from lateral upper arm to dorsal hand working from proximal to distal then retracing all steps.     Compression Bandaging  lotion applied to arm, then thick stockinette, elastomull to fingers 1-4 (fixated with paper tape)peach lined foam to both sides of forearm, 2  artiflex, 1- 6, 1-10,in herringbone and  2 -10 cm short stretch bandages from hand to axilla                  PT Long Term Goals - 04/27/19 1205      PT LONG TERM GOAL #1   Title  Pt will haver reduction of circumference of left arm at 10 cm proximal to ulnar styloid by 3 cm     Status  On-going      PT LONG TERM GOAL #2   Title  Pt will be independent in management of lymphedema at home with self manual lymph draiange and use of compression garments     Status  Partially Met      PT LONG TERM GOAL #3   Title  Pt will be independent in conservative managment of lymphedema with elevation  and exercise     Status  Partially Met            Plan - 05/04/19 1123    Clinical Impression Statement  Continued with MLD to L UE and compression bandages. The foam has continued to work at pt's forearm and the area of very soft. She is awaiting arrival of night time garments. Once she receives those she will be instructed in self MLD.    PT Frequency  3x / week    PT Duration  4 weeks    PT Treatment/Interventions  ADLs/Self Care Home Management;Therapeutic exercise;Therapeutic activities;Patient/family education;Orthotic Fit/Training;Manual techniques;Manual lymph drainage;Compression bandaging;Passive range of motion;Taping;Vasopneumatic Device    PT Next Visit Plan  Focus on teaching self MLD ( see if pt has watched  klose videos )  and apply compression bandages. Cont with complete decongestive therapy until garments arrive.     Consulted and Agree with Plan of Care   Patient       Patient will benefit from skilled therapeutic intervention in order to improve the following deficits and impairments:  Decreased knowledge of use of DME, Postural dysfunction, Impaired UE functional use, Increased edema  Visit Diagnosis: Lymphedema of left arm     Problem List Patient Active Problem List   Diagnosis Date Noted  . Palliative care by specialist   . DNR (do not resuscitate)   . Expressive aphasia   . Brain metastasis ([place]) 01/27/2019  . Seizures ([place]) 07/21/2018  . Macrocytic anemia 07/21/2018  . Thrombocytopenia ([place]) 07/21/2018  . Mild renal insufficiency 07/21/2018  . Anxiety 07/21/2018  . Brain metastases ([place])   . Malignant neoplasm of female breast ([place])   . Breast cancer metastasized to brain ([place]) 07/20/2018  . Port-A-Cath in place 07/17/2018  . Liver metastases ([place]) 07/12/2018  . Genetic testing 08/08/2017  . Palliative care status   . Counseling regarding advance care planning and goals of care 03/25/2017  . 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 Nausea without vomiting 03/21/2017  . Bone metastases ([place]) 02/01/2017  . 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 Malignant neoplasm metastatic to lung ([place])   . Cancer associated pain   . Carcinoma of breast metastatic to brain ([place]) 01/21/2017  . Pathologic fracture   . Breast mass, left     Allyson Sabal [facility] 05/04/2019, 11:25 AM  [place], [place], [address] Phone: [phone]   Fax:  [phone]  Name: Stacey Cox MRN: [id number] Date of Birth: December 03, 1955  Manus Gunning, PT 05/04/19 11:25 AM

## 2019-05-07 ENCOUNTER — Encounter: Payer: Self-pay | Admitting: Physical Therapy

## 2019-05-07 ENCOUNTER — Other Ambulatory Visit: Payer: Self-pay

## 2019-05-07 ENCOUNTER — Ambulatory Visit: Payer: BC Managed Care – PPO | Admitting: Physical Therapy

## 2019-05-07 ENCOUNTER — Telehealth: Payer: Self-pay | Admitting: *Deleted

## 2019-05-07 DIAGNOSIS — I890 Lymphedema, not elsewhere classified: Secondary | ICD-10-CM

## 2019-05-07 NOTE — Telephone Encounter (Signed)
Patient called to report seizure occurrence since March 2020.  She states she was on the phone with sister who lives in [place] talking normally when suddenly she started mumbling and was unable to speak clearly for approximately 15 mins.  She had right sided trembling.  EMS arrived to assess VSS.    Upon further inquiry found patient has recently had delay by taking medication by approximately 1-2 hours off normal schedule.  Also reports her activity level that day was more than normal and she is also dealing with a gum infection.  Advised Dr. Mickeal Skinner to see if any medication changes should occur.  Current dosing Keppra is 1000/BID.  Routed to MD for further guidance.

## 2019-05-07 NOTE — Telephone Encounter (Signed)
Do not recommend increasing the Keppra unless overall frequency of seizures increases.  If more events occur this week we can recommend a dose alteration.  Ventura Sellers, MD

## 2019-05-07 NOTE — Therapy (Signed)
[place], [place], [address] Phone: [phone]   Fax:  [phone]  Physical Therapy Treatment  Patient Details  Name: Stacey Cox MRN: [id number] Date of Birth: January 30, 1956 Referring Provider (PT): Dr. Sullivan Lone   Encounter Date: 05/07/2019  PT End of Session - 05/07/19 1330    Visit Number  12    Number of Visits  13    Date for PT Re-Evaluation  05/11/19    PT Start Time  1230    PT Stop Time  1315    PT Time Calculation (min)  45 min    Activity Tolerance  Patient tolerated treatment well    Behavior During Therapy  [facility] for tasks assessed/performed       Past Medical History:  Diagnosis Date  . Cancer [facility])    Metastatic Breast Cancer    Past Surgical History:  Procedure Laterality Date  . CHOLECYSTECTOMY    . IR IMAGING GUIDED PORT INSERTION  07/14/2018  . TONSILLECTOMY      There were no vitals filed for this visit.  Subjective Assessment - 05/07/19 1234    Subjective  pt had to take her bandages off on Saturday.  She put on her daytime sleeve during the day but may not have put it on correctly.  She brings in her Lelan Pons which just arrive this morning Pt states she had seizure on Friday and thinks it was because she was not taking her medicine on the correct time schedule.    Pertinent History  Back pain led to diagnosis of left breast cancer 01/20/17. 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 Multifocal tumor in the left breast with biopsy-proven left axillary metastases and metastases in bones and lung.   Has hd chemotherapy and radiation to her low back, 10 treatments  which helped with the pain. No other health issues. 04/10/19: She is currently undergoing chemotherapy with once a month infusions. she has completed brain radiation and has mets to liver and widespread sclerotic bone mets     Currently in Pain?  No/denies                        [facility] Adult PT Treatment/Exercise - 05/07/19 0001      Manual Therapy   Edema Management  applied Tribute night, power sleeve and power compression jacket.  Had to call [employer] to take to sales rep about correct application. After this we were able to apply garmnet, but pt still wants to have it checked by Melissa the fitter to make sure all is correct and doesn't need alteration.  She also wants Melissa to  check the fit of her glove as the fingers are long.     Manual Lymphatic Drainage (MLD)  Briefly, in Supine: Short neck, superficial and deep abdominals, Lt inguinal and Rt axillary nodes, Lt axillo-inguinal and anterior inter-axillary anastomosis, then Lt UE from lateral upper arm to dorsal hand working from proximal to distal then retracing all steps.  then to supine for posterior interaxillay anastamosis,  Daytime sleeve and glove applied after session              PT Education - 05/07/19 1330    Education provided  Yes    Education Details  how to apply Tribute night garments, reviewed application of daytime sleeve    Person(s) Educated  Patient    Methods  Explanation;Demonstration    Comprehension  Verbalized understanding;Returned  demonstration          PT Long Term Goals - 04/27/19 1205      PT LONG TERM GOAL #1   Title  Pt will haver reduction of circumference of left arm at 10 cm proximal to ulnar styloid by 3 cm     Status  On-going      PT LONG TERM GOAL #2   Title  Pt will be independent in management of lymphedema at home with self manual lymph draiange and use of compression garments     Status  Partially Met      PT LONG TERM GOAL #3   Title  Pt will be independent in conservative managment of lymphedema with elevation and exercise     Status  Partially Met            Plan - 05/07/19 1331    Clinical Impression Statement  Pt had a difficult weekend with seizure and removal of bandages on Saturday.  Her arm appears  more swollen today.  We were able to get the Tribute night and power sleeve and jacket on, but pt needs to have fit checked by Lenna Sciara Will send her an email    Stability/Clinical Decision Making  Evolving/Moderate complexity    PT Frequency  3x / week    PT Treatment/Interventions  ADLs/Self Care Home Management;Therapeutic exercise;Therapeutic activities;Patient/family education;Orthotic Fit/Training;Manual techniques;Manual lymph drainage;Compression bandaging;Passive range of motion;Taping;Vasopneumatic Device    PT Next Visit Plan  Pt to have [employer], remeasure after that. check goals and send renewal  answer questions about garments and continue with MLD until flexi arrives.  No needs to continue with bandaging as pt now has day and night garments    Consulted and Agree with Plan of Care  Patient       Patient will benefit from skilled therapeutic intervention in order to improve the following deficits and impairments:     Visit Diagnosis: Lymphedema of left arm     Problem List Patient Active Problem List   Diagnosis Date Noted  . Palliative care by specialist   . DNR (do not resuscitate)   . Expressive aphasia   . Brain metastasis ([place]) 01/27/2019  . Seizures ([place]) 07/21/2018  . Macrocytic anemia 07/21/2018  . Thrombocytopenia ([place]) 07/21/2018  . Mild renal insufficiency 07/21/2018  . Anxiety 07/21/2018  . Brain metastases ([place])   . Malignant neoplasm of female breast ([place])   . Breast cancer metastasized to brain ([place]) 07/20/2018  . Port-A-Cath in place 07/17/2018  . Liver metastases ([place]) 07/12/2018  . Genetic testing 08/08/2017  . Palliative care status   . Counseling regarding advance care planning and goals of care 03/25/2017  . 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 Nausea without vomiting 03/21/2017  . Bone metastases ([place]) 02/01/2017  . 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 Malignant neoplasm metastatic to lung ([place])   . Cancer associated pain   .  Carcinoma of breast metastatic to brain ([place]) 01/21/2017  . Pathologic fracture   . Breast mass, left    Donato Heinz. Owens Shark PT  Norwood Levo 05/07/2019, 1:35 PM  [place], [place], [address] Phone: [phone]   Fax:  [phone]  Name: Stacey Cox MRN: [id number] Date of Birth: 10-May-1956

## 2019-05-08 ENCOUNTER — Telehealth: Payer: Self-pay | Admitting: *Deleted

## 2019-05-08 NOTE — Telephone Encounter (Signed)
Returned call to patient to advise no changes to Keppra medicine at this time.  Patient also questioned if she should continue to take the Decadron 1 mg daily.  Per Dr Mickeal Skinner patient should NOT stop taking this small daily dose.  Patient expressed understanding and denied any further questions.

## 2019-05-09 ENCOUNTER — Other Ambulatory Visit: Payer: Self-pay

## 2019-05-09 ENCOUNTER — Ambulatory Visit: Payer: BC Managed Care – PPO | Admitting: Physical Therapy

## 2019-05-09 ENCOUNTER — Encounter: Payer: Self-pay | Admitting: Physical Therapy

## 2019-05-09 ENCOUNTER — Telehealth: Payer: Self-pay | Admitting: *Deleted

## 2019-05-09 DIAGNOSIS — I890 Lymphedema, not elsewhere classified: Secondary | ICD-10-CM

## 2019-05-09 NOTE — Therapy (Signed)
[place], [place], [address] Phone: [phone]   Fax:  [phone]  Physical Therapy Treatment  Patient Details  Name: Stacey Cox MRN: [id number] Date of Birth: 09/21/1956 Referring Provider (PT): Dr. Sullivan Lone   Encounter Date: 05/09/2019  PT End of Session - 05/09/19 1328    Visit Number  13    Number of Visits  21    Date for PT Re-Evaluation  06/08/19    PT Start Time  6962    PT Stop Time  1315    PT Time Calculation (min)  40 min    Activity Tolerance  Patient tolerated treatment well    Behavior During Therapy  [facility] for tasks assessed/performed       Past Medical History:  Diagnosis Date  . Cancer [facility])    Metastatic Breast Cancer    Past Surgical History:  Procedure Laterality Date  . CHOLECYSTECTOMY    . IR IMAGING GUIDED PORT INSERTION  07/14/2018  . TONSILLECTOMY      There were no vitals filed for this visit.  Subjective Assessment - 05/09/19 1238    Subjective  Pt had her Flexi demonstration this morning.  She felt that is was effective and that she will be able to use it at home as part of lymphedema management self care plan    Pertinent History  Back pain led to diagnosis of left breast cancer 01/20/17. 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 Multifocal tumor in the left breast with biopsy-proven left axillary metastases and metastases in bones and lung.   Has hd chemotherapy and radiation to her low back, 10 treatments  which helped with the pain. No other health issues. 04/10/19: She is currently undergoing chemotherapy with once a month infusions. she has completed brain radiation and has mets to liver and widespread sclerotic bone mets     Currently in Pain?  No/denies         [facility] PT Assessment - 05/09/19 0001      Assessment   Medical Diagnosis  left breast cancer with metastases    Referring Provider (PT)  Dr. Sullivan Lone    Onset Date/Surgical Date  01/20/17      Prior Function   Level of Independence  Independent        LYMPHEDEMA/ONCOLOGY QUESTIONNAIRE - 05/09/19 1239      Left Upper Extremity Lymphedema   10 cm Proximal to Olecranon Process  33 cm    Olecranon Process  28 cm    15 cm Proximal to Ulnar Styloid Process  28.5 cm    10 cm Proximal to Ulnar Styloid Process  26.8 cm    Just Proximal to Ulnar Styloid Process  18.4 cm    Across Hand at [employer]  20 cm    At [place] of 2nd Digit  6.4 cm    Other  pt has not been bandaged: she has been wearing her nighttime Tribute and daytime flat knit sleeve and glove                 [facility] Adult PT Treatment/Exercise - 05/09/19 0001      Manual Therapy   Manual Lymphatic Drainage (MLD)   in Supine: Short neck, superficial and deep abdominals, Lt inguinal and Rt axillary nodes, Lt axillo-inguinal and anterior inter-axillary anastomosis, then Lt UE from lateral upper arm to dorsal hand working from proximal to distal then retracing all steps.  then to supine  for posterior interaxillay anastamosis,  Daytime sleeve and glove applied after session                   PT Long Term Goals - 05/09/19 1637      PT LONG TERM GOAL #1   Title  Pt will haver reduction of circumference of left arm at 10 cm proximal to ulnar styloid by 3 cm     Baseline  27 at baseline to 26.8 on 05/09/2019    Time  4    Period  Weeks    Status  On-going      PT LONG TERM GOAL #2   Title  Pt will be independent in management of lymphedema at home with self manual lymph draiange and use of compression garments     Baseline  pt now has garments and is awaiting flexitouch delivery 05/09/2019    Time  4    Period  Weeks    Status  On-going      PT LONG TERM GOAL #3   Title  Pt will be independent in conservative managment of lymphedema with elevation and exercise     Status  Achieved            Plan - 05/09/19 1329    Clinical Impression Statement  Pt  has increased congestion especailly in forearm today with decreased measurements at wrist (? if wrist is too tight at sleeve/glove??) pt will get remeasured and fit of current garments checked by fitter on Monday june 22.  She has increased measurements other than wrist with increased congestion so she definitely needs daily Flexitouch pump to manage at home.  She needs anothe flat knit sleeve and glove so that she can wear one while she is washing the other one.  She will need to continue MLD  here 2x per week until Flexi is delivered.  Recert sent today    Personal Factors and Comorbidities  Comorbidity 3+    Comorbidities  metastatic cancer to bone, brain, liver,  axillary nodes     Rehab Potential  Good    PT Frequency  2x / week    PT Duration  4 weeks    PT Treatment/Interventions  ADLs/Self Care Home Management;Therapeutic exercise;Therapeutic activities;Patient/family education;Orthotic Fit/Training;Manual techniques;Manual lymph drainage;Compression bandaging;Passive range of motion;Taping;Vasopneumatic Device    PT Next Visit Plan  continue with MLD until flexi arrives. and reapply garments after session  Continually reassess need to continue with bandaging as pt now has day and night garments    Consulted and Agree with Plan of Care  Patient       Patient will benefit from skilled therapeutic intervention in order to improve the following deficits and impairments:  Decreased knowledge of use of DME, Postural dysfunction, Impaired UE functional use, Increased edema  Visit Diagnosis: 1. Lymphedema of left arm        Problem List Patient Active Problem List   Diagnosis Date Noted  . Palliative care by specialist   . DNR (do not resuscitate)   . Expressive aphasia   . Brain metastasis ([place]) 01/27/2019  . Seizures ([place]) 07/21/2018  . Macrocytic anemia 07/21/2018  . Thrombocytopenia ([place]) 07/21/2018  . Mild renal insufficiency 07/21/2018  . Anxiety 07/21/2018  . Brain metastases  ([place])   . Malignant neoplasm of female breast ([place])   . Breast cancer metastasized to brain ([place]) 07/20/2018  . Port-A-Cath in place 07/17/2018  . Liver metastases ([place]) 07/12/2018  . Genetic testing 08/08/2017  .  Palliative care status   . Counseling regarding advance care planning and goals of care 03/25/2017  . 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 Nausea without vomiting 03/21/2017  . Bone metastases ([place]) 02/01/2017  . Osseous metastasis ([place])   . Malignant neoplasm metastatic to lung ([place])   . Cancer associated pain   . Carcinoma of breast metastatic to brain ([place]) 01/21/2017  . Pathologic fracture   . Breast mass, left    Donato Heinz. Owens Shark PT  [place] Levo 05/09/2019, 4:41 PM  [place], [place], [address] Phone: [phone]   Fax:  [phone]  Name: Stacey Cox MRN: [id number] Date of Birth: 11-17-1956

## 2019-05-09 NOTE — Telephone Encounter (Signed)
Faxed signed MD Certification for Pneumatic Compression Device for Lymphedema to [employer] @ [phone]. Fax confirmation received.

## 2019-05-10 ENCOUNTER — Other Ambulatory Visit: Payer: BC Managed Care – PPO

## 2019-05-10 ENCOUNTER — Other Ambulatory Visit: Payer: Self-pay

## 2019-05-10 ENCOUNTER — Inpatient Hospital Stay: Payer: BC Managed Care – PPO

## 2019-05-10 ENCOUNTER — Ambulatory Visit: Payer: BC Managed Care – PPO | Admitting: Hematology

## 2019-05-10 VITALS — BP 98/83 | HR 92 | Temp 98.9°F | Resp 20

## 2019-05-10 DIAGNOSIS — Z7189 Other specified counseling: Secondary | ICD-10-CM

## 2019-05-10 DIAGNOSIS — C50919 Malignant neoplasm of unspecified site of unspecified female breast: Secondary | ICD-10-CM

## 2019-05-10 DIAGNOSIS — C787 Secondary malignant neoplasm of liver and intrahepatic bile duct: Secondary | ICD-10-CM

## 2019-05-10 DIAGNOSIS — C7800 Secondary malignant neoplasm of unspecified lung: Secondary | ICD-10-CM

## 2019-05-10 DIAGNOSIS — Z95828 Presence of other vascular implants and grafts: Secondary | ICD-10-CM

## 2019-05-10 DIAGNOSIS — C7951 Secondary malignant neoplasm of bone: Secondary | ICD-10-CM

## 2019-05-10 DIAGNOSIS — C7931 Secondary malignant neoplasm of brain: Secondary | ICD-10-CM

## 2019-05-10 DIAGNOSIS — Z5111 Encounter for antineoplastic chemotherapy: Secondary | ICD-10-CM | POA: Diagnosis not present

## 2019-05-10 LAB — CMP (CANCER CENTER ONLY)
ALT: 109 U/L — ABNORMAL HIGH (ref 0–44)
AST: 55 U/L — ABNORMAL HIGH (ref 15–41)
Albumin: 3.2 g/dL — ABNORMAL LOW (ref 3.5–5.0)
Alkaline Phosphatase: 356 U/L — ABNORMAL HIGH (ref 38–126)
Anion gap: 10 (ref 5–15)
BUN: 13 mg/dL (ref 8–23)
CO2: 26 mmol/L (ref 22–32)
Calcium: 9.2 mg/dL (ref 8.9–10.3)
Chloride: 100 mmol/L (ref 98–111)
Creatinine: 0.76 mg/dL (ref 0.44–1.00)
GFR, Est AFR Am: >60 mL/min (ref >60)
GFR, Estimated: >60 mL/min (ref >60)
Glucose, Bld: 278 mg/dL — ABNORMAL HIGH (ref 70–99)
Potassium: 3.6 mmol/L (ref 3.5–5.1)
Sodium: 136 mmol/L (ref 135–145)
Total Bilirubin: 0.5 mg/dL (ref 0.3–1.2)
Total Protein: 6.5 g/dL (ref 6.5–8.1)

## 2019-05-10 LAB — CBC WITH DIFFERENTIAL/PLATELET
Abs Immature Granulocytes: 0.15 10*3/uL — ABNORMAL HIGH (ref 0.00–0.07)
Basophils Absolute: 0 10*3/uL (ref 0.0–0.1)
Basophils Relative: 1 %
Eosinophils Absolute: 0.1 10*3/uL (ref 0.0–0.5)
Eosinophils Relative: 2 %
HCT: 35.8 % — ABNORMAL LOW (ref 36.0–46.0)
Hemoglobin: 11.6 g/dL — ABNORMAL LOW (ref 12.0–15.0)
Immature Granulocytes: 3 %
Lymphocytes Relative: 15 %
Lymphs Abs: 0.8 10*3/uL (ref 0.7–4.0)
MCH: 29.5 pg (ref 26.0–34.0)
MCHC: 32.4 g/dL (ref 30.0–36.0)
MCV: 91.1 fL (ref 80.0–100.0)
Monocytes Absolute: 0.5 10*3/uL (ref 0.1–1.0)
Monocytes Relative: 9 %
Neutro Abs: 3.9 10*3/uL (ref 1.7–7.7)
Neutrophils Relative %: 70 %
Platelets: 168 10*3/uL (ref 150–400)
RBC: 3.93 MIL/uL (ref 3.87–5.11)
RDW: 18.7 % — ABNORMAL HIGH (ref 11.5–15.5)
WBC: 5.5 10*3/uL (ref 4.0–10.5)
nRBC: 0.9 % — ABNORMAL HIGH (ref 0.0–0.2)

## 2019-05-10 MED ORDER — HEPARIN SOD (PORK) LOCK FLUSH 100 UNIT/ML IV SOLN
500.0000 [IU] | Freq: Once | INTRAVENOUS | Status: AC | PRN
  Administered 2019-05-10: 16:00:00 500 [IU]
  Filled 2019-05-10: qty 5

## 2019-05-10 MED ORDER — FULVESTRANT 250 MG/5ML IM SOLN
500.0000 mg | Freq: Once | INTRAMUSCULAR | Status: AC
  Administered 2019-05-10: 500 mg via INTRAMUSCULAR

## 2019-05-10 MED ORDER — SODIUM CHLORIDE 0.9% FLUSH
10.0000 mL | INTRAVENOUS | Status: DC | PRN
  Administered 2019-05-10: 10 mL
  Filled 2019-05-10: qty 10

## 2019-05-10 MED ORDER — SODIUM CHLORIDE 0.9 % IV SOLN
80.0000 mg/m2 | Freq: Once | INTRAVENOUS | Status: AC
  Administered 2019-05-10: 150 mg via INTRAVENOUS
  Filled 2019-05-10: qty 25

## 2019-05-10 MED ORDER — FULVESTRANT 250 MG/5ML IM SOLN
INTRAMUSCULAR | Status: AC
  Filled 2019-05-10: qty 10

## 2019-05-10 MED ORDER — FAMOTIDINE IN NACL 20-0.9 MG/50ML-% IV SOLN
20.0000 mg | Freq: Once | INTRAVENOUS | Status: AC
  Administered 2019-05-10: 13:00:00 20 mg via INTRAVENOUS

## 2019-05-10 MED ORDER — SODIUM CHLORIDE 0.9 % IV SOLN
Freq: Once | INTRAVENOUS | Status: AC
  Administered 2019-05-10: 13:00:00 via INTRAVENOUS
  Filled 2019-05-10: qty 250

## 2019-05-10 MED ORDER — DIPHENHYDRAMINE HCL 50 MG/ML IJ SOLN
INTRAMUSCULAR | Status: AC
  Filled 2019-05-10: qty 1

## 2019-05-10 MED ORDER — DIPHENHYDRAMINE HCL 50 MG/ML IJ SOLN
50.0000 mg | Freq: Once | INTRAMUSCULAR | Status: AC
  Administered 2019-05-10: 50 mg via INTRAVENOUS

## 2019-05-10 MED ORDER — SODIUM CHLORIDE 0.9% FLUSH
10.0000 mL | Freq: Once | INTRAVENOUS | Status: AC
  Administered 2019-05-10: 12:00:00 10 mL
  Filled 2019-05-10: qty 10

## 2019-05-10 MED ORDER — SODIUM CHLORIDE 0.9 % IV SOLN
Freq: Once | INTRAVENOUS | Status: AC
  Administered 2019-05-10: 14:00:00 via INTRAVENOUS
  Filled 2019-05-10: qty 6

## 2019-05-10 MED ORDER — FAMOTIDINE IN NACL 20-0.9 MG/50ML-% IV SOLN
INTRAVENOUS | Status: AC
  Filled 2019-05-10: qty 50

## 2019-05-10 NOTE — Patient Instructions (Signed)
[facility] [facility] Discharge Instructions for Patients Receiving Chemotherapy  Today you received the following chemotherapy agents Paclitaxel (TAXOL).  To help prevent nausea and vomiting after your treatment, we encourage you to take your nausea medication as prescribed.   If you develop nausea and vomiting that is not controlled by your nausea medication, call the clinic.   BELOW ARE SYMPTOMS THAT SHOULD BE REPORTED IMMEDIATELY:  *FEVER GREATER THAN 100.5 F  *CHILLS WITH OR WITHOUT FEVER  NAUSEA AND VOMITING THAT IS NOT CONTROLLED WITH YOUR NAUSEA MEDICATION  *UNUSUAL SHORTNESS OF BREATH  *UNUSUAL BRUISING OR BLEEDING  TENDERNESS IN MOUTH AND THROAT WITH OR WITHOUT PRESENCE OF ULCERS  *URINARY PROBLEMS  *BOWEL PROBLEMS  UNUSUAL RASH Items with * indicate a potential emergency and should be followed up as soon as possible.  Feel free to call the clinic should you have any questions or concerns. The clinic phone number is [phone].  Please show the [place] at check-in to the Emergency Department and triage nurse.  Coronavirus (COVID-19) Are you at risk?  Are you at risk for the Coronavirus (COVID-19)?  To be considered HIGH RISK for Coronavirus (COVID-19), you have to meet the following criteria:  . Traveled to [place], [place], [place], [place] or [place]; or in the [place] to [place], [place], [place], or [place]; and have fever, cough, and shortness of breath within the last 2 weeks of travel OR . Been in close contact with a person diagnosed with COVID-19 within the last 2 weeks and have fever, cough, and shortness of breath . IF YOU DO NOT MEET THESE CRITERIA, YOU ARE CONSIDERED LOW RISK FOR COVID-19.  What to do if you are HIGH RISK for COVID-19?  [name] If you are having a medical emergency, call 911. . Seek medical care right away. Before you go to a doctor's office, urgent care or emergency department, call ahead and tell them  about your recent travel, contact with someone diagnosed with COVID-19, and your symptoms. You should receive instructions from your physician's office regarding next steps of care.  . When you arrive at healthcare provider, tell the healthcare staff immediately you have returned from visiting [place], [place], [place], [place] or [place]; or traveled in the [place] to [place], [place], [place], or [place]; in the last two weeks or you have been in close contact with a person diagnosed with COVID-19 in the last 2 weeks.   . Tell the health care staff about your symptoms: fever, cough and shortness of breath. . After you have been seen by a medical provider, you will be either: o Tested for (COVID-19) and discharged home on quarantine except to seek medical care if symptoms worsen, and asked to  - Stay home and avoid contact with others until you get your results (4-5 days)  - Avoid travel on public transportation if possible (such as bus, train, or airplane) or o Sent to the Emergency Department by EMS for evaluation, COVID-19 testing, and possible admission depending on your condition and test results.  What to do if you are LOW RISK for COVID-19?  Reduce your risk of any infection by using the same precautions used for avoiding the common cold or flu:  [name] Wash your hands often with soap and warm water for at least 20 seconds.  If soap and water are not readily available, use an alcohol-based hand sanitizer with at least 60% alcohol.  . If coughing or  sneezing, cover your mouth and nose by coughing or sneezing into the elbow areas of your shirt or coat, into a tissue or into your sleeve (not your hands). . Avoid shaking hands with others and consider head nods or verbal greetings only. . Avoid touching your eyes, nose, or mouth with unwashed hands.  . Avoid close contact with people who are sick. . Avoid places or events with large numbers of people in one location, like concerts or  sporting events. . Carefully consider travel plans you have or are making. . If you are planning any travel outside or inside the [place], visit the CDC's Travelers' Health webpage for the latest health notices. . If you have some symptoms but not all symptoms, continue to monitor at home and seek medical attention if your symptoms worsen. . If you are having a medical emergency, call 911.   [place] / e-Visit: [email or web address]         [facility]: [facility]: [phone]                   [facility]: [phone]    Fulvestrant injection What is this medicine? FULVESTRANT (ful VES trant) blocks the effects of estrogen. It is used to treat breast cancer. This medicine may be used for other purposes; ask your health care provider or pharmacist if you have questions. COMMON BRAND NAME(S): FASLODEX What should I tell my health care provider before I take this medicine? They need to know if you have any of these conditions: -bleeding disorders -liver disease -low blood counts, like low white cell, platelet, or red cell counts -an unusual or allergic reaction to fulvestrant, other medicines, foods, dyes, or preservatives -pregnant or trying to get pregnant -breast-feeding How should I use this medicine? This medicine is for injection into a muscle. It is usually given by a health care professional in a hospital or clinic setting. Talk to your pediatrician regarding the use of this medicine in children. Special care may be needed. Overdosage: If you think you have taken too much of this medicine contact a poison control center or emergency room at once. NOTE: This medicine is only for you. Do not share this medicine with others. What if I miss a dose? It is important not to miss your dose. Call your doctor or health care professional  if you are unable to keep an appointment. What may interact with this medicine? -medicines that treat or prevent blood clots like warfarin, enoxaparin, dalteparin, apixaban, dabigatran, and rivaroxaban This list may not describe all possible interactions. Give your health care provider a list of all the medicines, herbs, non-prescription drugs, or dietary supplements you use. Also tell them if you smoke, drink alcohol, or use illegal drugs. Some items may interact with your medicine. What should I watch for while using this medicine? Your condition will be monitored carefully while you are receiving this medicine. You will need important blood work done while you are taking this medicine. Do not become pregnant while taking this medicine or for at least 1 year after stopping it. Women of child-bearing potential will need to have a negative pregnancy test before starting this medicine. Women should inform their doctor if they wish to become pregnant or think they might be pregnant. There is a potential for serious side effects to an unborn child. Men should inform their doctors if they wish to father a child.  This medicine may lower sperm counts. Talk to your health care professional or pharmacist for more information. Do not breast-feed an infant while taking this medicine or for 1 year after the last dose. What side effects may I notice from receiving this medicine? Side effects that you should report to your doctor or health care professional as soon as possible: -allergic reactions like skin rash, itching or hives, swelling of the face, lips, or tongue -feeling faint or lightheaded, falls -pain, tingling, numbness, or weakness in the legs -signs and symptoms of infection like fever or chills; cough; flu-like symptoms; sore throat -vaginal bleeding Side effects that usually do not require medical attention (report to your doctor or health care professional if they continue or are  bothersome): -aches, pains -constipation -diarrhea -headache -hot flashes -nausea, vomiting -pain at site where injected -stomach pain This list may not describe all possible side effects. Call your doctor for medical advice about side effects. You may report side effects to FDA at 1-800-FDA-1088. Where should I keep my medicine? This drug is given in a hospital or clinic and will not be stored at home. NOTE: This sheet is a summary. It may not cover all possible information. If you have questions about this medicine, talk to your doctor, pharmacist, or health care provider.  2019 Elsevier/Gold Standard (2018-02-16 11:34:41)

## 2019-05-10 NOTE — Progress Notes (Signed)
Verbal order from Dr.Kale: OK to treat today with ALT 109

## 2019-05-11 ENCOUNTER — Ambulatory Visit: Payer: BC Managed Care – PPO | Admitting: Rehabilitation

## 2019-05-12 ENCOUNTER — Other Ambulatory Visit: Payer: Self-pay | Admitting: Hematology

## 2019-05-14 ENCOUNTER — Encounter: Payer: Self-pay | Admitting: Physical Therapy

## 2019-05-14 ENCOUNTER — Telehealth: Payer: Self-pay | Admitting: *Deleted

## 2019-05-14 ENCOUNTER — Other Ambulatory Visit: Payer: Self-pay

## 2019-05-14 ENCOUNTER — Ambulatory Visit: Payer: BC Managed Care – PPO | Admitting: Physical Therapy

## 2019-05-14 DIAGNOSIS — I890 Lymphedema, not elsewhere classified: Secondary | ICD-10-CM

## 2019-05-14 NOTE — Therapy (Signed)
[place], [place], [address] Phone: [phone]   Fax:  [phone]  Physical Therapy Treatment  Patient Details  Name: Stacey Cox MRN: [id number] Date of Birth: [birth year] Referring Provider (PT): Dr. Sullivan Lone   Encounter Date: 05/14/2019  PT End of Session - 05/14/19 1440    Visit Number  14    Number of Visits  21    Date for PT Re-Evaluation  06/08/19    PT Start Time  4503    PT Stop Time  1441    PT Time Calculation (min)  46 min    Activity Tolerance  Patient tolerated treatment well    Behavior During Therapy  [facility] for tasks assessed/performed       Past Medical History:  Diagnosis Date  . Cancer [facility])    Metastatic Breast Cancer    Past Surgical History:  Procedure Laterality Date  . CHOLECYSTECTOMY    . IR IMAGING GUIDED PORT INSERTION  07/14/2018  . TONSILLECTOMY      There were no vitals filed for this visit.  Subjective Assessment - 05/14/19 1359    Subjective  Just got refitted for a new daytime compression sleeve because it was too tight at my wrist. No complaints today.    Pertinent History  Back pain led to diagnosis of left breast cancer 01/20/17. 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 Multifocal tumor in the left breast with biopsy-proven left axillary metastases and metastases in bones and lung.   Has hd chemotherapy and radiation to her low back, 10 treatments  which helped with the pain. No other health issues. 04/10/19: She is currently undergoing chemotherapy with once a month infusions. she has completed brain radiation and has mets to liver and widespread sclerotic bone mets     Patient Stated Goals  to get lymphedema under control     Currently in Pain?  No/denies                       [facility] Adult PT Treatment/Exercise - 05/14/19 0001      Manual Therapy   Manual Lymphatic Drainage (MLD)   in Supine: Short neck,  superficial and deep abdominals, Lt inguinal and Rt axillary nodes, Lt axillo-inguinal and anterior inter-axillary anastomosis, then Lt UE from lateral upper arm to dorsal hand working from proximal to distal then retracing all steps.                   PT Long Term Goals - 05/09/19 1637      PT LONG TERM GOAL #1   Title  Pt will haver reduction of circumference of left arm at 10 cm proximal to ulnar styloid by 3 cm     Baseline  27 at baseline to 26.8 on 05/09/2019    Time  4    Period  Weeks    Status  On-going      PT LONG TERM GOAL #2   Title  Pt will be independent in management of lymphedema at home with self manual lymph draiange and use of compression garments     Baseline  pt now has garments and is awaiting flexitouch delivery 05/09/2019    Time  4    Period  Weeks    Status  On-going      PT LONG TERM GOAL #3   Title  Pt will be independent in conservative managment of lymphedema with elevation and exercise  Status  Achieved            Plan - 05/14/19 1440    Clinical Impression Statement  Patient with fullness present throuhout left arm but worst from writst to elbow medially. She reports this is due to not wearing her nighttime garment last night because she got too hot. She had no complaints during manual lymph drainage. She anticipates getting her Flexitouch in a week or so and should continue PT until then to prevent worsening of symptoms.    Rehab Potential  Good    PT Frequency  2x / week    PT Duration  4 weeks    PT Treatment/Interventions  ADLs/Self Care Home Management;Therapeutic exercise;Therapeutic activities;Patient/family education;Orthotic Fit/Training;Manual techniques;Manual lymph drainage;Compression bandaging;Passive range of motion;Taping;Vasopneumatic Device    PT Next Visit Plan  Continue with MLD until flexitouch arrives.    Consulted and Agree with Plan of Care  Patient       Patient will benefit from skilled therapeutic  intervention in order to improve the following deficits and impairments:  Decreased knowledge of use of DME, Postural dysfunction, Impaired UE functional use, Increased edema  Visit Diagnosis: 1. Lymphedema of left arm        Problem List Patient Active Problem List   Diagnosis Date Noted  . Palliative care by specialist   . DNR (do not resuscitate)   . Expressive aphasia   . Brain metastasis ([place]) 01/27/2019  . Seizures ([place]) 07/21/2018  . Macrocytic anemia 07/21/2018  . Thrombocytopenia ([place]) 07/21/2018  . Mild renal insufficiency 07/21/2018  . Anxiety 07/21/2018  . Brain metastases ([place])   . Malignant neoplasm of female breast ([place])   . Breast cancer metastasized to brain ([place]) 07/20/2018  . Port-A-Cath in place 07/17/2018  . Liver metastases ([place]) 07/12/2018  . Genetic testing 08/08/2017  . Palliative care status   . Counseling regarding advance care planning and goals of care 03/25/2017  . 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 Nausea without vomiting 03/21/2017  . Bone metastases ([place]) 02/01/2017  . 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 Malignant neoplasm metastatic to lung ([place])   . Cancer associated pain   . Carcinoma of breast metastatic to brain ([place]) 01/21/2017  . Pathologic fracture   . Breast mass, left    Annia Friendly, [place] 05/14/19 2:44 PM  [place], [place], [address] Phone: [phone]   Fax:  [phone]  Name: Stacey Cox MRN: [id number] Date of Birth: Apr 05, 1956

## 2019-05-14 NOTE — Telephone Encounter (Signed)
Stacey Cox called [phone]) and LVM. Family thinks that Stacey Cox is finding it difficult to get around now, going to the MD or the store (for example) and needs some assistance at home. They want to know how to go about getting her assistance at home - maybe Home Health aide? Question/concern given to Dr.Kale. Per Dr. Irene Limbo, patient is not currently homebound, so would not really qualify for home health care. The family could explore in home assistance through [email or web address] or agency that provides in home (not healthcare) assistance.  Attempted to contact Stacey Cox. Left information from Dr. Irene Limbo on named voice mail and stated that Dr.Kale will see patient on 6/25. Encouraged to contact office for further questions/concerns.

## 2019-05-15 ENCOUNTER — Inpatient Hospital Stay: Payer: BC Managed Care – PPO

## 2019-05-16 ENCOUNTER — Ambulatory Visit: Payer: BC Managed Care – PPO | Admitting: Physical Therapy

## 2019-05-16 NOTE — Progress Notes (Signed)
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  INTERVAL HISTORY   Stacey Cox is here for follow-up of her metastatic hormone positive HER-2 negative breast cancer. The patient's last visit with [place] was on 05/03/19. The pt reports that she is doing well overall. She is accompanied today by her friend Layla, via [employer].  The pt reports that she has been eating well and staying hydrated. She is wearing a compression glove today on her left arm and notes that her arm swelling has improved.   The pt notes that she has not had any problems with her teeth in the interim. She will follow up on 05/30/19. She denies swelling in the jaw, pain in the jaw, or fevers.   The pt notes that she had a seizure at home on 05/04/19. She notes that she was not taking her seizure medications on the same time every day, but has now made sure to take the medications at the same time every day. She notes that she has been watching a lot of TV as well.  The pt does not have a history of high blood sugars.  Lab results today (05/17/19) of CBC w/diff and CMP is as follows: all values are WNL except for RDW at 18.7, PLT at 147k, nRBC at 2.2%, Abs immature granulocytes at 0.16k, Glucose at 309, Albumin at 3.4, AST at 60, ALT at 105, Alk Phos at 303.  On review of systems, pt reports eating well, staying hydrated, stable weight, moving her bowels well, and denies swelling in the jaw, pain in the jaw, fevers, abdominal pains, leg swelling, and any other symptoms.   MEDICAL HISTORY:  Past Medical History:  Diagnosis Date  . Cancer [facility])    Metastatic Breast Cancer    SURGICAL HISTORY: Past Surgical History:  Procedure Laterality Date  .  CHOLECYSTECTOMY    . IR IMAGING GUIDED PORT INSERTION  07/14/2018  . TONSILLECTOMY      SOCIAL HISTORY: Social History   Socioeconomic History  . Marital status: Single    Spouse name: Not on file  . Number of children: Not on file  . Years of education: Not on file  . Highest education level: Not on file  Occupational History  . Not on file  Social Needs  . Financial resource strain: Not on file  . Food insecurity    Worry: Not on file    Inability: Not on file  . Transportation needs    Medical: Not on file    Non-medical: Not on file  Tobacco Use  . Smoking status: Current Every Day Smoker    Packs/day: 0.15    Types: Cigarettes    Last attempt to quit: 01/21/2017    Years since quitting: 2.3  . Smokeless tobacco: Never Used  Substance and Sexual Activity  . Alcohol use: No    Alcohol/week: 0.0 standard drinks  . Drug use: No  . Sexual activity: Never  Lifestyle  . Physical activity    Days per week: Not on file    Minutes per session: Not on file  . Stress: Not on file  Relationships  . Social connections    Talks on phone: Not on file  Gets together: Not on file    Attends religious service: Not on file    Active member of club or organization: Not on file    Attends meetings of clubs or organizations: Not on file    Relationship status: Not on file  . Intimate partner violence    Fear of current or ex partner: No    Emotionally abused: No    Physically abused: No    Forced sexual activity: No  Other Topics Concern  . Not on file  Social History Narrative   Lives alone. 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 Has phobia of the medical system.  Cigarette smoker 1/2 PPD for about 40 yrs Social alcohol use No drugs Professor at [employer] no children  FAMILY HISTORY: Family History  Problem Relation Age of Onset  . Cancer Mother        breast  . Cancer Sister        breast  Mother with h/o breast cancer in her 77's Sister with breast cancer in her  46ys and later was diagnosed with multiple myeloma. (Patient is not aware of any specific breast cancer mutations present)  ALLERGIES:  is allergic to thorazine [chlorpromazine] and ribociclib.  MEDICATIONS:  Current Outpatient Medications  Medication Sig Dispense Refill  . aspirin EC 81 MG tablet Take 1 tablet (81 mg total) by mouth daily.    . chlorhexidine (PERIDEX) 0.12 % solution Use as directed 15 mLs in the mouth or throat 2 (two) times daily as needed (mouth rinse after vomiting). Swish and spit 473 mL 1  . dexamethasone (DECADRON) 1 MG tablet Take 1 tablet (1 mg total) by mouth daily. 30 tablet 2  . escitalopram (LEXAPRO) 20 MG tablet TAKE 1 TABLET(20 MG) BY MOUTH DAILY 30 tablet 0  . feeding supplement, ENSURE ENLIVE, (ENSURE ENLIVE) LIQD Take 237 mLs by mouth 2 (two) times daily between meals. (Patient taking differently: Take 237 mLs by mouth daily. ) 237 mL 12  . hydrOXYzine (ATARAX/VISTARIL) 10 MG tablet Take 10 mg by mouth 2 (two) times daily.    [name] levETIRAcetam (KEPPRA) 1000 MG tablet Take 1 tablet (1,000 mg total) by mouth 2 (two) times daily. 60 tablet 5  . pantoprazole (PROTONIX) 40 MG tablet TAKE 1 TABLET(40 MG) BY MOUTH DAILY 30 tablet 1  . Vitamin D, Ergocalciferol, (DRISDOL) 1.25 MG (50000 UT) CAPS capsule TAKE 1 CAPSULE BY MOUTH 3 TIMES WEEKLY 24 capsule 0   No current facility-administered medications for this visit.    Facility-Administered Medications Ordered in Other Visits  Medication Dose Route Frequency Provider Last Rate Last Dose  . denosumab (XGEVA) injection 120 mg  120 mg Subcutaneous Once [name], MD      . fulvestrant (FASLODEX) injection 500 mg  500 mg Intramuscular Once [name], MD        REVIEW OF SYSTEMS:    A 10+ POINT REVIEW OF SYSTEMS WAS OBTAINED including neurology, dermatology, psychiatry, cardiac, respiratory, lymph, extremities, GI, GU, Musculoskeletal, constitutional, breasts, reproductive, HEENT.  All pertinent  positives are noted in the HPI.  All others are negative.   PHYSICAL EXAMINATION:  ECOG PERFORMANCE STATUS: 1 - Symptomatic but completely ambulatory  Vitals:   05/17/19 1342  BP: 119/90  Pulse: (!) 101  Resp: 17  Temp: 98.9 F (37.2 C)  SpO2: 100%   Filed Weights   05/17/19 1342  Weight: 174 lb 7 oz (79.1 kg)   Body mass index is 29.03 kg/m.  GENERAL:alert, in  no acute distress and comfortable SKIN: no acute rashes, no significant lesions EYES: conjunctiva are pink and non-injected, sclera anicteric OROPHARYNX: MMM, no exudates, no oropharyngeal erythema or ulceration NECK: supple, no JVD LYMPH:  no palpable lymphadenopathy in the cervical, axillary or inguinal regions LUNGS: clear to auscultation b/l with normal respiratory effort HEART: regular rate & rhythm ABDOMEN:  normoactive bowel sounds , non tender, not distended. No palpable hepatosplenomegaly.  Extremity: no pedal edema PSYCH: alert & oriented x 3 with fluent speech NEURO: no focal motor/sensory deficits   LABORATORY DATA:  I have reviewed the data as listed  CBC Latest Ref Rng & Units 05/17/2019 05/10/2019 05/03/2019  WBC 4.0 - 10.5 K/uL 4.1 5.5 9.9  Hemoglobin 12.0 - 15.0 g/dL 12.7 11.6(L) 12.8  Hematocrit 36.0 - 46.0 % 38.2 35.8(L) 39.3  Platelets 150 - 400 K/uL 147(L) 168 149(L)   CBC    Component Value Date/Time   WBC 4.1 05/17/2019 1214   RBC 4.25 05/17/2019 1214   HGB 12.7 05/17/2019 1214   HGB 12.7 04/12/2019 0901   HGB 13.9 11/24/2017 1340   HCT 38.2 05/17/2019 1214   HCT 42.0 11/24/2017 1340   PLT 147 (L) 05/17/2019 1214   PLT 198 04/12/2019 0901   PLT 212 11/24/2017 1340   MCV 89.9 05/17/2019 1214   MCV 97.0 11/24/2017 1340   MCH 29.9 05/17/2019 1214   MCHC 33.2 05/17/2019 1214   RDW 18.7 (H) 05/17/2019 1214   RDW 14.1 11/24/2017 1340   LYMPHSABS 0.9 05/17/2019 1214   LYMPHSABS 1.4 11/24/2017 1340   MONOABS 0.4 05/17/2019 1214   MONOABS 0.3 11/24/2017 1340   EOSABS 0.1 05/17/2019  1214   EOSABS 0.1 11/24/2017 1340   BASOSABS 0.0 05/17/2019 1214   BASOSABS 0.1 11/24/2017 1340    . CMP Latest Ref Rng & Units 05/17/2019 05/10/2019 05/03/2019  Glucose 70 - 99 mg/dL 309(H) 278(H) 230(H)  BUN 8 - 23 mg/dL 15 13 14   Creatinine 0.44 - 1.00 mg/dL 0.83 0.76 0.76  Sodium 135 - 145 mmol/L 136 136 139  Potassium 3.5 - 5.1 mmol/L 3.9 3.6 3.5  Chloride 98 - 111 mmol/L 99 100 102  CO2 22 - 32 mmol/L 27 26 26   Calcium 8.9 - 10.3 mg/dL 8.9 9.2 9.2  Total Protein 6.5 - 8.1 g/dL 6.7 6.5 6.6  Total Bilirubin 0.3 - 1.2 mg/dL 0.7 0.5 0.5  Alkaline Phos 38 - 126 U/L 303(H) 356(H) 443(H)  AST 15 - 41 U/L 60(H) 55(H) 51(H)  ALT 0 - 44 U/L 105(H) 109(H) 99(H)   07/18/18 Pathology:          RADIOGRAPHIC STUDIES: I have personally reviewed the radiological images as listed and agreed with the findings in the report. No results found.  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 Multifocal tumor in the left breast with biopsy-proven left axillary metastases.   Noted to have extensive bone metastases and pulmonary metastases. Patient was noted to have calvarial metastases but no overt parenchymal metastasis 09/14/17 CT Chest/ad/pelvis Results discussed in details - good response.  02/22/18 CT revealed  Newly apparent 2.1 by 2.0 cm rim enhancing lesion in the right hepatic lobe suspicious for a metastatic lesion. This was not apparent on prior exams although the prior exams were all noncontrast and thus the lesion may have been present but with reduced conspicuity. There is also a small enhancing  lesion further posteriorly in the right hepatic lobe which is technically nonspecific and could be a small hemangioma or a small metastatic lesion. Stable distribution and appearance of prior sclerotic osseous metastatic disease without bony progression. Stable appearance of what  appears to be an accessory spleen with a cystic lesion below the main portion of the spleen.   07/06/18 CT C/A/P which revealed New pattern of heterogeneous enhancement and enhancing ill-defined lesions in the central LEFT and RIGHT hepatic lobe at site prior enhancing lesion is highly concerning for progression of of infiltrative malignancy in the liver occupying a large portion of the RIGHT hepatic lobe and central LEFT hepatic lobe. 2. Small amount of free fluid in the abdomen pelvis. 3. No metastatic adenopathy.4. No evidence pulmonary metastasis or lymphadenopathy. 5. Stable dense sclerotic skeletal metastasis   07/11/18 ECHO revealed LV EF of 55%-60%   07/18/18 Liver needle/core biopsy right lesion consi with Ki67 at 40%, ER/PR +ve   07/28/18 MRI Abdomen MRCP revealed Extensive hepatic metastatic disease, possibly progressive from recent abdominal CT. 2. No evidence of intrahepatic or extrahepatic biliary dilatation. Elevated liver function studies may be secondary to intrahepatic cholestasis. 3. Increased ascites without peritoneal nodularity or suspicious enhancement. 4. Grossly stable widespread osseous metastatic disease.   S/p 6 cycles of AC completed on 11/01/18  03/20/19 CT C/A/P revealed "Although the dominant liver lesion appears improved in the interval, there appears to be 2 new liver metastases on today's exam, concerning for disease progression. 2. Similar appearance of the widespread sclerotic bone metastases. 3. Interval decrease in ascites. 4. Aortic Atherosclerois."  Pt's planned third weekly infusion of Taxol was cancelled due to dental abscess s/p antibiotic course. Then cancelled again due to thrombocytopenia thought to be due to antibiotic course.  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 Much improved back pain.  # 3 Neutropenia Related to her Ribociclib - resolved. Patient is currently on Verzenio and has not developed any neutropenia. This is being monitored.  #4  Increased LFTs due to new liver metastases  #5 Cancer related pain - controlled - status post palliative RT to spine - no currently needing any pain medications.  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 No other new medication. Now resolved. Monitoring on increasing doses of Verzenio. No issues with recurrent rash on Verzenio 110m po BID  #7 Brain metastasis  07/20/18 CT Head revealed 1.8 x 2.4 x 3.9 cm LEFT frontal cortical based mass with possible dural component highly concerning for metastatic disease, less likely abscess or cerebritis. Severe vasogenic edema with 5 mm LEFT to RIGHT subfalcine herniation. No ventricular entrapment. 2. 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 3. Multiple calvarial metastasis. 4. Cerebellar atrophy.    07/21/18 MRI Brain revealed  Irregular plaque-like mass, likely metastasis, measuring up to 5.1 cm, appears to originate from the left anterolateral frontal dura with invasion of the underlying frontal lobe of the brain. Mass effect from brain edema and the lesion partially effaces the frontal horn of left lateral ventricle and results in 7 mm left-to-right midline shift of anterior septum pellucidum. 2. Diffuse dural thickening over the left cerebral convexity may represent edema associated with the tumor or invasive neoplasm. 3. 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   07/28/18 MRI Brain revealed Extensive nodular dural enhancement left frontal lobe with associated enhancing mass growing in the left frontal lobe is unchanged in size  and compatible with metastatic disease. This is associated with metastatic disease to the left frontal bone. Extensive edema in the left frontal lobe has progressed in the interval. Blastic metastatic disease throughout the calvarium. Metastatic disease in the cervical spine. Enhancing mass along the floor of the posterior  fossa on the right is stable and most consistent with meningioma. 6 mm enhancing mass in the prepontine cistern on the right also unchanged and compatible with meningioma.   s/p SRS radiation therapy  10/23/18 MRI Brain revealed SRS protocol demonstrating treatment response: Regression of LEFT frontal dural metastasis, decreased vasogenic edema and parenchymal invasion. Similar calvarial metastasis. 2. Stable appearance of 2 small posterior fossa meningiomas. 3. No acute intracranial process.   02/02/19 MRI Brain revealed "Marked progression of dural disease left greater than right as described above. Pronounced brain edema, left more than right with left-to-right shift of 1 cm. 2. I do not identify any brain parenchymal metastases. 3. Small stable incidental meningiomas in the posterior fossa as above."  Pt is s/p SRS for dural metastases.  PLAN:  -Continue follow up with Dr. Mickeal Skinner in Neuro-oncology on 02/06/2019 and Dr. Tammi Klippel in [place] with Delton See every 4 weeks   #8 Vitamin D deficiency Vit D levels improved to 44.8  On 12/01/2018 -continue Vit D replacement.  #9 h/o Left upper extremity swelling- [place] neg for DVT - now swelling resolved.  PLAN:  -Discussed pt labwork today, 05/17/19; blood counts are holding well. Glucose at 309, Alk Phos improved to 303, other chemistries are stable. -The pt has no prohibitive toxicities from continuing C2D15 Taxol at this time. -Will continue watching blood glucose and recommended decreasing sugary food intake -Proceed with 06/07/19 MRI Brain -Holding Xgeva due to recent dental concerns -Continue care with outpatient lymphedema PT clinic for left arm lymphedema next week on 04/10/19. -Continue Keppra -Continue tapering dexamethasone as per Dr Mickeal Skinner - currently on 35m daily -Continue 20MEQ Potassium PO BID replacement  -Continue Ergocalciferol 50k units, three times a week, with fatty foods to aide absorption -Continue Faslodex every 4 weeks  -Will see the pt back in 2 weeks   -Please schedule C3 of chemotherapy as scheduled -labs with each treatment -MD visit C3D1   All of the patients questions were answered with apparent satisfaction. The patient knows to call the clinic with any problems, questions or concerns.   The total time spent in the appt was 25 minutes and more than 50% was on counseling and direct patient cares.    GSullivan LoneMD MYoung HarrisAAHIVMS SSt Josephs HospitalCOsf [facility] System [facility] Mary Medical CenterHematology/Oncology Physician C[facility] (Office):       [phone](Work cell):  [phone](Fax):           [phone] I, SBaldwin Jamaica am acting as a scribe for Dr. GSullivan Lone   .I have reviewed the above documentation for accuracy and completeness, and I agree with the above. .Brunetta GeneraMD

## 2019-05-17 ENCOUNTER — Telehealth: Payer: Self-pay | Admitting: Hematology

## 2019-05-17 ENCOUNTER — Inpatient Hospital Stay: Payer: BC Managed Care – PPO

## 2019-05-17 ENCOUNTER — Other Ambulatory Visit: Payer: Self-pay

## 2019-05-17 ENCOUNTER — Inpatient Hospital Stay (HOSPITAL_BASED_OUTPATIENT_CLINIC_OR_DEPARTMENT_OTHER): Payer: BC Managed Care – PPO | Admitting: Hematology

## 2019-05-17 VITALS — BP 119/90 | HR 101 | Temp 98.9°F | Resp 17 | Ht 65.0 in | Wt 174.4 lb

## 2019-05-17 VITALS — HR 86

## 2019-05-17 DIAGNOSIS — C50919 Malignant neoplasm of unspecified site of unspecified female breast: Secondary | ICD-10-CM

## 2019-05-17 DIAGNOSIS — G893 Neoplasm related pain (acute) (chronic): Secondary | ICD-10-CM

## 2019-05-17 DIAGNOSIS — C787 Secondary malignant neoplasm of liver and intrahepatic bile duct: Secondary | ICD-10-CM | POA: Diagnosis not present

## 2019-05-17 DIAGNOSIS — C7800 Secondary malignant neoplasm of unspecified lung: Secondary | ICD-10-CM

## 2019-05-17 DIAGNOSIS — C50812 Malignant neoplasm of overlapping sites of left female breast: Secondary | ICD-10-CM

## 2019-05-17 DIAGNOSIS — E559 Vitamin D deficiency, unspecified: Secondary | ICD-10-CM

## 2019-05-17 DIAGNOSIS — Z170 Estrogen receptor positive status [ER+]: Secondary | ICD-10-CM

## 2019-05-17 DIAGNOSIS — Z7189 Other specified counseling: Secondary | ICD-10-CM

## 2019-05-17 DIAGNOSIS — C7951 Secondary malignant neoplasm of bone: Secondary | ICD-10-CM

## 2019-05-17 DIAGNOSIS — Z95828 Presence of other vascular implants and grafts: Secondary | ICD-10-CM

## 2019-05-17 DIAGNOSIS — Z5111 Encounter for antineoplastic chemotherapy: Secondary | ICD-10-CM

## 2019-05-17 DIAGNOSIS — C7931 Secondary malignant neoplasm of brain: Secondary | ICD-10-CM

## 2019-05-17 DIAGNOSIS — D696 Thrombocytopenia, unspecified: Secondary | ICD-10-CM

## 2019-05-17 DIAGNOSIS — I890 Lymphedema, not elsewhere classified: Secondary | ICD-10-CM

## 2019-05-17 LAB — CBC WITH DIFFERENTIAL/PLATELET
Abs Immature Granulocytes: 0.16 10*3/uL — ABNORMAL HIGH (ref 0.00–0.07)
Basophils Absolute: 0 10*3/uL (ref 0.0–0.1)
Basophils Relative: 1 %
Eosinophils Absolute: 0.1 10*3/uL (ref 0.0–0.5)
Eosinophils Relative: 2 %
HCT: 38.2 % (ref 36.0–46.0)
Hemoglobin: 12.7 g/dL (ref 12.0–15.0)
Immature Granulocytes: 4 %
Lymphocytes Relative: 23 %
Lymphs Abs: 0.9 10*3/uL (ref 0.7–4.0)
MCH: 29.9 pg (ref 26.0–34.0)
MCHC: 33.2 g/dL (ref 30.0–36.0)
MCV: 89.9 fL (ref 80.0–100.0)
Monocytes Absolute: 0.4 10*3/uL (ref 0.1–1.0)
Monocytes Relative: 9 %
Neutro Abs: 2.6 10*3/uL (ref 1.7–7.7)
Neutrophils Relative %: 61 %
Platelets: 147 10*3/uL — ABNORMAL LOW (ref 150–400)
RBC: 4.25 MIL/uL (ref 3.87–5.11)
RDW: 18.7 % — ABNORMAL HIGH (ref 11.5–15.5)
WBC: 4.1 10*3/uL (ref 4.0–10.5)
nRBC: 2.2 % — ABNORMAL HIGH (ref 0.0–0.2)

## 2019-05-17 LAB — CMP (CANCER CENTER ONLY)
ALT: 105 U/L — ABNORMAL HIGH (ref 0–44)
AST: 60 U/L — ABNORMAL HIGH (ref 15–41)
Albumin: 3.4 g/dL — ABNORMAL LOW (ref 3.5–5.0)
Alkaline Phosphatase: 303 U/L — ABNORMAL HIGH (ref 38–126)
Anion gap: 10 (ref 5–15)
BUN: 15 mg/dL (ref 8–23)
CO2: 27 mmol/L (ref 22–32)
Calcium: 8.9 mg/dL (ref 8.9–10.3)
Chloride: 99 mmol/L (ref 98–111)
Creatinine: 0.83 mg/dL (ref 0.44–1.00)
GFR, Est AFR Am: >60 mL/min (ref >60)
GFR, Estimated: >60 mL/min (ref >60)
Glucose, Bld: 309 mg/dL — ABNORMAL HIGH (ref 70–99)
Potassium: 3.9 mmol/L (ref 3.5–5.1)
Sodium: 136 mmol/L (ref 135–145)
Total Bilirubin: 0.7 mg/dL (ref 0.3–1.2)
Total Protein: 6.7 g/dL (ref 6.5–8.1)

## 2019-05-17 MED ORDER — DIPHENHYDRAMINE HCL 50 MG/ML IJ SOLN
50.0000 mg | Freq: Once | INTRAMUSCULAR | Status: AC
  Administered 2019-05-17: 50 mg via INTRAVENOUS

## 2019-05-17 MED ORDER — SODIUM CHLORIDE 0.9 % IV SOLN
80.0000 mg/m2 | Freq: Once | INTRAVENOUS | Status: AC
  Administered 2019-05-17: 150 mg via INTRAVENOUS
  Filled 2019-05-17: qty 25

## 2019-05-17 MED ORDER — SODIUM CHLORIDE 0.9 % IV SOLN
Freq: Once | INTRAVENOUS | Status: AC
  Administered 2019-05-17: 16:00:00 via INTRAVENOUS
  Filled 2019-05-17: qty 6

## 2019-05-17 MED ORDER — PEGFILGRASTIM 6 MG/0.6ML ~~LOC~~ PSKT
6.0000 mg | PREFILLED_SYRINGE | Freq: Once | SUBCUTANEOUS | Status: AC
  Administered 2019-05-17: 18:00:00 6 mg via SUBCUTANEOUS

## 2019-05-17 MED ORDER — PEGFILGRASTIM 6 MG/0.6ML ~~LOC~~ PSKT
PREFILLED_SYRINGE | SUBCUTANEOUS | Status: AC
  Filled 2019-05-17: qty 0.6

## 2019-05-17 MED ORDER — FAMOTIDINE IN NACL 20-0.9 MG/50ML-% IV SOLN
INTRAVENOUS | Status: AC
  Filled 2019-05-17: qty 50

## 2019-05-17 MED ORDER — SODIUM CHLORIDE 0.9% FLUSH
10.0000 mL | INTRAVENOUS | Status: DC | PRN
  Administered 2019-05-17: 10 mL
  Filled 2019-05-17: qty 10

## 2019-05-17 MED ORDER — FAMOTIDINE IN NACL 20-0.9 MG/50ML-% IV SOLN
20.0000 mg | Freq: Once | INTRAVENOUS | Status: AC
  Administered 2019-05-17: 20 mg via INTRAVENOUS

## 2019-05-17 MED ORDER — HEPARIN SOD (PORK) LOCK FLUSH 100 UNIT/ML IV SOLN
500.0000 [IU] | Freq: Once | INTRAVENOUS | Status: AC | PRN
  Administered 2019-05-17: 500 [IU]
  Filled 2019-05-17: qty 5

## 2019-05-17 MED ORDER — SODIUM CHLORIDE 0.9 % IV SOLN
Freq: Once | INTRAVENOUS | Status: AC
  Administered 2019-05-17: 15:00:00 via INTRAVENOUS
  Filled 2019-05-17: qty 250

## 2019-05-17 MED ORDER — SODIUM CHLORIDE 0.9% FLUSH
10.0000 mL | Freq: Once | INTRAVENOUS | Status: AC
  Administered 2019-05-17: 10 mL
  Filled 2019-05-17: qty 10

## 2019-05-17 MED ORDER — DIPHENHYDRAMINE HCL 50 MG/ML IJ SOLN
INTRAMUSCULAR | Status: AC
  Filled 2019-05-17: qty 1

## 2019-05-17 NOTE — Progress Notes (Signed)
Verbal order from Dr.Kale: Ok to receive treatment today with ALT 103

## 2019-05-17 NOTE — Patient Instructions (Signed)
[facility] Discharge Instructions for Patients Receiving Chemotherapy  Today you received the following chemotherapy agents Paclitaxel (TAXOL).  To help prevent nausea and vomiting after your treatment, we encourage you to take your nausea medication as prescribed.   If you develop nausea and vomiting that is not controlled by your nausea medication, call the clinic.   BELOW ARE SYMPTOMS THAT SHOULD BE REPORTED IMMEDIATELY:  *FEVER GREATER THAN 100.5 F  *CHILLS WITH OR WITHOUT FEVER  NAUSEA AND VOMITING THAT IS NOT CONTROLLED WITH YOUR NAUSEA MEDICATION  *UNUSUAL SHORTNESS OF BREATH  *UNUSUAL BRUISING OR BLEEDING  TENDERNESS IN MOUTH AND THROAT WITH OR WITHOUT PRESENCE OF ULCERS  *URINARY PROBLEMS  *BOWEL PROBLEMS  UNUSUAL RASH Items with * indicate a potential emergency and should be followed up as soon as possible.  Feel free to call the clinic should you have any questions or concerns. The clinic phone number is [phone].  Please show the [facility] at check-in to the Emergency Department and triage nurse.  Coronavirus (COVID-19) Are you at risk?  Are you at risk for the Coronavirus (COVID-19)?  To be considered HIGH RISK for Coronavirus (COVID-19), you have to meet the following criteria:  . Traveled to [place], [place], [place], [place] or [place]; or in the [place] to [place], [place], [place], or [place]; and have fever, cough, and shortness of breath within the last 2 weeks of travel OR . Been in close contact with a person diagnosed with COVID-19 within the last 2 weeks and have fever, cough, and shortness of breath . IF YOU DO NOT MEET THESE CRITERIA, YOU ARE CONSIDERED LOW RISK FOR COVID-19.  What to do if you are HIGH RISK for COVID-19?  [name] If you are having a medical emergency, call 911. . Seek medical care right away. Before you go to a doctor's office, urgent care or emergency department, call ahead and tell them  about your recent travel, contact with someone diagnosed with COVID-19, and your symptoms. You should receive instructions from your physician's office regarding next steps of care.  . When you arrive at healthcare provider, tell the healthcare staff immediately you have returned from visiting [place], [place], [place], [place] or [place]; or traveled in the [place] to [place], [place], [place], or [place]; in the last two weeks or you have been in close contact with a person diagnosed with COVID-19 in the last 2 weeks.   . Tell the health care staff about your symptoms: fever, cough and shortness of breath. . After you have been seen by a medical provider, you will be either: o Tested for (COVID-19) and discharged home on quarantine except to seek medical care if symptoms worsen, and asked to  - Stay home and avoid contact with others until you get your results (4-5 days)  - Avoid travel on public transportation if possible (such as bus, train, or airplane) or o Sent to the Emergency Department by EMS for evaluation, COVID-19 testing, and possible admission depending on your condition and test results.  What to do if you are LOW RISK for COVID-19?  Reduce your risk of any infection by using the same precautions used for avoiding the common cold or flu:  [name] Wash your hands often with soap and warm water for at least 20 seconds.  If soap and water are not readily available, use an alcohol-based hand sanitizer with at least 60% alcohol.  . If coughing or  sneezing, cover your mouth and nose by coughing or sneezing into the elbow areas of your shirt or coat, into a tissue or into your sleeve (not your hands). . Avoid shaking hands with others and consider head nods or verbal greetings only. . Avoid touching your eyes, nose, or mouth with unwashed hands.  . Avoid close contact with people who are sick. . Avoid places or events with large numbers of people in one location, like concerts or  sporting events. . Carefully consider travel plans you have or are making. . If you are planning any travel outside or inside the [place], visit the CDC's Travelers' Health webpage for the latest health notices. . If you have some symptoms but not all symptoms, continue to monitor at home and seek medical attention if your symptoms worsen. . If you are having a medical emergency, call 911.   [facility] / e-Visit: [email or web address]         [facility]: [facility]: [phone]                   [facility]: [phone]    Fulvestrant injection What is this medicine? FULVESTRANT (ful VES trant) blocks the effects of estrogen. It is used to treat breast cancer. This medicine may be used for other purposes; ask your health care provider or pharmacist if you have questions. COMMON BRAND NAME(S): FASLODEX What should I tell my health care provider before I take this medicine? They need to know if you have any of these conditions: -bleeding disorders -liver disease -low blood counts, like low white cell, platelet, or red cell counts -an unusual or allergic reaction to fulvestrant, other medicines, foods, dyes, or preservatives -pregnant or trying to get pregnant -breast-feeding How should I use this medicine? This medicine is for injection into a muscle. It is usually given by a health care professional in a hospital or clinic setting. Talk to your pediatrician regarding the use of this medicine in children. Special care may be needed. Overdosage: If you think you have taken too much of this medicine contact a poison control center or emergency room at once. NOTE: This medicine is only for you. Do not share this medicine with others. What if I miss a dose? It is important not to miss your dose. Call your doctor or health care professional  if you are unable to keep an appointment. What may interact with this medicine? -medicines that treat or prevent blood clots like warfarin, enoxaparin, dalteparin, apixaban, dabigatran, and rivaroxaban This list may not describe all possible interactions. Give your health care provider a list of all the medicines, herbs, non-prescription drugs, or dietary supplements you use. Also tell them if you smoke, drink alcohol, or use illegal drugs. Some items may interact with your medicine. What should I watch for while using this medicine? Your condition will be monitored carefully while you are receiving this medicine. You will need important blood work done while you are taking this medicine. Do not become pregnant while taking this medicine or for at least 1 year after stopping it. Women of child-bearing potential will need to have a negative pregnancy test before starting this medicine. Women should inform their doctor if they wish to become pregnant or think they might be pregnant. There is a potential for serious side effects to an unborn child. Men should inform their doctors if they wish to father a child.  This medicine may lower sperm counts. Talk to your health care professional or pharmacist for more information. Do not breast-feed an infant while taking this medicine or for 1 year after the last dose. What side effects may I notice from receiving this medicine? Side effects that you should report to your doctor or health care professional as soon as possible: -allergic reactions like skin rash, itching or hives, swelling of the face, lips, or tongue -feeling faint or lightheaded, falls -pain, tingling, numbness, or weakness in the legs -signs and symptoms of infection like fever or chills; cough; flu-like symptoms; sore throat -vaginal bleeding Side effects that usually do not require medical attention (report to your doctor or health care professional if they continue or are  bothersome): -aches, pains -constipation -diarrhea -headache -hot flashes -nausea, vomiting -pain at site where injected -stomach pain This list may not describe all possible side effects. Call your doctor for medical advice about side effects. You may report side effects to FDA at 1-800-FDA-1088. Where should I keep my medicine? This drug is given in a hospital or clinic and will not be stored at home. NOTE: This sheet is a summary. It may not cover all possible information. If you have questions about this medicine, talk to your doctor, pharmacist, or health care provider.  2019 Elsevier/Gold Standard (2018-02-16 11:34:41)

## 2019-05-17 NOTE — Telephone Encounter (Signed)
Per 6/25 los appts already scheduled.

## 2019-05-18 ENCOUNTER — Telehealth: Payer: Self-pay | Admitting: *Deleted

## 2019-05-18 NOTE — Telephone Encounter (Signed)
Called to verify appointments for infusion

## 2019-05-22 ENCOUNTER — Other Ambulatory Visit: Payer: Self-pay | Admitting: Hematology

## 2019-05-22 ENCOUNTER — Other Ambulatory Visit: Payer: Self-pay

## 2019-05-22 ENCOUNTER — Ambulatory Visit: Payer: BC Managed Care – PPO | Admitting: Physical Therapy

## 2019-05-22 ENCOUNTER — Encounter: Payer: Self-pay | Admitting: Physical Therapy

## 2019-05-22 DIAGNOSIS — I890 Lymphedema, not elsewhere classified: Secondary | ICD-10-CM

## 2019-05-22 DIAGNOSIS — R2689 Other abnormalities of gait and mobility: Secondary | ICD-10-CM

## 2019-05-22 DIAGNOSIS — L308 Other specified dermatitis: Secondary | ICD-10-CM

## 2019-05-22 DIAGNOSIS — L03114 Cellulitis of left upper limb: Secondary | ICD-10-CM

## 2019-05-22 MED ORDER — TRIAMCINOLONE ACETONIDE 0.025 % EX OINT: 1.0000 "application " | TOPICAL_OINTMENT | Freq: Two times a day (BID) | CUTANEOUS | 0 refills | Status: DC

## 2019-05-22 MED ORDER — CEPHALEXIN 500 MG PO CAPS: 500.0000 mg | ORAL_CAPSULE | Freq: Four times a day (QID) | ORAL | 0 refills | Status: AC

## 2019-05-22 NOTE — Therapy (Signed)
[facility] [place], [place], [address] Phone: [phone]   Fax:  [phone]  Patient Details  Name: Stacey Cox MRN: [id number] Date of Birth: [birth year] Referring Provider:  [name], MD  Encounter Date: 05/22/2019   Norwood Levo 05/22/2019, 5:02 PM            pt presents to day with pain in left elbow and discoloration in arm. She is calling [place], Dr. Grier Mitts nurse for advice if she should come in to be checked for cellulitis. This note is filed so [place] can look at pictures of arm. Full Pt note to follow  Maudry Diego, PT 05/22/19 [place], [place], [address] Phone: [phone]   Fax:  [phone]

## 2019-05-22 NOTE — Therapy (Addendum)
[facility] [address] [place], [place], [address] Phone: [phone]   Fax:  [phone]  Physical Therapy Treatment  Patient Details  Name: Stacey Cox MRN: [id number] Date of Birth: 06/12/1956 Referring Provider (PT): Dr. Gautam Kale   Encounter Date: 05/22/2019  PT End of Session - 05/22/19 1658    Visit Number  15    Number of Visits  21    Date for PT Re-Evaluation  06/08/19    PT Start Time  1430    PT Stop Time  1520    PT Time Calculation (min)  50 min    Activity Tolerance  Patient tolerated treatment well    Behavior During Therapy  WFL for tasks assessed/performed       Past Medical History:  Diagnosis Date  . Cancer (HCC)    Metastatic Breast Cancer    Past Surgical History:  Procedure Laterality Date  . CHOLECYSTECTOMY    . IR IMAGING GUIDED PORT INSERTION  07/14/2018  . TONSILLECTOMY      There were no vitals filed for this visit.  Subjective Assessment - 05/22/19 1530    Subjective  Pt comes in today appearing to be in distress. She got her new Elvarex sleeve yesterday and has it on today.  She says her purple garment ( the Tribute night)  is very uncomfortable and is making her elbow hurt    Pertinent History  Back pain led to diagnosis of left breast cancer 01/20/17. 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 Multifocal tumor in the left breast with biopsy-proven left axillary metastases and metastases in bones and lung.   Has hd chemotherapy and radiation to her low back, 10 treatments  which helped with the pain. No other health issues. 04/10/19: She is currently undergoing chemotherapy with once a month infusions. she has completed brain radiation and has mets to liver and widespread sclerotic bone mets     Currently in Pain?  Yes    Pain Score  --   did not rate                  LYMPHEDEMA/ONCOLOGY QUESTIONNAIRE - 05/22/19 1450      Left Upper  Extremity Lymphedema   10 cm Proximal to Olecranon Process  33.5 cm    Olecranon Process  28.5 cm    15 cm Proximal to Ulnar Styloid Process  29.2 cm    10 cm Proximal to Ulnar Styloid Process  27.5 cm    Just Proximal to Ulnar Styloid Process  18 cm    Across Hand at Thumb Web Space  19.5 cm    At Base of 2nd Digit  6.6 cm                [facility] Adult PT Treatment/Exercise - 05/22/19 0001      Manual Therapy   Edema Management  remeasured arm     Manual Lymphatic Drainage (MLD)   in Supine: Short neck, superficial and deep abdominals, Lt inguinal and Rt axillary nodes, Lt axillo-inguinal and anterior inter-axillary anastomosis, then Lt UE from lateral upper arm to dorsal hand working from proximal to distal then retracing all steps.  then to sidelying for posterior interaxiallary anastamosis     Compression Bandaging  applied medium tg soft with large fold at the top                   PT Long Term Goals - 05/09/19 1637        PT LONG TERM GOAL #1   Title  Pt will haver reduction of circumference of left arm at 10 cm proximal to ulnar styloid by 3 cm     Baseline  27 at baseline to 26.8 on 05/09/2019    Time  4    Period  Weeks    Status  On-going      PT LONG TERM GOAL #2   Title  Pt will be independent in management of lymphedema at home with self manual lymph draiange and use of compression garments     Baseline  pt now has garments and is awaiting flexitouch delivery 05/09/2019    Time  4    Period  Weeks    Status  On-going      PT LONG TERM GOAL #3   Title  Pt will be independent in conservative managment of lymphedema with elevation and exercise     Status  Achieved            Plan - 05/22/19 1658    Clinical Impression Statement  Pt is not tolerating the flat knit elvarex compression sleeve as it is binding her arm and appears to be leaving dark and red marks on skin.  She says she is also not tolerating her Tribute Night garment.  Will send an  email to fitter.  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  Her arm did not feel warm and was not tender to touch, but was red and swollen.    PT Treatment/Interventions  ADLs/Self Care Home Management;Therapeutic exercise;Therapeutic activities;Patient/family education;Orthotic Fit/Training;Manual techniques;Manual lymph drainage;Compression bandaging;Passive range of motion;Taping;Vasopneumatic Device    Consulted and Agree with Plan of Care  Patient       Patient will benefit from skilled therapeutic intervention in order to improve the following deficits and impairments:  Decreased knowledge of use of DME, Postural dysfunction, Impaired UE functional use, Increased edema  Visit Diagnosis: 1. Lymphedema of left arm   2. Other abnormalities of gait and mobility        Problem List Patient Active Problem List   Diagnosis Date Noted  . Palliative care by specialist   . DNR (do not resuscitate)   . Expressive aphasia   . Brain metastasis (HCC) 01/27/2019  . Seizures (HCC) 07/21/2018  . Macrocytic anemia 07/21/2018  . Thrombocytopenia (HCC) 07/21/2018  . Mild renal insufficiency 07/21/2018  . Anxiety 07/21/2018  . Brain metastases (HCC)   . Malignant neoplasm of female breast (HCC)   . Breast cancer metastasized to brain (HCC) 07/20/2018  . Port-A-Cath in place 07/17/2018  . Liver metastases (HCC) 07/12/2018  . Genetic testing 08/08/2017  . Palliative care status   . Counseling regarding advance care planning and goals of care 03/25/2017  . 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 Nausea without vomiting 03/21/2017  . Bone metastases (HCC) 02/01/2017  . Osseous metastasis (HCC)   . Malignant neoplasm metastatic to lung (HCC)   . Cancer associated pain   . Carcinoma of breast metastatic to brain (HCC) 01/21/2017  .  Pathologic fracture   . Breast mass, left    Teresa K. Brown, PT  Brown, Teresa Krall 05/22/2019, 5:02 PM  [facility] [address] [place], [place], [address] Phone: [phone]   Fax:  [phone]  Name: Stacey Cox MRN:   [id number] Date of Birth: 07/15/1956 PHYSICAL THERAPY DISCHARGE SUMMARY  Visits from Start of Care: 15  Current functional level related to goals / functional outcomes: unknown   Remaining deficits: unknown   Education / Equipment: Self care management of lymphedema Plan: Patient agrees to discharge.  Patient goals were partially met. Patient is being discharged due to not returning since the last visit.  ?????     Teresa Brown, PT 07/19/19 12:48 PM  

## 2019-05-24 ENCOUNTER — Ambulatory Visit: Payer: BC Managed Care – PPO | Admitting: Physical Therapy

## 2019-05-25 ENCOUNTER — Other Ambulatory Visit: Payer: Self-pay | Admitting: Internal Medicine

## 2019-05-28 ENCOUNTER — Encounter: Payer: BC Managed Care – PPO | Admitting: Physical Therapy

## 2019-05-30 ENCOUNTER — Encounter: Payer: BC Managed Care – PPO | Admitting: Physical Therapy

## 2019-05-30 NOTE — Progress Notes (Signed)
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  INTERVAL HISTORY   Stacey Cox is here for follow-up of her metastatic hormone positive HER-2 negative breast cancer. The patient's last visit with [place] was on 05/17/19. The pt reports that she is doing well overall. She is accompanied today by her friend Layla, via [employer].  The pt reports that her left arm swelling is improved but is still swollen. She notes that the redness is resolved and has been using a topical ointment. She has continued using a compression sleeve.   The pt adds that she has developed some new tingling in her feet, which has been stable since presenting in the last two weeks. She denies this being very bothersome. She denies neuropathy in her hands.  She notes that she has been feeling more fatigued recently. Her sister will be coming to stay with the pt next week and they will be looking into home care services. The pt denies leaving her home for any reason except for her doctors' visits.   She notes that she has had a low appetite since she received an injection in her gums 5 weeks ago. She went to her dentist yesterday. She denies tenderness or swelling in the gums. She has lost 4 pounds in the interim.  The pt also notes that she has been urinating more frequently. She denies abdominal pains.  Lab results today (05/31/19) of CBC w/diff and CMP is as follows: all values are WNL except for WBC at 11.2k, RDW at 19.3, PLT at 134k, nRBC at 0.9%, ANC at 9.0k, Abs immature granulocytes at 0.09k, Glucose at 373, Total Protein at 6.4, Albumin at 3.1, ALT at 70, Alk Phos at 341. 05/31/19 CA15-3 and CA27.29 are pending  On review of systems, pt reports  improved left arm swelling, tingling feet, more fatigue, lower appetite, mild weight loss, more frequent urination, and denies abdominal pains, and any other symptoms.   MEDICAL HISTORY:  Past Medical History:  Diagnosis Date  . Cancer [facility])    Metastatic Breast Cancer    SURGICAL HISTORY: Past Surgical History:  Procedure Laterality Date  . CHOLECYSTECTOMY    . IR IMAGING GUIDED PORT INSERTION  07/14/2018  . TONSILLECTOMY      SOCIAL HISTORY: Social History   Socioeconomic History  . Marital status: Single    Spouse name: Not on file  . Number of children: Not on file  . Years of education: Not on file  . Highest education level: Not on file  Occupational History  . Not on file  Social Needs  . Financial resource strain: Not on file  . Food insecurity    Worry: Not on file    Inability: Not on file  . Transportation needs    Medical: Not on file    Non-medical: Not on file  Tobacco Use  . Smoking status: Current Every Day Smoker    Packs/day: 0.15    Types: Cigarettes    Last attempt to quit: 01/21/2017    Years since quitting: 2.3  . Smokeless tobacco: Never Used  Substance and Sexual Activity  . Alcohol use: No    Alcohol/week: 0.0 standard drinks  . Drug use: No  .  Sexual activity: Never  Lifestyle  . Physical activity    Days per week: Not on file    Minutes per session: Not on file  . Stress: Not on file  Relationships  . Social Herbalist on phone: Not on file    Gets together: Not on file    Attends religious service: Not on file    Active member of club or organization: Not on file    Attends meetings of clubs or organizations: Not on file    Relationship status: Not on file  . Intimate partner violence    Fear of current or ex partner: No    Emotionally abused: No    Physically abused: No    Forced sexual activity: No  Other Topics Concern  . Not on file  Social History Narrative   Lives alone. 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 Has  phobia of the medical system.  Cigarette smoker 1/2 PPD for about 40 yrs Social alcohol use No drugs Professor at [employer] no children  FAMILY HISTORY: Family History  Problem Relation Age of Onset  . Cancer Mother        breast  . Cancer Sister        breast  Mother with h/o breast cancer in her 58's Sister with breast cancer in her 67ys and later was diagnosed with multiple myeloma. (Patient is not aware of any specific breast cancer mutations present)  ALLERGIES:  is allergic to thorazine [chlorpromazine] and ribociclib.  MEDICATIONS:  Current Outpatient Medications  Medication Sig Dispense Refill  . aspirin EC 81 MG tablet Take 1 tablet (81 mg total) by mouth daily.    . chlorhexidine (PERIDEX) 0.12 % solution Use as directed 15 mLs in the mouth or throat 2 (two) times daily as needed (mouth rinse after vomiting). Swish and spit 473 mL 1  . dexamethasone (DECADRON) 1 MG tablet TAKE 1 TABLET(1 MG) BY MOUTH DAILY 30 tablet 2  . escitalopram (LEXAPRO) 20 MG tablet TAKE 1 TABLET(20 MG) BY MOUTH DAILY 30 tablet 0  . feeding supplement, ENSURE ENLIVE, (ENSURE ENLIVE) LIQD Take 237 mLs by mouth 2 (two) times daily between meals. (Patient taking differently: Take 237 mLs by mouth daily. ) 237 mL 12  . hydrOXYzine (ATARAX/VISTARIL) 10 MG tablet Take 10 mg by mouth 2 (two) times daily.    [name] levETIRAcetam (KEPPRA) 1000 MG tablet Take 1 tablet (1,000 mg total) by mouth 2 (two) times daily. 60 tablet 5  . pantoprazole (PROTONIX) 40 MG tablet TAKE 1 TABLET(40 MG) BY MOUTH DAILY 30 tablet 1  . triamcinolone (KENALOG) 0.025 % ointment Apply 1 application topically 2 (two) times daily. Please in thin layer gently (without rubbing) over eczematoid rash over the left arm 30 g 0  . Vitamin D, Ergocalciferol, (DRISDOL) 1.25 MG (50000 UT) CAPS capsule TAKE 1 CAPSULE BY MOUTH 3 TIMES WEEKLY 24 capsule 0   No current facility-administered medications for this visit.    Facility-Administered  Medications Ordered in Other Visits  Medication Dose Route Frequency Provider Last Rate Last Dose  . denosumab (XGEVA) injection 120 mg  120 mg Subcutaneous Once [name], MD      . fulvestrant (FASLODEX) injection 500 mg  500 mg Intramuscular Once [name], MD        REVIEW OF SYSTEMS:    A 10+ POINT REVIEW OF SYSTEMS WAS OBTAINED including neurology, dermatology, psychiatry, cardiac, respiratory, lymph, extremities, GI, GU, Musculoskeletal, constitutional, breasts, reproductive,  HEENT.  All pertinent positives are noted in the HPI.  All others are negative.   PHYSICAL EXAMINATION:  ECOG PERFORMANCE STATUS: 1 - Symptomatic but completely ambulatory  Vitals:   05/31/19 1219  BP: 94/81  Pulse: (!) 106  Resp: 17  Temp: 98.7 F (37.1 C)  SpO2: 100%   Filed Weights   05/31/19 1219  Weight: 170 lb 9.6 oz (77.4 kg)   Body mass index is 28.39 kg/m.  GENERAL:alert, in no acute distress and comfortable SKIN: no acute rashes, no significant lesions EYES: conjunctiva are pink and non-injected, sclera anicteric OROPHARYNX: MMM, no exudates, no oropharyngeal erythema or ulceration NECK: supple, no JVD LYMPH:  no palpable lymphadenopathy in the cervical, axillary or inguinal regions LUNGS: clear to auscultation b/l with normal respiratory effort HEART: regular rate & rhythm ABDOMEN:  normoactive bowel sounds , non tender, not distended. No palpable hepatosplenomegaly.  Extremity: no pedal edema PSYCH: alert & oriented x 3 with fluent speech NEURO: no focal motor/sensory deficits   LABORATORY DATA:  I have reviewed the data as listed  CBC Latest Ref Rng & Units 05/31/2019 05/17/2019 05/10/2019  WBC 4.0 - 10.5 K/uL 11.2(H) 4.1 5.5  Hemoglobin 12.0 - 15.0 g/dL 13.1 12.7 11.6(L)  Hematocrit 36.0 - 46.0 % 40.3 38.2 35.8(L)  Platelets 150 - 400 K/uL 134(L) 147(L) 168   CBC    Component Value Date/Time   WBC 11.2 (H) 05/31/2019 1100   RBC 4.29 05/31/2019 1100    HGB 13.1 05/31/2019 1100   HGB 12.7 04/12/2019 0901   HGB 13.9 11/24/2017 1340   HCT 40.3 05/31/2019 1100   HCT 42.0 11/24/2017 1340   PLT 134 (L) 05/31/2019 1100   PLT 198 04/12/2019 0901   PLT 212 11/24/2017 1340   MCV 93.9 05/31/2019 1100   MCV 97.0 11/24/2017 1340   MCH 30.5 05/31/2019 1100   MCHC 32.5 05/31/2019 1100   RDW 19.3 (H) 05/31/2019 1100   RDW 14.1 11/24/2017 1340   LYMPHSABS 1.2 05/31/2019 1100   LYMPHSABS 1.4 11/24/2017 1340   MONOABS 0.8 05/31/2019 1100   MONOABS 0.3 11/24/2017 1340   EOSABS 0.1 05/31/2019 1100   EOSABS 0.1 11/24/2017 1340   BASOSABS 0.1 05/31/2019 1100   BASOSABS 0.1 11/24/2017 1340    . CMP Latest Ref Rng & Units 05/31/2019 05/17/2019 05/10/2019  Glucose 70 - 99 mg/dL 373(H) 309(H) 278(H)  BUN 8 - 23 mg/dL _0 Creatinine 0.44 - 1.00 mg/dL 0.87 0.83 0.76  Sodium 135 - 145 mmol/L 137 136 136  Potassium 3.5 - 5.1 mmol/L 3.8 3.9 3.6  Chloride 98 - 111 mmol/L 100 99 100  CO2 22 - 32 mmol/L _1 Calcium 8.9 - 10.3 mg/dL 8.9 8.9 9.2  Total Protein 6.5 - 8.1 g/dL 6.4(L) 6.7 6.5  Total Bilirubin 0.3 - 1.2 mg/dL 0.7 0.7 0.5  Alkaline Phos 38 - 126 U/L 341(H) 303(H) 356(H)  AST 15 - 41 U/L 36 60(H) 55(H)  ALT 0 - 44 U/L 70(H) 105(H) 109(H)   07/18/18 Pathology:          RADIOGRAPHIC STUDIES: I have personally reviewed the radiological images as listed and agreed with the findings in the report. No results found.  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 Multifocal tumor in the left breast with biopsy-proven left axillary metastases.   Noted to have extensive bone  metastases and pulmonary metastases. Patient was noted to have calvarial metastases but no overt parenchymal metastasis 09/14/17 CT Chest/ad/pelvis Results discussed in details - good response.  02/22/18 CT revealed  Newly apparent 2.1 by 2.0 cm rim  enhancing lesion in the right hepatic lobe suspicious for a metastatic lesion. This was not apparent on prior exams although the prior exams were all noncontrast and thus the lesion may have been present but with reduced conspicuity. There is also a small enhancing lesion further posteriorly in the right hepatic lobe which is technically nonspecific and could be a small hemangioma or a small metastatic lesion. Stable distribution and appearance of prior sclerotic osseous metastatic disease without bony progression. 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   07/06/18 CT C/A/P which revealed New pattern of heterogeneous enhancement and enhancing ill-defined lesions in the central LEFT and RIGHT hepatic lobe at site prior enhancing lesion is highly concerning for progression of of infiltrative malignancy in the liver occupying a large portion of the RIGHT hepatic lobe and central LEFT hepatic lobe. 2. Small amount of free fluid in the abdomen pelvis. 3. No metastatic adenopathy.4. No evidence pulmonary metastasis or lymphadenopathy. 5. Stable dense sclerotic skeletal metastasis   07/11/18 ECHO revealed LV EF of 55%-60%   07/18/18 Liver needle/core biopsy right lesion consi with Ki67 at 40%, ER/PR +ve   07/28/18 MRI Abdomen MRCP revealed Extensive hepatic metastatic disease, possibly progressive from recent abdominal CT. 2. No evidence of intrahepatic or extrahepatic biliary dilatation. Elevated liver function studies may be secondary to intrahepatic cholestasis. 3. Increased ascites without peritoneal nodularity or suspicious enhancement. 4. Grossly stable widespread osseous metastatic disease.   S/p 6 cycles of AC completed on 11/01/18  03/20/19 CT C/A/P revealed "Although the dominant liver lesion appears improved in the interval, there appears to be 2 new liver metastases on today's exam, concerning for disease progression. 2.  Similar appearance of the widespread sclerotic bone metastases. 3. Interval decrease in ascites. 4. Aortic Atherosclerois."  Pt's planned third weekly infusion of Taxol was cancelled due to dental abscess s/p antibiotic course. Then cancelled again due to thrombocytopenia thought to be due to antibiotic course.  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 Much improved back pain.  # 3 Neutropenia Related to her Ribociclib - resolved. Patient is currently on Verzenio and has not developed any neutropenia. This is being monitored.  #4 Increased LFTs due to new liver metastases  #5 Cancer related pain - controlled - status post palliative RT to spine - no currently needing any pain medications.  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 No other new medication. Now resolved. Monitoring on increasing doses of Verzenio. No issues with recurrent rash on Verzenio 121m po BID  #7 Brain metastasis  07/20/18 CT Head revealed 1.8 x 2.4 x 3.9 cm LEFT frontal cortical based mass with possible dural component highly concerning for metastatic disease, less likely abscess or cerebritis. Severe vasogenic edema with 5 mm LEFT to RIGHT subfalcine herniation. No ventricular entrapment. 2. 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 3. Multiple calvarial metastasis. 4. Cerebellar atrophy.    07/21/18 MRI Brain revealed  Irregular plaque-like mass, likely metastasis, measuring up to 5.1 cm, appears to originate from the left anterolateral frontal dura with invasion of the underlying frontal lobe of the brain. Mass effect from brain edema  and the lesion partially effaces the frontal horn of left lateral ventricle and results in 7 mm left-to-right midline shift of anterior septum pellucidum. 2. Diffuse dural thickening over the left cerebral convexity may represent edema associated with the tumor or invasive neoplasm. 3. Multiple sclerotic bony  metastasis of the calvarium. 4. Asymmetric cerebellar atrophy.   07/28/18 MRI Brain revealed Extensive nodular dural enhancement left frontal lobe with associated enhancing mass growing in the left frontal lobe is unchanged in size and compatible with metastatic disease. This is associated with metastatic disease to the left frontal bone. Extensive edema in the left frontal lobe has progressed in the interval. Blastic metastatic disease throughout the calvarium. Metastatic disease in the cervical spine. Enhancing mass along the floor of the posterior fossa on the right is stable and most consistent with meningioma. 6 mm enhancing mass in the prepontine cistern on the right also unchanged and compatible with meningioma.   s/p SRS radiation therapy  10/23/18 MRI Brain revealed SRS protocol demonstrating treatment response: Regression of LEFT frontal dural metastasis, decreased vasogenic edema and parenchymal invasion. Similar calvarial metastasis. 2. Stable appearance of 2 small posterior fossa meningiomas. 3. No acute intracranial process.   02/02/19 MRI Brain revealed "Marked progression of dural disease left greater than right as described above. Pronounced brain edema, left more than right with left-to-right shift of 1 cm. 2. I do not identify any brain parenchymal metastases. 3. Small stable incidental meningiomas in the posterior fossa as above."  Pt is s/p SRS for dural metastases.  PLAN:  -Continue follow up with Dr. Mickeal Skinner in Neuro-oncology on 02/06/2019 and Dr. Tammi Klippel in [place] with Delton See every 4 weeks   #8 Vitamin D deficiency Vit D levels improved to 44.8  On 12/01/2018 -continue Vit D replacement.  #9 h/o Left upper extremity swelling- [place] neg for DVT - now swelling resolved.  PLAN:  -Discussed pt labwork today, 05/31/19; liver enzymes improved. Glucose high at 373. Blood counts stable overall.  -05/31/19 CA15-3 and CA27.29 are pending. Last available 03/19/19 CA15-3 was at 62.6  and 03/19/19 CA27.29 was at 84.1 -Hyperglycemia could be related to her pre-treatment steroids prior to Taxol infusions. Will be more permissive with sugars in general given context but are certainly too high right now. -Will decrease steroid dose  -Will connect pt with our PA Sandi Mealy for management of hyperglycemia while pt awaits establishing care with her PCP -Will order insulin prior to infusion today if glucose remains above 300, and then check sugars again in 2 hours -Will recommend checking sugars with home health services this week -Will refer the pt to home health services and an in-home nursing evaluation. -The pt has no prohibitive toxicities from continuing C3D1 Taxol at this time. -Grade 1 neuropathy at this point, will continue to monitor.  -Recommend beginning a Vitamin B complex -Proceed with 06/07/19 MRI Brain -Holding Xgeva due to recent dental concerns -Continue care with outpatient lymphedema PT clinic for left arm lymphedema next week on 04/10/19. -Continue Keppra -Continue tapering dexamethasone as per Dr Mickeal Skinner - currently on 58m daily -Continue 20MEQ Potassium PO BID replacement  -Continue Ergocalciferol 50k units, three times a week, with fatty foods to aide absorption -Continue Faslodex every 4 weeks -Will see the pt back in one week   -RTC as per scheduled appointments for 06/07/2019 -Plz add MD visit to 06/07/2019   All of the patients questions were answered with apparent satisfaction. The patient knows to call the clinic with  any problems, questions or concerns.   The total time spent in the appt was 25 minutes and more than 50% was on counseling and direct patient cares.    Sullivan Lone MD [place] AAHIVMS [facility] Hematology/Oncology Physician [facility]  (Office):       [phone] (Work cell):  [phone] (Fax):           [phone]  I, Baldwin Jamaica, am acting as a scribe for Dr. Sullivan Lone.   .I have reviewed the above  documentation for accuracy and completeness, and I agree with the above. [name] MD

## 2019-05-31 ENCOUNTER — Inpatient Hospital Stay: Payer: BC Managed Care – PPO | Attending: Hematology

## 2019-05-31 ENCOUNTER — Inpatient Hospital Stay (HOSPITAL_BASED_OUTPATIENT_CLINIC_OR_DEPARTMENT_OTHER): Payer: BC Managed Care – PPO | Admitting: Hematology

## 2019-05-31 ENCOUNTER — Inpatient Hospital Stay: Payer: BC Managed Care – PPO

## 2019-05-31 ENCOUNTER — Other Ambulatory Visit: Payer: Self-pay

## 2019-05-31 ENCOUNTER — Telehealth: Payer: Self-pay | Admitting: Hematology

## 2019-05-31 VITALS — HR 90

## 2019-05-31 VITALS — BP 94/81 | HR 106 | Temp 98.7°F | Resp 17 | Ht 65.0 in | Wt 170.6 lb

## 2019-05-31 DIAGNOSIS — C7951 Secondary malignant neoplasm of bone: Secondary | ICD-10-CM | POA: Diagnosis not present

## 2019-05-31 DIAGNOSIS — Z5189 Encounter for other specified aftercare: Secondary | ICD-10-CM | POA: Insufficient documentation

## 2019-05-31 DIAGNOSIS — C787 Secondary malignant neoplasm of liver and intrahepatic bile duct: Secondary | ICD-10-CM

## 2019-05-31 DIAGNOSIS — Z7189 Other specified counseling: Secondary | ICD-10-CM

## 2019-05-31 DIAGNOSIS — C50919 Malignant neoplasm of unspecified site of unspecified female breast: Secondary | ICD-10-CM

## 2019-05-31 DIAGNOSIS — R627 Adult failure to thrive: Secondary | ICD-10-CM

## 2019-05-31 DIAGNOSIS — Z95828 Presence of other vascular implants and grafts: Secondary | ICD-10-CM

## 2019-05-31 DIAGNOSIS — G893 Neoplasm related pain (acute) (chronic): Secondary | ICD-10-CM | POA: Insufficient documentation

## 2019-05-31 DIAGNOSIS — Z5111 Encounter for antineoplastic chemotherapy: Secondary | ICD-10-CM | POA: Insufficient documentation

## 2019-05-31 DIAGNOSIS — C50812 Malignant neoplasm of overlapping sites of left female breast: Secondary | ICD-10-CM | POA: Insufficient documentation

## 2019-05-31 DIAGNOSIS — I890 Lymphedema, not elsewhere classified: Secondary | ICD-10-CM | POA: Insufficient documentation

## 2019-05-31 DIAGNOSIS — C7800 Secondary malignant neoplasm of unspecified lung: Secondary | ICD-10-CM

## 2019-05-31 DIAGNOSIS — E559 Vitamin D deficiency, unspecified: Secondary | ICD-10-CM

## 2019-05-31 DIAGNOSIS — C7931 Secondary malignant neoplasm of brain: Secondary | ICD-10-CM

## 2019-05-31 DIAGNOSIS — Z170 Estrogen receptor positive status [ER+]: Secondary | ICD-10-CM

## 2019-05-31 DIAGNOSIS — G629 Polyneuropathy, unspecified: Secondary | ICD-10-CM

## 2019-05-31 DIAGNOSIS — R222 Localized swelling, mass and lump, trunk: Secondary | ICD-10-CM | POA: Insufficient documentation

## 2019-05-31 DIAGNOSIS — R739 Hyperglycemia, unspecified: Secondary | ICD-10-CM | POA: Insufficient documentation

## 2019-05-31 LAB — CBC WITH DIFFERENTIAL/PLATELET
Abs Immature Granulocytes: 0.09 10*3/uL — ABNORMAL HIGH (ref 0.00–0.07)
Basophils Absolute: 0.1 10*3/uL (ref 0.0–0.1)
Basophils Relative: 1 %
Eosinophils Absolute: 0.1 10*3/uL (ref 0.0–0.5)
Eosinophils Relative: 0 %
HCT: 40.3 % (ref 36.0–46.0)
Hemoglobin: 13.1 g/dL (ref 12.0–15.0)
Immature Granulocytes: 1 %
Lymphocytes Relative: 11 %
Lymphs Abs: 1.2 10*3/uL (ref 0.7–4.0)
MCH: 30.5 pg (ref 26.0–34.0)
MCHC: 32.5 g/dL (ref 30.0–36.0)
MCV: 93.9 fL (ref 80.0–100.0)
Monocytes Absolute: 0.8 10*3/uL (ref 0.1–1.0)
Monocytes Relative: 7 %
Neutro Abs: 9 10*3/uL — ABNORMAL HIGH (ref 1.7–7.7)
Neutrophils Relative %: 80 %
Platelets: 134 10*3/uL — ABNORMAL LOW (ref 150–400)
RBC: 4.29 MIL/uL (ref 3.87–5.11)
RDW: 19.3 % — ABNORMAL HIGH (ref 11.5–15.5)
WBC: 11.2 10*3/uL — ABNORMAL HIGH (ref 4.0–10.5)
nRBC: 0.9 % — ABNORMAL HIGH (ref 0.0–0.2)

## 2019-05-31 LAB — CMP (CANCER CENTER ONLY)
ALT: 70 U/L — ABNORMAL HIGH (ref 0–44)
AST: 36 U/L (ref 15–41)
Albumin: 3.1 g/dL — ABNORMAL LOW (ref 3.5–5.0)
Alkaline Phosphatase: 341 U/L — ABNORMAL HIGH (ref 38–126)
Anion gap: 11 (ref 5–15)
BUN: 15 mg/dL (ref 8–23)
CO2: 26 mmol/L (ref 22–32)
Calcium: 8.9 mg/dL (ref 8.9–10.3)
Chloride: 100 mmol/L (ref 98–111)
Creatinine: 0.87 mg/dL (ref 0.44–1.00)
GFR, Est AFR Am: >60 mL/min (ref >60)
GFR, Estimated: >60 mL/min (ref >60)
Glucose, Bld: 373 mg/dL — ABNORMAL HIGH (ref 70–99)
Potassium: 3.8 mmol/L (ref 3.5–5.1)
Sodium: 137 mmol/L (ref 135–145)
Total Bilirubin: 0.7 mg/dL (ref 0.3–1.2)
Total Protein: 6.4 g/dL — ABNORMAL LOW (ref 6.5–8.1)

## 2019-05-31 MED ORDER — HEPARIN SOD (PORK) LOCK FLUSH 100 UNIT/ML IV SOLN
500.0000 [IU] | Freq: Once | INTRAVENOUS | Status: AC | PRN
  Administered 2019-05-31: 500 [IU]
  Filled 2019-05-31: qty 5

## 2019-05-31 MED ORDER — SODIUM CHLORIDE 0.9 % IV SOLN
80.0000 mg/m2 | Freq: Once | INTRAVENOUS | Status: AC
  Administered 2019-05-31: 15:00:00 150 mg via INTRAVENOUS
  Filled 2019-05-31: qty 25

## 2019-05-31 MED ORDER — DIPHENHYDRAMINE HCL 50 MG/ML IJ SOLN
50.0000 mg | Freq: Once | INTRAMUSCULAR | Status: AC
  Administered 2019-05-31: 50 mg via INTRAVENOUS

## 2019-05-31 MED ORDER — DIPHENHYDRAMINE HCL 50 MG/ML IJ SOLN
INTRAMUSCULAR | Status: AC
  Filled 2019-05-31: qty 1

## 2019-05-31 MED ORDER — SODIUM CHLORIDE 0.9 % IV SOLN
Freq: Once | INTRAVENOUS | Status: AC
  Administered 2019-05-31: 14:00:00 via INTRAVENOUS
  Filled 2019-05-31: qty 250

## 2019-05-31 MED ORDER — INSULIN REGULAR HUMAN 100 UNIT/ML IJ SOLN
8.0000 [IU] | Freq: Once | INTRAMUSCULAR | Status: AC
  Administered 2019-05-31: 8 [IU] via SUBCUTANEOUS
  Filled 2019-05-31: qty 10

## 2019-05-31 MED ORDER — SODIUM CHLORIDE 0.9 % IV SOLN
Freq: Once | INTRAVENOUS | Status: AC
  Administered 2019-05-31: 14:00:00 via INTRAVENOUS
  Filled 2019-05-31: qty 6

## 2019-05-31 MED ORDER — SODIUM CHLORIDE 0.9% FLUSH
10.0000 mL | INTRAVENOUS | Status: DC | PRN
  Administered 2019-05-31: 10 mL
  Filled 2019-05-31: qty 10

## 2019-05-31 MED ORDER — SODIUM CHLORIDE 0.9% FLUSH
10.0000 mL | Freq: Once | INTRAVENOUS | Status: AC
  Administered 2019-05-31: 10 mL
  Filled 2019-05-31: qty 10

## 2019-05-31 MED ORDER — FAMOTIDINE IN NACL 20-0.9 MG/50ML-% IV SOLN
INTRAVENOUS | Status: AC
  Filled 2019-05-31: qty 50

## 2019-05-31 MED ORDER — FAMOTIDINE IN NACL 20-0.9 MG/50ML-% IV SOLN
20.0000 mg | Freq: Once | INTRAVENOUS | Status: AC
  Administered 2019-05-31: 20 mg via INTRAVENOUS

## 2019-05-31 NOTE — Patient Instructions (Signed)
[facility] Discharge Instructions for Patients Receiving Chemotherapy  Today you received the following chemotherapy agents: Taxol  To help prevent nausea and vomiting after your treatment, we encourage you to take your nausea medication as directed.    If you develop nausea and vomiting that is not controlled by your nausea medication, call the clinic.   BELOW ARE SYMPTOMS THAT SHOULD BE REPORTED IMMEDIATELY:  *FEVER GREATER THAN 100.5 F  *CHILLS WITH OR WITHOUT FEVER  NAUSEA AND VOMITING THAT IS NOT CONTROLLED WITH YOUR NAUSEA MEDICATION  *UNUSUAL SHORTNESS OF BREATH  *UNUSUAL BRUISING OR BLEEDING  TENDERNESS IN MOUTH AND THROAT WITH OR WITHOUT PRESENCE OF ULCERS  *URINARY PROBLEMS  *BOWEL PROBLEMS  UNUSUAL RASH Items with * indicate a potential emergency and should be followed up as soon as possible.  Feel free to call the clinic should you have any questions or concerns. The clinic phone number is [phone].  Please show the [place] at check-in to the Emergency Department and triage nurse.  Coronavirus (COVID-19) Are you at risk?  Are you at risk for the Coronavirus (COVID-19)?  To be considered HIGH RISK for Coronavirus (COVID-19), you have to meet the following criteria:  . Traveled to [place], [place], [place], [place] or [place]; or in the [place] to [place], [place], [place], or [place]; and have fever, cough, and shortness of breath within the last 2 weeks of travel OR . Been in close contact with a person diagnosed with COVID-19 within the last 2 weeks and have fever, cough, and shortness of breath . IF YOU DO NOT MEET THESE CRITERIA, YOU ARE CONSIDERED LOW RISK FOR COVID-19.  What to do if you are HIGH RISK for COVID-19?  [name] If you are having a medical emergency, call 911. . Seek medical care right away. Before you go to a doctor's office, urgent care or emergency department, call ahead and tell them about your recent  travel, contact with someone diagnosed with COVID-19, and your symptoms. You should receive instructions from your physician's office regarding next steps of care.  . When you arrive at healthcare provider, tell the healthcare staff immediately you have returned from visiting [place], [place], [place], [place] or [place]; or traveled in the [place] to [place], [place], [place], or [place]; in the last two weeks or you have been in close contact with a person diagnosed with COVID-19 in the last 2 weeks.   . Tell the health care staff about your symptoms: fever, cough and shortness of breath. . After you have been seen by a medical provider, you will be either: o Tested for (COVID-19) and discharged home on quarantine except to seek medical care if symptoms worsen, and asked to  - Stay home and avoid contact with others until you get your results (4-5 days)  - Avoid travel on public transportation if possible (such as bus, train, or airplane) or o Sent to the Emergency Department by EMS for evaluation, COVID-19 testing, and possible admission depending on your condition and test results.  What to do if you are LOW RISK for COVID-19?  Reduce your risk of any infection by using the same precautions used for avoiding the common cold or flu:  [name] Wash your hands often with soap and warm water for at least 20 seconds.  If soap and water are not readily available, use an alcohol-based hand sanitizer with at least 60% alcohol.  . If coughing or  sneezing, cover your mouth and nose by coughing or sneezing into the elbow areas of your shirt or coat, into a tissue or into your sleeve (not your hands). . Avoid shaking hands with others and consider head nods or verbal greetings only. . Avoid touching your eyes, nose, or mouth with unwashed hands.  . Avoid close contact with people who are sick. . Avoid places or events with large numbers of people in one location, like concerts or sporting  events. . Carefully consider travel plans you have or are making. . If you are planning any travel outside or inside the [place], visit the CDC's Travelers' Health webpage for the latest health notices. . If you have some symptoms but not all symptoms, continue to monitor at home and seek medical attention if your symptoms worsen. . If you are having a medical emergency, call 911.   [place] / e-Visit: [email or web address]         [facility]: [facility]: [phone]                   [facility]: [phone]

## 2019-05-31 NOTE — Progress Notes (Signed)
1615 CBG 225.  [place] for discharge per Dr. Irene Limbo.  No further instructions.

## 2019-05-31 NOTE — Progress Notes (Signed)
Recheck of FSBS= 374. Reported to Dr. Irene Limbo. Awaiting orders to be placed.

## 2019-05-31 NOTE — Telephone Encounter (Signed)
Scheduled appt per 7/9 los. °

## 2019-06-01 LAB — CANCER ANTIGEN 15-3: CA 15-3: 50.9 U/mL — ABNORMAL HIGH (ref 0.0–25.0)

## 2019-06-01 LAB — GLUCOSE, CAPILLARY
Glucose-Capillary: 374 mg/dL — ABNORMAL HIGH (ref 70–99)
Glucose-Capillary: 225 mg/dL — ABNORMAL HIGH (ref 70–99)

## 2019-06-01 LAB — CANCER ANTIGEN 27.29: CA 27.29: 68.8 U/mL — ABNORMAL HIGH (ref 0.0–38.6)

## 2019-06-04 ENCOUNTER — Other Ambulatory Visit: Payer: Self-pay | Admitting: Hematology

## 2019-06-04 ENCOUNTER — Telehealth: Payer: Self-pay | Admitting: *Deleted

## 2019-06-04 NOTE — Telephone Encounter (Signed)
Patient called. Asked if she can restart outpatient lymphedema massage and resume using self massager. She said if Dr. Irene Limbo ok'd the return, please contact [facility] Maudry Diego, PT) @ [phone] to notify them. Message sent to Dr. Irene Limbo.

## 2019-06-04 NOTE — Telephone Encounter (Signed)
Per Dr. Irene Limbo - he has ordered Home Health to evaluate the patient for home care assistance. Will need to have PT in the home instead of OP. May use self massager at this time. Notified patient that [facility] has been contacted to set up evaluation for home care, PT, OT. [contacted [facility] with referral] Sister Henrene Hawking (currently visiting patient) asked what type of services her sister will have as she may want to supplement sevices. Advised her that once patient is evaluated, Home Health will provide list of services. She verbalized understanding.

## 2019-06-04 NOTE — Telephone Encounter (Signed)
Opened in error

## 2019-06-05 ENCOUNTER — Telehealth: Payer: Self-pay | Admitting: *Deleted

## 2019-06-05 NOTE — Telephone Encounter (Signed)
Patient referred to [facility] 7/13 for home care. Contacted by Santiago Glad with [facility] - they are not accepting patient's insurance ([place] [employer]BS) at this time and will not accept patient.  Contacted patient and asked if she would contact her insurance and ask which [place] agency they want her to use and let this office know so that referral can proceed. Patient agreed and states she will contact office once she knows which agency her insurance wants her to use.

## 2019-06-06 ENCOUNTER — Other Ambulatory Visit: Payer: Self-pay | Admitting: Radiation Therapy

## 2019-06-06 ENCOUNTER — Other Ambulatory Visit: Payer: Self-pay

## 2019-06-06 NOTE — Progress Notes (Signed)
Error

## 2019-06-07 ENCOUNTER — Inpatient Hospital Stay: Payer: BC Managed Care – PPO

## 2019-06-07 ENCOUNTER — Inpatient Hospital Stay (HOSPITAL_BASED_OUTPATIENT_CLINIC_OR_DEPARTMENT_OTHER): Payer: BC Managed Care – PPO | Admitting: Hematology

## 2019-06-07 ENCOUNTER — Other Ambulatory Visit: Payer: Self-pay

## 2019-06-07 ENCOUNTER — Ambulatory Visit
Admission: RE | Admit: 2019-06-07 | Discharge: 2019-06-07 | Disposition: A | Discharge status: Home / Self Care | Payer: BC Managed Care – PPO | Source: Ambulatory Visit | Attending: Radiation Oncology | Admitting: Radiation Oncology

## 2019-06-07 ENCOUNTER — Telehealth: Payer: Self-pay | Admitting: *Deleted

## 2019-06-07 ENCOUNTER — Other Ambulatory Visit: Payer: BC Managed Care – PPO

## 2019-06-07 VITALS — BP 106/77 | HR 100 | Temp 99.0°F | Resp 18 | Ht 65.0 in | Wt 171.5 lb

## 2019-06-07 DIAGNOSIS — Z7189 Other specified counseling: Secondary | ICD-10-CM

## 2019-06-07 DIAGNOSIS — C7931 Secondary malignant neoplasm of brain: Secondary | ICD-10-CM

## 2019-06-07 DIAGNOSIS — E559 Vitamin D deficiency, unspecified: Secondary | ICD-10-CM

## 2019-06-07 DIAGNOSIS — G629 Polyneuropathy, unspecified: Secondary | ICD-10-CM

## 2019-06-07 DIAGNOSIS — R222 Localized swelling, mass and lump, trunk: Secondary | ICD-10-CM

## 2019-06-07 DIAGNOSIS — C50812 Malignant neoplasm of overlapping sites of left female breast: Secondary | ICD-10-CM | POA: Diagnosis not present

## 2019-06-07 DIAGNOSIS — C787 Secondary malignant neoplasm of liver and intrahepatic bile duct: Secondary | ICD-10-CM

## 2019-06-07 DIAGNOSIS — C7800 Secondary malignant neoplasm of unspecified lung: Secondary | ICD-10-CM

## 2019-06-07 DIAGNOSIS — Z170 Estrogen receptor positive status [ER+]: Secondary | ICD-10-CM

## 2019-06-07 DIAGNOSIS — R739 Hyperglycemia, unspecified: Secondary | ICD-10-CM

## 2019-06-07 DIAGNOSIS — C50919 Malignant neoplasm of unspecified site of unspecified female breast: Secondary | ICD-10-CM

## 2019-06-07 DIAGNOSIS — I890 Lymphedema, not elsewhere classified: Secondary | ICD-10-CM

## 2019-06-07 DIAGNOSIS — C7949 Secondary malignant neoplasm of other parts of nervous system: Secondary | ICD-10-CM

## 2019-06-07 DIAGNOSIS — G893 Neoplasm related pain (acute) (chronic): Secondary | ICD-10-CM

## 2019-06-07 DIAGNOSIS — C7951 Secondary malignant neoplasm of bone: Secondary | ICD-10-CM

## 2019-06-07 DIAGNOSIS — Z95828 Presence of other vascular implants and grafts: Secondary | ICD-10-CM

## 2019-06-07 DIAGNOSIS — Z5111 Encounter for antineoplastic chemotherapy: Secondary | ICD-10-CM | POA: Diagnosis not present

## 2019-06-07 LAB — CMP (CANCER CENTER ONLY)
ALT: 76 U/L — ABNORMAL HIGH (ref 0–44)
AST: 50 U/L — ABNORMAL HIGH (ref 15–41)
Albumin: 3.2 g/dL — ABNORMAL LOW (ref 3.5–5.0)
Alkaline Phosphatase: 258 U/L — ABNORMAL HIGH (ref 38–126)
Anion gap: 13 (ref 5–15)
BUN: 17 mg/dL (ref 8–23)
CO2: 25 mmol/L (ref 22–32)
Calcium: 9.4 mg/dL (ref 8.9–10.3)
Chloride: 98 mmol/L (ref 98–111)
Creatinine: 0.8 mg/dL (ref 0.44–1.00)
GFR, Est AFR Am: >60 mL/min (ref >60)
GFR, Estimated: >60 mL/min (ref >60)
Glucose, Bld: 355 mg/dL — ABNORMAL HIGH (ref 70–99)
Potassium: 3.5 mmol/L (ref 3.5–5.1)
Sodium: 136 mmol/L (ref 135–145)
Total Bilirubin: 0.5 mg/dL (ref 0.3–1.2)
Total Protein: 6.4 g/dL — ABNORMAL LOW (ref 6.5–8.1)

## 2019-06-07 LAB — CBC WITH DIFFERENTIAL/PLATELET
Abs Immature Granulocytes: 0.12 10*3/uL — ABNORMAL HIGH (ref 0.00–0.07)
Basophils Absolute: 0.1 10*3/uL (ref 0.0–0.1)
Basophils Relative: 1 %
Eosinophils Absolute: 0 10*3/uL (ref 0.0–0.5)
Eosinophils Relative: 1 %
HCT: 36.5 % (ref 36.0–46.0)
Hemoglobin: 12 g/dL (ref 12.0–15.0)
Immature Granulocytes: 2 %
Lymphocytes Relative: 20 %
Lymphs Abs: 1.2 10*3/uL (ref 0.7–4.0)
MCH: 30.5 pg (ref 26.0–34.0)
MCHC: 32.9 g/dL (ref 30.0–36.0)
MCV: 92.9 fL (ref 80.0–100.0)
Monocytes Absolute: 0.5 10*3/uL (ref 0.1–1.0)
Monocytes Relative: 7 %
Neutro Abs: 4.4 10*3/uL (ref 1.7–7.7)
Neutrophils Relative %: 69 %
Platelets: 200 10*3/uL (ref 150–400)
RBC: 3.93 MIL/uL (ref 3.87–5.11)
RDW: 17.3 % — ABNORMAL HIGH (ref 11.5–15.5)
WBC: 6.3 10*3/uL (ref 4.0–10.5)
nRBC: 0.8 % — ABNORMAL HIGH (ref 0.0–0.2)

## 2019-06-07 LAB — GLUCOSE, CAPILLARY
Glucose-Capillary: 243 mg/dL — ABNORMAL HIGH (ref 70–99)
Glucose-Capillary: 287 mg/dL — ABNORMAL HIGH (ref 70–99)

## 2019-06-07 MED ORDER — SODIUM CHLORIDE 0.9 % IV SOLN
80.0000 mg/m2 | Freq: Once | INTRAVENOUS | Status: AC
  Administered 2019-06-07: 11:00:00 150 mg via INTRAVENOUS
  Filled 2019-06-07: qty 25

## 2019-06-07 MED ORDER — INSULIN REGULAR HUMAN 100 UNIT/ML IJ SOLN
4.0000 [IU] | Freq: Once | INTRAMUSCULAR | Status: AC
  Administered 2019-06-07: 4 [IU] via SUBCUTANEOUS
  Filled 2019-06-07: qty 10

## 2019-06-07 MED ORDER — SODIUM CHLORIDE 0.9 % IV SOLN
Freq: Once | INTRAVENOUS | Status: AC
  Administered 2019-06-07: 10:00:00 via INTRAVENOUS
  Filled 2019-06-07: qty 6

## 2019-06-07 MED ORDER — SODIUM CHLORIDE 0.9% FLUSH
10.0000 mL | Freq: Once | INTRAVENOUS | Status: AC
  Administered 2019-06-07: 15:00:00 10 mL
  Filled 2019-06-07: qty 10

## 2019-06-07 MED ORDER — DIPHENHYDRAMINE HCL 50 MG/ML IJ SOLN
INTRAMUSCULAR | Status: AC
  Filled 2019-06-07: qty 1

## 2019-06-07 MED ORDER — DIPHENHYDRAMINE HCL 50 MG/ML IJ SOLN
50.0000 mg | Freq: Once | INTRAMUSCULAR | Status: AC
  Administered 2019-06-07: 50 mg via INTRAVENOUS

## 2019-06-07 MED ORDER — HEPARIN SOD (PORK) LOCK FLUSH 100 UNIT/ML IV SOLN
500.0000 [IU] | Freq: Once | INTRAVENOUS | Status: AC
  Administered 2019-06-07: 500 [IU]
  Filled 2019-06-07: qty 5

## 2019-06-07 MED ORDER — FULVESTRANT 250 MG/5ML IM SOLN
INTRAMUSCULAR | Status: AC
  Filled 2019-06-07: qty 5

## 2019-06-07 MED ORDER — SODIUM CHLORIDE 0.9% FLUSH
10.0000 mL | INTRAVENOUS | Status: DC | PRN
  Filled 2019-06-07: qty 10

## 2019-06-07 MED ORDER — HEPARIN SOD (PORK) LOCK FLUSH 100 UNIT/ML IV SOLN
500.0000 [IU] | Freq: Once | INTRAVENOUS | Status: DC | PRN
  Filled 2019-06-07: qty 5

## 2019-06-07 MED ORDER — FAMOTIDINE IN NACL 20-0.9 MG/50ML-% IV SOLN
20.0000 mg | Freq: Once | INTRAVENOUS | Status: AC
  Administered 2019-06-07: 20 mg via INTRAVENOUS

## 2019-06-07 MED ORDER — SODIUM CHLORIDE 0.9 % IV SOLN
Freq: Once | INTRAVENOUS | Status: AC
  Administered 2019-06-07: 10:00:00 via INTRAVENOUS
  Filled 2019-06-07: qty 250

## 2019-06-07 MED ORDER — FAMOTIDINE IN NACL 20-0.9 MG/50ML-% IV SOLN
INTRAVENOUS | Status: AC
  Filled 2019-06-07: qty 50

## 2019-06-07 MED ORDER — FULVESTRANT 250 MG/5ML IM SOLN
500.0000 mg | Freq: Once | INTRAMUSCULAR | Status: AC
  Administered 2019-06-07: 500 mg via INTRAMUSCULAR

## 2019-06-07 MED ORDER — GADOBENATE DIMEGLUMINE 529 MG/ML IV SOLN
15.0000 mL | Freq: Once | INTRAVENOUS | Status: AC | PRN
  Administered 2019-06-07: 15 mL via INTRAVENOUS

## 2019-06-07 NOTE — Patient Instructions (Signed)
Tunneled Central Venous Catheter Flushing Guide  It is important to flush your tunneled central venous catheter each time you use it, both before and after you use it. Flushing your catheter will help prevent it from clogging. What are the risks? Risks may include:  Infection.  Air getting into the catheter and bloodstream. Supplies needed:  A clean pair of gloves.  A disinfecting wipe. Use an alcohol wipe, chlorhexidine wipe, or iodine wipe as told by your health care provider.  A 10 mL syringe that has been prefilled with saline solution.  An empty 10 mL syringe, if a substance called heparin was injected into your catheter. How to flush your catheter When you flush your catheter, make sure you follow any specific instructions from your health care provider or the manufacturer. These are general guidelines. Flushing your catheter before use If there is heparin in your catheter: 1. Wash your hands with soap and water. 2. Put on gloves. 3. Scrub the injection cap for a minimum of 15 seconds with a disinfecting wipe. 4. Unclamp the catheter. 5. Attach the empty syringe to the injection cap. 6. Pull the syringe plunger back and withdraw 10 mL of blood. 7. Place the syringe into an appropriate waste container. 8. Scrub the injection cap for 15 seconds with a disinfecting wipe. 9. Attach the prefilled syringe to the injection cap. 10. Flush the catheter by pushing the plunger forward until all the liquid from the syringe is in the catheter. 11. Remove the syringe from the injection cap. 12. Clamp the catheter. If there is no heparin in your catheter: 1. Wash your hands with soap and water. 2. Put on gloves. 3. Scrub the injection cap for 15 seconds with a disinfecting wipe. 4. Unclamp the catheter. 5. Attach the prefilled syringe to the injection cap. 6. Flush the catheter by pushing the plunger forward until 5 mL of the liquid from the syringe is in the catheter. 7. Pull back on  the syringe until you see blood in the catheter. 8. If you have been asked to collect any blood, follow your health care provider's instructions. Otherwise, flush the catheter with the rest of the solution from the syringe. 9. Remove the syringe from the injection cap. 10. Clamp the catheter.  Flushing your catheter after use 1. Wash your hands with soap and water. 2. Put on gloves. 3. Scrub the injection cap for 15 seconds with a disinfecting wipe. 4. Unclamp the catheter. 5. Attach the prefilled syringe to the injection cap. 6. Flush the catheter by pushing the plunger forward until all of the liquid from the syringe is in the catheter. 7. Remove the syringe from the injection cap. 8. Clamp the catheter. Problems and solutions  If blood cannot be completely cleared from the injection cap, you may need to have the injection cap replaced.  If the catheter is difficult to flush, use the pulsing method. The pulsing method involves pushing only a few milliliters of solution into the catheter at a time and pausing between pushes.  If you do not see blood in the catheter when you pull back on the syringe, change your body position, such as by raising your arms above your head. Take a deep breath and cough. Then, pull back on the syringe. If you still do not see blood, flush the catheter with a small amount of solution. Then, change positions again and take a breath or cough. Pull back on the syringe again. If you still do not see   blood, finish flushing the catheter and contact your health care provider. Do not use your catheter until your health care provider says it is okay. General tips  Have someone help you flush your catheter, if possible.  Do not force fluid through your catheter.  Do not use a syringe that is larger or smaller than 10 mL. Using a smaller syringe can make the catheter burst.  Do not use your catheter without flushing it first if it has heparin in it. Contact a health  care provider if:  You cannot see any blood in the catheter when you flush it before using it.  Your catheter is difficult to flush. Get help right away if:  You cannot flush the catheter.  The catheter leaks when you flush it or when there is fluid in it.  There are cracks or breaks in the catheter. Summary  It is important to flush your tunneled central venous catheter each time you use it, both before and after you use it.  Scrub the injection cap for 15 seconds with a disinfecting wipe before and after you flush it.  When you flush your catheter, make sure you follow any specific instructions from your health care provider or the manufacturer.  Get help right away if you cannot flush the catheter. This information is not intended to replace advice given to you by your health care provider. Make sure you discuss any questions you have with your health care provider. Document Released: 10/28/2011 Document Revised: 01/24/2019 Document Reviewed: 01/24/2019 Elsevier Patient Education  2020 Elsevier Inc.  

## 2019-06-07 NOTE — Progress Notes (Signed)
Pt blood sugar 287 at discharge. Sandy RN aware and inform Dr. Irene Limbo. Pt port saline locked and pt to MRI ambulatory.

## 2019-06-07 NOTE — Telephone Encounter (Signed)
[place] [phone] contacted. Raquel Sarna w/[facility] confirmed receipt of MD orders, patient records and referral faxed 7/15. States patient is admitted and assigned to a nurse team -- she should be contacted today or tomorrow to set up evaluation. Notified patient of this (patient here at [facility] for infusion). Patient verbalized understanding.

## 2019-06-07 NOTE — Progress Notes (Signed)
HEMATOLOGY/ONCOLOGY CLINIC NOTE  Date of Service: 06/07/19    Patient Care Team: Patient, No Pcp Per as PCP - General (General Practice)  CHIEF COMPLAINTS/PURPOSE OF CONSULTATION:   F/u for metastatic ER/PR +ve, Her2 neg breast cancer  HISTORY OF PRESENTING ILLNESS:  plz see previous notes for details.   INTERVAL HISTORY   Stacey Cox is here for follow-up of her metastatic hormone positive HER-2 negative breast cancer. The patient's last visit with [place] was on 05/31/2019. The pt reports that she is doing well overall.  The pt reports a palpable lump on her upper right back.   She continues with her 3rd cycle of paclitaxel with OnPro support. Today is currently day 8 of cycle 3. The pt has no prohibitive toxicities from continuing this treatment at this time.  She is scheduled for a brain MRI today.   Lab results today (06/07/19) of CBC w/diff and CMP is as follows: all values are WNL except for RDW at 17.3, nRBC at 0.8, abs immature at 0.12, glucose at 355, total protein at 6.4, albumin at 3.2, AST at 50, ALT at 76, and alkaline phosphatase at 258.  On review of systems, pt denies any symptoms.     MEDICAL HISTORY:  Past Medical History:  Diagnosis Date  . Cancer [facility])    Metastatic Breast Cancer    SURGICAL HISTORY: Past Surgical History:  Procedure Laterality Date  . CHOLECYSTECTOMY    . IR IMAGING GUIDED PORT INSERTION  07/14/2018  . TONSILLECTOMY      SOCIAL HISTORY: Social History   Socioeconomic History  . Marital status: Single    Spouse name: Not on file  . Number of children: Not on file  . Years of education: Not on file  . Highest education level: Not on file  Occupational History  . Not on file  Social Needs  . Financial resource strain: Not on file  . Food insecurity    Worry: Not on file    Inability: Not on file  . Transportation needs    Medical: Not on file    Non-medical: Not on file  Tobacco Use  . Smoking status: Current  Every Day Smoker    Packs/day: 0.15    Types: Cigarettes    Last attempt to quit: 01/21/2017    Years since quitting: 2.3  . Smokeless tobacco: Never Used  Substance and Sexual Activity  . Alcohol use: No    Alcohol/week: 0.0 standard drinks  . Drug use: No  . Sexual activity: Never  Lifestyle  . Physical activity    Days per week: Not on file    Minutes per session: Not on file  . Stress: Not on file  Relationships  . Social Herbalist on phone: Not on file    Gets together: Not on file    Attends religious service: Not on file    Active member of club or organization: Not on file    Attends meetings of clubs or organizations: Not on file    Relationship status: Not on file  . Intimate partner violence    Fear of current or ex partner: No    Emotionally abused: No    Physically abused: No    Forced sexual activity: No  Other Topics Concern  . Not on file  Social History Narrative   Lives alone. 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 Has phobia of the medical system.  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 Cancer Mother        breast  . Cancer Sister        breast  Mother with h/o breast cancer in her 21's Sister with breast cancer in her 7ys and later was diagnosed with multiple myeloma. (Patient is not aware of any specific breast cancer mutations present)  ALLERGIES:  is allergic to thorazine [chlorpromazine] and ribociclib.  MEDICATIONS:  Current Outpatient Medications  Medication Sig Dispense Refill  . aspirin EC 81 MG tablet Take 1 tablet (81 mg total) by mouth daily.    . chlorhexidine (PERIDEX) 0.12 % solution Use as directed 15 mLs in the mouth or throat 2 (two) times daily as needed (mouth rinse after vomiting). Swish and spit 473 mL 1  . dexamethasone (DECADRON) 1 MG tablet TAKE 1 TABLET(1 MG) BY MOUTH DAILY 30  tablet 2  . escitalopram (LEXAPRO) 20 MG tablet TAKE 1 TABLET(20 MG) BY MOUTH DAILY 30 tablet 0  . feeding supplement, ENSURE ENLIVE, (ENSURE ENLIVE) LIQD Take 237 mLs by mouth 2 (two) times daily between meals. (Patient taking differently: Take 237 mLs by mouth daily. ) 237 mL 12  . hydrOXYzine (ATARAX/VISTARIL) 10 MG tablet Take 10 mg by mouth 2 (two) times daily.    [name] levETIRAcetam (KEPPRA) 1000 MG tablet Take 1 tablet (1,000 mg total) by mouth 2 (two) times daily. 60 tablet 5  . pantoprazole (PROTONIX) 40 MG tablet TAKE 1 TABLET(40 MG) BY MOUTH DAILY 30 tablet 1  . triamcinolone (KENALOG) 0.025 % ointment Apply 1 application topically 2 (two) times daily. Please in thin layer gently (without rubbing) over eczematoid rash over the left arm 30 g 0  . Vitamin D, Ergocalciferol, (DRISDOL) 1.25 MG (50000 UT) CAPS capsule TAKE 1 CAPSULE BY MOUTH 3 TIMES WEEKLY 24 capsule 0   No current facility-administered medications for this visit.    Facility-Administered Medications Ordered in Other Visits  Medication Dose Route Frequency Provider Last Rate Last Dose  . denosumab (XGEVA) injection 120 mg  120 mg Subcutaneous Once [name], MD      . fulvestrant (FASLODEX) injection 500 mg  500 mg Intramuscular Once [name], MD        REVIEW OF SYSTEMS:   A 10+ POINT REVIEW OF SYSTEMS WAS OBTAINED including neurology, dermatology, psychiatry, cardiac, respiratory, lymph, extremities, GI, GU, Musculoskeletal, constitutional, breasts, reproductive, HEENT.  All pertinent positives are noted in the HPI.  All others are negative.     PHYSICAL EXAMINATION:  ECOG PERFORMANCE STATUS: 1 - Symptomatic but completely ambulatory  Vitals:   06/07/19 0842  BP: 106/77  Pulse: 100  Resp: 18  Temp: 99 F (37.2 C)  SpO2: 98%   Filed Weights   06/07/19 0842  Weight: 171 lb 8 oz (77.8 kg)   Body mass index is 28.54 kg/m.  GENERAL:alert, in no acute distress and comfortable SKIN: no  acute rashes, no significant lesions, palpable lipoma on the upper left back over the scapula  EYES: conjunctiva are pink and non-injected, sclera anicteric OROPHARYNX: MMM, no exudates, no oropharyngeal erythema or ulceration NECK: supple, no JVD LYMPH:  no palpable lymphadenopathy in the cervical, axillary or inguinal regions LUNGS: clear to auscultation b/l with normal respiratory effort HEART: regular rate & rhythm ABDOMEN:  normoactive bowel sounds , non tender, not distended. Extremity: no pedal edema PSYCH: alert &  oriented x 3 with fluent speech NEURO: no focal motor/sensory deficits    LABORATORY DATA:  I have reviewed the data as listed  CBC Latest Ref Rng & Units 06/07/2019 05/31/2019 05/17/2019  WBC 4.0 - 10.5 K/uL 6.3 11.2(H) 4.1  Hemoglobin 12.0 - 15.0 g/dL 12.0 13.1 12.7  Hematocrit 36.0 - 46.0 % 36.5 40.3 38.2  Platelets 150 - 400 K/uL 200 134(L) 147(L)   CBC    Component Value Date/Time   WBC 6.3 06/07/2019 0810   RBC 3.93 06/07/2019 0810   HGB 12.0 06/07/2019 0810   HGB 12.7 04/12/2019 0901   HGB 13.9 11/24/2017 1340   HCT 36.5 06/07/2019 0810   HCT 42.0 11/24/2017 1340   PLT 200 06/07/2019 0810   PLT 198 04/12/2019 0901   PLT 212 11/24/2017 1340   MCV 92.9 06/07/2019 0810   MCV 97.0 11/24/2017 1340   MCH 30.5 06/07/2019 0810   MCHC 32.9 06/07/2019 0810   RDW 17.3 (H) 06/07/2019 0810   RDW 14.1 11/24/2017 1340   LYMPHSABS 1.2 06/07/2019 0810   LYMPHSABS 1.4 11/24/2017 1340   MONOABS 0.5 06/07/2019 0810   MONOABS 0.3 11/24/2017 1340   EOSABS 0.0 06/07/2019 0810   EOSABS 0.1 11/24/2017 1340   BASOSABS 0.1 06/07/2019 0810   BASOSABS 0.1 11/24/2017 1340    . CMP Latest Ref Rng & Units 06/07/2019 05/31/2019 05/17/2019  Glucose 70 - 99 mg/dL 355(H) 373(H) 309(H)  BUN 8 - 23 mg/dL _0 Creatinine 0.44 - 1.00 mg/dL 0.80 0.87 0.83  Sodium 135 - 145 mmol/L 136 137 136  Potassium 3.5 - 5.1 mmol/L 3.5 3.8 3.9  Chloride 98 - 111 mmol/L 98 100 99  CO2 22 -  32 mmol/L _1 Calcium 8.9 - 10.3 mg/dL 9.4 8.9 8.9  Total Protein 6.5 - 8.1 g/dL 6.4(L) 6.4(L) 6.7  Total Bilirubin 0.3 - 1.2 mg/dL 0.5 0.7 0.7  Alkaline Phos 38 - 126 U/L 258(H) 341(H) 303(H)  AST 15 - 41 U/L 50(H) 36 60(H)  ALT 0 - 44 U/L 76(H) 70(H) 105(H)   07/18/18 Pathology:          RADIOGRAPHIC STUDIES: I have personally reviewed the radiological images as listed and agreed with the findings in the report. No results found.  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 Multifocal tumor in the left breast with biopsy-proven left axillary metastases.   Noted to have extensive bone metastases and pulmonary metastases. Patient was noted to have calvarial metastases but no overt parenchymal metastasis 09/14/17 CT Chest/ad/pelvis Results discussed in details - good response.  02/22/18 CT revealed  Newly apparent 2.1 by 2.0 cm rim enhancing lesion in the right hepatic lobe suspicious for a metastatic lesion. This was not apparent on prior exams although the prior exams were all noncontrast and thus the lesion may have been present but with reduced conspicuity. There is also a small enhancing lesion further posteriorly in the right hepatic lobe which is technically nonspecific and could be a small hemangioma or a small metastatic lesion. Stable distribution and appearance of prior sclerotic osseous metastatic disease without bony progression. 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   07/06/18 CT C/A/P which revealed New pattern of heterogeneous enhancement and enhancing ill-defined lesions in the central LEFT and RIGHT hepatic  lobe at site prior enhancing lesion is highly concerning for progression of of infiltrative malignancy in the liver occupying a large portion of the RIGHT hepatic lobe and  central LEFT hepatic lobe. 2. Small amount of free fluid in the abdomen pelvis. 3. No metastatic adenopathy.4. No evidence pulmonary metastasis or lymphadenopathy. 5. Stable dense sclerotic skeletal metastasis   07/11/18 ECHO revealed LV EF of 55%-60%   07/18/18 Liver needle/core biopsy right lesion consi with Ki67 at 40%, ER/PR +ve   07/28/18 MRI Abdomen MRCP revealed Extensive hepatic metastatic disease, possibly progressive from recent abdominal CT. 2. No evidence of intrahepatic or extrahepatic biliary dilatation. Elevated liver function studies may be secondary to intrahepatic cholestasis. 3. Increased ascites without peritoneal nodularity or suspicious enhancement. 4. Grossly stable widespread osseous metastatic disease.   S/p 6 cycles of AC completed on 11/01/18  03/20/19 CT C/A/P revealed "Although the dominant liver lesion appears improved in the interval, there appears to be 2 new liver metastases on today's exam, concerning for disease progression. 2. Similar appearance of the widespread sclerotic bone metastases. 3. Interval decrease in ascites. 4. Aortic Atherosclerois."  Pt's planned third weekly infusion of Taxol was cancelled due to dental abscess s/p antibiotic course. Then cancelled again due to thrombocytopenia thought to be due to antibiotic course.  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 Much improved back pain.  # 3 Neutropenia Related to her Ribociclib - resolved. Patient is currently on Verzenio and has not developed any neutropenia. This is being monitored.  #4 Increased LFTs due to new liver metastases  #5 Cancer related pain - controlled - status post palliative RT to spine - no currently needing any pain medications.  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 No other new medication. Now resolved. Monitoring on increasing doses of Verzenio. No issues with recurrent rash on Verzenio 17m po BID  #7 Brain metastasis  07/20/18 CT Head  revealed 1.8 x 2.4 x 3.9 cm LEFT frontal cortical based mass with possible dural component highly concerning for metastatic disease, less likely abscess or cerebritis. Severe vasogenic edema with 5 mm LEFT to RIGHT subfalcine herniation. No ventricular entrapment. 2. 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 3. Multiple calvarial metastasis. 4. Cerebellar atrophy.    07/21/18 MRI Brain revealed  Irregular plaque-like mass, likely metastasis, measuring up to 5.1 cm, appears to originate from the left anterolateral frontal dura with invasion of the underlying frontal lobe of the brain. Mass effect from brain edema and the lesion partially effaces the frontal horn of left lateral ventricle and results in 7 mm left-to-right midline shift of anterior septum pellucidum. 2. Diffuse dural thickening over the left cerebral convexity may represent edema associated with the tumor or invasive neoplasm. 3. 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   07/28/18 MRI Brain revealed Extensive nodular dural enhancement left frontal lobe with associated enhancing mass growing in the left frontal lobe is unchanged in size and compatible with metastatic disease. This is associated with metastatic disease to the left frontal bone. Extensive edema in the left frontal lobe has progressed in the interval. Blastic metastatic disease throughout the calvarium. Metastatic disease in the cervical spine. Enhancing mass along the floor of the posterior fossa on the right is stable and most consistent with meningioma. 6 mm enhancing mass in the prepontine cistern on the right also unchanged and compatible with meningioma.   s/p SRS  radiation therapy  10/23/18 MRI Brain revealed SRS protocol demonstrating treatment response: Regression of LEFT frontal dural metastasis, decreased vasogenic edema and parenchymal invasion. Similar calvarial metastasis. 2. Stable  appearance of 2 small posterior fossa meningiomas. 3. No acute intracranial process.   02/02/19 MRI Brain revealed "Marked progression of dural disease left greater than right as described above. Pronounced brain edema, left more than right with left-to-right shift of 1 cm. 2. I do not identify any brain parenchymal metastases. 3. Small stable incidental meningiomas in the posterior fossa as above."  Pt is s/p SRS for dural metastases.  PLAN:  -Continue follow up with Dr. Mickeal Skinner in Neuro-oncology on 02/06/2019 and Dr. Tammi Klippel in [place] with Delton See every 4 weeks   #8 Vitamin D deficiency Vit D levels improved to 44.8  On 12/01/2018 -continue Vit D replacement.  #9 h/o Left upper extremity swelling- [place] neg for DVT - now swelling resolved.   PLAN:  -Discussed pt labwork today, 06/07/19; all values are WNL except for RDW at 17.3, nRBC at 0.8, abs immature at 0.12, glucose at 355, total protein at 6.4, albumin at 3.2, AST at 50, ALT at 76, and alkaline phosphatase at 258. -The pt has no prohibitive toxicities from continuing paclitaxel with OnPro support at this time. -Discussed dexamethazone  -Discussed prior rash on left upper extremity -Discussed giving insulin for today's treatment  -scheduled for MRI brain today -Return in one week for treatment  FOLLOW UP: F/u for labs and chemo on 7/23 May cancel MD visit on 7/23 Please schedule next cycle of taxol as per orders with labs and MD visit on D1 of cycle.   All of the patients questions were answered with apparent satisfaction. The patient knows to call the clinic with any problems, questions or concerns.  The total time spent in the appt was 20 minutes and more than 50% was on counseling and direct patient cares.     Sullivan Lone MD MS AAHIVMS [facility] Hematology/Oncology Physician [facility]  (Office):       [phone] (Work cell):  [phone] (Fax):           [phone]  I, Jacqualyn Posey, am acting  as a scribe for Dr. Sullivan Lone.   .I have reviewed the above documentation for accuracy and completeness, and I agree with the above. [name] MD

## 2019-06-07 NOTE — Progress Notes (Signed)
Per Dr. Irene Limbo, ok to restart Xgeva next infusion appointment.

## 2019-06-07 NOTE — Patient Instructions (Signed)
[facility] Discharge Instructions for Patients Receiving Chemotherapy  Today you received the following chemotherapy agents: Taxol  To help prevent nausea and vomiting after your treatment, we encourage you to take your nausea medication as directed.    If you develop nausea and vomiting that is not controlled by your nausea medication, call the clinic.   BELOW ARE SYMPTOMS THAT SHOULD BE REPORTED IMMEDIATELY:  *FEVER GREATER THAN 100.5 F  *CHILLS WITH OR WITHOUT FEVER  NAUSEA AND VOMITING THAT IS NOT CONTROLLED WITH YOUR NAUSEA MEDICATION  *UNUSUAL SHORTNESS OF BREATH  *UNUSUAL BRUISING OR BLEEDING  TENDERNESS IN MOUTH AND THROAT WITH OR WITHOUT PRESENCE OF ULCERS  *URINARY PROBLEMS  *BOWEL PROBLEMS  UNUSUAL RASH Items with * indicate a potential emergency and should be followed up as soon as possible.  Feel free to call the clinic should you have any questions or concerns. The clinic phone number is [phone].  Please show the [facility] at check-in to the Emergency Department and triage nurse.  Coronavirus (COVID-19) Are you at risk?  Are you at risk for the Coronavirus (COVID-19)?  To be considered HIGH RISK for Coronavirus (COVID-19), you have to meet the following criteria:  . Traveled to [place], [place], [place], [place] or [place]; or in the [place] to [place], [place], [place], or [place]; and have fever, cough, and shortness of breath within the last 2 weeks of travel OR . Been in close contact with a person diagnosed with COVID-19 within the last 2 weeks and have fever, cough, and shortness of breath . IF YOU DO NOT MEET THESE CRITERIA, YOU ARE CONSIDERED LOW RISK FOR COVID-19.  What to do if you are HIGH RISK for COVID-19?  [name] If you are having a medical emergency, call 911. . Seek medical care right away. Before you go to a doctor's office, urgent care or emergency department, call ahead and tell them about your recent  travel, contact with someone diagnosed with COVID-19, and your symptoms. You should receive instructions from your physician's office regarding next steps of care.  . When you arrive at healthcare provider, tell the healthcare staff immediately you have returned from visiting [place], [place], [place], [place] or [place]; or traveled in the [place] to [place], [place], [place], or [place]; in the last two weeks or you have been in close contact with a person diagnosed with COVID-19 in the last 2 weeks.   . Tell the health care staff about your symptoms: fever, cough and shortness of breath. . After you have been seen by a medical provider, you will be either: o Tested for (COVID-19) and discharged home on quarantine except to seek medical care if symptoms worsen, and asked to  - Stay home and avoid contact with others until you get your results (4-5 days)  - Avoid travel on public transportation if possible (such as bus, train, or airplane) or o Sent to the Emergency Department by EMS for evaluation, COVID-19 testing, and possible admission depending on your condition and test results.  What to do if you are LOW RISK for COVID-19?  Reduce your risk of any infection by using the same precautions used for avoiding the common cold or flu:  [name] Wash your hands often with soap and warm water for at least 20 seconds.  If soap and water are not readily available, use an alcohol-based hand sanitizer with at least 60% alcohol.  . If coughing or  sneezing, cover your mouth and nose by coughing or sneezing into the elbow areas of your shirt or coat, into a tissue or into your sleeve (not your hands). . Avoid shaking hands with others and consider head nods or verbal greetings only. . Avoid touching your eyes, nose, or mouth with unwashed hands.  . Avoid close contact with people who are sick. . Avoid places or events with large numbers of people in one location, like concerts or sporting  events. . Carefully consider travel plans you have or are making. . If you are planning any travel outside or inside the [place], visit the CDC's Travelers' Health webpage for the latest health notices. . If you have some symptoms but not all symptoms, continue to monitor at home and seek medical attention if your symptoms worsen. . If you are having a medical emergency, call 911.   [place] / e-Visit: [email or web address]         [facility]: [facility]: [phone]                   [facility]: [phone]

## 2019-06-07 NOTE — Progress Notes (Signed)
1040: Repeat BG was 243. Dr. Irene Limbo and his Nurse Lovey Newcomer) notified. Will give insulin as ordered (see MAR) and recheck prior to patient leaving unit.

## 2019-06-07 NOTE — Progress Notes (Signed)
Verbal order per Dr. Irene Limbo: 4 units Regular Insulin sq as coverage for CBG 243. Recheck CBG before patient leaves infusion today.

## 2019-06-08 ENCOUNTER — Telehealth: Payer: Self-pay | Admitting: Hematology

## 2019-06-08 NOTE — Telephone Encounter (Signed)
Scheduled appt per 7/16 los. ° °Spoke with patient and she is aware of her appt date and time. °

## 2019-06-11 ENCOUNTER — Telehealth: Payer: Self-pay | Admitting: *Deleted

## 2019-06-11 NOTE — Telephone Encounter (Signed)
Patient called - asked if Dr. Irene Limbo had forgotten to order medicine for the tingling in her hands and feet. Informed her that Dr. Irene Limbo will be asked the question.  Contacted by Mordecai Rasmussen, RN w/[facility] [phone]. After initial evaluation of patient, they would like orders for patient to have Nurse Visit 2 x week/1 week and then 1 x week/2 weeks. They would also like the have Social Worker visit to assist with community resources. Dr. Irene Limbo gave verbal auth to above orders. Contacted Cecile Hearing, RN w/[facility] [phone], left voice mail authorizing orders as noted above on secure voice mail.

## 2019-06-12 ENCOUNTER — Other Ambulatory Visit: Payer: Self-pay | Admitting: Hematology

## 2019-06-12 ENCOUNTER — Other Ambulatory Visit: Payer: Self-pay

## 2019-06-12 ENCOUNTER — Inpatient Hospital Stay: Payer: BC Managed Care – PPO

## 2019-06-12 VITALS — BP 108/72 | HR 89 | Temp 99.0°F | Resp 18

## 2019-06-12 DIAGNOSIS — Z7189 Other specified counseling: Secondary | ICD-10-CM

## 2019-06-12 DIAGNOSIS — C7951 Secondary malignant neoplasm of bone: Secondary | ICD-10-CM

## 2019-06-12 DIAGNOSIS — Z95828 Presence of other vascular implants and grafts: Secondary | ICD-10-CM

## 2019-06-12 MED ORDER — FULVESTRANT 250 MG/5ML IM SOLN: 500.0000 mg | Freq: Once | INTRAMUSCULAR | Status: DC

## 2019-06-12 MED ORDER — FULVESTRANT 250 MG/5ML IM SOLN
INTRAMUSCULAR | Status: AC
  Filled 2019-06-12: qty 10

## 2019-06-12 MED ORDER — DENOSUMAB 120 MG/1.7ML ~~LOC~~ SOLN
SUBCUTANEOUS | Status: AC
  Filled 2019-06-12: qty 1.7

## 2019-06-12 NOTE — Progress Notes (Signed)
Pt returned today for injection. Xgeva orders are not in the system at this time pt is returning 7/23 for doctors apt and treatment. Injection will be given at that time

## 2019-06-12 NOTE — Patient Instructions (Signed)
Fulvestrant injection What is this medicine? FULVESTRANT (ful VES trant) blocks the effects of estrogen. It is used to treat breast cancer. This medicine may be used for other purposes; ask your health care provider or pharmacist if you have questions. COMMON BRAND NAME(S): FASLODEX What should I tell my health care provider before I take this medicine? They need to know if you have any of these conditions:  bleeding disorders  liver disease  low blood counts, like low white cell, platelet, or red cell counts  an unusual or allergic reaction to fulvestrant, other medicines, foods, dyes, or preservatives  pregnant or trying to get pregnant  breast-feeding How should I use this medicine? This medicine is for injection into a muscle. It is usually given by a health care professional in a hospital or clinic setting. Talk to your pediatrician regarding the use of this medicine in children. Special care may be needed. Overdosage: If you think you have taken too much of this medicine contact a poison control center or emergency room at once. NOTE: This medicine is only for you. Do not share this medicine with others. What if I miss a dose? It is important not to miss your dose. Call your doctor or health care professional if you are unable to keep an appointment. What may interact with this medicine?  medicines that treat or prevent blood clots like warfarin, enoxaparin, dalteparin, apixaban, dabigatran, and rivaroxaban This list may not describe all possible interactions. Give your health care provider a list of all the medicines, herbs, non-prescription drugs, or dietary supplements you use. Also tell them if you smoke, drink alcohol, or use illegal drugs. Some items may interact with your medicine. What should I watch for while using this medicine? Your condition will be monitored carefully while you are receiving this medicine. You will need important blood work done while you are taking  this medicine. Do not become pregnant while taking this medicine or for at least 1 year after stopping it. Women of child-bearing potential will need to have a negative pregnancy test before starting this medicine. Women should inform their doctor if they wish to become pregnant or think they might be pregnant. There is a potential for serious side effects to an unborn child. Men should inform their doctors if they wish to father a child. This medicine may lower sperm counts. Talk to your health care professional or pharmacist for more information. Do not breast-feed an infant while taking this medicine or for 1 year after the last dose. What side effects may I notice from receiving this medicine? Side effects that you should report to your doctor or health care professional as soon as possible:  allergic reactions like skin rash, itching or hives, swelling of the face, lips, or tongue  feeling faint or lightheaded, falls  pain, tingling, numbness, or weakness in the legs  signs and symptoms of infection like fever or chills; cough; flu-like symptoms; sore throat  vaginal bleeding Side effects that usually do not require medical attention (report to your doctor or health care professional if they continue or are bothersome):  aches, pains  constipation  diarrhea  headache  hot flashes  nausea, vomiting  pain at site where injected  stomach pain This list may not describe all possible side effects. Call your doctor for medical advice about side effects. You may report side effects to FDA at 1-800-FDA-1088. Where should I keep my medicine? This drug is given in a hospital or clinic and will  not be stored at home. NOTE: This sheet is a summary. It may not cover all possible information. If you have questions about this medicine, talk to your doctor, pharmacist, or health care provider.  2020 Elsevier/Gold Standard (2018-02-16 11:34:41) Denosumab injection What is this  medicine? DENOSUMAB (den oh sue mab) slows bone breakdown. Prolia is used to treat osteoporosis in women after menopause and in men, and in people who are taking corticosteroids for 6 months or more. Delton See is used to treat a high calcium level due to cancer and to prevent bone fractures and other bone problems caused by multiple myeloma or cancer bone metastases. Delton See is also used to treat giant cell tumor of the bone. This medicine may be used for other purposes; ask your health care provider or pharmacist if you have questions. COMMON BRAND NAME(S): Prolia, XGEVA What should I tell my health care provider before I take this medicine? They need to know if you have any of these conditions:  dental disease  having surgery or tooth extraction  infection  kidney disease  low levels of calcium or Vitamin D in the blood  malnutrition  on hemodialysis  skin conditions or sensitivity  thyroid or parathyroid disease  an unusual reaction to denosumab, other medicines, foods, dyes, or preservatives  pregnant or trying to get pregnant  breast-feeding How should I use this medicine? This medicine is for injection under the skin. It is given by a health care professional in a hospital or clinic setting. A special MedGuide will be given to you before each treatment. Be sure to read this information carefully each time. For Prolia, talk to your pediatrician regarding the use of this medicine in children. Special care may be needed. For Delton See, talk to your pediatrician regarding the use of this medicine in children. While this drug may be prescribed for children as young as 13 years for selected conditions, precautions do apply. Overdosage: If you think you have taken too much of this medicine contact a poison control center or emergency room at once. NOTE: This medicine is only for you. Do not share this medicine with others. What if I miss a dose? It is important not to miss your dose. Call  your doctor or health care professional if you are unable to keep an appointment. What may interact with this medicine? Do not take this medicine with any of the following medications:  other medicines containing denosumab This medicine may also interact with the following medications:  medicines that lower your chance of fighting infection  steroid medicines like prednisone or cortisone This list may not describe all possible interactions. Give your health care provider a list of all the medicines, herbs, non-prescription drugs, or dietary supplements you use. Also tell them if you smoke, drink alcohol, or use illegal drugs. Some items may interact with your medicine. What should I watch for while using this medicine? Visit your doctor or health care professional for regular checks on your progress. Your doctor or health care professional may order blood tests and other tests to see how you are doing. Call your doctor or health care professional for advice if you get a fever, chills or sore throat, or other symptoms of a cold or flu. Do not treat yourself. This drug may decrease your body's ability to fight infection. Try to avoid being around people who are sick. You should make sure you get enough calcium and vitamin D while you are taking this medicine, unless your doctor tells  you not to. Discuss the foods you eat and the vitamins you take with your health care professional. See your dentist regularly. Brush and floss your teeth as directed. Before you have any dental work done, tell your dentist you are receiving this medicine. Do not become pregnant while taking this medicine or for 5 months after stopping it. Talk with your doctor or health care professional about your birth control options while taking this medicine. Women should inform their doctor if they wish to become pregnant or think they might be pregnant. There is a potential for serious side effects to an unborn child. Talk to your  health care professional or pharmacist for more information. What side effects may I notice from receiving this medicine? Side effects that you should report to your doctor or health care professional as soon as possible:  allergic reactions like skin rash, itching or hives, swelling of the face, lips, or tongue  bone pain  breathing problems  dizziness  jaw pain, especially after dental work  redness, blistering, peeling of the skin  signs and symptoms of infection like fever or chills; cough; sore throat; pain or trouble passing urine  signs of low calcium like fast heartbeat, muscle cramps or muscle pain; pain, tingling, numbness in the hands or feet; seizures  unusual bleeding or bruising  unusually weak or tired Side effects that usually do not require medical attention (report to your doctor or health care professional if they continue or are bothersome):  constipation  diarrhea  headache  joint pain  loss of appetite  muscle pain  runny nose  tiredness  upset stomach This list may not describe all possible side effects. Call your doctor for medical advice about side effects. You may report side effects to FDA at 1-800-FDA-1088. Where should I keep my medicine? This medicine is only given in a clinic, doctor's office, or other health care setting and will not be stored at home. NOTE: This sheet is a summary. It may not cover all possible information. If you have questions about this medicine, talk to your doctor, pharmacist, or health care provider.  2020 Elsevier/Gold Standard (2018-03-17 16:10:44)  

## 2019-06-13 ENCOUNTER — Encounter: Payer: Self-pay | Admitting: Radiation Oncology

## 2019-06-13 ENCOUNTER — Telehealth: Payer: Self-pay | Admitting: *Deleted

## 2019-06-13 ENCOUNTER — Ambulatory Visit
Admission: RE | Admit: 2019-06-13 | Discharge: 2019-06-13 | Disposition: A | Discharge status: Home / Self Care | Payer: BC Managed Care – PPO | Source: Ambulatory Visit | Attending: Urology | Admitting: Urology

## 2019-06-13 DIAGNOSIS — C50919 Malignant neoplasm of unspecified site of unspecified female breast: Secondary | ICD-10-CM

## 2019-06-13 DIAGNOSIS — C7931 Secondary malignant neoplasm of brain: Secondary | ICD-10-CM

## 2019-06-13 DIAGNOSIS — C7951 Secondary malignant neoplasm of bone: Secondary | ICD-10-CM

## 2019-06-13 NOTE — Telephone Encounter (Signed)
Caprice Red, OT w/[facility] called. Call back number is [phone]. Requesting verbal order for - Training & therapeutic excercises and energy conservation - 1 visit a week for 2 weeks.

## 2019-06-13 NOTE — Progress Notes (Signed)
Radiation Oncology         [phone] ________________________________  Name: Stacey Cox MRN: [id number]  Date: 06/13/2019  DOB: 06/24/1956  Post Treatment Note  CC: Patient, No Pcp Per  Stacey Cox  Diagnosis:   63 y.o. female with progressive dural metastasis secondary to metastatic breast cancer  Interval Since Last Radiation:  3 months  02/20/19, 02/23/19, 02/27/19, 03/02/19, 03/06/19:  PTV2- Dura treated to 25 Gy in 5 fractions  08/16/2018, 08/18/2018, 08/21/2018, 08/23/2018, 08/25/2018: Brain PTV1: Left anterior frontal lobe 73m // 25 Gy in 5 fractions  02/07/2017-02/18/2017:  The L-Spine was treated to 30 Gy in 10 fractions at 3 Gy per fraction.  Narrative:  I spoke with the patient to conduct her routine scheduled 3 month follow up visit to review her recent follow up brain MRI s/p completion of fractionated SRS treatement to the progressive dural metastases via telephone to spare the patient unnecessary potential exposure in the healthcare setting during the current COVID-19 pandemic.  The patient was notified in advance and gave permission to proceed with this visit format.  She denies any ill side effects associated with her recent radiotherapy.                              In summary, she is a 63y.o. 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 CT and MRI imaging at the time of admission/workup showed an ill-defined sclerotic lesion at L2 without pathologic fracture, mildly sclerotic as well as a mildly expansile lesion at LEFT L3 vertebral body with suspected pathologic fracture. She had an MRI of brain on 01/22/17 which revealed an 11 mm extra-axial mass in the posterior fossa favoring a small meningioma over metastasis and a 6 mm nodule was also discovered in the prepontine cistern, possibly  an additional small meningioma. A large occipital lobe calvarial lesion was noted but no parenchymal disease.    She established care with Dr. KIrene Cox Cox and received Xgeva injections every 4 weeks in addition to [place] She received palliative radiation to her lumbar spine and sacrum from 02/07/2017 to 02/18/2017 and had dramatic improvement of her low back pain. She initially showed a good response to treatment until April 2019 CT showed a new enhancing lesion in the right hepatic lobe. CT C/A/P on 07/06/2018 showed no evidence of pulmonary metastasis or lymphadenopathy and stable dense sclerotic skeletal metastasis. There was a new pattern of heterogeneous enhancement and enhancing ill-defined lesions in the central left and right hepatic lobes. Prior enhancing lesion was highly concerning for progression of infiltrative malignancy in the liver occupying a large portion of the right hepatic lobe and central left hepatic lobe. No metastatic adenopathy in the abdomen/pelvis. Stable skeletal sclerotic metastasis. Her systemic therapy was changed to doxorubicin and cyclophosphamide every 3 weeks, beginning on 07/13/2018.  Verzenio and Femara were discontinued. A bipsy of the right lesion of the liver on 07/18/2018 revealed metastatic carcinoma, consistent with primary ER/PR positive breast carcinoma.  She presented to the emergency department on 07/20/2018 with two witnessed seizure episodes. She denied any neurologic symptoms prior to the seizures, and after a day in the hospital on Keppra was back to her baseline. Brain MRI at the time of admission demonstrated a plaque-like irregular enhancing mass with broad base  to the dura measuring 5.1 x 2.2 x 2.4 cm, likely originating from the dura with invasion of the underlying left frontal lobe with diffuse, smooth dural thickening over the left cerebral convexity which may represent associated edema and/or invasive neoplasm. Mass effect from the lesion  and brain edema partially effaces the frontal horn of left lateral ventricle resulting in 7 mm of left-to-right midline shift of the anterior septum pellucidum but without herniation. She was started on Decadron 4 mg daily. Repeat MRI of the brain on 07/28/2018 showed extensive nodular dural enhancement in the left frontal lobe with associated enhancing mass growing in the left frontal lobe, unchanged in size and compatible with metastatic disease associated with metastatic disease to the left frontal bone. Extensive edema in the left frontal lobe had progressed. There was blastic metastatic disease throughout the calvarium, as well as metastatic disease in the cervical spine. Enhancing mass along the floor of the posterior fossa on the right appeared stable and most consistent with meningioma. 6 mm enhancing mass in the prepontine cistern on the right was also unchanged and compatible with meningioma.  She elected to proceed with fractionated SRS treatment to the left frontal mass which was completed on 08/25/18 and tolerated very well.  She continued on systemic therapy with Doxorubicin and Cyclophosphamide under the care and direction of Stacey Cox- recently completed 6 cycles on 11/01/18 in addition to receiving Faslodex and Xgeva monthly.   She presented back to the ED on 01/27/19 with breakthrough seizure activity despite continuing on Keppra 522m po BID.  Her dose of Keppra was increased to 10019mpo BID and she was started on Decadron with improvement in her symptoms.  She was discharged home on increased dose of Keppra in addition to Decadron 24m52maily and did not have further seizure activity. CT head on admission was concerning for worsening intracranial metastasis with associated edema but a follow up MRI brain scan on 02/02/19 confirmed progressive dural metastases with pronounced edema but without evidence for any parenchymal brain metastases and stability of small meningiomas in the posterior fossa. This  scan was reviewed at multidisciplinary brain tumor board and recommendation was to proceed with a 5 fraction course of SRS to the progressive dural disease and she was in agreement.  This was completed on 03/06/19 and she tolerated treatment very well. She is currently taking 1 mg Dexamethasone daily at the recommendation of Dr. VasMickeal Skinnerhe has had some recent difficulty controlling her blood sugars and is now using insulin prn in addition to her other medications.  She has continued taking Keppra 1000 mg po BID and has recently had breakthrough seizure activity in 04/2019 thought possibly secondary to delayed timing of taking her medication daily.  She was advised to remain on Keppra 1000m69mice daily as well as the 1mg 9mDexamethasone daily.  She has continued Taxol systemic therapy under the care and direction of Dr. Kale Stacey Limboto evidence of disease progression in the liver on recent systemic imaging from 03/20/19.  She is tolerating the Taxol well and continues in close follow up with Dr. Kale.Stacey Limboe has a follow up appointment with Dr. VasloMickeal Skinner/23/20.  We reviewed her most recent MRI brain scan from 06/07/19 which was also reviewed at the recent brain tumor board on 06/11/19.  This scan shows progression of dural based disease as evidenced by a ew enhancing dural lesion measuring 22 x 26 mm,  along the inferolateral left temporal lobe with mild adjacent brain  edema.  There is evidence of a positive response at the intervally treated diffuse dural metastatic disease elsewhere along the bilateral cerebral convexity and stability of the posterior fossa meningiomas with no evidence of primary parenchymal metastasis.  On review of systems, the patient states that she is doing very well overall.  She denies headaches, dizziness, imbalance, difficulty with speech or further seizure activity.  However, her sister, Stacey Cox, who is present with her and participating in the discussion today, adds that she has noticed  multiple occasions where Taeya will give a blank stare and give a one word answer when asked a question.  Otherwise, no further garbled speech and no tremor.  She has continued with moderte residual fatigue and has her days and nights reversed as far as her sleeping schedule goes.  She confirms that she is taking the low dose decadron 45m po daily in the mornings and has continued taking Keppra 1000 mg p.o. twice daily as prescribed.  ALLERGIES:  is allergic to thorazine [chlorpromazine] and ribociclib.  Meds: Current Outpatient Medications  Medication Sig Dispense Refill   aspirin EC 81 MG tablet Take 1 tablet (81 mg total) by mouth daily.     chlorhexidine (PERIDEX) 0.12 % solution Use as directed 15 mLs in the mouth or throat 2 (two) times daily as needed (mouth rinse after vomiting). Swish and spit 473 mL 1   dexamethasone (DECADRON) 1 MG tablet TAKE 1 TABLET(1 MG) BY MOUTH DAILY 30 tablet 2   escitalopram (LEXAPRO) 20 MG tablet TAKE 1 TABLET(20 MG) BY MOUTH DAILY 30 tablet 0   feeding supplement, ENSURE ENLIVE, (ENSURE ENLIVE) LIQD Take 237 mLs by mouth 2 (two) times daily between meals. (Patient taking differently: Take 237 mLs by mouth daily. ) 237 mL 12   hydrOXYzine (ATARAX/VISTARIL) 10 MG tablet Take 10 mg by mouth 2 (two) times daily.     levETIRAcetam (KEPPRA) 1000 MG tablet Take 1 tablet (1,000 mg total) by mouth 2 (two) times daily. 60 tablet 5   pantoprazole (PROTONIX) 40 MG tablet TAKE 1 TABLET(40 MG) BY MOUTH DAILY 30 tablet 1   triamcinolone (KENALOG) 0.025 % ointment Apply 1 application topically 2 (two) times daily. Please in thin layer gently (without rubbing) over eczematoid rash over the left arm 30 g 0   Vitamin D, Ergocalciferol, (DRISDOL) 1.25 MG (50000 UT) CAPS capsule TAKE 1 CAPSULE BY MOUTH 3 TIMES WEEKLY 24 capsule 0   No current facility-administered medications for this visit.    Facility-Administered Medications Ordered in Other Visits  Medication  Dose Route Frequency Provider Last Rate Last Dose   denosumab (XGEVA) injection 120 mg  120 mg Subcutaneous Once KBrunetta Genera Cox       fulvestrant (FASLODEX) injection 500 mg  500 mg Intramuscular Once KBrunetta Genera Cox        Physical Findings:  vitals were not taken for this visit.  Unable to assess due to telephone visit format.  Lab Findings: Lab Results  Component Value Date   WBC 6.3 06/07/2019   HGB 12.0 06/07/2019   HCT 36.5 06/07/2019   MCV 92.9 06/07/2019   PLT 200 06/07/2019     Radiographic Findings: Mr BJeri CosWDJContrast  Result Date: 06/08/2019 CLINICAL DATA:  Breast cancer with dural metastases. Most recent stereotactic radiotherapy 02/20/2019 EXAM: MRI HEAD WITHOUT AND WITH CONTRAST TECHNIQUE: Multiplanar, multiecho pulse sequences of the brain and surrounding structures were obtained without and with intravenous contrast. CONTRAST:  185mMULTIHANCE GADOBENATE DIMEGLUMINE  529 MG/ML IV SOLN COMPARISON:  02/20/2019 FINDINGS: BRAIN New Lesions: 22 x 26 mm enhancing lesion located along the low left dura overlying the transverse sigmoid junction with mild surrounding edema, seen on 13:43. Larger lesions: None. Stable or Smaller lesions: Malignant dural thickening along the left more than right cerebral convexities with intermittent nodularity contacting the brain surface. These areas are diffusely improved with index measurements below: 1. A nodule along the high a left frontal convexity has decreased from 26 x 14 mm to 16 x 9 mm on 13:124 2. A nodule along the high right frontal convexity previously measured 15 x 10 mm and is now only wispy enhancement extending into the adjacent sulcus on 13:124. 3. Dominant nodule along the greater wing left sphenoid, at the upper sphenoid parietal sinus, previously 35 x 17 mm and currently 26 x 7 mm on 13:72. Other Brain findings: Significantly improved brain edema. Marked bilateral inferior cerebellar atrophy. No acute  hemorrhage, hydrocephalus, collection. Stable meningioma in the low right posterior fossa just posterior to the jugular foramen, 14 mm in diameter. This has been stable since 2018. Also stable is a no other presumed meningioma at the right prepontine cistern measuring 7 mm. Vascular: Major flow voids and vascular enhancements are preserved, including the left transverse sigmoid sinus. Skull and upper cervical spine: Known metastasis in the C4 posterior body. Patchy sclerotic metastases in the calvarium that appears similar. Sinuses/Orbits: Negative IMPRESSION: 1. Progression of disease disease as evidenced by new dural metastasis along the inferolateral left temporal lobe with mild adjacent brain edema, see image 13:43. 2. Positive response at the intervally treated diffuse dural metastatic disease elsewhere along the bilateral cerebral convexity. 3. Posterior fossa meningiomas are stable. 4. No primary parenchymal metastasis. Electronically Signed   By: Monte Fantasia M.D.   On: 06/08/2019 04:22    Impression/Plan: 1. 63 y.o. female with progressive dural disease secondary to metastatic breast cancer. Today, I talked to the patient and family about the findings and workup thus far. We reviewed her most recent MRI brain scan which shows a new, enhancing dural based lesion measuring 22 x 26 mm,  along the inferolateral left temporal lobe with mild adjacent brain edema.  We discussed the natural history of metastatic carcinoma and general treatment, highlighting the role of radiotherapy in the management. We discussed the available radiation techniques, and focused on the details of logistics and delivery. The recommendation is to proceed with a single fraction of SRS to the new dural lesion in the left temporal lobe. We reviewed the anticipated acute and late sequelae associated with radiation in this setting. The patient was encouraged to ask questions that were answered to her satisfaction.  At the  conclusion of the conversation, she elects to proceed with single fraction SRS as recommended.  She will have CT SIM/treatment planning following her scheduled infusion tomorrow afternoon in anticipation of proceeding with treatment in the near future. Treatment is tentatively planned for 06/22/19 pending Dr. Melven Sartorius availability. She will continue taking Keppra '1000mg'$  po BID and Dexamethasone '1mg'$  po daily. She has a scheduled follow up visit with Dr. Mickeal Skinner on 06/14/19 and will also continue in routine follow up with Stacey Cox for continued systemic disease management.  She knows to call at anytime in the interim with any questions or concerns related to her previous radiotherapy.  Given current concerns for patient exposure during the COVID-19 pandemic, this encounter was conducted via telephone. The patient has given verbal consent for this type of  encounter. I spent 25 minutes in telephone conversation with the patient and more than 50% of that time was spent in counseling and/or coordination of care.  The attendants for this meeting include Stacey Zeleznik PA-C, patient, Stacey Cox and her sister, Stacey Cox. During the encounter, Waylyn Tenbrink PA-C, was located at [facility] Radiation Oncology Department.  Patient, Stacey Cox and her sister, Stacey Cox were located at home.    Nicholos Johns, PA-C

## 2019-06-13 NOTE — Progress Notes (Signed)
°  Radiation Oncology         [phone] ________________________________  Name: Stacey Cox MRN: [id number]  Date: 06/13/2019  DOB: 09/16/1956  End of Treatment Note  Diagnosis:   63 y.o.female withprogressive dural metastasissecondary to metastatic breast cancer     Indication for treatment:  Palliative       Radiation treatment dates:   3/31, 4/3, 4/7, 4/10, 03/06/2019  Site/dose:   PTV2-Dura treated to 25Gy in 71fractions  Beams/energy:   SRS / 6X-FFF  Narrative: The patient tolerated radiation treatment relatively well. She denied onset of new neurologic symptoms, dizziness, and issues with gait. No evidence of thrush was noted. She was taking decadron as instructed by Dr. Mickeal Skinner.   Plan: The patient has completed radiation treatment. The patient will return to radiation oncology clinic for routine followup in one month. I advised her to call or return sooner if she has any questions or concerns related to her recovery or treatment. ________________________________  Sheral Apley. Tammi Klippel, M.D.   This document serves as a record of services personally performed by Tyler Pita, MD. It was created on his behalf by Wilburn Mylar, a trained medical scribe. The creation of this record is based on the scribe's personal observations and the provider's statements to them. This document has been checked and approved by the attending provider.

## 2019-06-14 ENCOUNTER — Other Ambulatory Visit: Payer: BC Managed Care – PPO

## 2019-06-14 ENCOUNTER — Other Ambulatory Visit: Payer: Self-pay

## 2019-06-14 ENCOUNTER — Inpatient Hospital Stay: Payer: BC Managed Care – PPO

## 2019-06-14 ENCOUNTER — Other Ambulatory Visit: Payer: Self-pay | Admitting: Hematology

## 2019-06-14 ENCOUNTER — Ambulatory Visit: Payer: BC Managed Care – PPO | Admitting: Hematology

## 2019-06-14 ENCOUNTER — Ambulatory Visit: Payer: BC Managed Care – PPO | Admitting: Radiation Oncology

## 2019-06-14 ENCOUNTER — Inpatient Hospital Stay (HOSPITAL_BASED_OUTPATIENT_CLINIC_OR_DEPARTMENT_OTHER): Payer: BC Managed Care – PPO | Admitting: Internal Medicine

## 2019-06-14 VITALS — BP 108/79 | HR 114 | Temp 97.7°F | Resp 18 | Ht 65.0 in | Wt 171.3 lb

## 2019-06-14 VITALS — HR 94

## 2019-06-14 DIAGNOSIS — C787 Secondary malignant neoplasm of liver and intrahepatic bile duct: Secondary | ICD-10-CM

## 2019-06-14 DIAGNOSIS — Z7189 Other specified counseling: Secondary | ICD-10-CM

## 2019-06-14 DIAGNOSIS — C7951 Secondary malignant neoplasm of bone: Secondary | ICD-10-CM

## 2019-06-14 DIAGNOSIS — C50812 Malignant neoplasm of overlapping sites of left female breast: Secondary | ICD-10-CM | POA: Diagnosis not present

## 2019-06-14 DIAGNOSIS — R739 Hyperglycemia, unspecified: Secondary | ICD-10-CM

## 2019-06-14 DIAGNOSIS — C9000 Multiple myeloma not having achieved remission: Secondary | ICD-10-CM

## 2019-06-14 DIAGNOSIS — Z95828 Presence of other vascular implants and grafts: Secondary | ICD-10-CM

## 2019-06-14 DIAGNOSIS — C7931 Secondary malignant neoplasm of brain: Secondary | ICD-10-CM | POA: Diagnosis not present

## 2019-06-14 DIAGNOSIS — Z5111 Encounter for antineoplastic chemotherapy: Secondary | ICD-10-CM | POA: Diagnosis not present

## 2019-06-14 DIAGNOSIS — C50919 Malignant neoplasm of unspecified site of unspecified female breast: Secondary | ICD-10-CM

## 2019-06-14 DIAGNOSIS — C7800 Secondary malignant neoplasm of unspecified lung: Secondary | ICD-10-CM

## 2019-06-14 DIAGNOSIS — Z170 Estrogen receptor positive status [ER+]: Secondary | ICD-10-CM

## 2019-06-14 LAB — CBC WITH DIFFERENTIAL/PLATELET
Abs Immature Granulocytes: 0.06 10*3/uL (ref 0.00–0.07)
Basophils Absolute: 0 10*3/uL (ref 0.0–0.1)
Basophils Relative: 1 %
Eosinophils Absolute: 0.1 10*3/uL (ref 0.0–0.5)
Eosinophils Relative: 2 %
HCT: 36 % (ref 36.0–46.0)
Hemoglobin: 11.6 g/dL — ABNORMAL LOW (ref 12.0–15.0)
Immature Granulocytes: 2 %
Lymphocytes Relative: 19 %
Lymphs Abs: 0.7 10*3/uL (ref 0.7–4.0)
MCH: 30.3 pg (ref 26.0–34.0)
MCHC: 32.2 g/dL (ref 30.0–36.0)
MCV: 94 fL (ref 80.0–100.0)
Monocytes Absolute: 0.3 10*3/uL (ref 0.1–1.0)
Monocytes Relative: 8 %
Neutro Abs: 2.6 10*3/uL (ref 1.7–7.7)
Neutrophils Relative %: 68 %
Platelets: 153 10*3/uL (ref 150–400)
RBC: 3.83 MIL/uL — ABNORMAL LOW (ref 3.87–5.11)
RDW: 17.5 % — ABNORMAL HIGH (ref 11.5–15.5)
WBC: 3.8 10*3/uL — ABNORMAL LOW (ref 4.0–10.5)
nRBC: 1.6 % — ABNORMAL HIGH (ref 0.0–0.2)

## 2019-06-14 LAB — GLUCOSE, CAPILLARY: Glucose-Capillary: 340 mg/dL — ABNORMAL HIGH (ref 70–99)

## 2019-06-14 LAB — CMP (CANCER CENTER ONLY)
ALT: 84 U/L — ABNORMAL HIGH (ref 0–44)
AST: 50 U/L — ABNORMAL HIGH (ref 15–41)
Albumin: 3.3 g/dL — ABNORMAL LOW (ref 3.5–5.0)
Alkaline Phosphatase: 250 U/L — ABNORMAL HIGH (ref 38–126)
Anion gap: 10 (ref 5–15)
BUN: 14 mg/dL (ref 8–23)
CO2: 27 mmol/L (ref 22–32)
Calcium: 9.4 mg/dL (ref 8.9–10.3)
Chloride: 99 mmol/L (ref 98–111)
Creatinine: 0.82 mg/dL (ref 0.44–1.00)
GFR, Est AFR Am: >60 mL/min (ref >60)
GFR, Estimated: >60 mL/min (ref >60)
Glucose, Bld: 395 mg/dL — ABNORMAL HIGH (ref 70–99)
Potassium: 3.7 mmol/L (ref 3.5–5.1)
Sodium: 136 mmol/L (ref 135–145)
Total Bilirubin: 0.5 mg/dL (ref 0.3–1.2)
Total Protein: 6.2 g/dL — ABNORMAL LOW (ref 6.5–8.1)

## 2019-06-14 MED ORDER — INSULIN REGULAR HUMAN 100 UNIT/ML IJ SOLN
8.0000 [IU] | Freq: Once | INTRAMUSCULAR | Status: AC
  Administered 2019-06-14: 8 [IU] via SUBCUTANEOUS
  Filled 2019-06-14: qty 10

## 2019-06-14 MED ORDER — PEGFILGRASTIM 6 MG/0.6ML ~~LOC~~ PSKT
6.0000 mg | PREFILLED_SYRINGE | Freq: Once | SUBCUTANEOUS | Status: AC
  Administered 2019-06-14: 6 mg via SUBCUTANEOUS

## 2019-06-14 MED ORDER — FAMOTIDINE IN NACL 20-0.9 MG/50ML-% IV SOLN
INTRAVENOUS | Status: AC
  Filled 2019-06-14: qty 50

## 2019-06-14 MED ORDER — PEGFILGRASTIM 6 MG/0.6ML ~~LOC~~ PSKT
PREFILLED_SYRINGE | SUBCUTANEOUS | Status: AC
  Filled 2019-06-14: qty 0.6

## 2019-06-14 MED ORDER — DIPHENHYDRAMINE HCL 50 MG/ML IJ SOLN
50.0000 mg | Freq: Once | INTRAMUSCULAR | Status: AC
  Administered 2019-06-14: 50 mg via INTRAVENOUS

## 2019-06-14 MED ORDER — SODIUM CHLORIDE 0.9 % IV SOLN
Freq: Once | INTRAVENOUS | Status: AC
  Administered 2019-06-14: 14:00:00 via INTRAVENOUS
  Filled 2019-06-14: qty 6

## 2019-06-14 MED ORDER — DIPHENHYDRAMINE HCL 50 MG/ML IJ SOLN
INTRAMUSCULAR | Status: AC
  Filled 2019-06-14: qty 1

## 2019-06-14 MED ORDER — HEPARIN SOD (PORK) LOCK FLUSH 100 UNIT/ML IV SOLN
500.0000 [IU] | Freq: Once | INTRAVENOUS | Status: AC | PRN
  Administered 2019-06-14: 500 [IU]
  Filled 2019-06-14: qty 5

## 2019-06-14 MED ORDER — FAMOTIDINE IN NACL 20-0.9 MG/50ML-% IV SOLN
20.0000 mg | Freq: Once | INTRAVENOUS | Status: AC
  Administered 2019-06-14: 20 mg via INTRAVENOUS

## 2019-06-14 MED ORDER — INSULIN REGULAR HUMAN 100 UNIT/ML IJ SOLN
6.0000 [IU] | Freq: Once | INTRAMUSCULAR | Status: AC
  Administered 2019-06-14: 6 [IU] via SUBCUTANEOUS
  Filled 2019-06-14: qty 10

## 2019-06-14 MED ORDER — SODIUM CHLORIDE 0.9 % IV SOLN
80.0000 mg/m2 | Freq: Once | INTRAVENOUS | Status: AC
  Administered 2019-06-14: 150 mg via INTRAVENOUS
  Filled 2019-06-14: qty 25

## 2019-06-14 MED ORDER — SITAGLIPTIN PHOSPHATE 50 MG PO TABS: 50.0000 mg | ORAL_TABLET | Freq: Every day | ORAL | 0 refills | Status: DC

## 2019-06-14 MED ORDER — SODIUM CHLORIDE 0.9% FLUSH
10.0000 mL | INTRAVENOUS | Status: DC | PRN
  Administered 2019-06-14: 10 mL
  Filled 2019-06-14: qty 10

## 2019-06-14 MED ORDER — SODIUM CHLORIDE 0.9 % IV SOLN
Freq: Once | INTRAVENOUS | Status: AC
  Administered 2019-06-14: 13:00:00 via INTRAVENOUS
  Filled 2019-06-14: qty 250

## 2019-06-14 MED ORDER — SODIUM CHLORIDE 0.9% FLUSH
10.0000 mL | Freq: Once | INTRAVENOUS | Status: AC
  Administered 2019-06-14: 12:00:00 10 mL
  Filled 2019-06-14: qty 10

## 2019-06-14 NOTE — Progress Notes (Signed)
[facility] [place], [place] [address] [phone]   Interval Evaluation  Date of Service: 06/14/19 Patient Name: Stacey Cox Patient MRN: [id number] Patient DOB: [birth year] Provider: Ventura Sellers, MD  Identifying Statement:  Stacey Cox is a 63 y.o. 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 [facility])  01/21/2017 Initial Diagnosis   Metastatic breast cancer ([place])   07/19/2018 - 11/20/2018 Chemotherapy   The patient had DOXOrubicin (ADRIAMYCIN) chemo injection 36 mg, 20 mg/m2 = 36 mg (66.7 % of original dose 30 mg/m2), Intravenous,  Once, 6 of 6 cycles Dose modification: 30 mg/m2 (original dose 30 mg/m2, Cycle 1, Reason: Change in LFTs), 20 mg/m2 (original dose 30 mg/m2, Cycle 1, Reason: Change in LFTs), 30 mg/m2 (original dose 30 mg/m2, Cycle 3, Reason: Change in LFTs), 50 mg/m2 (original dose 30 mg/m2, Cycle 4, Reason: Provider Judgment) Administration: 36 mg (07/19/2018), 36 mg (08/09/2018), 54 mg (08/30/2018), 90 mg (09/20/2018), 90 mg (10/10/2018), 90 mg (10/31/2018) palonosetron (ALOXI) injection 0.25 mg, 0.25 mg, Intravenous,  Once, 6 of 6 cycles Administration: 0.25 mg (07/19/2018), 0.25 mg (08/09/2018), 0.25 mg (08/30/2018), 0.25 mg (09/20/2018), 0.25 mg (10/10/2018), 0.25 mg (10/31/2018) pegfilgrastim-cbqv (UDENYCA) injection 6 mg, 6 mg, Subcutaneous, Once, 6 of 6 cycles Administration: 6 mg (08/11/2018), 6 mg (09/01/2018), 6 mg (09/22/2018), 6 mg (10/12/2018), 6 mg (11/01/2018) cyclophosphamide (CYTOXAN) 720 mg in sodium chloride 0.9 % 250 mL chemo infusion, 400 mg/m2 = 720 mg (100 % of original dose 400 mg/m2), Intravenous,  Once, 6 of 6 cycles Dose modification: 400 mg/m2 (original dose 400 mg/m2, Cycle 1, Reason: Change in LFTs), 500 mg/m2 (original dose 400 mg/m2, Cycle 3, Reason: Change in  LFTs) Administration: 720 mg (07/19/2018), 720 mg (08/09/2018), 900 mg (08/30/2018), 900 mg (09/20/2018), 900 mg (10/10/2018), 900 mg (10/31/2018) fosaprepitant (EMEND) 150 mg, dexamethasone (DECADRON) 12 mg in sodium chloride 0.9 % 145 mL IVPB, , Intravenous,  Once, 6 of 6 cycles Administration:  (07/19/2018),  (08/09/2018),  (08/30/2018),  (09/20/2018),  (10/10/2018),  (10/31/2018)  for chemotherapy treatment.    03/29/2019 -  Chemotherapy   The patient had palonosetron (ALOXI) injection 0.25 mg, 0.25 mg, Intravenous,  Once, 1 of 1 cycle Administration: 0.25 mg (03/29/2019), 0.25 mg (04/05/2019) pegfilgrastim (NEULASTA) injection 6 mg, 6 mg, Subcutaneous, Once, 1 of 1 cycle pegfilgrastim (NEULASTA ONPRO KIT) injection 6 mg, 6 mg, Subcutaneous, Once, 2 of 3 cycles Administration: 6 mg (05/17/2019) PACLitaxel (TAXOL) 150 mg in sodium chloride 0.9 % 250 mL chemo infusion (</= 53m/m2), 80 mg/m2 = 150 mg, Intravenous,  Once, 3 of 4 cycles Administration: 150 mg (03/29/2019), 150 mg (04/05/2019), 150 mg (05/03/2019), 150 mg (05/10/2019), 150 mg (05/17/2019), 150 mg (05/31/2019), 150 mg (06/07/2019) ondansetron (ZOFRAN) 12 mg, dexamethasone (DECADRON) 10 mg in sodium chloride 0.9 % 50 mL IVPB, , Intravenous,  Once, 2 of 3 cycles Administration:  (05/03/2019),  (05/10/2019),  (05/17/2019),  (05/31/2019),  (06/07/2019)  for chemotherapy treatment.    Malignant neoplasm metastatic to lung ([facility]  01/22/2017 Initial Diagnosis   M[place] to lung ([place]   03/29/2019 -  Chemotherapy   The patient had palonosetron (ALOXI) injection 0.25 mg, 0.25 mg, Intravenous,  Once, 1 of 4 cycles Administration: 0.25 mg (03/29/2019), 0.25 mg (04/05/2019) pegfilgrastim (NEULASTA) injection 6 mg, 6 mg, Subcutaneous, Once, 1 of 1 cycle pegfilgrastim (NEULASTA ONPRO KIT) injection 6 mg, 6  mg, Subcutaneous, Once, 0 of 3 cycles PACLitaxel (TAXOL) 150 mg in sodium chloride 0.9 % 250 mL chemo infusion (</= '80mg'$ /m2), 80 mg/m2 = 150 mg,  Intravenous,  Once, 1 of 4 cycles Administration: 150 mg (03/29/2019), 150 mg (04/05/2019)  for chemotherapy treatment.    Liver metastases ([place])  07/12/2018 Initial Diagnosis   Liver metastases ([place])   07/19/2018 - 11/20/2018 Chemotherapy   The patient had DOXOrubicin (ADRIAMYCIN) chemo injection 36 mg, 20 mg/m2 = 36 mg (66.7 % of original dose 30 mg/m2), Intravenous,  Once, 6 of 6 cycles Dose modification: 30 mg/m2 (original dose 30 mg/m2, Cycle 1, Reason: Change in LFTs), 20 mg/m2 (original dose 30 mg/m2, Cycle 1, Reason: Change in LFTs), 30 mg/m2 (original dose 30 mg/m2, Cycle 3, Reason: Change in LFTs), 50 mg/m2 (original dose 30 mg/m2, Cycle 4, Reason: Provider Judgment) Administration: 36 mg (07/19/2018), 36 mg (08/09/2018), 54 mg (08/30/2018), 90 mg (09/20/2018), 90 mg (10/10/2018), 90 mg (10/31/2018) palonosetron (ALOXI) injection 0.25 mg, 0.25 mg, Intravenous,  Once, 6 of 6 cycles Administration: 0.25 mg (07/19/2018), 0.25 mg (08/09/2018), 0.25 mg (08/30/2018), 0.25 mg (09/20/2018), 0.25 mg (10/10/2018), 0.25 mg (10/31/2018) pegfilgrastim-cbqv (UDENYCA) injection 6 mg, 6 mg, Subcutaneous, Once, 6 of 6 cycles Administration: 6 mg (08/11/2018), 6 mg (09/01/2018), 6 mg (09/22/2018), 6 mg (10/12/2018), 6 mg (11/01/2018) cyclophosphamide (CYTOXAN) 720 mg in sodium chloride 0.9 % 250 mL chemo infusion, 400 mg/m2 = 720 mg (100 % of original dose 400 mg/m2), Intravenous,  Once, 6 of 6 cycles Dose modification: 400 mg/m2 (original dose 400 mg/m2, Cycle 1, Reason: Change in LFTs), 500 mg/m2 (original dose 400 mg/m2, Cycle 3, Reason: Change in LFTs) Administration: 720 mg (07/19/2018), 720 mg (08/09/2018), 900 mg (08/30/2018), 900 mg (09/20/2018), 900 mg (10/10/2018), 900 mg (10/31/2018) fosaprepitant (EMEND) 150 mg, dexamethasone (DECADRON) 12 mg in sodium chloride 0.9 % 145 mL IVPB, , Intravenous,  Once, 6 of 6 cycles Administration:  (07/19/2018),  (08/09/2018),  (08/30/2018),  (09/20/2018),  (10/10/2018),   (10/31/2018)  for chemotherapy treatment.    03/29/2019 -  Chemotherapy   The patient had palonosetron (ALOXI) injection 0.25 mg, 0.25 mg, Intravenous,  Once, 1 of 4 cycles Administration: 0.25 mg (03/29/2019), 0.25 mg (04/05/2019) pegfilgrastim (NEULASTA) injection 6 mg, 6 mg, Subcutaneous, Once, 1 of 1 cycle pegfilgrastim (NEULASTA ONPRO KIT) injection 6 mg, 6 mg, Subcutaneous, Once, 0 of 3 cycles PACLitaxel (TAXOL) 150 mg in sodium chloride 0.9 % 250 mL chemo infusion (</= '80mg'$ /m2), 80 mg/m2 = 150 mg, Intravenous,  Once, 1 of 4 cycles Administration: 150 mg (03/29/2019), 150 mg (04/05/2019)  for chemotherapy treatment.    Breast cancer metastasized to brain [facility])  07/20/2018 Initial Diagnosis   Breast cancer metastasized to brain [facility])   03/29/2019 -  Chemotherapy   The patient had palonosetron (ALOXI) injection 0.25 mg, 0.25 mg, Intravenous,  Once, 1 of 4 cycles Administration: 0.25 mg (03/29/2019), 0.25 mg (04/05/2019) pegfilgrastim (NEULASTA) injection 6 mg, 6 mg, Subcutaneous, Once, 1 of 1 cycle pegfilgrastim (NEULASTA ONPRO KIT) injection 6 mg, 6 mg, Subcutaneous, Once, 0 of 3 cycles PACLitaxel (TAXOL) 150 mg in sodium chloride 0.9 % 250 mL chemo infusion (</= '80mg'$ /m2), 80 mg/m2 = 150 mg, Intravenous,  Once, 1 of 4 cycles Administration: 150 mg (03/29/2019), 150 mg (04/05/2019)  for chemotherapy treatment.      Interval History: Stacey Cox presents today for follow up after recent MRI brain.  She and her sister report recent staring spells, described as "staring into space" for 10-15 seconds, frequency  several times per week for past month.  Patient does not seem to have impaired awareness and reports full memory of events.  No post event confusion.  Otherwise no new or progressive neurologic deficits, modest memory impairment which is stable. She continues to take decadron '1mg'$  daily as prior.  Currently undergoing taxol therapy for progressive breast ca with Dr. Irene Limbo.  (H+P) 08/01/18:  Stacey Cox presented last week with two witnessed episodes of generalized shaking, eyes open, consistent with generalized seizure.  She had just two days prior been dosed with first cycle of doxorubicin and cyclophosphamide for progressive hormone positive breast cancer.  She denied any neurologic symptoms prior to the seizures, and after a day inpatient was back to her baseline.  MRI did demonstrate a metastatic focus with an enhancing dural tail.  She was started on decadron '4mg'$  daily and had repeat MRI few days ago.  At this time she has no neurologic complaints.    Medications: Current Outpatient Medications on File Prior to Visit  Medication Sig Dispense Refill   aspirin EC 81 MG tablet Take 1 tablet (81 mg total) by mouth daily.     chlorhexidine (PERIDEX) 0.12 % solution Use as directed 15 mLs in the mouth or throat 2 (two) times daily as needed (mouth rinse after vomiting). Swish and spit 473 mL 1   dexamethasone (DECADRON) 1 MG tablet TAKE 1 TABLET(1 MG) BY MOUTH DAILY 30 tablet 2   escitalopram (LEXAPRO) 20 MG tablet TAKE 1 TABLET(20 MG) BY MOUTH DAILY 30 tablet 0   feeding supplement, ENSURE ENLIVE, (ENSURE ENLIVE) LIQD Take 237 mLs by mouth 2 (two) times daily between meals. (Patient taking differently: Take 237 mLs by mouth daily. ) 237 mL 12   hydrOXYzine (ATARAX/VISTARIL) 10 MG tablet Take 10 mg by mouth 2 (two) times daily.     levETIRAcetam (KEPPRA) 1000 MG tablet Take 1 tablet (1,000 mg total) by mouth 2 (two) times daily. 60 tablet 5   pantoprazole (PROTONIX) 40 MG tablet TAKE 1 TABLET(40 MG) BY MOUTH DAILY 30 tablet 1   triamcinolone (KENALOG) 0.025 % ointment Apply 1 application topically 2 (two) times daily. Please in thin layer gently (without rubbing) over eczematoid rash over the left arm 30 g 0   Vitamin D, Ergocalciferol, (DRISDOL) 1.25 MG (50000 UT) CAPS capsule TAKE 1 CAPSULE BY MOUTH 3 TIMES WEEKLY 24 capsule 0   Current Facility-Administered Medications on File  Prior to Visit  Medication Dose Route Frequency Provider Last Rate Last Dose   denosumab (XGEVA) injection 120 mg  120 mg Subcutaneous Once [name], MD       fulvestrant (FASLODEX) injection 500 mg  500 mg Intramuscular Once [name], MD        Allergies:  Allergies  Allergen Reactions   Thorazine [Chlorpromazine] Anaphylaxis   Ribociclib Other (See Comments)    Exfoliative dermatitis   Past Medical History:  Past Medical History:  Diagnosis Date   Cancer ([place])    Metastatic Breast Cancer   Past Surgical History:  Past Surgical History:  Procedure Laterality Date   CHOLECYSTECTOMY     IR IMAGING GUIDED PORT INSERTION  07/14/2018   TONSILLECTOMY     Social History:  Social History   Socioeconomic History   Marital status: Single    Spouse name: Not on file   Number of children: Not on file   Years of education: Not on file   Highest education level: Not on file  Occupational  History   Not on file  Social Needs   Financial resource strain: Not on file   Food insecurity    Worry: Not on file    Inability: Not on file   Transportation needs    Medical: Not on file    Non-medical: Not on file  Tobacco Use   Smoking status: Current Every Day Smoker    Packs/day: 0.15    Types: Cigarettes    Last attempt to quit: 01/21/2017    Years since quitting: 2.3   Smokeless tobacco: Never Used  Substance and Sexual Activity   Alcohol use: No    Alcohol/week: 0.0 standard drinks   Drug use: No   Sexual activity: Never  Lifestyle   Physical activity    Days per week: Not on file    Minutes per session: Not on file   Stress: Not on file  Relationships   Social connections    Talks on phone: Not on file    Gets together: Not on file    Attends religious service: Not on file    Active member of club or organization: Not on file    Attends meetings of clubs or organizations: Not on file    Relationship status: Not on file    Intimate partner violence    Fear of current or ex partner: No    Emotionally abused: No    Physically abused: No    Forced sexual activity: No  Other Topics Concern   Not on file  Social History Narrative   Lives alone. 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 Has phobia of the medical system.   Family History:  Family History  Problem Relation Age of Onset   Cancer Mother        breast   Cancer Sister        breast    Review of Systems: Constitutional: Denies fevers, chills or abnormal weight loss.  +Poor appetite Eyes: Denies blurriness of vision Ears, nose, mouth, throat, and face: Denies mucositis or sore throat Respiratory: Denies cough, dyspnea or wheezes Cardiovascular: Denies palpitation, chest discomfort or lower extremity swelling Gastrointestinal:  Denies nausea GU: Denies dysuria or incontinence Skin: Denies abnormal skin rashes Neurological: Per HPI Musculoskeletal: Denies joint pain, back or neck discomfort. No decrease in ROM Behavioral/Psych: Denies anxiety, disturbance in thought content, and mood instability   Physical Exam: Vitals:   06/14/19 0939  BP: 108/79  Pulse: (!) 114  Resp: 18  Temp: 97.7 F (36.5 C)  SpO2: 100%   KPS: 90. General: Alert, cooperative, pleasant, in no acute distress Head: Craniotomy scar noted, dry and intact. EENT: No conjunctival injection or scleral icterus. Oral mucosa moist Lungs: Resp effort normal Cardiac: Regular rate and rhythm Abdomen: Soft, non-distended abdomen Skin: Normal Extremities: No clubbing or edema  Neurologic Exam: Mental Status: Awake, alert, attentive to examiner. Oriented to self and environment. Language is fluent with intact comprehension.  Cranial Nerves: Visual acuity is grossly normal. Visual fields are full. Extra-ocular movements intact. No ptosis. Face is symmetric, tongue midline. Motor: Tone and bulk are normal. Power is full in both arms and legs. Reflexes are symmetric, no pathologic  reflexes present. Intact finger to nose bilaterally Sensory: Intact to light touch and temperature Gait: Normal and tandem gait is deferred  Labs: I have reviewed the data as listed    Component Value Date/Time   NA 136 06/07/2019 0810   NA 139 11/24/2017 1340   K 3.5 06/07/2019 0810   K 3.7  11/24/2017 1340   CL 98 06/07/2019 0810   CO2 25 06/07/2019 0810   CO2 25 11/24/2017 1340   GLUCOSE 355 (H) 06/07/2019 0810   GLUCOSE 83 11/24/2017 1340   BUN 17 06/07/2019 0810   BUN 9.7 11/24/2017 1340   CREATININE 0.80 06/07/2019 0810   CREATININE 1.2 (H) 11/24/2017 1340   CALCIUM 9.4 06/07/2019 0810   CALCIUM 9.3 11/24/2017 1340   PROT 6.4 (L) 06/07/2019 0810   PROT 7.1 11/24/2017 1340   ALBUMIN 3.2 (L) 06/07/2019 0810   ALBUMIN 3.8 11/24/2017 1340   AST 50 (H) 06/07/2019 0810   AST 29 11/24/2017 1340   ALT 76 (H) 06/07/2019 0810   ALT 35 11/24/2017 1340   ALKPHOS 258 (H) 06/07/2019 0810   ALKPHOS 73 11/24/2017 1340   BILITOT 0.5 06/07/2019 0810   BILITOT 0.40 11/24/2017 1340   GFRNONAA >60 06/07/2019 0810   GFRAA >60 06/07/2019 0810   Lab Results  Component Value Date   WBC 6.3 06/07/2019   NEUTROABS 4.4 06/07/2019   HGB 12.0 06/07/2019   HCT 36.5 06/07/2019   MCV 92.9 06/07/2019   PLT 200 06/07/2019    Imaging:  [place] Clinician Interpretation: I have personally reviewed the CNS images as listed.  My interpretation, in the context of the patient's clinical presentation, is progressive disease  Mr Jeri Cos Wo Contrast  Result Date: 06/08/2019 CLINICAL DATA:  Breast cancer with dural metastases. Most recent stereotactic radiotherapy 02/20/2019 EXAM: MRI HEAD WITHOUT AND WITH CONTRAST TECHNIQUE: Multiplanar, multiecho pulse sequences of the brain and surrounding structures were obtained without and with intravenous contrast. CONTRAST:  66m MULTIHANCE GADOBENATE DIMEGLUMINE 529 MG/ML IV SOLN COMPARISON:  02/20/2019 FINDINGS: BRAIN New Lesions: 22 x 26 mm enhancing lesion  located along the low left dura overlying the transverse sigmoid junction with mild surrounding edema, seen on 13:43. Larger lesions: None. Stable or Smaller lesions: Malignant dural thickening along the left more than right cerebral convexities with intermittent nodularity contacting the brain surface. These areas are diffusely improved with index measurements below: 1. A nodule along the high a left frontal convexity has decreased from 26 x 14 mm to 16 x 9 mm on 13:124 2. A nodule along the high right frontal convexity previously measured 15 x 10 mm and is now only wispy enhancement extending into the adjacent sulcus on 13:124. 3. Dominant nodule along the greater wing left sphenoid, at the upper sphenoid parietal sinus, previously 35 x 17 mm and currently 26 x 7 mm on 13:72. Other Brain findings: Significantly improved brain edema. Marked bilateral inferior cerebellar atrophy. No acute hemorrhage, hydrocephalus, collection. Stable meningioma in the low right posterior fossa just posterior to the jugular foramen, 14 mm in diameter. This has been stable since 2018. Also stable is a no other presumed meningioma at the right prepontine cistern measuring 7 mm. Vascular: Major flow voids and vascular enhancements are preserved, including the left transverse sigmoid sinus. Skull and upper cervical spine: Known metastasis in the C4 posterior body. Patchy sclerotic metastases in the calvarium that appears similar. Sinuses/Orbits: Negative IMPRESSION: 1. Progression of disease disease as evidenced by new dural metastasis along the inferolateral left temporal lobe with mild adjacent brain edema, see image 13:43. 2. Positive response at the intervally treated diffuse dural metastatic disease elsewhere along the bilateral cerebral convexity. 3. Posterior fossa meningiomas are stable. 4. No primary parenchymal metastasis. Electronically Signed   By: JMonte FantasiaM.D.   On: 06/08/2019 04:22     Assessment/Plan  1. Brain  metastases ([place])  2. Seizures ([place])  Stacey Cox presents today with relative clinical stability, but progressive disease on MRI brain.  There is new region of enhancement along the dura adjacent to inferior left temporal lobe, outside prior RT fields.  Of note, previously treated dual based metastases have responded and regressed considerably.    Her case was discussed on Monday in Brain and Spine Tumor Board; recommendation was made for additional radiosurgery, likely in single fraction given well circumscribed lesion volume.  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  Staring events are either epileptic or behavioral, patient full recall of events inconsistent with typical absence seizures; for now recommend continuing Keppra 1539m BID and continuing to monitor.  Asked sister to administer "code word" during future events to help objectify degree of impaired awareness.  We recommended she continue decadron at 142mdaily until 1-2 weeks following radiosurgery.  At that time she may discontinue decadron if tolerated.  We appreciate the opportunity to participate in the care of Stacey Cox Arrangements have been made for consultation and simulation with radiation oncology.  All questions were answered. The patient knows to call the clinic with any problems, questions or concerns. No barriers to learning were detected.  The total time spent in the encounter was 25 minutes and more than 50% was on counseling and review of test results   ZaVentura SellersMD Medical Director of Neuro-Oncology [facility]East [facility] WeYulee7/23/20 9:29 AM

## 2019-06-14 NOTE — Patient Instructions (Signed)
[facility] Discharge Instructions for Patients Receiving Chemotherapy  Today you received the following chemotherapy agents: Taxol  To help prevent nausea and vomiting after your treatment, we encourage you to take your nausea medication as directed.    If you develop nausea and vomiting that is not controlled by your nausea medication, call the clinic.   BELOW ARE SYMPTOMS THAT SHOULD BE REPORTED IMMEDIATELY:  *FEVER GREATER THAN 100.5 F  *CHILLS WITH OR WITHOUT FEVER  NAUSEA AND VOMITING THAT IS NOT CONTROLLED WITH YOUR NAUSEA MEDICATION  *UNUSUAL SHORTNESS OF BREATH  *UNUSUAL BRUISING OR BLEEDING  TENDERNESS IN MOUTH AND THROAT WITH OR WITHOUT PRESENCE OF ULCERS  *URINARY PROBLEMS  *BOWEL PROBLEMS  UNUSUAL RASH Items with * indicate a potential emergency and should be followed up as soon as possible.  Feel free to call the clinic should you have any questions or concerns. The clinic phone number is [phone].  Please show the [place] at check-in to the Emergency Department and triage nurse.  Coronavirus (COVID-19) Are you at risk?  Are you at risk for the Coronavirus (COVID-19)?  To be considered HIGH RISK for Coronavirus (COVID-19), you have to meet the following criteria:  . Traveled to [place], [place], [place], [place] or [place]; or in the [place] to [place], [place], [place], or [place]; and have fever, cough, and shortness of breath within the last 2 weeks of travel OR . Been in close contact with a person diagnosed with COVID-19 within the last 2 weeks and have fever, cough, and shortness of breath . IF YOU DO NOT MEET THESE CRITERIA, YOU ARE CONSIDERED LOW RISK FOR COVID-19.  What to do if you are HIGH RISK for COVID-19?  [name] If you are having a medical emergency, call 911. . Seek medical care right away. Before you go to a doctor's office, urgent care or emergency department, call ahead and tell them about your recent  travel, contact with someone diagnosed with COVID-19, and your symptoms. You should receive instructions from your physician's office regarding next steps of care.  . When you arrive at healthcare provider, tell the healthcare staff immediately you have returned from visiting [place], [place], [place], [place] or [place]; or traveled in the [place] to [place], [place], [place], or [place]; in the last two weeks or you have been in close contact with a person diagnosed with COVID-19 in the last 2 weeks.   . Tell the health care staff about your symptoms: fever, cough and shortness of breath. . After you have been seen by a medical provider, you will be either: o Tested for (COVID-19) and discharged home on quarantine except to seek medical care if symptoms worsen, and asked to  - Stay home and avoid contact with others until you get your results (4-5 days)  - Avoid travel on public transportation if possible (such as bus, train, or airplane) or o Sent to the Emergency Department by EMS for evaluation, COVID-19 testing, and possible admission depending on your condition and test results.  What to do if you are LOW RISK for COVID-19?  Reduce your risk of any infection by using the same precautions used for avoiding the common cold or flu:  [name] Wash your hands often with soap and warm water for at least 20 seconds.  If soap and water are not readily available, use an alcohol-based hand sanitizer with at least 60% alcohol.  . If coughing or  sneezing, cover your mouth and nose by coughing or sneezing into the elbow areas of your shirt or coat, into a tissue or into your sleeve (not your hands). . Avoid shaking hands with others and consider head nods or verbal greetings only. . Avoid touching your eyes, nose, or mouth with unwashed hands.  . Avoid close contact with people who are sick. . Avoid places or events with large numbers of people in one location, like concerts or sporting  events. . Carefully consider travel plans you have or are making. . If you are planning any travel outside or inside the [place], visit the CDC's Travelers' Health webpage for the latest health notices. . If you have some symptoms but not all symptoms, continue to monitor at home and seek medical attention if your symptoms worsen. . If you are having a medical emergency, call 911.   [place] / e-Visit: [email or web address]         [facility]: [facility]: [phone]                   [facility]: [phone]

## 2019-06-14 NOTE — Telephone Encounter (Signed)
Dr. Irene Limbo gave verbal order from Home OT as described. Contacted Mr. Edwina Barth and left this message on secure voice mail

## 2019-06-14 NOTE — Progress Notes (Signed)
Per Dr Irene Limbo ok to tx w/ ALT 84. Re: blood sugar of 395 today, pt is to receive 8 units of Regular insulin and to have her blood sugar rechecked before she leaves tx today.    Pt Xgeva infusion is on hold for now due to Pt  receiving dental care currently, will return to dentist again next week.   Recheck of CBG @1548  was 340. Per Dr Irene Limbo pt to receive 6 units more of regular insulin.

## 2019-06-15 ENCOUNTER — Other Ambulatory Visit: Payer: Self-pay

## 2019-06-15 ENCOUNTER — Telehealth: Payer: Self-pay | Admitting: Internal Medicine

## 2019-06-15 ENCOUNTER — Ambulatory Visit
Admission: RE | Admit: 2019-06-15 | Discharge: 2019-06-15 | Disposition: A | Discharge status: Home / Self Care | Payer: BC Managed Care – PPO | Source: Ambulatory Visit | Attending: Radiation Oncology | Admitting: Radiation Oncology

## 2019-06-15 ENCOUNTER — Telehealth: Payer: Self-pay | Admitting: *Deleted

## 2019-06-15 VITALS — BP 129/99 | HR 113 | Temp 98.5°F | Resp 20 | Ht 65.0 in | Wt 173.5 lb

## 2019-06-15 VITALS — BP 129/99 | HR 113 | Temp 98.5°F | Resp 20

## 2019-06-15 DIAGNOSIS — Z510 Encounter for antineoplastic radiation therapy: Secondary | ICD-10-CM | POA: Insufficient documentation

## 2019-06-15 DIAGNOSIS — C50919 Malignant neoplasm of unspecified site of unspecified female breast: Secondary | ICD-10-CM

## 2019-06-15 DIAGNOSIS — C7931 Secondary malignant neoplasm of brain: Secondary | ICD-10-CM | POA: Insufficient documentation

## 2019-06-15 DIAGNOSIS — C50912 Malignant neoplasm of unspecified site of left female breast: Secondary | ICD-10-CM | POA: Insufficient documentation

## 2019-06-15 DIAGNOSIS — C7951 Secondary malignant neoplasm of bone: Secondary | ICD-10-CM | POA: Diagnosis not present

## 2019-06-15 DIAGNOSIS — Z170 Estrogen receptor positive status [ER+]: Secondary | ICD-10-CM | POA: Diagnosis not present

## 2019-06-15 MED ORDER — SODIUM CHLORIDE 0.9% FLUSH
10.0000 mL | Freq: Once | INTRAVENOUS | Status: AC
  Administered 2019-06-15: 10 mL via INTRAVENOUS

## 2019-06-15 MED ORDER — HEPARIN SOD (PORK) LOCK FLUSH 100 UNIT/ML IV SOLN
500.0000 [IU] | Freq: Once | INTRAVENOUS | Status: AC
  Administered 2019-06-15: 500 [IU] via INTRAVENOUS

## 2019-06-15 NOTE — Telephone Encounter (Signed)
No los per 7/23. °

## 2019-06-15 NOTE — Progress Notes (Signed)
Has armband been applied?  Yes.    Does patient have an allergy to IV contrast dye?: No.   Has patient ever received premedication for IV contrast dye?: No.   Does patient take metformin?: No.  If patient does take metformin when was the last dose: n/a  Date of lab work: 06/14/2019 BUN: 14 CR: 0.82  IV site: subclavian right, condition no redness, patent  Has IV site been added to flowsheet?  Yes.    BP (!) 129/99   Pulse (!) 113   Temp 98.5 F (36.9 C)   Resp 20

## 2019-06-15 NOTE — Progress Notes (Signed)
  Radiation Oncology         [phone] ________________________________  Name: Stacey Cox MRN: [id number]  Date: 06/15/2019  DOB: 10-21-1956  SIMULATION AND TREATMENT PLANNING NOTE    ICD-10-CM   1. Brain metastasis ([place])  C79.31     DIAGNOSIS:  63 yo woman with a single enlarging left dural based brain metastasis  NARRATIVE:  The patient was brought to the [facility].  Identity was confirmed.  All relevant records and images related to the planned course of therapy were reviewed.  The patient freely provided informed written consent to proceed with treatment after reviewing the details related to the planned course of therapy. The consent form was witnessed and verified by the simulation staff. Intravenous access was established for contrast administration. Then, the patient was set-up in a stable reproducible supine position for radiation therapy.  A relocatable thermoplastic stereotactic head frame was fabricated for precise immobilization.  CT images were obtained.  Surface markings were placed.  The CT images were loaded into the planning software and fused with the patient's targeting MRI scan.  Then the target and avoidance structures were contoured.  Treatment planning then occurred.  The radiation prescription was entered and confirmed.  I have requested 3D planning  I have requested a DVH of the following structures: Brain stem, brain, left eye, right eye, lenses, optic chiasm, target volumes, uninvolved brain, and normal tissue.    SPECIAL TREATMENT PROCEDURE:  The planned course of therapy using radiation constitutes a special treatment procedure. Special care is required in the management of this patient for the following reasons. This treatment constitutes a Special Treatment Procedure for the following reason: High dose per fraction requiring special monitoring for increased toxicities of treatment including daily imaging.  The special nature of the planned  course of radiotherapy will require increased physician supervision and oversight to ensure patient's safety with optimal treatment outcomes.  PLAN:  The patient will receive 18 Gy in 1 fraction.  ________________________________  Sheral Apley Tammi Klippel, M.D.

## 2019-06-15 NOTE — Progress Notes (Signed)
Access right subclavian power port per protocol at 1015 today in preparation for Stacey Cox simulation. Patient tolerated this well. Site provided excellent blood return and flushed without difficulty. Once simulation was complete I flushed the site per protocol. Access needle removed at 1108 today. Needle intact upon removal. Applied a bandaid to old access site. Patient tolerated all this very well.

## 2019-06-15 NOTE — Telephone Encounter (Signed)
Contacted patient. Spoke with sister Claiborne Billings (on [place]) as patient in [facility] with Dr. Grier Mitts instructions for patient: 1) Endocrinology consult ordered for her related to her elevated blood glucose 2) Prescription for Januvia sent to her pharmacy. It will help lower blood glucose. Continue checking CBGs at home 3) Home health will assist with monitoring CBGs and CBG results.    Contacted Clarita Leber, RN w/[facility]. Gave verbal order from Dr.Kale to have home health assist patient to monitor CBGs, informing her that Januvia was ordered by Dr. Irene Limbo. Dina verbalized understanding and stated she would add order to patient's care.

## 2019-06-19 ENCOUNTER — Telehealth: Payer: Self-pay | Admitting: *Deleted

## 2019-06-19 DIAGNOSIS — C7931 Secondary malignant neoplasm of brain: Secondary | ICD-10-CM | POA: Diagnosis not present

## 2019-06-19 NOTE — Telephone Encounter (Signed)
Patient called to inquire about referral to endocrinologist placed by Dr.Kale. Informed her it was with Dr. Maretta Bees' Shamleffer with [facility] [phone]. She states she will contact their office if she does not hear from them in another day or two.  Patient also asked that Dr.Kale be informed that shew as seeing the dentist for the final check tomorrow and that the dentis will send/fax over the results of the check up so she can resume Xgeva. Advised her that Dr. Irene Limbo will be informed.

## 2019-06-19 NOTE — Progress Notes (Signed)
  Radiation Oncology         [phone] ________________________________  Name: Stacey Cox MRN: [id number]  Date: 03/06/2019  DOB: 06-Aug-1956  End of Treatment Note  Diagnosis:   63 y.o. woman with progressive dural metastases from multifocal left breast cancer     Indication for treatment:  Palliative       Radiation treatment dates:   3/25-4/14/20   Site/dose/beams/energy:   Bilateral brain metastes targets were treated using 5 Rapid Arc VMAT Beams to a fractional prescription dose of 5 Gy to be repeated 5 times for a total of 25 Gy.  ExacTrac registration was performed for each couch angle.  The 100% isodose line was prescribed.  6 MV X-rays were delivered in the flattening filter free beam mode.  Narrative: The patient tolerated radiation treatment relatively well.   No complications were observed.  Plan: The patient has completed radiation treatment. The patient will return to radiation oncology clinic for routine followup in one month. I advised her to call or return sooner if she has any questions or concerns related to her recovery or treatment. ________________________________  Sheral Apley. Tammi Klippel, M.D.

## 2019-06-22 ENCOUNTER — Ambulatory Visit
Admission: RE | Admit: 2019-06-22 | Discharge: 2019-06-22 | Disposition: A | Discharge status: Home / Self Care | Payer: BC Managed Care – PPO | Source: Ambulatory Visit | Attending: Radiation Oncology | Admitting: Radiation Oncology

## 2019-06-22 ENCOUNTER — Other Ambulatory Visit: Payer: Self-pay

## 2019-06-22 ENCOUNTER — Encounter: Payer: Self-pay | Admitting: Radiation Oncology

## 2019-06-22 DIAGNOSIS — C7931 Secondary malignant neoplasm of brain: Secondary | ICD-10-CM

## 2019-06-22 NOTE — Progress Notes (Signed)
Nurse monitoring complete following SRS treatment. Vitals stable. Patient denies pain. Patient alert and oriented x 3. No distress noted. Denies development of new neurologic symptoms. Instructed patient to phone [phone] and request the radiation oncologist on call with any symptoms or concerns related to her treatment today. Instructed patient to avoid strenuous activity for the next 24 hours. Patient verbalized understanding of all reviewed via [place]. Patient discharged home. Patient exited clinic with ambulatory with a steady gait.  BP (P) 115/79   Pulse (P) 96   Temp (P) 98.7 F (37.1 C) (Oral)   Resp (P) 18   SpO2 (P) 98%

## 2019-06-22 NOTE — Progress Notes (Signed)
  Name: Stacey Cox  MRN: [id number]  Date: 06/22/2019   DOB: 08/13/1956  Stereotactic Radiosurgery Operative Note  PRE-OPERATIVE DIAGNOSIS:  Solitary Brain Metastasis  POST-OPERATIVE DIAGNOSIS:  Solitary Brain Metastasis  PROCEDURE:  Stereotactic Radiosurgery  SURGEON:  Peggyann Shoals, MD  NARRATIVE: The patient underwent a radiation treatment planning session in the radiation oncology simulation suite under the care of the radiation oncology physician and physicist.  I participated closely in the radiation treatment planning afterwards. The patient underwent planning CT which was fused to 3T high resolution MRI with 1 mm axial slices.  These images were fused on the planning system.  We contoured the gross target volumes and subsequently expanded this to yield the Planning Target Volume. I actively participated in the planning process.  I helped to define and review the target contours and also the contours of the optic pathway, eyes, brainstem and selected nearby organs at risk.  All the dose constraints for critical structures were reviewed and compared to AAPM Task Group 101.  The prescription dose conformity was reviewed.  I approved the plan electronically.    Accordingly, [name] was brought to the TrueBeam stereotactic radiation treatment linac and placed in the custom immobilization mask.  The patient was aligned according to the IR fiducial markers with BrainLab Exactrac, then orthogonal x-rays were used in ExacTrac with the 6DOF robotic table and the shifts were made to align the patient  [name] received stereotactic radiosurgery uneventfully.    The detailed description of the procedure is recorded in the radiation oncology procedure note.  I was present for the duration of the procedure.  DISPOSITION:  Following delivery, the patient was transported to nursing in stable condition and monitored for possible acute effects to be discharged to home in stable condition  with follow-up in one month.  Peggyann Shoals, MD 06/22/2019 5:25 PM

## 2019-06-23 NOTE — Progress Notes (Signed)
  Radiation Oncology         [phone] ________________________________  Stereotactic Treatment Procedure Note  Name: Stacey Cox MRN: [id number]  Date: 06/22/2019  DOB: Mar 21, 1956  SPECIAL TREATMENT PROCEDURE    ICD-10-CM   1. Brain metastasis ([place])  C79.31     3D TREATMENT PLANNING AND DOSIMETRY:  The patient's radiation plan was reviewed and approved by neurosurgery and radiation oncology prior to treatment.  It showed 3-dimensional radiation distributions overlaid onto the planning CT/MRI image set.  [facility] for the target structures as well as the organs at risk were reviewed. The documentation of the 3D plan and dosimetry are filed in the radiation oncology EMR.  NARRATIVE:  Stacey Cox was brought to the TrueBeam stereotactic radiation treatment machine and placed supine on the CT couch. The head frame was applied, and the patient was set up for stereotactic radiosurgery.  Neurosurgery was present for the set-up and delivery  SIMULATION VERIFICATION:  In the couch zero-angle position, the patient underwent Exactrac imaging using the Brainlab system with orthogonal KV images.  These were carefully aligned and repeated to confirm treatment position for each of the isocenters.  The Exactrac snap film verification was repeated at each couch angle.  PROCEDURE: Stacey Cox received stereotactic radiosurgery to the following targets: Left dural 26 mm target was treated using 4 Rapid Arc VMAT Beams to a prescription dose of 18 Gy.  ExacTrac registration was performed for each couch angle.  The 100% isodose line was prescribed.  6 MV X-rays were delivered in the flattening filter free beam mode.  STEREOTACTIC TREATMENT MANAGEMENT:  Following delivery, the patient was transported to nursing in stable condition and monitored for possible acute effects.  Vital signs were recorded BP (P) 115/79   Pulse (P) 96   Temp (P) 98.7 F (37.1 C) (Oral)   Resp (P) 18   SpO2 (P) 98% . The  patient tolerated treatment without significant acute effects, and was discharged to home in stable condition.    PLAN: Follow-up in one month.  ________________________________  Sheral Apley. Tammi Klippel, M.D.

## 2019-06-24 NOTE — Progress Notes (Addendum)
  Radiation Oncology         [phone] ________________________________  Name: Stacey Cox MRN: [id number]  Date: 06/22/2019  DOB: 24-Jun-1956  End of Treatment Note  Diagnosis:   63 yo woman with a single enlarging left dural based brain metastasis     Indication for treatment:  Palliation       Radiation treatment dates:   06/22/19  Site/dose/beams/energy:   Left dural 26 mm target was treated using 4 Rapid Arc VMAT Beams to a prescription dose of 18 Gy.  ExacTrac registration was performed for each couch angle.  The 100% isodose line was prescribed.  6 MV X-rays were delivered in the flattening filter free beam mode.  Narrative: The patient tolerated radiation treatment relatively well.   No complications occurred.  Plan: The patient has completed radiation treatment. The patient will return to radiation oncology clinic for routine followup in one month. I advised her to call or return sooner if she has any questions or concerns related to her recovery or treatment. ________________________________  Sheral Apley. Tammi Klippel, M.D.

## 2019-06-25 ENCOUNTER — Ambulatory Visit: Payer: BC Managed Care – PPO | Admitting: Internal Medicine

## 2019-06-25 DIAGNOSIS — Z0289 Encounter for other administrative examinations: Secondary | ICD-10-CM

## 2019-06-25 NOTE — Progress Notes (Deleted)
Name: Stacey Cox  MRN/ DOB: [id number], 11-25-1955   Age/ Sex: 63 y.o., female    PCP: Patient, No Pcp Per   Reason for Endocrinology Evaluation: Type {NUMBERS 1 OR 2:522190} Diabetes Mellitus     Date of Initial Endocrinology Visit: 06/25/2019     PATIENT IDENTIFIER: Stacey Cox is a 63 y.o. female with a past medical history of Metastatic Breast Ca . The patient presented for initial endocrinology clinic visit on 06/25/2019 for consultative assistance with her diabetes management.    HPI: Stacey Cox was    With Breast Ca with brain and bone mets. She was noted with hyperglycemia in 05/2019 with a BG's > 200 mg/dL  Prior Medications tried/Intolerance: *** Currently checking blood sugars *** x / day,  before breakfast and ***.  Hypoglycemia episodes : ***               Symptoms: ***                 Frequency: ***/  Hemoglobin A1c has ranged from *** in ***, peaking at *** in ***. Patient required assistance for hypoglycemia:  Patient has required hospitalization within the last 1 year from hyper or hypoglycemia:   In terms of diet, the patient ***   HOME DIABETES REGIMEN: Januvia      METER DOWNLOAD SUMMARY: Date range evaluated: *** Fingerstick Blood Glucose Tests = *** Average Number Tests/Day = *** Overall Mean FS Glucose = *** Standard Deviation = ***  BG Ranges: Low = *** High = ***   Hypoglycemic Events/30 Days: BG < 50 = *** Episodes of symptomatic severe hypoglycemia = ***   DIABETIC COMPLICATIONS: Microvascular complications:   ***  Denies: ***  Last eye exam: Completed   Macrovascular complications:   ***  Denies: CAD, PVD, CVA   PAST HISTORY: Past Medical History:  Past Medical History:  Diagnosis Date  . Cancer [facility])    Metastatic Breast Cancer    Past Surgical History:  Past Surgical History:  Procedure Laterality Date  . CHOLECYSTECTOMY    . IR IMAGING GUIDED PORT INSERTION  07/14/2018  . TONSILLECTOMY        Social  History:  reports that she has been smoking cigarettes. She has been smoking about 0.15 packs per day. She has never used smokeless tobacco. She reports that she does not drink alcohol or use drugs. Family History:  Family History  Problem Relation Age of Onset  . Cancer Mother        breast  . Cancer Sister        breast      HOME MEDICATIONS: Allergies as of 06/25/2019      Reactions   Thorazine [chlorpromazine] Anaphylaxis   Ribociclib Other (See Comments)   Exfoliative dermatitis      Medication List       Accurate as of June 25, 2019 12:56 PM. If you have any questions, ask your nurse or doctor.        aspirin EC 81 MG tablet Take 1 tablet (81 mg total) by mouth daily.   chlorhexidine 0.12 % solution Commonly known as: PERIDEX Use as directed 15 mLs in the mouth or throat 2 (two) times daily as needed (mouth rinse after vomiting). Swish and spit   dexamethasone 1 MG tablet Commonly known as: DECADRON TAKE 1 TABLET(1 MG) BY MOUTH DAILY   escitalopram 20 MG tablet Commonly known as: LEXAPRO TAKE 1 TABLET(20 MG) BY MOUTH DAILY   feeding  supplement (ENSURE ENLIVE) Liqd Take 237 mLs by mouth 2 (two) times daily between meals. What changed: when to take this   hydrOXYzine 10 MG tablet Commonly known as: ATARAX/VISTARIL Take 10 mg by mouth 2 (two) times daily.   levETIRAcetam 1000 MG tablet Commonly known as: Keppra Take 1 tablet (1,000 mg total) by mouth 2 (two) times daily.   pantoprazole 40 MG tablet Commonly known as: PROTONIX TAKE 1 TABLET(40 MG) BY MOUTH DAILY   sitaGLIPtin 50 MG tablet Commonly known as: Januvia Take 1 tablet (50 mg total) by mouth daily.   triamcinolone 0.025 % ointment Commonly known as: KENALOG Apply 1 application topically 2 (two) times daily. Please in thin layer gently (without rubbing) over eczematoid rash over the left arm   Vitamin D (Ergocalciferol) 1.25 MG (50000 UT) Caps capsule Commonly known as: DRISDOL TAKE 1  CAPSULE BY MOUTH 3 TIMES WEEKLY        ALLERGIES: Allergies  Allergen Reactions  . Thorazine [Chlorpromazine] Anaphylaxis  . Ribociclib Other (See Comments)    Exfoliative dermatitis     REVIEW OF SYSTEMS: A comprehensive ROS was conducted with the patient and is negative except as per HPI and below:  ROS    OBJECTIVE:   VITAL SIGNS: There were no vitals taken for this visit.   PHYSICAL EXAM:  General: Pt appears well and is in NAD  Hydration: Well-hydrated with moist mucous membranes and good skin turgor  HEENT: Head: Unremarkable with good dentition. Oropharynx clear without exudate.  Eyes: External eye exam normal without stare, lid lag or exophthalmos.  EOM intact.  PERRL.  Neck: General: Supple without adenopathy or carotid bruits. Thyroid: Thyroid size normal.  No goiter or nodules appreciated. No thyroid bruit.  Lungs: Clear with good BS bilat with no rales, rhonchi, or wheezes  Heart: RRR with normal S1 and S2 and no gallops; no murmurs; no rub  Abdomen: Normoactive bowel sounds, soft, nontender, without masses or organomegaly palpable  Extremities:  Lower extremities - No pretibial edema. No lesions.  Skin: Normal texture and temperature to palpation. No rash noted. No Acanthosis nigricans/skin tags. No lipohypertrophy.  Neuro: MS is good with appropriate affect, pt is alert and Ox3    DM foot exam:    DATA REVIEWED:  No results found for: HGBA1C Lab Results  Component Value Date   CREATININE 0.82 06/14/2019   No results found for: MICRALBCREAT  No results found for: CHOL, HDL, LDLCALC, LDLDIRECT, TRIG, CHOLHDL      ASSESSMENT / PLAN / RECOMMENDATIONS:   1) Type *** Diabetes Mellitus, ***controlled, With*** complications - Most recent A1c of *** %. Goal A1c < *** %.  ***  Plan: GENERAL:  ***  MEDICATIONS:  ***  EDUCATION / INSTRUCTIONS:  BG monitoring instructions: Patient is instructed to check her blood sugars *** times a day, ***.   Call [facility] if: BG persistently < 70 or > 300. . I reviewed the Rule of 15 for the treatment of hypoglycemia in detail with the patient. Literature supplied.   2) Diabetic complications:   Eye: Does *** have known diabetic retinopathy.   Neuro/ Feet: Does *** have known diabetic peripheral neuropathy.  Renal: Patient does *** have known baseline CKD. She is *** on an ACEI/ARB at present.Check urine albumin/creatinine ratio yearly starting at time of diagnosis. If albuminuria is positive, treatment is geared toward better glucose, blood pressure control and use of ACE inhibitors or ARBs. Monitor electrolytes and creatinine once to twice  yearly.   3) Lipids: Patient is *** on a statin.    4) Hypertension: ***  at goal of < 140/90 mmHg.       Signed electronically by: Mack Guise, MD  [facility]  [facility] [address] [place], [place] [address] Phone: [phone] FAX: [phone]   CC: Patient, No Pcp Per No address on file Phone: None  Fax: None    Return to Endocrinology clinic as below: Future Appointments  Date Time Provider [place]  06/25/2019  2:00 PM Shamleffer, Melanie Crazier, MD [facility] None  06/28/2019 10:00 AM [name], MD [facility]  06/28/2019  1:00 PM [facility] LAB 4 [facility] None  06/28/2019  1:15 PM [facility] [place] FLUSH [facility] None  06/28/2019  1:40 PM [name], MD [facility] None  06/28/2019  2:30 PM [facility] INFUSION [facility] None  07/05/2019 12:30 PM [facility] LAB 5 [facility] None  07/05/2019 12:45 PM [facility] [place] FLUSH [facility] None  07/05/2019  1:30 PM [facility] INFUSION [facility] None  07/10/2019  9:45 AM [facility] [place] FLUSH [facility] None  07/12/2019 11:15 AM [facility] LAB ONLY [facility] None  07/12/2019 11:30 AM [facility] [place] FLUSH [facility] None  07/12/2019 12:00 PM [name], MD [facility] None  07/12/2019  1:00 PM  [facility] INFUSION [facility] None  07/25/2019  1:45 PM Bruning, Ashlyn, PA-C [facility] None

## 2019-06-27 NOTE — Progress Notes (Signed)
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   INTERVAL HISTORY   Stacey Cox is here for follow-up of her metastatic hormone positive HER-2 negative breast cancer. The patient's last visit with [place] was on 06/07/2019. The pt reports that she is doing well overall.  The pt reports that she is very fatigued today. The swelling in her arm and tingling in her fingers and toes have remained the same.  She spoke with her PCP about her high blood sugar. She has been taking her Januvia as prescribed. She is also taking Dexamethasone 1 mg.  She missed her appointment with her endocrinologist because she was too fatigued to go. She is working with home care therapy. She gets meals delivered and sometimes cooks her own meals.  Of note since the patient's last visit, pt has had MRI brain w and wo contrast completed on 06/07/2019 with results revealing "1. Progression of disease disease as evidenced by new dural metastasis along the inferolateral left temporal lobe with mild adjacent brain edema, see image 13:43. 2. Positive response at the intervally treated diffuse dural metastatic disease elsewhere along the bilateral cerebral convexity. 3. Posterior fossa meningiomas are stable. 4. No primary parenchymal metastasis."  Lab results today (06/28/2019) of CBC w/diff and CMP is as follows: all values are WNL except for WBC at 16.2k, RDW at 18.4, neutro abs at 14.1, abs immature granulocytes at 0.12, glucose bld at 230, total protein at 6.4, Albumin at 3.4, ALT at 72, alkaline phosphatase at 297.  On review of systems, pt reports fatigue, moving her bowels well, arm swelling, tingling in fingers and toes, and denies mouth sores, belly  pain, and any other symptoms.   MEDICAL HISTORY:  Past Medical History:  Diagnosis Date   Cancer [facility])    Metastatic Breast Cancer    SURGICAL HISTORY: Past Surgical History:  Procedure Laterality Date   CHOLECYSTECTOMY     IR IMAGING GUIDED PORT INSERTION  07/14/2018   TONSILLECTOMY      SOCIAL HISTORY: Social History   Socioeconomic History   Marital status: Single    Spouse name: Not on file   Number of children: Not on file   Years of education: Not on file   Highest education level: Not on file  Occupational History   Not on file  Social Needs   Financial resource strain: Not on file   Food insecurity    Worry: Not on file    Inability: Not on file   Transportation needs    Medical: Not on file    Non-medical: Not on file  Tobacco Use   Smoking status: Current Every Day Smoker    Packs/day: 0.15    Types: Cigarettes    Last attempt to quit: 01/21/2017    Years since quitting: 2.4   Smokeless tobacco: Never Used  Substance and Sexual Activity   Alcohol use: No    Alcohol/week: 0.0 standard drinks   Drug use: No   Sexual activity: Never  Lifestyle   Physical activity    Days per week: Not on file    Minutes per session: Not on file   Stress: Not on file  Relationships   Social connections  on phone: Not on file  °  Gets together: Not on file  °  Attends religious service: Not on file  °  Active member of club or organization: Not on file  °  Attends meetings of clubs or organizations: Not on file  °  Relationship status: Not on file  °• Intimate partner violence  °  Fear of current or ex partner: No  °  Emotionally abused: No  °  Physically abused: No  °  Forced sexual activity: No  °Other Topics Concern  °• Not on file  °Social History Narrative  ° Lives alone. 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 Has phobia of the medical system.  °Cigarette smoker 1/2 PPD for about 40 yrs °Social alcohol use °No drugs °Professor at [employer]  °Single no  children ° °FAMILY HISTORY: °Family History  °Problem Relation Age of Onset  °• Cancer Mother   °     breast  °• Cancer Sister   °     breast  °Mother with h/o breast cancer in her 60's °Sister with breast cancer in her 50ys and later was diagnosed with multiple myeloma. °(Patient is not aware of any specific breast cancer mutations present) ° °ALLERGIES:  is allergic to thorazine [chlorpromazine] and ribociclib. ° °MEDICATIONS:  °Current Outpatient Medications  °Medication Sig Dispense Refill  °• aspirin EC 81 MG tablet Take 1 tablet (81 mg total) by mouth daily.    °• chlorhexidine (PERIDEX) 0.12 % solution Use as directed 15 mLs in the mouth or throat 2 (two) times daily as needed (mouth rinse after vomiting). Swish and spit 473 mL 1  °• dexamethasone (DECADRON) 1 MG tablet TAKE 1 TABLET(1 MG) BY MOUTH DAILY 30 tablet 2  °• escitalopram (LEXAPRO) 20 MG tablet TAKE 1 TABLET(20 MG) BY MOUTH DAILY 30 tablet 0  °• feeding supplement, ENSURE ENLIVE, (ENSURE ENLIVE) LIQD Take 237 mLs by mouth 2 (two) times daily between meals. (Patient taking differently: Take 237 mLs by mouth daily. ) 237 mL 12  °• hydrOXYzine (ATARAX/VISTARIL) 10 MG tablet Take 10 mg by mouth 2 (two) times daily.    °• levETIRAcetam (KEPPRA) 1000 MG tablet Take 1 tablet (1,000 mg total) by mouth 2 (two) times daily. 60 tablet 5  °• pantoprazole (PROTONIX) 40 MG tablet TAKE 1 TABLET(40 MG) BY MOUTH DAILY 30 tablet 1  °• sitaGLIPtin (JANUVIA) 50 MG tablet Take 1 tablet (50 mg total) by mouth daily. 30 tablet 0  °• triamcinolone (KENALOG) 0.025 % ointment Apply 1 application topically 2 (two) times daily. Please in thin layer gently (without rubbing) over eczematoid rash over the left arm 30 g 0  °• Vitamin D, Ergocalciferol, (DRISDOL) 1.25 MG (50000 UT) CAPS capsule TAKE 1 CAPSULE BY MOUTH 3 TIMES WEEKLY (Patient not taking: Reported on 06/14/2019) 24 capsule 0  ° °No current facility-administered medications for this visit.   ° °Facility-Administered  Medications Ordered in Other Visits  °Medication Dose Route Frequency Provider Last Rate Last Dose  °• denosumab (XGEVA) injection 120 mg  120 mg Subcutaneous Once Kale, Gautam Kishore, MD      °• fulvestrant (FASLODEX) injection 500 mg  500 mg Intramuscular Once Kale, Gautam Kishore, MD      ° ° °REVIEW OF SYSTEMS:   ° °A 10+ POINT REVIEW OF SYSTEMS WAS OBTAINED including neurology, dermatology, psychiatry, cardiac, respiratory, lymph, extremities, GI, GU, Musculoskeletal, constitutional, breasts, reproductive, HEENT.  All pertinent positives are noted in the HPI.  All others are negative.  ° °PHYSICAL   PHYSICAL EXAMINATION:  ECOG PERFORMANCE STATUS: 1 - Symptomatic but completely ambulatory  Vitals:   06/28/19 1347  BP: 104/84  Pulse: 66  Resp: 17  Temp: 98 F (36.7 C)  SpO2: 95%   Filed Weights   06/28/19 1347  Weight: 169 lb 3.2 oz (76.7 kg)   Body mass index is 28.16 kg/m.    GENERAL:alert, in no acute distress and comfortable SKIN: no acute rashes, no significant lesions, palpable lipoma on the upper left back over the scapula EYES: conjunctiva are pink and non-injected, sclera anicteric OROPHARYNX: MMM, no exudates, no oropharyngeal erythema or ulceration NECK: supple, no JVD LYMPH:  no palpable lymphadenopathy in the cervical, axillary or inguinal regions LUNGS: clear to auscultation b/l with normal respiratory effort HEART: regular rate & rhythm ABDOMEN:  normoactive bowel sounds , non tender, not distended. No palpable hepatosplenomegaly.  Extremity: no pedal edema, left arm edema PSYCH: alert & oriented x 3 with fluent speech NEURO: no focal motor/sensory deficits      LABORATORY DATA:  I have reviewed the data as listed  CBC Latest Ref Rng & Units 06/14/2019 06/07/2019 05/31/2019  WBC 4.0 - 10.5 K/uL 3.8(L) 6.3 11.2(H)  Hemoglobin 12.0 - 15.0 g/dL 11.6(L) 12.0 13.1  Hematocrit 36.0 - 46.0 % 36.0 36.5 40.3  Platelets 150 - 400 K/uL 153 200 134(L)   CBC    Component Value  Date/Time   WBC 3.8 (L) 06/14/2019 1225   RBC 3.83 (L) 06/14/2019 1225   HGB 11.6 (L) 06/14/2019 1225   HGB 12.7 04/12/2019 0901   HGB 13.9 11/24/2017 1340   HCT 36.0 06/14/2019 1225   HCT 42.0 11/24/2017 1340   PLT 153 06/14/2019 1225   PLT 198 04/12/2019 0901   PLT 212 11/24/2017 1340   MCV 94.0 06/14/2019 1225   MCV 97.0 11/24/2017 1340   MCH 30.3 06/14/2019 1225   MCHC 32.2 06/14/2019 1225   RDW 17.5 (H) 06/14/2019 1225   RDW 14.1 11/24/2017 1340   LYMPHSABS 0.7 06/14/2019 1225   LYMPHSABS 1.4 11/24/2017 1340   MONOABS 0.3 06/14/2019 1225   MONOABS 0.3 11/24/2017 1340   EOSABS 0.1 06/14/2019 1225   EOSABS 0.1 11/24/2017 1340   BASOSABS 0.0 06/14/2019 1225   BASOSABS 0.1 11/24/2017 1340    . CMP Latest Ref Rng & Units 06/14/2019 06/07/2019 05/31/2019  Glucose 70 - 99 mg/dL 395(H) 355(H) 373(H)  BUN 8 - 23 mg/dL _0 Creatinine 0.44 - 1.00 mg/dL 0.82 0.80 0.87  Sodium 135 - 145 mmol/L 136 136 137  Potassium 3.5 - 5.1 mmol/L 3.7 3.5 3.8  Chloride 98 - 111 mmol/L 99 98 100  CO2 22 - 32 mmol/L _1 Calcium 8.9 - 10.3 mg/dL 9.4 9.4 8.9  Total Protein 6.5 - 8.1 g/dL 6.2(L) 6.4(L) 6.4(L)  Total Bilirubin 0.3 - 1.2 mg/dL 0.5 0.5 0.7  Alkaline Phos 38 - 126 U/L 250(H) 258(H) 341(H)  AST 15 - 41 U/L 50(H) 50(H) 36  ALT 0 - 44 U/L 84(H) 76(H) 70(H)   07/18/18 Pathology:          RADIOGRAPHIC STUDIES: I have personally reviewed the radiological images as listed and agreed with the findings in the report. Mr Jeri Cos Wo Contrast  Result Date: 06/08/2019 CLINICAL DATA:  Breast cancer with dural metastases. Most recent stereotactic radiotherapy 02/20/2019 EXAM: MRI HEAD WITHOUT AND WITH CONTRAST TECHNIQUE: Multiplanar, multiecho pulse sequences of the brain and surrounding structures were obtained without and with intravenous contrast.  CONTRAST:  43m MULTIHANCE GADOBENATE DIMEGLUMINE 529 MG/ML IV SOLN COMPARISON:  02/20/2019 FINDINGS: BRAIN New Lesions: 22 x 26 mm  enhancing lesion located along the low left dura overlying the transverse sigmoid junction with mild surrounding edema, seen on 13:43. Larger lesions: None. Stable or Smaller lesions: Malignant dural thickening along the left more than right cerebral convexities with intermittent nodularity contacting the brain surface. These areas are diffusely improved with index measurements below: 1. A nodule along the high a left frontal convexity has decreased from 26 x 14 mm to 16 x 9 mm on 13:124 2. A nodule along the high right frontal convexity previously measured 15 x 10 mm and is now only wispy enhancement extending into the adjacent sulcus on 13:124. 3. Dominant nodule along the greater wing left sphenoid, at the upper sphenoid parietal sinus, previously 35 x 17 mm and currently 26 x 7 mm on 13:72. Other Brain findings: Significantly improved brain edema. Marked bilateral inferior cerebellar atrophy. No acute hemorrhage, hydrocephalus, collection. Stable meningioma in the low right posterior fossa just posterior to the jugular foramen, 14 mm in diameter. This has been stable since 2018. Also stable is a no other presumed meningioma at the right prepontine cistern measuring 7 mm. Vascular: Major flow voids and vascular enhancements are preserved, including the left transverse sigmoid sinus. Skull and upper cervical spine: Known metastasis in the C4 posterior body. Patchy sclerotic metastases in the calvarium that appears similar. Sinuses/Orbits: Negative IMPRESSION: 1. Progression of disease disease as evidenced by new dural metastasis along the inferolateral left temporal lobe with mild adjacent brain edema, see image 13:43. 2. Positive response at the intervally treated diffuse dural metastatic disease elsewhere along the bilateral cerebral convexity. 3. Posterior fossa meningiomas are stable. 4. No primary parenchymal metastasis. Electronically Signed   By: JMonte FantasiaM.D.   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 Multifocal tumor in the left breast with biopsy-proven left axillary metastases.   Noted to have extensive bone metastases and pulmonary metastases. Patient was noted to have calvarial metastases but no overt parenchymal metastasis 09/14/17 CT Chest/ad/pelvis Results discussed in details - good response.  02/22/18 CT revealed  Newly apparent 2.1 by 2.0 cm rim enhancing lesion in the right hepatic lobe suspicious for a metastatic lesion. This was not apparent on prior exams although the prior exams were all noncontrast and thus the lesion may have been present but with reduced conspicuity. There is also a small enhancing lesion further posteriorly in the right hepatic lobe which is technically nonspecific and could be a small hemangioma or a small metastatic lesion. Stable distribution and appearance of prior sclerotic osseous metastatic disease without bony progression. 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   07/06/18 CT C/A/P which revealed New pattern of heterogeneous enhancement and enhancing ill-defined lesions in the central LEFT and RIGHT hepatic lobe at site prior enhancing lesion is highly concerning for progression of of infiltrative malignancy in the liver occupying a large portion of the RIGHT hepatic lobe and central LEFT hepatic lobe. 2. Small amount of free fluid in the abdomen pelvis. 3. No metastatic adenopathy.4. No evidence pulmonary metastasis or lymphadenopathy. 5. Stable dense sclerotic skeletal metastasis   07/11/18 ECHO revealed LV EF of 55%-60%   07/18/18 Liver  right lesion consi with Ki67 at 40%, ER/PR +ve  ° °07/28/18 MRI Abdomen MRCP revealed Extensive hepatic metastatic disease, possibly progressive from recent abdominal CT. 2. No evidence of  intrahepatic or extrahepatic biliary dilatation. Elevated liver function studies may be secondary to intrahepatic cholestasis. 3. Increased ascites without peritoneal nodularity or suspicious enhancement. 4. Grossly stable widespread osseous metastatic disease.  ° °S/p 6 cycles of AC completed on 11/01/18 ° °03/20/19 CT C/A/P revealed "Although the dominant liver lesion appears improved in the interval, there appears to be 2 new liver metastases on today's exam, concerning for disease progression. 2. Similar appearance of the widespread sclerotic bone metastases. 3. Interval decrease in ascites. 4. Aortic Atherosclerois." ° °Pt's planned third weekly infusion of Taxol was cancelled due to dental abscess s/p antibiotic course. Then cancelled again due to thrombocytopenia thought to be due to antibiotic course. ° °#2 Bone metastases due to breast cancer- on Xgeva. Much improved back pain. ° °# 3 Neutropenia Related to her Ribociclib - resolved. Patient is currently on Verzenio and has not developed any neutropenia. This is being monitored. ° °#4 Increased LFTs due to new liver metastases ° °#5 Cancer related pain - controlled - status post palliative RT to spine - no currently needing any pain medications. ° °#6 s/p Grade 1-2 Exfoliative dermatitis - likely from Ribociclib. No other new medication. Now resolved. Monitoring on increasing doses of Verzenio. No issues with recurrent rash on Verzenio 150mg po BID ° °#7 Brain metastasis ° °07/20/18 CT Head revealed 1.8 x 2.4 x 3.9 cm LEFT frontal cortical based mass with possible dural component highly concerning for metastatic disease, less likely abscess or cerebritis. Severe vasogenic edema with 5 mm LEFT to RIGHT subfalcine herniation. No ventricular entrapment. 2. 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 3. Multiple calvarial metastasis. 4. Cerebellar atrophy.   ° °07/21/18 MRI Brain revealed  Irregular plaque-like mass,  likely metastasis, measuring up to 5.1 cm, appears to originate from the left anterolateral frontal dura with invasion of the underlying frontal lobe of the brain. Mass effect from brain edema and the lesion partially effaces the frontal horn of left lateral ventricle and results in 7 mm left-to-right midline shift of anterior septum pellucidum. 2. Diffuse dural thickening over the left cerebral convexity may represent edema associated with the tumor or invasive neoplasm. 3. 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  ° °07/28/18 MRI Brain revealed Extensive nodular dural enhancement left frontal lobe with associated enhancing mass growing in the left frontal lobe is unchanged in size and compatible with metastatic disease. This is associated with metastatic disease to the left frontal bone. Extensive edema in the left frontal lobe has progressed in the interval. Blastic metastatic disease throughout the calvarium. Metastatic disease in the cervical spine. Enhancing mass along the floor of the posterior fossa on the right is stable and most consistent with meningioma. 6 mm enhancing mass in the prepontine cistern on the right also unchanged and compatible with meningioma.  ° °s/p SRS radiation therapy ° °10/23/18 MRI Brain revealed SRS protocol demonstrating treatment response: Regression of LEFT frontal dural metastasis, decreased vasogenic edema and parenchymal invasion. Similar calvarial metastasis. 2. Stable appearance of 2 small posterior fossa meningiomas. 3. No acute intracranial process.  ° °02/02/19 MRI Brain revealed "Marked progression of dural disease left greater than right as described above. Pronounced brain edema, left more than right with left-to-right shift of 1 cm. 2.   I do not identify any brain parenchymal metastases. 3. Small stable incidental meningiomas in the posterior fossa as above." ° °Pt is s/p SRS for dural metastases. ° °06/07/2019 MRI brain w and  wo contrast revealed "1. Progression of disease disease as evidenced by new dural metastasis along the inferolateral left temporal lobe with mild adjacent brain edema, see image 13:43. 2. Positive response at the intervally treated diffuse dural metastatic disease elsewhere along the bilateral cerebral convexity. 3. Posterior fossa meningiomas are stable. 4. No primary parenchymal metastasis." ° °PLAN:  °-Continue follow up with Dr. Vaslow in Neuro-oncology on 02/06/2019 and Dr. Manning in Rad Onc  °-Continue with Xgeva every 4 weeks  ° °#8 Vitamin D deficiency °Vit D levels improved to 44.8  On 12/01/2018 °-continue Vit D replacement. ° °#9 h/o Left upper extremity swelling- US neg for DVT - now swelling resolved. ° ° °PLAN:  °-Discussed pt labwork today, 06/28/2019; blood counts are stable, liver function has improved, blood sugars have decreased °-The pt has no prohibitive toxicities from continuing C4D1 Paclitaxel with OnPro support at this time. °-Will continue dexamethazone until she is done with RT, pt says she finished today, 06/28/2019 °-Will restart Xgeva at the time, dentist has c leared her for this. °-Plan to repeat scans after C4 °-Will see the pt back in 3 weeks ° ° °-F/u as per scheduled appointments for the next 3 weeks °-CT chest/abd/pelvis and Bone scan in 10 days ° ° °All of the patients questions were answered with apparent satisfaction. The patient knows to call the clinic with any problems, questions or concerns. ° °The total time spent in the appt was 25 minutes and more than 50% was on counseling and direct patient cares. ° °Gautam Kale MD MS AAHIVMS [facility] °Hematology/Oncology Physician °[facility] ° °(Office):       [phone] °(Work cell):  [phone] °(Fax):           [phone] ° °I, Emily Tufford, am acting as a scribe for Dr. Kale ° °.I have reviewed the above documentation for accuracy and completeness, and I agree with the above. °.Gautam Kishore Kale MD °  °

## 2019-06-28 ENCOUNTER — Inpatient Hospital Stay: Payer: BC Managed Care – PPO

## 2019-06-28 ENCOUNTER — Ambulatory Visit (INDEPENDENT_AMBULATORY_CARE_PROVIDER_SITE_OTHER): Payer: BC Managed Care – PPO | Admitting: Family Medicine

## 2019-06-28 ENCOUNTER — Inpatient Hospital Stay (HOSPITAL_BASED_OUTPATIENT_CLINIC_OR_DEPARTMENT_OTHER): Payer: BC Managed Care – PPO | Admitting: Hematology

## 2019-06-28 ENCOUNTER — Inpatient Hospital Stay: Payer: BC Managed Care – PPO | Attending: Hematology

## 2019-06-28 ENCOUNTER — Telehealth: Payer: Self-pay | Admitting: *Deleted

## 2019-06-28 ENCOUNTER — Other Ambulatory Visit: Payer: Self-pay

## 2019-06-28 ENCOUNTER — Telehealth: Payer: Self-pay | Admitting: Hematology

## 2019-06-28 ENCOUNTER — Encounter: Payer: Self-pay | Admitting: Family Medicine

## 2019-06-28 VITALS — BP 104/84 | HR 66 | Temp 98.0°F | Resp 17 | Ht 65.0 in | Wt 169.2 lb

## 2019-06-28 DIAGNOSIS — C7931 Secondary malignant neoplasm of brain: Secondary | ICD-10-CM

## 2019-06-28 DIAGNOSIS — Z7189 Other specified counseling: Secondary | ICD-10-CM

## 2019-06-28 DIAGNOSIS — C50919 Malignant neoplasm of unspecified site of unspecified female breast: Secondary | ICD-10-CM

## 2019-06-28 DIAGNOSIS — C7800 Secondary malignant neoplasm of unspecified lung: Secondary | ICD-10-CM | POA: Insufficient documentation

## 2019-06-28 DIAGNOSIS — Z5111 Encounter for antineoplastic chemotherapy: Secondary | ICD-10-CM

## 2019-06-28 DIAGNOSIS — Z95828 Presence of other vascular implants and grafts: Secondary | ICD-10-CM

## 2019-06-28 DIAGNOSIS — G893 Neoplasm related pain (acute) (chronic): Secondary | ICD-10-CM | POA: Insufficient documentation

## 2019-06-28 DIAGNOSIS — C7951 Secondary malignant neoplasm of bone: Secondary | ICD-10-CM | POA: Diagnosis not present

## 2019-06-28 DIAGNOSIS — C50912 Malignant neoplasm of unspecified site of left female breast: Secondary | ICD-10-CM | POA: Diagnosis not present

## 2019-06-28 DIAGNOSIS — C787 Secondary malignant neoplasm of liver and intrahepatic bile duct: Secondary | ICD-10-CM | POA: Insufficient documentation

## 2019-06-28 DIAGNOSIS — E559 Vitamin D deficiency, unspecified: Secondary | ICD-10-CM | POA: Insufficient documentation

## 2019-06-28 DIAGNOSIS — R569 Unspecified convulsions: Secondary | ICD-10-CM

## 2019-06-28 DIAGNOSIS — Z170 Estrogen receptor positive status [ER+]: Secondary | ICD-10-CM | POA: Diagnosis not present

## 2019-06-28 DIAGNOSIS — R5383 Other fatigue: Secondary | ICD-10-CM | POA: Diagnosis not present

## 2019-06-28 DIAGNOSIS — R739 Hyperglycemia, unspecified: Secondary | ICD-10-CM | POA: Diagnosis not present

## 2019-06-28 DIAGNOSIS — C50812 Malignant neoplasm of overlapping sites of left female breast: Secondary | ICD-10-CM | POA: Insufficient documentation

## 2019-06-28 DIAGNOSIS — Z7689 Persons encountering health services in other specified circumstances: Secondary | ICD-10-CM

## 2019-06-28 LAB — CMP (CANCER CENTER ONLY)
ALT: 72 U/L — ABNORMAL HIGH (ref 0–44)
AST: 36 U/L (ref 15–41)
Albumin: 3.4 g/dL — ABNORMAL LOW (ref 3.5–5.0)
Alkaline Phosphatase: 297 U/L — ABNORMAL HIGH (ref 38–126)
Anion gap: 13 (ref 5–15)
BUN: 15 mg/dL (ref 8–23)
CO2: 24 mmol/L (ref 22–32)
Calcium: 9.6 mg/dL (ref 8.9–10.3)
Chloride: 100 mmol/L (ref 98–111)
Creatinine: 0.75 mg/dL (ref 0.44–1.00)
GFR, Est AFR Am: >60 mL/min (ref >60)
GFR, Estimated: >60 mL/min (ref >60)
Glucose, Bld: 230 mg/dL — ABNORMAL HIGH (ref 70–99)
Potassium: 3.8 mmol/L (ref 3.5–5.1)
Sodium: 137 mmol/L (ref 135–145)
Total Bilirubin: 0.6 mg/dL (ref 0.3–1.2)
Total Protein: 6.4 g/dL — ABNORMAL LOW (ref 6.5–8.1)

## 2019-06-28 LAB — CBC WITH DIFFERENTIAL/PLATELET
Abs Immature Granulocytes: 0.12 10*3/uL — ABNORMAL HIGH (ref 0.00–0.07)
Basophils Absolute: 0.1 10*3/uL (ref 0.0–0.1)
Basophils Relative: 0 %
Eosinophils Absolute: 0.1 10*3/uL (ref 0.0–0.5)
Eosinophils Relative: 0 %
HCT: 39.9 % (ref 36.0–46.0)
Hemoglobin: 12.9 g/dL (ref 12.0–15.0)
Immature Granulocytes: 1 %
Lymphocytes Relative: 7 %
Lymphs Abs: 1.2 10*3/uL (ref 0.7–4.0)
MCH: 30.5 pg (ref 26.0–34.0)
MCHC: 32.3 g/dL (ref 30.0–36.0)
MCV: 94.3 fL (ref 80.0–100.0)
Monocytes Absolute: 0.6 10*3/uL (ref 0.1–1.0)
Monocytes Relative: 4 %
Neutro Abs: 14.1 10*3/uL — ABNORMAL HIGH (ref 1.7–7.7)
Neutrophils Relative %: 88 %
Platelets: 155 10*3/uL (ref 150–400)
RBC: 4.23 MIL/uL (ref 3.87–5.11)
RDW: 18.4 % — ABNORMAL HIGH (ref 11.5–15.5)
WBC: 16.2 10*3/uL — ABNORMAL HIGH (ref 4.0–10.5)
nRBC: 0.1 % (ref 0.0–0.2)

## 2019-06-28 MED ORDER — SODIUM CHLORIDE 0.9 % IV SOLN
80.0000 mg/m2 | Freq: Once | INTRAVENOUS | Status: AC
  Administered 2019-06-28: 150 mg via INTRAVENOUS
  Filled 2019-06-28: qty 25

## 2019-06-28 MED ORDER — SODIUM CHLORIDE 0.9% FLUSH
10.0000 mL | INTRAVENOUS | Status: DC | PRN
  Administered 2019-06-28: 10 mL
  Filled 2019-06-28: qty 10

## 2019-06-28 MED ORDER — SODIUM CHLORIDE 0.9 % IV SOLN
Freq: Once | INTRAVENOUS | Status: AC
  Administered 2019-06-28: 15:00:00 via INTRAVENOUS
  Filled 2019-06-28: qty 6

## 2019-06-28 MED ORDER — DIPHENHYDRAMINE HCL 50 MG/ML IJ SOLN
INTRAMUSCULAR | Status: AC
  Filled 2019-06-28: qty 1

## 2019-06-28 MED ORDER — SODIUM CHLORIDE 0.9 % IV SOLN
Freq: Once | INTRAVENOUS | Status: AC
  Administered 2019-06-28: 15:00:00 via INTRAVENOUS
  Filled 2019-06-28: qty 250

## 2019-06-28 MED ORDER — DIPHENHYDRAMINE HCL 50 MG/ML IJ SOLN
50.0000 mg | Freq: Once | INTRAMUSCULAR | Status: AC
  Administered 2019-06-28: 50 mg via INTRAVENOUS

## 2019-06-28 MED ORDER — SODIUM CHLORIDE 0.9% FLUSH
10.0000 mL | Freq: Once | INTRAVENOUS | Status: AC
  Administered 2019-06-28: 10 mL
  Filled 2019-06-28: qty 10

## 2019-06-28 MED ORDER — DENOSUMAB 120 MG/1.7ML ~~LOC~~ SOLN
SUBCUTANEOUS | Status: AC
  Filled 2019-06-28: qty 1.7

## 2019-06-28 MED ORDER — FAMOTIDINE IN NACL 20-0.9 MG/50ML-% IV SOLN
INTRAVENOUS | Status: AC
  Filled 2019-06-28: qty 50

## 2019-06-28 MED ORDER — FAMOTIDINE IN NACL 20-0.9 MG/50ML-% IV SOLN
20.0000 mg | Freq: Once | INTRAVENOUS | Status: AC
  Administered 2019-06-28: 15:00:00 20 mg via INTRAVENOUS

## 2019-06-28 MED ORDER — DENOSUMAB 120 MG/1.7ML ~~LOC~~ SOLN
120.0000 mg | Freq: Once | SUBCUTANEOUS | Status: AC
  Administered 2019-06-28: 120 mg via SUBCUTANEOUS

## 2019-06-28 MED ORDER — HEPARIN SOD (PORK) LOCK FLUSH 100 UNIT/ML IV SOLN
500.0000 [IU] | Freq: Once | INTRAVENOUS | Status: AC | PRN
  Administered 2019-06-28: 18:00:00 500 [IU]
  Filled 2019-06-28: qty 5

## 2019-06-28 NOTE — Telephone Encounter (Signed)
Received call from Dr. Asiyah Pineau Cockayne, periodontist. She states patient had last follow up appt yesterday. Gums healing well. Dr. Megen Madewell Cockayne states dental status not a barrier for patient to resume Xgeva. Dr.Kale informed.

## 2019-06-28 NOTE — Patient Instructions (Signed)
[facility] Discharge Instructions for Patients Receiving Chemotherapy  Today you received the following chemotherapy agents: Taxol  To help prevent nausea and vomiting after your treatment, we encourage you to take your nausea medication as directed.    If you develop nausea and vomiting that is not controlled by your nausea medication, call the clinic.   BELOW ARE SYMPTOMS THAT SHOULD BE REPORTED IMMEDIATELY:  *FEVER GREATER THAN 100.5 F  *CHILLS WITH OR WITHOUT FEVER  NAUSEA AND VOMITING THAT IS NOT CONTROLLED WITH YOUR NAUSEA MEDICATION  *UNUSUAL SHORTNESS OF BREATH  *UNUSUAL BRUISING OR BLEEDING  TENDERNESS IN MOUTH AND THROAT WITH OR WITHOUT PRESENCE OF ULCERS  *URINARY PROBLEMS  *BOWEL PROBLEMS  UNUSUAL RASH Items with * indicate a potential emergency and should be followed up as soon as possible.  Feel free to call the clinic should you have any questions or concerns. The clinic phone number is [phone].  Please show the [place] at check-in to the Emergency Department and triage nurse.  Coronavirus (COVID-19) Are you at risk?  Are you at risk for the Coronavirus (COVID-19)?  To be considered HIGH RISK for Coronavirus (COVID-19), you have to meet the following criteria:  . Traveled to [place], [place], [place], [place] or [place]; or in the [place] to [place], [place], [place], or [place]; and have fever, cough, and shortness of breath within the last 2 weeks of travel OR . Been in close contact with a person diagnosed with COVID-19 within the last 2 weeks and have fever, cough, and shortness of breath . IF YOU DO NOT MEET THESE CRITERIA, YOU ARE CONSIDERED LOW RISK FOR COVID-19.  What to do if you are HIGH RISK for COVID-19?  [name] If you are having a medical emergency, call 911. . Seek medical care right away. Before you go to a doctor's office, urgent care or emergency department, call ahead and tell them about your recent  travel, contact with someone diagnosed with COVID-19, and your symptoms. You should receive instructions from your physician's office regarding next steps of care.  . When you arrive at healthcare provider, tell the healthcare staff immediately you have returned from visiting [place], [place], [place], [place] or [place]; or traveled in the [place] to [place], [place], [place], or [place]; in the last two weeks or you have been in close contact with a person diagnosed with COVID-19 in the last 2 weeks.   . Tell the health care staff about your symptoms: fever, cough and shortness of breath. . After you have been seen by a medical provider, you will be either: o Tested for (COVID-19) and discharged home on quarantine except to seek medical care if symptoms worsen, and asked to  - Stay home and avoid contact with others until you get your results (4-5 days)  - Avoid travel on public transportation if possible (such as bus, train, or airplane) or o Sent to the Emergency Department by EMS for evaluation, COVID-19 testing, and possible admission depending on your condition and test results.  What to do if you are LOW RISK for COVID-19?  Reduce your risk of any infection by using the same precautions used for avoiding the common cold or flu:  [name] Wash your hands often with soap and warm water for at least 20 seconds.  If soap and water are not readily available, use an alcohol-based hand sanitizer with at least 60% alcohol.  . If coughing or  sneezing, cover your mouth and nose by coughing or sneezing into the elbow areas of your shirt or coat, into a tissue or into your sleeve (not your hands). . Avoid shaking hands with others and consider head nods or verbal greetings only. . Avoid touching your eyes, nose, or mouth with unwashed hands.  . Avoid close contact with people who are sick. . Avoid places or events with large numbers of people in one location, like concerts or sporting  events. . Carefully consider travel plans you have or are making. . If you are planning any travel outside or inside the [place], visit the CDC's Travelers' Health webpage for the latest health notices. . If you have some symptoms but not all symptoms, continue to monitor at home and seek medical attention if your symptoms worsen. . If you are having a medical emergency, call 911.   [place] / e-Visit: [email or web address]         [facility]: [facility]: [phone]                   [facility]: [phone]

## 2019-06-28 NOTE — Telephone Encounter (Signed)
Per 8/6 los -F/u as per scheduled appointments for the next 3 weeks -CT chest/abd/pelvis and Bone scan in 10 days

## 2019-06-28 NOTE — Progress Notes (Signed)
Virtual Visit via Video Note  I connected with Stacey Cox on 06/28/19 at 10:00 AM EDT by a video enabled telemedicine application and verified that I am speaking with the correct person using two identifiers.  Location patient: home Location provider:work or home office Persons participating in the virtual visit: patient, provider  I discussed the limitations of evaluation and management by telemedicine and the availability of in person appointments. The patient expressed understanding and agreed to proceed.   HPI: Pt is a 63 yo female with pmh sig for metastatic breast cancer  Breast cancer -dx'd [birth year] her birthday, 01/20/2017 -ER/PR +, HER 2 neg -with mets to brain, bone, and liver -on chemo -drinking ensure.  Has no appetite -seen by Nutrition in the past -States her fsbs has gone up to 300 within the last month -states has no energy, tired all the time.  Sleeping during the day and up at night. -has been in counseling on and off since age 7. 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 Rulon Eisenmenger, but was so tired she could not make it. -on decadron 1 mg daily.  Discussed with specialist could be stopped if needed. -taking Januvia 50 mg daily.  Unsure if helping bs  Allergies:  Thorazine- anaphylaxis Kisqali- the Ribociclib component thought to have caused skin shedding   Social hx: Pt moved from [place], [place].  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  ROS: See pertinent positives and negatives per HPI.  Past Medical History:  Diagnosis Date  . Cancer [facility])    Metastatic Breast Cancer    Past Surgical History:  Procedure Laterality Date  . CHOLECYSTECTOMY    . IR IMAGING GUIDED PORT INSERTION  07/14/2018  . TONSILLECTOMY      Family History  Problem Relation Age of Onset  . Cancer Mother        breast  . Cancer Sister        breast     Current  Outpatient Medications:  .  aspirin EC 81 MG tablet, Take 1 tablet (81 mg total) by mouth daily., Disp: , Rfl:  .  chlorhexidine (PERIDEX) 0.12 % solution, Use as directed 15 mLs in the mouth or throat 2 (two) times daily as needed (mouth rinse after vomiting). Swish and spit, Disp: 473 mL, Rfl: 1 .  dexamethasone (DECADRON) 1 MG tablet, TAKE 1 TABLET(1 MG) BY MOUTH DAILY, Disp: 30 tablet, Rfl: 2 .  escitalopram (LEXAPRO) 20 MG tablet, TAKE 1 TABLET(20 MG) BY MOUTH DAILY, Disp: 30 tablet, Rfl: 0 .  feeding supplement, ENSURE ENLIVE, (ENSURE ENLIVE) LIQD, Take 237 mLs by mouth 2 (two) times daily between meals. (Patient taking differently: Take 237 mLs by mouth daily. ), Disp: 237 mL, Rfl: 12 .  hydrOXYzine (ATARAX/VISTARIL) 10 MG tablet, Take 10 mg by mouth 2 (two) times daily., Disp: , Rfl:  .  levETIRAcetam (KEPPRA) 1000 MG tablet, Take 1 tablet (1,000 mg total) by mouth 2 (two) times daily., Disp: 60 tablet, Rfl: 5 .  pantoprazole (PROTONIX) 40 MG tablet, TAKE 1 TABLET(40 MG) BY MOUTH DAILY, Disp: 30 tablet, Rfl: 1 .  sitaGLIPtin (JANUVIA) 50 MG tablet, Take 1 tablet (50 mg total) by mouth daily., Disp: 30 tablet, Rfl: 0 .  triamcinolone (KENALOG) 0.025 % ointment, Apply 1 application topically 2 (two) times daily. Please in thin layer gently (without rubbing) over eczematoid rash  over the left arm, Disp: 30 g, Rfl: 0 .  Vitamin D, Ergocalciferol, (DRISDOL) 1.25 MG (50000 UT) CAPS capsule, TAKE 1 CAPSULE BY MOUTH 3 TIMES WEEKLY (Patient not taking: Reported on 06/14/2019), Disp: 24 capsule, Rfl: 0 No current facility-administered medications for this visit.   Facility-Administered Medications Ordered in Other Visits:  .  denosumab (XGEVA) injection 120 mg, 120 mg, Subcutaneous, Once, Stacey Cox, Stacey Spring, MD .  fulvestrant (FASLODEX) injection 500 mg, 500 mg, Intramuscular, Once, Stacey Cox, Stacey Spring, MD  EXAMTonette Cox per patient if applicable: RR between 69-45 bpm  GENERAL: alert, oriented,  appears well and in no acute distress  HEENT: atraumatic, conjunctiva clear, no obvious abnormalities on inspection of external nose and ears  NECK: normal movements of the head and neck  LUNGS: on inspection no signs of respiratory distress, breathing rate appears normal, no obvious gross SOB, gasping or wheezing  CV: no obvious cyanosis  MS: moves all visible extremities without noticeable abnormality  PSYCH/NEURO: pleasant and cooperative, no obvious depression or anxiety, speech and thought processing grossly intact  ASSESSMENT AND PLAN:  Discussed the following assessment and plan:  Seizures (Stacey Cox)  -stable -continue keppra 1000 mg BID  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 -CT abd/pelvis 03/20/2019 without pancreatic masses -pt given tips to help with fsbs checks -pt will try to obtain fsbs readings this wk.  Based on readings will adjust meds prn -continue januvia 50 mg -pt encouraged to try to eat small meals throughout the day or snacks.  Also discussed supplemental drinks. -pt encouraged to contact Endo to reschedule appt. -f/u in 1-2 wks  Encounter to establish care  -We reviewed the PMH, PSH, FH, SH, Meds and Allergies. -We provided refills for any medications we will prescribe as needed. -We addressed current concerns per orders and patient instructions. -We have asked for records for pertinent exams, studies, vaccines and notes from previous providers. -We have advised patient to follow up per instructions below.  Pt to f/u in 1-2 wks   I discussed the assessment and treatment plan with the  patient. The patient was provided an opportunity to ask questions and all were answered. The patient agreed with the plan and demonstrated an understanding of the instructions.   The patient was advised to call back or seek an in-person evaluation if the symptoms worsen or if the condition fails to improve as anticipated.  I provided 25 minutes of non-face-to-face time during this encounter.   [name], MD

## 2019-06-29 ENCOUNTER — Telehealth: Payer: Self-pay | Admitting: *Deleted

## 2019-06-29 NOTE — Telephone Encounter (Signed)
Contacted by Shauna Hugh, nurse with [facility]. She saw patient today. Patient is keeping up with medications and is checking her blood glucose, but not regularly.  States patient is very fatigued, has not been able to participate in home PT recently. Spends much of her time in bed. Patient has a friend going to store for her and picking up Boost this afternoon (per nurse, patient primarily consumes Boost for all nutrition). Diane recommends continuing home visits 2 x week to check on patient meds, blood glucose monitoring, and assess nutritional status. She will check with insurance and fax request for Dr. Grier Mitts signature if approved. Diane asked if any provider had considered a referral to Palliative care to add another layer of support.  Advised her that Dr. Irene Limbo will be informed of her suggestion.

## 2019-06-30 ENCOUNTER — Other Ambulatory Visit: Payer: Self-pay | Admitting: Hematology

## 2019-07-03 NOTE — Telephone Encounter (Signed)
Received orders by fax from [facility] -  as outlined in previous message. Dr. Irene Limbo signed orders. Faxed to [facility] at [phone]. Fax confirmation received.

## 2019-07-04 ENCOUNTER — Telehealth: Payer: Self-pay | Admitting: *Deleted

## 2019-07-04 ENCOUNTER — Telehealth: Payer: Self-pay | Admitting: Hematology

## 2019-07-04 NOTE — Telephone Encounter (Addendum)
Patient called to report 2 falls - once on Friday night getting into bed (stating generalized weakness) and again on Saturday walking up the walk after going to [employer] (stating generalized weakness). She is experiencing numbness and tingling in her lower legs and feet. She stated she is not participating in home health PT because she is too fatigued. Informed her that Dr. Irene Limbo will be given this information. Per Dr. Irene Limbo - contact patient with following information: He will cancel the next two doses of taxol to allow her to rest, focus on diet and PT and regain her strength. She can return next week on 8/20 for a lab and visit with him. Will try to schedule Faslodex and Xgeva on same day as MD appt to minimize outings.  Attempted to contact patient - left voice mail to let her know not to come tomorrow and with information and change in treatment. Advised her that would call her tomorrow.

## 2019-07-04 NOTE — Telephone Encounter (Signed)
R/s appt per 8/12 sch message unable to reach pt . Left message with appt date and time

## 2019-07-05 ENCOUNTER — Other Ambulatory Visit: Payer: BC Managed Care – PPO

## 2019-07-05 ENCOUNTER — Ambulatory Visit: Payer: BC Managed Care – PPO

## 2019-07-05 NOTE — Telephone Encounter (Signed)
Contacted patient AM 8/13 to confirm schedule changes as noted in previous note 8/12. Patient verbalized understanding. She asked that Dr. Irene Limbo be reminded about tingling in legs.Advised her that he would be informed.

## 2019-07-09 ENCOUNTER — Telehealth: Payer: Self-pay | Admitting: *Deleted

## 2019-07-09 NOTE — Telephone Encounter (Signed)
Per Dr. Irene Limbo, contact patient with following: will see patient on Thursday and evaluate leg numbness and tingling. He may change a medication, but needs to evaluate first. In the meantime, he recommends support stockings/socks. Patient states she will try support socks, but that she isn't sleeping with her legs feeling like this. She looks forward to seeing Dr. Irene Limbo on Thursday.

## 2019-07-09 NOTE — Progress Notes (Signed)
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   INTERVAL HISTORY   Stacey Cox is here for follow-up of her metastatic hormone positive HER-2 negative breast cancer. The patient's last visit with [place] was on 06/28/2019. The pt reports that she is doing well overall.  The pt reports fatigue and is sleeping the majority of the day. She has been experiencing stinging in her fingers and toes, which is worse in her toes. Home health care has been checking her blood sugars. Her read yesterday was 225. Pt has since stopped seeing her PT and OT, is currently looking for another. Pt has been eating one meal a day. Circadian rhythm has been off and she has been having issues balancing. Has not had any new falls since last visit.   Lab results today (07/12/2019) of CBC w/diff and CMP is as follows: all values are WNL except for RDW at 16.6, Glucose at 217, Total Protein at 6.4, Albumin at 3.3, Ast at 67, ALT at 97, Alkaline Phosphatase at 311.  07/12/2019 Cancer antigen 27.29 continues to improve to 53.9 down from 68.8 07/12/2019 Cancer antigen 15-3 continues to improve to 40 down from 50.9  On review of systems, pt reports extreme fatigue and stinging in her legs, issues balancing and denies belly pain, chest pain, SOB, weight loss and any other symptoms.   MEDICAL HISTORY:  Past Medical History:  Diagnosis Date   Cancer [facility])    Metastatic Breast Cancer    SURGICAL HISTORY: Past Surgical History:  Procedure Laterality Date   CHOLECYSTECTOMY     IR IMAGING GUIDED PORT INSERTION  07/14/2018   TONSILLECTOMY      SOCIAL HISTORY: Social History   Socioeconomic History   Marital status: Single    Spouse name: Not on  file   Number of children: Not on file   Years of education: Not on file   Highest education level: Not on file  Occupational History   Not on file  Social Needs   Financial resource strain: Not on file   Food insecurity    Worry: Not on file    Inability: Not on file   Transportation needs    Medical: Not on file    Non-medical: Not on file  Tobacco Use   Smoking status: Current Every Day Smoker    Packs/day: 0.15    Types: Cigarettes    Last attempt to quit: 01/21/2017    Years since quitting: 2.4   Smokeless tobacco: Never Used  Substance and Sexual Activity   Alcohol use: No    Alcohol/week: 0.0 standard drinks   Drug use: No   Sexual activity: Never  Lifestyle   Physical activity    Days per week: Not on file    Minutes per session: Not on file   Stress: Not on file  Relationships   Social connections    Talks on phone: Not on file    Gets together: Not on file    Attends religious service: Not on file    Active member of club or organization: Not on file    Attends meetings of clubs or organizations: Not on file    Relationship status: Not on file   Intimate partner  violence    Fear of current or ex partner: No    Emotionally abused: No    Physically abused: No    Forced sexual activity: No  Other Topics Concern   Not on file  Social History Narrative   Lives alone. 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 Has phobia of the medical system.  Cigarette smoker 1/2 PPD for about 40 yrs Social alcohol use No drugs Professor at [employer] no children  FAMILY HISTORY: Family History  Problem Relation Age of Onset   Cancer Mother        breast   Cancer Sister        breast  Mother with h/o breast cancer in her 14's Sister with breast cancer in her 82ys and later was diagnosed with multiple myeloma. (Patient is not aware of any specific breast cancer mutations present)  ALLERGIES:  is allergic to thorazine [chlorpromazine] and  ribociclib.  MEDICATIONS:  Current Outpatient Medications  Medication Sig Dispense Refill   aspirin EC 81 MG tablet Take 1 tablet (81 mg total) by mouth daily.     chlorhexidine (PERIDEX) 0.12 % solution Use as directed 15 mLs in the mouth or throat 2 (two) times daily as needed (mouth rinse after vomiting). Swish and spit 473 mL 1   dexamethasone (DECADRON) 1 MG tablet TAKE 1 TABLET(1 MG) BY MOUTH DAILY 30 tablet 2   escitalopram (LEXAPRO) 20 MG tablet TAKE 1 TABLET(20 MG) BY MOUTH DAILY 30 tablet 0   feeding supplement, ENSURE ENLIVE, (ENSURE ENLIVE) LIQD Take 237 mLs by mouth 2 (two) times daily between meals. (Patient taking differently: Take 237 mLs by mouth daily. ) 237 mL 12   hydrOXYzine (ATARAX/VISTARIL) 10 MG tablet Take 10 mg by mouth 2 (two) times daily.     JANUVIA 50 MG tablet TAKE 1 TABLET(50 MG) BY MOUTH DAILY 30 tablet 0   levETIRAcetam (KEPPRA) 1000 MG tablet Take 1 tablet (1,000 mg total) by mouth 2 (two) times daily. 60 tablet 5   pantoprazole (PROTONIX) 40 MG tablet TAKE 1 TABLET(40 MG) BY MOUTH DAILY 30 tablet 1   triamcinolone (KENALOG) 0.025 % ointment Apply 1 application topically 2 (two) times daily. Please in thin layer gently (without rubbing) over eczematoid rash over the left arm 30 g 0   Vitamin D, Ergocalciferol, (DRISDOL) 1.25 MG (50000 UT) CAPS capsule TAKE 1 CAPSULE BY MOUTH 3 TIMES WEEKLY (Patient not taking: Reported on 06/14/2019) 24 capsule 0   No current facility-administered medications for this visit.    Facility-Administered Medications Ordered in Other Visits  Medication Dose Route Frequency Provider Last Rate Last Dose   denosumab (XGEVA) injection 120 mg  120 mg Subcutaneous Once [name], MD       fulvestrant (FASLODEX) injection 500 mg  500 mg Intramuscular Once [name], MD        REVIEW OF SYSTEMS:    A 10+ POINT REVIEW OF SYSTEMS WAS OBTAINED including neurology, dermatology, psychiatry, cardiac,  respiratory, lymph, extremities, GI, GU, Musculoskeletal, constitutional, breasts, reproductive, HEENT.  All pertinent positives are noted in the HPI.  All others are negative.   PHYSICAL EXAMINATION:  ECOG PERFORMANCE STATUS: 1 - Symptomatic but completely ambulatory  Vitals:   07/12/19 1211  BP: 119/89  Pulse: 93  Resp: 18  Temp: 98.7 F (37.1 C)  SpO2: 99%   Filed Weights   07/12/19 1211  Weight: 170 lb 6.4 oz (77.3 kg)   Body mass index is 28.36 kg/m.  GENERAL:alert, in no acute distress and comfortable SKIN: no acute rashes, no significant lesions, palpable lipoma on the upper left back over the scapula EYES: conjunctiva are pink and non-injected, sclera anicteric OROPHARYNX: MMM, no exudates, no oropharyngeal erythema or ulceration NECK: supple, no JVD LYMPH:  no palpable lymphadenopathy in the cervical, axillary or inguinal regions LUNGS: clear to auscultation b/l with normal respiratory effort HEART: regular rate & rhythm ABDOMEN:  normoactive bowel sounds , non tender, not distended. No palpable hepatosplenomegaly.  Extremity: no pedal edema PSYCH: alert & oriented x 3 with fluent speech NEURO: no focal motor/sensory deficits    LABORATORY DATA:  I have reviewed the data as listed  CBC Latest Ref Rng & Units 07/12/2019 06/28/2019 06/14/2019  WBC 4.0 - 10.5 K/uL 7.9 16.2(H) 3.8(L)  Hemoglobin 12.0 - 15.0 g/dL 12.6 12.9 11.6(L)  Hematocrit 36.0 - 46.0 % 38.6 39.9 36.0  Platelets 150 - 400 K/uL 159 155 153   CBC    Component Value Date/Time   WBC 7.9 07/12/2019 1122   RBC 4.06 07/12/2019 1122   HGB 12.6 07/12/2019 1122   HGB 12.7 04/12/2019 0901   HGB 13.9 11/24/2017 1340   HCT 38.6 07/12/2019 1122   HCT 42.0 11/24/2017 1340   PLT 159 07/12/2019 1122   PLT 198 04/12/2019 0901   PLT 212 11/24/2017 1340   MCV 95.1 07/12/2019 1122   MCV 97.0 11/24/2017 1340   MCH 31.0 07/12/2019 1122   MCHC 32.6 07/12/2019 1122   RDW 16.6 (H) 07/12/2019 1122   RDW 14.1  11/24/2017 1340   LYMPHSABS 0.7 07/12/2019 1122   LYMPHSABS 1.4 11/24/2017 1340   MONOABS 0.9 07/12/2019 1122   MONOABS 0.3 11/24/2017 1340   EOSABS 0.1 07/12/2019 1122   EOSABS 0.1 11/24/2017 1340   BASOSABS 0.1 07/12/2019 1122   BASOSABS 0.1 11/24/2017 1340    . CMP Latest Ref Rng & Units 07/12/2019 06/28/2019 06/14/2019  Glucose 70 - 99 mg/dL 217(H) 230(H) 395(H)  BUN 8 - 23 mg/dL 17 15 14   Creatinine 0.44 - 1.00 mg/dL 0.71 0.75 0.82  Sodium 135 - 145 mmol/L 137 137 136  Potassium 3.5 - 5.1 mmol/L 3.6 3.8 3.7  Chloride 98 - 111 mmol/L 103 100 99  CO2 22 - 32 mmol/L 25 24 27   Calcium 8.9 - 10.3 mg/dL 9.1 9.6 9.4  Total Protein 6.5 - 8.1 g/dL 6.4(L) 6.4(L) 6.2(L)  Total Bilirubin 0.3 - 1.2 mg/dL 0.5 0.6 0.5  Alkaline Phos 38 - 126 U/L 311(H) 297(H) 250(H)  AST 15 - 41 U/L 67(H) 36 50(H)  ALT 0 - 44 U/L 97(H) 72(H) 84(H)   07/18/18 Pathology:          RADIOGRAPHIC STUDIES: I have personally reviewed the radiological images as listed and agreed with the findings in the report. No results found.  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 Multifocal tumor in the left breast with biopsy-proven left axillary metastases.   Noted to have extensive bone metastases and pulmonary metastases. Patient was noted to have calvarial metastases but no overt parenchymal metastasis 09/14/17 CT Chest/ad/pelvis Results discussed in details - good response.  02/22/18 CT revealed  Newly apparent 2.1 by 2.0 cm rim enhancing lesion in the right hepatic lobe suspicious for a metastatic lesion. This was not apparent on prior exams although the prior exams were all noncontrast and thus the lesion may have  been present but with reduced conspicuity. There is also a small enhancing lesion further posteriorly in the right hepatic lobe which is technically nonspecific and could be a small  hemangioma or a small metastatic lesion. Stable distribution and appearance of prior sclerotic osseous metastatic disease without bony progression. 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   07/06/18 CT C/A/P which revealed New pattern of heterogeneous enhancement and enhancing ill-defined lesions in the central LEFT and RIGHT hepatic lobe at site prior enhancing lesion is highly concerning for progression of of infiltrative malignancy in the liver occupying a large portion of the RIGHT hepatic lobe and central LEFT hepatic lobe. 2. Small amount of free fluid in the abdomen pelvis. 3. No metastatic adenopathy.4. No evidence pulmonary metastasis or lymphadenopathy. 5. Stable dense sclerotic skeletal metastasis   07/11/18 ECHO revealed LV EF of 55%-60%   07/18/18 Liver needle/core biopsy right lesion consi with Ki67 at 40%, ER/PR +ve   07/28/18 MRI Abdomen MRCP revealed Extensive hepatic metastatic disease, possibly progressive from recent abdominal CT. 2. No evidence of intrahepatic or extrahepatic biliary dilatation. Elevated liver function studies may be secondary to intrahepatic cholestasis. 3. Increased ascites without peritoneal nodularity or suspicious enhancement. 4. Grossly stable widespread osseous metastatic disease.   S/p 6 cycles of AC completed on 11/01/18  03/20/19 CT C/A/P revealed "Although the dominant liver lesion appears improved in the interval, there appears to be 2 new liver metastases on today's exam, concerning for disease progression. 2. Similar appearance of the widespread sclerotic bone metastases. 3. Interval decrease in ascites. 4. Aortic Atherosclerois."  Pt's planned third weekly infusion of Taxol was cancelled due to dental abscess s/p antibiotic course. Then cancelled again due to thrombocytopenia thought to be due to antibiotic course.  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 Much  improved back pain.  # 3 Neutropenia Related to her Ribociclib - resolved. Patient is currently on Verzenio and has not developed any neutropenia. This is being monitored.  #4 Increased LFTs due to new liver metastases  #5 Cancer related pain - controlled - status post palliative RT to spine - no currently needing any pain medications.  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 No other new medication. Now resolved. Monitoring on increasing doses of Verzenio. No issues with recurrent rash on Verzenio 124m po BID  #7 Brain metastasis  07/20/18 CT Head revealed 1.8 x 2.4 x 3.9 cm LEFT frontal cortical based mass with possible dural component highly concerning for metastatic disease, less likely abscess or cerebritis. Severe vasogenic edema with 5 mm LEFT to RIGHT subfalcine herniation. No ventricular entrapment. 2. 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 3. Multiple calvarial metastasis. 4. Cerebellar atrophy.    07/21/18 MRI Brain revealed  Irregular plaque-like mass, likely metastasis, measuring up to 5.1 cm, appears to originate from the left anterolateral frontal dura with invasion of the underlying frontal lobe of the brain. Mass effect from brain edema and the lesion partially effaces the frontal horn of left lateral ventricle and results in 7 mm left-to-right midline shift of anterior septum pellucidum. 2. Diffuse dural thickening over the left cerebral convexity may represent edema associated with the tumor or invasive neoplasm. 3. 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   07/28/18 MRI Brain revealed Extensive nodular dural enhancement left frontal lobe with associated  enhancing mass growing in the left frontal lobe is unchanged in size and compatible with metastatic disease. This is associated with metastatic disease to the left frontal bone. Extensive edema in the left frontal  lobe has progressed in the interval. Blastic metastatic disease throughout the calvarium. Metastatic disease in the cervical spine. Enhancing mass along the floor of the posterior fossa on the right is stable and most consistent with meningioma. 6 mm enhancing mass in the prepontine cistern on the right also unchanged and compatible with meningioma.   s/p SRS radiation therapy  10/23/18 MRI Brain revealed SRS protocol demonstrating treatment response: Regression of LEFT frontal dural metastasis, decreased vasogenic edema and parenchymal invasion. Similar calvarial metastasis. 2. Stable appearance of 2 small posterior fossa meningiomas. 3. No acute intracranial process.   02/02/19 MRI Brain revealed "Marked progression of dural disease left greater than right as described above. Pronounced brain edema, left more than right with left-to-right shift of 1 cm. 2. I do not identify any brain parenchymal metastases. 3. Small stable incidental meningiomas in the posterior fossa as above."  Pt is s/p SRS for dural metastases.  06/07/2019 MRI brain w and wo contrast revealed "1. Progression of disease disease as evidenced by new dural metastasis along the inferolateral left temporal lobe with mild adjacent brain edema, see image 13:43. 2. Positive response at the intervally treated diffuse dural metastatic disease elsewhere along the bilateral cerebral convexity. 3. Posterior fossa meningiomas are stable. 4. No primary parenchymal metastasis."  PLAN:  -Continue follow up with Dr. Mickeal Skinner in Neuro-oncology on 02/06/2019 and Dr. Tammi Klippel in [place] with Delton See every 4 weeks   #8 Vitamin D deficiency Vit D levels improved to 44.8  On 12/01/2018 -continue Vit D replacement.  #9 h/o Left upper extremity swelling- [place] neg for DVT - now swelling resolved.   PLAN:  -Discussed pt labwork today, 07/12/2019; her blood work stable. Liver enzymes are trending a little high from chemotherapy vs liver mets -ca  15-3 and ca 27-29 tumor markers are improved today. -Counselled pt on potential issues with changes to depression medication. Will switch from Lexapro to CYmbalta to help with treatment of her neuropathy as well. -After reviewing labs and CT scan, will decide whether to proceed with chemotherapy -Continue Xgeva shots and monthly Faslodex. -Will hold Taxol today because of neuropathy and fatigue and FTT -Will switch from Lexapro to Cymbalta for additional assistance for neuropathy.  -Will see the pt back in 2 weeks   F/u for Bone scan as schedule on 07/20/2019 Plz schedule CT chest/abd/pelvis as ordered on 8/12 in 1 week RTC with dr Irene Limbo in 2 weeks (In person or phone visit per patient preference)   All of the patients questions were answered with apparent satisfaction. The patient knows to call the clinic with any problems, questions or concerns.  The total time spent in the appt was 25 minutes and more than 50% was on counseling and direct patient cares.  Sullivan Lone MD [place] AAHIVMS [facility] Hematology/Oncology Physician [facility]  (Office):       [phone] (Work cell):  [phone] (Fax):           [phone]  I, De Burrs, am acting as a scribe for Dr. Irene Limbo  .I have reviewed the above documentation for accuracy and completeness, and I agree with the above. [name] MD

## 2019-07-10 ENCOUNTER — Inpatient Hospital Stay: Payer: BC Managed Care – PPO

## 2019-07-12 ENCOUNTER — Inpatient Hospital Stay: Payer: BC Managed Care – PPO

## 2019-07-12 ENCOUNTER — Ambulatory Visit: Payer: BC Managed Care – PPO

## 2019-07-12 ENCOUNTER — Other Ambulatory Visit: Payer: Self-pay

## 2019-07-12 ENCOUNTER — Inpatient Hospital Stay (HOSPITAL_BASED_OUTPATIENT_CLINIC_OR_DEPARTMENT_OTHER): Payer: BC Managed Care – PPO | Admitting: Hematology

## 2019-07-12 VITALS — BP 119/89 | HR 93 | Temp 98.7°F | Resp 18 | Ht 65.0 in | Wt 170.4 lb

## 2019-07-12 DIAGNOSIS — C7931 Secondary malignant neoplasm of brain: Secondary | ICD-10-CM

## 2019-07-12 DIAGNOSIS — Z5111 Encounter for antineoplastic chemotherapy: Secondary | ICD-10-CM | POA: Diagnosis not present

## 2019-07-12 DIAGNOSIS — C50919 Malignant neoplasm of unspecified site of unspecified female breast: Secondary | ICD-10-CM

## 2019-07-12 DIAGNOSIS — Z95828 Presence of other vascular implants and grafts: Secondary | ICD-10-CM

## 2019-07-12 DIAGNOSIS — C787 Secondary malignant neoplasm of liver and intrahepatic bile duct: Secondary | ICD-10-CM | POA: Diagnosis not present

## 2019-07-12 DIAGNOSIS — Z7189 Other specified counseling: Secondary | ICD-10-CM

## 2019-07-12 DIAGNOSIS — C7951 Secondary malignant neoplasm of bone: Secondary | ICD-10-CM

## 2019-07-12 LAB — CMP (CANCER CENTER ONLY)
ALT: 97 U/L — ABNORMAL HIGH (ref 0–44)
AST: 67 U/L — ABNORMAL HIGH (ref 15–41)
Albumin: 3.3 g/dL — ABNORMAL LOW (ref 3.5–5.0)
Alkaline Phosphatase: 311 U/L — ABNORMAL HIGH (ref 38–126)
Anion gap: 9 (ref 5–15)
BUN: 17 mg/dL (ref 8–23)
CO2: 25 mmol/L (ref 22–32)
Calcium: 9.1 mg/dL (ref 8.9–10.3)
Chloride: 103 mmol/L (ref 98–111)
Creatinine: 0.71 mg/dL (ref 0.44–1.00)
GFR, Est AFR Am: >60 mL/min (ref >60)
GFR, Estimated: >60 mL/min (ref >60)
Glucose, Bld: 217 mg/dL — ABNORMAL HIGH (ref 70–99)
Potassium: 3.6 mmol/L (ref 3.5–5.1)
Sodium: 137 mmol/L (ref 135–145)
Total Bilirubin: 0.5 mg/dL (ref 0.3–1.2)
Total Protein: 6.4 g/dL — ABNORMAL LOW (ref 6.5–8.1)

## 2019-07-12 LAB — CBC WITH DIFFERENTIAL/PLATELET
Abs Immature Granulocytes: 0.06 10*3/uL (ref 0.00–0.07)
Basophils Absolute: 0.1 10*3/uL (ref 0.0–0.1)
Basophils Relative: 1 %
Eosinophils Absolute: 0.1 10*3/uL (ref 0.0–0.5)
Eosinophils Relative: 1 %
HCT: 38.6 % (ref 36.0–46.0)
Hemoglobin: 12.6 g/dL (ref 12.0–15.0)
Immature Granulocytes: 1 %
Lymphocytes Relative: 9 %
Lymphs Abs: 0.7 10*3/uL (ref 0.7–4.0)
MCH: 31 pg (ref 26.0–34.0)
MCHC: 32.6 g/dL (ref 30.0–36.0)
MCV: 95.1 fL (ref 80.0–100.0)
Monocytes Absolute: 0.9 10*3/uL (ref 0.1–1.0)
Monocytes Relative: 12 %
Neutro Abs: 6.1 10*3/uL (ref 1.7–7.7)
Neutrophils Relative %: 76 %
Platelets: 159 10*3/uL (ref 150–400)
RBC: 4.06 MIL/uL (ref 3.87–5.11)
RDW: 16.6 % — ABNORMAL HIGH (ref 11.5–15.5)
WBC: 7.9 10*3/uL (ref 4.0–10.5)
nRBC: 0 % (ref 0.0–0.2)

## 2019-07-12 MED ORDER — HEPARIN SOD (PORK) LOCK FLUSH 100 UNIT/ML IV SOLN
500.0000 [IU] | Freq: Once | INTRAVENOUS | Status: DC
  Filled 2019-07-12: qty 5

## 2019-07-12 MED ORDER — FULVESTRANT 250 MG/5ML IM SOLN
500.0000 mg | Freq: Once | INTRAMUSCULAR | Status: AC
  Administered 2019-07-12: 500 mg via INTRAMUSCULAR

## 2019-07-12 MED ORDER — SODIUM CHLORIDE 0.9% FLUSH
10.0000 mL | Freq: Once | INTRAVENOUS | Status: DC
  Filled 2019-07-12: qty 10

## 2019-07-12 MED ORDER — SODIUM CHLORIDE 0.9% FLUSH
10.0000 mL | Freq: Once | INTRAVENOUS | Status: AC
  Administered 2019-07-12: 10 mL
  Filled 2019-07-12: qty 10

## 2019-07-12 MED ORDER — FULVESTRANT 250 MG/5ML IM SOLN
INTRAMUSCULAR | Status: AC
  Filled 2019-07-12: qty 10

## 2019-07-12 NOTE — Progress Notes (Signed)
Faslodex 250 mg x 2  Injections given IM in right and left ventrogluteals as per order.   Lot    [id number]  ;   Exp     03 - 2023.

## 2019-07-13 ENCOUNTER — Telehealth: Payer: Self-pay | Admitting: Hematology

## 2019-07-13 LAB — CANCER ANTIGEN 15-3: CA 15-3: 40.7 U/mL — ABNORMAL HIGH (ref 0.0–25.0)

## 2019-07-13 LAB — CANCER ANTIGEN 27.29: CA 27.29: 53.9 U/mL — ABNORMAL HIGH (ref 0.0–38.6)

## 2019-07-13 NOTE — Telephone Encounter (Signed)
Scheduled appt per 8/20 los.  Left a very detailed message of her appt date and time and with central radiology number.  I also contacted [name] who is a contact in the patient chart and informed her that I left a VM and she stated she will let the patient know.

## 2019-07-15 ENCOUNTER — Emergency Department (HOSPITAL_COMMUNITY)
Admission: EM | Admit: 2019-07-15 | Discharge: 2019-07-15 | Disposition: A | Discharge status: Home / Self Care | Payer: BC Managed Care – PPO | Attending: Emergency Medicine | Admitting: Emergency Medicine

## 2019-07-15 ENCOUNTER — Other Ambulatory Visit: Payer: Self-pay

## 2019-07-15 ENCOUNTER — Encounter (HOSPITAL_COMMUNITY): Payer: Self-pay | Admitting: *Deleted

## 2019-07-15 DIAGNOSIS — Z85118 Personal history of other malignant neoplasm of bronchus and lung: Secondary | ICD-10-CM | POA: Insufficient documentation

## 2019-07-15 DIAGNOSIS — Z853 Personal history of malignant neoplasm of breast: Secondary | ICD-10-CM | POA: Insufficient documentation

## 2019-07-15 DIAGNOSIS — F17210 Nicotine dependence, cigarettes, uncomplicated: Secondary | ICD-10-CM | POA: Diagnosis not present

## 2019-07-15 DIAGNOSIS — S61412A Laceration without foreign body of left hand, initial encounter: Secondary | ICD-10-CM | POA: Insufficient documentation

## 2019-07-15 DIAGNOSIS — Z85841 Personal history of malignant neoplasm of brain: Secondary | ICD-10-CM | POA: Diagnosis not present

## 2019-07-15 DIAGNOSIS — Z23 Encounter for immunization: Secondary | ICD-10-CM | POA: Diagnosis not present

## 2019-07-15 DIAGNOSIS — Y9301 Activity, walking, marching and hiking: Secondary | ICD-10-CM | POA: Diagnosis not present

## 2019-07-15 DIAGNOSIS — Z79899 Other long term (current) drug therapy: Secondary | ICD-10-CM | POA: Insufficient documentation

## 2019-07-15 DIAGNOSIS — W010XXA Fall on same level from slipping, tripping and stumbling without subsequent striking against object, initial encounter: Secondary | ICD-10-CM | POA: Insufficient documentation

## 2019-07-15 DIAGNOSIS — Y92017 Garden or yard in single-family (private) house as the place of occurrence of the external cause: Secondary | ICD-10-CM | POA: Insufficient documentation

## 2019-07-15 DIAGNOSIS — Y998 Other external cause status: Secondary | ICD-10-CM | POA: Diagnosis not present

## 2019-07-15 DIAGNOSIS — S6992XA Unspecified injury of left wrist, hand and finger(s), initial encounter: Secondary | ICD-10-CM | POA: Diagnosis present

## 2019-07-15 MED ORDER — LIDOCAINE HCL 2 % IJ SOLN
20.0000 mL | Freq: Once | INTRAMUSCULAR | Status: AC
  Administered 2019-07-15: 400 mg via INTRADERMAL
  Filled 2019-07-15: qty 20

## 2019-07-15 MED ORDER — TETANUS-DIPHTH-ACELL PERTUSSIS 5-2.5-18.5 LF-MCG/0.5 IM SUSP
0.5000 mL | Freq: Once | INTRAMUSCULAR | Status: AC
  Administered 2019-07-15: 0.5 mL via INTRAMUSCULAR
  Filled 2019-07-15: qty 0.5

## 2019-07-15 NOTE — ED Triage Notes (Signed)
Pt was out on deck when she fell cutting left hand , dressing intact to control bleeding. No LOC or ansy other injuries reported

## 2019-07-15 NOTE — Discharge Instructions (Addendum)
Keep the area clean and dry.  Have the sutures out in 10 to 14 days.  Return here for any signs of infection.

## 2019-07-15 NOTE — ED Provider Notes (Signed)
[facility] DEPT Provider Note   CSN: [id number] Arrival date & time: 07/15/19  1618     History   Chief Complaint Chief Complaint  Patient presents with  . Fall  . Laceration    HPI Stacey Cox is a 63 y.o. female.     HPI Patient presents to the emergency department with laceration to the left palm that occurred just prior to arrival.  Patient states that she tripped and fell on her patio and developed this linear laceration to the palm of the hand.  The patient states that she control the bleeding with direct pressure.  Patient states that she has no other injuries.  Patient denies any numbness or weakness in the hand. Past Medical History:  Diagnosis Date  . Cancer [facility])    Metastatic Breast Cancer    Patient Active Problem List   Diagnosis Date Noted  . Palliative care by specialist   . DNR (do not resuscitate)   . Expressive aphasia   . Brain metastasis ([place]) 01/27/2019  . Seizures ([place]) 07/21/2018  . Macrocytic anemia 07/21/2018  . Thrombocytopenia ([place]) 07/21/2018  . Mild renal insufficiency 07/21/2018  . Anxiety 07/21/2018  . Brain metastases ([place])   . Malignant neoplasm of female breast ([place])   . Breast cancer metastasized to brain ([place]) 07/20/2018  . Port-A-Cath in place 07/17/2018  . Liver metastases ([place]) 07/12/2018  . Genetic testing 08/08/2017  . Palliative care status   . Counseling regarding advance care planning and goals of care 03/25/2017  . 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 Nausea without vomiting 03/21/2017  . Bone metastases ([place]) 02/01/2017  . Osseous metastasis ([place])   . Malignant neoplasm metastatic to lung ([place])   . Cancer associated pain   . Carcinoma of breast metastatic to brain ([place]) 01/21/2017  . Pathologic fracture   . Breast mass, left     Past Surgical History:  Procedure Laterality Date  . CHOLECYSTECTOMY    . IR IMAGING GUIDED PORT INSERTION  07/14/2018  . TONSILLECTOMY        OB History   No obstetric history on file.      Home Medications    Prior to Admission medications   Medication Sig Start Date End Date Taking? Authorizing Provider  aspirin EC 81 MG tablet Take 1 tablet (81 mg total) by mouth daily. 08/01/17   [name], MD  chlorhexidine (PERIDEX) 0.12 % solution Use as directed 15 mLs in the mouth or throat 2 (two) times daily as needed (mouth rinse after vomiting). Swish and spit 04/05/19   [name], MD  dexamethasone (DECADRON) 1 MG tablet TAKE 1 TABLET(1 MG) BY MOUTH DAILY 05/28/19   Vaslow, Acey Lav, MD  escitalopram (LEXAPRO) 20 MG tablet TAKE 1 TABLET(20 MG) BY MOUTH DAILY 07/02/19   [name], MD  feeding supplement, ENSURE ENLIVE, (ENSURE ENLIVE) LIQD Take 237 mLs by mouth 2 (two) times daily between meals. Patient taking differently: Take 237 mLs by mouth daily.  07/22/18   Raiford Noble Latif, DO  hydrOXYzine (ATARAX/VISTARIL) 10 MG tablet Take 10 mg by mouth 2 (two) times daily. 01/01/19   [provider]  JANUVIA 50 MG tablet TAKE 1 TABLET(50 MG) BY MOUTH DAILY 07/02/19   [name], MD  levETIRAcetam (KEPPRA) 1000 MG tablet Take 1 tablet (1,000 mg total) by mouth 2 (two) times daily. 03/26/19   Ventura Sellers, MD  pantoprazole (PROTONIX) 40 MG tablet TAKE 1 TABLET(40 MG)  BY MOUTH DAILY 07/02/19   [name], MD  triamcinolone (KENALOG) 0.025 % ointment Apply 1 application topically 2 (two) times daily. Please in thin layer gently (without rubbing) over eczematoid rash over the left arm 05/22/19   [name], MD  Vitamin D, Ergocalciferol, (DRISDOL) 1.25 MG (50000 UT) CAPS capsule TAKE 1 CAPSULE BY MOUTH 3 TIMES WEEKLY Patient not taking: Reported on 06/14/2019 02/05/19   [name], MD    Family History Family History  Problem Relation Age of Onset  . Cancer Mother        breast  . Cancer Sister        breast    Social History Social History   Tobacco  Use  . Smoking status: Current Every Day Smoker    Packs/day: 0.15    Types: Cigarettes    Last attempt to quit: 01/21/2017    Years since quitting: 2.4  . Smokeless tobacco: Never Used  Substance Use Topics  . Alcohol use: No    Alcohol/week: 0.0 standard drinks  . Drug use: No     Allergies   Thorazine [chlorpromazine] and Ribociclib   Review of Systems Review of Systems All other systems negative except as documented in the HPI. All pertinent positives and negatives as reviewed in the HPI.  Physical Exam Updated Vital Signs BP 107/79 (BP Location: Right Arm)   Pulse (!) 120   Temp 98.6 F (37 C) (Oral)   Resp 18   SpO2 97%   Physical Exam Vitals signs and nursing note reviewed.  Constitutional:      General: She is not in acute distress.    Appearance: She is well-developed.  HENT:     Head: Normocephalic and atraumatic.  Eyes:     Pupils: Pupils are equal, round, and reactive to light.  Pulmonary:     Effort: Pulmonary effort is normal.  Musculoskeletal:       Hands:  Skin:    General: Skin is warm and dry.  Neurological:     Mental Status: She is alert and oriented to person, place, and time.      ED Treatments / Results  Labs (all labs ordered are listed, but only abnormal results are displayed) Labs Reviewed - No data to display  EKG None  Radiology No results found.  Procedures Procedures (including critical care time)  Medications Ordered in ED Medications  Tdap (BOOSTRIX) injection 0.5 mL (0.5 mLs Intramuscular Given 07/15/19 1729)  lidocaine (XYLOCAINE) 2 % (with pres) injection 400 mg (400 mg Intradermal Given 07/15/19 1729)     Initial Impression / Assessment and Plan / ED Course  I have reviewed the triage vital signs and the nursing notes.  Pertinent labs & imaging results that were available during my care of the patient were reviewed by me and considered in my medical decision making (see chart for details).         LACERATION REPAIR Performed by: Brent General Authorized by: Resa Miner Lizzeth Meder Consent: Verbal consent obtained. Risks and benefits: risks, benefits and alternatives were discussed Consent given by: patient Patient identity confirmed: provided demographic data Prepped and Draped in normal sterile fashion Wound explored  Laceration Location: Left palm at the base of the thumb.  Laceration Length: 3 cm  No Foreign Bodies seen or palpated  Anesthesia: local infiltration  Local anesthetic: lidocaine 2 % without epinephrine  Anesthetic total: 5 ml  Irrigation method: syringe Amount of cleaning: standard  Skin closure: 4-0  Prolene  Number of sutures: 4  Technique: Simple interrupted  Patient tolerance: Patient tolerated the procedure well with no immediate complications.  Patient is advised to have the sutures out in 10 to 14 days.  I told her to keep the area clean and dry.  Told to follow-up if any signs of infection. Final Clinical Impressions(s) / ED Diagnoses   Final diagnoses:  None    ED Discharge Orders    None       Dalia Heading, PA-C 07/15/19 [place], [place], DO 07/19/19 [phone]

## 2019-07-16 ENCOUNTER — Telehealth: Payer: Self-pay | Admitting: *Deleted

## 2019-07-16 MED ORDER — DULOXETINE HCL 30 MG PO CPEP: 30.0000 mg | ORAL_CAPSULE | Freq: Every day | ORAL | 3 refills | Status: DC

## 2019-07-16 NOTE — Telephone Encounter (Signed)
Patient called - states new medication never sent to pharmacy (Cymbalta?) Also, she said she fell again on Sunday. Laceration left palm, went to ED - 4 sutures. Dr. Irene Limbo informed

## 2019-07-16 NOTE — Telephone Encounter (Signed)
Contacted patient. She states she was sitting on her deck and when she stood up, just 'gave out' and fell. States she wasn't really dizzy - just felt weak. MD informed.

## 2019-07-17 ENCOUNTER — Telehealth: Payer: Self-pay | Admitting: *Deleted

## 2019-07-17 NOTE — Telephone Encounter (Signed)
Attempted to contact patient with following information from Dr. Irene Limbo: "Cymbalta sent to her pharmacy. She would hold the lexapro and switch to Cymbalta. We are start slow on the cymbalta and will optimize it gradually as needed." Left voice mail with information. Encouraged patient to contact office if needed.

## 2019-07-19 ENCOUNTER — Telehealth: Payer: Self-pay | Admitting: *Deleted

## 2019-07-19 ENCOUNTER — Other Ambulatory Visit: Payer: Self-pay

## 2019-07-19 ENCOUNTER — Encounter (HOSPITAL_COMMUNITY): Payer: Self-pay

## 2019-07-19 ENCOUNTER — Ambulatory Visit (HOSPITAL_COMMUNITY): Payer: BC Managed Care – PPO

## 2019-07-19 ENCOUNTER — Encounter (HOSPITAL_COMMUNITY)
Admission: RE | Admit: 2019-07-19 | Discharge: 2019-07-19 | Disposition: A | Discharge status: Home / Self Care | Payer: BC Managed Care – PPO | Source: Ambulatory Visit | Attending: Hematology | Admitting: Hematology

## 2019-07-19 ENCOUNTER — Ambulatory Visit (HOSPITAL_COMMUNITY)
Admission: RE | Admit: 2019-07-19 | Discharge: 2019-07-19 | Disposition: A | Discharge status: Home / Self Care | Payer: BC Managed Care – PPO | Source: Ambulatory Visit | Attending: Hematology | Admitting: Hematology

## 2019-07-19 DIAGNOSIS — C787 Secondary malignant neoplasm of liver and intrahepatic bile duct: Secondary | ICD-10-CM | POA: Diagnosis present

## 2019-07-19 DIAGNOSIS — C7931 Secondary malignant neoplasm of brain: Secondary | ICD-10-CM | POA: Diagnosis present

## 2019-07-19 DIAGNOSIS — C7951 Secondary malignant neoplasm of bone: Secondary | ICD-10-CM

## 2019-07-19 DIAGNOSIS — C50919 Malignant neoplasm of unspecified site of unspecified female breast: Secondary | ICD-10-CM

## 2019-07-19 MED ORDER — IOHEXOL 300 MG/ML  SOLN
100.0000 mL | Freq: Once | INTRAMUSCULAR | Status: AC | PRN
  Administered 2019-07-19: 14:00:00 100 mL via INTRAVENOUS

## 2019-07-19 MED ORDER — HEPARIN SOD (PORK) LOCK FLUSH 100 UNIT/ML IV SOLN
500.0000 [IU] | Freq: Once | INTRAVENOUS | Status: AC
  Administered 2019-07-19: 14:00:00 500 [IU] via INTRAVENOUS

## 2019-07-19 MED ORDER — SODIUM CHLORIDE (PF) 0.9 % IJ SOLN
INTRAMUSCULAR | Status: AC
  Filled 2019-07-19: qty 50

## 2019-07-19 MED ORDER — HEPARIN SOD (PORK) LOCK FLUSH 100 UNIT/ML IV SOLN
INTRAVENOUS | Status: AC
  Filled 2019-07-19: qty 5

## 2019-07-19 MED ORDER — TECHNETIUM TC 99M MEDRONATE IV KIT
20.5000 | PACK | Freq: Once | INTRAVENOUS | Status: AC | PRN
  Administered 2019-07-19: 20.5 via INTRAVENOUS

## 2019-07-19 NOTE — Telephone Encounter (Signed)
Received faxed order for Dr. Irene Limbo signature for [facility] nurse to remove staples in left palm week of 9/6 during home visit. Dr. Irene Limbo signed - faxed to [phone], fax confirmation received.

## 2019-07-19 NOTE — Telephone Encounter (Signed)
Patient called. She started Cymbalta 4 days ago and states legs/feet still tingling. Advised her that medication may take a few more days to decrease symptoms. She should contact office if symptoms do not improve or if they worsen and that Dr.Kale will be informed.

## 2019-07-20 ENCOUNTER — Ambulatory Visit (INDEPENDENT_AMBULATORY_CARE_PROVIDER_SITE_OTHER): Payer: BC Managed Care – PPO | Admitting: Internal Medicine

## 2019-07-20 ENCOUNTER — Other Ambulatory Visit: Payer: Self-pay

## 2019-07-20 VITALS — BP 114/68 | HR 122 | Temp 98.7°F | Ht 65.0 in | Wt 167.8 lb

## 2019-07-20 DIAGNOSIS — E1165 Type 2 diabetes mellitus with hyperglycemia: Secondary | ICD-10-CM | POA: Diagnosis not present

## 2019-07-20 DIAGNOSIS — R739 Hyperglycemia, unspecified: Secondary | ICD-10-CM

## 2019-07-20 LAB — POCT GLYCOSYLATED HEMOGLOBIN (HGB A1C): Hemoglobin A1C: 8.9 % — AB (ref 4.0–5.6)

## 2019-07-20 MED ORDER — GLIPIZIDE 5 MG PO TABS: 5.0000 mg | ORAL_TABLET | Freq: Two times a day (BID) | ORAL | 3 refills | Status: DC

## 2019-07-20 NOTE — Patient Instructions (Addendum)
-   Continue Januvia , 1 tablet daily  - Start Glipizide 5 mg, 1 tablet before breakfast and Supper   - Check sugar before Breakfast and Supper  - If your sugars are consistent over 180 after 2 weeks of being on he new ,medicine please contact us.  - Please see an eye doctor as soon as you can      - HOW TO TREAT LOW BLOOD SUGARS (Blood sugar LESS THAN 70 MG/DL)  Please follow the RULE OF 15 for the treatment of hypoglycemia treatment (when your (blood sugars are less than 70 mg/dL)    STEP 1: Take 15 grams of carbohydrates when your blood sugar is low, which includes:   3-4 GLUCOSE TABS  OR  3-4 OZ OF JUICE OR REGULAR SODA OR  ONE TUBE OF GLUCOSE GEL     STEP 2: RECHECK blood sugar in 15 MINUTES STEP 3: If your blood sugar is still low at the 15 minute recheck --> then, go back to STEP 1 and treat AGAIN with another 15 grams of carbohydrates.

## 2019-07-20 NOTE — Progress Notes (Signed)
Name: Stacey Cox  MRN/ DOB: [id number], 1956/02/12   Age/ Sex: 63 y.o., female    PCP: [name], MD   Reason for Endocrinology Evaluation: DM     Date of Initial Endocrinology Visit: 07/23/2019     PATIENT IDENTIFIER: Stacey Cox is a 63 y.o. female with a past medical history of Metastatic Breast Ca (S/P chemo and radiation) . The patient presented for initial endocrinology clinic visit on 07/23/2019 for consultative assistance with her diabetes management.    HPI: Ms. Mccathern has 2 of her friends on the phone listening to the visit    Pt with metastatic Breast Ca diagnosed in 2018. She is S/P Chemo and radiation. She is on Dexamethasone 1 mg daily.  She was noted with hyperglycemia in 05/2019 with a BG's > 200 mg/dL . She has no prior diagnosis of diabetes.   Currently checking blood sugars 1x / day Hypoglycemia episodes : no Hemoglobin A1c  Patient required assistance for hypoglycemia: no Patient has required hospitalization within the last 1 year from hyper or hypoglycemia: no  In terms of diet, the patient eats 2 meals a day    HOME DIABETES REGIMEN: Januvia   Dexamethasone 1 mg daily in the morning    METER DOWNLOAD SUMMARY: Unable to download    DIABETIC COMPLICATIONS: Microvascular complications:   Neuropathy  Denies: CKD  Last eye exam: Completed 2019  Macrovascular complications:    Denies: CAD, PVD, CVA   PAST HISTORY: Past Medical History:  Past Medical History:  Diagnosis Date  . Cancer [facility])    Metastatic Breast Cancer   Past Surgical History:  Past Surgical History:  Procedure Laterality Date  . CHOLECYSTECTOMY    . IR IMAGING GUIDED PORT INSERTION  07/14/2018  . TONSILLECTOMY        Social History:  reports that she has been smoking cigarettes. She has been smoking about 0.15 packs per day. She has never used smokeless tobacco. She reports that she does not drink alcohol or use drugs. Family History:  Family History   Problem Relation Age of Onset  . Cancer Mother        breast  . Cancer Sister        breast     HOME MEDICATIONS: Allergies as of 07/20/2019      Reactions   Thorazine [chlorpromazine] Anaphylaxis   Ribociclib Other (See Comments)   Exfoliative dermatitis      Medication List       Accurate as of July 20, 2019 11:59 PM. If you have any questions, ask your nurse or doctor.        aspirin EC 81 MG tablet Take 1 tablet (81 mg total) by mouth daily.   chlorhexidine 0.12 % solution Commonly known as: PERIDEX Use as directed 15 mLs in the mouth or throat 2 (two) times daily as needed (mouth rinse after vomiting). Swish and spit   dexamethasone 1 MG tablet Commonly known as: DECADRON TAKE 1 TABLET(1 MG) BY MOUTH DAILY   DULoxetine 30 MG capsule Commonly known as: CYMBALTA Take 1 capsule (30 mg total) by mouth daily.   feeding supplement (ENSURE ENLIVE) Liqd Take 237 mLs by mouth 2 (two) times daily between meals.   glipiZIDE 5 MG tablet Commonly known as: GLUCOTROL Take 1 tablet (5 mg total) by mouth 2 (two) times daily before a meal. Started by: Dorita Sciara, MD   hydrOXYzine 10 MG tablet Commonly known as: ATARAX/VISTARIL Take 10 mg by  mouth 2 (two) times daily.   Januvia 50 MG tablet Generic drug: sitaGLIPtin TAKE 1 TABLET(50 MG) BY MOUTH DAILY   levETIRAcetam 1000 MG tablet Commonly known as: Keppra Take 1 tablet (1,000 mg total) by mouth 2 (two) times daily.   pantoprazole 40 MG tablet Commonly known as: PROTONIX TAKE 1 TABLET(40 MG) BY MOUTH DAILY   triamcinolone 0.025 % ointment Commonly known as: KENALOG Apply 1 application topically 2 (two) times daily. Please in thin layer gently (without rubbing) over eczematoid rash over the left arm   Vitamin D (Ergocalciferol) 1.25 MG (50000 UT) Caps capsule Commonly known as: DRISDOL TAKE 1 CAPSULE BY MOUTH 3 TIMES WEEKLY        ALLERGIES: Allergies  Allergen Reactions  . Thorazine  [Chlorpromazine] Anaphylaxis  . Ribociclib Other (See Comments)    Exfoliative dermatitis     REVIEW OF SYSTEMS: A comprehensive ROS was conducted with the patient and is negative except as per HPI and below:  Review of Systems  Constitutional: Positive for malaise/fatigue. Negative for fever.  HENT: Negative for congestion and sore throat.   Eyes: Negative for blurred vision and pain.  Respiratory: Negative for cough and shortness of breath.   Cardiovascular: Negative for chest pain and palpitations.  Gastrointestinal: Negative for diarrhea and nausea.  Genitourinary: Positive for urgency.  Skin: Negative.   Neurological: Positive for tingling. Negative for tremors.  Endo/Heme/Allergies: Positive for polydipsia.  Psychiatric/Behavioral: Positive for depression. The patient is not nervous/anxious.       OBJECTIVE:   VITAL SIGNS: BP 114/68 (BP Location: Right Arm, Patient Position: Sitting, Cuff Size: Normal)   Pulse (!) 122   Temp 98.7 F (37.1 C)   Ht 5\' 5"  (1.651 m)   Wt 167 lb 12.8 oz (76.1 kg)   SpO2 96%   BMI 27.92 kg/m    PHYSICAL EXAM:  General: Pt appears well and is in NAD  Hydration: Well-hydrated with moist mucous membranes and good skin turgor  HEENT:  Eyes: External eye exam normal without stare, lid lag or exophthalmos.  EOM intact.    Neck: General: Supple without adenopathy or carotid bruits. Thyroid: Thyroid size normal.  No goiter or nodules appreciated. No thyroid bruit.  Lungs: Clear with good BS bilat with no rales, rhonchi, or wheezes  Heart: RRR with normal S1 and S2 and no gallops; no murmurs; no rub  Abdomen: Normoactive bowel sounds, soft, nontender, without masses or organomegaly palpable  Extremities:  Lower extremities - No pretibial edema.  Skin: Normal texture and temperature to palpation. No rash noted. No Acanthosis nigricans/skin tags. No lipohypertrophy.  Neuro: MS is good with appropriate affect, pt is alert and Ox3    DM foot  exam: 07/20/19  The skin of the feet is intact without sores or ulcerations. The pedal pulses are 2+ on right and 2+ on left. The sensation is intact to a screening 5.07, 10 gram monofilament bilaterally   DATA REVIEWED:  Lab Results  Component Value Date   HGBA1C 8.9 (A) 07/20/2019   Lab Results  Component Value Date   CREATININE 0.71 07/12/2019      ASSESSMENT / PLAN / RECOMMENDATIONS:   1) Type 2 Diabetes Mellitus, Newly Diagnosed, With Neuropathic  complications - Most recent A1c of 8.9%. Goal A1c < 7.0 %.    Plan: GENERAL:  This is multifactorial given glucocorticoid use,  Stress, and prior chemo treatment  We did discuss increased carbohydrate sensitivity with steroid use.   Our treatment goal for  her is to avoid hypoglycemia and symptomatic hyperglycemia, we discussed insulin as an option to improve glucose control rapidly and the flexibility of adjustment based on steroid use, pt is depressed given her cancer diagnosis and opted for oral glycemic agents at this time, we agreed to give this a try  as I am not sure this will work given severe hyperglycemia.   She was encouraged to check glucose at home and bring her meter on next visit   Pt was encouraged to see an ophthalmologist   MEDICATIONS: - Continue Januvia , 1 tablet daily  - Start Glipizide 5 mg, 1 tablet before breakfast and Supper   EDUCATION / INSTRUCTIONS:  BG monitoring instructions: Patient is instructed to check her blood sugars 2 times a day, before breakfast and supper.  Call [facility] if: BG persistently < 70 or > 300. . I reviewed the Rule of 15 for the treatment of hypoglycemia in detail with the patient. Literature supplied.    F/u in 6 weeks      Signed electronically by: Mack Guise, MD  [facility] Endocrinology  [facility] [address] [place], [place] [address] Phone: [phone] FAX: [phone]   CC: [name], [place] [address] Phone: [phone]  Fax: [phone]    Return to Endocrinology clinic as below: Future Appointments  Date Time Provider [place]  07/25/2019  1:45 PM Freeman Caldron, PA-C [facility] None  07/27/2019  8:40 AM [name], MD [facility] None  08/31/2019  3:20 PM Shamleffer, Melanie Crazier, MD [facility] None

## 2019-07-23 ENCOUNTER — Encounter: Payer: Self-pay | Admitting: Internal Medicine

## 2019-07-23 ENCOUNTER — Telehealth: Payer: Self-pay | Admitting: *Deleted

## 2019-07-23 DIAGNOSIS — R739 Hyperglycemia, unspecified: Secondary | ICD-10-CM | POA: Insufficient documentation

## 2019-07-23 DIAGNOSIS — E1165 Type 2 diabetes mellitus with hyperglycemia: Secondary | ICD-10-CM | POA: Insufficient documentation

## 2019-07-23 NOTE — Telephone Encounter (Signed)
Patient called - left voice mail stating legs and feet are still tingling/numb. They are no worse, but not any better either. She wanted to know when it would be time to increase the Cymbalta. She has been on it a week.

## 2019-07-24 ENCOUNTER — Other Ambulatory Visit: Payer: Self-pay | Admitting: Hematology

## 2019-07-24 MED ORDER — DULOXETINE HCL 60 MG PO CPEP: 60.0000 mg | ORAL_CAPSULE | Freq: Every day | ORAL | 2 refills | Status: AC

## 2019-07-24 NOTE — Telephone Encounter (Signed)
Per Dr. Irene Limbo: Ask patient about symptoms - if it's too bothersome and she is tolerating the medication ok, she can proceed to Cymbalta 60 mg daily now.   Contacted patient - she states she is tolerating Cymbalta without side effects and will increase to 60 mg day now. She states she will need a new prescription soon since this one is just for  30 mg a day. Dr. Irene Limbo will be informed.

## 2019-07-25 ENCOUNTER — Ambulatory Visit
Admission: RE | Admit: 2019-07-25 | Discharge: 2019-07-25 | Disposition: A | Discharge status: Home / Self Care | Payer: BC Managed Care – PPO | Source: Ambulatory Visit | Attending: Urology | Admitting: Urology

## 2019-07-25 ENCOUNTER — Other Ambulatory Visit: Payer: Self-pay

## 2019-07-25 ENCOUNTER — Ambulatory Visit: Payer: BC Managed Care – PPO | Admitting: Urology

## 2019-07-25 DIAGNOSIS — C7951 Secondary malignant neoplasm of bone: Secondary | ICD-10-CM

## 2019-07-25 DIAGNOSIS — C7931 Secondary malignant neoplasm of brain: Secondary | ICD-10-CM

## 2019-07-25 NOTE — Progress Notes (Addendum)
Stacey Cox  Radiation Oncology         [phone] ________________________________  Name: Stacey Cox MRN: [id number]  Date: 07/25/2019  DOB: May 24, 1956  Post Treatment Note  CC: Stacey Ruddy, MD  Stacey Genera, MD  Diagnosis:   63 y.o. female with progressive dural metastasis secondary to metastatic breast cancer  Interval Since Last Radiation:  1 month  06/22/19:   PTV3- Lt Dura 60m treated to a prescription dose of 18 Gy in a single fraction   02/20/19, 02/23/19, 02/27/19, 03/02/19, 03/06/19:  PTV2- Dura treated to 25 Gy in 5 fractions  08/16/2018, 08/18/2018, 08/21/2018, 08/23/2018, 08/25/2018: Brain PTV1: Left anterior frontal lobe 460m// 25 Gy in 5 fractions  02/07/2017-02/18/2017:  The L-Spine was treated to 30 Gy in 10 fractions at 3 Gy per fraction.  Narrative:  I spoke with the patient to conduct her routine scheduled 1 month follow up visit via telephone to spare the patient unnecessary potential exposure in the healthcare setting during the current COVID-19 pandemic.  The patient was notified in advance and gave permission to proceed with this visit format.  She denies any ill side effects associated [facility]                              In summary, she is a [age].o. 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 CT and MRI imaging at the time of admission/workup showed an ill-defined sclerotic lesion at L2 without pathologic fracture, mildly sclerotic as well as a mildly expansile lesion at LEFT L3 vertebral body with suspected pathologic fracture. She had an MRI of brain on 01/22/17 which revealed an 11 mm extra-axial mass in the posterior fossa favoring a small meningioma over metastasis and a 6 mm nodule was also discovered in the prepontine cistern, possibly an additional  small meningioma. A large occipital lobe calvarial lesion was noted but no parenchymal disease.    She established care with Dr. KaIrene Limbon 02/01/17 and received Xgeva injections every 4 weeks in addition to [facility] BridgeShe received palliative radiation to her lumbar spine and sacrum from 02/07/2017 to 02/18/2017 and had dramatic improvement of her low back pain. She initially showed a good response to treatment until April 2019 CT showed a new enhancing lesion in the right hepatic lobe. CT C/A/P on 07/06/2018 showed no evidence of pulmonary metastasis or lymphadenopathy and stable dense sclerotic skeletal metastasis. There was a new pattern of heterogeneous enhancement and enhancing ill-defined lesions in the central left and right hepatic lobes. Prior enhancing lesion was highly concerning for progression of infiltrative malignancy in the liver occupying a large portion of the right hepatic lobe and central left hepatic lobe. No metastatic adenopathy in the abdomen/pelvis. Stable skeletal sclerotic metastasis. Her systemic therapy was changed to doxorubicin and cyclophosphamide every 3 weeks, beginning on 07/13/2018.  Verzenio and Femara were discontinued. A bipsy of the right lesion of the liver on 07/18/2018 revealed metastatic carcinoma, consistent with primary ER/PR positive breast carcinoma.  She presented to the emergency department on 07/20/2018 with two witnessed seizure episodes. She denied any neurologic symptoms prior to the seizures, and after a day in the hospital on Keppra was back to her baseline. Brain MRI at the time of admission demonstrated a plaque-like irregular  enhancing mass with broad base to the dura measuring 5.1 x 2.2 x 2.4 cm, likely originating from the dura with invasion of the underlying left frontal lobe with diffuse, smooth dural thickening over the left cerebral convexity which may represent associated edema and/or invasive neoplasm. Mass effect from the lesion and brain  edema partially effaces the frontal horn of left lateral ventricle resulting in 7 mm of left-to-right midline shift of the anterior septum pellucidum but without herniation. She was started on Decadron 4 mg daily. Repeat MRI of the brain on 07/28/2018 showed extensive nodular dural enhancement in the left frontal lobe with associated enhancing mass growing in the left frontal lobe, unchanged in size and compatible with metastatic disease associated with metastatic disease to the left frontal bone. Extensive edema in the left frontal lobe had progressed. There was blastic metastatic disease throughout the calvarium, as well as metastatic disease in the cervical spine. Enhancing mass along the floor of the posterior fossa on the right appeared stable and most consistent with meningioma. 6 mm enhancing mass in the prepontine cistern on the right was also unchanged and compatible with meningioma.  She elected to proceed with fractionated SRS treatment to the left frontal mass which was completed on 08/25/18 and tolerated very well.  She continued on systemic therapy with Doxorubicin and Cyclophosphamide under the care and direction of Dr. Irene Limbo- recently completed 6 cycles on 11/01/18 in addition to receiving Faslodex and Xgeva monthly.   She presented back to the ED on 01/27/19 with breakthrough seizure activity despite continuing on Keppra '500mg'$  po BID.  Her dose of Keppra was increased to '1000mg'$  po BID and she was started on Decadron with improvement in her symptoms.  She was discharged home on increased dose of Keppra in addition to Decadron '4mg'$  daily and did not have further seizure activity. CT head on admission was concerning for worsening intracranial metastasis with associated edema but a follow up MRI brain scan on 02/02/19 confirmed progressive dural metastases with pronounced edema but without evidence for any parenchymal brain metastases and stability of small meningiomas in the posterior fossa. This scan was  reviewed at multidisciplinary brain tumor board and recommendation was to proceed with a 5 fraction course of SRS to the progressive dural disease and she was in agreement.  This was completed on 03/06/19 and she tolerated treatment very well. She is currently taking 1 mg Dexamethasone daily at the recommendation of Dr. Mickeal Skinner. She has had some recent difficulty controlling her blood sugars and is now using insulin prn in addition to her other medications.  She has continued taking Keppra 1000 mg po BID and recently had breakthrough seizure activity in 04/2019 thought possibly secondary to delayed timing of taking her medication daily.  She met with Dr. Mickeal Skinner on 06/14/19 and she was advised to continue taking Keppra '1000mg'$  twice daily as well as the '1mg'$  of Dexamethasone daily.  Follow up MRI brain scan from 06/07/19 showed progression of dural based disease as evidenced by a new enhancing dural lesion measuring 22 x 26 mm,  along the inferolateral left temporal lobe with mild adjacent brain edema.  There was evidence of a positive response at the intervally treated diffuse dural metastatic disease elsewhere along the bilateral cerebral convexity and stability of the posterior fossa meningiomas with no evidence of primary parenchymal metastasis. She elected to proceed with SRS treatment of the single enlarging left dural based brain metastasis which was completed on 06/22/19 and tolerated well.  She has  continued Taxol systemic therapy under the care and direction of Dr. Irene Limbo due to evidence of disease progression in the liver on recent systemic imaging from 03/20/19.  She is tolerating the Taxol relatively well with the exception of neutropenia and neuropathy in the hands/feet.  Her most recent scheduled infusion on 07/12/19 was held due to the neutropenia and neuropathy. She continues in close follow up with Dr. Irene Limbo with her next visit scheduled 07/27/19 to discuss resuming treatment.  She does not currently have any  scheduled follow up with Dr. Mickeal Skinner.  On review of systems, the patient states that she is doing very well overall.  She denies headaches, dizziness, imbalance, difficulty with speech or further seizure activity/staring episodes and no further garbled speech and no tremor.  She has continued with moderte residual fatigue but continues to work to get back into a more normal circadian rhythm and feels that she is making slow but gradual progress as far as her sleeping schedule goes.  She confirms that she has continued taking Keppra 1000 mg p.o. twice daily as prescribed in addition to Dexamethasone 1 mg daily.  ALLERGIES:  is allergic to thorazine [chlorpromazine] and ribociclib.  Meds: Current Outpatient Medications  Medication Sig Dispense Refill   aspirin EC 81 MG tablet Take 1 tablet (81 mg total) by mouth daily.     chlorhexidine (PERIDEX) 0.12 % solution Use as directed 15 mLs in the mouth or throat 2 (two) times daily as needed (mouth rinse after vomiting). Swish and spit (Patient not taking: Reported on 07/20/2019) 473 mL 1   dexamethasone (DECADRON) 1 MG tablet TAKE 1 TABLET(1 MG) BY MOUTH DAILY 30 tablet 2   DULoxetine (CYMBALTA) 60 MG capsule Take 1 capsule (60 mg total) by mouth daily. 30 capsule 2   feeding supplement, ENSURE ENLIVE, (ENSURE ENLIVE) LIQD Take 237 mLs by mouth 2 (two) times daily between meals. 237 mL 12   glipiZIDE (GLUCOTROL) 5 MG tablet Take 1 tablet (5 mg total) by mouth 2 (two) times daily before a meal. 60 tablet 3   hydrOXYzine (ATARAX/VISTARIL) 10 MG tablet Take 10 mg by mouth 2 (two) times daily.     JANUVIA 50 MG tablet TAKE 1 TABLET(50 MG) BY MOUTH DAILY 30 tablet 0   levETIRAcetam (KEPPRA) 1000 MG tablet Take 1 tablet (1,000 mg total) by mouth 2 (two) times daily. 60 tablet 5   pantoprazole (PROTONIX) 40 MG tablet TAKE 1 TABLET(40 MG) BY MOUTH DAILY (Patient not taking: Reported on 07/20/2019) 30 tablet 1   triamcinolone (KENALOG) 0.025 % ointment  Apply 1 application topically 2 (two) times daily. Please in thin layer gently (without rubbing) over eczematoid rash over the left arm (Patient not taking: Reported on 07/20/2019) 30 g 0   Vitamin D, Ergocalciferol, (DRISDOL) 1.25 MG (50000 UT) CAPS capsule TAKE 1 CAPSULE BY MOUTH 3 TIMES WEEKLY (Patient not taking: Reported on 06/14/2019) 24 capsule 0   No current facility-administered medications for this encounter.    Facility-Administered Medications Ordered in Other Encounters  Medication Dose Route Frequency Provider Last Rate Last Dose   denosumab (XGEVA) injection 120 mg  120 mg Subcutaneous Once Stacey Genera, MD       fulvestrant (FASLODEX) injection 500 mg  500 mg Intramuscular Once Stacey Genera, MD        Physical Findings:  vitals were not taken for this visit.  Unable to assess due to telephone visit format.  Lab Findings: Lab Results  Component Value Date  WBC 7.9 07/12/2019   HGB 12.6 07/12/2019   HCT 38.6 07/12/2019   MCV 95.1 07/12/2019   PLT 159 07/12/2019     Radiographic Findings: Ct Chest W Contrast  Result Date: 07/19/2019 CLINICAL DATA:  Restaging of metastatic breast cancer EXAM: CT CHEST, ABDOMEN, AND PELVIS WITH CONTRAST TECHNIQUE: Multidetector CT imaging of the chest, abdomen and pelvis was performed following the standard protocol during bolus administration of intravenous contrast. CONTRAST:  159m OMNIPAQUE IOHEXOL 300 MG/ML  SOLN COMPARISON:  Multiple exams, including 03/20/2019 FINDINGS: CT CHEST FINDINGS Cardiovascular: Right Port-A-Cath tip: Lower SVC. Mild atherosclerotic calcification of the thoracic aorta and probably of the left anterior descending coronary artery. Mediastinum/Nodes: Stable 1.4 by 0.7 cm hypodense right thyroid nodule. No pathologic adenopathy. Lungs/Pleura: Mild lingular scarring. Musculoskeletal: Widespread osseous metastatic disease is present with some areas of increasing sclerosis, for example in the T8  vertebral body on image 93/6. CT ABDOMEN PELVIS FINDINGS Hepatobiliary: Peripheral scarring, distortion of the liver, underlying cirrhosis, and scattered small hypodense lesions in the liver. In general there seems to be mild improvement in the pattern lesions. For example, a previous posterior right hepatic lobe lesion measuring 4.9 by 2.4 cm currently demonstrates increased hypodensity and measures 4.2 by 1.5 cm on image 52/2. Cholecystectomy noted. Pancreas: Unremarkable Spleen: Stable cystic lesion in accessory spleen just below the main portion of the spleen. Adrenals/Urinary Tract: Unremarkable Stomach/Bowel: Sigmoid colon diverticulosis. Vascular/Lymphatic: Aortoiliac atherosclerotic vascular disease. Reproductive: Unremarkable Other: No supplemental non-categorized findings. Musculoskeletal: Mild increase in sclerosis of some of the pre-existing scattered osseous metastatic lesions. IMPRESSION: 1. Reduction in size and degree of enhancement in the liver lesions, most notably in the lesion posteriorly in the right hepatic lobe. Irregular hepatic morphology likely due to a combination of tumor and cirrhosis. 2. Mild increase in sclerosis of some of the pre-existing bony lesions. Given the improvement in the liver lesions, this may represent a positive response to therapy rather than a worsening. 3. Other imaging findings of potential clinical significance: Aortic Atherosclerosis (ICD10-I70.0). Sigmoid colon diverticulosis. Electronically Signed   By: WVan ClinesM.D.   On: 07/19/2019 17:09   Nm Bone Scan Whole Body  Result Date: 07/19/2019 CLINICAL DATA:  Metastatic breast cancer to brain and bone, denies bone pain EXAM: NUCLEAR MEDICINE WHOLE BODY BONE SCAN TECHNIQUE: Whole body anterior and posterior images were obtained approximately 3 hours after intravenous injection of radiopharmaceutical. RADIOPHARMACEUTICALS:  20.5 mCi Technetium-914mDP IV COMPARISON:  01/21/2017 Correlation: CT chest  abdomen pelvis 07/19/2019 FINDINGS: Multiple sites of abnormal osseous tracer accumulation consistent with osseous metastatic disease. These include calvaria, RIGHT clavicle, BILATERAL ribs, thoracic and lumbar spine, pelvis, and proximal femora. When compared to previous exam, progressive osseous metastatic disease is identified. Mildly decreased tracer accumulation at the anterior RIGHT ribs, could reflect prior radiation therapy port. Expected urinary tract and soft tissue distribution of tracer. IMPRESSION: Progressive osseous metastatic disease. Electronically Signed   By: MaLavonia Dana.D.   On: 07/19/2019 16:40   Ct Abdomen Pelvis W Contrast  Result Date: 07/19/2019 CLINICAL DATA:  Restaging of metastatic breast cancer EXAM: CT CHEST, ABDOMEN, AND PELVIS WITH CONTRAST TECHNIQUE: Multidetector CT imaging of the chest, abdomen and pelvis was performed following the standard protocol during bolus administration of intravenous contrast. CONTRAST:  10074mMNIPAQUE IOHEXOL 300 MG/ML  SOLN COMPARISON:  Multiple exams, including 03/20/2019 FINDINGS: CT CHEST FINDINGS Cardiovascular: Right Port-A-Cath tip: Lower SVC. Mild atherosclerotic calcification of the thoracic aorta and probably of the left anterior descending coronary  artery. Mediastinum/Nodes: Stable 1.4 by 0.7 cm hypodense right thyroid nodule. No pathologic adenopathy. Lungs/Pleura: Mild lingular scarring. Musculoskeletal: Widespread osseous metastatic disease is present with some areas of increasing sclerosis, for example in the T8 vertebral body on image 93/6. CT ABDOMEN PELVIS FINDINGS Hepatobiliary: Peripheral scarring, distortion of the liver, underlying cirrhosis, and scattered small hypodense lesions in the liver. In general there seems to be mild improvement in the pattern lesions. For example, a previous posterior right hepatic lobe lesion measuring 4.9 by 2.4 cm currently demonstrates increased hypodensity and measures 4.2 by 1.5 cm on image  52/2. Cholecystectomy noted. Pancreas: Unremarkable Spleen: Stable cystic lesion in accessory spleen just below the main portion of the spleen. Adrenals/Urinary Tract: Unremarkable Stomach/Bowel: Sigmoid colon diverticulosis. Vascular/Lymphatic: Aortoiliac atherosclerotic vascular disease. Reproductive: Unremarkable Other: No supplemental non-categorized findings. Musculoskeletal: Mild increase in sclerosis of some of the pre-existing scattered osseous metastatic lesions. IMPRESSION: 1. Reduction in size and degree of enhancement in the liver lesions, most notably in the lesion posteriorly in the right hepatic lobe. Irregular hepatic morphology likely due to a combination of tumor and cirrhosis. 2. Mild increase in sclerosis of some of the pre-existing bony lesions. Given the improvement in the liver lesions, this may represent a positive response to therapy rather than a worsening. 3. Other imaging findings of potential clinical significance: Aortic Atherosclerosis (ICD10-I70.0). Sigmoid colon diverticulosis. Electronically Signed   By: Van Clines M.D.   On: 07/19/2019 17:09    Impression/Plan: 1. 63 y.o. female with progressive dural disease secondary to metastatic breast cancer. She appears to have recovered well from her recent [facility] radiotherapy and is currently without complaints.  We discussed the plan to repeat an MRI brain scan in approximately 2 months time and will review these images at our upcoming brain tumor board prior to a follow-up visit with Dr. Mickeal Skinner or myself to review results and recommendations at that time.  Pending her disease appears stable on this scan, the plan will be to continue with serial MRI brain scans every 3 months with a follow-up thereafter to review results.  She will also continue in routine follow-up under the care and direction of Dr. Irene Limbo for continued management of her systemic disease.  She appears to have a good understanding of her disease and our  recommendations and is comfortable and in agreement with the stated plan.  She knows to call at anytime with any questions or concerns in the interim.  Given current concerns for patient exposure during the COVID-19 pandemic, this encounter was conducted via telephone. The patient has given verbal consent for this type of encounter. I spent 25 minutes in telephone conversation with the patient and more than 50% of that time was spent in counseling and/or coordination of care.  The attendants for this meeting include Daisha Filosa PA-C, and patient, Stacey Cox. During the encounter, Jodell Weitman PA-C, was located at [facility] Radiation Oncology Department.  Patient, Stacey Cox was located at home.    Nicholos Johns, PA-C

## 2019-07-26 ENCOUNTER — Telehealth: Payer: Self-pay | Admitting: Radiation Oncology

## 2019-07-26 NOTE — Addendum Note (Signed)
Encounter addended by: Freeman Caldron, PA-C on: 07/26/2019 2:30 PM  Actions taken: Clinical Note Signed

## 2019-07-26 NOTE — Telephone Encounter (Signed)
Received a voicemail message from patient's sister, Henrene Hawking, requesting a return call from [employer], PA-C. Phoned Claiborne Billings to inquire. Claiborne Billings questions what was discussed during her sister's follow up appointment yesterday with Ashlyn. Explained yesterday's visit was to ensure resolution of radiation side effects associated with treatment but there was no imaging to review. Explained that Ms. Firpo denied any neurologic symptoms. Explained she reported moderate fatigue but an improved sleep pattern. Explained a brain MRI will be repeated in two months to determine Ms. Nolt's response to her most recent treatment then every three months thereafter. Claiborne Billings requested the physician note be updated to relief that the patient is taking Keppra 1000 mg bid not 1500. Also, she request direction about decadron. Claiborne Billings confirms her sister continues to take decadron 1 mg once per day. Claiborne Billings understands this RN will inform Ashlyn of her request and lean on Dr. Renda Rolls nurse for decadron guidance. Claiborne Billings verbalized understanding of all reviewed and expressed appreciation for the return call.

## 2019-07-26 NOTE — Progress Notes (Signed)
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   INTERVAL HISTORY   I connected with Stacey Cox on 07/26/19 at  8:40 AM EDT by telephone visit and verified that I am speaking with the correct person using two identifiers.   I discussed the limitations, risks, security and privacy concerns of performing an evaluation and management service by telemedicine and the availability of in-person appointments. I also discussed with the patient that there may be a patient responsible charge related to this service. The patient expressed understanding and agreed to proceed.   Other persons participating in the visit and their role in the encounter: sister and friend.   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 The patient's last visit with [place] was on 07/12/2019. The pt reports that she is doing well overall.  The pt reports grade 1-2 neuropathy and grade 2 fatigue.  Of note since the patient's last visit, pt has had a chest, abdomen, and pelvis CT with contrast completed on 07/19/2019 with results revealing "Reduction in size and degree of enhancement in the liver lesions, most notably in the lesion posteriorly in the right hepatic lobe. Irregular hepatic morphology likely due to a combination of tumor and cirrhosis. Mild increase in sclerosis of some of the pre-existing bony lesions. Given the improvement in the liver lesions, this may represent a positive response to therapy rather than a worsening. Other imaging findings of potential clinical  significance: Aortic Atherosclerosis (ICD10-I70.0). Sigmoid colon diverticulosis."   Pt also had a whole body bone scan completed on 07/19/2019 with results revealing "Progressive osseous metastatic disease."   Lab results today (07/12/2019) of CBC w/diff and CMP is as follows: all values are WNL except for RDW at 16.6, Glucose at 217, Total Protein at 6.4, Albumin at 3.3, Ast at 67, ALT at 97, Alkaline Phosphatase at 311. .  On review of systems, pt reports no fevers, chills, nightsweats and denies headaches or any SZ and any other symptoms.     MEDICAL HISTORY:  Past Medical History:  Diagnosis Date   Cancer [facility])    Metastatic Breast Cancer    SURGICAL HISTORY: Past Surgical History:  Procedure Laterality Date   CHOLECYSTECTOMY     IR IMAGING GUIDED PORT INSERTION  07/14/2018   TONSILLECTOMY      SOCIAL HISTORY: Social History   Socioeconomic History   Marital status: Single    Spouse name: Not on file   Number of children: Not on file   Years of education: Not on file   Highest education level: Not on file  Occupational History   Not on file  Social Needs   Financial resource strain: Not on file   Food insecurity    Worry: Not on file    Inability: Not on file   Transportation needs    Medical: Not on file    Non-medical: Not on file  Tobacco Use   Smoking status: Current Every Day Smoker    Packs/day: 0.15    Types: Cigarettes    Last attempt to quit: 01/21/2017  Years since quitting: 2.5   Smokeless tobacco: Never Used  Substance and Sexual Activity   Alcohol use: No    Alcohol/week: 0.0 standard drinks   Drug use: No   Sexual activity: Never  Lifestyle   Physical activity    Days per week: Not on file    Minutes per session: Not on file   Stress: Not on file  Relationships   Social connections    Talks on phone: Not on file    Gets together: Not on file    Attends religious service: Not on file    Active member of club or  organization: Not on file    Attends meetings of clubs or organizations: Not on file    Relationship status: Not on file   Intimate partner violence    Fear of current or ex partner: No    Emotionally abused: No    Physically abused: No    Forced sexual activity: No  Other Topics Concern   Not on file  Social History Narrative   Lives alone. 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 Has phobia of the medical system.  Cigarette smoker 1/2 PPD for about 40 yrs Social alcohol use No drugs Professor at [employer] no children  FAMILY HISTORY: Family History  Problem Relation Age of Onset   Cancer Mother        breast   Cancer Sister        breast  Mother with h/o breast cancer in her 43's Sister with breast cancer in her 52ys and later was diagnosed with multiple myeloma. (Patient is not aware of any specific breast cancer mutations present)  ALLERGIES:  is allergic to thorazine [chlorpromazine] and ribociclib.  MEDICATIONS:  Current Outpatient Medications  Medication Sig Dispense Refill   aspirin EC 81 MG tablet Take 1 tablet (81 mg total) by mouth daily.     chlorhexidine (PERIDEX) 0.12 % solution Use as directed 15 mLs in the mouth or throat 2 (two) times daily as needed (mouth rinse after vomiting). Swish and spit (Patient not taking: Reported on 07/20/2019) 473 mL 1   dexamethasone (DECADRON) 1 MG tablet TAKE 1 TABLET(1 MG) BY MOUTH DAILY 30 tablet 2   DULoxetine (CYMBALTA) 60 MG capsule Take 1 capsule (60 mg total) by mouth daily. 30 capsule 2   feeding supplement, ENSURE ENLIVE, (ENSURE ENLIVE) LIQD Take 237 mLs by mouth 2 (two) times daily between meals. 237 mL 12   glipiZIDE (GLUCOTROL) 5 MG tablet Take 1 tablet (5 mg total) by mouth 2 (two) times daily before a meal. 60 tablet 3   hydrOXYzine (ATARAX/VISTARIL) 10 MG tablet Take 10 mg by mouth 2 (two) times daily.     JANUVIA 50 MG tablet TAKE 1 TABLET(50 MG) BY MOUTH DAILY 30 tablet 0   levETIRAcetam (KEPPRA) 1000  MG tablet Take 1 tablet (1,000 mg total) by mouth 2 (two) times daily. 60 tablet 5   pantoprazole (PROTONIX) 40 MG tablet TAKE 1 TABLET(40 MG) BY MOUTH DAILY (Patient not taking: Reported on 07/20/2019) 30 tablet 1   triamcinolone (KENALOG) 0.025 % ointment Apply 1 application topically 2 (two) times daily. Please in thin layer gently (without rubbing) over eczematoid rash over the left arm (Patient not taking: Reported on 07/20/2019) 30 g 0   Vitamin D, Ergocalciferol, (DRISDOL) 1.25 MG (50000 UT) CAPS capsule TAKE 1 CAPSULE BY MOUTH 3 TIMES WEEKLY (Patient not taking: Reported on 06/14/2019) 24 capsule 0   No current facility-administered medications for this  visit.    Facility-Administered Medications Ordered in Other Visits  Medication Dose Route Frequency Provider Last Rate Last Dose   denosumab (XGEVA) injection 120 mg  120 mg Subcutaneous Once [name], MD       fulvestrant (FASLODEX) injection 500 mg  500 mg Intramuscular Once [name], MD        REVIEW OF SYSTEMS:   A 10+ POINT REVIEW OF SYSTEMS WAS OBTAINED including neurology, dermatology, psychiatry, cardiac, respiratory, lymph, extremities, GI, GU, Musculoskeletal, constitutional, breasts, reproductive, HEENT.  All pertinent positives are noted in the HPI.  All others are negative.    PHYSICAL EXAMINATION:  ECOG PERFORMANCE STATUS: 1 - Symptomatic but completely ambulatory  There were no vitals filed for this visit. There were no vitals filed for this visit. There is no height or weight on file to calculate BMI.   Telehealth Visit     LABORATORY DATA:  I have reviewed the data as listed  CBC Latest Ref Rng & Units 07/12/2019 06/28/2019 06/14/2019  WBC 4.0 - 10.5 K/uL 7.9 16.2(H) 3.8(L)  Hemoglobin 12.0 - 15.0 g/dL 12.6 12.9 11.6(L)  Hematocrit 36.0 - 46.0 % 38.6 39.9 36.0  Platelets 150 - 400 K/uL 159 155 153   CBC    Component Value Date/Time   WBC 7.9 07/12/2019 1122   RBC 4.06 07/12/2019  1122   HGB 12.6 07/12/2019 1122   HGB 12.7 04/12/2019 0901   HGB 13.9 11/24/2017 1340   HCT 38.6 07/12/2019 1122   HCT 42.0 11/24/2017 1340   PLT 159 07/12/2019 1122   PLT 198 04/12/2019 0901   PLT 212 11/24/2017 1340   MCV 95.1 07/12/2019 1122   MCV 97.0 11/24/2017 1340   MCH 31.0 07/12/2019 1122   MCHC 32.6 07/12/2019 1122   RDW 16.6 (H) 07/12/2019 1122   RDW 14.1 11/24/2017 1340   LYMPHSABS 0.7 07/12/2019 1122   LYMPHSABS 1.4 11/24/2017 1340   MONOABS 0.9 07/12/2019 1122   MONOABS 0.3 11/24/2017 1340   EOSABS 0.1 07/12/2019 1122   EOSABS 0.1 11/24/2017 1340   BASOSABS 0.1 07/12/2019 1122   BASOSABS 0.1 11/24/2017 1340    . CMP Latest Ref Rng & Units 07/12/2019 06/28/2019 06/14/2019  Glucose 70 - 99 mg/dL 217(H) 230(H) 395(H)  BUN 8 - 23 mg/dL _0 Creatinine 0.44 - 1.00 mg/dL 0.71 0.75 0.82  Sodium 135 - 145 mmol/L 137 137 136  Potassium 3.5 - 5.1 mmol/L 3.6 3.8 3.7  Chloride 98 - 111 mmol/L 103 100 99  CO2 22 - 32 mmol/L _1 Calcium 8.9 - 10.3 mg/dL 9.1 9.6 9.4  Total Protein 6.5 - 8.1 g/dL 6.4(L) 6.4(L) 6.2(L)  Total Bilirubin 0.3 - 1.2 mg/dL 0.5 0.6 0.5  Alkaline Phos 38 - 126 U/L 311(H) 297(H) 250(H)  AST 15 - 41 U/L 67(H) 36 50(H)  ALT 0 - 44 U/L 97(H) 72(H) 84(H)   07/18/18 Pathology:          RADIOGRAPHIC STUDIES: I have personally reviewed the radiological images as listed and agreed with the findings in the report. Ct Chest W Contrast  Result Date: 07/19/2019 CLINICAL DATA:  Restaging of metastatic breast cancer EXAM: CT CHEST, ABDOMEN, AND PELVIS WITH CONTRAST TECHNIQUE: Multidetector CT imaging of the chest, abdomen and pelvis was performed following the standard protocol during bolus administration of intravenous contrast. CONTRAST:  127m OMNIPAQUE IOHEXOL 300 MG/ML  SOLN COMPARISON:  Multiple exams, including 03/20/2019 FINDINGS: CT CHEST FINDINGS Cardiovascular: Right Port-A-Cath  tip: Lower SVC. Mild atherosclerotic calcification of the  thoracic aorta and probably of the left anterior descending coronary artery. Mediastinum/Nodes: Stable 1.4 by 0.7 cm hypodense right thyroid nodule. No pathologic adenopathy. Lungs/Pleura: Mild lingular scarring. Musculoskeletal: Widespread osseous metastatic disease is present with some areas of increasing sclerosis, for example in the T8 vertebral body on image 93/6. CT ABDOMEN PELVIS FINDINGS Hepatobiliary: Peripheral scarring, distortion of the liver, underlying cirrhosis, and scattered small hypodense lesions in the liver. In general there seems to be mild improvement in the pattern lesions. For example, a previous posterior right hepatic lobe lesion measuring 4.9 by 2.4 cm currently demonstrates increased hypodensity and measures 4.2 by 1.5 cm on image 52/2. Cholecystectomy noted. Pancreas: Unremarkable Spleen: Stable cystic lesion in accessory spleen just below the main portion of the spleen. Adrenals/Urinary Tract: Unremarkable Stomach/Bowel: Sigmoid colon diverticulosis. Vascular/Lymphatic: Aortoiliac atherosclerotic vascular disease. Reproductive: Unremarkable Other: No supplemental non-categorized findings. Musculoskeletal: Mild increase in sclerosis of some of the pre-existing scattered osseous metastatic lesions. IMPRESSION: 1. Reduction in size and degree of enhancement in the liver lesions, most notably in the lesion posteriorly in the right hepatic lobe. Irregular hepatic morphology likely due to a combination of tumor and cirrhosis. 2. Mild increase in sclerosis of some of the pre-existing bony lesions. Given the improvement in the liver lesions, this may represent a positive response to therapy rather than a worsening. 3. Other imaging findings of potential clinical significance: Aortic Atherosclerosis (ICD10-I70.0). Sigmoid colon diverticulosis. Electronically Signed   By: Van Clines M.D.   On: 07/19/2019 17:09   Nm Bone Scan Whole Body  Result Date: 07/19/2019 CLINICAL DATA:   Metastatic breast cancer to brain and bone, denies bone pain EXAM: NUCLEAR MEDICINE WHOLE BODY BONE SCAN TECHNIQUE: Whole body anterior and posterior images were obtained approximately 3 hours after intravenous injection of radiopharmaceutical. RADIOPHARMACEUTICALS:  20.5 mCi Technetium-81mMDP IV COMPARISON:  01/21/2017 Correlation: CT chest abdomen pelvis 07/19/2019 FINDINGS: Multiple sites of abnormal osseous tracer accumulation consistent with osseous metastatic disease. These include calvaria, RIGHT clavicle, BILATERAL ribs, thoracic and lumbar spine, pelvis, and proximal femora. When compared to previous exam, progressive osseous metastatic disease is identified. Mildly decreased tracer accumulation at the anterior RIGHT ribs, could reflect prior radiation therapy port. Expected urinary tract and soft tissue distribution of tracer. IMPRESSION: Progressive osseous metastatic disease. Electronically Signed   By: MLavonia DanaM.D.   On: 07/19/2019 16:40   Ct Abdomen Pelvis W Contrast  Result Date: 07/19/2019 CLINICAL DATA:  Restaging of metastatic breast cancer EXAM: CT CHEST, ABDOMEN, AND PELVIS WITH CONTRAST TECHNIQUE: Multidetector CT imaging of the chest, abdomen and pelvis was performed following the standard protocol during bolus administration of intravenous contrast. CONTRAST:  1035mOMNIPAQUE IOHEXOL 300 MG/ML  SOLN COMPARISON:  Multiple exams, including 03/20/2019 FINDINGS: CT CHEST FINDINGS Cardiovascular: Right Port-A-Cath tip: Lower SVC. Mild atherosclerotic calcification of the thoracic aorta and probably of the left anterior descending coronary artery. Mediastinum/Nodes: Stable 1.4 by 0.7 cm hypodense right thyroid nodule. No pathologic adenopathy. Lungs/Pleura: Mild lingular scarring. Musculoskeletal: Widespread osseous metastatic disease is present with some areas of increasing sclerosis, for example in the T8 vertebral body on image 93/6. CT ABDOMEN PELVIS FINDINGS Hepatobiliary: Peripheral  scarring, distortion of the liver, underlying cirrhosis, and scattered small hypodense lesions in the liver. In general there seems to be mild improvement in the pattern lesions. For example, a previous posterior right hepatic lobe lesion measuring 4.9 by 2.4 cm currently demonstrates increased hypodensity and measures 4.2 by  1.5 cm on image 52/2. Cholecystectomy noted. Pancreas: Unremarkable Spleen: Stable cystic lesion in accessory spleen just below the main portion of the spleen. Adrenals/Urinary Tract: Unremarkable Stomach/Bowel: Sigmoid colon diverticulosis. Vascular/Lymphatic: Aortoiliac atherosclerotic vascular disease. Reproductive: Unremarkable Other: No supplemental non-categorized findings. Musculoskeletal: Mild increase in sclerosis of some of the pre-existing scattered osseous metastatic lesions. IMPRESSION: 1. Reduction in size and degree of enhancement in the liver lesions, most notably in the lesion posteriorly in the right hepatic lobe. Irregular hepatic morphology likely due to a combination of tumor and cirrhosis. 2. Mild increase in sclerosis of some of the pre-existing bony lesions. Given the improvement in the liver lesions, this may represent a positive response to therapy rather than a worsening. 3. Other imaging findings of potential clinical significance: Aortic Atherosclerosis (ICD10-I70.0). Sigmoid colon diverticulosis. Electronically Signed   By: Van Clines M.D.   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 Multifocal tumor in the left breast with biopsy-proven left axillary metastases.   Noted to have extensive bone metastases and pulmonary metastases. Patient was noted to have calvarial metastases but no overt parenchymal metastasis 09/14/17 CT Chest/ad/pelvis Results discussed in details - good response.  02/22/18 CT revealed  Newly  apparent 2.1 by 2.0 cm rim enhancing lesion in the right hepatic lobe suspicious for a metastatic lesion. This was not apparent on prior exams although the prior exams were all noncontrast and thus the lesion may have been present but with reduced conspicuity. There is also a small enhancing lesion further posteriorly in the right hepatic lobe which is technically nonspecific and could be a small hemangioma or a small metastatic lesion. Stable distribution and appearance of prior sclerotic osseous metastatic disease without bony progression. 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   07/06/18 CT C/A/P which revealed New pattern of heterogeneous enhancement and enhancing ill-defined lesions in the central LEFT and RIGHT hepatic lobe at site prior enhancing lesion is highly concerning for progression of of infiltrative malignancy in the liver occupying a large portion of the RIGHT hepatic lobe and central LEFT hepatic lobe. 2. Small amount of free fluid in the abdomen pelvis. 3. No metastatic adenopathy.4. No evidence pulmonary metastasis or lymphadenopathy. 5. Stable dense sclerotic skeletal metastasis   07/11/18 ECHO revealed LV EF of 55%-60%   07/18/18 Liver needle/core biopsy right lesion consi with Ki67 at 40%, ER/PR +ve   07/28/18 MRI Abdomen MRCP revealed Extensive hepatic metastatic disease, possibly progressive from recent abdominal CT. 2. No evidence of intrahepatic or extrahepatic biliary dilatation. Elevated liver function studies may be secondary to intrahepatic cholestasis. 3. Increased ascites without peritoneal nodularity or suspicious enhancement. 4. Grossly stable widespread osseous metastatic disease.   S/p 6 cycles of AC completed on 11/01/18  03/20/19 CT C/A/P revealed "Although the dominant liver lesion appears improved in the interval, there appears to be 2 new liver metastases on today's exam, concerning for  disease progression. 2. Similar appearance of the widespread sclerotic bone metastases. 3. Interval decrease in ascites. 4. Aortic Atherosclerois."  Pt's planned third weekly infusion of Taxol was cancelled due to dental abscess s/p antibiotic course. Then cancelled again due to thrombocytopenia thought to be due to antibiotic course.  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 Much improved back pain.  #  3 Neutropenia Related to her Ribociclib - resolved. Patient is currently on Verzenio and has not developed any neutropenia. This is being monitored.  #4 Increased LFTs due to new liver metastases  #5 Cancer related pain - controlled - status post palliative RT to spine - no currently needing any pain medications.  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 No other new medication. Now resolved. Monitoring on increasing doses of Verzenio. No issues with recurrent rash on Verzenio 155m po BID  #7 Brain metastasis  07/20/18 CT Head revealed 1.8 x 2.4 x 3.9 cm LEFT frontal cortical based mass with possible dural component highly concerning for metastatic disease, less likely abscess or cerebritis. Severe vasogenic edema with 5 mm LEFT to RIGHT subfalcine herniation. No ventricular entrapment. 2. 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 3. Multiple calvarial metastasis. 4. Cerebellar atrophy.    07/21/18 MRI Brain revealed  Irregular plaque-like mass, likely metastasis, measuring up to 5.1 cm, appears to originate from the left anterolateral frontal dura with invasion of the underlying frontal lobe of the brain. Mass effect from brain edema and the lesion partially effaces the frontal horn of left lateral ventricle and results in 7 mm left-to-right midline shift of anterior septum pellucidum. 2. Diffuse dural thickening over the left cerebral convexity may represent edema associated with the tumor or invasive neoplasm. 3.  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   07/28/18 MRI Brain revealed Extensive nodular dural enhancement left frontal lobe with associated enhancing mass growing in the left frontal lobe is unchanged in size and compatible with metastatic disease. This is associated with metastatic disease to the left frontal bone. Extensive edema in the left frontal lobe has progressed in the interval. Blastic metastatic disease throughout the calvarium. Metastatic disease in the cervical spine. Enhancing mass along the floor of the posterior fossa on the right is stable and most consistent with meningioma. 6 mm enhancing mass in the prepontine cistern on the right also unchanged and compatible with meningioma.   s/p SRS radiation therapy  10/23/18 MRI Brain revealed SRS protocol demonstrating treatment response: Regression of LEFT frontal dural metastasis, decreased vasogenic edema and parenchymal invasion. Similar calvarial metastasis. 2. Stable appearance of 2 small posterior fossa meningiomas. 3. No acute intracranial process.   02/02/19 MRI Brain revealed "Marked progression of dural disease left greater than right as described above. Pronounced brain edema, left more than right with left-to-right shift of 1 cm. 2. I do not identify any brain parenchymal metastases. 3. Small stable incidental meningiomas in the posterior fossa as above."  Pt is s/p SRS for dural metastases.  06/07/2019 MRI brain w and wo contrast revealed "1. Progression of disease disease as evidenced by new dural metastasis along the inferolateral left temporal lobe with mild adjacent brain edema, see image 13:43. 2. Positive response at the intervally treated diffuse dural metastatic disease elsewhere along the bilateral cerebral convexity. 3. Posterior fossa meningiomas are stable. 4. No primary parenchymal metastasis."  PLAN:  -Continue follow up with Dr. VMickeal Skinnerin Neuro-oncology on 02/06/2019 and  Dr. MTammi Klippelin ROccidentalwith XDelton Seeevery 4 weeks   #8 Vitamin D deficiency Vit D levels improved to 44.8  On 12/01/2018 -continue Vit D replacement.  #9 h/o Left upper extremity swelling- [place] for DVT - now swelling resolved.   PLAN:  -Discussed pt labwork today, 07/12/2019; all values are WNL except for  RDW at 16.6, Glucose at 217, Total Protein at 6.4, Albumin at 3.3, Ast at 67, ALT at 97, Alkaline Phosphatase at 311. . -Discussed 07/19/2019 C/A/P CT with results revealing "Reduction in size and degree of enhancement in the liver lesions, most notably in the lesion posteriorly in the right hepatic lobe. Irregular hepatic morphology likely due to a combination of tumor and cirrhosis. Mild increase in sclerosis of some of the pre-existing bony lesions. Given the improvement in the liver lesions, this may represent a positive response to therapy rather than a worsening. Other imaging findings of potential clinical significance: Aortic Atherosclerosis (ICD10-I70.0). Sigmoid colon diverticulosis."  -Discussed 07/19/2019 whole body bone scan with results revealing "Progressive osseous metastatic disease.- compared to initial bone scan but not necessarily recently" -continue Xgeva and faslodex -increased Cymbalta to 5m to help with neuropathy -will hold next line of chemotherapy to allow improvement in patients functional status -continue Home care services and therapies.  FOLLOW UP: -please schedule next 4 doses of monthly Xgeva and faslodex -labs in 3 weeks -phone visit in 4 weeks   The total time spent in the appt was 25 minutes and more than 50% was on counseling and direct patient cares.  All of the patient's questions were answered with apparent satisfaction. The patient knows to call the clinic with any problems, questions or concerns.    GSullivan LoneMD MS AAHIVMS SInland Valley Surgical Partners LLCCKindred Hospital Pittsburgh North ShoreHematology/Oncology Physician CAdventhealth [facility] (Office):       [phone](Work  cell):  [phone](Fax):           [phone] I, AJacqualyn Posey am acting as a scribe for Dr. GSullivan Lone   .I have reviewed the above documentation for accuracy and completeness, and I agree with the above.

## 2019-07-27 ENCOUNTER — Inpatient Hospital Stay: Payer: BC Managed Care – PPO | Attending: Hematology | Admitting: Hematology

## 2019-07-27 ENCOUNTER — Telehealth: Payer: Self-pay | Admitting: Hematology

## 2019-07-27 DIAGNOSIS — C7931 Secondary malignant neoplasm of brain: Secondary | ICD-10-CM | POA: Diagnosis not present

## 2019-07-27 DIAGNOSIS — C787 Secondary malignant neoplasm of liver and intrahepatic bile duct: Secondary | ICD-10-CM

## 2019-07-27 DIAGNOSIS — C50919 Malignant neoplasm of unspecified site of unspecified female breast: Secondary | ICD-10-CM

## 2019-07-27 DIAGNOSIS — C7951 Secondary malignant neoplasm of bone: Secondary | ICD-10-CM

## 2019-07-27 NOTE — Telephone Encounter (Signed)
Scheduled appt per 9/4 los.  Called the patient and left a voice message of appt date and time.  Also contacted [name] and she is aware of the patient appt date and time and will inform the patient of the scheduled appts

## 2019-07-31 NOTE — Telephone Encounter (Signed)
Per Dr. Mickeal Skinner ok to schedule phone visit to discuss Decadron dosing.  LM for patient to call back to schedule and lm for sister to notify of attempt to schedule with patient.

## 2019-08-01 ENCOUNTER — Telehealth: Payer: BC Managed Care – PPO | Admitting: Urology

## 2019-08-06 ENCOUNTER — Inpatient Hospital Stay (HOSPITAL_BASED_OUTPATIENT_CLINIC_OR_DEPARTMENT_OTHER): Payer: BC Managed Care – PPO | Admitting: Internal Medicine

## 2019-08-06 DIAGNOSIS — C7931 Secondary malignant neoplasm of brain: Secondary | ICD-10-CM | POA: Diagnosis not present

## 2019-08-06 NOTE — Progress Notes (Signed)
I connected with Stacey Cox on 08/06/19 at  2:30 PM EDT by telephone visit and verified that I am speaking with the correct person using two identifiers.  I discussed the limitations, risks, security and privacy concerns of performing an evaluation and management service by telemedicine and the availability of in-person appointments. I also discussed with the patient that there may be a patient responsible charge related to this service. The patient expressed understanding and agreed to proceed.  Other persons participating in the visit and their role in the encounter:  n/a  Patient's location:  Home  Provider's location:  Office  Chief Complaint:  Brain metastases   History of Present Ilness: Stacey Cox is doing well, no new complaints.  Still sleeping often, napping during the day.  No recent seizures. Observations: Language fluent, short term memory intact Assessment and Plan: Recommended discontinuing decadron due to clinical stability.   Follow Up Instructions: Return to clinic following MRI brain in November 2020.  I discussed the assessment and treatment plan with the patient.  The patient was provided an opportunity to ask questions and all were answered.  The patient agreed with the plan and demonstrated understanding of the instructions.    The patient was advised to call back or seek an in-person evaluation if the symptoms worsen or if the condition fails to improve as anticipated.  I provided 5-10 minutes of non-face-to-face time during this enocunter.  Stacey Sellers, MD   I provided 5 minutes of non face-to-face telephone visit time during this encounter, and > 50% was spent counseling as documented under my assessment & plan.

## 2019-08-07 ENCOUNTER — Telehealth: Payer: Self-pay | Admitting: Internal Medicine

## 2019-08-07 NOTE — Telephone Encounter (Signed)
Scheduled appt per 9/14 los.  Spoke with patient and she is aware of the appt date and time.

## 2019-08-13 ENCOUNTER — Encounter: Payer: Self-pay | Admitting: *Deleted

## 2019-08-13 ENCOUNTER — Emergency Department (HOSPITAL_COMMUNITY)
Admission: EM | Admit: 2019-08-13 | Discharge: 2019-08-13 | Disposition: A | Discharge status: Home / Self Care | Payer: BC Managed Care – PPO | Attending: Emergency Medicine | Admitting: Emergency Medicine

## 2019-08-13 ENCOUNTER — Telehealth: Payer: Self-pay | Admitting: *Deleted

## 2019-08-13 ENCOUNTER — Encounter (HOSPITAL_COMMUNITY): Payer: Self-pay | Admitting: Emergency Medicine

## 2019-08-13 ENCOUNTER — Other Ambulatory Visit: Payer: Self-pay

## 2019-08-13 ENCOUNTER — Other Ambulatory Visit: Payer: Self-pay | Admitting: Hematology

## 2019-08-13 DIAGNOSIS — C7931 Secondary malignant neoplasm of brain: Secondary | ICD-10-CM | POA: Insufficient documentation

## 2019-08-13 DIAGNOSIS — R569 Unspecified convulsions: Secondary | ICD-10-CM | POA: Insufficient documentation

## 2019-08-13 DIAGNOSIS — C229 Malignant neoplasm of liver, not specified as primary or secondary: Secondary | ICD-10-CM | POA: Insufficient documentation

## 2019-08-13 DIAGNOSIS — R531 Weakness: Secondary | ICD-10-CM | POA: Insufficient documentation

## 2019-08-13 DIAGNOSIS — E119 Type 2 diabetes mellitus without complications: Secondary | ICD-10-CM | POA: Insufficient documentation

## 2019-08-13 DIAGNOSIS — E876 Hypokalemia: Secondary | ICD-10-CM | POA: Insufficient documentation

## 2019-08-13 DIAGNOSIS — Z79899 Other long term (current) drug therapy: Secondary | ICD-10-CM | POA: Insufficient documentation

## 2019-08-13 DIAGNOSIS — R1114 Bilious vomiting: Secondary | ICD-10-CM | POA: Diagnosis not present

## 2019-08-13 DIAGNOSIS — Z85118 Personal history of other malignant neoplasm of bronchus and lung: Secondary | ICD-10-CM | POA: Diagnosis not present

## 2019-08-13 DIAGNOSIS — Z7984 Long term (current) use of oral hypoglycemic drugs: Secondary | ICD-10-CM | POA: Insufficient documentation

## 2019-08-13 DIAGNOSIS — C7951 Secondary malignant neoplasm of bone: Secondary | ICD-10-CM | POA: Diagnosis not present

## 2019-08-13 DIAGNOSIS — Z853 Personal history of malignant neoplasm of breast: Secondary | ICD-10-CM | POA: Insufficient documentation

## 2019-08-13 DIAGNOSIS — R630 Anorexia: Secondary | ICD-10-CM | POA: Diagnosis not present

## 2019-08-13 DIAGNOSIS — R112 Nausea with vomiting, unspecified: Secondary | ICD-10-CM | POA: Diagnosis present

## 2019-08-13 DIAGNOSIS — Z7982 Long term (current) use of aspirin: Secondary | ICD-10-CM | POA: Diagnosis not present

## 2019-08-13 DIAGNOSIS — F17210 Nicotine dependence, cigarettes, uncomplicated: Secondary | ICD-10-CM | POA: Insufficient documentation

## 2019-08-13 LAB — URINALYSIS, ROUTINE W REFLEX MICROSCOPIC
Bilirubin Urine: NEGATIVE
Glucose, UA: NEGATIVE mg/dL
Hgb urine dipstick: NEGATIVE
Ketones, ur: 5 mg/dL — AB
Leukocytes,Ua: NEGATIVE
Nitrite: NEGATIVE
Protein, ur: NEGATIVE mg/dL
Specific Gravity, Urine: 1.006 (ref 1.005–1.030)
pH: 7 (ref 5.0–8.0)

## 2019-08-13 LAB — COMPREHENSIVE METABOLIC PANEL
ALT: 200 U/L — ABNORMAL HIGH (ref 0–44)
AST: 220 U/L — ABNORMAL HIGH (ref 15–41)
Albumin: 2.8 g/dL — ABNORMAL LOW (ref 3.5–5.0)
Alkaline Phosphatase: 687 U/L — ABNORMAL HIGH (ref 38–126)
Anion gap: 11 (ref 5–15)
BUN: 15 mg/dL (ref 8–23)
CO2: 27 mmol/L (ref 22–32)
Calcium: 8.1 mg/dL — ABNORMAL LOW (ref 8.9–10.3)
Chloride: 101 mmol/L (ref 98–111)
Creatinine, Ser: 0.58 mg/dL (ref 0.44–1.00)
GFR calc Af Amer: >60 mL/min (ref >60)
GFR calc non Af Amer: >60 mL/min (ref >60)
Glucose, Bld: 74 mg/dL (ref 70–99)
Potassium: 2.7 mmol/L — CL (ref 3.5–5.1)
Sodium: 139 mmol/L (ref 135–145)
Total Bilirubin: 5 mg/dL — ABNORMAL HIGH (ref 0.3–1.2)
Total Protein: 6.5 g/dL (ref 6.5–8.1)

## 2019-08-13 LAB — CBC WITH DIFFERENTIAL/PLATELET
Abs Immature Granulocytes: 0.03 10*3/uL (ref 0.00–0.07)
Basophils Absolute: 0 10*3/uL (ref 0.0–0.1)
Basophils Relative: 0 %
Eosinophils Absolute: 0.1 10*3/uL (ref 0.0–0.5)
Eosinophils Relative: 1 %
HCT: 44.6 % (ref 36.0–46.0)
Hemoglobin: 14.2 g/dL (ref 12.0–15.0)
Immature Granulocytes: 0 %
Lymphocytes Relative: 8 %
Lymphs Abs: 0.7 10*3/uL (ref 0.7–4.0)
MCH: 30.2 pg (ref 26.0–34.0)
MCHC: 31.8 g/dL (ref 30.0–36.0)
MCV: 94.9 fL (ref 80.0–100.0)
Monocytes Absolute: 0.8 10*3/uL (ref 0.1–1.0)
Monocytes Relative: 9 %
Neutro Abs: 6.5 10*3/uL (ref 1.7–7.7)
Neutrophils Relative %: 82 %
Platelets: 170 10*3/uL (ref 150–400)
RBC: 4.7 MIL/uL (ref 3.87–5.11)
RDW: 15.6 % — ABNORMAL HIGH (ref 11.5–15.5)
WBC: 8.1 10*3/uL (ref 4.0–10.5)
nRBC: 0 % (ref 0.0–0.2)

## 2019-08-13 LAB — LIPASE, BLOOD: Lipase: 14 U/L (ref 11–51)

## 2019-08-13 MED ORDER — DEXAMETHASONE 1 MG PO TABS: 1.0000 mg | ORAL_TABLET | Freq: Every day | ORAL | 0 refills | Status: DC

## 2019-08-13 MED ORDER — SODIUM CHLORIDE 0.9 % IV SOLN
INTRAVENOUS | Status: DC
  Administered 2019-08-13: 11:00:00 via INTRAVENOUS

## 2019-08-13 MED ORDER — ONDANSETRON 4 MG PO TBDP: 4.0000 mg | ORAL_TABLET | Freq: Three times a day (TID) | ORAL | 0 refills | Status: AC | PRN

## 2019-08-13 MED ORDER — ONDANSETRON HCL 4 MG/2ML IJ SOLN
4.0000 mg | Freq: Once | INTRAMUSCULAR | Status: AC
  Administered 2019-08-13: 4 mg via INTRAVENOUS
  Filled 2019-08-13: qty 2

## 2019-08-13 MED ORDER — POTASSIUM CHLORIDE IN NACL 20-0.9 MEQ/L-% IV SOLN
Freq: Once | INTRAVENOUS | Status: AC
  Administered 2019-08-13: 13:00:00 via INTRAVENOUS
  Filled 2019-08-13: qty 1000

## 2019-08-13 MED ORDER — HEPARIN SOD (PORK) LOCK FLUSH 100 UNIT/ML IV SOLN
500.0000 [IU] | Freq: Once | INTRAVENOUS | Status: AC
  Administered 2019-08-13: 16:00:00 500 [IU]
  Filled 2019-08-13: qty 5

## 2019-08-13 MED ORDER — SODIUM CHLORIDE 0.9 % IV BOLUS
1000.0000 mL | Freq: Once | INTRAVENOUS | Status: AC
  Administered 2019-08-13: 1000 mL via INTRAVENOUS

## 2019-08-13 MED ORDER — POTASSIUM CHLORIDE CRYS ER 20 MEQ PO TBCR
40.0000 meq | EXTENDED_RELEASE_TABLET | Freq: Once | ORAL | Status: AC
  Administered 2019-08-13: 13:00:00 40 meq via ORAL
  Filled 2019-08-13: qty 2

## 2019-08-13 NOTE — ED Provider Notes (Signed)
[facility] DEPT Provider Note   CSN: [id number] Arrival date & time: 08/13/19  1024     History   Chief Complaint Chief Complaint  Patient presents with  . Emesis    HPI Haille Dingmann is a 63 y.o. female.     HPI Patient with history of metastatic breast cancer presents after episode of nausea, vomiting, weakness, seizure. Patient notes that yesterday she had a seizure, subsequently was found to have hypotension, hyperglycemia. Today, on checking her vitals she noted that she had hypoglycemia, no reported blood pressure abnormalities. With these concerns for recent changes she spoke with her oncologist and was sent here for evaluation. She notes that she is not currently receiving chemotherapy or radiation therapy, has acknowledged history of metastatic breast cancer including effects in her brain. Over the weekend she has had persistent anorexia, nausea, vomiting multiple times, but no diarrhea, no fever, no pain. Patient currently denies any nausea or pain as well, does acknowledge generalized weakness. Past Medical History:  Diagnosis Date  . Cancer [facility])    Metastatic Breast Cancer    Patient Active Problem List   Diagnosis Date Noted  . Type 2 diabetes mellitus with hyperglycemia, without long-term current use of insulin ([place]) 07/23/2019  . Hyperglycemia 07/23/2019  . Palliative care by specialist   . DNR (do not resuscitate)   . Expressive aphasia   . Brain metastasis ([place]) 01/27/2019  . Seizures ([place]) 07/21/2018  . Macrocytic anemia 07/21/2018  . Thrombocytopenia ([place]) 07/21/2018  . Mild renal insufficiency 07/21/2018  . Anxiety 07/21/2018  . Brain metastases ([place])   . Malignant neoplasm of female breast ([place])   . Breast cancer metastasized to brain ([place]) 07/20/2018  . Port-A-Cath in place 07/17/2018  . Liver metastases ([place]) 07/12/2018  . Genetic testing 08/08/2017  . Palliative care status   . Counseling regarding advance  care planning and goals of care 03/25/2017  . 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 Nausea without vomiting 03/21/2017  . Bone metastases ([place]) 02/01/2017  . 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 Malignant neoplasm metastatic to lung ([place])   . Cancer associated pain   . Carcinoma of breast metastatic to brain ([place]) 01/21/2017  . Pathologic fracture   . Breast mass, left     Past Surgical History:  Procedure Laterality Date  . CHOLECYSTECTOMY    . IR IMAGING GUIDED PORT INSERTION  07/14/2018  . TONSILLECTOMY       OB History   No obstetric history on file.      Home Medications    Prior to Admission medications   Medication Sig Start Date End Date Taking? Authorizing Provider  aspirin EC 81 MG tablet Take 1 tablet (81 mg total) by mouth daily. 08/01/17  Yes [name], MD  dexamethasone (DECADRON) 1 MG tablet TAKE 1 TABLET(1 MG) BY MOUTH DAILY Patient taking differently: Take 1 mg by mouth daily with breakfast.  05/28/19  Yes Vaslow, Acey Lav, MD  DULoxetine (CYMBALTA) 60 MG capsule Take 1 capsule (60 mg total) by mouth daily. 07/24/19  Yes [name], MD  feeding supplement, ENSURE ENLIVE, (ENSURE ENLIVE) LIQD Take 237 mLs by mouth 2 (two) times daily between meals. 07/22/18  Yes Sheikh, Omair Latif, DO  glipiZIDE (GLUCOTROL) 5 MG tablet Take 1 tablet (5 mg total) by mouth 2 (two) times daily before a meal. 07/20/19  Yes Shamleffer, Melanie Crazier, MD  JANUVIA 50 MG tablet TAKE 1 TABLET(50 MG) BY MOUTH DAILY Patient taking  differently: Take 50 mg by mouth daily.  07/02/19  Yes [name], MD  levETIRAcetam (KEPPRA) 1000 MG tablet Take 1 tablet (1,000 mg total) by mouth 2 (two) times daily. 03/26/19  Yes Vaslow, Acey Lav, MD  pantoprazole (PROTONIX) 40 MG tablet Take 40 mg by mouth daily. 08/01/19  Yes [provider]  Vitamin D, Ergocalciferol, (DRISDOL) 1.25 MG (50000 UT) CAPS capsule TAKE 1 CAPSULE BY MOUTH 3 TIMES WEEKLY Patient taking  differently: Take 50,000 Units by mouth 3 (three) times a week. TAKE 1 CAPSULE BY MOUTH 3 TIMES WEEKLY 02/05/19  Yes [name], MD  chlorhexidine (PERIDEX) 0.12 % solution Use as directed 15 mLs in the mouth or throat 2 (two) times daily as needed (mouth rinse after vomiting). Swish and spit Patient not taking: Reported on 07/20/2019 04/05/19   [name], MD    Family History Family History  Problem Relation Age of Onset  . Cancer Mother        breast  . Cancer Sister        breast    Social History Social History   Tobacco Use  . Smoking status: Current Every Day Smoker    Packs/day: 0.15    Types: Cigarettes    Last attempt to quit: 01/21/2017    Years since quitting: 2.5  . Smokeless tobacco: Never Used  Substance Use Topics  . Alcohol use: No    Alcohol/week: 0.0 standard drinks  . Drug use: No     Allergies   Thorazine [chlorpromazine] and Ribociclib   Review of Systems Review of Systems  Constitutional:       Per HPI, otherwise negative  HENT:       Per HPI, otherwise negative  Respiratory:       Per HPI, otherwise negative  Cardiovascular:       Per HPI, otherwise negative  Gastrointestinal: Positive for nausea and vomiting.  Endocrine:       Negative aside from HPI  Genitourinary:       Neg aside from HPI   Musculoskeletal:       Per HPI, otherwise negative  Skin: Negative.   Allergic/Immunologic: Positive for immunocompromised state.  Neurological: Positive for seizures and weakness. Negative for syncope.     Physical Exam Updated Vital Signs BP 104/70   Pulse 95   Resp 16   SpO2 98%   Physical Exam Vitals signs and nursing note reviewed.  Constitutional:      General: She is not in acute distress.    Appearance: She is well-developed. She is ill-appearing.  HENT:     Head: Normocephalic and atraumatic.  Eyes:     Conjunctiva/sclera: Conjunctivae normal.  Cardiovascular:     Rate and Rhythm: Normal rate and regular  rhythm.  Pulmonary:     Effort: Pulmonary effort is normal. No respiratory distress.     Breath sounds: Normal breath sounds. No stridor.  Abdominal:     General: There is no distension.  Skin:    General: Skin is warm and dry.  Neurological:     Mental Status: She is alert and oriented to person, place, and time.     Cranial Nerves: No cranial nerve deficit.      ED Treatments / Results  Labs (all labs ordered are listed, but only abnormal results are displayed) Labs Reviewed  COMPREHENSIVE METABOLIC PANEL - Abnormal; Notable for the following components:      Result Value   Potassium 2.7 (*)  Calcium 8.1 (*)    Albumin 2.8 (*)    AST 220 (*)    ALT 200 (*)    Alkaline Phosphatase 687 (*)    Total Bilirubin 5.0 (*)    All other components within normal limits  CBC WITH DIFFERENTIAL/PLATELET - Abnormal; Notable for the following components:   RDW 15.6 (*)    All other components within normal limits  LIPASE, BLOOD  URINALYSIS, ROUTINE W REFLEX MICROSCOPIC    EKG None  Radiology No results found.  Procedures Procedures (including critical care time)  Medications Ordered in ED Medications  sodium chloride 0.9 % bolus 1,000 mL (0 mLs Intravenous Stopped 08/13/19 1309)    And  0.9 %  sodium chloride infusion ( Intravenous New Bag/Given (Non-Interop) 08/13/19 1127)  ondansetron (ZOFRAN) injection 4 mg (4 mg Intravenous Given 08/13/19 1126)  0.9 % NaCl with KCl 20 mEq/ L  infusion ( Intravenous New Bag/Given (Non-Interop) 08/13/19 1308)  potassium chloride SA (K-DUR) CR tablet 40 mEq (40 mEq Oral Given 08/13/19 1302)     Initial Impression / Assessment and Plan / ED Course  I have reviewed the triage vital signs and the nursing notes.  Pertinent labs & imaging results that were available during my care of the patient were reviewed by me and considered in my medical decision making (see chart for details).    On repeat exam the patient is awake and alert.  No  ongoing complaints. Chart review notable for ongoing evaluation for metastatic cancer with brain metastases.  On repeat exam patient is now accompanied by her sister. Patient sleeping, but awakens easily. We discussed all findings as far including mild electrolyte abnormalities, slight elevation in bilirubin. I have discussed patient's case with her oncologist, who notes that with mild renal insufficiency, Decadron should be provided, and should the patient be discharged he will follow-up with her in the clinic this week. I discussed all findings again with the patient and her sister, and she continues to receive potassium repletion.     3:21 PM Patient awake, alert, smiling. No additional seizure activity, no additional vomiting.  On she has received potassium repletion, vital signs generally unremarkable. After lengthy conversation with the patient, and previously with her sister about all findings, pros and cons of admission versus discharge, patient is a strong preference for discharge, with close outpatient follow-up with her oncology team.  She has no abdominal pain, has had no additional vomiting here, and additional evaluation of her hypokalemia, hyperbilirubinemia may be performed with her outpatient team. Patient has capacity to make this decision, and given her ability for close follow-up, this seems reasonable, now that she has had potassium repletion, fluid repletion, received a Decadron.   Final Clinical Impressions(s) / ED Diagnoses   Final diagnoses:  Bilious vomiting with nausea  Hypokalemia     Carmin Muskrat, MD 08/13/19 1524

## 2019-08-13 NOTE — ED Notes (Signed)
Date and time results received: 08/13/19 12:14 PM   Test: potassium Critical Value: 2.7  Name of Provider Notified: Vanita Panda MD  Orders Received? Or Actions Taken?: acknowledges result

## 2019-08-13 NOTE — Telephone Encounter (Signed)
Contacted by patient's sister Claiborne Billings. Patient had following events over weekend: Barely able to walk - walking very little; barely able to talk; not eating much; sleeping a lot; not taking medication. Had a seizure this morning, sister called 64, EMS evaluated, b/p in the 80's. EMS wanted to take patient to hospital, patient declined. Sister wants to know what to do. Asking if maybe home health should be restarted?  Dr.Kale given information. Per Dr.Kale, contacted sister and advised that patient should come to hospital to be evaluated - these type of changes can be from many different causes and only way to find out is to come to hospital. Sister states that she will call 911 again and advise patient that Dr.Kale recommends that she be evaluated at the hospital.  Contacted sister again to f/u - sister stated EMS transporting patient to hospital at this time.

## 2019-08-13 NOTE — ED Triage Notes (Signed)
Per [facility] pt from home for n/v since Friday. hasnt eaten since Friday. Has stage 4 breast cancer with brain mets.  116/70, 90s HR, CBG 97.

## 2019-08-13 NOTE — ED Notes (Signed)
ED Provider at bedside. 

## 2019-08-13 NOTE — Discharge Instructions (Signed)
As discussed, it is important that you follow-up with your oncology team this week. Please be sure to call tomorrow for appointment. Please stay well-hydrated, monitor your condition carefully, and do not hesitate to return here should he develop new, or concerning changes in your condition.

## 2019-08-14 ENCOUNTER — Telehealth: Payer: Self-pay | Admitting: *Deleted

## 2019-08-14 NOTE — Telephone Encounter (Signed)
Sister Claiborne Billings called to update on patient's ED visit yesterday. She reports patient walking/talking/eating following fluids and electrolytes. She is concerned about her sister living alone  and is considering taking her back to [place] with her and is wondering how to set up her care and treatment in [place]. Dr. Irene Limbo given information and question.

## 2019-08-15 ENCOUNTER — Telehealth: Payer: Self-pay | Admitting: *Deleted

## 2019-08-15 NOTE — Telephone Encounter (Signed)
Patient moving to [facility] with sister Claiborne Billings in October. Dr.Kale recommended [facility]. Contacted [facility] to refer patient for continuation of care. Patient clinical and demographic information given to [facility] (referral Eliane Decree). Appt made for patient on Oct 6 at [facility] office with Reginia Forts,  MD. Claiborne Billings contacted and given appt info. Received faxed request for clinical documentation from [facility] for medical records - request sent to [facility].

## 2019-08-17 ENCOUNTER — Inpatient Hospital Stay: Payer: BC Managed Care – PPO

## 2019-08-17 ENCOUNTER — Other Ambulatory Visit: Payer: Self-pay

## 2019-08-17 ENCOUNTER — Telehealth: Payer: Self-pay | Admitting: *Deleted

## 2019-08-17 ENCOUNTER — Emergency Department (HOSPITAL_COMMUNITY)
Admission: EM | Admit: 2019-08-17 | Discharge: 2019-08-17 | Disposition: A | Discharge status: Home / Self Care | Payer: BC Managed Care – PPO | Attending: Emergency Medicine | Admitting: Emergency Medicine

## 2019-08-17 ENCOUNTER — Encounter (HOSPITAL_COMMUNITY): Payer: Self-pay | Admitting: Obstetrics and Gynecology

## 2019-08-17 ENCOUNTER — Telehealth: Payer: Self-pay | Admitting: Hematology

## 2019-08-17 DIAGNOSIS — E876 Hypokalemia: Secondary | ICD-10-CM | POA: Diagnosis not present

## 2019-08-17 DIAGNOSIS — Z79899 Other long term (current) drug therapy: Secondary | ICD-10-CM | POA: Insufficient documentation

## 2019-08-17 DIAGNOSIS — R945 Abnormal results of liver function studies: Secondary | ICD-10-CM | POA: Diagnosis not present

## 2019-08-17 DIAGNOSIS — Z7984 Long term (current) use of oral hypoglycemic drugs: Secondary | ICD-10-CM | POA: Diagnosis not present

## 2019-08-17 DIAGNOSIS — E119 Type 2 diabetes mellitus without complications: Secondary | ICD-10-CM | POA: Diagnosis not present

## 2019-08-17 DIAGNOSIS — F17210 Nicotine dependence, cigarettes, uncomplicated: Secondary | ICD-10-CM | POA: Insufficient documentation

## 2019-08-17 DIAGNOSIS — R531 Weakness: Secondary | ICD-10-CM

## 2019-08-17 DIAGNOSIS — C7800 Secondary malignant neoplasm of unspecified lung: Secondary | ICD-10-CM | POA: Insufficient documentation

## 2019-08-17 DIAGNOSIS — C787 Secondary malignant neoplasm of liver and intrahepatic bile duct: Secondary | ICD-10-CM | POA: Diagnosis not present

## 2019-08-17 DIAGNOSIS — C7981 Secondary malignant neoplasm of breast: Secondary | ICD-10-CM | POA: Diagnosis not present

## 2019-08-17 DIAGNOSIS — Z7982 Long term (current) use of aspirin: Secondary | ICD-10-CM | POA: Diagnosis not present

## 2019-08-17 DIAGNOSIS — C7931 Secondary malignant neoplasm of brain: Secondary | ICD-10-CM | POA: Insufficient documentation

## 2019-08-17 LAB — CBC
HCT: 45.7 % (ref 36.0–46.0)
Hemoglobin: 15.1 g/dL — ABNORMAL HIGH (ref 12.0–15.0)
MCH: 31.1 pg (ref 26.0–34.0)
MCHC: 33 g/dL (ref 30.0–36.0)
MCV: 94.2 fL (ref 80.0–100.0)
Platelets: 172 10*3/uL (ref 150–400)
RBC: 4.85 MIL/uL (ref 3.87–5.11)
RDW: 15.6 % — ABNORMAL HIGH (ref 11.5–15.5)
WBC: 9 10*3/uL (ref 4.0–10.5)
nRBC: 0 % (ref 0.0–0.2)

## 2019-08-17 LAB — URINALYSIS, ROUTINE W REFLEX MICROSCOPIC
Bilirubin Urine: NEGATIVE
Glucose, UA: NEGATIVE mg/dL
Hgb urine dipstick: NEGATIVE
Ketones, ur: NEGATIVE mg/dL
Leukocytes,Ua: NEGATIVE
Nitrite: NEGATIVE
Protein, ur: NEGATIVE mg/dL
Specific Gravity, Urine: 1.006 (ref 1.005–1.030)
pH: 6 (ref 5.0–8.0)

## 2019-08-17 LAB — BASIC METABOLIC PANEL
Anion gap: 11 (ref 5–15)
BUN: 10 mg/dL (ref 8–23)
CO2: 27 mmol/L (ref 22–32)
Calcium: 9 mg/dL (ref 8.9–10.3)
Chloride: 102 mmol/L (ref 98–111)
Creatinine, Ser: 0.48 mg/dL (ref 0.44–1.00)
GFR calc Af Amer: >60 mL/min (ref >60)
GFR calc non Af Amer: >60 mL/min (ref >60)
Glucose, Bld: 115 mg/dL — ABNORMAL HIGH (ref 70–99)
Potassium: 2.9 mmol/L — ABNORMAL LOW (ref 3.5–5.1)
Sodium: 140 mmol/L (ref 135–145)

## 2019-08-17 LAB — HEPATIC FUNCTION PANEL
ALT: 224 U/L — ABNORMAL HIGH (ref 0–44)
AST: 281 U/L — ABNORMAL HIGH (ref 15–41)
Albumin: 2.8 g/dL — ABNORMAL LOW (ref 3.5–5.0)
Alkaline Phosphatase: 747 U/L — ABNORMAL HIGH (ref 38–126)
Bilirubin, Direct: 2.5 mg/dL — ABNORMAL HIGH (ref 0.0–0.2)
Indirect Bilirubin: 1.7 mg/dL — ABNORMAL HIGH (ref 0.3–0.9)
Total Bilirubin: 4.2 mg/dL — ABNORMAL HIGH (ref 0.3–1.2)
Total Protein: 6.4 g/dL — ABNORMAL LOW (ref 6.5–8.1)

## 2019-08-17 LAB — CBG MONITORING, ED
Glucose-Capillary: 105 mg/dL — ABNORMAL HIGH (ref 70–99)
Glucose-Capillary: 152 mg/dL — ABNORMAL HIGH (ref 70–99)

## 2019-08-17 LAB — LACTIC ACID, PLASMA: Lactic Acid, Venous: 2.1 mmol/L (ref 0.5–1.9)

## 2019-08-17 MED ORDER — SODIUM CHLORIDE 0.9 % IV BOLUS
1000.0000 mL | Freq: Once | INTRAVENOUS | Status: AC
  Administered 2019-08-17: 1000 mL via INTRAVENOUS

## 2019-08-17 MED ORDER — SODIUM CHLORIDE 0.9% FLUSH
3.0000 mL | Freq: Once | INTRAVENOUS | Status: AC
  Administered 2019-08-17: 12:00:00 3 mL via INTRAVENOUS

## 2019-08-17 MED ORDER — POTASSIUM CHLORIDE CRYS ER 20 MEQ PO TBCR
40.0000 meq | EXTENDED_RELEASE_TABLET | Freq: Once | ORAL | Status: AC
  Administered 2019-08-17: 40 meq via ORAL
  Filled 2019-08-17: qty 2

## 2019-08-17 MED ORDER — POTASSIUM CHLORIDE ER 10 MEQ PO TBCR: 20.0000 meq | EXTENDED_RELEASE_TABLET | Freq: Every day | ORAL | 0 refills | Status: DC

## 2019-08-17 MED ORDER — HEPARIN SOD (PORK) LOCK FLUSH 100 UNIT/ML IV SOLN
500.0000 [IU] | Freq: Once | INTRAVENOUS | Status: AC | PRN
  Administered 2019-08-17: 500 [IU]
  Filled 2019-08-17: qty 5

## 2019-08-17 NOTE — ED Triage Notes (Addendum)
Per EMS: Patient is coming from home. Patient has a hx of stage 4 breast cancer with brain mets  Patient reported to have generalized weakness.  Patient oriented to self, situation, but confused on time.  Patient was supposed to be seen at cancer center this morning but was too weak to get to her appointment.  Cancer center called and they said for patient to come here for eval.  Pt's vitals WNL.  Patient denies N/V/D, fever, or pain.

## 2019-08-17 NOTE — ED Provider Notes (Signed)
[place] DEPT Provider Note   CSN: [id number] Arrival date & time: 08/17/19  1049     History   Chief Complaint Chief Complaint  Patient presents with   Weakness    HPI Stacey Cox is a 63 y.o. female.     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 She states she had low blood sugar this morning, BS 66. She states that she drank some ginger ale and it came back up. She states she has been feeling generally weak for a few days. She felt this way recently when she was in the hospital and her electrolytes were low.   Denies headaches, vision changes, numbness/weakness, dizziness, or lightheadedness.  Denies fever, chest pain, shortness of breath, cough, abd pain, diarrhea, constipation, dysuria, frequency. She did vomit once yesterday, but has not vomited since.   Pts visitor at bedside states that patient was disoriented this AM when her blood sugar was low. She states these symptoms have since resolved and that the patient is at her mental baseline.   Pt was supposed to have blood work this AM with her oncologist, however since she was feeling weak she was advised to come to the ED.  Of note, pt is currently on palliative care and is a DNR.   Past Medical History:  Diagnosis Date   Cancer [facility])    Metastatic Breast Cancer    Patient Active Problem List   Diagnosis Date Noted   Type 2 diabetes mellitus with hyperglycemia, without long-term current use of insulin ([place]) 07/23/2019   Hyperglycemia 07/23/2019   Palliative care by specialist    DNR (do not resuscitate)    Expressive aphasia    Brain metastasis ([place]) 01/27/2019   Seizures ([place]) 07/21/2018   Macrocytic anemia 07/21/2018   Thrombocytopenia ([place]) 07/21/2018   Mild renal insufficiency 07/21/2018   Anxiety 07/21/2018   Brain  metastases ([place])    Malignant neoplasm of female breast ([place])    Breast cancer metastasized to brain ([place]) 07/20/2018   Port-A-Cath in place 07/17/2018   Liver metastases ([place]) 07/12/2018   Genetic testing 08/08/2017   Palliative care status    Counseling regarding advance care planning and goals of care 03/25/2017   Palmar plantar erythrodysaesthesia 03/21/2017   Nausea without vomiting 03/21/2017   Bone metastases ([place]) 02/01/2017   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      Home Medications    Prior to Admission medications   Medication Sig Start Date End Date Taking? Authorizing Provider  aspirin EC 81 MG tablet Take 1 tablet (81 mg total) by mouth daily. 08/01/17   [name], MD  chlorhexidine (PERIDEX) 0.12 % solution Use as directed 15 mLs in the mouth or throat 2 (two) times daily as needed (mouth rinse after vomiting). Swish and spit Patient not taking: Reported on 07/20/2019 04/05/19   Sullivan Lone  Vivien Rossetti, MD  dexamethasone (DECADRON) 1 MG tablet Take 1 tablet (1 mg total) by mouth daily with breakfast. 08/13/19   Carmin Muskrat, MD  DULoxetine (CYMBALTA) 60 MG capsule Take 1 capsule (60 mg total) by mouth daily. 07/24/19   [name], MD  feeding supplement, ENSURE ENLIVE, (ENSURE ENLIVE) LIQD Take 237 mLs by mouth 2 (two) times daily between meals. 07/22/18   Sheikh, Omair Latif, DO  glipiZIDE (GLUCOTROL) 5 MG tablet Take 1 tablet (5 mg total) by mouth 2 (two) times daily before a meal. 07/20/19   Shamleffer, Melanie Crazier, MD  JANUVIA 50 MG tablet TAKE 1 TABLET(50 MG) BY MOUTH DAILY  08/13/19   [name], MD  levETIRAcetam (KEPPRA) 1000 MG tablet Take 1 tablet (1,000 mg total) by mouth 2 (two) times daily. 03/26/19   Ventura Sellers, MD  ondansetron (ZOFRAN ODT) 4 MG disintegrating tablet Take 1 tablet (4 mg total) by mouth every 8 (eight) hours as needed for nausea or vomiting. 08/13/19   Carmin Muskrat, MD  pantoprazole (PROTONIX) 40 MG tablet Take 40 mg by mouth daily. 08/01/19   [provider]  potassium chloride (K-DUR) 10 MEQ tablet Take 2 tablets (20 mEq total) by mouth daily for 3 days. 08/17/19 08/20/19  Kalana Yust S, PA-C  Vitamin D, Ergocalciferol, (DRISDOL) 1.25 MG (50000 UT) CAPS capsule TAKE 1 CAPSULE BY MOUTH 3 TIMES WEEKLY Patient taking differently: Take 50,000 Units by mouth 3 (three) times a week. TAKE 1 CAPSULE BY MOUTH 3 TIMES WEEKLY 02/05/19   [name], MD    Family History Family History  Problem Relation Age of Onset   Cancer Mother        breast   Cancer Sister        breast    Social History Social History   Tobacco Use   Smoking status: Current Every Day Smoker    Packs/day: 0.15    Types: Cigarettes    Last attempt to quit: 01/21/2017    Years since quitting: 2.5   Smokeless tobacco: Never Used  Substance Use Topics   Alcohol use: No    Alcohol/week: 0.0 standard drinks   Drug use: No     Allergies   Thorazine [chlorpromazine] and Ribociclib   Review of Systems Review of Systems  Constitutional: Negative for chills and fever.  HENT: Negative for ear pain and sore throat.   Eyes: Negative for visual disturbance.  Respiratory: Negative for cough and shortness of breath.   Cardiovascular: Negative for chest pain and palpitations.  Gastrointestinal: Negative for abdominal pain, constipation, diarrhea, nausea and vomiting.  Genitourinary: Negative for dysuria and hematuria.  Musculoskeletal: Negative for back pain.  Skin: Negative for rash.  Neurological: Positive for weakness. Negative  for dizziness, light-headedness, numbness and headaches.  All other systems reviewed and are negative.    Physical Exam Updated Vital Signs BP 118/89    Pulse 92    Temp 98.2 F (36.8 C) (Oral)    Resp 11    SpO2 94%   Physical Exam Vitals signs and nursing note reviewed.  Constitutional:      General: She is not in acute distress.    Appearance: She is well-developed.  HENT:     Head: Normocephalic and atraumatic.  Eyes:     Conjunctiva/sclera: Conjunctivae normal.  Neck:     Musculoskeletal: Neck supple.  Cardiovascular:     Rate and Rhythm: Normal rate and regular rhythm.     Pulses: Normal  pulses.     Heart sounds: Normal heart sounds. No murmur.  Pulmonary:     Effort: Pulmonary effort is normal. No respiratory distress.     Breath sounds: Normal breath sounds. No wheezing, rhonchi or rales.  Abdominal:     General: Bowel sounds are normal.     Palpations: Abdomen is soft.     Tenderness: There is no abdominal tenderness. There is no guarding or rebound.  Skin:    General: Skin is warm and dry.  Neurological:     Mental Status: She is alert.      ED Treatments / Results  Labs (all labs ordered are listed, but only abnormal results are displayed) Labs Reviewed  BASIC METABOLIC PANEL - Abnormal; Notable for the following components:      Result Value   Potassium 2.9 (*)    Glucose, Bld 115 (*)    All other components within normal limits  CBC - Abnormal; Notable for the following components:   Hemoglobin 15.1 (*)    RDW 15.6 (*)    All other components within normal limits  URINALYSIS, ROUTINE W REFLEX MICROSCOPIC - Abnormal; Notable for the following components:   Color, Urine AMBER (*)    All other components within normal limits  HEPATIC FUNCTION PANEL - Abnormal; Notable for the following components:   Total Protein 6.4 (*)    Albumin 2.8 (*)    AST 281 (*)    ALT 224 (*)    Alkaline Phosphatase 747 (*)    Total Bilirubin 4.2 (*)    Bilirubin,  Direct 2.5 (*)    Indirect Bilirubin 1.7 (*)    All other components within normal limits  LACTIC ACID, PLASMA - Abnormal; Notable for the following components:   Lactic Acid, Venous 2.1 (*)    All other components within normal limits  CBG MONITORING, ED - Abnormal; Notable for the following components:   Glucose-Capillary 105 (*)    All other components within normal limits  CBG MONITORING, ED - Abnormal; Notable for the following components:   Glucose-Capillary 152 (*)    All other components within normal limits  URINE CULTURE    EKG EKG Interpretation  Date/Time:  Friday August 17 2019 15:47:19 EDT Ventricular Rate:  91 PR Interval:    QRS Duration: 83 QT Interval:  415 QTC Calculation: 511 R Axis:   -47 Text Interpretation:  Sinus rhythm Left anterior fascicular block Low voltage, precordial leads Consider anterior infarct Prolonged QT interval prolonged QT improved  Confirmed by Wandra Arthurs [phone]) on 08/17/2019 3:48:55 PM   Radiology No results found.  Procedures Procedures (including critical care time)  Medications Ordered in ED Medications  sodium chloride flush (NS) 0.9 % injection 3 mL (3 mLs Intravenous Given 08/17/19 1135)  heparin lock flush 100 unit/mL (500 Units Intracatheter Given 08/17/19 1615)  potassium chloride SA (K-DUR) CR tablet 40 mEq (40 mEq Oral Given 08/17/19 1412)  sodium chloride 0.9 % bolus 1,000 mL (0 mLs Intravenous Stopped 08/17/19 1545)     Initial Impression / Assessment and Plan / ED Course  I have reviewed the triage vital signs and the nursing notes.  Pertinent labs & imaging results that were available during my care of the patient were reviewed by me and considered in my medical decision making (see chart for details).   Final Clinical Impressions(s) / ED Diagnoses   Final diagnoses:  Generalized weakness  Hypokalemia  Abnormal liver function   63 year old female with history  of stage IV breast cancer presenting for  evaluation of generalized weakness and hypoglycemia.  She is on glipizide 5 mg twice daily.  On arrival, she is marginally hypotensive and marginally tachycardic, remainder of vital signs are reassuring.  However exam is reassuring.  She is neuro intact.  Lungs are clear to auscultation bilaterally.  Abdomen soft and nontender.  Her hypoglycemia resolved after drinking some fluids and on arrival to the ED she has had normal blood sugars throughout her stay.  Obtain labs and UA.  Hemoglobin stable, no leukocytosis.  She is mildly hypokalemic, potassium was supplemented.  Kidney function is within normal limits.  Transaminases are elevated however consistent with prior.  Patient made aware of this and advised to have repeat labs with her oncologist.  Initial lactic acid marginally elevated, low suspicion for sepsis.  UA negative for UTI.  Initial EKG with prolonged QT.  Has history of same but worsened today.  Repeat EKG after potassium with improvement of QTC.  Patient received 1 L IV fluid bolus and her potassium was supplemented.  On reassessment her blood pressure has significantly improved and she states that she feels back to her baseline.  I spent suspect that her symptoms are secondary to her hypoglycemia and dehydration.  I advised that she hold a dose of her glipizide due to concern for hypoglycemia.  Advised that she contact her PCP for further recommendations on dosage of her glipizide.  Further advised to follow-up about her liver enzymes.  Advised on specific return precautions.  She voiced understanding and is in agreement plan.  All questions answered.  Patient stable discharge. . Case discussed with DR Wilson Singer, supervising physician who is in agreement with plan  ED Discharge Orders         Ordered    potassium chloride (K-DUR) 10 MEQ tablet  Daily     08/17/19 [address], PA-C 08/18/19 0709    Virgel Manifold, MD 08/18/19 [phone]

## 2019-08-17 NOTE — ED Notes (Signed)
Pt ambulated to/from the restroom with standby assist. Gait steady, just slow. No other issues observed.

## 2019-08-17 NOTE — Telephone Encounter (Signed)
Left message for patient re 9/28 appointment.  Per 9/25 schedule message also confirmed appointment with patient sister Claiborne Billings.

## 2019-08-17 NOTE — Telephone Encounter (Signed)
Faxed medical records to [facility] at [phone] release ID [id number]

## 2019-08-17 NOTE — Discharge Instructions (Signed)
Please take only one dose of your glipizide until you are able to follow-up with your regular doctor in regards to dosing of this medication since her blood sugars were low this morning.    You are also given a prescription for potassium pills.  Please take as directed.    Please make sure to stay well-hydrated  Please make sure to follow-up with your oncologist in regards to your elevated liver enzymes and have your labs rechecked.  Please return to the emergency department for any new or worsening symptoms.

## 2019-08-17 NOTE — Telephone Encounter (Signed)
Patient's sister Stacey Cox called - patient is not felling well this morning. Patient is too weak to come to lab/injection appt today and they would like to reschedule it to Monday if ok with Dr. Irene Limbo. Blood glucose checked by friend staying with her 19. Advised sister that Dr.Kale will be given this information.  Dr. Irene Limbo ok with r/s today's lab/injection appts for Monday 9/28.Encourage patient to eat/drink and recheck glucose. He recommends patient come to hospital for evaluation if possible. Sister worried that electrolytes may be off like this past Monday or that patient may be dehydrated. She they will encourage patient to eat/drink and try to convince patient to come to hospital and if not, may call EMS.

## 2019-08-19 LAB — URINE CULTURE

## 2019-08-20 ENCOUNTER — Telehealth: Payer: Self-pay

## 2019-08-20 ENCOUNTER — Other Ambulatory Visit: Payer: Self-pay

## 2019-08-20 ENCOUNTER — Encounter (HOSPITAL_COMMUNITY): Payer: Self-pay | Admitting: *Deleted

## 2019-08-20 ENCOUNTER — Inpatient Hospital Stay: Payer: BC Managed Care – PPO

## 2019-08-20 ENCOUNTER — Emergency Department (HOSPITAL_COMMUNITY): Payer: BC Managed Care – PPO

## 2019-08-20 ENCOUNTER — Inpatient Hospital Stay (HOSPITAL_BASED_OUTPATIENT_CLINIC_OR_DEPARTMENT_OTHER): Payer: BC Managed Care – PPO | Admitting: Medical

## 2019-08-20 ENCOUNTER — Other Ambulatory Visit: Payer: Self-pay | Admitting: Hematology

## 2019-08-20 ENCOUNTER — Other Ambulatory Visit: Payer: Self-pay | Admitting: Medical

## 2019-08-20 ENCOUNTER — Inpatient Hospital Stay (HOSPITAL_COMMUNITY)
Admission: EM | Admit: 2019-08-20 | Discharge: 2019-08-27 | DRG: 080 | Disposition: A | Discharge status: Hospice | Payer: BC Managed Care – PPO | Source: Ambulatory Visit | Attending: Internal Medicine | Admitting: Internal Medicine

## 2019-08-20 VITALS — BP 99/77 | HR 109 | Temp 98.5°F | Resp 16 | Wt 172.5 lb

## 2019-08-20 DIAGNOSIS — Z79899 Other long term (current) drug therapy: Secondary | ICD-10-CM

## 2019-08-20 DIAGNOSIS — C787 Secondary malignant neoplasm of liver and intrahepatic bile duct: Secondary | ICD-10-CM

## 2019-08-20 DIAGNOSIS — C7931 Secondary malignant neoplasm of brain: Secondary | ICD-10-CM | POA: Diagnosis present

## 2019-08-20 DIAGNOSIS — G9341 Metabolic encephalopathy: Secondary | ICD-10-CM | POA: Diagnosis present

## 2019-08-20 DIAGNOSIS — R410 Disorientation, unspecified: Secondary | ICD-10-CM | POA: Diagnosis not present

## 2019-08-20 DIAGNOSIS — C799 Secondary malignant neoplasm of unspecified site: Secondary | ICD-10-CM

## 2019-08-20 DIAGNOSIS — C50912 Malignant neoplasm of unspecified site of left female breast: Secondary | ICD-10-CM | POA: Diagnosis present

## 2019-08-20 DIAGNOSIS — G936 Cerebral edema: Principal | ICD-10-CM | POA: Diagnosis present

## 2019-08-20 DIAGNOSIS — D72829 Elevated white blood cell count, unspecified: Secondary | ICD-10-CM | POA: Diagnosis not present

## 2019-08-20 DIAGNOSIS — C7800 Secondary malignant neoplasm of unspecified lung: Secondary | ICD-10-CM

## 2019-08-20 DIAGNOSIS — R042 Hemoptysis: Secondary | ICD-10-CM

## 2019-08-20 DIAGNOSIS — C50919 Malignant neoplasm of unspecified site of unspecified female breast: Secondary | ICD-10-CM | POA: Diagnosis not present

## 2019-08-20 DIAGNOSIS — Z720 Tobacco use: Secondary | ICD-10-CM | POA: Diagnosis present

## 2019-08-20 DIAGNOSIS — M7989 Other specified soft tissue disorders: Secondary | ICD-10-CM | POA: Diagnosis present

## 2019-08-20 DIAGNOSIS — C7949 Secondary malignant neoplasm of other parts of nervous system: Secondary | ICD-10-CM

## 2019-08-20 DIAGNOSIS — Z95828 Presence of other vascular implants and grafts: Secondary | ICD-10-CM

## 2019-08-20 DIAGNOSIS — F17210 Nicotine dependence, cigarettes, uncomplicated: Secondary | ICD-10-CM | POA: Diagnosis present

## 2019-08-20 DIAGNOSIS — C7951 Secondary malignant neoplasm of bone: Secondary | ICD-10-CM

## 2019-08-20 DIAGNOSIS — Z888 Allergy status to other drugs, medicaments and biological substances status: Secondary | ICD-10-CM

## 2019-08-20 DIAGNOSIS — Z515 Encounter for palliative care: Secondary | ICD-10-CM | POA: Diagnosis not present

## 2019-08-20 DIAGNOSIS — G40909 Epilepsy, unspecified, not intractable, without status epilepticus: Secondary | ICD-10-CM | POA: Diagnosis present

## 2019-08-20 DIAGNOSIS — Z6829 Body mass index (BMI) 29.0-29.9, adult: Secondary | ICD-10-CM

## 2019-08-20 DIAGNOSIS — Z809 Family history of malignant neoplasm, unspecified: Secondary | ICD-10-CM

## 2019-08-20 DIAGNOSIS — R627 Adult failure to thrive: Secondary | ICD-10-CM | POA: Diagnosis present

## 2019-08-20 DIAGNOSIS — Z9221 Personal history of antineoplastic chemotherapy: Secondary | ICD-10-CM

## 2019-08-20 DIAGNOSIS — R569 Unspecified convulsions: Secondary | ICD-10-CM

## 2019-08-20 DIAGNOSIS — Z7982 Long term (current) use of aspirin: Secondary | ICD-10-CM

## 2019-08-20 DIAGNOSIS — Z87892 Personal history of anaphylaxis: Secondary | ICD-10-CM

## 2019-08-20 DIAGNOSIS — R4701 Aphasia: Secondary | ICD-10-CM | POA: Diagnosis present

## 2019-08-20 DIAGNOSIS — R112 Nausea with vomiting, unspecified: Secondary | ICD-10-CM

## 2019-08-20 DIAGNOSIS — E559 Vitamin D deficiency, unspecified: Secondary | ICD-10-CM | POA: Diagnosis present

## 2019-08-20 DIAGNOSIS — Z7189 Other specified counseling: Secondary | ICD-10-CM

## 2019-08-20 DIAGNOSIS — E119 Type 2 diabetes mellitus without complications: Secondary | ICD-10-CM | POA: Diagnosis present

## 2019-08-20 DIAGNOSIS — F329 Major depressive disorder, single episode, unspecified: Secondary | ICD-10-CM | POA: Diagnosis present

## 2019-08-20 DIAGNOSIS — Z20828 Contact with and (suspected) exposure to other viral communicable diseases: Secondary | ICD-10-CM | POA: Diagnosis present

## 2019-08-20 DIAGNOSIS — Z66 Do not resuscitate: Secondary | ICD-10-CM | POA: Diagnosis present

## 2019-08-20 DIAGNOSIS — Z794 Long term (current) use of insulin: Secondary | ICD-10-CM

## 2019-08-20 LAB — CMP (CANCER CENTER ONLY)
ALT: 232 U/L — ABNORMAL HIGH (ref 0–44)
AST: 294 U/L (ref 15–41)
Albumin: 2.6 g/dL — ABNORMAL LOW (ref 3.5–5.0)
Alkaline Phosphatase: 875 U/L — ABNORMAL HIGH (ref 38–126)
Anion gap: 10 (ref 5–15)
BUN: 11 mg/dL (ref 8–23)
CO2: 24 mmol/L (ref 22–32)
Calcium: 8.9 mg/dL (ref 8.9–10.3)
Chloride: 103 mmol/L (ref 98–111)
Creatinine: 0.6 mg/dL (ref 0.44–1.00)
GFR, Est AFR Am: >60 mL/min (ref >60)
GFR, Estimated: >60 mL/min (ref >60)
Glucose, Bld: 90 mg/dL (ref 70–99)
Potassium: 4 mmol/L (ref 3.5–5.1)
Sodium: 137 mmol/L (ref 135–145)
Total Bilirubin: 3.8 mg/dL (ref 0.3–1.2)
Total Protein: 6.4 g/dL — ABNORMAL LOW (ref 6.5–8.1)

## 2019-08-20 LAB — URINALYSIS, COMPLETE (UACMP) WITH MICROSCOPIC
Bacteria, UA: NONE SEEN
Glucose, UA: NEGATIVE mg/dL
Hgb urine dipstick: NEGATIVE
Ketones, ur: NEGATIVE mg/dL
Leukocytes,Ua: NEGATIVE
Nitrite: NEGATIVE
Protein, ur: NEGATIVE mg/dL
Specific Gravity, Urine: 1.015 (ref 1.005–1.030)
pH: 6 (ref 5.0–8.0)

## 2019-08-20 LAB — CBC WITH DIFFERENTIAL/PLATELET
Abs Immature Granulocytes: 0.06 10*3/uL (ref 0.00–0.07)
Basophils Absolute: 0 10*3/uL (ref 0.0–0.1)
Basophils Relative: 0 %
Eosinophils Absolute: 0.1 10*3/uL (ref 0.0–0.5)
Eosinophils Relative: 0 %
HCT: 45.7 % (ref 36.0–46.0)
Hemoglobin: 15.3 g/dL — ABNORMAL HIGH (ref 12.0–15.0)
Immature Granulocytes: 1 %
Lymphocytes Relative: 7 %
Lymphs Abs: 0.7 10*3/uL (ref 0.7–4.0)
MCH: 30.1 pg (ref 26.0–34.0)
MCHC: 33.5 g/dL (ref 30.0–36.0)
MCV: 90 fL (ref 80.0–100.0)
Monocytes Absolute: 1 10*3/uL (ref 0.1–1.0)
Monocytes Relative: 9 %
Neutro Abs: 9.4 10*3/uL — ABNORMAL HIGH (ref 1.7–7.7)
Neutrophils Relative %: 83 %
Platelets: 182 10*3/uL (ref 150–400)
RBC: 5.08 MIL/uL (ref 3.87–5.11)
RDW: 15.7 % — ABNORMAL HIGH (ref 11.5–15.5)
WBC: 11.3 10*3/uL — ABNORMAL HIGH (ref 4.0–10.5)
nRBC: 0 % (ref 0.0–0.2)

## 2019-08-20 LAB — CBG MONITORING, ED: Glucose-Capillary: 116 mg/dL — ABNORMAL HIGH (ref 70–99)

## 2019-08-20 MED ORDER — IOHEXOL 300 MG/ML  SOLN
75.0000 mL | Freq: Once | INTRAMUSCULAR | Status: AC | PRN
  Administered 2019-08-20: 75 mL via INTRAVENOUS

## 2019-08-20 MED ORDER — SODIUM CHLORIDE (PF) 0.9 % IJ SOLN
INTRAMUSCULAR | Status: AC
  Administered 2019-08-20: 21:00:00
  Filled 2019-08-20: qty 50

## 2019-08-20 MED ORDER — LEVETIRACETAM IN NACL 1000 MG/100ML IV SOLN
1000.0000 mg | Freq: Once | INTRAVENOUS | Status: AC
  Administered 2019-08-20: 23:00:00 1000 mg via INTRAVENOUS
  Filled 2019-08-20: qty 100

## 2019-08-20 MED ORDER — DILTIAZEM HCL 100 MG IV SOLR: 5.0000 mg/h | INTRAVENOUS | Status: DC

## 2019-08-20 MED ORDER — DILTIAZEM LOAD VIA INFUSION: 10.0000 mg | Freq: Once | INTRAVENOUS | Status: DC

## 2019-08-20 MED ORDER — ONDANSETRON HCL 4 MG/2ML IJ SOLN
INTRAMUSCULAR | Status: AC
  Filled 2019-08-20: qty 4

## 2019-08-20 MED ORDER — SODIUM CHLORIDE 0.9 % IV SOLN
INTRAVENOUS | Status: AC
  Administered 2019-08-20: 13:00:00 via INTRAVENOUS
  Filled 2019-08-20 (×2): qty 250

## 2019-08-20 MED ORDER — DENOSUMAB 120 MG/1.7ML ~~LOC~~ SOLN
SUBCUTANEOUS | Status: AC
  Filled 2019-08-20: qty 1.7

## 2019-08-20 MED ORDER — FULVESTRANT 250 MG/5ML IM SOLN
INTRAMUSCULAR | Status: AC
  Filled 2019-08-20: qty 5

## 2019-08-20 MED ORDER — LORAZEPAM 2 MG/ML IJ SOLN: 1.0000 mg | INTRAMUSCULAR | Status: DC | PRN

## 2019-08-20 MED ORDER — LORAZEPAM 2 MG/ML IJ SOLN
INTRAMUSCULAR | Status: AC
  Filled 2019-08-20: qty 1

## 2019-08-20 MED ORDER — HEPARIN SOD (PORK) LOCK FLUSH 100 UNIT/ML IV SOLN
500.0000 [IU] | Freq: Once | INTRAVENOUS | Status: AC
  Administered 2019-08-20: 500 [IU]
  Filled 2019-08-20: qty 5

## 2019-08-20 MED ORDER — SODIUM CHLORIDE 0.9% FLUSH
10.0000 mL | Freq: Once | INTRAVENOUS | Status: AC
  Administered 2019-08-20: 10 mL
  Filled 2019-08-20: qty 10

## 2019-08-20 MED ORDER — ONDANSETRON HCL 4 MG/2ML IJ SOLN
8.0000 mg | Freq: Once | INTRAMUSCULAR | Status: AC
  Administered 2019-08-20: 13:00:00 8 mg via INTRAVENOUS

## 2019-08-20 MED ORDER — DEXAMETHASONE SODIUM PHOSPHATE 10 MG/ML IJ SOLN
10.0000 mg | Freq: Once | INTRAMUSCULAR | Status: AC
  Administered 2019-08-20: 23:00:00 10 mg via INTRAVENOUS
  Filled 2019-08-20: qty 1

## 2019-08-20 MED ORDER — LORAZEPAM 2 MG/ML IJ SOLN
1.0000 mg | Freq: Once | INTRAMUSCULAR | Status: AC
  Administered 2019-08-20: 1 mg via INTRAVENOUS

## 2019-08-20 NOTE — ED Triage Notes (Signed)
Pt transported from the Cancer center.  [name], [place] reports pt's family reports increasing AMS.  Pt has breast ca with METS to liver, spine, and brain.  Pt is alert and oriented per norm .  [name] PA reports pt would provide different answers to the same question.

## 2019-08-20 NOTE — ED Notes (Signed)
Upon arrival to the ED, pt had what appeared to be a focal seizure that lasted ~3-4 minutes.  Stacey Cox EDP was at bedside.  Ativan 1mg  administered as ordered via PAC.

## 2019-08-20 NOTE — Progress Notes (Unsigned)
Critical lab value given to Dr. Irene Limbo at 1310 Total Bilirubin 3.8, and AST 294

## 2019-08-20 NOTE — ED Provider Notes (Signed)
[facility] DEPT Provider Note   CSN: [id number] Arrival date & time: 08/20/19  1649     History   Chief Complaint Chief Complaint  Patient presents with   Altered Mental Status    HPI Stacey Cox is a 63 y.o. female.     HPI   She presents for evaluation of nausea, vomiting, and altered mental status characterized by difficulty finding words.  She was evaluated today at the oncology day hospital.  She had labs done.  She was felt to have a recent progressive decline, and was sent to the emergency department for further evaluation.  Apparently family members in [place], [place], were contacted today and talked with the oncology providers about possibly moving the patient to [facility] to be closer to them and potentially entering into hospice care program.  She has known metastatic cancer to the brain.  Last CT head, 06/07/2019.  Patient is unable to give any history.  On arrival patient was evaluated by nursing care staff and found to be having seizure.  I was called to the room.  She was alert, following commands, and having right-sided facial twitching.  Right arm was limp and nonmobile.  She was able to move both legs, and left arm.  She was not able to verbalize.   Level 5 caveat-altered mental status  Past Medical History:  Diagnosis Date   Cancer [facility])    Metastatic Breast Cancer    Patient Active Problem List   Diagnosis Date Noted   Acute metabolic encephalopathy [id number]   Type 2 diabetes mellitus with hyperglycemia, without long-term current use of insulin ([place]) 07/23/2019   Hyperglycemia 07/23/2019   Palliative care by specialist    DNR (do not resuscitate)    Expressive aphasia    Brain metastasis ([place]) 01/27/2019   Seizures ([place]) 07/21/2018   Macrocytic anemia 07/21/2018   Thrombocytopenia ([place]) 07/21/2018   Mild renal insufficiency 07/21/2018   Anxiety 07/21/2018   Brain metastases ([place])    Malignant  neoplasm of female breast ([place])    Breast cancer metastasized to brain ([place]) 07/20/2018   Port-A-Cath in place 07/17/2018   Liver metastases ([place]) 07/12/2018   Genetic testing 08/08/2017   Palliative care status    Counseling regarding advance care planning and goals of care 03/25/2017   Palmar plantar erythrodysaesthesia 03/21/2017   Nausea without vomiting 03/21/2017   Bone metastases ([place]) 02/01/2017   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      Home Medications    Prior to Admission medications   Medication Sig Start Date End Date Taking? Authorizing Provider  aspirin EC 81 MG tablet Take 1 tablet (81 mg total) by mouth daily. 08/01/17  Yes [name], MD  dexamethasone (DECADRON) 1 MG tablet Take 1 tablet (1 mg total) by mouth daily with breakfast. 08/13/19  Yes Carmin Muskrat, MD  DULoxetine (CYMBALTA) 60 MG capsule Take 1 capsule (60 mg total) by mouth daily. 07/24/19  Yes [name], MD  feeding supplement, ENSURE ENLIVE, (ENSURE ENLIVE) LIQD Take 237 mLs  by mouth 2 (two) times daily between meals. 07/22/18  Yes Sheikh, Omair Latif, DO  glipiZIDE (GLUCOTROL) 5 MG tablet Take 1 tablet (5 mg total) by mouth 2 (two) times daily before a meal. 07/20/19  Yes Shamleffer, Melanie Crazier, MD  JANUVIA 50 MG tablet TAKE 1 TABLET(50 MG) BY MOUTH DAILY Patient taking differently: Take 50 mg by mouth daily.  08/13/19  Yes [name], MD  levETIRAcetam (KEPPRA) 1000 MG tablet Take 1 tablet (1,000 mg total) by mouth 2 (two) times daily. 03/26/19  Yes Vaslow, Acey Lav, MD  ondansetron (ZOFRAN ODT) 4  MG disintegrating tablet Take 1 tablet (4 mg total) by mouth every 8 (eight) hours as needed for nausea or vomiting. 08/13/19  Yes Carmin Muskrat, MD  pantoprazole (PROTONIX) 40 MG tablet Take 40 mg by mouth daily. 08/01/19  Yes [provider]  potassium chloride (K-DUR) 10 MEQ tablet Take 2 tablets (20 mEq total) by mouth daily for 3 days. 08/17/19 08/20/19 Yes Couture, Cortni S, PA-C  Vitamin D, Ergocalciferol, (DRISDOL) 1.25 MG (50000 UT) CAPS capsule TAKE 1 CAPSULE BY MOUTH 3 TIMES WEEKLY Patient taking differently: Take 50,000 Units by mouth 3 (three) times a week. TAKE 1 CAPSULE BY MOUTH 3 TIMES WEEKLY 02/05/19  Yes [name], MD  chlorhexidine (PERIDEX) 0.12 % solution Use as directed 15 mLs in the mouth or throat 2 (two) times daily as needed (mouth rinse after vomiting). Swish and spit Patient not taking: Reported on 07/20/2019 04/05/19   [name], MD    Family History Family History  Problem Relation Age of Onset   Cancer Mother        breast   Cancer Sister        breast    Social History Social History   Tobacco Use   Smoking status: Current Every Day Smoker    Packs/day: 0.15    Types: Cigarettes    Last attempt to quit: 01/21/2017    Years since quitting: 2.5   Smokeless tobacco: Never Used  Substance Use Topics   Alcohol use: No    Alcohol/week: 0.0 standard drinks   Drug use: No     Allergies   Thorazine [chlorpromazine] and Ribociclib   Review of Systems Review of Systems  Unable to perform ROS: Mental status change     Physical Exam Updated Vital Signs BP (!) 138/96 (BP Location: Right Arm)    Pulse (!) 108    Temp 97.9 F (36.6 C) (Oral)    Resp 18    SpO2 99%   Physical Exam Vitals signs and nursing note reviewed.  Constitutional:      General: She is in acute distress.     Appearance: She is well-developed. She is ill-appearing. She is not toxic-appearing or diaphoretic.  HENT:     Head: Normocephalic and  atraumatic.     Right Ear: External ear normal.     Left Ear: External ear normal.  Eyes:     Conjunctiva/sclera: Conjunctivae normal.     Pupils: Pupils are equal, round, and reactive to light.  Neck:     Musculoskeletal: Normal range of motion and neck supple.     Trachea: Phonation normal.  Cardiovascular:     Rate and Rhythm: Regular rhythm. Tachycardia present.     Heart sounds: Normal heart sounds.  Pulmonary:     Effort: Pulmonary effort is normal. No respiratory distress.     Breath sounds: Normal breath sounds.  Abdominal:  General: There is no distension.     Palpations: Abdomen is soft. There is no mass.     Tenderness: There is no abdominal tenderness.  Musculoskeletal: Normal range of motion.  Skin:    General: Skin is warm and dry.  Neurological:     Mental Status: She is alert.     Motor: No abnormal muscle tone.     Comments: Right sided facial twitching with right lip and jaw chewing motion.  She follows commands accurately.  Psychiatric:     Comments: Nonverbal      ED Treatments / Results  Labs (all labs ordered are listed, but only abnormal results are displayed) Labs Reviewed  CBG MONITORING, ED - Abnormal; Notable for the following components:      Result Value   Glucose-Capillary 116 (*)    All other components within normal limits  SARS CORONAVIRUS 2 (TAT 6-24 HRS)    EKG None     Date: 08/20/19  Rate: 104  Rhythm: sinus tachycardia  QRS Axis: normal  PR and QT Intervals: normal  ST/T Wave abnormalities: normal  PR and QRS Conduction Disutrbances:none     Radiology Ct Head W Or Wo Contrast  Result Date: 08/20/2019 CLINICAL DATA:  Altered mental status. Metastatic breast cancer. EXAM: CT HEAD WITHOUT AND WITH CONTRAST TECHNIQUE: Contiguous axial images were obtained from the base of the skull through the vertex without and with intravenous contrast CONTRAST:  81mL OMNIPAQUE IOHEXOL 300 MG/ML  SOLN COMPARISON:  Head MRI 06/07/2019  FINDINGS: Brain: There is extensive, confluent hypoattenuation throughout the white matter of the left frontal lobe extending into the left temporal lobe, markedly increased from the prior MRI and consistent with worsening edema. New moderately extensive edema is present in the right frontal lobe. There is associated sulcal effacement in these regions without midline shift. There is abnormal enhancement over the left greater than right cerebral convexities which is both nodular and curvilinear and corresponds to known dural metastatic disease. This is better demonstrated by MRI than CT, however there is concern for interval progression with leptomeningeal involvement a possibility given more notable sulcal enhancement today compared to the prior MRI. No acute cortically based infarct is identified. The lateral and third ventricles are partially compressed due to edema. The basilar cisterns are patent. Marked bilateral inferior cerebellar atrophy is again noted. An 11 mm enhancing extra-axial mass in the posterior fossa posterior to the right jugular foramen is unchanged and compatible with a meningioma. A presumed partially calcified meningioma in the right prepontine cistern is unchanged as well. No acute intracranial hemorrhage or extra-axial fluid collection is identified. Vascular: No suspicious noncontrast vascular hyperdensity. Grossly patent major dural venous sinuses. Skull: No destructive skull lesion. Sinuses/Orbits: Visualized paranasal sinuses and mastoid air cells are clear. Orbits are unremarkable. Other: None. IMPRESSION: 1. Extensive left greater than right frontal lobe edema extending into the left temporal lobe, greatly progressed from the prior MRI. Regional mass effect without midline shift. 2. Enhancement over the left greater than right cerebral convexities corresponding to known dural metastases. Contrast-enhanced brain MRI is recommended to evaluate for suspected progression and for the  possibility of leptomeningeal involvement. Electronically Signed   By: Logan Bores M.D.   On: 08/20/2019 21:47    Procedures .Critical Care Performed by: Daleen Bo, MD Authorized by: Daleen Bo, MD   Critical care provider statement:    Critical care time (minutes):  35   Critical care start time:  08/20/2019 5:40 PM  Critical care end time:  08/20/2019 11:21 PM   Critical care time was exclusive of:  Separately billable procedures and treating other patients   Critical care was necessary to treat or prevent imminent or life-threatening deterioration of the following conditions:  CNS failure or compromise   Critical care was time spent personally by me on the following activities:  Blood draw for specimens, development of treatment plan with patient or surrogate, discussions with consultants, evaluation of patient's response to treatment, examination of patient, obtaining history from patient or surrogate, ordering and performing treatments and interventions, ordering and review of laboratory studies, pulse oximetry, re-evaluation of patient's condition, review of old charts and ordering and review of radiographic studies   (including critical care time)  Medications Ordered in ED Medications  LORazepam (ATIVAN) injection 1 mg (has no administration in time range)  LORazepam (ATIVAN) injection 1 mg (1 mg Intravenous Given 08/20/19 1713)  iohexol (OMNIPAQUE) 300 MG/ML solution 75 mL (75 mLs Intravenous Contrast Given 08/20/19 2102)  sodium chloride (PF) 0.9 % injection (  Given by Other 08/20/19 2102)  dexamethasone (DECADRON) injection 10 mg (10 mg Intravenous Given 08/20/19 2255)  levETIRAcetam (KEPPRA) IVPB 1000 mg/100 mL premix (1,000 mg Intravenous New Bag/Given 08/20/19 2259)     Initial Impression / Assessment and Plan / ED Course  I have reviewed the triage vital signs and the nursing notes.  Pertinent labs & imaging results that were available during my care of the patient  were reviewed by me and considered in my medical decision making (see chart for details).  Clinical Course as of Aug 19 2321  Mon Aug 20, 2019  2043 She was given Ativan 1 mg IV at 7:13 PM.  Within seconds during the administration of the Ativan he was noted to have less twitching, then fell asleep.   [EW]  2228 No findings consistent with bilateral frontal edema, and stable chronic metastases and meningioma.  Images reviewed by me  CT Head W or Wo Contrast [EW]  2235 Case discussed with Dr.Feng, on-call for Dr. Irene Limbo.  She recommends admission, steroids, antiepileptics, and she will have Dr. Irene Limbo see the patient, tomorrow.   [EW]  2246 I discussed the situation with a friend of the patient, and then [place].  She is currently staying at the patient's home.  She states that the patient sister was wanting the patient to go to [place], to get hospice care.  Helene Kelp understands the patient requires hospitalization today.   [EW]    Clinical Course User Index [EW] Daleen Bo, MD        Patient Vitals for the past 24 hrs:  BP Temp Temp src Pulse Resp SpO2  08/20/19 2305 (!) 138/96 -- -- (!) 108 18 99 %  08/20/19 2230 (!) 125/98 -- -- (!) 104 13 93 %  08/20/19 2200 109/89 -- -- (!) 104 13 96 %  08/20/19 2100 100/80 -- -- (!) 101 13 96 %  08/20/19 2030 101/75 -- -- (!) 105 13 96 %  08/20/19 1945 (!) 122/93 -- -- 90 11 96 %  08/20/19 1915 113/89 -- -- (!) 104 12 98 %  08/20/19 1845 107/87 -- -- 94 10 98 %  08/20/19 1830 108/82 -- -- 93 12 98 %  08/20/19 1815 109/83 -- -- 99 12 99 %  08/20/19 1800 (!) 115/91 -- -- 100 13 100 %  08/20/19 1745 (!) 123/104 -- -- 94 15 99 %  08/20/19 1730 (!) 119/93 -- -- [name]  105 14 99 %  08/20/19 1715 112/90 -- -- (!) 106 14 95 %  08/20/19 1651 (!) 117/91 97.9 F (36.6 C) Oral (!) 107 14 96 %    10:28 PM Reevaluation with update and discussion. After initial assessment and treatment, an updated evaluation reveals she is now awake and alert but  confused.  She was unable to give any additional history. Daleen Bo   Medical Decision Making: Seizure with known metastatic brain cancer, and worsening brain edema, likely causing the new seizure activity.  Patient improved with single dose of Ativan, and requires admission.  Additional medications ordered, Decadron and Keppra.  No respiratory distress.  Patient appears to need palliative consultation, and transition to comfort care.  Stpehanie Andert was evaluated in Emergency Department on 08/20/2019 for the symptoms described in the history of present illness. She was evaluated in the context of the global COVID-19 pandemic, which necessitated consideration that the patient might be at risk for infection with the SARS-CoV-2 virus that causes COVID-19. Institutional protocols and algorithms that pertain to the evaluation of patients at risk for COVID-19 are in a state of rapid change based on information released by regulatory bodies including the CDC and federal and state organizations. These policies and algorithms were followed during the patient's care in the ED.   CRITICAL CARE-yes Performed by: Daleen Bo   Nursing Notes Reviewed/ Care Coordinated Applicable Imaging Reviewed Interpretation of Laboratory Data incorporated into ED treatment   10:48 PM-Consult complete with hospitalist. Patient case explained and discussed.  He agrees to admit patient for further evaluation and treatment. Call ended at 11:20 PM   Plan: Admit  Final Clinical Impressions(s) / ED Diagnoses   Final diagnoses:  Seizure ([place])  Metastatic cancer ([place])  Brain edema [facility])    ED Discharge Orders    None       Daleen Bo, MD 08/20/19 2322

## 2019-08-20 NOTE — H&P (Signed)
History and Physical    Stacey Cox [id number] DOB: February 18, 1956 DOA: 08/20/2019  Referring MD/NP/PA:   PCP: [name], MD   Patient coming from:  The patient is coming from home.  At baseline, pt is independent for most of ADL.        Chief Complaint: AMS and seizure  HPI: Stacey Cox is a 63 y.o. 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  Patient has AMS, and is unable to provide accurate medical history, therefore, most of the history is obtained by discussing the case with ED physician, per EMS report, and with the nursing staff.  Pt was in cancer center for chemotherapy, and sent to ED due to altered mental status and difficulty finding words. Pt was noted to have right facial twitching and seizure in ED. Her right arm was limp and nonmobile. She moves both legs and left arm. She also had some nausea and vomiting earlier, which seems to have resolved.  She does not seem to have chest pain or abdominal pain.  No diarrhea noted. Per EDP's note. "apparently family members in [place], [place], were contacted today and talked with the oncology providers about possibly moving the patient to [facility] to be closer to them and potentially entering into hospice care program". She was not able to verbalize.  ED Course: pt was found to have WBC 11.3, pending COVID-19 test, electrolytes renal function okay, temperature normal, blood pressure 125/98, tachycardia, oxygen saturation 93 200% on room air.  Patient is admitted to tele bed as inpt. Oncology was consulted -->they will see pt in AM (I forgot doctor's name, Dr. Irene Limbo?)  CT-head showed 1. Extensive left greater than right frontal lobe edema extending into the left temporal lobe, greatly progressed from the prior  MRI. Regional mass effect without midline shift. 2. Enhancement over the left greater than right cerebral convexities corresponding to known dural metastases. Contrast-enhanced brain MRI is recommended to evaluate for suspected progression and for the possibility of leptomeningeal involvement.  Review of Systems: Could not be reviewed due to altered mental status.  Allergy:  Allergies  Allergen Reactions  . Thorazine [Chlorpromazine] Anaphylaxis  . Ribociclib Other (See Comments)    Exfoliative dermatitis    Past Medical History:  Diagnosis Date  . Cancer [facility])    Metastatic Breast Cancer    Past Surgical History:  Procedure Laterality Date  . CHOLECYSTECTOMY    . IR IMAGING GUIDED PORT INSERTION  07/14/2018  . TONSILLECTOMY      Social History:  reports that she has been smoking cigarettes. She has been smoking about 0.15 packs per day. She has never used smokeless tobacco. She reports that she does not drink alcohol or use drugs.  Family History:  Family History  Problem Relation Age of Onset  . Cancer Mother        breast  . Cancer Sister        breast     Prior to Admission medications   Medication Sig Start Date End Date Taking? Authorizing Provider  aspirin EC 81 MG tablet Take 1 tablet (81 mg total) by mouth daily. 08/01/17  Yes [name], MD  dexamethasone (DECADRON) 1 MG tablet Take 1 tablet (1 mg total) by mouth daily with breakfast. 08/13/19  Yes Carmin Muskrat, MD  DULoxetine (CYMBALTA) 60  MG capsule Take 1 capsule (60 mg total) by mouth daily. 07/24/19  Yes [name], MD  feeding supplement, ENSURE ENLIVE, (ENSURE ENLIVE) LIQD Take 237 mLs by mouth 2 (two) times daily between meals. 07/22/18  Yes Sheikh, Omair Latif, DO  glipiZIDE (GLUCOTROL) 5 MG tablet Take 1 tablet (5 mg total) by mouth 2 (two) times daily before a meal. 07/20/19  Yes Shamleffer, Melanie Crazier, MD  JANUVIA 50 MG tablet TAKE 1 TABLET(50 MG) BY MOUTH DAILY Patient taking  differently: Take 50 mg by mouth daily.  08/13/19  Yes [name], MD  levETIRAcetam (KEPPRA) 1000 MG tablet Take 1 tablet (1,000 mg total) by mouth 2 (two) times daily. 03/26/19  Yes Vaslow, Acey Lav, MD  ondansetron (ZOFRAN ODT) 4 MG disintegrating tablet Take 1 tablet (4 mg total) by mouth every 8 (eight) hours as needed for nausea or vomiting. 08/13/19  Yes Carmin Muskrat, MD  pantoprazole (PROTONIX) 40 MG tablet Take 40 mg by mouth daily. 08/01/19  Yes [provider]  potassium chloride (K-DUR) 10 MEQ tablet Take 2 tablets (20 mEq total) by mouth daily for 3 days. 08/17/19 08/20/19 Yes Couture, Cortni S, PA-C  Vitamin D, Ergocalciferol, (DRISDOL) 1.25 MG (50000 UT) CAPS capsule TAKE 1 CAPSULE BY MOUTH 3 TIMES WEEKLY Patient taking differently: Take 50,000 Units by mouth 3 (three) times a week. TAKE 1 CAPSULE BY MOUTH 3 TIMES WEEKLY 02/05/19  Yes [name], MD  chlorhexidine (PERIDEX) 0.12 % solution Use as directed 15 mLs in the mouth or throat 2 (two) times daily as needed (mouth rinse after vomiting). Swish and spit Patient not taking: Reported on 07/20/2019 04/05/19   [name], MD    Physical Exam: Vitals:   08/21/19 0530 08/21/19 0600 08/21/19 0630 08/21/19 0700  BP: 107/71 108/74 116/86 113/86  Pulse: 82 93 82 95  Resp: 11 12 15 11   Temp:      TempSrc:      SpO2: 95% 96% 94% 96%   General: Not in acute distress HEENT:       Eyes: PERRL, EOMI, no scleral icterus.       ENT: No discharge from the ears and nose, no pharynx injection, no tonsillar enlargement.        Neck: No JVD, no bruit, no mass felt. Heme: No neck lymph node enlargement. Cardiac: S1/S2, RRR, No murmurs, No gallops or rubs. Respiratory: No rales, wheezing, rhonchi or rubs. GI: Soft, nondistended, nontender, no rebound pain, no organomegaly, BS present. GU: No hematuria Ext: No pitting leg edema bilaterally. 2+DP/PT pulse bilaterally. Musculoskeletal: No joint deformities, No  joint redness or warmth, no limitation of ROM in spin. Skin: No rashes.  Neuro: confused, knows her own name, not oriented to place and time. Cranial nerves II-XII grossly intact, moves all extremities. Seems have right arm weakness  Normal finger to nose test. Psych: Patient is not psychotic, no suicidal or hemocidal ideation.  Labs on Admission: I have personally reviewed following labs and imaging studies  CBC: Recent Labs  Lab 08/17/19 1133 08/20/19 1126 08/21/19 0633  WBC 9.0 11.3* 7.6  NEUTROABS  --  9.4*  --   HGB 15.1* 15.3* 13.7  HCT 45.7 45.7 42.3  MCV 94.2 90.0 93.0  PLT 172 182 [id number]   Basic Metabolic Panel: Recent Labs  Lab 08/17/19 1133 08/20/19 1126 08/21/19 0633  NA 140 137 137  K 2.9* 4.0 3.8  CL 102 103 104  CO2 27 24 23  GLUCOSE 115* 90 128*  BUN 10 11 13   CREATININE 0.48 0.60 0.44  CALCIUM 9.0 8.9 7.8*   GFR: Estimated Creatinine Clearance: 74.4 mL/min (by C-G formula based on SCr of 0.44 mg/dL). Liver Function Tests: Recent Labs  Lab 08/17/19 1133 08/20/19 1126  AST 281* 294*  ALT 224* 232*  ALKPHOS 747* 875*  BILITOT 4.2* 3.8*  PROT 6.4* 6.4*  ALBUMIN 2.8* 2.6*   No results for input(s): LIPASE, AMYLASE in the last 168 hours. No results for input(s): AMMONIA in the last 168 hours. Coagulation Profile: No results for input(s): INR, PROTIME in the last 168 hours. Cardiac Enzymes: No results for input(s): CKTOTAL, CKMB, CKMBINDEX, TROPONINI in the last 168 hours. BNP (last 3 results) No results for input(s): PROBNP in the last 8760 hours. HbA1C: No results for input(s): HGBA1C in the last 72 hours. CBG: Recent Labs  Lab 08/17/19 1118 08/17/19 1527 08/20/19 1704  GLUCAP 105* 152* 116*   Lipid Profile: No results for input(s): CHOL, HDL, LDLCALC, TRIG, CHOLHDL, LDLDIRECT in the last 72 hours. Thyroid Function Tests: No results for input(s): TSH, T4TOTAL, FREET4, T3FREE, THYROIDAB in the last 72 hours. Anemia Panel: No results for  input(s): VITAMINB12, FOLATE, FERRITIN, TIBC, IRON, RETICCTPCT in the last 72 hours. Urine analysis:    Component Value Date/Time   COLORURINE AMBER (A) 08/20/2019 1543   APPEARANCEUR HAZY (A) 08/20/2019 1543   LABSPEC 1.015 08/20/2019 1543   PHURINE 6.0 08/20/2019 1543   GLUCOSEU NEGATIVE 08/20/2019 1543   HGBUR NEGATIVE 08/20/2019 1543   BILIRUBINUR SMALL (A) 08/20/2019 1543   KETONESUR NEGATIVE 08/20/2019 1543   PROTEINUR NEGATIVE 08/20/2019 1543   NITRITE NEGATIVE 08/20/2019 1543   LEUKOCYTESUR NEGATIVE 08/20/2019 1543   Sepsis Labs: @LABRCNTIP (procalcitonin:4,lacticidven:4) ) Recent Results (from the past 240 hour(s))  Urine culture     Status: None   Collection Time: 08/17/19  2:25 PM   Specimen: Urine, Random  Result Value Ref Range Status   Specimen Description   Final    URINE, RANDOM Performed at [facility], [place] [address] [place], [place] [address]    Special Requests   Final    NONE Performed at [facility], [place] [address] [place], [place] [address]    Culture   Final    Multiple bacterial morphotypes present, none predominant. Suggest appropriate recollection if clinically indicated.   Report Status 08/19/2019 FINAL  Final     Radiological Exams on Admission: Ct Head W Or Wo Contrast  Result Date: 08/20/2019 CLINICAL DATA:  Altered mental status. Metastatic breast cancer. EXAM: CT HEAD WITHOUT AND WITH CONTRAST TECHNIQUE: Contiguous axial images were obtained from the base of the skull through the vertex without and with intravenous contrast CONTRAST:  71mL OMNIPAQUE IOHEXOL 300 MG/ML  SOLN COMPARISON:  Head MRI 06/07/2019 FINDINGS: Brain: There is extensive, confluent hypoattenuation throughout the white matter of the left frontal lobe extending into the left temporal lobe, markedly increased from the prior MRI and consistent with worsening edema. New moderately extensive edema is present in the right frontal lobe. There  is associated sulcal effacement in these regions without midline shift. There is abnormal enhancement over the left greater than right cerebral convexities which is both nodular and curvilinear and corresponds to known dural metastatic disease. This is better demonstrated by MRI than CT, however there is concern for interval progression with leptomeningeal involvement a possibility given more notable sulcal enhancement today compared to the prior MRI. No acute cortically based infarct  is identified. The lateral and third ventricles are partially compressed due to edema. The basilar cisterns are patent. Marked bilateral inferior cerebellar atrophy is again noted. An 11 mm enhancing extra-axial mass in the posterior fossa posterior to the right jugular foramen is unchanged and compatible with a meningioma. A presumed partially calcified meningioma in the right prepontine cistern is unchanged as well. No acute intracranial hemorrhage or extra-axial fluid collection is identified. Vascular: No suspicious noncontrast vascular hyperdensity. Grossly patent major dural venous sinuses. Skull: No destructive skull lesion. Sinuses/Orbits: Visualized paranasal sinuses and mastoid air cells are clear. Orbits are unremarkable. Other: None. IMPRESSION: 1. Extensive left greater than right frontal lobe edema extending into the left temporal lobe, greatly progressed from the prior MRI. Regional mass effect without midline shift. 2. Enhancement over the left greater than right cerebral convexities corresponding to known dural metastases. Contrast-enhanced brain MRI is recommended to evaluate for suspected progression and for the possibility of leptomeningeal involvement. Electronically Signed   By: Logan Bores M.D.   On: 08/20/2019 21:47     EKG: Independently reviewed.  Sinus rhythm, QTC 481, low voltage, tachycardia, LAD, poor R wave progression.   Assessment/Plan Principal Problem:   Acute metabolic encephalopathy  Active Problems:   Bone metastases (HCC)   Liver metastases (HCC)   Breast cancer metastasized to brain (HCC)   Seizures ([place])   Leukocytosis   Diabetes mellitus without complication (HCC)   Tobacco abuse   Acute metabolic encephalopathy: likely due to brain edema and metastasized brain disease.  CT head showed extensive left greater than right frontal lobe edema extending into the left temporal lobe, greatly progressed from the prior MRI. Regional mass effect without midline shift. Oncology was consulted by EDP  -will admit to tele bed as inpt -Frequent neuro check -Decadron 4 mg every 6 hours (patient received 10 mg of Decadron in the ED) -Follow-up oncology's recommendation -Palliative consult  Breast cancer metastasized to brain, bone metastases, live and lung: pt is on chemotherapy per Dr. Irene Limbo -Follow-up with oncology  Seizure -Seizure precaution -When necessary Ativan for seizure -Continue Home medications: Keppra, changed to IV 1000 mg twice daily  Leukocytosis: WBC 11.3. No signs of infection, likely reactive. - follow-up by CBC  Diabetes mellitus without complication ([place]): Last A1c 8.9 on 07/20/19, well controled. Patient is taking Januvia and glipizide at home -SSI   Tobacco abuse: -Nicotine patch    Inpatient status:  # Patient requires inpatient status due to high intensity of service, high risk for further deterioration and high frequency of surveillance required.  I certify that at the point of admission it is my clinical judgment that the patient will require inpatient hospital care spanning beyond 2 midnights from the point of admission.  . 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 Now patient has presenting with AMS and seizure possibly due to brain edema and metastasized brain disease. [name]  The worrisome physical exam findings include AMS and right arm weakness . The initial radiographic and laboratory data are worrisome because CT-head showed extensive left greater than right frontal lobe edema extending into the left temporal lobe. . Current medical needs: please see my assessment and plan . Predictability of an adverse outcome (risk): Patient has multiple comorbidities, now presents with altered mental status, seizure possibly due to brain edema and metastasized brain disease.  Her presentation is highly complicated.  Will  need to be treated in hospital for at least 2 days.         DVT ppx: SQ Lovenox Code Status: Full code  Family Communication: None at bed side.   Disposition Plan:  Anticipate discharge back to previous home environment Consults called:  oncology Admission status:   Inpatient/tele     Date of Service 08/21/2019    [facility]   If 7PM-7AM, please contact night-coverage [email or web address] Password [facility] 08/21/2019, 7:27 AM

## 2019-08-20 NOTE — Patient Instructions (Signed)
Denosumab injection What is this medicine? DENOSUMAB (den oh sue mab) slows bone breakdown. Prolia is used to treat osteoporosis in women after menopause and in men, and in people who are taking corticosteroids for 6 months or more. Xgeva is used to treat a high calcium level due to cancer and to prevent bone fractures and other bone problems caused by multiple myeloma or cancer bone metastases. Xgeva is also used to treat giant cell tumor of the bone. This medicine may be used for other purposes; ask your health care provider or pharmacist if you have questions. COMMON BRAND NAME(S): Prolia, XGEVA What should I tell my health care provider before I take this medicine? They need to know if you have any of these conditions:  dental disease  having surgery or tooth extraction  infection  kidney disease  low levels of calcium or Vitamin D in the blood  malnutrition  on hemodialysis  skin conditions or sensitivity  thyroid or parathyroid disease  an unusual reaction to denosumab, other medicines, foods, dyes, or preservatives  pregnant or trying to get pregnant  breast-feeding How should I use this medicine? This medicine is for injection under the skin. It is given by a health care professional in a hospital or clinic setting. A special MedGuide will be given to you before each treatment. Be sure to read this information carefully each time. For Prolia, talk to your pediatrician regarding the use of this medicine in children. Special care may be needed. For Xgeva, talk to your pediatrician regarding the use of this medicine in children. While this drug may be prescribed for children as young as 13 years for selected conditions, precautions do apply. Overdosage: If you think you have taken too much of this medicine contact a poison control center or emergency room at once. NOTE: This medicine is only for you. Do not share this medicine with others. What if I miss a dose? It is  important not to miss your dose. Call your doctor or health care professional if you are unable to keep an appointment. What may interact with this medicine? Do not take this medicine with any of the following medications:  other medicines containing denosumab This medicine may also interact with the following medications:  medicines that lower your chance of fighting infection  steroid medicines like prednisone or cortisone This list may not describe all possible interactions. Give your health care provider a list of all the medicines, herbs, non-prescription drugs, or dietary supplements you use. Also tell them if you smoke, drink alcohol, or use illegal drugs. Some items may interact with your medicine. What should I watch for while using this medicine? Visit your doctor or health care professional for regular checks on your progress. Your doctor or health care professional may order blood tests and other tests to see how you are doing. Call your doctor or health care professional for advice if you get a fever, chills or sore throat, or other symptoms of a cold or flu. Do not treat yourself. This drug may decrease your body's ability to fight infection. Try to avoid being around people who are sick. You should make sure you get enough calcium and vitamin D while you are taking this medicine, unless your doctor tells you not to. Discuss the foods you eat and the vitamins you take with your health care professional. See your dentist regularly. Brush and floss your teeth as directed. Before you have any dental work done, tell your dentist you are   receiving this medicine. Do not become pregnant while taking this medicine or for 5 months after stopping it. Talk with your doctor or health care professional about your birth control options while taking this medicine. Women should inform their doctor if they wish to become pregnant or think they might be pregnant. There is a potential for serious side  effects to an unborn child. Talk to your health care professional or pharmacist for more information. What side effects may I notice from receiving this medicine? Side effects that you should report to your doctor or health care professional as soon as possible:  allergic reactions like skin rash, itching or hives, swelling of the face, lips, or tongue  bone pain  breathing problems  dizziness  jaw pain, especially after dental work  redness, blistering, peeling of the skin  signs and symptoms of infection like fever or chills; cough; sore throat; pain or trouble passing urine  signs of low calcium like fast heartbeat, muscle cramps or muscle pain; pain, tingling, numbness in the hands or feet; seizures  unusual bleeding or bruising  unusually weak or tired Side effects that usually do not require medical attention (report to your doctor or health care professional if they continue or are bothersome):  constipation  diarrhea  headache  joint pain  loss of appetite  muscle pain  runny nose  tiredness  upset stomach This list may not describe all possible side effects. Call your doctor for medical advice about side effects. You may report side effects to FDA at 1-800-FDA-1088. Where should I keep my medicine? This medicine is only given in a clinic, doctor's office, or other health care setting and will not be stored at home. NOTE: This sheet is a summary. It may not cover all possible information. If you have questions about this medicine, talk to your doctor, pharmacist, or health care provider.  2020 Elsevier/Gold Standard (2018-03-17 16:10:44) Fulvestrant injection What is this medicine? FULVESTRANT (ful VES trant) blocks the effects of estrogen. It is used to treat breast cancer. This medicine may be used for other purposes; ask your health care provider or pharmacist if you have questions. COMMON BRAND NAME(S): FASLODEX What should I tell my health care  provider before I take this medicine? They need to know if you have any of these conditions:  bleeding disorders  liver disease  low blood counts, like low white cell, platelet, or red cell counts  an unusual or allergic reaction to fulvestrant, other medicines, foods, dyes, or preservatives  pregnant or trying to get pregnant  breast-feeding How should I use this medicine? This medicine is for injection into a muscle. It is usually given by a health care professional in a hospital or clinic setting. Talk to your pediatrician regarding the use of this medicine in children. Special care may be needed. Overdosage: If you think you have taken too much of this medicine contact a poison control center or emergency room at once. NOTE: This medicine is only for you. Do not share this medicine with others. What if I miss a dose? It is important not to miss your dose. Call your doctor or health care professional if you are unable to keep an appointment. What may interact with this medicine?  medicines that treat or prevent blood clots like warfarin, enoxaparin, dalteparin, apixaban, dabigatran, and rivaroxaban This list may not describe all possible interactions. Give your health care provider a list of all the medicines, herbs, non-prescription drugs, or dietary supplements you use.   Also tell them if you smoke, drink alcohol, or use illegal drugs. Some items may interact with your medicine. What should I watch for while using this medicine? Your condition will be monitored carefully while you are receiving this medicine. You will need important blood work done while you are taking this medicine. Do not become pregnant while taking this medicine or for at least 1 year after stopping it. Women of child-bearing potential will need to have a negative pregnancy test before starting this medicine. Women should inform their doctor if they wish to become pregnant or think they might be pregnant. There is  a potential for serious side effects to an unborn child. Men should inform their doctors if they wish to father a child. This medicine may lower sperm counts. Talk to your health care professional or pharmacist for more information. Do not breast-feed an infant while taking this medicine or for 1 year after the last dose. What side effects may I notice from receiving this medicine? Side effects that you should report to your doctor or health care professional as soon as possible:  allergic reactions like skin rash, itching or hives, swelling of the face, lips, or tongue  feeling faint or lightheaded, falls  pain, tingling, numbness, or weakness in the legs  signs and symptoms of infection like fever or chills; cough; flu-like symptoms; sore throat  vaginal bleeding Side effects that usually do not require medical attention (report to your doctor or health care professional if they continue or are bothersome):  aches, pains  constipation  diarrhea  headache  hot flashes  nausea, vomiting  pain at site where injected  stomach pain This list may not describe all possible side effects. Call your doctor for medical advice about side effects. You may report side effects to FDA at 1-800-FDA-1088. Where should I keep my medicine? This drug is given in a hospital or clinic and will not be stored at home. NOTE: This sheet is a summary. It may not cover all possible information. If you have questions about this medicine, talk to your doctor, pharmacist, or health care provider.  2020 Elsevier/Gold Standard (2018-02-16 11:34:41)  

## 2019-08-20 NOTE — Telephone Encounter (Signed)
Patient's sister called and stated patient seems to have declined over the weekend. Reports patient is sleeping most of the day, not eating, confused, and unable to walk without assistance. Patient's sister is considering Hospice. Dr. Irene Limbo made aware and stated hospice is not an unreasonable choice, however the patient may have something that is reversible going on like a UTI or increased ammonia levels and stated if the family wishes to pursue treatment to see if patient can improve then that is also reasonable. Patient's sister made aware and stated she will make a decision and call the office back.

## 2019-08-20 NOTE — Progress Notes (Signed)
These preliminary result these preliminary results were noted.  Awaiting final report.

## 2019-08-20 NOTE — Telephone Encounter (Addendum)
Pt. Presents today at symptom management following her lab appointment where she was vomiting. Pt. appears to be weak and disoriented, Left hand edematous, Pt vomited it looked like yellow bile. Pt seen by Sandi Mealy PA who ordered 1 liter of fluids and 8 mg of Zofran after half the bag of fluids Pt appears to be more coherent and nausea and vomiting subsided. Lab results showed elevated AST 294 and elevated bilirubin 3.8 urine collected for UA and Urine culture Per Dr. Carrie Mew PA Pt will be admitted for more observation.

## 2019-08-20 NOTE — ED Notes (Signed)
Please call either Helene Kelp [phone], and Seth Bake [phone]. These are caregivers for the week and friends.

## 2019-08-20 NOTE — ED Notes (Signed)
Patient transported to CT 

## 2019-08-20 NOTE — Progress Notes (Signed)
Symptoms Management Clinic Progress Note   Stacey Cox [id number] [birth year] 63 y.o.  Stacey Cox is managed by Dr. 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 Sullivan Lone and is scheduled to see him on return on 08/24/2019.  She is being transferred to the emergency room for evaluation and management.  The patient's sister Henrene Hawking [phone] lives in [place] and has been contacted and made aware of these plans.   Nausea and vomiting: The patient was given 1 L normal saline along with Zofran 8 mg IV x1.  She was able to eat a small container of chicken noodle soup.  Please see After Visit Summary for patient specific instructions.  Future Appointments  Date Time Provider [place]  08/24/2019 11:00 AM [name], MD [facility] None  08/31/2019  3:20 PM Shamleffer, Melanie Crazier, MD [facility] None  09/14/2019 10:30 AM [facility] None  10/02/2019 10:30 AM Vaslow, Acey Lav, MD [facility] None  10/15/2019 10:30 AM [facility] None  11/14/2019 10:30 AM [facility] None    No orders of the defined types were placed in this encounter.      Subjective:   Patient ID:  Stacey Cox is a 63 y.o. (DOB 12-06-1955) female.  Chief Complaint: No chief complaint on file.   HPI Stacey Cox Is a 63 y.o. female with  a diagnosis of a metastatic breast cancer with bone, liver, and brain metastasis.  She is managed by Dr. Sullivan Lone.  She has most recently been treated with Faslodex and Xgeva.  This provider was asked to see the patient today after she presented for labs and was noted to have dark vomitus.  She reports that she has had ongoing nausea and vomiting despite her antiemetics.  She was recently seen in the emergency room for generalized weakness.  She also had hypoglycemia.  Her morning blood sugar was 66.  She lives alone.  Her sister Henrene Hawking lives in [place] and was contacted regarding the patient's visit today.  There was some conversation regarding transferring the patient's care to [facility] in [place] to see if there are other treatment options available.  There is also been some discussion regarding having the patient move to [facility] to be close to family and transition to hospice care.  Unfortunately she presents today with nausea, difficulty finding words, increasing difficulties with activities of daily living, headaches, and bone pain.  She was seen today with Dr. Sullivan Lone who believes that the patient needs to be evaluated for reversible etiologies for her recent progressive decline.  The patient's sister is agreeable with this.  Labs completed today shows that she has hepatic dysfunction which is stable compared to her most recent labs.  A CBC returned with a WBC of 11.3.  This is up from 9.0 when checked 3 days ago.  Medications: I have reviewed the patient's current medications.  Allergies:  Allergies  Allergen Reactions   Thorazine [Chlorpromazine] Anaphylaxis   Ribociclib Other (See Comments)  Exfoliative dermatitis    Past Medical History:  Diagnosis Date   Cancer ([place])    Metastatic Breast Cancer    Past Surgical History:  Procedure Laterality Date   CHOLECYSTECTOMY     IR IMAGING GUIDED PORT INSERTION  07/14/2018   TONSILLECTOMY      Family History  Problem  Relation Age of Onset   Cancer Mother        breast   Cancer Sister        breast    Social History   Socioeconomic History   Marital status: Single    Spouse name: Not on file   Number of children: Not on file   Years of education: Not on file   Highest education level: Not on file  Occupational History   Not on file  Social Needs   Financial resource strain: Not on file   Food insecurity    Worry: Not on file    Inability: Not on file   Transportation needs    Medical: Not on file    Non-medical: Not on file  Tobacco Use   Smoking status: Current Every Day Smoker    Packs/day: 0.15    Types: Cigarettes    Last attempt to quit: 01/21/2017    Years since quitting: 2.5   Smokeless tobacco: Never Used  Substance and Sexual Activity   Alcohol use: No    Alcohol/week: 0.0 standard drinks   Drug use: No   Sexual activity: Never  Lifestyle   Physical activity    Days per week: Not on file    Minutes per session: Not on file   Stress: Not on file  Relationships   Social connections    Talks on phone: Not on file    Gets together: Not on file    Attends religious service: Not on file    Active member of club or organization: Not on file    Attends meetings of clubs or organizations: Not on file    Relationship status: Not on file   Intimate partner violence    Fear of current or ex partner: No    Emotionally abused: No    Physically abused: No    Forced sexual activity: No  Other Topics Concern   Not on file  Social History Narrative   Lives alone. 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 Has phobia of the medical system.    Past Medical History, Surgical history, Social history, and Family history were reviewed and updated as appropriate.   Please see review of systems for further details on the patient's review from today.   Review of Systems:  Review of Systems  Constitutional: Positive for fatigue. Negative for appetite change, chills,  diaphoresis and fever.  HENT: Negative for dental problem, mouth sores and trouble swallowing.   Respiratory: Negative for cough, chest tightness and shortness of breath.   Cardiovascular: Negative for chest pain and palpitations.  Gastrointestinal: Positive for nausea and vomiting. Negative for constipation and diarrhea.  Neurological: Positive for weakness. Negative for dizziness, syncope and headaches.    Objective:   Physical Exam:  There were no vitals taken for this visit. ECOG: 2   Physical Exam Constitutional:      General: She is not in acute distress.    Appearance: She is not diaphoretic.  HENT:     Head: Normocephalic and atraumatic.  Cardiovascular:     Rate and Rhythm: Normal rate and regular rhythm.     Heart sounds: Normal heart  sounds. No murmur. No friction rub. No gallop.   Pulmonary:     Effort: Pulmonary effort is normal. No respiratory distress.     Breath sounds: Normal breath sounds. No wheezing or rales.  Skin:    General: Skin is warm and dry.     Findings: No erythema or rash.  Neurological:     Mental Status: She is alert.     Gait: Gait abnormal.     Comments: The patient is ambulating with the use of a wheelchair.  Her cognitive functions are slow today.  She is having difficulty finding words.  Her answers are often conflicting.     Lab Review:     Component Value Date/Time   NA 140 08/17/2019 1133   NA 139 11/24/2017 1340   K 2.9 (L) 08/17/2019 1133   K 3.7 11/24/2017 1340   CL 102 08/17/2019 1133   CO2 27 08/17/2019 1133   CO2 25 11/24/2017 1340   GLUCOSE 115 (H) 08/17/2019 1133   GLUCOSE 83 11/24/2017 1340   BUN 10 08/17/2019 1133   BUN 9.7 11/24/2017 1340   CREATININE 0.48 08/17/2019 1133   CREATININE 0.71 07/12/2019 1122   CREATININE 1.2 (H) 11/24/2017 1340   CALCIUM 9.0 08/17/2019 1133   CALCIUM 9.3 11/24/2017 1340   PROT 6.4 (L) 08/17/2019 1133   PROT 7.1 11/24/2017 1340   ALBUMIN 2.8 (L) 08/17/2019 1133   ALBUMIN 3.8  11/24/2017 1340   AST 281 (H) 08/17/2019 1133   AST 67 (H) 07/12/2019 1122   AST 29 11/24/2017 1340   ALT 224 (H) 08/17/2019 1133   ALT 97 (H) 07/12/2019 1122   ALT 35 11/24/2017 1340   ALKPHOS 747 (H) 08/17/2019 1133   ALKPHOS 73 11/24/2017 1340   BILITOT 4.2 (H) 08/17/2019 1133   BILITOT 0.5 07/12/2019 1122   BILITOT 0.40 11/24/2017 1340   GFRNONAA >60 08/17/2019 1133   GFRNONAA >60 07/12/2019 1122   GFRAA >60 08/17/2019 1133   GFRAA >60 07/12/2019 1122       Component Value Date/Time   WBC 9.0 08/17/2019 1133   RBC 4.85 08/17/2019 1133   HGB 15.1 (H) 08/17/2019 1133   HGB 12.7 04/12/2019 0901   HGB 13.9 11/24/2017 1340   HCT 45.7 08/17/2019 1133   HCT 42.0 11/24/2017 1340   PLT 172 08/17/2019 1133   PLT 198 04/12/2019 0901   PLT 212 11/24/2017 1340   MCV 94.2 08/17/2019 1133   MCV 97.0 11/24/2017 1340   MCH 31.1 08/17/2019 1133   MCHC 33.0 08/17/2019 1133   RDW 15.6 (H) 08/17/2019 1133   RDW 14.1 11/24/2017 1340   LYMPHSABS 0.7 08/13/2019 1128   LYMPHSABS 1.4 11/24/2017 1340   MONOABS 0.8 08/13/2019 1128   MONOABS 0.3 11/24/2017 1340   EOSABS 0.1 08/13/2019 1128   EOSABS 0.1 11/24/2017 1340   BASOSABS 0.0 08/13/2019 1128   BASOSABS 0.1 11/24/2017 1340   -------------------------------  Imaging from last 24 hours (if applicable):  Radiology interpretation: No results found.      This patient was seen with Dr. Irene Limbo with my treatment plan reviewed with him. He expressed agreement with my medical management of this patient.  ADDENDUM  .Patient was Personally and independently interviewed, examined and relevant elements of the history of present illness were reviewed in details and an assessment and plan was created. All elements of the patient's history of present illness , assessment and plan were discussed in details with Stacey Mealy PA-C. The above documentation reflects  our combined findings assessment and plan.  Sullivan Lone MD MS

## 2019-08-21 ENCOUNTER — Telehealth: Payer: Self-pay | Admitting: Medical

## 2019-08-21 ENCOUNTER — Encounter: Payer: Self-pay | Admitting: *Deleted

## 2019-08-21 ENCOUNTER — Encounter (HOSPITAL_COMMUNITY): Payer: Self-pay | Admitting: Oncology

## 2019-08-21 DIAGNOSIS — Z7189 Other specified counseling: Secondary | ICD-10-CM

## 2019-08-21 DIAGNOSIS — M7989 Other specified soft tissue disorders: Secondary | ICD-10-CM | POA: Diagnosis present

## 2019-08-21 DIAGNOSIS — R569 Unspecified convulsions: Secondary | ICD-10-CM | POA: Insufficient documentation

## 2019-08-21 DIAGNOSIS — C7951 Secondary malignant neoplasm of bone: Secondary | ICD-10-CM | POA: Diagnosis present

## 2019-08-21 DIAGNOSIS — C7949 Secondary malignant neoplasm of other parts of nervous system: Secondary | ICD-10-CM | POA: Diagnosis not present

## 2019-08-21 DIAGNOSIS — C787 Secondary malignant neoplasm of liver and intrahepatic bile duct: Secondary | ICD-10-CM | POA: Diagnosis present

## 2019-08-21 DIAGNOSIS — Z720 Tobacco use: Secondary | ICD-10-CM | POA: Diagnosis present

## 2019-08-21 DIAGNOSIS — G40909 Epilepsy, unspecified, not intractable, without status epilepticus: Secondary | ICD-10-CM | POA: Diagnosis present

## 2019-08-21 DIAGNOSIS — F329 Major depressive disorder, single episode, unspecified: Secondary | ICD-10-CM | POA: Diagnosis present

## 2019-08-21 DIAGNOSIS — Z66 Do not resuscitate: Secondary | ICD-10-CM | POA: Diagnosis present

## 2019-08-21 DIAGNOSIS — Z87892 Personal history of anaphylaxis: Secondary | ICD-10-CM | POA: Diagnosis not present

## 2019-08-21 DIAGNOSIS — R627 Adult failure to thrive: Secondary | ICD-10-CM | POA: Diagnosis present

## 2019-08-21 DIAGNOSIS — E119 Type 2 diabetes mellitus without complications: Secondary | ICD-10-CM

## 2019-08-21 DIAGNOSIS — G9341 Metabolic encephalopathy: Secondary | ICD-10-CM

## 2019-08-21 DIAGNOSIS — E559 Vitamin D deficiency, unspecified: Secondary | ICD-10-CM | POA: Diagnosis present

## 2019-08-21 DIAGNOSIS — Z79899 Other long term (current) drug therapy: Secondary | ICD-10-CM | POA: Diagnosis not present

## 2019-08-21 DIAGNOSIS — C50919 Malignant neoplasm of unspecified site of unspecified female breast: Secondary | ICD-10-CM

## 2019-08-21 DIAGNOSIS — Z9221 Personal history of antineoplastic chemotherapy: Secondary | ICD-10-CM | POA: Diagnosis not present

## 2019-08-21 DIAGNOSIS — C7931 Secondary malignant neoplasm of brain: Secondary | ICD-10-CM

## 2019-08-21 DIAGNOSIS — R4701 Aphasia: Secondary | ICD-10-CM | POA: Diagnosis present

## 2019-08-21 DIAGNOSIS — Z20828 Contact with and (suspected) exposure to other viral communicable diseases: Secondary | ICD-10-CM | POA: Diagnosis present

## 2019-08-21 DIAGNOSIS — C7800 Secondary malignant neoplasm of unspecified lung: Secondary | ICD-10-CM | POA: Diagnosis present

## 2019-08-21 DIAGNOSIS — Z794 Long term (current) use of insulin: Secondary | ICD-10-CM | POA: Diagnosis not present

## 2019-08-21 DIAGNOSIS — Z515 Encounter for palliative care: Secondary | ICD-10-CM | POA: Diagnosis not present

## 2019-08-21 DIAGNOSIS — C50912 Malignant neoplasm of unspecified site of left female breast: Secondary | ICD-10-CM | POA: Diagnosis present

## 2019-08-21 DIAGNOSIS — Z7982 Long term (current) use of aspirin: Secondary | ICD-10-CM | POA: Diagnosis not present

## 2019-08-21 DIAGNOSIS — C799 Secondary malignant neoplasm of unspecified site: Secondary | ICD-10-CM | POA: Insufficient documentation

## 2019-08-21 DIAGNOSIS — F17210 Nicotine dependence, cigarettes, uncomplicated: Secondary | ICD-10-CM | POA: Diagnosis present

## 2019-08-21 DIAGNOSIS — G936 Cerebral edema: Secondary | ICD-10-CM | POA: Diagnosis present

## 2019-08-21 DIAGNOSIS — Z6829 Body mass index (BMI) 29.0-29.9, adult: Secondary | ICD-10-CM | POA: Diagnosis not present

## 2019-08-21 LAB — CBC
HCT: 42.3 % (ref 36.0–46.0)
Hemoglobin: 13.7 g/dL (ref 12.0–15.0)
MCH: 30.1 pg (ref 26.0–34.0)
MCHC: 32.4 g/dL (ref 30.0–36.0)
MCV: 93 fL (ref 80.0–100.0)
Platelets: 159 10*3/uL (ref 150–400)
RBC: 4.55 MIL/uL (ref 3.87–5.11)
RDW: 15.9 % — ABNORMAL HIGH (ref 11.5–15.5)
WBC: 7.6 10*3/uL (ref 4.0–10.5)
nRBC: 0 % (ref 0.0–0.2)

## 2019-08-21 LAB — CBG MONITORING, ED
Glucose-Capillary: 132 mg/dL — ABNORMAL HIGH (ref 70–99)
Glucose-Capillary: 150 mg/dL — ABNORMAL HIGH (ref 70–99)

## 2019-08-21 LAB — GLUCOSE, CAPILLARY
Glucose-Capillary: 153 mg/dL — ABNORMAL HIGH (ref 70–99)
Glucose-Capillary: 169 mg/dL — ABNORMAL HIGH (ref 70–99)

## 2019-08-21 LAB — BASIC METABOLIC PANEL
Anion gap: 10 (ref 5–15)
BUN: 13 mg/dL (ref 8–23)
CO2: 23 mmol/L (ref 22–32)
Calcium: 7.8 mg/dL — ABNORMAL LOW (ref 8.9–10.3)
Chloride: 104 mmol/L (ref 98–111)
Creatinine, Ser: 0.44 mg/dL (ref 0.44–1.00)
GFR calc Af Amer: >60 mL/min (ref >60)
GFR calc non Af Amer: >60 mL/min (ref >60)
Glucose, Bld: 128 mg/dL — ABNORMAL HIGH (ref 70–99)
Potassium: 3.8 mmol/L (ref 3.5–5.1)
Sodium: 137 mmol/L (ref 135–145)

## 2019-08-21 LAB — SARS CORONAVIRUS 2 (TAT 6-24 HRS): SARS Coronavirus 2: NEGATIVE

## 2019-08-21 LAB — HIV ANTIBODY (ROUTINE TESTING W REFLEX): HIV Screen 4th Generation wRfx: NONREACTIVE — AB

## 2019-08-21 MED ORDER — LEVETIRACETAM IN NACL 1000 MG/100ML IV SOLN
1000.0000 mg | Freq: Two times a day (BID) | INTRAVENOUS | Status: DC
  Administered 2019-08-21 – 2019-08-22 (×3): 1000 mg via INTRAVENOUS
  Filled 2019-08-21 (×3): qty 100

## 2019-08-21 MED ORDER — DEXAMETHASONE SODIUM PHOSPHATE 10 MG/ML IJ SOLN
6.0000 mg | Freq: Four times a day (QID) | INTRAMUSCULAR | Status: DC
  Administered 2019-08-21 – 2019-08-24 (×11): 6 mg via INTRAVENOUS
  Filled 2019-08-21 (×12): qty 1

## 2019-08-21 MED ORDER — ORAL CARE MOUTH RINSE
15.0000 mL | Freq: Two times a day (BID) | OROMUCOSAL | Status: DC
  Administered 2019-08-22 – 2019-08-27 (×10): 15 mL via OROMUCOSAL

## 2019-08-21 MED ORDER — ACETAMINOPHEN 650 MG RE SUPP: 650.0000 mg | Freq: Four times a day (QID) | RECTAL | Status: DC | PRN

## 2019-08-21 MED ORDER — NICOTINE 21 MG/24HR TD PT24
21.0000 mg | MEDICATED_PATCH | Freq: Every day | TRANSDERMAL | Status: DC
  Administered 2019-08-21 – 2019-08-27 (×6): 21 mg via TRANSDERMAL
  Filled 2019-08-21 (×6): qty 1

## 2019-08-21 MED ORDER — CHLORHEXIDINE GLUCONATE CLOTH 2 % EX PADS
6.0000 | MEDICATED_PAD | Freq: Every day | CUTANEOUS | Status: DC
  Administered 2019-08-21 – 2019-08-27 (×5): 6 via TOPICAL

## 2019-08-21 MED ORDER — INSULIN ASPART 100 UNIT/ML ~~LOC~~ SOLN
0.0000 [IU] | Freq: Three times a day (TID) | SUBCUTANEOUS | Status: DC
  Administered 2019-08-21: 2 [IU] via SUBCUTANEOUS
  Administered 2019-08-21: 08:00:00 1 [IU] via SUBCUTANEOUS
  Administered 2019-08-22: 2 [IU] via SUBCUTANEOUS
  Administered 2019-08-22 – 2019-08-23 (×3): 3 [IU] via SUBCUTANEOUS
  Administered 2019-08-23: 5 [IU] via SUBCUTANEOUS
  Administered 2019-08-24: 2 [IU] via SUBCUTANEOUS
  Administered 2019-08-25: 12:00:00 3 [IU] via SUBCUTANEOUS
  Filled 2019-08-21: qty 0.09

## 2019-08-21 MED ORDER — FAMOTIDINE IN NACL 20-0.9 MG/50ML-% IV SOLN
20.0000 mg | Freq: Two times a day (BID) | INTRAVENOUS | Status: DC
  Administered 2019-08-21 – 2019-08-22 (×4): 20 mg via INTRAVENOUS
  Filled 2019-08-21 (×4): qty 50

## 2019-08-21 MED ORDER — ONDANSETRON HCL 4 MG/2ML IJ SOLN
4.0000 mg | Freq: Four times a day (QID) | INTRAMUSCULAR | Status: DC | PRN
  Administered 2019-08-25: 4 mg via INTRAVENOUS
  Filled 2019-08-21: qty 2

## 2019-08-21 MED ORDER — DEXAMETHASONE SODIUM PHOSPHATE 4 MG/ML IJ SOLN
4.0000 mg | Freq: Four times a day (QID) | INTRAMUSCULAR | Status: DC
  Administered 2019-08-21: 4 mg via INTRAVENOUS
  Filled 2019-08-21: qty 1

## 2019-08-21 MED ORDER — ONDANSETRON HCL 4 MG PO TABS: 4.0000 mg | ORAL_TABLET | Freq: Four times a day (QID) | ORAL | Status: DC | PRN

## 2019-08-21 MED ORDER — INSULIN ASPART 100 UNIT/ML ~~LOC~~ SOLN
0.0000 [IU] | Freq: Every day | SUBCUTANEOUS | Status: DC
  Administered 2019-08-24: 21:00:00 3 [IU] via SUBCUTANEOUS
  Filled 2019-08-21: qty 0.05

## 2019-08-21 MED ORDER — ENOXAPARIN SODIUM 40 MG/0.4ML ~~LOC~~ SOLN
40.0000 mg | Freq: Every day | SUBCUTANEOUS | Status: DC
  Administered 2019-08-21 – 2019-08-25 (×5): 40 mg via SUBCUTANEOUS
  Filled 2019-08-21 (×7): qty 0.4

## 2019-08-21 MED ORDER — ACETAMINOPHEN 325 MG PO TABS: 650.0000 mg | ORAL_TABLET | Freq: Four times a day (QID) | ORAL | Status: DC | PRN

## 2019-08-21 NOTE — ED Notes (Signed)
ED TO INPATIENT HANDOFF REPORT  ED Nurse Name and Phone #: [phone] Celeste   S Name/Age/Gender Stacey Cox 63 y.o. female Room/Bed: WA06/WA06  Code Status   Code Status: Full Code  Home/SNF/Other Home Patient oriented to: self, place, time and situation Is this baseline? Yes   Triage Complete: Triage complete  Chief Complaint Altered Mental Status  Triage Note Pt transported from the Cancer center.  Stacey, Cox reports pt's family reports increasing AMS.  Pt has breast ca with METS to liver, spine, and brain.  Pt is alert and oriented per norm .  [name] PA reports pt would provide different answers to the same question.     Allergies Allergies  Allergen Reactions  . Thorazine [Chlorpromazine] Anaphylaxis  . Ribociclib Other (See Comments)    Exfoliative dermatitis    Level of Care/Admitting Diagnosis ED Disposition    ED Disposition Condition Comment   Admit  Hospital Area: [facility] [place] [[id number]]  Level of Care: Telemetry [5]  Admit to tele based on following criteria: Other see comments  Comments: seizure  Covid Evaluation: Asymptomatic Screening Protocol (No Symptoms)  Diagnosis: Acute metabolic encephalopathy [id number]  Admitting Physician: Ivor Costa [place]  Attending Physician: Ivor Costa [[id number]]  Estimated length of stay: past midnight tomorrow  Certification:: I certify this patient will need inpatient services for at least 2 midnights  PT Class (Do Not Modify): Inpatient [101]  PT Acc Code (Do Not Modify): Private [1]       B Medical/Surgery History Past Medical History:  Diagnosis Date  . Cancer [facility])    Metastatic Breast Cancer   Past Surgical History:  Procedure Laterality Date  . CHOLECYSTECTOMY    . IR IMAGING GUIDED PORT INSERTION  07/14/2018  . TONSILLECTOMY       A IV Location/Drains/Wounds Patient Lines/Drains/Airways Status   Active Line/Drains/Airways    Name:   Placement date:   Placement time:   Site:    Days:   Implanted Port 07/14/18 Right Chest   07/14/18    1552    Chest   403          Intake/Output Last 24 hours  Intake/Output Summary (Last 24 hours) at 08/21/2019 1442 Last data filed at 08/20/2019 2328 Gross per 24 hour  Intake 100 ml  Output -  Net 100 ml    Labs/Imaging Results for orders placed or performed during the hospital encounter of 08/20/19 (from the past 48 hour(s))  CBG monitoring, ED     Status: Abnormal   Collection Time: 08/20/19  5:04 PM  Result Value Ref Range   Glucose-Capillary 116 (H) 70 - 99 mg/dL  SARS CORONAVIRUS 2 (TAT 6-24 HRS) Nasopharyngeal Nasopharyngeal Swab     Status: None   Collection Time: 08/20/19 10:59 PM   Specimen: Nasopharyngeal Swab  Result Value Ref Range   SARS Coronavirus 2 NEGATIVE NEGATIVE    Comment: (NOTE) SARS-CoV-2 target nucleic acids are NOT DETECTED. The SARS-CoV-2 RNA is generally detectable in upper and lower respiratory specimens during the acute phase of infection. Negative results do not preclude SARS-CoV-2 infection, do not rule out co-infections with other pathogens, and should not be used as the sole basis for treatment or other patient management decisions. Negative results must be combined with clinical observations, patient history, and epidemiological information. The expected result is Negative. Fact Sheet for Patients: SugarRoll.be Fact Sheet for Healthcare Providers: https://www.woods-mathews.com/ This test is not yet approved or cleared by the [place] FDA and  has  been authorized for detection and/or diagnosis of SARS-CoV-2 by FDA under an Emergency Use Authorization (EUA). This EUA will remain  in effect (meaning this test can be used) for the duration of the COVID-19 declaration under Section 56 4(b)(1) of the Act, 21 U.S.C. section 360bbb-3(b)(1), unless the authorization is terminated or revoked sooner. Performed at [facility], [place] [address] [place], [place] [address]   HIV Antibody  (Routine Testing)     Status: Abnormal   Collection Time: 08/21/19  6:33 AM  Result Value Ref Range   HIV Screen 4th Generation wRfx Non Reactive (A) Non Reactive    Comment: (NOTE) Performed At: [facility] [place], [place] [id number] Rush Farmer MD [id number]   Basic metabolic panel     Status: Abnormal   Collection Time: 08/21/19  6:33 AM  Result Value Ref Range   Sodium 137 135 - 145 mmol/L   Potassium 3.8 3.5 - 5.1 mmol/L   Chloride 104 98 - 111 mmol/L   CO2 23 22 - 32 mmol/L   Glucose, Bld 128 (H) 70 - 99 mg/dL   BUN 13 8 - 23 mg/dL   Creatinine, Ser 0.44 0.44 - 1.00 mg/dL   Calcium 7.8 (L) 8.9 - 10.3 mg/dL   GFR calc non Af Amer >60 >60 mL/min   GFR calc Af Amer >60 >60 mL/min   Anion gap 10 5 - 15    Comment: Performed at [facility], [place] [address], [place], [place] [address]  CBC     Status: Abnormal   Collection Time: 08/21/19  6:33 AM  Result Value Ref Range   WBC 7.6 4.0 - 10.5 K/uL   RBC 4.55 3.87 - 5.11 MIL/uL   Hemoglobin 13.7 12.0 - 15.0 g/dL   HCT 42.3 36.0 - 46.0 %   MCV 93.0 80.0 - 100.0 fL   MCH 30.1 26.0 - 34.0 pg   MCHC 32.4 30.0 - 36.0 g/dL   RDW 15.9 (H) 11.5 - 15.5 %   Platelets 159 150 - 400 K/uL   nRBC 0.0 0.0 - 0.2 %    Comment: Performed at [facility], [place] [address], [place], [place] [address]  CBG monitoring, ED     Status: Abnormal   Collection Time: 08/21/19  7:34 AM  Result Value Ref Range   Glucose-Capillary 132 (H) 70 - 99 mg/dL   Comment 1 Notify RN    Comment 2 Document in Chart   CBG monitoring, ED     Status: Abnormal   Collection Time: 08/21/19  2:14 PM  Result Value Ref Range   Glucose-Capillary 150 (H) 70 - 99 mg/dL   Comment 1 Notify RN    Comment 2 Document in Chart    Ct Head W Or Wo Contrast  Result Date: 08/20/2019 CLINICAL DATA:  Altered mental status. Metastatic breast cancer. EXAM: CT HEAD  WITHOUT AND WITH CONTRAST TECHNIQUE: Contiguous axial images were obtained from the base of the skull through the vertex without and with intravenous contrast CONTRAST:  78mL OMNIPAQUE IOHEXOL 300 MG/ML  SOLN COMPARISON:  Head MRI 06/07/2019 FINDINGS: Brain: There is extensive, confluent hypoattenuation throughout the white matter of the left frontal lobe extending into the left temporal lobe, markedly increased from the prior MRI and consistent with worsening edema. New moderately extensive edema is present in the right frontal lobe. There is associated sulcal effacement in these regions without midline shift. There is abnormal enhancement over the  left greater than right cerebral convexities which is both nodular and curvilinear and corresponds to known dural metastatic disease. This is better demonstrated by MRI than CT, however there is concern for interval progression with leptomeningeal involvement a possibility given more notable sulcal enhancement today compared to the prior MRI. No acute cortically based infarct is identified. The lateral and third ventricles are partially compressed due to edema. The basilar cisterns are patent. Marked bilateral inferior cerebellar atrophy is again noted. An 11 mm enhancing extra-axial mass in the posterior fossa posterior to the right jugular foramen is unchanged and compatible with a meningioma. A presumed partially calcified meningioma in the right prepontine cistern is unchanged as well. No acute intracranial hemorrhage or extra-axial fluid collection is identified. Vascular: No suspicious noncontrast vascular hyperdensity. Grossly patent major dural venous sinuses. Skull: No destructive skull lesion. Sinuses/Orbits: Visualized paranasal sinuses and mastoid air cells are clear. Orbits are unremarkable. Other: None. IMPRESSION: 1. Extensive left greater than right frontal lobe edema extending into the left temporal lobe, greatly progressed from the prior MRI. Regional  mass effect without midline shift. 2. Enhancement over the left greater than right cerebral convexities corresponding to known dural metastases. Contrast-enhanced brain MRI is recommended to evaluate for suspected progression and for the possibility of leptomeningeal involvement. Electronically Signed   By: Logan Bores M.D.   On: 08/20/2019 21:47    Pending Labs Unresulted Labs (From admission, onward)   None      Vitals/Pain Today's Vitals   08/21/19 0900 08/21/19 1000 08/21/19 1100 08/21/19 1315  BP: 115/88 117/90 (!) 117/93 (!) 125/93  Pulse: 98 90 93 83  Resp: 17 13 12 12   Temp:      TempSrc:      SpO2: 94% 95% 98% 96%  PainSc:        Isolation Precautions No active isolations  Medications Medications  LORazepam (ATIVAN) injection 1 mg (has no administration in time range)  levETIRAcetam (KEPPRA) IVPB 1000 mg/100 mL premix (1,000 mg Intravenous New Bag/Given 08/21/19 1059)  famotidine (PEPCID) IVPB 20 mg premix (20 mg Intravenous New Bag/Given 08/21/19 1100)  nicotine (NICODERM CQ - dosed in mg/24 hours) patch 21 mg (21 mg Transdermal Patch Applied 08/21/19 1105)  enoxaparin (LOVENOX) injection 40 mg (has no administration in time range)  acetaminophen (TYLENOL) tablet 650 mg (has no administration in time range)    Or  acetaminophen (TYLENOL) suppository 650 mg (has no administration in time range)  ondansetron (ZOFRAN) tablet 4 mg (has no administration in time range)    Or  ondansetron (ZOFRAN) injection 4 mg (has no administration in time range)  insulin aspart (novoLOG) injection 0-9 Units (1 Units Subcutaneous Given 08/21/19 0825)  insulin aspart (novoLOG) injection 0-5 Units (has no administration in time range)  dexamethasone (DECADRON) injection 6 mg (6 mg Intravenous Given 08/21/19 1222)  LORazepam (ATIVAN) injection 1 mg (1 mg Intravenous Given 08/20/19 1713)  iohexol (OMNIPAQUE) 300 MG/ML solution 75 mL (75 mLs Intravenous Contrast Given 08/20/19 2102)  sodium  chloride (PF) 0.9 % injection (  Given by Other 08/20/19 2102)  dexamethasone (DECADRON) injection 10 mg (10 mg Intravenous Given 08/20/19 2255)  levETIRAcetam (KEPPRA) IVPB 1000 mg/100 mL premix (0 mg Intravenous Stopped 08/20/19 2328)    Mobility non-ambulatory High fall risk   Focused Assessments    R Recommendations: See Admitting Provider Note  Report given to:   Additional Notes:

## 2019-08-21 NOTE — ED Notes (Addendum)
Patient moved into a hospital bed for comfort. Seizure pads placed on hospital bed. Will continue to monitor patient.

## 2019-08-21 NOTE — Consult Note (Addendum)
[place]  Telephone:([phone] Fax:(336) [phone]   MEDICAL ONCOLOGY - CONSULTATION  Referral MD: Dr. Barb Merino  Reason for Referral: Metastatic breast cancer, worsening brain metastases  HPI: Stacey Cox is a 63 year old female with a past medical history significant for metastatic breast cancer with metastases to the lung, liver, brain, and bone, diabetes, depression, history of seizures, tobacco abuse, and expressive aphasia.  She was seen at the cancer center on the day of admission for chemotherapy.  She was sent to the emergency room due to altered mental status and difficulty finding her words.  In the ER, she was noted to have right facial twitching and also had a seizure in the emergency room.  Right arm was limp and nonmobile.  Labs performed at the [facility] on the day of admission showed a white blood cell count of 11.3, hemoglobin 15.3 and a normal platelet count of 182,000.  CMET was significant for an albumin of 2.6, AST 294, ALT 232, alkaline phosphatase 875, total bilirubin 3.8.  She had a CT of the head with and without contrast performed in the emergency room which showed extensive left greater than right frontal lobe edema extending into the left temporal lobe, greatly progressed from the prior MRI, regional mass-effect without midline shift, enhancement over the left greater than right cerebral convexities corresponding to known dural metastases.  An MRI of the brain is recommended for suspect progression and for the possibility of leptomeningeal involvement.  She has been started on dexamethasone and IV Keppra.  She also has Ativan ordered as needed for seizures.  I saw the patient in the emergency room this morning.  She is currently awaiting bed placement.  She recently received Ativan and is somewhat sedated.  She is able to open her eyes and answer a few questions.  She knows that she is at [facility].  She denied pain.  Unable to obtain  comprehensive review of systems due to patient's sedation.  Medical oncology was asked see the patient to make recommendations regarding her metastatic breast cancer and worsening brain metastases.   Past Medical History:  Diagnosis Date  . Cancer [facility])    Metastatic Breast Cancer  :  Past Surgical History:  Procedure Laterality Date  . CHOLECYSTECTOMY    . IR IMAGING GUIDED PORT INSERTION  07/14/2018  . TONSILLECTOMY    :  Current Facility-Administered Medications  Medication Dose Route Frequency Provider Last Rate Last Dose  . acetaminophen (TYLENOL) tablet 650 mg  650 mg Oral Q6H PRN Ivor Costa, MD       Or  . acetaminophen (TYLENOL) suppository 650 mg  650 mg Rectal Q6H PRN Ivor Costa, MD      . dexamethasone (DECADRON) injection 4 mg  4 mg Intravenous Q6H Ivor Costa, MD   4 mg at 08/21/19 9678  . enoxaparin (LOVENOX) injection 40 mg  40 mg Subcutaneous Daily Ivor Costa, MD      . famotidine (PEPCID) IVPB 20 mg premix  20 mg Intravenous Q12H Ivor Costa, MD 100 mL/hr at 08/21/19 0423 20 mg at 08/21/19 0423  . insulin aspart (novoLOG) injection 0-5 Units  0-5 Units Subcutaneous QHS Ivor Costa, MD      . insulin aspart (novoLOG) injection 0-9 Units  0-9 Units Subcutaneous TID WC Ivor Costa, MD   1 Units at 08/21/19 0825  . levETIRAcetam (KEPPRA) IVPB 1000 mg/100 mL premix  1,000 mg Intravenous Q12H Ivor Costa, MD      .  LORazepam (ATIVAN) injection 1 mg  1 mg Intravenous Q2H PRN Ivor Costa, MD      . nicotine (NICODERM CQ - dosed in mg/24 hours) patch 21 mg  21 mg Transdermal Daily Ivor Costa, MD      . ondansetron [facility]) tablet 4 mg  4 mg Oral Q6H PRN Ivor Costa, MD       Or  . ondansetron (ZOFRAN) injection 4 mg  4 mg Intravenous Q6H PRN Ivor Costa, MD       Current Outpatient Medications  Medication Sig Dispense Refill  . aspirin EC 81 MG tablet Take 1 tablet (81 mg total) by mouth daily.    [name] dexamethasone (DECADRON) 1 MG tablet Take 1 tablet (1 mg total) by mouth daily with  breakfast. 30 tablet 0  . DULoxetine (CYMBALTA) 60 MG capsule Take 1 capsule (60 mg total) by mouth daily. 30 capsule 2  . feeding supplement, ENSURE ENLIVE, (ENSURE ENLIVE) LIQD Take 237 mLs by mouth 2 (two) times daily between meals. 237 mL 12  . glipiZIDE (GLUCOTROL) 5 MG tablet Take 1 tablet (5 mg total) by mouth 2 (two) times daily before a meal. 60 tablet 3  . JANUVIA 50 MG tablet TAKE 1 TABLET(50 MG) BY MOUTH DAILY (Patient taking differently: Take 50 mg by mouth daily. ) 30 tablet 0  . levETIRAcetam (KEPPRA) 1000 MG tablet Take 1 tablet (1,000 mg total) by mouth 2 (two) times daily. 60 tablet 5  . ondansetron (ZOFRAN ODT) 4 MG disintegrating tablet Take 1 tablet (4 mg total) by mouth every 8 (eight) hours as needed for nausea or vomiting. 20 tablet 0  . pantoprazole (PROTONIX) 40 MG tablet Take 40 mg by mouth daily.    . potassium chloride (K-DUR) 10 MEQ tablet Take 2 tablets (20 mEq total) by mouth daily for 3 days. 6 tablet 0  . Vitamin D, Ergocalciferol, (DRISDOL) 1.25 MG (50000 UT) CAPS capsule TAKE 1 CAPSULE BY MOUTH 3 TIMES WEEKLY (Patient taking differently: Take 50,000 Units by mouth 3 (three) times a week. TAKE 1 CAPSULE BY MOUTH 3 TIMES WEEKLY) 24 capsule 0  . chlorhexidine (PERIDEX) 0.12 % solution Use as directed 15 mLs in the mouth or throat 2 (two) times daily as needed (mouth rinse after vomiting). Swish and spit (Patient not taking: Reported on 07/20/2019) 473 mL 1   Facility-Administered Medications Ordered in Other Encounters  Medication Dose Route Frequency Provider Last Rate Last Dose  . denosumab (XGEVA) injection 120 mg  120 mg Subcutaneous Once [name], MD      . fulvestrant (FASLODEX) injection 500 mg  500 mg Intramuscular Once [name], MD         Allergies  Allergen Reactions  . Thorazine [Chlorpromazine] Anaphylaxis  . Ribociclib Other (See Comments)    Exfoliative dermatitis  :  Family History  Problem Relation Age of Onset  .  Cancer Mother        breast  . Cancer Sister        breast  :  Social History   Socioeconomic History  . Marital status: Single    Spouse name: Not on file  . Number of children: Not on file  . Years of education: Not on file  . Highest education level: Not on file  Occupational History  . Not on file  Social Needs  . Financial resource strain: Not on file  . Food insecurity    Worry: Not on file    Inability:  Not on file  . Transportation needs    Medical: Not on file    Non-medical: Not on file  Tobacco Use  . Smoking status: Current Every Day Smoker    Packs/day: 0.15    Types: Cigarettes    Last attempt to quit: 01/21/2017    Years since quitting: 2.5  . Smokeless tobacco: Never Used  Substance and Sexual Activity  . Alcohol use: No    Alcohol/week: 0.0 standard drinks  . Drug use: No  . Sexual activity: Never  Lifestyle  . Physical activity    Days per week: Not on file    Minutes per session: Not on file  . Stress: Not on file  Relationships  . Social Herbalist on phone: Not on file    Gets together: Not on file    Attends religious service: Not on file    Active member of club or organization: Not on file    Attends meetings of clubs or organizations: Not on file    Relationship status: Not on file  . Intimate partner violence    Fear of current or ex partner: No    Emotionally abused: No    Physically abused: No    Forced sexual activity: No  Other Topics Concern  . Not on file  Social History Narrative   Lives alone. 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 Has phobia of the medical system.  :  Review of Systems: Unable to obtain comprehensive review of systems secondary to patient sedation.  Exam: Patient Vitals for the past 24 hrs:  BP Temp Temp src Pulse Resp SpO2  08/21/19 0900 115/88 - - 98 17 94 %  08/21/19 0752 - - - 72 11 96 %  08/21/19 0700 113/86 - - 95 11 96 %  08/21/19 0630 116/86 - - 82 15 94 %  08/21/19 0600 108/74 - - 93 12  96 %  08/21/19 0530 107/71 - - 82 11 95 %  08/21/19 0500 102/78 - - 87 13 98 %  08/21/19 0430 110/90 - - (!) 111 (!) 21 98 %  08/21/19 0400 102/71 - - 88 11 98 %  08/21/19 0330 115/70 - - 93 12 96 %  08/21/19 0300 99/78 - - 96 15 94 %  08/21/19 0240 115/74 - - 83 13 96 %  08/21/19 0217 119/77 - - 100 15 97 %  08/21/19 0130 (!) 144/107 - - 98 14 98 %  08/21/19 0103 (!) 129/92 - - (!) 104 16 98 %  08/21/19 0030 (!) 130/91 - - (!) 105 13 96 %  08/21/19 0000 (!) 129/94 - - (!) 106 13 98 %  08/20/19 2330 114/80 - - (!) 105 15 98 %  08/20/19 2305 (!) 138/96 - - (!) 108 18 99 %  08/20/19 2230 (!) 125/98 - - (!) 104 13 93 %  08/20/19 2200 109/89 - - (!) 104 13 96 %  08/20/19 2100 100/80 - - (!) 101 13 96 %  08/20/19 2030 101/75 - - (!) 105 13 96 %  08/20/19 1945 (!) 122/93 - - 90 11 96 %  08/20/19 1915 113/89 - - (!) 104 12 98 %  08/20/19 1845 107/87 - - 94 10 98 %  08/20/19 1830 108/82 - - 93 12 98 %  08/20/19 1815 109/83 - - 99 12 99 %  08/20/19 1800 (!) 115/91 - - 100 13 100 %  08/20/19 1745 (!) 123/104 - - 94 15 99 %  08/20/19 1730 (!) 119/93 - - (!) 105 14 99 %  08/20/19 1715 112/90 - - (!) 106 14 95 %  08/20/19 1651 (!) 117/91 97.9 F (36.6 C) Oral (!) 107 14 96 %    General: Sedated, opens eyes when spoken to.  No distress Eyes: Scleral icterus noted. Lymphatics:  Negative cervical, supraclavicular or axillary adenopathy.   Respiratory: lungs were clear bilaterally without wheezing or crackles.   Cardiovascular:  Regular rate and rhythm, S1/S2, without murmur, rub or gallop.  There was no pedal edema.   GI:  abdomen was soft, flat, nontender, nondistended, without organomegaly.   Musculoskeletal:  no spinal tenderness of palpation of vertebral spine.   Skin exam was without echymosis, petichae.   Neuro exam was nonfocal.  Patient sedated secondary to Ativan.  Was able to tell me that she knows that she is at [facility].  Lab Results  Component Value Date   WBC 7.6  08/21/2019   HGB 13.7 08/21/2019   HCT 42.3 08/21/2019   PLT 159 08/21/2019   GLUCOSE 128 (H) 08/21/2019   ALT 232 (H) 08/20/2019   AST 294 (HH) 08/20/2019   NA 137 08/21/2019   K 3.8 08/21/2019   CL 104 08/21/2019   CREATININE 0.44 08/21/2019   BUN 13 08/21/2019   CO2 23 08/21/2019    Ct Head W Or Wo Contrast  Result Date: 08/20/2019 CLINICAL DATA:  Altered mental status. Metastatic breast cancer. EXAM: CT HEAD WITHOUT AND WITH CONTRAST TECHNIQUE: Contiguous axial images were obtained from the base of the skull through the vertex without and with intravenous contrast CONTRAST:  67m OMNIPAQUE IOHEXOL 300 MG/ML  SOLN COMPARISON:  Head MRI 06/07/2019 FINDINGS: Brain: There is extensive, confluent hypoattenuation throughout the white matter of the left frontal lobe extending into the left temporal lobe, markedly increased from the prior MRI and consistent with worsening edema. New moderately extensive edema is present in the right frontal lobe. There is associated sulcal effacement in these regions without midline shift. There is abnormal enhancement over the left greater than right cerebral convexities which is both nodular and curvilinear and corresponds to known dural metastatic disease. This is better demonstrated by MRI than CT, however there is concern for interval progression with leptomeningeal involvement a possibility given more notable sulcal enhancement today compared to the prior MRI. No acute cortically based infarct is identified. The lateral and third ventricles are partially compressed due to edema. The basilar cisterns are patent. Marked bilateral inferior cerebellar atrophy is again noted. An 11 mm enhancing extra-axial mass in the posterior fossa posterior to the right jugular foramen is unchanged and compatible with a meningioma. A presumed partially calcified meningioma in the right prepontine cistern is unchanged as well. No acute intracranial hemorrhage or extra-axial fluid  collection is identified. Vascular: No suspicious noncontrast vascular hyperdensity. Grossly patent major dural venous sinuses. Skull: No destructive skull lesion. Sinuses/Orbits: Visualized paranasal sinuses and mastoid air cells are clear. Orbits are unremarkable. Other: None. IMPRESSION: 1. Extensive left greater than right frontal lobe edema extending into the left temporal lobe, greatly progressed from the prior MRI. Regional mass effect without midline shift. 2. Enhancement over the left greater than right cerebral convexities corresponding to known dural metastases. Contrast-enhanced brain MRI is recommended to evaluate for suspected progression and for the possibility of leptomeningeal involvement. Electronically Signed   By: ALogan BoresM.D.   On: 08/20/2019 21:47     Ct Head W Or Wo Contrast  Result  Date: 08/20/2019 CLINICAL DATA:  Altered mental status. Metastatic breast cancer. EXAM: CT HEAD WITHOUT AND WITH CONTRAST TECHNIQUE: Contiguous axial images were obtained from the base of the skull through the vertex without and with intravenous contrast CONTRAST:  61m OMNIPAQUE IOHEXOL 300 MG/ML  SOLN COMPARISON:  Head MRI 06/07/2019 FINDINGS: Brain: There is extensive, confluent hypoattenuation throughout the white matter of the left frontal lobe extending into the left temporal lobe, markedly increased from the prior MRI and consistent with worsening edema. New moderately extensive edema is present in the right frontal lobe. There is associated sulcal effacement in these regions without midline shift. There is abnormal enhancement over the left greater than right cerebral convexities which is both nodular and curvilinear and corresponds to known dural metastatic disease. This is better demonstrated by MRI than CT, however there is concern for interval progression with leptomeningeal involvement a possibility given more notable sulcal enhancement today compared to the prior MRI. No acute cortically  based infarct is identified. The lateral and third ventricles are partially compressed due to edema. The basilar cisterns are patent. Marked bilateral inferior cerebellar atrophy is again noted. An 11 mm enhancing extra-axial mass in the posterior fossa posterior to the right jugular foramen is unchanged and compatible with a meningioma. A presumed partially calcified meningioma in the right prepontine cistern is unchanged as well. No acute intracranial hemorrhage or extra-axial fluid collection is identified. Vascular: No suspicious noncontrast vascular hyperdensity. Grossly patent major dural venous sinuses. Skull: No destructive skull lesion. Sinuses/Orbits: Visualized paranasal sinuses and mastoid air cells are clear. Orbits are unremarkable. Other: None. IMPRESSION: 1. Extensive left greater than right frontal lobe edema extending into the left temporal lobe, greatly progressed from the prior MRI. Regional mass effect without midline shift. 2. Enhancement over the left greater than right cerebral convexities corresponding to known dural metastases. Contrast-enhanced brain MRI is recommended to evaluate for suspected progression and for the possibility of leptomeningeal involvement. Electronically Signed   By: ALogan BoresM.D.   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 Multifocal tumor in the left breast with biopsy-proven left axillary metastases.   Noted to have extensive bone metastases and pulmonary metastases. Patient was noted to have calvarial metastases but no overt parenchymal metastasis 09/14/17 CT Chest/ad/pelvis Results discussed in details - good response.  02/22/18 CT revealed  Newly apparent 2.1 by 2.0 cm rim enhancing lesion in the right hepatic lobe suspicious for a metastatic lesion. This was not apparent on prior exams although the prior exams  were all noncontrast and thus the lesion may have been present but with reduced conspicuity. There is also a small enhancing lesion further posteriorly in the right hepatic lobe which is technically nonspecific and could be a small hemangioma or a small metastatic lesion. Stable distribution and appearance of prior sclerotic osseous metastatic disease without bony progression. 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   07/06/18 CT C/A/P which revealed New pattern of heterogeneous enhancement and enhancing ill-defined lesions in the central LEFT and RIGHT hepatic lobe at site prior enhancing lesion is highly concerning for progression of of infiltrative malignancy in the liver occupying a large portion of the RIGHT hepatic lobe and central LEFT hepatic lobe. 2. Small amount of free fluid in the abdomen pelvis. 3.  No metastatic adenopathy.4. No evidence pulmonary metastasis or lymphadenopathy. 5. Stable dense sclerotic skeletal metastasis   07/11/18 ECHO revealed LV EF of 55%-60%   07/18/18 Liver needle/core biopsy right lesion consi with Ki67 at 40%, ER/PR +ve   07/28/18 MRI Abdomen MRCP revealed Extensive hepatic metastatic disease, possibly progressive from recent abdominal CT. 2. No evidence of intrahepatic or extrahepatic biliary dilatation. Elevated liver function studies may be secondary to intrahepatic cholestasis. 3. Increased ascites without peritoneal nodularity or suspicious enhancement. 4. Grossly stable widespread osseous metastatic disease.   S/p 6 cycles of AC completed on 11/01/18  03/20/19 CT C/A/P revealed "Although the dominant liver lesion appears improved in the interval, there appears to be 2 new liver metastases on today's exam, concerning for disease progression. 2. Similar appearance of the widespread sclerotic bone metastases. 3. Interval decrease in ascites. 4.Aortic Atherosclerois."  Pt's planned  third weekly infusion of Taxol was cancelled due to dental abscess s/p antibiotic course. Then cancelled again due to thrombocytopenia thought to be due to antibiotic course.  07/19/19 CT C/A/P revealed "1. Reduction in size and degree of enhancement in the liver lesions, most notably in the lesion posteriorly in the right hepatic lobe. Irregular hepatic morphology likely due to a combination of tumor and cirrhosis. 2. Mild increase in sclerosis of some of the pre-existing bony lesions. Given the improvement in the liver lesions, this may represent a positive response to therapy rather than a worsening. 3. Other imaging findings of potential clinical significance: Aortic Atherosclerosis (ICD10-I70.0). Sigmoid colon diverticulosis."  07/19/19 bone scan revealed "Progressive osseous metastatic disease."  #2 Bone metastases due to breast cancer- on Xgeva. Much improved back pain.  #3 Increased LFTs due to liver metastases  #5 Cancer related pain - controlled - status post palliative RT to spine - no currently needing any pain medications.  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 No other new medication. Now resolved. Monitoring on increasing doses of Verzenio. No issues with recurrent rash on Verzenio '150mg'$  po BID  #7 Brain metastasis  07/20/18 CT Head revealed 1.8 x 2.4 x 3.9 cm LEFT frontal cortical based mass with possible dural component highly concerning for metastatic disease, less likely abscess or cerebritis. Severe vasogenic edema with 5 mm LEFT to RIGHT subfalcine herniation. No ventricular entrapment. 2. 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 3. Multiple calvarial metastasis. 4. Cerebellar atrophy.    07/21/18 MRI Brain revealed  Irregular plaque-like mass, likely metastasis, measuring up to 5.1 cm, appears to originate from the left anterolateral frontal dura with invasion of the underlying frontal lobe of the  brain. Mass effect from brain edema and the lesion partially effaces the frontal horn of left lateral ventricle and results in 7 mm left-to-right midline shift of anterior septum pellucidum. 2. Diffuse dural thickening over the left cerebral convexity may represent edema associated with the tumor or invasive neoplasm. 3. 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   07/28/18 MRI Brain revealed Extensive nodular dural enhancement left frontal lobe with associated enhancing mass growing in the left frontal lobe is unchanged in size and compatible with metastatic disease. This is associated with metastatic disease to the left frontal bone. Extensive edema in the left frontal lobe has progressed in the interval. Blastic metastatic disease throughout the calvarium. Metastatic disease in the cervical spine. Enhancing mass along the floor of the posterior fossa on the right is stable and most  consistent with meningioma. 6 mm enhancing mass in the prepontine cistern on the right also unchanged and compatible with meningioma.   s/p SRS radiation therapy  10/23/18 MRI Brain revealed SRS protocol demonstrating treatment response: Regression of LEFT frontal dural metastasis, decreased vasogenic edema and parenchymal invasion. Similar calvarial metastasis. 2. Stable appearance of 2 small posterior fossa meningiomas. 3. No acute intracranial process.   02/02/19 MRI Brain revealed "Marked progression of dural disease left greater than right as described above. Pronounced brain edema, left more than right with left-to-right shift of 1 cm. 2. I do not identify any brain parenchymal metastases. 3. Small stable incidental meningiomas in the posterior fossa as above."  Pt is s/p SRS for dural metastases.  06/07/2019 MRI brain w and wo contrast revealed "1. Progression of disease disease as evidenced by new dural metastasis along the inferolateral left temporal lobe with mild  adjacent brain edema, see image 13:43. 2. Positive response at the intervally treated diffuse dural metastatic disease elsewhere along the bilateral cerebral convexity. 3. Posterior fossa meningiomas are stable. 4. No primary parenchymal metastasis."  08/20/19 CT of the head with and without contrast revealed "1. Extensive left greater than right frontal lobe edema extending into the left temporal lobe, greatly progressed from the prior MRI. Regional mass effect without midline shift. 2. Enhancement over the left greater than right cerebral convexities corresponding to known dural metastases. Contrast-enhanced brain MRI is recommended to evaluate for suspected progression and for the possibility of leptomeningeal involvement."  #8 Vitamin D deficiency Vit D levels improved to 44.8  On 12/01/2018 -continue Vit D replacement.  #9 h/o Left upper extremity swelling- [place] neg for DVT - now swelling resolved.  -The patient has developed worsening brain metastases.  Has previously received SRS for her dural metastases.  CT of the head will be reviewed later today by Dr. Irene Limbo.  We will make further recommendations pending that review of considering whole brain radiation versus hospice referral. -Continue dexamethasone and Keppra. -Continue close monitoring of her liver function.  Liver dysfunction likely related to her liver metastases.  Will likely need to discuss with family pursuing hospice upon discharge.  Thank you for this referral.   Mikey Bussing, DNP, AGPCNP-BC, AOCNP  ADDENDUM: Will request formal Neuro Oncology consult to weigh in on CT results - ? Worsening mets vs. Radiation necrosis.   ADDENDUM  .Patient was Personally and independently interviewed, examined and relevant elements of the history of present illness were reviewed in details and an assessment and plan was created. All elements of the patient's history of present illness , assessment and plan were discussed in details with  Mikey Bussing, DNP. The above documentation reflects our combined findings assessment and plan.  Patient with worsened neurological symptoms/. CT head w and wo contrast concerning for progressive leptomeningeal metastatic disease. Appreciate neuro onc input from Dr Mickeal Skinner. Patient has had SRS for dural mets x 2 and would not be a good candidate for WBRT.  -IV dexamethasone 33m q6h with diabetes mx -antiseizure rx -consider MRI BRain w and wo contrast -goals of care discussion done with patient. Also called and talked with patients sister [place]-- she shall be coming to the hospital tomorrow. -based on goals of care and performance status and improvement --if doing well would consider BEEP( Bevacizumab/Cisplatin/Etoposide) +/-IT Methotrexate. Without treatment life expectancy <=3 months and would be a candidate for hospice -Stacey Billingsis deciding whether she can/shall take [place] to [place] was the plan prior  to patients current deterioration.  Sullivan Lone MD MS

## 2019-08-21 NOTE — Consult Note (Signed)
[facility] Consult Note  Patient Care Team: [name], MD as PCP - General (Family Medicine)  CHIEF COMPLAINTS/PURPOSE OF CONSULTATION:  Seizures Brain Tumor  HISTORY OF PRESENTING ILLNESS:  Stacey Cox 63 y.o. female presented with confusion, word finding difficulty, and overall failure to thrive.  History was provided in a very limited way only by the patient.  Apparently there was a witnessed seizure in the Emergency Department characterized by right sided twitching and facial twitching.  She obtained a CT head which demonstrated progression of disease.  MEDICAL HISTORY:  Past Medical History:  Diagnosis Date  . Cancer [facility])    Metastatic Breast Cancer    SURGICAL HISTORY: Past Surgical History:  Procedure Laterality Date  . CHOLECYSTECTOMY    . IR IMAGING GUIDED PORT INSERTION  07/14/2018  . TONSILLECTOMY      SOCIAL HISTORY: Social History   Socioeconomic History  . Marital status: Single    Spouse name: Not on file  . Number of children: Not on file  . Years of education: Not on file  . Highest education level: Not on file  Occupational History  . Not on file  Social Needs  . Financial resource strain: Not on file  . Food insecurity    Worry: Not on file    Inability: Not on file  . Transportation needs    Medical: Not on file    Non-medical: Not on file  Tobacco Use  . Smoking status: Current Every Day Smoker    Packs/day: 0.15    Types: Cigarettes    Last attempt to quit: 01/21/2017    Years since quitting: 2.5  . Smokeless tobacco: Never Used  Substance and Sexual Activity  . Alcohol use: No    Alcohol/week: 0.0 standard drinks  . Drug use: No  . Sexual activity: Never  Lifestyle  . Physical activity    Days per week: Not on file    Minutes per session: Not on file  . Stress: Not on file  Relationships  . Social Herbalist on phone: Not on file    Gets together: Not on file    Attends religious  service: Not on file    Active member of club or organization: Not on file    Attends meetings of clubs or organizations: Not on file    Relationship status: Not on file  . Intimate partner violence    Fear of current or ex partner: No    Emotionally abused: No    Physically abused: No    Forced sexual activity: No  Other Topics Concern  . Not on file  Social History Narrative   Lives alone. 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 Has phobia of the medical system.    FAMILY HISTORY: Family History  Problem Relation Age of Onset  . Cancer Mother        breast  . Cancer Sister        breast    ALLERGIES:  is allergic to thorazine [chlorpromazine] and ribociclib.  MEDICATIONS:  Current Facility-Administered Medications  Medication Dose Route Frequency Provider Last Rate Last Dose  . acetaminophen (TYLENOL) tablet 650 mg  650 mg Oral Q6H PRN Ivor Costa, MD       Or  . acetaminophen (TYLENOL) suppository 650 mg  650 mg Rectal Q6H PRN Ivor Costa, MD      . Chlorhexidine Gluconate Cloth 2 % PADS 6 each  6 each Topical Daily Barb Merino, MD      .  dexamethasone (DECADRON) injection 6 mg  6 mg Intravenous Q6H [name], MD   6 mg at 08/21/19 1222  . enoxaparin (LOVENOX) injection 40 mg  40 mg Subcutaneous Daily Ivor Costa, MD      . famotidine (PEPCID) IVPB 20 mg premix  20 mg Intravenous Q12H Ivor Costa, MD 100 mL/hr at 08/21/19 1100 20 mg at 08/21/19 1100  . insulin aspart (novoLOG) injection 0-5 Units  0-5 Units Subcutaneous QHS Ivor Costa, MD      . insulin aspart (novoLOG) injection 0-9 Units  0-9 Units Subcutaneous TID WC Ivor Costa, MD   1 Units at 08/21/19 0825  . levETIRAcetam (KEPPRA) IVPB 1000 mg/100 mL premix  1,000 mg Intravenous Q12H Ivor Costa, MD 400 mL/hr at 08/21/19 1059 1,000 mg at 08/21/19 1059  . LORazepam (ATIVAN) injection 1 mg  1 mg Intravenous Q2H PRN Ivor Costa, MD      . nicotine (NICODERM CQ - dosed in mg/24 hours) patch 21 mg  21 mg Transdermal Daily  Ivor Costa, MD   21 mg at 08/21/19 1105  . ondansetron (ZOFRAN) tablet 4 mg  4 mg Oral Q6H PRN Ivor Costa, MD       Or  . ondansetron [facility]) injection 4 mg  4 mg Intravenous Q6H PRN Ivor Costa, MD       Facility-Administered Medications Ordered in Other Encounters  Medication Dose Route Frequency Provider Last Rate Last Dose  . denosumab (XGEVA) injection 120 mg  120 mg Subcutaneous Once [name], MD      . fulvestrant (FASLODEX) injection 500 mg  500 mg Intramuscular Once [name], MD        REVIEW OF SYSTEMS:   Limited by AMS   PHYSICAL EXAMINATION: Vitals:   08/21/19 1315 08/21/19 1527  BP: (!) 125/93 112/78  Pulse: 83 80  Resp: 12 18  Temp:  99.1 F (37.3 C)  SpO2: 96% 98%   KPS: 50. General: Alert, cooperative, pleasant, in no acute distress Head: Craniotomy scar noted, dry and intact. EENT: No conjunctival injection or scleral icterus. Oral mucosa moist Lungs: Resp effort normal Cardiac: Regular rate and rhythm Abdomen: Soft, non-distended abdomen Skin: No rashes cyanosis or petechiae. Extremities: No clubbing or edema  NEUROLOGIC EXAM: Mental Status: Eyes closed, arouses to loud verbal stimulus.  Not alert, can't maintain attention to examiner.  Able to answer simple questions and is oriented to self and hospital.  Expressive dysphasia is noted despite lack of alertness/attention. Cranial Nerves: Eye mov'ts intact grossly, pupils reactive Motor: Moves both arms and legs to command although L>R Sensory: Intact grossly Gait: Deferred  LABORATORY DATA:  I have reviewed the data as listed Lab Results  Component Value Date   WBC 7.6 08/21/2019   HGB 13.7 08/21/2019   HCT 42.3 08/21/2019   MCV 93.0 08/21/2019   PLT 159 08/21/2019   Recent Labs    08/13/19 1128 08/17/19 1133 08/20/19 1126 08/21/19 0633  NA 139 140 137 137  K 2.7* 2.9* 4.0 3.8  CL 101 102 103 104  CO2 27 27 24 23   GLUCOSE 74 115* 90 128*  BUN 15 10 11 13    CREATININE 0.58 0.48 0.60 0.44  CALCIUM 8.1* 9.0 8.9 7.8*  GFRNONAA >60 >60 >60 >60  GFRAA >60 >60 >60 >60  PROT 6.5 6.4* 6.4*  --   ALBUMIN 2.8* 2.8* 2.6*  --   AST 220* 281* 294*  --   ALT 200* 224* 232*  --  ALKPHOS 687* 747* 875*  --   BILITOT 5.0* 4.2* 3.8*  --   BILIDIR  --  2.5*  --   --   IBILI  --  1.7*  --   --     RADIOGRAPHIC STUDIES: I have personally reviewed the radiological images as listed and agreed with the findings in the report. Ct Head W Or Wo Contrast  Result Date: 08/20/2019 CLINICAL DATA:  Altered mental status. Metastatic breast cancer. EXAM: CT HEAD WITHOUT AND WITH CONTRAST TECHNIQUE: Contiguous axial images were obtained from the base of the skull through the vertex without and with intravenous contrast CONTRAST:  48mL OMNIPAQUE IOHEXOL 300 MG/ML  SOLN COMPARISON:  Head MRI 06/07/2019 FINDINGS: Brain: There is extensive, confluent hypoattenuation throughout the white matter of the left frontal lobe extending into the left temporal lobe, markedly increased from the prior MRI and consistent with worsening edema. New moderately extensive edema is present in the right frontal lobe. There is associated sulcal effacement in these regions without midline shift. There is abnormal enhancement over the left greater than right cerebral convexities which is both nodular and curvilinear and corresponds to known dural metastatic disease. This is better demonstrated by MRI than CT, however there is concern for interval progression with leptomeningeal involvement a possibility given more notable sulcal enhancement today compared to the prior MRI. No acute cortically based infarct is identified. The lateral and third ventricles are partially compressed due to edema. The basilar cisterns are patent. Marked bilateral inferior cerebellar atrophy is again noted. An 11 mm enhancing extra-axial mass in the posterior fossa posterior to the right jugular foramen is unchanged and compatible  with a meningioma. A presumed partially calcified meningioma in the right prepontine cistern is unchanged as well. No acute intracranial hemorrhage or extra-axial fluid collection is identified. Vascular: No suspicious noncontrast vascular hyperdensity. Grossly patent major dural venous sinuses. Skull: No destructive skull lesion. Sinuses/Orbits: Visualized paranasal sinuses and mastoid air cells are clear. Orbits are unremarkable. Other: None. IMPRESSION: 1. Extensive left greater than right frontal lobe edema extending into the left temporal lobe, greatly progressed from the prior MRI. Regional mass effect without midline shift. 2. Enhancement over the left greater than right cerebral convexities corresponding to known dural metastases. Contrast-enhanced brain MRI is recommended to evaluate for suspected progression and for the possibility of leptomeningeal involvement. Electronically Signed   By: Logan Bores M.D.   On: 08/20/2019 21:47    ASSESSMENT & PLAN:  Brain Metastases Focal Seizures  Findings on CT head are concerning and clearly represent progressive disease.  Seizures are secondary to these changes.   Even if she were to improve back to baseline post-ictally (walking and talking) I'm not sure we have great options to treat this CNS process.  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  We have given several courses of radiosurgery to active and relapsed sites of dural involvement, but obviously have not achieved good long term control with this approach.  Whole brain radiation carries risk of secondary inflammation due to heavy pre-treatment.  Intrathecal chemotherapy is unlikely to penetrate nodular foci of disease and is additionally quite cumbersome and toxic.  Right now proper short term approach is high dose steroids, anti-epileptics, close monitoring.  MRI can  help characterize exact extent of CNS process, if she is able to tolerate it and lay still.    In  the meantime, recommend palliative care evaluation as well as further input from Dr. Jason Fila.   Will con't to follow.  The total time spent in the encounter was 40 minutes and more than 50% was on counseling and review of test results     Ventura Sellers, MD 08/21/2019 5:00 PM

## 2019-08-21 NOTE — Progress Notes (Signed)
PROGRESS NOTE    Stacey Cox  [id number] DOB: [birth year] DOA: 08/20/2019 PCP: [name], MD    Brief Narrative:   63 year old female with history of breast cancer metastasis to lungs, liver, brain and bone on chemotherapy, type 2 diabetes on insulin, depression, seizure disorder who presented to the emergency room with altered mental status.  Patient was at [facility] for chemotherapy and sent to ER because she was found altered and had difficulty finding words.  She was also noted to have right facial twitching and seizures in the emergency room.  Initially reported that her right arm was limp and nonmobile, however she had been moving her all extremities in the ER. In the emergency room, she was found with abnormal LFTs, on room air, blood pressure is stable.  Electrolytes were normal.  Afebrile.  COVID-19 test negative.  CT scan of the head shows extensive left greater than right frontal lobe edema extending into the left temporal lobe.  Was loaded with dexamethasone and Keppra and admitted to the hospital.  Assessment & Plan:   Principal Problem:   Acute metabolic encephalopathy Active Problems:   Bone metastases ([place])   Liver metastases ([place])   Breast cancer metastasized to brain ([place])   Seizures ([place])   Leukocytosis   Diabetes mellitus without complication (HCC)   Tobacco abuse  Seizure disorder/postictal confusion and acute metabolic encephalopathy due to widespread brain mets and cerebral edema: Clinically stabilizing now. Continue dexamethasone.  Close neurochecks.  Fall precautions.  Seizure precautions. Keppra thousand milligrams twice a day, IV today.  Patient on Keppra at home. Ativan IV for breakthrough seizures.  Breast cancer with metastasis/liver, lungs, brain and bone metastasis with abnormal LFTs: On chemotherapy.  Followed by oncology.  Will be seen by them.  Continuing treatment versus hospice recommendation will be guided by oncology team.  Will  defer to them. Palliative medicine team consulted.   Type 2 diabetes on insulin: Anticipate worsening blood sugars with use of dexamethasone.  On additional sliding scale insulin to cover.  I called and updated patient's friend, Ms. Suszanne Conners about patient's condition.  Also called and updated patient's sister Ms. Claiborne Billings who was driving here from [facility] to meet her.   DVT prophylaxis: Lovenox subcu Code Status: Full code Family Communication: Sister and friend Disposition Plan: Unknown at this time.  Home with hospice versus active treatment   Consultants:   Oncology  Procedures:   None  Antimicrobials:   None   Subjective: Patient seen and examined.  Early morning hours she was very sleepy and lethargic. Went to reexamine the patient, she is now awake, still looks sleepy and lethargic, however she was able to keep up with conversation and denied any complaints other than feeling weak.  Objective: Vitals:   08/21/19 0900 08/21/19 1000 08/21/19 1100 08/21/19 1315  BP: 115/88 117/90 (!) 117/93 (!) 125/93  Pulse: 98 90 93 83  Resp: 17 13 12 12   Temp:      TempSrc:      SpO2: 94% 95% 98% 96%    Intake/Output Summary (Last 24 hours) at 08/21/2019 1409 Last data filed at 08/20/2019 2328 Gross per 24 hour  Intake 100 ml  Output --  Net 100 ml   There were no vitals filed for this visit.  Examination:  General exam: Appears calm and comfortable, on room air.  Chronically sick looking.  Lethargic. Respiratory system: Clear to auscultation. Respiratory effort normal. Cardiovascular system: S1 & S2 heard, RRR. No JVD, murmurs,  rubs, gallops or clicks.  Left arm with chronic lymphedema. Gastrointestinal system: Abdomen is nondistended, soft and nontender. No organomegaly or masses felt. Normal bowel sounds heard. Central nervous system: Alert and oriented. No focal neurological deficits.  She can move all extremities with no deficits. Extremities: Symmetric 5 x 5  power. Skin: No rashes, lesions or ulcers Psychiatry: Judgement and insight appear normal. Mood & affect anxious and flat.    Data Reviewed: I have personally reviewed following labs and imaging studies  CBC: Recent Labs  Lab 08/17/19 1133 08/20/19 1126 08/21/19 0633  WBC 9.0 11.3* 7.6  NEUTROABS  --  9.4*  --   HGB 15.1* 15.3* 13.7  HCT 45.7 45.7 42.3  MCV 94.2 90.0 93.0  PLT 172 182 [id number]   Basic Metabolic Panel: Recent Labs  Lab 08/17/19 1133 08/20/19 1126 08/21/19 0633  NA 140 137 137  K 2.9* 4.0 3.8  CL 102 103 104  CO2 27 24 23   GLUCOSE 115* 90 128*  BUN 10 11 13   CREATININE 0.48 0.60 0.44  CALCIUM 9.0 8.9 7.8*   GFR: Estimated Creatinine Clearance: 74.4 mL/min (by C-G formula based on SCr of 0.44 mg/dL). Liver Function Tests: Recent Labs  Lab 08/17/19 1133 08/20/19 1126  AST 281* 294*  ALT 224* 232*  ALKPHOS 747* 875*  BILITOT 4.2* 3.8*  PROT 6.4* 6.4*  ALBUMIN 2.8* 2.6*   No results for input(s): LIPASE, AMYLASE in the last 168 hours. No results for input(s): AMMONIA in the last 168 hours. Coagulation Profile: No results for input(s): INR, PROTIME in the last 168 hours. Cardiac Enzymes: No results for input(s): CKTOTAL, CKMB, CKMBINDEX, TROPONINI in the last 168 hours. BNP (last 3 results) No results for input(s): PROBNP in the last 8760 hours. HbA1C: No results for input(s): HGBA1C in the last 72 hours. CBG: Recent Labs  Lab 08/17/19 1118 08/17/19 1527 08/20/19 1704 08/21/19 0734  GLUCAP 105* 152* 116* 132*   Lipid Profile: No results for input(s): CHOL, HDL, LDLCALC, TRIG, CHOLHDL, LDLDIRECT in the last 72 hours. Thyroid Function Tests: No results for input(s): TSH, T4TOTAL, FREET4, T3FREE, THYROIDAB in the last 72 hours. Anemia Panel: No results for input(s): VITAMINB12, FOLATE, FERRITIN, TIBC, IRON, RETICCTPCT in the last 72 hours. Sepsis Labs: Recent Labs  Lab 08/17/19 1149  LATICACIDVEN 2.1*    Recent Results (from the past  240 hour(s))  Urine culture     Status: None   Collection Time: 08/17/19  2:25 PM   Specimen: Urine, Random  Result Value Ref Range Status   Specimen Description   Final    URINE, RANDOM Performed at [facility] [address], [place], [place] [address]    Special Requests   Final    NONE Performed at [facility], [place] [address] [place], [place] [address]    Culture   Final    Multiple bacterial morphotypes present, none predominant. Suggest appropriate recollection if clinically indicated.   Report Status 08/19/2019 FINAL  Final  SARS CORONAVIRUS 2 (TAT 6-24 HRS) Nasopharyngeal Nasopharyngeal Swab     Status: None   Collection Time: 08/20/19 10:59 PM   Specimen: Nasopharyngeal Swab  Result Value Ref Range Status   SARS Coronavirus 2 NEGATIVE NEGATIVE Final    Comment: (NOTE) SARS-CoV-2 target nucleic acids are NOT DETECTED. The SARS-CoV-2 RNA is generally detectable in upper and lower respiratory specimens during the acute phase of infection. Negative results do not preclude SARS-CoV-2 infection, do not rule out co-infections  with other pathogens, and should not be used as the sole basis for treatment or other patient management decisions. Negative results must be combined with clinical observations, patient history, and epidemiological information. The expected result is Negative. Fact Sheet for Patients: SugarRoll.be Fact Sheet for Healthcare Providers: https://www.woods-mathews.com/ This test is not yet approved or cleared by the [place] FDA and  has been authorized for detection and/or diagnosis of SARS-CoV-2 by FDA under an Emergency Use Authorization (EUA). This EUA will remain  in effect (meaning this test can be used) for the duration of the COVID-19 declaration under Section 56 4(b)(1) of the Act, 21 U.S.C. section 360bbb-3(b)(1), unless the authorization is terminated  or revoked sooner. Performed at [facility], [place] [address], [place], [place] [address]          Radiology Studies: Ct Head W Or Wo Contrast  Result Date: 08/20/2019 CLINICAL DATA:  Altered mental status. Metastatic breast cancer. EXAM: CT HEAD WITHOUT AND WITH CONTRAST TECHNIQUE: Contiguous axial images were obtained from the base of the skull through the vertex without and with intravenous contrast CONTRAST:  47mL OMNIPAQUE IOHEXOL 300 MG/ML  SOLN COMPARISON:  Head MRI 06/07/2019 FINDINGS: Brain: There is extensive, confluent hypoattenuation throughout the white matter of the left frontal lobe extending into the left temporal lobe, markedly increased from the prior MRI and consistent with worsening edema. New moderately extensive edema is present in the right frontal lobe. There is associated sulcal effacement in these regions without midline shift. There is abnormal enhancement over the left greater than right cerebral convexities which is both nodular and curvilinear and corresponds to known dural metastatic disease. This is better demonstrated by MRI than CT, however there is concern for interval progression with leptomeningeal involvement a possibility given more notable sulcal enhancement today compared to the prior MRI. No acute cortically based infarct is identified. The lateral and third ventricles are partially compressed due to edema. The basilar cisterns are patent. Marked bilateral inferior cerebellar atrophy is again noted. An 11 mm enhancing extra-axial mass in the posterior fossa posterior to the right jugular foramen is unchanged and compatible with a meningioma. A presumed partially calcified meningioma in the right prepontine cistern is unchanged as well. No acute intracranial hemorrhage or extra-axial fluid collection is identified. Vascular: No suspicious noncontrast vascular hyperdensity. Grossly patent major dural venous sinuses. Skull: No destructive skull lesion.  Sinuses/Orbits: Visualized paranasal sinuses and mastoid air cells are clear. Orbits are unremarkable. Other: None. IMPRESSION: 1. Extensive left greater than right frontal lobe edema extending into the left temporal lobe, greatly progressed from the prior MRI. Regional mass effect without midline shift. 2. Enhancement over the left greater than right cerebral convexities corresponding to known dural metastases. Contrast-enhanced brain MRI is recommended to evaluate for suspected progression and for the possibility of leptomeningeal involvement. Electronically Signed   By: Logan Bores M.D.   On: 08/20/2019 21:47        Scheduled Meds:  dexamethasone (DECADRON) injection  6 mg Intravenous Q6H   enoxaparin (LOVENOX) injection  40 mg Subcutaneous Daily   insulin aspart  0-5 Units Subcutaneous QHS   insulin aspart  0-9 Units Subcutaneous TID WC   nicotine  21 mg Transdermal Daily   Continuous Infusions:  famotidine (PEPCID) IV 20 mg (08/21/19 1100)   levETIRAcetam 1,000 mg (08/21/19 1059)     LOS: 0 days    Time spent: 35 minutes    Barb Merino, MD [facility] Pager [phone]  If  7PM-7AM, please contact night-coverage [email or web address] Password [email or web address] 08/21/2019, 2:09 PM

## 2019-08-21 NOTE — Telephone Encounter (Signed)
No los per 9/28. °

## 2019-08-21 NOTE — ED Notes (Signed)
Patient more alert and oriented. Patient is not aware of place or situation, but patient oriented. Denies any needs at this time. Will continue to monitor patient.

## 2019-08-21 NOTE — ED Notes (Signed)
Patient sleeping peacefully at this time with no complaints. Will continue to monitor patient.

## 2019-08-22 ENCOUNTER — Encounter: Payer: Self-pay | Admitting: General Practice

## 2019-08-22 ENCOUNTER — Inpatient Hospital Stay (HOSPITAL_COMMUNITY): Payer: BC Managed Care – PPO

## 2019-08-22 DIAGNOSIS — C7949 Secondary malignant neoplasm of other parts of nervous system: Secondary | ICD-10-CM

## 2019-08-22 LAB — GLUCOSE, CAPILLARY
Glucose-Capillary: 180 mg/dL — ABNORMAL HIGH (ref 70–99)
Glucose-Capillary: 222 mg/dL — ABNORMAL HIGH (ref 70–99)
Glucose-Capillary: 184 mg/dL — ABNORMAL HIGH (ref 70–99)
Glucose-Capillary: 195 mg/dL — ABNORMAL HIGH (ref 70–99)

## 2019-08-22 LAB — URINE CULTURE

## 2019-08-22 MED ORDER — SODIUM CHLORIDE 0.9% FLUSH
10.0000 mL | INTRAVENOUS | Status: DC | PRN
  Administered 2019-08-25 – 2019-08-27 (×3): 10 mL
  Filled 2019-08-22 (×3): qty 40

## 2019-08-22 MED ORDER — FAMOTIDINE 20 MG PO TABS
20.0000 mg | ORAL_TABLET | Freq: Two times a day (BID) | ORAL | Status: DC
  Administered 2019-08-22 – 2019-08-27 (×8): 20 mg via ORAL
  Filled 2019-08-22 (×8): qty 1

## 2019-08-22 MED ORDER — SODIUM CHLORIDE 0.9% FLUSH
10.0000 mL | Freq: Two times a day (BID) | INTRAVENOUS | Status: DC
  Administered 2019-08-22 – 2019-08-25 (×7): 10 mL
  Administered 2019-08-26: 20 mL
  Administered 2019-08-27: 10 mL

## 2019-08-22 MED ORDER — LEVETIRACETAM 500 MG PO TABS
1000.0000 mg | ORAL_TABLET | Freq: Two times a day (BID) | ORAL | Status: DC
  Administered 2019-08-22 – 2019-08-25 (×7): 1000 mg via ORAL
  Filled 2019-08-22 (×7): qty 2

## 2019-08-22 MED ORDER — GADOBUTROL 1 MMOL/ML IV SOLN
8.0000 mL | Freq: Once | INTRAVENOUS | Status: AC | PRN
  Administered 2019-08-22: 8 mL via INTRAVENOUS

## 2019-08-22 NOTE — Evaluation (Signed)
SLP Cancellation Note  Patient Details Name: Cady Stickel MRN: [id number] DOB: 1956/05/19   Cancelled treatment:       Reason Eval/Treat Not Completed: Other (comment)(RN reports pt is tolerating po well as much as he can tell, will continue efforts for swallow evaluation)   Macario Golds 08/22/2019, 2:04 PM   Luanna Salk, [facility] SLP [place] Pager [phone] Office [phone]

## 2019-08-22 NOTE — Progress Notes (Signed)
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  Patient was at [facility] for chemotherapy and sent to ER because she was found altered and had difficulty finding words.  She was also noted to have right facial twitching and seizures in the emergency room.  Initially reported that her right arm was limp and nonmobile, however she had been moving her all extremities in the ER. In the emergency room, she was found with abnormal LFTs, on room air, blood pressure is stable.  Electrolytes were normal.  Afebrile.  COVID-19 test negative.  CT scan of the head shows extensive left greater than right frontal lobe edema extending into the left temporal lobe.  Was loaded with dexamethasone and Keppra and admitted to the hospital.  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 Continue dexamethasone.  neurochecks.  Fall precautions.  Seizure precautions. Was on IV Keppra, will change to oral today.  Ativan IV for breakthrough seizures.  Breast cancer with metastasis/liver, lungs, brain and bone metastasis with abnormal LFTs: On chemotherapy.  Followed by oncology.  Will be seen by them.  Continuing treatment versus hospice recommendation will be guided by oncology team.   Will defer to them.Palliative medicine team consulted.   Type 2 diabetes on insulin: Anticipate worsening blood sugars with use of dexamethasone.  On additional sliding scale insulin to cover.  Advance activities.  Work with PT OT. Patient sister at the bedside, updated in details about the general condition. I updated the oncology team to meet with sister and the patient to go through the treatment plan.  Her sister is also exploring option to take her to [place] to live with her and treatment to continue in [place].  DVT prophylaxis: Lovenox subcu Code Status: Full code Family Communication: Sister Claiborne Billings. Disposition Plan: Unknown at this time.  Home with hospice versus active treatment   Consultants:   Oncology  Procedures:   None  Antimicrobials:   None   Subjective: Patient seen and examined.  No overnight events.  Now she is more awake and alert.  No more seizures in the hospital.  Does not have good appetite.  Did not get good night sleep.  Objective: Vitals:   08/21/19 1527 08/21/19 2021 08/22/19 0500 08/22/19 0531  BP: 112/78 102/81  115/77  Pulse: 80 94  82  Resp: 18 16  16   Temp: 99.1 F (37.3 C) 97.6 F (36.4 C)  97.9 F (36.6 C)  TempSrc: Oral Oral  Oral  SpO2: 98% 97%  100%  Weight:   79.4 kg     Intake/Output Summary (Last 24 hours) at 08/22/2019 1241 Last data filed at 08/22/2019 0600 Gross per 24 hour  Intake 464.32 ml  Output -  Net 464.32 ml   Filed Weights   08/22/19 0500  Weight: 79.4 kg  Examination:  General exam: Appears calm and comfortable, on room air.  Chronically sick looking.   Respiratory system: Clear to auscultation. Respiratory effort normal. Cardiovascular system: S1 & S2 heard, RRR. No JVD, murmurs, rubs, gallops or clicks.  Left arm with chronic lymphedema. Gastrointestinal system: Abdomen is nondistended, soft and nontender. No organomegaly or masses felt. Normal bowel sounds heard. Central nervous system: Alert and  oriented. No focal neurological deficits.  She can move all extremities with no deficits. Extremities: Symmetric 5 x 5 power. Skin: No rashes, lesions or ulcers Psychiatry: Judgement and insight appear normal. Mood & affect anxious.    Data Reviewed: I have personally reviewed following labs and imaging studies  CBC: Recent Labs  Lab 08/17/19 1133 08/20/19 1126 08/21/19 0633  WBC 9.0 11.3* 7.6  NEUTROABS  --  9.4*  --   HGB 15.1* 15.3* 13.7  HCT 45.7 45.7 42.3  MCV 94.2 90.0 93.0  PLT 172 182 [id number]   Basic Metabolic Panel: Recent Labs  Lab 08/17/19 1133 08/20/19 1126 08/21/19 0633  NA 140 137 137  K 2.9* 4.0 3.8  CL 102 103 104  CO2 27 24 23   GLUCOSE 115* 90 128*  BUN 10 11 13   CREATININE 0.48 0.60 0.44  CALCIUM 9.0 8.9 7.8*   GFR: Estimated Creatinine Clearance: 75 mL/min (by C-G formula based on SCr of 0.44 mg/dL). Liver Function Tests: Recent Labs  Lab 08/17/19 1133 08/20/19 1126  AST 281* 294*  ALT 224* 232*  ALKPHOS 747* 875*  BILITOT 4.2* 3.8*  PROT 6.4* 6.4*  ALBUMIN 2.8* 2.6*   No results for input(s): LIPASE, AMYLASE in the last 168 hours. No results for input(s): AMMONIA in the last 168 hours. Coagulation Profile: No results for input(s): INR, PROTIME in the last 168 hours. Cardiac Enzymes: No results for input(s): CKTOTAL, CKMB, CKMBINDEX, TROPONINI in the last 168 hours. BNP (last 3 results) No results for input(s): PROBNP in the last 8760 hours. HbA1C: No results for input(s): HGBA1C in the last 72 hours. CBG: Recent Labs  Lab 08/21/19 1414 08/21/19 1725 08/21/19 2023 08/22/19 0751 08/22/19 1209  GLUCAP 150* 153* 169* 180* 222*   Lipid Profile: No results for input(s): CHOL, HDL, LDLCALC, TRIG, CHOLHDL, LDLDIRECT in the last 72 hours. Thyroid Function Tests: No results for input(s): TSH, T4TOTAL, FREET4, T3FREE, THYROIDAB in the last 72 hours. Anemia Panel: No results for input(s): VITAMINB12, FOLATE, FERRITIN, TIBC, IRON,  RETICCTPCT in the last 72 hours. Sepsis Labs: Recent Labs  Lab 08/17/19 1149  LATICACIDVEN 2.1*    Recent Results (from the past 240 hour(s))  Urine culture     Status: None   Collection Time: 08/17/19  2:25 PM   Specimen: Urine, Random  Result Value Ref Range Status   Specimen Description   Final    URINE, RANDOM Performed at [facility] [address], [place], [place] [address]    Special Requests   Final    NONE Performed at [facility], [place] [address] [place], [place] [address]    Culture   Final    Multiple bacterial morphotypes present, none predominant. Suggest appropriate recollection if clinically indicated.   Report Status 08/19/2019 FINAL  Final  Urine Culture     Status: Abnormal   Collection Time: 08/20/19  3:42 PM   Specimen: Urine, Clean Catch  Result Value Ref Range Status   Specimen Description   Final    URINE, CLEAN CATCH Performed at [facility], [place] [address], [place], [place] [address]    Special Requests   Final    NONE Performed at [facility], [place] [address] [place], [place] [address]    Culture MULTIPLE SPECIES PRESENT, SUGGEST RECOLLECTION (A)  Final   Report Status 08/22/2019 FINAL  Final  SARS CORONAVIRUS 2 (TAT 6-24 HRS) Nasopharyngeal Nasopharyngeal Swab     Status: None   Collection Time: 08/20/19 10:59 PM   Specimen: Nasopharyngeal Swab  Result Value Ref Range Status   SARS Coronavirus 2 NEGATIVE NEGATIVE Final    Comment: (NOTE) SARS-CoV-2 target nucleic acids are NOT DETECTED. The SARS-CoV-2 RNA is generally detectable in upper and lower respiratory specimens during the acute phase of infection. Negative results do not preclude SARS-CoV-2 infection, do not rule out co-infections with other pathogens, and should not be used as the sole basis for treatment or other patient management decisions. Negative results must be combined with  clinical observations, patient history, and epidemiological information. The expected result is Negative. Fact Sheet for Patients: SugarRoll.be Fact Sheet for Healthcare Providers: https://www.woods-mathews.com/ This test is not yet approved or cleared by the [place] FDA and  has been authorized for detection and/or diagnosis of SARS-CoV-2 by FDA under an Emergency Use Authorization (EUA). This EUA will remain  in effect (meaning this test can be used) for the duration of the COVID-19 declaration under Section 56 4(b)(1) of the Act, 21 U.S.C. section 360bbb-3(b)(1), unless the authorization is terminated or revoked sooner. Performed at [facility], [place] [address] [place], [place] [address]          Radiology Studies: Ct Head W Or Wo Contrast  Result Date: 08/20/2019 CLINICAL DATA:  Altered mental status. Metastatic breast cancer. EXAM: CT HEAD WITHOUT AND WITH CONTRAST TECHNIQUE: Contiguous axial images were obtained from the base of the skull through the vertex without and with intravenous contrast CONTRAST:  33mL OMNIPAQUE IOHEXOL 300 MG/ML  SOLN COMPARISON:  Head MRI 06/07/2019 FINDINGS: Brain: There is extensive, confluent hypoattenuation throughout the white matter of the left frontal lobe extending into the left temporal lobe, markedly increased from the prior MRI and consistent with worsening edema. New moderately extensive edema is present in the right frontal lobe. There is associated sulcal effacement in these regions without midline shift. There is abnormal enhancement over the left greater than right cerebral convexities which is both nodular and curvilinear and corresponds to known dural metastatic disease. This is better demonstrated by MRI than CT, however there is concern for interval progression with leptomeningeal involvement a possibility given more notable sulcal enhancement today compared to the prior MRI. No acute  cortically based infarct is identified. The lateral and third ventricles are partially compressed due to edema. The basilar cisterns are patent. Marked bilateral inferior cerebellar atrophy is again noted. An 11 mm enhancing extra-axial mass in the posterior fossa posterior to the right jugular foramen is unchanged and compatible with a meningioma. A presumed partially calcified meningioma in the right prepontine cistern is unchanged as well. No acute intracranial hemorrhage or extra-axial fluid collection is identified. Vascular: No suspicious noncontrast vascular hyperdensity. Grossly patent major dural venous sinuses. Skull: No destructive skull lesion. Sinuses/Orbits: Visualized paranasal sinuses and mastoid air cells are clear. Orbits are unremarkable. Other: None. IMPRESSION: 1. Extensive left greater than right frontal lobe edema extending into the left temporal lobe, greatly progressed from the prior MRI. Regional mass effect without midline shift. 2. Enhancement over the left greater than right  cerebral convexities corresponding to known dural metastases. Contrast-enhanced brain MRI is recommended to evaluate for suspected progression and for the possibility of leptomeningeal involvement. Electronically Signed   By: Logan Bores M.D.   On: 08/20/2019 21:47        Scheduled Meds: . Chlorhexidine Gluconate Cloth  6 each Topical Daily  . dexamethasone (DECADRON) injection  6 mg Intravenous Q6H  . enoxaparin (LOVENOX) injection  40 mg Subcutaneous Daily  . famotidine  20 mg Oral BID  . insulin aspart  0-5 Units Subcutaneous QHS  . insulin aspart  0-9 Units Subcutaneous TID WC  . levETIRAcetam  1,000 mg Oral BID  . mouth rinse  15 mL Mouth Rinse BID  . nicotine  21 mg Transdermal Daily  . sodium chloride flush  10-40 mL Intracatheter Q12H   Continuous Infusions:    LOS: 1 day    Time spent: 25 minutes    Barb Merino, MD [facility] Pager [phone]  If 7PM-7AM, please  contact night-coverage [email or web address] Password [email or web address] 08/22/2019, 12:41 PM

## 2019-08-22 NOTE — Progress Notes (Addendum)
HEMATOLOGY-ONCOLOGY PROGRESS NOTE  SUBJECTIVE: The patient is awake and alert today.  She is sitting up in her recliner.  Her sister has arrived from [place], [place].  Her sister notes some mild confusion.  Has not had any recurrent seizures.  Denies headaches and dizziness.  Denies chest pain, shortness of breath, abdominal pain, nausea, vomiting.  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 ([place])  01/21/2017 Initial Diagnosis   Metastatic breast cancer ([place])   07/19/2018 - 11/20/2018 Chemotherapy   The patient had DOXOrubicin (ADRIAMYCIN) chemo injection 36 mg, 20 mg/m2 = 36 mg (66.7 % of original dose 30 mg/m2), Intravenous,  Once, 6 of 6 cycles Dose modification: 30 mg/m2 (original dose 30 mg/m2, Cycle 1, Reason: Change in LFTs), 20 mg/m2 (original dose 30 mg/m2, Cycle 1, Reason: Change in LFTs), 30 mg/m2 (original dose 30 mg/m2, Cycle 3, Reason: Change in LFTs), 50 mg/m2 (original dose 30 mg/m2, Cycle 4, Reason: Provider Judgment) Administration: 36 mg (07/19/2018), 36 mg (08/09/2018), 54 mg (08/30/2018), 90 mg (09/20/2018), 90 mg (10/10/2018), 90 mg (10/31/2018) palonosetron (ALOXI) injection 0.25 mg, 0.25 mg, Intravenous,  Once, 6 of 6 cycles Administration: 0.25 mg (07/19/2018), 0.25 mg (08/09/2018), 0.25 mg (08/30/2018), 0.25 mg (09/20/2018), 0.25 mg (10/10/2018), 0.25 mg (10/31/2018) pegfilgrastim-cbqv (UDENYCA) injection 6 mg, 6 mg, Subcutaneous, Once, 6 of 6 cycles Administration: 6 mg (08/11/2018), 6 mg (09/01/2018), 6 mg (09/22/2018), 6 mg (10/12/2018), 6 mg (11/01/2018) cyclophosphamide (CYTOXAN) 720 mg in sodium chloride 0.9 % 250 mL chemo infusion, 400 mg/m2 = 720 mg (100 % of original dose 400 mg/m2), Intravenous,  Once, 6 of 6 cycles Dose modification: 400 mg/m2 (original dose 400 mg/m2, Cycle 1, Reason: Change in LFTs), 500 mg/m2 (original dose 400 mg/m2, Cycle 3, Reason: Change in LFTs) Administration: 720 mg (07/19/2018), 720 mg  (08/09/2018), 900 mg (08/30/2018), 900 mg (09/20/2018), 900 mg (10/10/2018), 900 mg (10/31/2018) fosaprepitant (EMEND) 150 mg, dexamethasone (DECADRON) 12 mg in sodium chloride 0.9 % 145 mL IVPB, , Intravenous,  Once, 6 of 6 cycles Administration:  (07/19/2018),  (08/09/2018),  (08/30/2018),  (09/20/2018),  (10/10/2018),  (10/31/2018)  for chemotherapy treatment.    03/29/2019 -  Chemotherapy   The patient had palonosetron (ALOXI) injection 0.25 mg, 0.25 mg, Intravenous,  Once, 1 of 1 cycle Administration: 0.25 mg (03/29/2019), 0.25 mg (04/05/2019) pegfilgrastim (NEULASTA) injection 6 mg, 6 mg, Subcutaneous, Once, 1 of 1 cycle pegfilgrastim (NEULASTA ONPRO KIT) injection 6 mg, 6 mg, Subcutaneous, Once, 3 of 3 cycles Administration: 6 mg (05/17/2019), 6 mg (06/14/2019) PACLitaxel (TAXOL) 150 mg in sodium chloride 0.9 % 250 mL chemo infusion (</= 76m/m2), 80 mg/m2 = 150 mg, Intravenous,  Once, 4 of 4 cycles Administration: 150 mg (03/29/2019), 150 mg (04/05/2019), 150 mg (05/03/2019), 150 mg (05/10/2019), 150 mg (05/17/2019), 150 mg (05/31/2019), 150 mg (06/14/2019), 150 mg (06/07/2019), 150 mg (06/28/2019) ondansetron (ZOFRAN) 12 mg, dexamethasone (DECADRON) 10 mg in sodium chloride 0.9 % 50 mL IVPB, , Intravenous,  Once, 3 of 3 cycles Administration:  (05/03/2019),  (05/10/2019),  (05/17/2019),  (05/31/2019),  (06/07/2019),  (06/14/2019),  (06/28/2019)  for chemotherapy treatment.    [facility] to lung ([facility]  01/22/2017 Initial Diagnosis   M[facility] [facility] to lung ([facility]   03/29/2019 -  Chemotherapy   The patient had palonosetron (ALOXI) injection 0.25 mg, 0.25 mg, Intravenous,  Once, 1 of 4 cycles Administration: 0.25 mg (03/29/2019), 0.25 mg (04/05/2019) pegfilgrastim (NEULASTA) injection 6 mg, 6 mg, Subcutaneous, Once, 1  of 1 cycle pegfilgrastim (NEULASTA ONPRO KIT) injection 6 mg, 6 mg, Subcutaneous, Once, 0 of 3 cycles PACLitaxel (TAXOL) 150 mg in sodium chloride 0.9 % 250 mL chemo infusion  (</= 76m/m2), 80 mg/m2 = 150 mg, Intravenous,  Once, 1 of 4 cycles Administration: 150 mg (03/29/2019), 150 mg (04/05/2019)  for chemotherapy treatment.    Liver metastases ([facility]  07/12/2018 Initial Diagnosis   Liver metastases ([facility]   07/19/2018 - 11/20/2018 Chemotherapy   The patient had DOXOrubicin (ADRIAMYCIN) chemo injection 36 mg, 20 mg/m2 = 36 mg (66.7 % of original dose 30 mg/m2), Intravenous,  Once, 6 of 6 cycles Dose modification: 30 mg/m2 (original dose 30 mg/m2, Cycle 1, Reason: Change in LFTs), 20 mg/m2 (original dose 30 mg/m2, Cycle 1, Reason: Change in LFTs), 30 mg/m2 (original dose 30 mg/m2, Cycle 3, Reason: Change in LFTs), 50 mg/m2 (original dose 30 mg/m2, Cycle 4, Reason: Provider Judgment) Administration: 36 mg (07/19/2018), 36 mg (08/09/2018), 54 mg (08/30/2018), 90 mg (09/20/2018), 90 mg (10/10/2018), 90 mg (10/31/2018) palonosetron (ALOXI) injection 0.25 mg, 0.25 mg, Intravenous,  Once, 6 of 6 cycles Administration: 0.25 mg (07/19/2018), 0.25 mg (08/09/2018), 0.25 mg (08/30/2018), 0.25 mg (09/20/2018), 0.25 mg (10/10/2018), 0.25 mg (10/31/2018) pegfilgrastim-cbqv (UDENYCA) injection 6 mg, 6 mg, Subcutaneous, Once, 6 of 6 cycles Administration: 6 mg (08/11/2018), 6 mg (09/01/2018), 6 mg (09/22/2018), 6 mg (10/12/2018), 6 mg (11/01/2018) cyclophosphamide (CYTOXAN) 720 mg in sodium chloride 0.9 % 250 mL chemo infusion, 400 mg/m2 = 720 mg (100 % of original dose 400 mg/m2), Intravenous,  Once, 6 of 6 cycles Dose modification: 400 mg/m2 (original dose 400 mg/m2, Cycle 1, Reason: Change in LFTs), 500 mg/m2 (original dose 400 mg/m2, Cycle 3, Reason: Change in LFTs) Administration: 720 mg (07/19/2018), 720 mg (08/09/2018), 900 mg (08/30/2018), 900 mg (09/20/2018), 900 mg (10/10/2018), 900 mg (10/31/2018) fosaprepitant (EMEND) 150 mg, dexamethasone (DECADRON) 12 mg in sodium chloride 0.9 % 145 mL IVPB, , Intravenous,  Once, 6 of 6 cycles Administration:  (07/19/2018),  (08/09/2018),  (08/30/2018),   (09/20/2018),  (10/10/2018),  (10/31/2018)  for chemotherapy treatment.    03/29/2019 -  Chemotherapy   The patient had palonosetron (ALOXI) injection 0.25 mg, 0.25 mg, Intravenous,  Once, 1 of 4 cycles Administration: 0.25 mg (03/29/2019), 0.25 mg (04/05/2019) pegfilgrastim (NEULASTA) injection 6 mg, 6 mg, Subcutaneous, Once, 1 of 1 cycle pegfilgrastim (NEULASTA ONPRO KIT) injection 6 mg, 6 mg, Subcutaneous, Once, 0 of 3 cycles PACLitaxel (TAXOL) 150 mg in sodium chloride 0.9 % 250 mL chemo infusion (</= 824mm2), 80 mg/m2 = 150 mg, Intravenous,  Once, 1 of 4 cycles Administration: 150 mg (03/29/2019), 150 mg (04/05/2019)  for chemotherapy treatment.    [facility] Initial Diagnosis   [facility]  03/29/2019 -  Chemotherapy   The patient had palonosetron (ALOXI) injection 0.25 mg, 0.25 mg, Intravenous,  Once, 1 of 4 cycles Administration: 0.25 mg (03/29/2019), 0.25 mg (04/05/2019) pegfilgrastim (NEULASTA) injection 6 mg, 6 mg, Subcutaneous, Once, 1 of 1 cycle pegfilgrastim (NEULASTA ONPRO KIT) injection 6 mg, 6 mg, Subcutaneous, Once, 0 of 3 cycles PACLitaxel (TAXOL) 150 mg in sodium chloride 0.9 % 250 mL chemo infusion (</= 8044m2), 80 mg/m2 = 150 mg, Intravenous,  Once, 1 of 4 cycles Administration: 150 mg (03/29/2019), 150 mg (04/05/2019)  for chemotherapy treatment.       REVIEW OF SYSTEMS:   Constitutional: Denies fevers, chills or abnormal weight loss Eyes: Denies blurriness of vision Ears,  nose, mouth, throat, and face: Denies mucositis or sore throat Respiratory: Denies cough, dyspnea or wheezes Cardiovascular: Denies palpitation, chest discomfort Gastrointestinal:  Denies nausea, heartburn or change in bowel habits Skin: Denies abnormal skin rashes Lymphatics: Denies new lymphadenopathy or easy bruising Neurological:Denies numbness, tingling or new weaknesses Behavioral/Psych: Mood is stable, no new changes   Extremities: No lower extremity edema All other systems were reviewed with the patient and are negative.  I have reviewed the past medical history, past surgical history, social history and family history with the patient and they are unchanged from previous note.   PHYSICAL EXAMINATION: ECOG PERFORMANCE STATUS: 3 - Symptomatic, >50% confined to bed  Vitals:   08/21/19 2021 08/22/19 0531  BP: 102/81 115/77  Pulse: 94 82  Resp: 16 16  Temp: 97.6 F (36.4 C) 97.9 F (36.6 C)  SpO2: 97% 100%   Filed Weights   08/22/19 0500  Weight: 175 lb 0.7 oz (79.4 kg)    Intake/Output from previous day: 09/29 0701 - 09/30 0700 In: 464.3 [P.O.:120; IV Piggyback:344.3] Out: -   GENERAL:alert, no distress and comfortable SKIN: skin color, texture, turgor are normal, no rashes or significant lesions EYES: Scleral icterus noted. OROPHARYNX:no exudate, no erythema and lips, buccal mucosa, and tongue normal  NECK: supple, thyroid normal size, non-tender, without nodularity LYMPH:  no palpable lymphadenopathy in the cervical, axillary or inguinal LUNGS: clear to auscultation and percussion with normal breathing effort HEART: regular rate & rhythm and no murmurs and no lower extremity edema ABDOMEN:abdomen soft, non-tender and normal bowel sounds Musculoskeletal:no cyanosis of digits and no clubbing  NEURO: alert & oriented x 3 with fluent speech, no focal motor/sensory deficits  LABORATORY DATA:  I have reviewed the data as listed CMP Latest Ref Rng & Units 08/21/2019 08/20/2019 08/17/2019  Glucose 70 - 99 mg/dL 128(H) 90 115(H)  BUN 8 - 23 mg/dL 13 11 10   Creatinine 0.44 - 1.00 mg/dL 0.44 0.60 0.48  Sodium 135 - 145 mmol/L 137 137 140  Potassium 3.5 - 5.1 mmol/L 3.8 4.0 2.9(L)  Chloride 98 - 111 mmol/L 104 103 102  CO2 22 - 32 mmol/L 23 24 27   Calcium 8.9 - 10.3 mg/dL 7.8(L) 8.9 9.0  Total Protein 6.5 - 8.1 g/dL - 6.4(L) 6.4(L)  Total Bilirubin 0.3 - 1.2 mg/dL - 3.8(HH) 4.2(H)  Alkaline  Phos 38 - 126 U/L - 875(H) 747(H)  AST 15 - 41 U/L - 294(HH) 281(H)  ALT 0 - 44 U/L - 232(H) 224(H)    Lab Results  Component Value Date   WBC 7.6 08/21/2019   HGB 13.7 08/21/2019   HCT 42.3 08/21/2019   MCV 93.0 08/21/2019   PLT 159 08/21/2019   NEUTROABS 9.4 (H) 08/20/2019    Ct Head W Or Wo Contrast  Result Date: 08/20/2019 CLINICAL DATA:  Altered mental status. Metastatic breast cancer. EXAM: CT HEAD WITHOUT AND WITH CONTRAST TECHNIQUE: Contiguous axial images were obtained from the base of the skull through the vertex without and with intravenous contrast CONTRAST:  71m OMNIPAQUE IOHEXOL 300 MG/ML  SOLN COMPARISON:  Head MRI 06/07/2019 FINDINGS: Brain: There is extensive, confluent hypoattenuation throughout the white matter of the left frontal lobe extending into the left temporal lobe, markedly increased from the prior MRI and consistent with worsening edema. New moderately extensive edema is present in the right frontal lobe. There is associated sulcal effacement in these regions without midline shift. There is abnormal enhancement over the left greater than right cerebral convexities which  is both nodular and curvilinear and corresponds to known dural metastatic disease. This is better demonstrated by MRI than CT, however there is concern for interval progression with leptomeningeal involvement a possibility given more notable sulcal enhancement today compared to the prior MRI. No acute cortically based infarct is identified. The lateral and third ventricles are partially compressed due to edema. The basilar cisterns are patent. Marked bilateral inferior cerebellar atrophy is again noted. An 11 mm enhancing extra-axial mass in the posterior fossa posterior to the right jugular foramen is unchanged and compatible with a meningioma. A presumed partially calcified meningioma in the right prepontine cistern is unchanged as well. No acute intracranial hemorrhage or extra-axial fluid collection  is identified. Vascular: No suspicious noncontrast vascular hyperdensity. Grossly patent major dural venous sinuses. Skull: No destructive skull lesion. Sinuses/Orbits: Visualized paranasal sinuses and mastoid air cells are clear. Orbits are unremarkable. Other: None. IMPRESSION: 1. Extensive left greater than right frontal lobe edema extending into the left temporal lobe, greatly progressed from the prior MRI. Regional mass effect without midline shift. 2. Enhancement over the left greater than right cerebral convexities corresponding to known dural metastases. Contrast-enhanced brain MRI is recommended to evaluate for suspected progression and for the possibility of leptomeningeal involvement. Electronically Signed   By: Logan Bores M.D.   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 Multifocal tumor in the left breast with biopsy-proven left axillary metastases.  Noted to have extensive bone metastases and pulmonary metastases. Patient was noted to have calvarial metastases but no overt parenchymal metastasis 09/14/17 CT Chest/ad/pelvis Results discussed in details - good response.  02/22/18 CT revealed Newly apparent 2.1 by 2.0 cm rim enhancing lesion in the right hepatic lobe suspicious for a metastatic lesion. This was not apparent on prior exams although the prior exams were all noncontrast and thus the lesion may have been present but with reduced conspicuity. There is also a small enhancing lesion further posteriorly in the right hepatic lobe which is technically nonspecific and could be a small hemangioma or a small metastatic lesion. Stable distribution and appearance of prior sclerotic osseous metastatic disease without bony progression. 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    07/06/18 CT C/A/P which revealed New pattern of heterogeneous enhancement and enhancing ill-defined lesions in the central LEFT and RIGHT hepatic lobe at site prior enhancing lesion is highly concerning for progression of of infiltrative malignancy in the liver occupying a large portion of the RIGHT hepatic lobe and central LEFT hepatic lobe. 2. Small amount of free fluid in the abdomen pelvis. 3. No metastatic adenopathy.4. No evidence pulmonary metastasis or lymphadenopathy. 5. Stable dense sclerotic skeletal metastasis   07/11/18 ECHO revealed LV EF of 55%-60%   07/18/18 Liver needle/core biopsy right lesion consi with Ki67 at 40%, ER/PR +ve   07/28/18 MRI Abdomen MRCP revealed Extensive hepatic metastatic disease, possibly progressive from recent abdominal CT. 2. No evidence of intrahepatic or extrahepatic biliary dilatation. Elevated liver function studies may be secondary to intrahepatic cholestasis. 3. Increased ascites without peritoneal nodularity or suspicious enhancement. 4. Grossly stable widespread osseous metastatic disease.   S/p 6 cycles of AC completed on 11/01/18  03/20/19 CT C/A/P revealed "Although the dominant liver lesion appears improved in the interval, there appears to be 2 new liver metastases  on today's exam, concerning for disease progression. 2. Similar appearance of the widespread sclerotic bone metastases. 3. Interval decrease in ascites. 4.Aortic Atherosclerois."  Pt's planned third weekly infusion of Taxol was cancelled due to dental abscess s/p antibiotic course. Then cancelled again due to thrombocytopenia thought to be due to antibiotic course.  07/19/19 CT C/A/P revealed "1. Reduction in size and degree of enhancement in the liver lesions, most notably in the lesion posteriorly in the right hepatic lobe. Irregular hepatic morphology likely due to a combination of tumor and cirrhosis. 2. Mild increase in sclerosis of some of the pre-existing bony lesions. Given  the improvement in the liver lesions, this may represent a positive response to therapy rather than a worsening. 3. Other imaging findings of potential clinical significance: Aortic Atherosclerosis (ICD10-I70.0). Sigmoid colon diverticulosis."  07/19/19 bone scan revealed "Progressive osseous metastatic disease."  #2 Bone metastasesdue to breast cancer- on Xgeva. Much improved back pain.  #3 Increased LFTs due to liver metastases  #5 Cancer related pain -controlled - status post palliative RT to spine - no currently needing any pain medications.  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 No other new medication. Now resolved. Monitoring on increasing doses of Verzenio. No issues with recurrent rash on Verzenio 125m po BID  #7 Brain metastasis  07/20/18 CT Head revealed 1.8 x 2.4 x 3.9 cm LEFT frontal cortical based mass with possible dural component highly concerning for metastatic disease, less likely abscess or cerebritis. Severe vasogenic edema with 5 mm LEFT to RIGHT subfalcine herniation. No ventricular entrapment. 2. 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 3. Multiple calvarial metastasis. 4. Cerebellar atrophy.   07/21/18 MRI Brain revealed Irregular plaque-like mass, likely metastasis, measuring up to 5.1 cm, appears to originate from the left anterolateral frontal dura with invasion of the underlying frontal lobe of the brain. Mass effect from brain edema and the lesion partially effaces the frontal horn of left lateral ventricle and results in 7 mm left-to-right midline shift of anterior septum pellucidum. 2. Diffuse dural thickening over the left cerebral convexity may represent edema associated with the tumor or invasive neoplasm. 3. 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   07/28/18 MRI Brain revealed Extensive nodular dural enhancement left frontal lobe with  associated enhancing mass growing in the left frontal lobe is unchanged in size and compatible with metastatic disease. This is associated with metastatic disease to the left frontal bone. Extensive edema in the left frontal lobe has progressed in the interval. Blastic metastatic disease throughout the calvarium. Metastatic disease in the cervical spine. Enhancing mass along the floor of the posterior fossa on the right is stable and most consistent with meningioma. 6 mm enhancing mass in the prepontine cistern on the right also unchanged and compatible with meningioma.   s/p SRS radiation therapy  10/23/18 MRI Brain revealed SRS protocol demonstrating treatment response: Regression of LEFT frontal dural metastasis, decreased vasogenic edema and parenchymal invasion. Similar calvarial metastasis. 2. Stable appearance of 2 small posterior fossa meningiomas. 3. No acute intracranial process.   02/02/19 MRI Brain revealed "Marked progression of dural disease left greater than right as described above. Pronounced brain edema, left more than right with left-to-right shift of 1 cm. 2. I do not identify any brain parenchymal metastases. 3. Small stable incidental meningiomas in the posterior fossa as above."  Pt is s/p SRS for dural metastases.  06/07/2019 MRI brain  w and wo contrast revealed "1. Progression of disease disease as evidenced by new dural metastasis along the inferolateral left temporal lobe with mild adjacent brain edema, see image 13:43. 2. Positive response at the intervally treated diffuse dural metastatic disease elsewhere along the bilateral cerebral convexity. 3. Posterior fossa meningiomas are stable. 4. No primary parenchymal metastasis."  08/20/19 CT of the head with and without contrast revealed "1. Extensive left greater than right frontal lobe edema extending into the left temporal lobe, greatly progressed from the prior MRI. Regional mass effect without midline shift. 2.  Enhancement over the left greater than right cerebral convexities corresponding to known dural metastases. Contrast-enhanced brain MRI is recommended to evaluate for suspected progression and for the possibility of leptomeningeal involvement."  #8 Vitamin D deficiency Vit D levels improved to 44.8 On 12/01/2018 -continue Vit D replacement.  #9 h/o Left upper extremity swelling- [place] neg for DVT - now swelling resolved.   -The patient is more awake and alert today.  We discussed the CT head findings.  We discussed recommendation from neuro oncology regarding pursuing an MRI of the brain with and without contrast.  The patient would like to proceed with this.  I have entered this order today. -Continue dexamethasone 6 mg every 6 hours -Continue Keppra -Further discussion regarding prognosis and goals of care pending MRI results. -The patient's sister has expressed that she would like to bring the patient to [place], [place] after discharge.  The patient already has an appointment scheduled with [facility] to establish oncology care in [place].  This appointment is currently scheduled for 08/28/2019.    LOS: 1 day   Mikey Bussing, DNP, AGPCNP-BC, AOCNP 08/22/19    ADDENDUM  .Patient was Personally and independently interviewed, examined and relevant elements of the history of present illness were reviewed in details and an assessment and plan was created. All elements of the patient's history of present illness , assessment and plan were discussed in details with Mikey Bussing, DNP. The above documentation reflects our combined findings assessment and plan.  Patient is much more awake and alert today and able to speak much more coherently. She appears to have improved on the high dose steroids.  I had a detailed discussion with the patient in the presence of her sister Claiborne Billings and Venora Maples. She is very clear that she is comfortable right now and know that she is going to die soon and is at ease  with everything. We discussed options for palliative intrathecal chemotherapy + system chemo BEEP vs comfort care through hospice.  Patient is very clear that she does not want consider any chemotherapy or other burdensome life prolonging cares. She chooses to go to [place] , [place] with [facility] and enroll in home hospice there.  She expresses gratitude for all her cares in [place]. Proceed supportive counseling.  Sullivan Lone MD MS

## 2019-08-22 NOTE — Progress Notes (Signed)
Initial Nutrition Assessment  INTERVENTION:   -Ensure Enlive po BID, each supplement provides 350 kcal and 20 grams of protein  NUTRITION DIAGNOSIS:   Increased nutrient needs related to cancer and cancer related treatments as evidenced by estimated needs.  GOAL:   Patient will meet greater than or equal to 90% of their needs  MONITOR:   Supplement acceptance, Labs, Weight trends, PO intake, I & O's  REASON FOR ASSESSMENT:   Malnutrition Screening Tool    ASSESSMENT:   63 year old female with history of breast cancer metastasis to lungs, liver, brain and bone on chemotherapy, type 2 diabetes on insulin, depression, seizure disorder who presented to the emergency room with altered mental status.  Patient was at [facility] for chemotherapy and sent to ER because she was found altered and had difficulty finding words.  She was also noted to have right facial twitching and seizures in the emergency room.  **RD working remotely**  Patient currently consuming 100% of meals today. Pt drinks Ensure supplements at home, will order. Per chart review, pt has decided to forgo additional treatments for her breast cancer. Expected to discharge with sister.  Per weight records, pt's weight has remained stable since April 2020.  I/Os: +2.7L since admit  Medications reviewed. Labs reviewed:  CBGs: 220-253  NUTRITION - FOCUSED PHYSICAL EXAM:  Unable to perform- working remotely.  Diet Order:   Diet Order            Diet Carb Modified Fluid consistency: Thin; Room service appropriate? Yes  Diet effective now              EDUCATION NEEDS:   No education needs have been identified at this time  Skin:  Skin Assessment: Reviewed RN Assessment  Last BM:  10/1  Height:   Ht Readings from Last 1 Encounters:  07/20/19 5\' 5"  (1.651 m)    Weight:   Wt Readings from Last 1 Encounters:  08/23/19 79.5 kg    Ideal Body Weight:  56.8 kg  BMI:  Body mass index is 29.17  kg/m.  Estimated Nutritional Needs:   Kcal:  1500-1700  Protein:  70-80g  Fluid:  1.7L/day  Clayton Bibles, MS, RD, LDN Inpatient Clinical Dietitian Pager: [phone] After Hours Pager: [phone]

## 2019-08-22 NOTE — Progress Notes (Signed)
[facility] CSW Progress Notes  Spoke w inpatient CSW Sharren Bridge, encouraged contact w sister Claiborne Billings who reportedly is in town from [place] and wants to be involved in developing most appropriate plan of care for patient at discharge.  CSW Dalene Seltzer has had conversation w sister who indicated wish to take patient to [place], but unsure whether this is feasible/best at this point in time.  Edwyna Shell, LCSW Clinical Social Worker Phone:  [phone] Cell:  [phone]

## 2019-08-22 NOTE — Progress Notes (Signed)
Key Points: Use following P&T approved IV to PO non-antibiotic change policy.  Description contains the criteria that are approved Note: Policy Excludes:  Esophagectomy patientsPHARMACIST - PHYSICIAN COMMUNICATION  CONCERNING: IV to Oral Route Change Policy  RECOMMENDATION: This patient is receiving pepcid by the intravenous route.  Based on criteria approved by the Pharmacy and Therapeutics Committee, the intravenous medication(s) is/are being converted to the equivalent oral dose form(s).   DESCRIPTION: These criteria include:  The patient is eating (either orally or via tube) and/or has been taking other orally administered medications for a least 24 hours  The patient has no evidence of active gastrointestinal bleeding or impaired GI absorption (gastrectomy, short bowel, patient on TNA or NPO).  If you have questions about this conversion, please contact the Pharmacy Department  []   [phone] )  [facility] []   [phone] )  [facility]  []   [phone] )  [facility] [x]   [phone] )  [place], [facility] 08/22/2019 10:48 AM

## 2019-08-22 NOTE — Evaluation (Signed)
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  Patient was at [facility] for chemotherapy and sent to ER because she was found altered and had difficulty finding words.  She was also noted to have right facial twitching and seizures in the emergency room.  Clinical Impression  Pt admitted with above diagnosis.  Unsure of pt baseline, no family present at time of PT eval; recommend SNF vs home with HHPT and 24hr assist  Pt currently with functional limitations due to the deficits listed below (see PT Problem List). Pt will benefit from skilled PT to increase their independence and safety with mobility to allow discharge to the venue listed below.       Follow Up Recommendations SNF;Supervision/Assistance - 24 hour(vs HHPT & 24hr care)    Equipment Recommendations  None recommended by PT    Recommendations for Other Services       Precautions / Restrictions Precautions Precautions: Fall Restrictions Weight Bearing Restrictions: No      Mobility  Bed Mobility Overal bed mobility: Needs Assistance Bed Mobility: Rolling;Sidelying to Sit;Sit to Supine Rolling: Min assist;Mod assist Sidelying to sit: Mod assist   Sit to supine: Min assist   General bed mobility comments: assist to elevate trunk, assist to brign LEs into bed  Transfers Overall transfer level: Needs assistance Equipment used: Rolling walker (2 wheeled) Transfers: Sit to/from Stand Sit to Stand: Mod assist         General transfer comment: cues for hand placement, assist to rise and stabilize. control descent  Ambulation/Gait Ambulation/Gait assistance: Min assist Gait  Distance (Feet): 65 Feet Assistive device: Rolling walker (2 wheeled) Gait Pattern/deviations: Step-through pattern;Decreased stride length;Trunk flexed;Narrow base of support     General Gait Details: assist to maneuver RW and balance, especially with turns. multi-modal cues for RW distance from self and to keep feet inside walker  Stairs            Wheelchair Mobility    Modified Rankin (Stroke Patients Only)       Balance Overall balance assessment: Needs assistance Sitting-balance support: No upper extremity supported;Feet supported Sitting balance-Leahy Scale: Fair       Standing balance-Leahy Scale: Poor Standing balance comment: reliant on UEs for static balance                             Pertinent Vitals/Pain Pain Assessment: No/denies pain    Home Living Family/patient expects to be discharged to:: Unsure Living Arrangements: Alone                    Prior Function           Comments: pt reports she amb with a RW at home; no family present to determine baseline     Hand Dominance        Extremity/Trunk Assessment   Upper Extremity Assessment Upper Extremity Assessment: Generalized weakness    Lower Extremity Assessment Lower Extremity Assessment: Generalized weakness       Communication   Communication: No difficulties  Cognition Arousal/Alertness: Awake/alert Behavior During Therapy: WFL for tasks assessed/performed Overall Cognitive Status: No family/caregiver present to determine baseline cognitive functioning Area  of Impairment: Problem solving;Following commands;Safety/judgement                       Following Commands: Follows one step commands with increased time;Follows multi-step commands inconsistently Safety/Judgement: Decreased awareness of safety;Decreased awareness of deficits   Problem Solving: Slow processing;Decreased initiation;Difficulty sequencing;Requires verbal cues;Requires tactile  cues        General Comments      Exercises     Assessment/Plan    PT Assessment Patient needs continued PT services  PT Problem List Decreased strength;Decreased activity tolerance;Decreased balance;Decreased knowledge of use of DME;Decreased cognition;Decreased mobility       PT Treatment Interventions DME instruction;Gait training;Functional mobility training;Therapeutic activities;Therapeutic exercise;Patient/family education;Balance training    PT Goals (Current goals can be found in the Care Plan section)  Acute Rehab PT Goals PT Goal Formulation: With patient Time For Goal Achievement: 09/05/19 Potential to Achieve Goals: Good    Frequency Min 2X/week   Barriers to discharge        Co-evaluation               AM-PAC PT "6 Clicks" Mobility  Outcome Measure Help needed turning from your back to your side while in a flat bed without using bedrails?: A Lot Help needed moving from lying on your back to sitting on the side of a flat bed without using bedrails?: A Lot Help needed moving to and from a bed to a chair (including a wheelchair)?: A Lot Help needed standing up from a chair using your arms (e.g., wheelchair or bedside chair)?: A Lot Help needed to walk in hospital room?: A Lot Help needed climbing 3-5 steps with a railing? : A Lot 6 Click Score: 12    End of Session Equipment Utilized During Treatment: Gait belt Activity Tolerance: Patient tolerated treatment well Patient left: in bed;with call bell/phone within reach;with bed alarm set(sz pads)   PT Visit Diagnosis: Unsteadiness on feet (R26.81);Difficulty in walking, not elsewhere classified (R26.2)    Time: [id number] PT Time Calculation (min) (ACUTE ONLY): 17 min   Charges:   PT Evaluation $PT Eval Moderate Complexity: 1 Mod          Kenyon Ana, PT  Pager: [phone] Acute Rehab Dept [facility]): [phone]   08/22/2019   [facility] 08/22/2019, 4:22 PM

## 2019-08-22 NOTE — Progress Notes (Signed)
Received DBIV consult. Noted no pending lab work to be obtained. Confirmed order set entered for line care. Chest xray present from 8/27 includes comments on PAC.

## 2019-08-22 NOTE — Progress Notes (Signed)
These preliminary result these preliminary results were noted.  Awaiting final report.

## 2019-08-23 ENCOUNTER — Telehealth: Payer: Self-pay | Admitting: *Deleted

## 2019-08-23 ENCOUNTER — Encounter: Payer: Self-pay | Admitting: General Practice

## 2019-08-23 ENCOUNTER — Other Ambulatory Visit: Payer: Self-pay | Admitting: *Deleted

## 2019-08-23 ENCOUNTER — Other Ambulatory Visit: Payer: Self-pay | Admitting: Radiation Therapy

## 2019-08-23 DIAGNOSIS — C7951 Secondary malignant neoplasm of bone: Secondary | ICD-10-CM

## 2019-08-23 DIAGNOSIS — Z515 Encounter for palliative care: Secondary | ICD-10-CM

## 2019-08-23 DIAGNOSIS — Z66 Do not resuscitate: Secondary | ICD-10-CM

## 2019-08-23 LAB — GLUCOSE, CAPILLARY
Glucose-Capillary: 220 mg/dL — ABNORMAL HIGH (ref 70–99)
Glucose-Capillary: 253 mg/dL — ABNORMAL HIGH (ref 70–99)
Glucose-Capillary: 185 mg/dL — ABNORMAL HIGH (ref 70–99)
Glucose-Capillary: 170 mg/dL — ABNORMAL HIGH (ref 70–99)

## 2019-08-23 MED ORDER — ENSURE ENLIVE PO LIQD
237.0000 mL | Freq: Two times a day (BID) | ORAL | Status: DC
  Administered 2019-08-24 – 2019-08-27 (×4): 237 mL via ORAL

## 2019-08-23 NOTE — TOC Initial Note (Signed)
Transition of Care [facility]) - Initial/Assessment Note    Patient Details  Name: Stacey Cox MRN: [id number] Date of Birth: [birth year]  Transition of Care [facility]) CM/SW Contact:    Nila Nephew, LCSW Phone Number: [phone] 08/23/2019, 4:17 PM  Clinical Narrative:   Pt admitted with acute encephalopathy from home where she resides alone. Has breast cancer with metastases to liver, lungs, brain, bone.  Has decided after discussions with family and providers to focus on comfort care and wants to enlist hospice services at DC. Pt's family lives in [place] and they would like to have to have pt come to area at DC to spend remainder of life close to family. Feel in-home hospice would be preferable however unsure if they have the skills/resources to care for pt at home. Open to inpatient/residential hospice if appropriate/available in that area. Unsure of finances available should assisted living or nursing facility setting be needed.   Pt's sister researched some hospice agencies in her area and requested CSW contact [facility] [phone] and [facility] [phone].   -[facility] - reports they service area for in-home and in-facility hospice care (ALF, SNF, etc.). No "hospice home." GIP admissions are done to area hospitals when pts need symptom management but not for general residential hospice. Is having liaison follow up with CSW with more details tomorrow. -[facility] - same as above. Advised they do facilitate GIP admissions to nearby hospitals [facility] [place]) when pt needing inpatient symptoms management, but again, no "hospice home."  CSW will discuss information with pt/sister again and continue looking for hospice options in [place] appropriate for pt's situation.   Sister inquired about transportation for pt to [place] once destination determined. States that pt can sit up but "not for long and is prone to falling," therefore would not feel safe with her in car for  that long of a drive. Discussed non-emergency medical transport as likely option. Sister aware this would be out of pocket cost. She is researching private medical transport companies and also aware CSW can assist with finding out if PTAR would be option for that distance as well. Will follow up and plan once closer to knowing exact disposition destination.                 Expected Discharge Plan: Home w Hospice Care(versus hospice medical facility)     Patient Goals and CMS Choice Patient states their goals for this hospitalization and ongoing recovery are:: comfort care CMS Medicare.gov Compare Post Acute Care list provided to:: Other (Comment Required)(sister)    Expected Discharge Plan and Services Expected Discharge Plan: Home w Hospice Care(versus hospice medical facility) In-house Referral: Clinical Social Work, Hospice / [facility] Acute Care Choice: (hospice care) Living arrangements for the past 2 months: Single Family Home Expected Discharge Date: (unknown)                                    Prior Living Arrangements/Services Living arrangements for the past 2 months: Single Family Home Lives with:: Self Patient language and need for interpreter reviewed:: No Do you feel safe going back to the place where you live?: No      Need for Family Participation in Patient Care: Yes (Comment) Care giver support system in place?: No (comment)   Criminal Activity/Legal Involvement Pertinent to Current Situation/Hospitalization: No - Comment as needed  Activities of  Daily Living Home Assistive Devices/Equipment: Eyeglasses, CBG Meter, Cane (specify quad or straight), Walker (specify type) ADL Screening (condition at time of admission) Patient's cognitive ability adequate to safely complete daily activities?: No Is the patient deaf or have difficulty hearing?: No Does the patient have difficulty seeing, even when wearing glasses/contacts?: No Does the patient  have difficulty concentrating, remembering, or making decisions?: Yes Patient able to express need for assistance with ADLs?: No Does the patient have difficulty dressing or bathing?: Yes Independently performs ADLs?: No Communication: Needs assistance Is this a change from baseline?: Change from baseline, expected to last >3 days Dressing (OT): Dependent Is this a change from baseline?: Change from baseline, expected to last >3 days Grooming: Dependent Is this a change from baseline?: Change from baseline, expected to last >3 days Feeding: Dependent Is this a change from baseline?: Change from baseline, expected to last >3 days Bathing: Dependent Is this a change from baseline?: Change from baseline, expected to last >3 days Toileting: Dependent Is this a change from baseline?: Change from baseline, expected to last >3days In/Out Bed: Dependent Is this a change from baseline?: Change from baseline, expected to last >3 days Walks in Home: Dependent Is this a change from baseline?: Change from baseline, expected to last >3 days Does the patient have difficulty walking or climbing stairs?: Yes(secondary to weakness) Weakness of Legs: Both Weakness of Arms/Hands: Both  Permission Sought/Granted Permission sought to share information with : Family Supports Permission granted to share information with : Yes, Verbal Permission Granted  Share Information with NAME: sister Claiborne Billings [phone]           Emotional Assessment Appearance:: Appears stated age     Orientation: : Oriented to Self, Oriented to Place, Oriented to  Time, Oriented to Situation Alcohol / Substance Use: Not Applicable Psych Involvement: No (comment)  Admission diagnosis:  Brain edema ([place]) [G93.6] Seizure ([employer]) [R56.9] Metastatic cancer ([place]) [C79.9] Brain metastases ([place]) [C79.31] Malignant neoplasm of breast metastatic to brain, unspecified laterality ([place]) [C50.919, C79.31] Patient Active Problem List    Diagnosis Date Noted  . 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 Diabetes mellitus without complication ([place]) [id number]  . Tobacco abuse 08/21/2019  . Metastatic cancer ([place])   . Seizure ([place])   . Acute metabolic encephalopathy [id number]  . Leukocytosis 08/20/2019  . Type 2 diabetes mellitus with hyperglycemia, without long-term current use of insulin ([place]) 07/23/2019  . Hyperglycemia 07/23/2019  . Palliative care by specialist   . DNR (do not resuscitate)   . Expressive aphasia   . Brain metastasis ([place]) 01/27/2019  . Seizures ([place]) 07/21/2018  . Macrocytic anemia 07/21/2018  . Thrombocytopenia ([place]) 07/21/2018  . Mild renal insufficiency 07/21/2018  . Anxiety 07/21/2018  . Brain metastases ([place])   . Malignant neoplasm of female breast ([place])   . Breast cancer metastasized to brain ([place]) 07/20/2018  . Port-A-Cath in place 07/17/2018  . Liver metastases ([place]) 07/12/2018  . Genetic testing 08/08/2017  . Palliative care status   . Counseling regarding advance care planning and goals of care 03/25/2017  . 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 Nausea without vomiting 03/21/2017  . Bone metastases ([place]) 02/01/2017  . 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 Malignant neoplasm metastatic to lung ([place])   . Cancer associated pain   . Carcinoma of breast metastatic to brain ([place]) 01/21/2017  . Pathologic fracture   . Breast mass, left    PCP:  [name], MD Pharmacy:   [facility] [id number], [address] [place] [address] Phone: [phone] Fax: [phone]     Social Determinants of Health (SDOH) Interventions    Readmission Risk Interventions No flowsheet data found.

## 2019-08-23 NOTE — Progress Notes (Signed)
Have spoken with cancer center CSWs and with PMT re pt's request to go to [place] with family upon DC under hospice care. Left voicemail with pt's sister to discuss in detail.   Sharren Bridge, MSW, LCSW Transitions of Care 08/23/2019 [phone]

## 2019-08-23 NOTE — Progress Notes (Signed)
Brief oncology note:  I stopped by to visit with the patient today.  I confirmed with her that she plans to discharge home with hospice.  She does not want to pursue any additional treatment for her breast cancer.  The patient's sister plans to bring her to [place], [place].  Discussed with the hospitalist who has consulted care management to assist with arranging for hospice in [place], [place] upon discharge.  We will sign off on [name] at this time.  Please contact medical oncology if any questions arise.  Mikey Bussing, DNP, AGPCNP-BC, AOCNP

## 2019-08-23 NOTE — Consult Note (Signed)
Consultation Note Date: 08/23/2019   Patient Name: Stacey Cox  DOB: [birth year]  MRN: [id number]  Age / Sex: 63 y.o., female  PCP: [name], MD Referring Physician: Barb Merino, MD  Reason for Consultation: Establishing goals of care  HPI/Patient Profile: 63 y.o. female  with past medical history of metastatic breast cancer to bone, lung, liver, and brain, DM type 2, seizures, depression admitted on 08/20/2019 from cancer center with AMS, difficulty finding words. Right facial twitching and seizures in the ED. CT head shows extensive left greater than right frontal lobe edema extending into the left temporal lobe. Loaded with dexamethasone and Keppra. Oncology following with options for palliative intrathecal chemotherapy + systemic chemo vs. Comfort measures/hospice. Palliative medicine consultation for goals of care.   Clinical Assessment and Goals of Care:  I have reviewed medical records, discussed with care team and met with patient and sister Claiborne Billings) at bedside to discuss diagnosis, prognosis, GOC, EOL wishes, disposition and options.  Introduced Palliative Medicine as specialized medical care for people living with serious illness. It focuses on providing relief from the symptoms and stress of a serious illness. The goal is to improve quality of life for both the patient and the family.  We discussed a brief life review of the patient. Prior to admission, living at home alone. She was completely independent up until ~3 weeks ago, when her sister reports increased weakness, falls, and seizure activity. Diagnosed with metastatic breast cancer in 2018, followed by Dr. Irene Limbo. Notes reviewed.   Discussed events leading up to admission and course of hospitalization including diagnoses, interventions, and plan of care. Linwood Dibbles and [place] spoke with Dr. Irene Limbo yesterday evening, and understand worsening  brain metastases and poor prognosis. We discussed his recommendations for intrathecal chemotherapy + systemic chemotherapy vs shift to comfort and quality of life focus including initiation of hospice services.  Sesilia confirms her decision to forgo further chemotherapy and focus more on quality days in [place] with her sisters. She understands prognosis may be weeks-months. She shares that further chemotherapy would be hard for her. She is at peace with end-of-life.   Discussed hospice philosophy and options. Explained that she is not yet appropriate for residential hospice facility but eligible for hospice services in her sisters home vs nursing facility (sister is concerned that she is unable to provide 24/7 care). Reassured Orly and Claiborne Billings that I will contact social work and have them assist with options in [place], [place]. Claiborne Billings is also requesting possible transport via EMS to ensure her sister safely arrives to [place].  Advanced directives, concepts specific to code status, artifical feeding and hydration, and rehospitalization were considered and discussed. Gertha has completed HCPOA and living will documentation. This is copied into epic. Reviewed with patient and sister. Claiborne Billings is primary HCPOA and sister, Rise Paganini is secondary HCPOA. Madilynne clearly writes that she does not want life-prolonging interventions. Patient and sister understand that with metastatic breast cancer, heroic measures at EOL would not be effective and would cause pain/suffering at the end of  her life.   Introduced and completed MOST form with Set designer. Patient wishes including DNR/DNI, comfort focused care, determine use or limitation of antibiotics, IVF if indicated, and NO feeding tube. Durable DNR completed. Copies of MOST and durable DNR placed in chart and given to sister.   Reviewed medications and again hospice philosophy. Answered questions and concerns. PMT contact information given.    SUMMARY  OF RECOMMENDATIONS    Patient wishes to forgo further chemotherapy. She would rather focus on comfort, symptoms, and quality of life for her remaining days. Sister wishes to move her to [place], [place] for EOL care.   SW following to assist with disposition and initiation of hospice services in [place]. Patient is not yet eligible for residential hospice facility. Patient is eligible for home hospice services at her sister's house vs. SNF with hospice.  Patient has documented living will in epic. HCPOA's are documented.  MOST form completed with patient and sister. Patient wishes include: DNR/DNI, comfort focused care, determine use or limitation of antibiotics, IVF if indicated, and NO feeding tube. Durable DNR completed. Copies of DNR and MOST made for sister & chart.   Updated attending, RN, and SW.   Code Status/Advance Care Planning:  DNR  Symptom Management:   Per attending  Palliative Prophylaxis:   Bowel Regimen, Delirium Protocol, Frequent Pain Assessment and Oral Care  Psycho-social/Spiritual:   Desire for further Chaplaincy support:yes  Additional Recommendations: Caregiving  Support/Resources, Compassionate Wean Education and Education on Hospice  Prognosis:   Poor prognosis likely months with metastatic breast cancer & patient decision to forgo further chemotherapy and focus on comfort/quality of life and initiation of hospice services.   Discharge Planning: SW assisting with discharge to [place], [place] with sister & initiation of hospice services.       Primary Diagnoses: Present on Admission: . Breast cancer metastasized to brain ([place]) . Bone metastases ([place]) . Liver metastases ([place]) . Acute metabolic encephalopathy . Leukocytosis . Tobacco abuse   I have reviewed the medical record, interviewed the patient and family, and examined the patient. The following aspects are pertinent.  Past Medical History:  Diagnosis Date  . Cancer [facility])    Metastatic  Breast Cancer   Social History   Socioeconomic History  . Marital status: Single    Spouse name: Not on file  . Number of children: Not on file  . Years of education: Not on file  . Highest education level: Not on file  Occupational History  . Not on file  Social Needs  . Financial resource strain: Not on file  . Food insecurity    Worry: Not on file    Inability: Not on file  . Transportation needs    Medical: Not on file    Non-medical: Not on file  Tobacco Use  . Smoking status: Current Every Day Smoker    Packs/day: 0.15    Types: Cigarettes    Last attempt to quit: 01/21/2017    Years since quitting: 2.5  . Smokeless tobacco: Never Used  Substance and Sexual Activity  . Alcohol use: No    Alcohol/week: 0.0 standard drinks  . Drug use: No  . Sexual activity: Never  Lifestyle  . Physical activity    Days per week: Not on file    Minutes per session: Not on file  . Stress: Not on file  Relationships  . Social Herbalist on phone: Not on file    Gets together: Not  on file    Attends religious service: Not on file    Active member of club or organization: Not on file    Attends meetings of clubs or organizations: Not on file    Relationship status: Not on file  Other Topics Concern  . Not on file  Social History Narrative   Lives alone. 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 Has phobia of the medical system.   Family History  Problem Relation Age of Onset  . Cancer Mother        breast  . Cancer Sister        breast   Scheduled Meds: . Chlorhexidine Gluconate Cloth  6 each Topical Daily  . dexamethasone (DECADRON) injection  6 mg Intravenous Q6H  . enoxaparin (LOVENOX) injection  40 mg Subcutaneous Daily  . famotidine  20 mg Oral BID  . feeding supplement (ENSURE ENLIVE)  237 mL Oral BID BM  . insulin aspart  0-5 Units Subcutaneous QHS  . insulin aspart  0-9 Units Subcutaneous TID WC  . levETIRAcetam  1,000 mg Oral BID  . mouth rinse  15 mL Mouth  Rinse BID  . nicotine  21 mg Transdermal Daily  . sodium chloride flush  10-40 mL Intracatheter Q12H   Continuous Infusions: PRN Meds:.acetaminophen **OR** acetaminophen, LORazepam, ondansetron **OR** ondansetron (ZOFRAN) IV, sodium chloride flush Medications Prior to Admission:  Prior to Admission medications   Medication Sig Start Date End Date Taking? Authorizing Provider  aspirin EC 81 MG tablet Take 1 tablet (81 mg total) by mouth daily. 08/01/17  Yes [name], MD  dexamethasone (DECADRON) 1 MG tablet Take 1 tablet (1 mg total) by mouth daily with breakfast. 08/13/19  Yes Carmin Muskrat, MD  DULoxetine (CYMBALTA) 60 MG capsule Take 1 capsule (60 mg total) by mouth daily. 07/24/19  Yes [name], MD  feeding supplement, ENSURE ENLIVE, (ENSURE ENLIVE) LIQD Take 237 mLs by mouth 2 (two) times daily between meals. 07/22/18  Yes Sheikh, Omair Latif, DO  glipiZIDE (GLUCOTROL) 5 MG tablet Take 1 tablet (5 mg total) by mouth 2 (two) times daily before a meal. 07/20/19  Yes Shamleffer, Melanie Crazier, MD  JANUVIA 50 MG tablet TAKE 1 TABLET(50 MG) BY MOUTH DAILY Patient taking differently: Take 50 mg by mouth daily.  08/13/19  Yes [name], MD  levETIRAcetam (KEPPRA) 1000 MG tablet Take 1 tablet (1,000 mg total) by mouth 2 (two) times daily. 03/26/19  Yes Vaslow, Acey Lav, MD  ondansetron (ZOFRAN ODT) 4 MG disintegrating tablet Take 1 tablet (4 mg total) by mouth every 8 (eight) hours as needed for nausea or vomiting. 08/13/19  Yes Carmin Muskrat, MD  pantoprazole (PROTONIX) 40 MG tablet Take 40 mg by mouth daily. 08/01/19  Yes [provider]  potassium chloride (K-DUR) 10 MEQ tablet Take 2 tablets (20 mEq total) by mouth daily for 3 days. 08/17/19 08/20/19 Yes Couture, Cortni S, PA-C  Vitamin D, Ergocalciferol, (DRISDOL) 1.25 MG (50000 UT) CAPS capsule TAKE 1 CAPSULE BY MOUTH 3 TIMES WEEKLY Patient taking differently: Take 50,000 Units by mouth 3 (three) times a  week. TAKE 1 CAPSULE BY MOUTH 3 TIMES WEEKLY 02/05/19  Yes [name], MD  chlorhexidine (PERIDEX) 0.12 % solution Use as directed 15 mLs in the mouth or throat 2 (two) times daily as needed (mouth rinse after vomiting). Swish and spit Patient not taking: Reported on 07/20/2019 04/05/19   [name], MD   Allergies  Allergen Reactions  . Thorazine [  Chlorpromazine] Anaphylaxis  . Ribociclib Other (See Comments)    Exfoliative dermatitis   Review of Systems  Constitutional:       Falls  Neurological: Positive for seizures and weakness.    Physical Exam Vitals signs and nursing note reviewed.  Constitutional:      General: She is awake.  HENT:     Head: Normocephalic and atraumatic.  Pulmonary:     Effort: No tachypnea, accessory muscle usage or respiratory distress.  Skin:    General: Skin is warm and dry.  Neurological:     Mental Status: She is alert and oriented to person, place, and time.  Psychiatric:        Mood and Affect: Mood normal.        Speech: Speech normal.        Cognition and Memory: Cognition normal.    Vital Signs: BP 111/79 (BP Location: Right Arm)   Pulse 79   Temp 97.9 F (36.6 C) (Oral)   Resp 20   Wt 79.5 kg   SpO2 98%   BMI 29.17 kg/m  Pain Scale: 0-10   Pain Score: 0-No pain   SpO2: SpO2: 98 % O2 Device:SpO2: 98 % O2 Flow Rate: .   IO: Intake/output summary:   Intake/Output Summary (Last 24 hours) at 08/23/2019 1932 Last data filed at 08/23/2019 1455 Gross per 24 hour  Intake 1361 ml  Output -  Net 1361 ml    LBM: Last BM Date: 08/23/19 Baseline Weight: Weight: 79.4 kg Most recent weight: Weight: 79.5 kg     Palliative Assessment/Data: PPS 50%   Flowsheet Rows     Most Recent Value  Intake Tab  Referral Department  Hospitalist  Unit at Time of Referral  Med/Surg Unit  Palliative Care Primary Diagnosis  Cancer  Palliative Care Type  New Palliative care  Date first seen by Palliative Care  08/23/19   Clinical Assessment  Palliative Performance Scale Score  50%  Psychosocial & Spiritual Assessment  Palliative Care Outcomes  Patient/Family meeting held?  Yes  Who was at the meeting?  patient and sister  Palliative Care Outcomes  Clarified goals of care, Counseled regarding hospice, Provided end of life care assistance, Provided advance care planning, Provided psychosocial or spiritual support, Changed CPR status, Completed durable DNR, Transitioned to hospice      Time In: 1015 Time Out: 1130 Time Total: 75 Greater than 50%  of this time was spent counseling and coordinating care related to the above assessment and plan.  Signed by:  Ihor Dow, DNP, FNP-C Palliative Medicine Team  Phone: [phone] Fax: [phone]   Please contact Palliative Medicine Team phone at [phone] for questions and concerns.  For individual provider: See Shea Evans

## 2019-08-23 NOTE — Progress Notes (Signed)
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  Patient was at [facility] for chemotherapy and sent to ER because she was found altered and had difficulty finding words.  She was also noted to have right facial twitching and seizures in the emergency room.  Initially reported that her right arm was limp and nonmobile, however she had been moving her all extremities in the ER. In the emergency room, she was found with abnormal LFTs, on room air, blood pressure is stable.  Electrolytes were normal.  Afebrile.  COVID-19 test negative.  CT scan of the head shows extensive left greater than right frontal lobe edema extending into the left temporal lobe.  Was loaded with dexamethasone and Keppra and admitted to the hospital.  Assessment & Plan:   Principal Problem:   Acute metabolic encephalopathy Active Problems:   Bone metastases ([place])   Liver metastases ([place])   Breast cancer metastasized to brain ([place])   Seizures ([place])   Leukocytosis   Diabetes mellitus without complication (HCC)   Tobacco abuse   Cancer with leptomeningeal spread (HCC)  Seizure disorder/postictal confusion and acute metabolic encephalopathy due to widespread brain mets and cerebral edema: Clinically stabilizing now. MRI confirmed multifocal brain metastases and leptomeningeal disease.  Exhausted cancer treatments. Treated with IV Keppra, changed to oral. Remains on IV dexamethasone, will change to oral. Ativan IV for breakthrough seizures.  Breast cancer with metastasis/liver, lungs, brain and bone metastasis with abnormal LFTs: On chemotherapy.  Followed by oncology.   After detailed discussion, patient and  family made informed decision about going home with hospice.   Patient will be going to [place] to live with her sister and her last days of her life.   Home hospice arrangements and other arrangements will be made.    Type 2 diabetes on insulin: Anticipate worsening blood sugars with use of dexamethasone.  On additional sliding scale insulin to cover.  Advance activities.  Work with PT OT.  Discussed with patient sister.  She is trying to make arrangements for patient to go to [place].  Patient is not safe discharge today.  DVT prophylaxis: Lovenox subcu Code Status: Full code Family Communication: Sister Claiborne Billings. Disposition Plan: Unknown at this time.  Home with hospice when arrangements are made.   Consultants:   Oncology  Palliative  Procedures:   None  Antimicrobials:   None   Subjective: Patient seen and examined.  No overnight events.  No more seizures. Objective: Vitals:   08/22/19 1355 08/22/19 2010 08/23/19 0510 08/23/19 1454  BP: 93/73 125/82 123/80 111/79  Pulse: 99 96 63 79  Resp: 20 19 19 20   Temp: 97.8 F (36.6 C) 97.8 F (36.6 C) 97.6 F (36.4 C) 97.9 F (36.6 C)  TempSrc:  Oral Oral Oral  SpO2: 99% 100% 97% 98%  Weight:   79.5 kg     Intake/Output Summary (Last 24 hours) at 08/23/2019 1528 Last data filed at 08/23/2019 1455 Gross per 24 hour  Intake 1601 ml  Output -  Net 1601 ml   Filed Weights   08/22/19 0500 08/23/19 0510  Weight: 79.4 kg 79.5 kg    Examination:  General exam: Appears calm and comfortable, on  room air.  Chronically sick looking.  On room air. Respiratory system: Clear to auscultation. Respiratory effort normal. Cardiovascular system: S1 & S2 heard, RRR. No JVD, murmurs, rubs, gallops or clicks.  Left arm with chronic lymphedema. Gastrointestinal system: Abdomen is nondistended, soft and nontender. No organomegaly or masses felt. Normal bowel sounds heard. Central nervous system: Alert and oriented. No focal  neurological deficits.  She can move all extremities with no deficits. Extremities: Symmetric 5 x 5 power. Skin: No rashes, lesions or ulcers Psychiatry: Judgement and insight appear normal. Mood & affect anxious.    Data Reviewed: I have personally reviewed following labs and imaging studies  CBC: Recent Labs  Lab 08/17/19 1133 08/20/19 1126 08/21/19 0633  WBC 9.0 11.3* 7.6  NEUTROABS  --  9.4*  --   HGB 15.1* 15.3* 13.7  HCT 45.7 45.7 42.3  MCV 94.2 90.0 93.0  PLT 172 182 [id number]   Basic Metabolic Panel: Recent Labs  Lab 08/17/19 1133 08/20/19 1126 08/21/19 0633  NA 140 137 137  K 2.9* 4.0 3.8  CL 102 103 104  CO2 27 24 23   GLUCOSE 115* 90 128*  BUN 10 11 13   CREATININE 0.48 0.60 0.44  CALCIUM 9.0 8.9 7.8*   GFR: Estimated Creatinine Clearance: 75 mL/min (by C-G formula based on SCr of 0.44 mg/dL). Liver Function Tests: Recent Labs  Lab 08/17/19 1133 08/20/19 1126  AST 281* 294*  ALT 224* 232*  ALKPHOS 747* 875*  BILITOT 4.2* 3.8*  PROT 6.4* 6.4*  ALBUMIN 2.8* 2.6*   No results for input(s): LIPASE, AMYLASE in the last 168 hours. No results for input(s): AMMONIA in the last 168 hours. Coagulation Profile: No results for input(s): INR, PROTIME in the last 168 hours. Cardiac Enzymes: No results for input(s): CKTOTAL, CKMB, CKMBINDEX, TROPONINI in the last 168 hours. BNP (last 3 results) No results for input(s): PROBNP in the last 8760 hours. HbA1C: No results for input(s): HGBA1C in the last 72 hours. CBG: Recent Labs  Lab 08/22/19 1209 08/22/19 1743 08/22/19 2013 08/23/19 0754 08/23/19 1128  GLUCAP 222* 184* 195* 220* 253*   Lipid Profile: No results for input(s): CHOL, HDL, LDLCALC, TRIG, CHOLHDL, LDLDIRECT in the last 72 hours. Thyroid Function Tests: No results for input(s): TSH, T4TOTAL, FREET4, T3FREE, THYROIDAB in the last 72 hours. Anemia Panel: No results for input(s): VITAMINB12, FOLATE, FERRITIN, TIBC, IRON, RETICCTPCT in the last 72  hours. Sepsis Labs: Recent Labs  Lab 08/17/19 1149  LATICACIDVEN 2.1*    Recent Results (from the past 240 hour(s))  Urine culture     Status: None   Collection Time: 08/17/19  2:25 PM   Specimen: Urine, Random  Result Value Ref Range Status   Specimen Description   Final    URINE, RANDOM Performed at [facility] [address], [place], [place] [address]    Special Requests   Final    NONE Performed at [facility], [place] [address], [place], [place] [address]    Culture   Final    Multiple bacterial morphotypes present, none predominant. Suggest appropriate recollection if clinically indicated.   Report Status 08/19/2019 FINAL  Final  Urine Culture     Status: Abnormal   Collection Time: 08/20/19  3:42 PM   Specimen: Urine, Clean Catch  Result Value Ref Range Status   Specimen Description   Final    URINE, CLEAN CATCH Performed at [facility], [address] [place], [place]  [address]    Special Requests   Final    NONE Performed at [facility], [place] [address] [place], [place] [address]    Culture MULTIPLE SPECIES PRESENT, SUGGEST RECOLLECTION (A)  Final   Report Status 08/22/2019 FINAL  Final  SARS CORONAVIRUS 2 (TAT 6-24 HRS) Nasopharyngeal Nasopharyngeal Swab     Status: None   Collection Time: 08/20/19 10:59 PM   Specimen: Nasopharyngeal Swab  Result Value Ref Range Status   SARS Coronavirus 2 NEGATIVE NEGATIVE Final    Comment: (NOTE) SARS-CoV-2 target nucleic acids are NOT DETECTED. The SARS-CoV-2 RNA is generally detectable in upper and lower respiratory specimens during the acute phase of infection. Negative results do not preclude SARS-CoV-2 infection, do not rule out co-infections with other pathogens, and should not be used as the sole basis for treatment or other patient management decisions. Negative results must be combined with clinical observations, patient  history, and epidemiological information. The expected result is Negative. Fact Sheet for Patients: SugarRoll.be Fact Sheet for Healthcare Providers: https://www.woods-mathews.com/ This test is not yet approved or cleared by the [place] FDA and  has been authorized for detection and/or diagnosis of SARS-CoV-2 by FDA under an Emergency Use Authorization (EUA). This EUA will remain  in effect (meaning this test can be used) for the duration of the COVID-19 declaration under Section 56 4(b)(1) of the Act, 21 U.S.C. section 360bbb-3(b)(1), unless the authorization is terminated or revoked sooner. Performed at [facility], [place] [address], [place], [place] [address]          Radiology Studies: Mr Jeri Cos Wo Contrast  Result Date: 08/22/2019 CLINICAL DATA:  Metastatic breast cancer, evaluate for brain Mets. Additional history provided: Metastatic breast cancer to lungs, liver, brain and bone on chemotherapy. Patient was at [facility] for chemotherapy and send to ER due to being found altered and difficulty with words. EXAM: MRI HEAD WITHOUT AND WITH CONTRAST TECHNIQUE: Multiplanar, multiecho pulse sequences of the brain and surrounding structures were obtained without and with intravenous contrast. CONTRAST:  61mL GADAVIST GADOBUTROL 1 MMOL/ML IV SOLN COMPARISON:  Brain MRI 06/07/2011 FINDINGS: Brain: There is no convincing evidence of acute infarct. As compared to prior brain MRI 06/07/2019, irregular and nodular dural-based metastatic disease along the bilateral anterior frontal lobes has significantly increased, now measuring up to 15 mm in AP dimension (series 13, image 10). Malignant dural thickening overlying the anterior frontal lobes only measured a few mm at this site previously. Persistent irregular dural-based enhancement along the bilateral cerebral hemispheres. Abnormal leptomeningeal enhancement along the bilateral frontal lobe  convexities has significantly increased, and is consistent with leptomeningeal metastatic disease (for instance as seen on series 11, images 118-134). New nodular enhancing metastatic lesion within the superomedial right cerebellum measuring 0.8 x 0.7 cm (series 12, image 10). Again demonstrated is irregular nodular dural-based enhancement along the left greater wing of sphenoid. Portions of this enhancement have decreased in thickness since prior examination. However, irregular enhancement now extends more anteromedially, with nodular dural-based tumor now abutting the anterior clinoid process measuring up to 0.8 cm in thickness. Associated adjacent leptomeningeal enhancement within the anteroinferior and anterolateral left frontal lobe and anterior left temporal lobe, more conspicuous than on prior examination. Significant interval decrease in conspicuity of a previously demonstrated 2.2 x 2.6 cm enhancing lesion along the left lower dura overlying the transverse/sigmoid junction. This now appears as only subtle irregular dural thickening and enhancement (for instance as seen on series 12,  image 12). Extensive confluent white matter T2/FLAIR hyperintensity and swelling within the bilateral cerebral hemispheres, greatest within the bilateral frontal lobes, left parietal lobe and left temporal lobe, significantly increased from prior examination. There is associated partial effacement of the lateral ventricles, new as compared to prior examination. No midline shift. There are stable meningiomas in the posterior fossa, the larger adjacent to the jugular foramen measuring 13 mm in diameter, and the other measuring 6 mm along the right clivus. No chronic intracranial blood products. Pronounced cerebellar atrophy redemonstrated. Vascular: Flow voids maintained within the proximal large arterial vessels. Skull and upper cervical spine: Known sclerotic metastasis within the posterior C4 vertebral body. Sclerotic  metastases within the calvarium appears similar to prior exam. Sinuses/Orbits: Visualized orbits demonstrate no acute abnormality. Tiny mucous retention cyst within the inferior right frontal sinus. Small mucous retention cyst within the inferior right maxillary sinus. No significant mastoid effusion. Findings within the impression below were called by telephone at the time of interpretation on 08/22/2019 at 8:43 pm to provider NP Curt Bears schorr, who verbally acknowledged these results. IMPRESSION: 1. No evidence of acute infarct. 2. Significant interval increase in nodular dural-based metastatic disease along the anterior frontal lobes bilaterally. 3. Persistent extensive dural-based metastatic disease along the bilateral cerebral convexities. Significant interval increase in underlying bilateral frontal lobe leptomeningeal enhancement consistent with leptomeningeal metastatic disease. 4. New 0.8 cm nodular metastasis within the superomedial right cerebellum. 5. Some of the dural-based metastatic disease along the left greater wing of sphenoid has decreased in thickness, however, extends more anteriorly with new nodular tumor now abutting the anterior clinoid process measuring up to 0.8 cm in thickness. Adjacent leptomeningeal enhancement within the anteroinferior and anterolateral frontal lobe and anterior left temporal lobe has increased, consistent with leptomeningeal metastatic disease. 6. Significant interval decrease in extent of dural-based metastatic disease along the left transverse/sigmoid junction. 7. Significant interval increase in extensive white matter T2 hyperintensity with associated mass effect involving the bilateral frontal lobes, left parietal lobe and left temporal lobe. Partial effacement of the lateral ventricles is new from prior exam. Findings may reflect treatment related changes. Clinical correlation is recommended. Electronically Signed   By: Kellie Simmering   On: 08/22/2019 20:44         Scheduled Meds: . Chlorhexidine Gluconate Cloth  6 each Topical Daily  . dexamethasone (DECADRON) injection  6 mg Intravenous Q6H  . enoxaparin (LOVENOX) injection  40 mg Subcutaneous Daily  . famotidine  20 mg Oral BID  . feeding supplement (ENSURE ENLIVE)  237 mL Oral BID BM  . insulin aspart  0-5 Units Subcutaneous QHS  . insulin aspart  0-9 Units Subcutaneous TID WC  . levETIRAcetam  1,000 mg Oral BID  . mouth rinse  15 mL Mouth Rinse BID  . nicotine  21 mg Transdermal Daily  . sodium chloride flush  10-40 mL Intracatheter Q12H   Continuous Infusions:    LOS: 2 days    Time spent: 25 minutes    Barb Merino, MD [facility] Pager [phone]  If 7PM-7AM, please contact night-coverage [email or web address] Password [email or web address] 08/23/2019, 3:28 PM

## 2019-08-23 NOTE — Telephone Encounter (Signed)
Patients sister called to speak with Dr. Mickeal Skinner Neuro Oncologist to get his interreptation of the most recent MRI Brain.  She also had addition questions.   Dr. Mickeal Skinner to call Claiborne Billings and speak directly with her.

## 2019-08-23 NOTE — Evaluation (Signed)
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  Patient was at [facility] for chemotherapy and sent to ER because she was found altered and had difficulty finding words.  She was also noted to have right facial twitching and seizures in the emergency room.   Clinical Impression   This 63 y/o female presents with the above. PTA pt reports use of RW for functional mobility, reports sister assists with ADL tasks. Pt presenting with weakness, impaired cognition, decreased standing balance. She currently requires minA for seated UB ADL, at least modA for LB ADL and minA for functional transfers using RW. Pt with delayed processing requiring increased time to follow simple commands. Also noted pt with edemous LUE (pt reports lymphedema). Pt typically lives alone but reports plans to move in with sister. Will need to confirm accuracy and that pt will have 24hr supervision/assist at time of discharge. Recommend HH vs SNF pending availability of assist at home. Will continue to follow acutely to progress pt towards established acute OT goals.    Follow Up Recommendations  Home health OT;SNF;Supervision/Assistance - 24 hour(HH vs SNF)    Equipment Recommendations  3 in 1 bedside commode           Precautions / Restrictions Precautions Precautions: Fall Restrictions Weight Bearing Restrictions: No      Mobility Bed Mobility Overal bed mobility: Needs Assistance Bed Mobility: Supine to Sit     Supine to sit: Min assist     General bed mobility comments: assist for trunk elevation, cues to scoot hips forward  Transfers Overall transfer level: Needs  assistance Equipment used: Rolling walker (2 wheeled) Transfers: Sit to/from Stand Sit to Stand: Min assist         General transfer comment: cues for hand placement as pt opting to pull up on RW, assist to rise and stabilize. control descent    Balance Overall balance assessment: Needs assistance Sitting-balance support: No upper extremity supported;Feet supported Sitting balance-Leahy Scale: Fair     Standing balance support: Bilateral upper extremity supported Standing balance-Leahy Scale: Poor Standing balance comment: reliant on UEs for static balance                           ADL either performed or assessed with clinical judgement   ADL Overall ADL's : Needs assistance/impaired Eating/Feeding: Set up;Sitting   Grooming: Set up;Sitting Grooming Details (indicate cue type and reason): pt using her hand sanitizer end of session, required assist to take cap off of sanitizer, significant time/effort for pt to push nozzle for sanitizer Upper Body Bathing: Minimal assistance;Sitting   Lower Body Bathing: Moderate assistance;Sit to/from stand   Upper Body Dressing : Minimal assistance;Sitting   Lower Body Dressing: Moderate assistance;Sit to/from stand   Toilet Transfer: Minimal assistance;Stand-pivot;RW Armed forces technical officer Details (indicate cue type and reason): simulated via transfer to [place] and Hygiene: Moderate assistance;Sit to/from stand       Functional mobility during ADLs: Minimal assistance;Rolling walker General ADL Comments: assisted pt with order breakfast than OOB to recliner/setup for grooming ADL and to await breakfast tray     Vision         Perception  Praxis      Pertinent Vitals/Pain Pain Assessment: No/denies pain     Hand Dominance Right   Extremity/Trunk Assessment Upper Extremity Assessment Upper Extremity Assessment: Generalized weakness;RUE deficits/detail;LUE deficits/detail RUE  Deficits / Details: noted increased difficulty performing fine motor tasks with dominant RUE RUE Coordination: decreased fine motor LUE Deficits / Details: edemous LUE, pt reports has lymphedema LUE Coordination: decreased fine motor;decreased gross motor   Lower Extremity Assessment Lower Extremity Assessment: Defer to PT evaluation       Communication Communication Communication: No difficulties   Cognition Arousal/Alertness: Awake/alert Behavior During Therapy: WFL for tasks assessed/performed Overall Cognitive Status: Impaired/Different from baseline Area of Impairment: Problem solving;Following commands;Safety/judgement;Orientation;Memory;Awareness                 Orientation Level: Disoriented to;Place;Time(mildly disoriented, states [facility] hospital and Monday )   Memory: Decreased short-term memory Following Commands: Follows one step commands with increased time Safety/Judgement: Decreased awareness of safety;Decreased awareness of deficits Awareness: Emergent Problem Solving: Slow processing;Decreased initiation;Difficulty sequencing;Requires verbal cues;Requires tactile cues General Comments: delay noted with processing and confusion, would benefit from further cognitive assessment   General Comments       Exercises     Shoulder Instructions      Home Living Family/patient expects to be discharged to:: Private residence(sister's home) Living Arrangements: Other relatives(plans to move in with sister) Available Help at Discharge: Family;Available 24 hours/day Type of Home: House Home Access: Stairs to enter [employer] of Steps: 1   Home Layout: One level     Bathroom Shower/Tub: Teacher, early years/pre: Standard     Home Equipment: Environmental consultant - 2 wheels;Shower seat   Additional Comments: unsure of accuracy, home setup is sister's home as pt reports this is where she plans to move      Prior Functioning/Environment Level of  Independence: Needs assistance  Gait / Transfers Assistance Needed: reports using RW for mobility ADL's / Homemaking Assistance Needed: reports sister helped with ADL            OT Problem List: Decreased strength;Decreased range of motion;Decreased activity tolerance;Impaired balance (sitting and/or standing);Decreased cognition;Decreased coordination;Decreased safety awareness;Decreased knowledge of use of DME or AE;Impaired UE functional use;Increased edema      OT Treatment/Interventions: Self-care/ADL training;Therapeutic exercise;Energy conservation;DME and/or AE instruction;Therapeutic activities;Cognitive remediation/compensation;Patient/family education;Balance training    OT Goals(Current goals can be found in the care plan section) Acute Rehab OT Goals Patient Stated Goal: none stated, agreeable to working with therapy OT Goal Formulation: With patient Time For Goal Achievement: 09/06/19 Potential to Achieve Goals: Good  OT Frequency: Min 2X/week   Barriers to D/C:            Co-evaluation              AM-PAC OT "6 Clicks" Daily Activity     Outcome Measure Help from another person eating meals?: A Little Help from another person taking care of personal grooming?: A Little Help from another person toileting, which includes using toliet, bedpan, or urinal?: A Lot Help from another person bathing (including washing, rinsing, drying)?: A Lot Help from another person to put on and taking off regular upper body clothing?: A Little Help from another person to put on and taking off regular lower body clothing?: A Lot 6 Click Score: 15   End of Session Equipment Utilized During Treatment: Gait belt;Rolling walker Nurse Communication: Mobility status  Activity Tolerance: Patient tolerated treatment well Patient left: in chair;with call bell/phone within  reach;with chair alarm set  OT Visit Diagnosis: Other symptoms and signs involving cognitive function;Muscle  weakness (generalized) (M62.81);Unsteadiness on feet (R26.81)                Time: [id number] OT Time Calculation (min): 21 min Charges:  OT General Charges $OT Visit: 1 Visit OT Evaluation $OT Eval Moderate Complexity: [place], OT [employer] Pager [phone] Office [place] 08/23/2019, 10:07 AM

## 2019-08-23 NOTE — Progress Notes (Signed)
[place] CSW Progress Notes  Message from Erskine Emery - desk nurse for Dr Irene Limbo:  "Patient's sister Stacey Cox called Dr. Grier Mitts office this morning - she said patient is considering hospice  on discharge. She wants to take patient back to [facility] and has asked if referral can be made to hospice in her area. She has also asked about palliative care, so not sure of her understanding of difference? (She lives in [place].) Stacey Cox is also asking for information regarding either full time hospice facility or home health care as she does not think she can provide patient care "around the clock". Can one of you connect with inpatient case management so these can be addressed as part of discharge planning?"    Phone message from sister Stacey Cox requesting call from inpatient CSW/care manager and palliative care team to discuss discharge plans for patient.  Both requests conveyed to covering inpatient CSW Sharren Bridge.  Edwyna Shell, LCSW Clinical Social Worker Phone:  [phone] Cell:  [phone]

## 2019-08-23 NOTE — Evaluation (Signed)
SLP Cancellation Note  Patient Details Name: Stacey Cox MRN: [id number] DOB: Mar 29, 1956   Cancelled treatment:       Reason Eval/Treat Not Completed: Other (comment)(pt sound asleep - RN asked for SLP to return later, will continue efforts)   Macario Golds 08/23/2019, 12:45 PM   Luanna Salk, [place] [facility] SLP [place] Pager [phone] Office [phone]

## 2019-08-23 NOTE — Progress Notes (Signed)
Opened in error

## 2019-08-24 ENCOUNTER — Inpatient Hospital Stay: Payer: BC Managed Care – PPO | Admitting: Hematology

## 2019-08-24 ENCOUNTER — Telehealth: Payer: Self-pay | Admitting: *Deleted

## 2019-08-24 LAB — GLUCOSE, CAPILLARY
Glucose-Capillary: 163 mg/dL — ABNORMAL HIGH (ref 70–99)
Glucose-Capillary: 269 mg/dL — ABNORMAL HIGH (ref 70–99)

## 2019-08-24 MED ORDER — DEXAMETHASONE 4 MG PO TABS
6.0000 mg | ORAL_TABLET | Freq: Four times a day (QID) | ORAL | Status: DC
  Administered 2019-08-24 – 2019-08-27 (×7): 6 mg via ORAL
  Filled 2019-08-24 (×8): qty 2

## 2019-08-24 NOTE — Progress Notes (Signed)
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  Patient was at [facility] for chemotherapy and sent to ER because she was found altered and had difficulty finding words.  She was also noted to have right facial twitching and seizures in the emergency room.  Initially reported that her right arm was limp and nonmobile, however she had been moving her all extremities in the ER. In the emergency room, she was found with abnormal LFTs, on room air, blood pressure is stable.  Electrolytes were normal. Afebrile.  COVID-19 test negative.  CT scan of the head shows extensive left greater than right frontal lobe edema extending into the left temporal lobe.  Was loaded with dexamethasone and Keppra and admitted to the hospital.  Assessment & Plan:   Principal Problem:   Acute metabolic encephalopathy Active Problems:   Bone metastases (HCC)   Goals of care, counseling/discussion   Liver metastases ([place])   Breast cancer metastasized to brain ([place])   Seizures ([place])   Leukocytosis   Diabetes mellitus without complication ([place])   Tobacco abuse   Cancer with leptomeningeal spread (HCC)  Seizure disorder/postictal confusion and acute metabolic encephalopathy due to widespread brain mets and cerebral edema. Breast cancer with metastasis/liver, lungs, brain and bone metastasis with abnormal LFTs:    Plan:  Patient continues to be debilitated, now with anorexia.  She has stopped eating.  No more seizures. Plan was to arrange to go to [facility] with patient sister with involvement of hospice at home. Patient is lethargic, unable to interact or walk.  She is not safe to travel a distance. Discussed  with patient's sister, Ms. Claiborne Billings at bedside. Patient will be better served with local hospice home facilities. All comfort care medications available. Do not escalate care, no labs, no IV fluids or antibiotics. We will continue oral Keppra as she can able to take it, will continue oral steroids. IV Ativan for comfort and breakthrough seizures.   DVT prophylaxis: Lovenox subcu Code Status: DNR  Family Communication: Sister Claiborne Billings at bedside.  Disposition Plan: Hospice when available.   Consultants:   Oncology  Palliative  Procedures:   None  Antimicrobials:   None   Subjective: Patient seen and examined.  Patient was seen repeatedly with sister present.  She remains sleepy and lethargic, does not want to eat and does not want to look at the food. Objective: Vitals:   08/23/19 1454 08/23/19 2050 08/24/19 0506 08/24/19 0508  BP: 111/79 118/86 115/78 115/78  Pulse: 79 84 94 88  Resp: 20 17 17 17   Temp: 97.9 F (36.6 C) 98 F (36.7 C) (!) 97.5 F (36.4 C) (!) 97.5 F (36.4 C)  TempSrc: Oral     SpO2: 98% 100% 96% 97%  Weight:        Intake/Output Summary (Last 24 hours) at 08/24/2019 1432 Last data filed at 08/24/2019 0801 Gross per 24 hour  Intake 120 ml  Output 550 ml  Net -430 ml   Filed Weights   08/22/19 0500 08/23/19 0510  Weight: 79.4 kg 79.5 kg    Examination:  General exam: Appears calm and lethargic.  On room air.  Lethargic and very less interactive today.  respiratory system: Clear to auscultation. Respiratory effort normal. Cardiovascular system: S1 & S2 heard, RRR. No JVD, murmurs, rubs, gallops or clicks.  Left arm with chronic lymphedema. Gastrointestinal system: Abdomen is nondistended, soft and nontender. No organomegaly or masses felt. Normal bowel sounds heard. Central nervous system: Sleepy and lethargic.  No focal neurological deficits.  She can move all extremities with no deficits. Extremities: Symmetric 5 x 5 power. Skin: No  rashes, lesions or ulcers Psychiatry: Judgement and insight appear normal. Mood & affect anxious.    Data Reviewed: I have personally reviewed following labs and imaging studies  CBC: Recent Labs  Lab 08/20/19 1126 08/21/19 0633  WBC 11.3* 7.6  NEUTROABS 9.4*  --   HGB 15.3* 13.7  HCT 45.7 42.3  MCV 90.0 93.0  PLT 182 [id number]   Basic Metabolic Panel: Recent Labs  Lab 08/20/19 1126 08/21/19 0633  NA 137 137  K 4.0 3.8  CL 103 104  CO2 24 23  GLUCOSE 90 128*  BUN 11 13  CREATININE 0.60 0.44  CALCIUM 8.9 7.8*   GFR: Estimated Creatinine Clearance: 75 mL/min (by C-G formula based on SCr of 0.44 mg/dL). Liver Function Tests: Recent Labs  Lab 08/20/19 1126  AST 294*  ALT 232*  ALKPHOS 875*  BILITOT 3.8*  PROT 6.4*  ALBUMIN 2.6*   No results for input(s): LIPASE, AMYLASE in the last 168 hours. No results for input(s): AMMONIA in the last 168 hours. Coagulation Profile: No results for input(s): INR, PROTIME in the last 168 hours. Cardiac Enzymes: No results for input(s): CKTOTAL, CKMB, CKMBINDEX, TROPONINI in the last 168 hours. BNP (last 3 results) No results for input(s): PROBNP in the last 8760 hours. HbA1C: No results for input(s): HGBA1C in the last 72 hours. CBG: Recent Labs  Lab 08/23/19 0754 08/23/19 1128 08/23/19 1743 08/23/19 2051 08/24/19 0729  GLUCAP 220* 253* 185* 170* 163*   Lipid Profile: No results for input(s): CHOL, HDL, LDLCALC, TRIG, CHOLHDL, LDLDIRECT in the last 72 hours. Thyroid Function Tests: No results for input(s): TSH, T4TOTAL, FREET4, T3FREE, THYROIDAB in the last 72 hours. Anemia Panel: No results for input(s): VITAMINB12, FOLATE, FERRITIN, TIBC, IRON, RETICCTPCT in the last 72 hours. Sepsis Labs: No results for input(s): PROCALCITON, LATICACIDVEN in the last 168 hours.  Recent Results (from the past 240 hour(s))  Urine culture     Status: None   Collection Time: 08/17/19  2:25 PM   Specimen: Urine, Random  Result Value  Ref Range Status   Specimen Description   Final    URINE, RANDOM Performed at [facility] [address] [place], [place] [address]    Special Requests   Final    NONE Performed at [facility], [place] [address], [place], [place] [address]    Culture   Final    Multiple bacterial morphotypes present, none predominant. Suggest appropriate recollection if clinically indicated.   Report Status 08/19/2019 FINAL  Final  Urine Culture     Status: Abnormal   Collection Time: 08/20/19  3:42 PM   Specimen: Urine, Clean Catch  Result Value Ref Range Status   Specimen Description   Final    URINE, CLEAN CATCH Performed at [facility], [place] [address], [place], [place] [address]    Special Requests   Final    NONE Performed at [facility], [place] [address], [place], [place] [address]    Culture MULTIPLE SPECIES PRESENT, SUGGEST RECOLLECTION (A)  Final   Report Status 08/22/2019 FINAL  Final  SARS CORONAVIRUS 2 (TAT 6-24 HRS) Nasopharyngeal Nasopharyngeal Swab     Status: None   Collection Time: 08/20/19 10:59 PM   Specimen: Nasopharyngeal Swab  Result Value Ref Range Status   SARS Coronavirus 2 NEGATIVE NEGATIVE Final    Comment: (NOTE) SARS-CoV-2 target nucleic acids are NOT DETECTED. The SARS-CoV-2 RNA is generally detectable in upper and lower respiratory specimens during the acute phase of infection. Negative results do not preclude SARS-CoV-2 infection, do not rule out co-infections with other pathogens, and should not be used as the sole basis for treatment or other patient management decisions. Negative results must be combined with clinical observations, patient history, and epidemiological information. The expected result is Negative. Fact Sheet for Patients: SugarRoll.be Fact Sheet for Healthcare Providers: https://www.woods-mathews.com/ This test  is not yet approved or cleared by the [place] FDA and  has been authorized for detection and/or diagnosis of SARS-CoV-2 by FDA under an Emergency Use Authorization (EUA). This EUA will remain  in effect (meaning this test can be used) for the duration of the COVID-19 declaration under Section 56 4(b)(1) of the Act, 21 U.S.C. section 360bbb-3(b)(1), unless the authorization is terminated or revoked sooner. Performed at [facility], [place] [address] [place], [place] [address]          Radiology Studies: Mr Jeri Cos Wo Contrast  Result Date: 08/22/2019 CLINICAL DATA:  Metastatic breast cancer, evaluate for brain Mets. Additional history provided: Metastatic breast cancer to lungs, liver, brain and bone on chemotherapy. Patient was at [facility] for chemotherapy and send to ER due to being found altered and difficulty with words. EXAM: MRI HEAD WITHOUT AND WITH CONTRAST TECHNIQUE: Multiplanar, multiecho pulse sequences of the brain and surrounding structures were obtained without and with intravenous contrast. CONTRAST:  17mL GADAVIST GADOBUTROL 1 MMOL/ML IV SOLN COMPARISON:  Brain MRI 06/07/2011 FINDINGS: Brain: There is no convincing evidence of acute infarct. As compared to prior brain MRI 06/07/2019, irregular and nodular dural-based metastatic disease along the bilateral anterior frontal lobes has significantly increased, now measuring up to 15 mm in AP dimension (series 13, image 10). Malignant dural thickening overlying the anterior frontal lobes only measured a few mm at this site previously. Persistent irregular dural-based enhancement along the bilateral cerebral hemispheres. Abnormal leptomeningeal enhancement along the bilateral frontal lobe convexities has significantly increased, and is consistent with leptomeningeal metastatic disease (for instance as seen on series 11, images 118-134). New nodular enhancing metastatic lesion within the superomedial right cerebellum  measuring 0.8 x 0.7 cm (series 12, image 10). Again demonstrated is irregular nodular dural-based enhancement along the left greater wing of sphenoid. Portions of this enhancement have decreased in thickness since prior examination. However, irregular enhancement now extends more anteromedially, with nodular dural-based tumor now abutting the anterior clinoid process measuring up to 0.8 cm in thickness. Associated adjacent leptomeningeal enhancement within the anteroinferior and anterolateral left frontal lobe and anterior left temporal lobe, more conspicuous than on prior examination. Significant interval decrease in conspicuity of a previously demonstrated 2.2 x 2.6 cm enhancing lesion along the left lower dura overlying the transverse/sigmoid junction. This now appears as only subtle irregular dural thickening and enhancement (for instance as seen on series 12, image 12). Extensive confluent white matter T2/FLAIR hyperintensity and swelling within the bilateral cerebral hemispheres, greatest within the bilateral frontal lobes, left parietal lobe and left temporal lobe, significantly increased from prior examination. There is associated partial effacement  of the lateral ventricles, new as compared to prior examination. No midline shift. There are stable meningiomas in the posterior fossa, the larger adjacent to the jugular foramen measuring 13 mm in diameter, and the other measuring 6 mm along the right clivus. No chronic intracranial blood products. Pronounced cerebellar atrophy redemonstrated. Vascular: Flow voids maintained within the proximal large arterial vessels. Skull and upper cervical spine: Known sclerotic metastasis within the posterior C4 vertebral body. Sclerotic metastases within the calvarium appears similar to prior exam. Sinuses/Orbits: Visualized orbits demonstrate no acute abnormality. Tiny mucous retention cyst within the inferior right frontal sinus. Small mucous retention cyst within the  inferior right maxillary sinus. No significant mastoid effusion. Findings within the impression below were called by telephone at the time of interpretation on 08/22/2019 at 8:43 pm to provider NP Curt Bears schorr, who verbally acknowledged these results. IMPRESSION: 1. No evidence of acute infarct. 2. Significant interval increase in nodular dural-based metastatic disease along the anterior frontal lobes bilaterally. 3. Persistent extensive dural-based metastatic disease along the bilateral cerebral convexities. Significant interval increase in underlying bilateral frontal lobe leptomeningeal enhancement consistent with leptomeningeal metastatic disease. 4. New 0.8 cm nodular metastasis within the superomedial right cerebellum. 5. Some of the dural-based metastatic disease along the left greater wing of sphenoid has decreased in thickness, however, extends more anteriorly with new nodular tumor now abutting the anterior clinoid process measuring up to 0.8 cm in thickness. Adjacent leptomeningeal enhancement within the anteroinferior and anterolateral frontal lobe and anterior left temporal lobe has increased, consistent with leptomeningeal metastatic disease. 6. Significant interval decrease in extent of dural-based metastatic disease along the left transverse/sigmoid junction. 7. Significant interval increase in extensive white matter T2 hyperintensity with associated mass effect involving the bilateral frontal lobes, left parietal lobe and left temporal lobe. Partial effacement of the lateral ventricles is new from prior exam. Findings may reflect treatment related changes. Clinical correlation is recommended. Electronically Signed   By: Kellie Simmering   On: 08/22/2019 20:44        Scheduled Meds: . Chlorhexidine Gluconate Cloth  6 each Topical Daily  . dexamethasone  6 mg Oral Q6H  . enoxaparin (LOVENOX) injection  40 mg Subcutaneous Daily  . famotidine  20 mg Oral BID  . feeding supplement (ENSURE  ENLIVE)  237 mL Oral BID BM  . insulin aspart  0-5 Units Subcutaneous QHS  . insulin aspart  0-9 Units Subcutaneous TID WC  . levETIRAcetam  1,000 mg Oral BID  . mouth rinse  15 mL Mouth Rinse BID  . nicotine  21 mg Transdermal Daily  . sodium chloride flush  10-40 mL Intracatheter Q12H   Continuous Infusions:    LOS: 3 days    Time spent: 25 minutes    Barb Merino, MD [facility] Pager [phone]  If 7PM-7AM, please contact night-coverage [email or web address] Password [email or web address] 08/24/2019, 2:32 PM

## 2019-08-24 NOTE — TOC Progression Note (Signed)
Transition of Care [facility]) - Progression Note    Patient Details  Name: Stacey Cox MRN: [id number] Date of Birth: 16-Feb-1956  Transition of Care [facility]) CM/SW [place], [place] Phone Number: [phone] 08/24/2019, 2:43 PM  Clinical Narrative:   Have been researching and referring pt for hospice services in [place]. Appears residential hospice is not offered in [place] area, was able to locate home hospice services and [facility] hospice, however had not been able to find agency able to accept pt's commercial BCBS.  This afternoon, physician advises pt declining, has not been eating, and very lethargic, and after discussing with family, now seeking local hospice residential care as it would not be advisable to plan for long transport.   Discussed agency options with pt's sister and referred to [facility]. Will follow to assist.    Expected Discharge Plan: Hospice facility    Expected Discharge Plan and Services Expected Discharge Plan: Home w Hospice Care(versus hospice medical facility) In-house Referral: Clinical Social Work, Hospice / [place] Acute Care Choice: (hospice care) Living arrangements for the past 2 months: Single Family Home Expected Discharge Date: (unknown)                                     Social Determinants of Health (SDOH) Interventions    Readmission Risk Interventions No flowsheet data found.

## 2019-08-24 NOTE — Progress Notes (Signed)
PT Cancellation Note  Patient Details Name: Winefred Dieleman MRN: [id number] DOB: [birth year]   Cancelled Treatment:      attempted in am for any OOB activity walking/toileting.  Pt politely declined and requested to rest.  Pt plans to D/C to Residential Hospice.   Rica Koyanagi  PTA Acute  Rehabilitation Services Pager      [phone] Office      [phone]

## 2019-08-24 NOTE — Telephone Encounter (Signed)
Faxed MD letter to [employer] [phone] to support authorization for use of Flexitouch pneumatic compression device to treat lymphedema. Fax confirmation received.

## 2019-08-24 NOTE — Evaluation (Signed)
SLP Cancellation Note  Patient Details Name: Braeleigh Bronn MRN: [id number] DOB: 1956-05-11   Cancelled treatment:       Reason Eval/Treat Not Completed: Other (comment);Patient declined, no reason specified(pt denies dysphagia, note she is to dc with hospice)   Macario Golds 08/24/2019, 1:59 PM   Luanna Salk, [facility] SLP Acute Rehab Services Pager [phone] Office [phone]

## 2019-08-24 NOTE — Progress Notes (Signed)
OT Cancellation Note  Patient Details Name: Stacey Cox MRN: [id number] DOB: 10-21-1956   Cancelled Treatment:    Reason Eval/Treat Not Completed: Other (comment).  Plan is now residential hospice. Will sign off.  [name] 08/24/2019, 3:38 PM  Lesle Chris, OTR/L Acute Rehabilitation Services [phone] [facility] pager [phone] office 08/24/2019

## 2019-08-24 NOTE — Progress Notes (Signed)
[facility]) Hospital Liaison RN Note:  Received request from Sharren Bridge, [place] for family interest in [facility]. Chart reviewed and spoke with sister Claiborne Billings to acknowledge referral, answer questions and give [facility] contact information.   Unfortunately [facility] is not able to offer a room today. Family and CSW are aware. [facility] liaison will follow up with CSW and family tomorrow or sooner if room becomes available.   Please do not hesitate to call with questions.  Thank you.   Gar Ponto, RN [facility] Liaison  [place] are on AMION

## 2019-08-25 LAB — GLUCOSE, CAPILLARY
Glucose-Capillary: 235 mg/dL — ABNORMAL HIGH (ref 70–99)
Glucose-Capillary: 268 mg/dL — ABNORMAL HIGH (ref 70–99)

## 2019-08-25 MED ORDER — DEXAMETHASONE 6 MG PO TABS: 6.0000 mg | ORAL_TABLET | Freq: Four times a day (QID) | ORAL | Status: AC

## 2019-08-25 NOTE — TOC Progression Note (Signed)
Transition of Care [facility]) - Progression Note    Patient Details  Name: Stacey Cox MRN: [id number] Date of Birth: [birth year]  Transition of Care [facility]) CM/SW Contact  [address], [place], [place] Phone Number: 08/25/2019, 12:56 PM  Clinical Narrative:    Follow up phone call to [facility] to check status on bed availability. Spoke with Harmon Pier who confirmed that patient is eligible for residential hospice, however there are no beds available today.  This social worker will continue to follow up with patient's referral to residential hospice.   Elliot Gurney, [place] Clinical Social Worker [phone]   Expected Discharge Plan: Home w Hospice Care(versus hospice medical facility)    Expected Discharge Plan and Services Expected Discharge Plan: Home w Hospice Care(versus hospice medical facility) In-house Referral: Clinical Social Work, Hospice / [place] Acute Care Choice: (hospice care) Living arrangements for the past 2 months: Single Family Home Expected Discharge Date: 08/25/19                                     Social Determinants of Health (SDOH) Interventions    Readmission Risk Interventions No flowsheet data found.

## 2019-08-25 NOTE — Progress Notes (Addendum)
   08/24/19 2121  MEWS Assessment  Is this an acute change? Yes  MEWS guidelines implemented *See [place]  Provider Notification  Provider Name/Title NP [place]  Date Provider Notified 08/24/19  Time Provider Notified 2122  Notification Type Page  Notification Reason Change in status  Response Other (Comment)   No new orders received.

## 2019-08-25 NOTE — Plan of Care (Signed)
  Problem: Education: ?Goal: Knowledge of General Education information will improve ?Description: Including pain rating scale, medication(s)/side effects and non-pharmacologic comfort measures ?Outcome: Progressing ?  ?Problem: Health Behavior/Discharge Planning: ?Goal: Ability to manage health-related needs will improve ?Outcome: Progressing ?  ?Problem: Clinical Measurements: ?Goal: Will remain free from infection ?Outcome: Progressing ?  ?Problem: Clinical Measurements: ?Goal: Diagnostic test results will improve ?Outcome: Progressing ?  ?Problem: Clinical Measurements: ?Goal: Respiratory complications will improve ?Outcome: Progressing ?  ?Problem: Clinical Measurements: ?Goal: Cardiovascular complication will be avoided ?Outcome: Progressing ?  ?

## 2019-08-25 NOTE — Progress Notes (Signed)
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  Patient was at [place] for chemotherapy and sent to ER because she was found altered and had difficulty finding words.  She was also noted to have right facial twitching and seizures in the emergency room.  Initially reported that her right arm was limp and nonmobile, however she had been moving her all extremities in the ER. In the emergency room, she was found with abnormal LFTs, on room air, blood pressure is stable.  Electrolytes were normal. Afebrile.  COVID-19 test negative.  CT scan of the head shows extensive left greater than right frontal lobe edema extending into the left temporal lobe.  Was loaded with dexamethasone and Keppra and admitted to the hospital.  Assessment & Plan:   Principal Problem:   Acute metabolic encephalopathy Active Problems:   Bone metastases (HCC)   Goals of care, counseling/discussion   Liver metastases ([place])   Breast cancer metastasized to brain ([place])   Seizures ([place])   Leukocytosis   Diabetes mellitus without complication ([place])   Tobacco abuse   Cancer with leptomeningeal spread (HCC)  Seizure disorder/postictal confusion and acute metabolic encephalopathy due to widespread brain mets and cerebral edema. Breast cancer with metastasis/liver, lungs, brain and bone metastasis with abnormal LFTs:    Plan:  Patient continues to be debilitated, now with anorexia.  She has stopped eating.  No more seizures. Plan was to arrange to go to [facility] with patient sister with involvement of hospice at home. Patient is lethargic, unable to interact or walk.  She is not safe to travel a distance. Patient  will be better served with local hospice home facilities. All comfort care medications available. Do not escalate care, no labs, no IV fluids or antibiotics. We will continue oral Keppra as she can able to take it, will continue oral steroids. IV Ativan for comfort and breakthrough seizures. Waiting for hospice bed availability.   DVT prophylaxis: Lovenox subcu Code Status: DNR  Family Communication: Sister Claiborne Billings.  Disposition Plan: Hospice when available.   Consultants:   Oncology  Palliative  Procedures:   None  Antimicrobials:   None   Subjective: Patient seen and examined.  Not much interactive.  Mostly sleepy.  Does not want to eat and has not eaten much since yesterday.  Objective: Vitals:   08/25/19 0434 08/25/19 0500 08/25/19 0508 08/25/19 0926  BP: (!) 129/103  105/75 109/81  Pulse: (!) 116  92 78  Resp: 18  18 14   Temp: 98.5 F (36.9 C)   98.1 F (36.7 C)  TempSrc: Oral   Oral  SpO2: 97%  95% 97%  Weight:  79.6 kg      Intake/Output Summary (Last 24 hours) at 08/25/2019 1217 Last data filed at 08/25/2019 [id number] Gross per 24 hour  Intake 130 ml  Output -  Net 130 ml   Filed Weights   08/22/19 0500 08/23/19 0510 08/25/19 0500  Weight: 79.4 kg 79.5 kg 79.6 kg    Examination:  General exam: Appears calm and lethargic.  On room air.  Sleepy and very less interactive today.   respiratory system: Clear to auscultation. Respiratory effort normal. Cardiovascular system: S1 & S2 heard,  RRR. No JVD, murmurs, rubs, gallops or clicks.  Left arm with chronic lymphedema. Gastrointestinal system: Abdomen is nondistended, soft and nontender. No organomegaly or masses felt. Normal bowel sounds heard. Central nervous system: Sleepy and lethargic.  No focal neurological deficits.  She can move all extremities with no deficits. Extremities: Symmetric 5 x 5 power. Skin: No rashes, lesions or ulcers Psychiatry: Judgement and insight appear normal. Mood & affect  anxious and flat.    Data Reviewed: I have personally reviewed following labs and imaging studies  CBC: Recent Labs  Lab 08/20/19 1126 08/21/19 0633  WBC 11.3* 7.6  NEUTROABS 9.4*  --   HGB 15.3* 13.7  HCT 45.7 42.3  MCV 90.0 93.0  PLT 182 [id number]   Basic Metabolic Panel: Recent Labs  Lab 08/20/19 1126 08/21/19 0633  NA 137 137  K 4.0 3.8  CL 103 104  CO2 24 23  GLUCOSE 90 128*  BUN 11 13  CREATININE 0.60 0.44  CALCIUM 8.9 7.8*   GFR: Estimated Creatinine Clearance: 75 mL/min (by C-G formula based on SCr of 0.44 mg/dL). Liver Function Tests: Recent Labs  Lab 08/20/19 1126  AST 294*  ALT 232*  ALKPHOS 875*  BILITOT 3.8*  PROT 6.4*  ALBUMIN 2.6*   No results for input(s): LIPASE, AMYLASE in the last 168 hours. No results for input(s): AMMONIA in the last 168 hours. Coagulation Profile: No results for input(s): INR, PROTIME in the last 168 hours. Cardiac Enzymes: No results for input(s): CKTOTAL, CKMB, CKMBINDEX, TROPONINI in the last 168 hours. BNP (last 3 results) No results for input(s): PROBNP in the last 8760 hours. HbA1C: No results for input(s): HGBA1C in the last 72 hours. CBG: Recent Labs  Lab 08/23/19 1743 08/23/19 2051 08/24/19 0729 08/24/19 2049 08/25/19 1128  GLUCAP 185* 170* 163* 269* 235*   Lipid Profile: No results for input(s): CHOL, HDL, LDLCALC, TRIG, CHOLHDL, LDLDIRECT in the last 72 hours. Thyroid Function Tests: No results for input(s): TSH, T4TOTAL, FREET4, T3FREE, THYROIDAB in the last 72 hours. Anemia Panel: No results for input(s): VITAMINB12, FOLATE, FERRITIN, TIBC, IRON, RETICCTPCT in the last 72 hours. Sepsis Labs: No results for input(s): PROCALCITON, LATICACIDVEN in the last 168 hours.  Recent Results (from the past 240 hour(s))  Urine culture     Status: None   Collection Time: 08/17/19  2:25 PM   Specimen: Urine, Random  Result Value Ref Range Status   Specimen Description   Final    URINE, RANDOM Performed at  [facility] [address] [place], [place] [address]    Special Requests   Final    NONE Performed at [facility], [place] [address] [place], [place] [address]    Culture   Final    Multiple bacterial morphotypes present, none predominant. Suggest appropriate recollection if clinically indicated.   Report Status 08/19/2019 FINAL  Final  Urine Culture     Status: Abnormal   Collection Time: 08/20/19  3:42 PM   Specimen: Urine, Clean Catch  Result Value Ref Range Status   Specimen Description   Final    URINE, CLEAN CATCH Performed at [facility], [place] [address] [place], [place] [address]    Special Requests   Final    NONE Performed at [facility], [place] [address], [place], [place] [address]    Culture MULTIPLE SPECIES PRESENT, SUGGEST RECOLLECTION (A)  Final   Report Status 08/22/2019 FINAL  Final  SARS  CORONAVIRUS 2 (TAT 6-24 HRS) Nasopharyngeal Nasopharyngeal Swab     Status: None   Collection Time: 08/20/19 10:59 PM   Specimen: Nasopharyngeal Swab  Result Value Ref Range Status   SARS Coronavirus 2 NEGATIVE NEGATIVE Final    Comment: (NOTE) SARS-CoV-2 target nucleic acids are NOT DETECTED. The SARS-CoV-2 RNA is generally detectable in upper and lower respiratory specimens during the acute phase of infection. Negative results do not preclude SARS-CoV-2 infection, do not rule out co-infections with other pathogens, and should not be used as the sole basis for treatment or other patient management decisions. Negative results must be combined with clinical observations, patient history, and epidemiological information. The expected result is Negative. Fact Sheet for Patients: SugarRoll.be Fact Sheet for Healthcare Providers: https://www.woods-mathews.com/ This test is not yet approved or cleared by the [place] FDA and  has been authorized  for detection and/or diagnosis of SARS-CoV-2 by FDA under an Emergency Use Authorization (EUA). This EUA will remain  in effect (meaning this test can be used) for the duration of the COVID-19 declaration under Section 56 4(b)(1) of the Act, 21 U.S.C. section 360bbb-3(b)(1), unless the authorization is terminated or revoked sooner. Performed at [facility], [place] [address] [place], [place] [address]          Radiology Studies: No results found.      Scheduled Meds: . Chlorhexidine Gluconate Cloth  6 each Topical Daily  . dexamethasone  6 mg Oral Q6H  . enoxaparin (LOVENOX) injection  40 mg Subcutaneous Daily  . famotidine  20 mg Oral BID  . feeding supplement (ENSURE ENLIVE)  237 mL Oral BID BM  . insulin aspart  0-5 Units Subcutaneous QHS  . insulin aspart  0-9 Units Subcutaneous TID WC  . levETIRAcetam  1,000 mg Oral BID  . mouth rinse  15 mL Mouth Rinse BID  . nicotine  21 mg Transdermal Daily  . sodium chloride flush  10-40 mL Intracatheter Q12H   Continuous Infusions:    LOS: 4 days    Time spent: 25 minutes    Barb Merino, MD [facility] Pager [phone]  If 7PM-7AM, please contact night-coverage [email or web address] Password [email or web address] 08/25/2019, 12:17 PM

## 2019-08-26 LAB — GLUCOSE, CAPILLARY
Glucose-Capillary: 247 mg/dL — ABNORMAL HIGH (ref 70–99)
Glucose-Capillary: 158 mg/dL — ABNORMAL HIGH (ref 70–99)

## 2019-08-26 MED ORDER — MORPHINE SULFATE (CONCENTRATE) 10 MG/0.5ML PO SOLN: 10.0000 mg | ORAL | Status: DC | PRN

## 2019-08-26 MED ORDER — LEVETIRACETAM IN NACL 1000 MG/100ML IV SOLN
1000.0000 mg | Freq: Two times a day (BID) | INTRAVENOUS | Status: DC
  Administered 2019-08-26 – 2019-08-27 (×2): 1000 mg via INTRAVENOUS
  Filled 2019-08-26 (×3): qty 100

## 2019-08-26 NOTE — Progress Notes (Signed)
08/26/19  0818  Notified MD that patient is requesting that we stop finger sticks. Waiting for response/order.

## 2019-08-26 NOTE — Progress Notes (Signed)
[facility] room available for patient tomorrow, Monday, October 5th. CSW Bernette aware. Paper work completed with sister/HCPOA [employer] via [employer]. Dr. Orpah Melter to assume care.  Please send discharge summary to [facility] at [phone].  RN please call report to [facility] prior to patient leaving the unit Monday at [phone].  Thank you,  Erling Conte, LCSW [phone]

## 2019-08-26 NOTE — Plan of Care (Signed)
  Problem: Safety: Goal: Ability to remain free from injury will improve Outcome: Progressing   Problem: Skin Integrity: Goal: Risk for impaired skin integrity will decrease Outcome: Progressing   Problem: Nutrition: Goal: Adequate nutrition will be maintained Outcome: Progressing

## 2019-08-26 NOTE — Progress Notes (Signed)
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  Patient was at [place] for chemotherapy and sent to ER because she was found altered and had difficulty finding words.  She was also noted to have right facial twitching and seizures in the emergency room.  Initially reported that her right arm was limp and nonmobile, however she had been moving her all extremities in the ER. In the emergency room, she was found with abnormal LFTs, on room air, blood pressure is stable.  Electrolytes were normal. Afebrile.  COVID-19 test negative.  CT scan of the head shows extensive left greater than right frontal lobe edema extending into the left temporal lobe.  Was loaded with dexamethasone and Keppra and admitted to the hospital.  Assessment & Plan:   Principal Problem:   Acute metabolic encephalopathy Active Problems:   Bone metastases (HCC)   Goals of care, counseling/discussion   Liver metastases ([place])   Breast cancer metastasized to brain ([place])   Seizures ([place])   Leukocytosis   Diabetes mellitus without complication ([place])   Tobacco abuse   Cancer with leptomeningeal spread (HCC)  Seizure disorder/postictal confusion and acute metabolic encephalopathy due to widespread brain mets and cerebral edema. Breast cancer with metastasis/liver, lungs, brain and bone metastasis with abnormal LFTs:    Plan:  Patient with profound debility, anorexia and now stopped eating.  No more seizures. Initial plan was to travel to [place] with sister and involve hospice.  Patient is too sick to travel long distances. Full comfort care.  Discontinue fingersticks.  No labs.  No escalation care. Discontinue  fingersticks. IV Ativan for comfort and breakthrough seizures.  Morphine for comfort. Waiting for hospice bed availability. Anticipate tomorrow.   DVT prophylaxis: Lovenox subcu Code Status: DNR  Family Communication: Sister Claiborne Billings.  Disposition Plan: Hospice when available.   Consultants:   Oncology  Palliative  Procedures:   None  Antimicrobials:   None   Subjective: Patient seen and examined.  Not in any distress.  Lethargic and sleepy. Objective: Vitals:   08/25/19 1248 08/25/19 1732 08/25/19 2013 08/26/19 0526  BP: 102/73 (!) 133/98 117/89 100/72  Pulse: (!) 110 (!) 103 88 98  Resp: 16 14 16 16   Temp: 98 F (36.7 C) (!) 97.4 F (36.3 C) 97.8 F (36.6 C) 97.6 F (36.4 C)  TempSrc: Oral  Oral Oral  SpO2: 93% 97% 99% 96%  Weight:       No intake or output data in the 24 hours ending 08/26/19 1439 Filed Weights   08/22/19 0500 08/23/19 0510 08/25/19 0500  Weight: 79.4 kg 79.5 kg 79.6 kg    Examination:  General exam: Appears calm and lethargic.  On room air.  Sleepy.    respiratory system: Clear to auscultation. Respiratory effort normal. Cardiovascular system: S1 & S2 heard, RRR. No JVD, murmurs, rubs, gallops or clicks.  Left arm with chronic lymphedema. Gastrointestinal system: Abdomen is nondistended, soft and nontender. No organomegaly or masses felt. Normal bowel sounds heard. Central nervous system: Sleepy and lethargic.  No focal neurological deficits.  She can move all extremities with no deficits. Extremities: Symmetric 5 x 5 power. Skin: No rashes, lesions or ulcers Psychiatry: Judgement and insight  appear normal. Mood & affect flat.    Data Reviewed: I have personally reviewed following labs and imaging studies  CBC: Recent Labs  Lab 08/20/19 1126 08/21/19 0633  WBC 11.3* 7.6  NEUTROABS 9.4*  --   HGB 15.3* 13.7  HCT 45.7 42.3  MCV 90.0 93.0  PLT 182 Q000111Q   Basic Metabolic Panel: Recent Labs  Lab 08/20/19 1126 08/21/19 0633   NA 137 137  K 4.0 3.8  CL 103 104  CO2 24 23  GLUCOSE 90 128*  BUN 11 13  CREATININE 0.60 0.44  CALCIUM 8.9 7.8*   GFR: Estimated Creatinine Clearance: 75 mL/min (by C-G formula based on SCr of 0.44 mg/dL). Liver Function Tests: Recent Labs  Lab 08/20/19 1126  AST 294*  ALT 232*  ALKPHOS 875*  BILITOT 3.8*  PROT 6.4*  ALBUMIN 2.6*   No results for input(s): LIPASE, AMYLASE in the last 168 hours. No results for input(s): AMMONIA in the last 168 hours. Coagulation Profile: No results for input(s): INR, PROTIME in the last 168 hours. Cardiac Enzymes: No results for input(s): CKTOTAL, CKMB, CKMBINDEX, TROPONINI in the last 168 hours. BNP (last 3 results) No results for input(s): PROBNP in the last 8760 hours. HbA1C: No results for input(s): HGBA1C in the last 72 hours. CBG: Recent Labs  Lab 08/24/19 2049 08/25/19 0751 08/25/19 1128 08/25/19 1620 08/26/19 0733  GLUCAP 269* 247* 235* 268* 158*   Lipid Profile: No results for input(s): CHOL, HDL, LDLCALC, TRIG, CHOLHDL, LDLDIRECT in the last 72 hours. Thyroid Function Tests: No results for input(s): TSH, T4TOTAL, FREET4, T3FREE, THYROIDAB in the last 72 hours. Anemia Panel: No results for input(s): VITAMINB12, FOLATE, FERRITIN, TIBC, IRON, RETICCTPCT in the last 72 hours. Sepsis Labs: No results for input(s): PROCALCITON, LATICACIDVEN in the last 168 hours.  Recent Results (from the past 240 hour(s))  Urine culture     Status: None   Collection Time: 08/17/19  2:25 PM   Specimen: Urine, Random  Result Value Ref Range Status   Specimen Description   Final    URINE, RANDOM Performed at [facility] [address] [place], [place] [address]    Special Requests   Final    NONE Performed at [facility], [place] [address], [place], [place] [address]    Culture   Final    Multiple bacterial morphotypes present, none predominant. Suggest appropriate recollection if clinically  indicated.   Report Status 08/19/2019 FINAL  Final  Urine Culture     Status: Abnormal   Collection Time: 08/20/19  3:42 PM   Specimen: Urine, Clean Catch  Result Value Ref Range Status   Specimen Description   Final    URINE, CLEAN CATCH Performed at [facility], [place] [address] [place], [place] [address]    Special Requests   Final    NONE Performed at [facility], [place] [address] [place],  [address]    Culture MULTIPLE SPECIES PRESENT, SUGGEST RECOLLECTION (A)  Final   Report Status 08/22/2019 FINAL  Final  SARS CORONAVIRUS 2 (TAT 6-24 HRS) Nasopharyngeal Nasopharyngeal Swab     Status: None   Collection Time: 08/20/19 10:59 PM   Specimen: Nasopharyngeal Swab  Result Value Ref Range Status   SARS Coronavirus 2 NEGATIVE NEGATIVE Final    Comment: (NOTE) SARS-CoV-2 target nucleic acids are NOT DETECTED. The SARS-CoV-2 RNA is generally detectable in upper and lower respiratory specimens during the acute phase of  infection. Negative results do not preclude SARS-CoV-2 infection, do not rule out co-infections with other pathogens, and should not be used as the sole basis for treatment or other patient management decisions. Negative results must be combined with clinical observations, patient history, and epidemiological information. The expected result is Negative. Fact Sheet for Patients: SugarRoll.be Fact Sheet for Healthcare Providers: https://www.woods-mathews.com/ This test is not yet approved or cleared by the [place] FDA and  has been authorized for detection and/or diagnosis of SARS-CoV-2 by FDA under an Emergency Use Authorization (EUA). This EUA will remain  in effect (meaning this test can be used) for the duration of the COVID-19 declaration under Section 56 4(b)(1) of the Act, 21 U.S.C. section 360bbb-3(b)(1), unless the authorization is terminated or revoked  sooner. Performed at [facility], [place] [address], [place], [place] [address]          Radiology Studies: No results found.      Scheduled Meds: . Chlorhexidine Gluconate Cloth  6 each Topical Daily  . dexamethasone  6 mg Oral Q6H  . famotidine  20 mg Oral BID  . feeding supplement (ENSURE ENLIVE)  237 mL Oral BID BM  . levETIRAcetam  1,000 mg Oral BID  . mouth rinse  15 mL Mouth Rinse BID  . nicotine  21 mg Transdermal Daily  . sodium chloride flush  10-40 mL Intracatheter Q12H   Continuous Infusions:    LOS: 5 days    Time spent: 25 minutes    Barb Merino, MD [facility] Pager [phone]  If 7PM-7AM, please contact night-coverage [email or web address] Password [email or web address] 08/26/2019, 2:39 PM

## 2019-08-27 ENCOUNTER — Telehealth: Payer: Self-pay | Admitting: *Deleted

## 2019-08-27 MED ORDER — HEPARIN SOD (PORK) LOCK FLUSH 100 UNIT/ML IV SOLN
500.0000 [IU] | INTRAVENOUS | Status: AC | PRN
  Administered 2019-08-27: 500 [IU]

## 2019-08-27 NOTE — TOC Transition Note (Signed)
Transition of Care [facility]) - CM/SW Discharge Note   Patient Details  Name: Stacey Cox MRN: [id number] Date of Birth: 19-Dec-1955  Transition of Care [facility] CM/SW Contact:  Nila Nephew, LCSW Phone Number: [phone] 08/27/2019, 10:15 AM   Clinical Narrative:   Pt admitting to [facility] for hospice care today- report [phone].  Will provide DC information to facility and arrange PTAR transportation.    Final next level of care: [place] Barriers to Discharge: No Barriers Identified   Patient Goals and CMS Choice Patient states their goals for this hospitalization and ongoing recovery are:: comfort care CMS Medicare.gov Compare Post Acute Care list provided to:: Other (Comment Required)(sister)    Discharge Placement                Patient to be transferred to facility by: [place] Name of family member notified: sister Claiborne Billings Patient and family notified of of transfer: 08/27/19  Discharge Plan and Services In-house Referral: Clinical Social Work, Hospice / [place] Acute Care Choice: (hospice care)                               Social Determinants of Health (SDOH) Interventions     Readmission Risk Interventions No flowsheet data found.

## 2019-08-27 NOTE — Discharge Summary (Signed)
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  Patient was found with Seizure disorder/postictal confusion and acute metabolic encephalopathy due to widespread brain mets and cerebral edema. Breast cancer with metastasis/liver, lungs, brain and bone metastasis with abnormal LFTs:    Plan:  Patient with profound debility, anorexia and now stopped eating.  No more seizures. Initial plan was to travel to [place] with sister and involve hospice.  Patient is too sick to travel long distances. Hospice care desired for end-of-life. Transferring to inpatient hospice today. All comfort care medications are available. Patient has a stopped eating mostly and taking medications, however will continue Keppra and steroid as much as she can take. Patient should have IV Ativan for seizure.  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[facility])    Discharge Instructions  Discharge Instructions    Diet general   Complete by: As directed       Allergies as of 08/27/2019      Reactions   Thorazine [chlorpromazine] Anaphylaxis   Ribociclib Other (See Comments)   Exfoliative dermatitis      Medication List    STOP taking these medications   aspirin EC 81 MG tablet   chlorhexidine 0.12 % solution Commonly known as: PERIDEX   glipiZIDE 5 MG tablet Commonly known as: GLUCOTROL   potassium chloride 10 MEQ tablet Commonly known as: KLOR-CON   Vitamin D (Ergocalciferol) 1.25 MG (50000 UT) Caps capsule Commonly known as: DRISDOL     TAKE these medications   dexamethasone 6 MG tablet Commonly known as: DECADRON Take 1 tablet (6 mg total) by mouth every 6 (six) hours. What changed:   medication strength  how much to take  when to take this   DULoxetine 60 MG capsule Commonly known as: CYMBALTA Take 1 capsule (60 mg total) by mouth daily.   feeding supplement (ENSURE ENLIVE) Liqd Take 237 mLs by mouth 2 (two) times daily between meals.   Januvia 50 MG tablet Generic drug: sitaGLIPtin TAKE 1 TABLET(50 MG) BY MOUTH DAILY What changed: See the new instructions.   levETIRAcetam 1000 MG tablet Commonly known as: Keppra Take 1 tablet (1,000 mg total) by mouth 2 (two) times daily.   ondansetron 4 MG disintegrating tablet Commonly known as: Zofran ODT Take 1 tablet (4 mg total) by mouth every 8 (eight) hours as needed for nausea or vomiting.   pantoprazole 40 MG tablet Commonly known as: PROTONIX Take 40 mg by mouth daily.       Allergies  Allergen Reactions  . Thorazine [Chlorpromazine] Anaphylaxis  . Ribociclib Other (See Comments)    Exfoliative dermatitis    Consultations:  Oncology  Hospice  Procedures/Studies: Ct Head W Or Wo Contrast  Result Date: 08/20/2019 CLINICAL DATA:  Altered mental status. Metastatic breast cancer. EXAM: CT HEAD WITHOUT AND WITH CONTRAST TECHNIQUE: Contiguous axial images were obtained from the base of the skull through the vertex without and with intravenous  contrast CONTRAST:  79mL OMNIPAQUE IOHEXOL 300 MG/ML  SOLN COMPARISON:  Head MRI 06/07/2019 FINDINGS: Brain: There is extensive, confluent hypoattenuation throughout the white matter of the left frontal lobe extending into the left temporal lobe, markedly increased from the prior MRI and consistent with worsening edema. New moderately extensive edema is present in the right frontal lobe. There is associated sulcal effacement in these regions without midline shift. There is abnormal enhancement over the left greater than right cerebral convexities which is both nodular and curvilinear and corresponds to known dural metastatic disease. This is better demonstrated by MRI than CT, however there is concern for interval progression with leptomeningeal involvement a possibility given more notable sulcal enhancement today compared to the prior MRI. No acute cortically based infarct is identified. The lateral and third ventricles are partially compressed due to edema. The basilar cisterns are patent. Marked bilateral inferior cerebellar atrophy is again noted. An 11 mm enhancing extra-axial mass in the posterior fossa posterior to the right jugular foramen is unchanged and compatible with a meningioma. A presumed partially calcified meningioma in the right prepontine cistern is unchanged as well. No acute intracranial hemorrhage or extra-axial fluid collection is identified. Vascular: No suspicious noncontrast vascular hyperdensity. Grossly patent major dural venous sinuses. Skull: No destructive skull lesion. Sinuses/Orbits: Visualized paranasal sinuses and mastoid air cells are clear. Orbits are unremarkable. Other: None. IMPRESSION: 1. Extensive left greater than right frontal lobe edema extending into the left temporal lobe, greatly progressed from the prior MRI. Regional mass effect without midline shift. 2. Enhancement over the left greater than right cerebral convexities corresponding to known dural metastases.  Contrast-enhanced brain MRI is recommended to evaluate for suspected progression and for the possibility of leptomeningeal involvement. Electronically Signed   By: Logan Bores M.D.   On: 08/20/2019 21:47   Mr Jeri Cos [id number] Contrast  Result Date: 08/22/2019 CLINICAL DATA:  Metastatic breast cancer, evaluate for brain Mets. Additional history provided: Metastatic breast cancer to lungs, liver, brain and bone on chemotherapy. Patient was at [facility] for chemotherapy and send to ER due to being found altered and difficulty with words. EXAM: MRI HEAD WITHOUT AND WITH CONTRAST TECHNIQUE: Multiplanar, multiecho pulse sequences of the brain and surrounding structures were obtained without and with intravenous contrast. CONTRAST:  59mL GADAVIST GADOBUTROL 1 MMOL/ML IV SOLN COMPARISON:  Brain MRI 06/07/2011 FINDINGS: Brain: There is no convincing evidence of acute infarct. As compared to prior brain MRI 06/07/2019, irregular and nodular dural-based metastatic disease along the bilateral anterior frontal lobes has significantly increased, now measuring up to 15 mm in AP dimension (series 13, image 10). Malignant dural thickening overlying the anterior frontal lobes only measured a few mm at this site previously. Persistent irregular dural-based enhancement along the bilateral cerebral hemispheres. Abnormal leptomeningeal enhancement along the bilateral frontal lobe convexities has significantly increased, and is consistent with leptomeningeal metastatic disease (for instance as seen on series 11, images 118-134). New nodular enhancing metastatic lesion within the superomedial right cerebellum measuring 0.8 x 0.7 cm (series 12, image 10). Again demonstrated is irregular nodular dural-based enhancement along the left greater wing of sphenoid. Portions of this enhancement have decreased in thickness since prior examination. However, irregular enhancement now extends  more anteromedially, with nodular dural-based tumor now  abutting the anterior clinoid process measuring up to 0.8 cm in thickness. Associated adjacent leptomeningeal enhancement within the anteroinferior and anterolateral left frontal lobe and anterior left temporal lobe, more conspicuous than on prior examination. Significant interval decrease in conspicuity of a previously demonstrated 2.2 x 2.6 cm enhancing lesion along the left lower dura overlying the transverse/sigmoid junction. This now appears as only subtle irregular dural thickening and enhancement (for instance as seen on series 12, image 12). Extensive confluent white matter T2/FLAIR hyperintensity and swelling within the bilateral cerebral hemispheres, greatest within the bilateral frontal lobes, left parietal lobe and left temporal lobe, significantly increased from prior examination. There is associated partial effacement of the lateral ventricles, new as compared to prior examination. No midline shift. There are stable meningiomas in the posterior fossa, the larger adjacent to the jugular foramen measuring 13 mm in diameter, and the other measuring 6 mm along the right clivus. No chronic intracranial blood products. Pronounced cerebellar atrophy redemonstrated. Vascular: Flow voids maintained within the proximal large arterial vessels. Skull and upper cervical spine: Known sclerotic metastasis within the posterior C4 vertebral body. Sclerotic metastases within the calvarium appears similar to prior exam. Sinuses/Orbits: Visualized orbits demonstrate no acute abnormality. Tiny mucous retention cyst within the inferior right frontal sinus. Small mucous retention cyst within the inferior right maxillary sinus. No significant mastoid effusion. Findings within the impression below were called by telephone at the time of interpretation on 08/22/2019 at 8:43 pm to provider NP Curt Bears schorr, who verbally acknowledged these results. IMPRESSION: 1. No evidence of acute infarct. 2. Significant interval increase in  nodular dural-based metastatic disease along the anterior frontal lobes bilaterally. 3. Persistent extensive dural-based metastatic disease along the bilateral cerebral convexities. Significant interval increase in underlying bilateral frontal lobe leptomeningeal enhancement consistent with leptomeningeal metastatic disease. 4. New 0.8 cm nodular metastasis within the superomedial right cerebellum. 5. Some of the dural-based metastatic disease along the left greater wing of sphenoid has decreased in thickness, however, extends more anteriorly with new nodular tumor now abutting the anterior clinoid process measuring up to 0.8 cm in thickness. Adjacent leptomeningeal enhancement within the anteroinferior and anterolateral frontal lobe and anterior left temporal lobe has increased, consistent with leptomeningeal metastatic disease. 6. Significant interval decrease in extent of dural-based metastatic disease along the left transverse/sigmoid junction. 7. Significant interval increase in extensive white matter T2 hyperintensity with associated mass effect involving the bilateral frontal lobes, left parietal lobe and left temporal lobe. Partial effacement of the lateral ventricles is new from prior exam. Findings may reflect treatment related changes. Clinical correlation is recommended. Electronically Signed   By: Kellie Simmering   On: 08/22/2019 20:44       Subjective: Patient seen and examined.  Lethargic.  Just says yes to everything.  No other overnight events.  Does not feel like eating.   Discharge Exam: Vitals:   08/26/19 1511 08/27/19 0525  BP: 106/79 123/86  Pulse: (!) 104 (!) 110  Resp: 16 19  Temp: 98.3 F (36.8 C) 97.8 F (36.6 C)  SpO2: 96% 100%   Vitals:   08/25/19 2013 08/26/19 0526 08/26/19 1511 08/27/19 0525  BP:  100/72 106/79 123/86  Pulse:  98 (!) 104 (!) 110  Resp: 16 16 16 19   Temp:  97.6 F (36.4 C) 98.3 F (36.8 C) 97.8 F (36.6 C)  TempSrc: Oral Oral Oral Oral  SpO2:   96% 96% 100%  Weight:  General: Pt is alert, awake, lethargic, not in acute distress, sick looking Cardiovascular: RRR, S1/S2 +, no rubs, no gallops Respiratory: CTA bilaterally, no wheezing, no rhonchi Abdominal: Soft, NT, ND, bowel sounds + Extremities: no edema, no cyanosis    The results of significant diagnostics from this hospitalization (including imaging, microbiology, ancillary and laboratory) are listed below for reference.     Microbiology: Recent Results (from the past 240 hour(s))  Urine culture     Status: None   Collection Time: 08/17/19  2:25 PM   Specimen: Urine, Random  Result Value Ref Range Status   Specimen Description   Final    URINE, RANDOM Performed at [facility] [address], [place], [place] [address]    Special Requests   Final    NONE Performed at [facility], [place] [address] [place], [place] [address]    Culture   Final    Multiple bacterial morphotypes present, none predominant. Suggest appropriate recollection if clinically indicated.   Report Status 08/19/2019 FINAL  Final  Urine Culture     Status: Abnormal   Collection Time: 08/20/19  3:42 PM   Specimen: Urine, Clean Catch  Result Value Ref Range Status   Specimen Description   Final    URINE, CLEAN CATCH Performed at [facility], [place] [address] [place], [place] [address]    Special Requests   Final    NONE Performed at [facility], [place] [address] [place], [place] [address]    Culture MULTIPLE SPECIES PRESENT, SUGGEST RECOLLECTION (A)  Final   Report Status 08/22/2019 FINAL  Final  SARS CORONAVIRUS 2 (TAT 6-24 HRS) Nasopharyngeal Nasopharyngeal Swab     Status: None   Collection Time: 08/20/19 10:59 PM   Specimen: Nasopharyngeal Swab  Result Value Ref Range Status   SARS Coronavirus 2 NEGATIVE NEGATIVE Final    Comment: (NOTE) SARS-CoV-2 target nucleic acids are NOT  DETECTED. The SARS-CoV-2 RNA is generally detectable in upper and lower respiratory specimens during the acute phase of infection. Negative results do not preclude SARS-CoV-2 infection, do not rule out co-infections with other pathogens, and should not be used as the sole basis for treatment or other patient management decisions. Negative results must be combined with clinical observations, patient history, and epidemiological information. The expected result is Negative. Fact Sheet for Patients: SugarRoll.be Fact Sheet for Healthcare Providers: https://www.woods-mathews.com/ This test is not yet approved or cleared by the [place] FDA and  has been authorized for detection and/or diagnosis of SARS-CoV-2 by FDA under an Emergency Use Authorization (EUA). This EUA will remain  in effect (meaning this test can be used) for the duration of the COVID-19 declaration under Section 56 4(b)(1) of the Act, 21 U.S.C. section 360bbb-3(b)(1), unless the authorization is terminated or revoked sooner. Performed at [facility], [place] [address] [place],  [address]      Labs: BNP (last 3 results) No results for input(s): BNP in the last 8760 hours. Basic Metabolic Panel: Recent Labs  Lab 08/20/19 1126 08/21/19 0633  NA 137 137  K 4.0 3.8  CL 103 104  CO2 24 23  GLUCOSE 90 128*  BUN 11 13  CREATININE 0.60 0.44  CALCIUM 8.9 7.8*   Liver Function Tests: Recent Labs  Lab 08/20/19 1126  AST 294*  ALT 232*  ALKPHOS 875*  BILITOT 3.8*  PROT 6.4*  ALBUMIN 2.6*   No results for input(s): LIPASE, AMYLASE  in the last 168 hours. No results for input(s): AMMONIA in the last 168 hours. CBC: Recent Labs  Lab 08/20/19 1126 08/21/19 0633  WBC 11.3* 7.6  NEUTROABS 9.4*  --   HGB 15.3* 13.7  HCT 45.7 42.3  MCV 90.0 93.0  PLT 182 159   Cardiac Enzymes: No results for input(s): CKTOTAL, CKMB, CKMBINDEX, TROPONINI in the last  168 hours. BNP: Invalid input(s): POCBNP CBG: Recent Labs  Lab 08/24/19 2049 08/25/19 0751 08/25/19 1128 08/25/19 1620 08/26/19 0733  GLUCAP 269* 247* 235* 268* 158*   D-Dimer No results for input(s): DDIMER in the last 72 hours. Hgb A1c No results for input(s): HGBA1C in the last 72 hours. Lipid Profile No results for input(s): CHOL, HDL, LDLCALC, TRIG, CHOLHDL, LDLDIRECT in the last 72 hours. Thyroid function studies No results for input(s): TSH, T4TOTAL, T3FREE, THYROIDAB in the last 72 hours.  Invalid input(s): FREET3 Anemia work up No results for input(s): VITAMINB12, FOLATE, FERRITIN, TIBC, IRON, RETICCTPCT in the last 72 hours. Urinalysis    Component Value Date/Time   COLORURINE AMBER (A) 08/20/2019 1543   APPEARANCEUR HAZY (A) 08/20/2019 1543   LABSPEC 1.015 08/20/2019 1543   PHURINE 6.0 08/20/2019 1543   GLUCOSEU NEGATIVE 08/20/2019 1543   HGBUR NEGATIVE 08/20/2019 1543   BILIRUBINUR SMALL (A) 08/20/2019 1543   KETONESUR NEGATIVE 08/20/2019 1543   PROTEINUR NEGATIVE 08/20/2019 1543   NITRITE NEGATIVE 08/20/2019 1543   LEUKOCYTESUR NEGATIVE 08/20/2019 1543   Sepsis Labs Invalid input(s): PROCALCITONIN,  WBC,  LACTICIDVEN Microbiology Recent Results (from the past 240 hour(s))  Urine culture     Status: None   Collection Time: 08/17/19  2:25 PM   Specimen: Urine, Random  Result Value Ref Range Status   Specimen Description   Final    URINE, RANDOM Performed at [facility], [place] [address], [place], [place] [address]    Special Requests   Final    NONE Performed at [facility], [place] [address] [place], [place] [address]    Culture   Final    Multiple bacterial morphotypes present, none predominant. Suggest appropriate recollection if clinically indicated.   Report Status 08/19/2019 FINAL  Final  Urine Culture     Status: Abnormal   Collection Time: 08/20/19  3:42 PM   Specimen: Urine, Clean Catch  Result Value  Ref Range Status   Specimen Description   Final    URINE, CLEAN CATCH Performed at [facility], [place] [address], [place], [place] [address]    Special Requests   Final    NONE Performed at [facility], [place] [address], [place], [place] [address]    Culture MULTIPLE SPECIES PRESENT, SUGGEST RECOLLECTION (A)  Final   Report Status 08/22/2019 FINAL  Final  SARS CORONAVIRUS 2 (TAT 6-24 HRS) Nasopharyngeal Nasopharyngeal Swab     Status: None   Collection Time: 08/20/19 10:59 PM   Specimen: Nasopharyngeal Swab  Result Value Ref Range Status   SARS Coronavirus 2 NEGATIVE NEGATIVE Final    Comment: (NOTE) SARS-CoV-2 target nucleic acids are NOT DETECTED. The SARS-CoV-2 RNA is generally detectable in upper and lower respiratory specimens during the acute phase of infection. Negative results do not preclude SARS-CoV-2 infection, do not rule out co-infections with other pathogens, and should not be used as the sole basis for treatment or other patient management decisions. Negative results must be combined with clinical observations, patient history, and epidemiological information. The expected result is  Negative. Fact Sheet for Patients: SugarRoll.be Fact Sheet for Healthcare Providers: https://www.woods-mathews.com/ This test is not yet approved or cleared by the [place] FDA and  has been authorized for detection and/or diagnosis of SARS-CoV-2 by FDA under an Emergency Use Authorization (EUA). This EUA will remain  in effect (meaning this test can be used) for the duration of the COVID-19 declaration under Section 56 4(b)(1) of the Act, 21 U.S.C. section 360bbb-3(b)(1), unless the authorization is terminated or revoked sooner. Performed at [facility], [place] [address] [place], [place] [address]      Time coordinating discharge:  40 minutes  SIGNED:   Barb Merino,  MD  [facility] 08/27/2019, 10:18 AM

## 2019-08-27 NOTE — Telephone Encounter (Signed)
Patient's sister called. Patient is being discharged today from [facility] to [facility] for Hospice care. Sister Claiborne Billings called to thank office and staff for care and assistance provided to patient.

## 2019-08-28 ENCOUNTER — Other Ambulatory Visit: Payer: Self-pay | Admitting: Hematology

## 2019-08-28 ENCOUNTER — Other Ambulatory Visit: Payer: Self-pay | Admitting: Internal Medicine

## 2019-08-31 ENCOUNTER — Ambulatory Visit: Payer: BC Managed Care – PPO | Admitting: Internal Medicine

## 2019-09-04 ENCOUNTER — Other Ambulatory Visit: Payer: Self-pay | Admitting: Internal Medicine

## 2019-09-04 ENCOUNTER — Telehealth: Payer: Self-pay | Admitting: *Deleted

## 2019-09-04 NOTE — Telephone Encounter (Signed)
Spoke with [employer] and confirmed patient deceased as of 2019/09/27

## 2019-09-10 ENCOUNTER — Other Ambulatory Visit: Payer: Self-pay | Admitting: Hematology

## 2019-09-14 ENCOUNTER — Ambulatory Visit: Payer: BC Managed Care – PPO

## 2019-09-23 DEATH — deceased

## 2019-10-02 ENCOUNTER — Ambulatory Visit: Payer: BC Managed Care – PPO | Admitting: Internal Medicine

## 2019-10-15 ENCOUNTER — Ambulatory Visit: Payer: BC Managed Care – PPO

## 2019-11-14 ENCOUNTER — Ambulatory Visit: Payer: BC Managed Care – PPO

## 2019-11-14 IMAGING — CT CT ABD-PELV W/ CM
2 of 5 series · 14 of 46 positions shown, 16 images · IV contrast (omnipaque)
Comparison: CT 02/22/2018

CLINICAL DATA: Metastatic breast carcinoma. Recent episodes of
vomiting. Some weight loss.

EXAM:
CT CHEST, ABDOMEN, AND PELVIS WITH CONTRAST
TECHNIQUE: Multidetector CT imaging of the chest, abdomen and pelvis was
performed following the standard protocol during bolus
administration of intravenous contrast.
CONTRAST:  100mL OMNIPAQUE IOHEXOL 300 MG/ML  SOLN

[Series 2: cap with · axial · 0.74mm/px · z∈[-581,-96]mm · 11 of 117 slices shown, 13 images]
[im 10/117  soft-tissue]
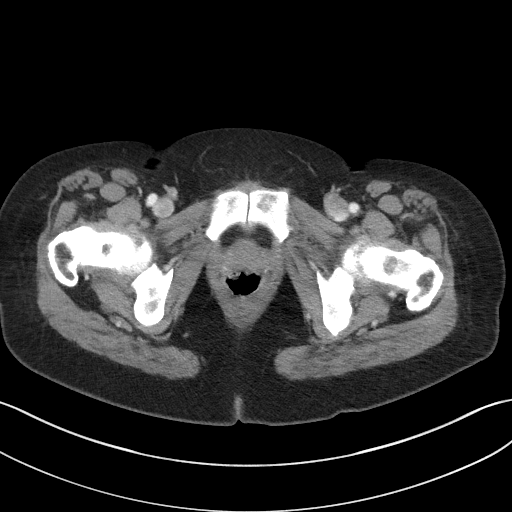
[im 10/117  bone]
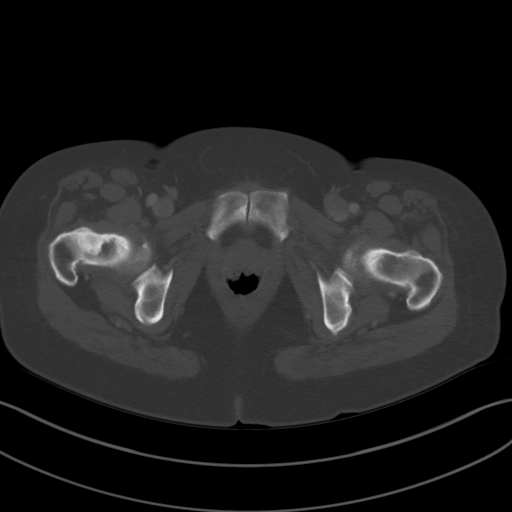
[im 20/117  soft-tissue]
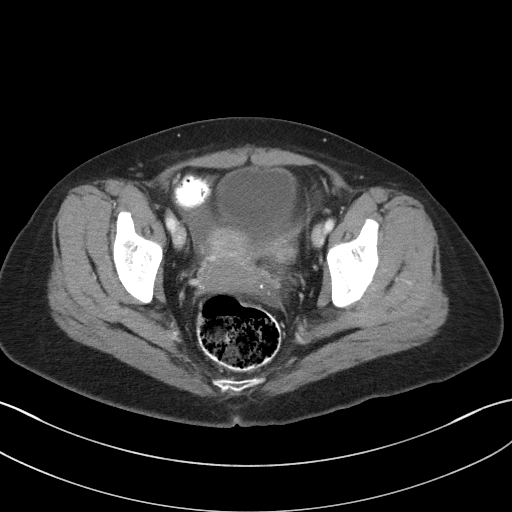
[im 30/117  soft-tissue]
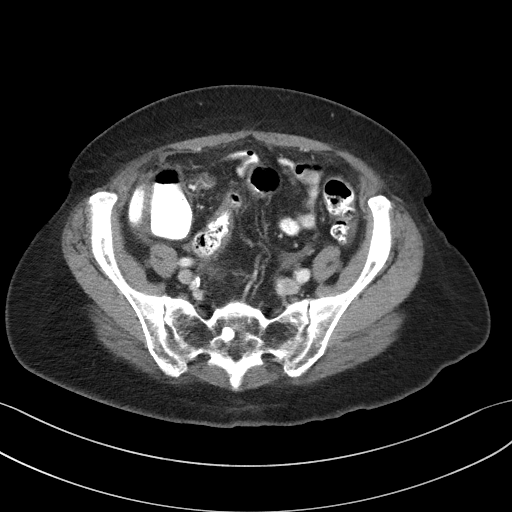
[im 39/117  soft-tissue]
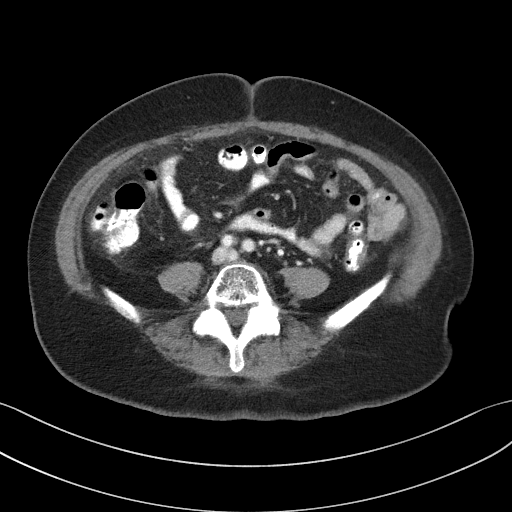
[im 49/117  soft-tissue]
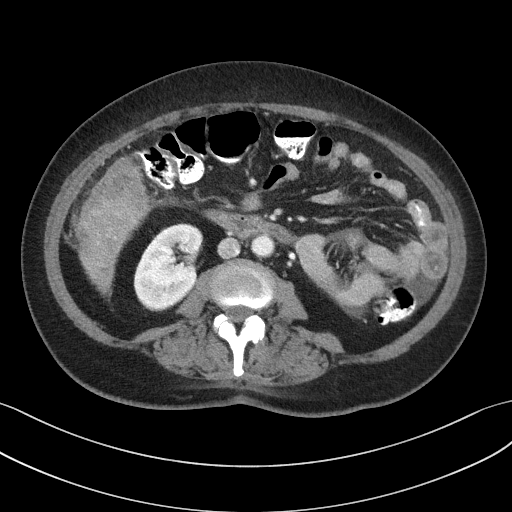
[im 59/117  soft-tissue]
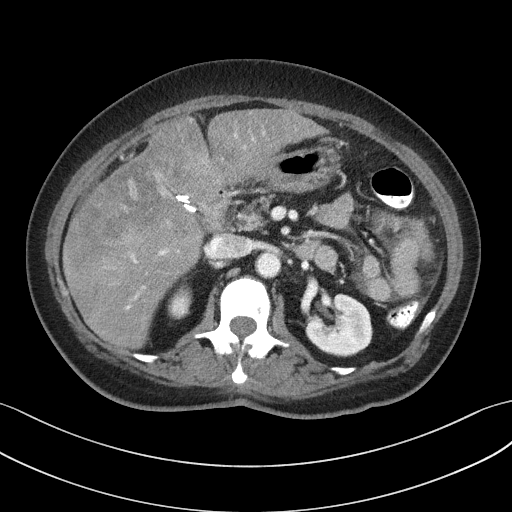
[im 68/117  soft-tissue]
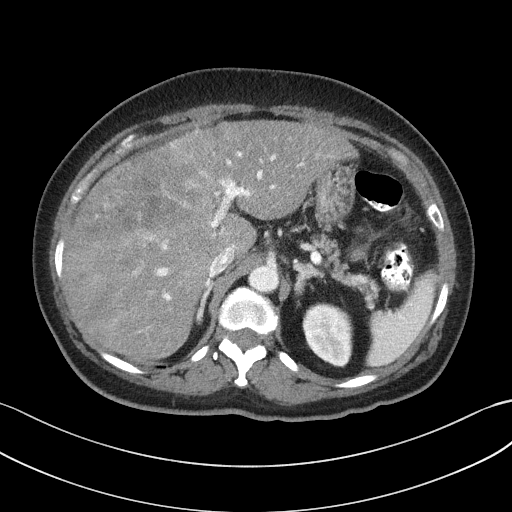
[im 78/117  soft-tissue]
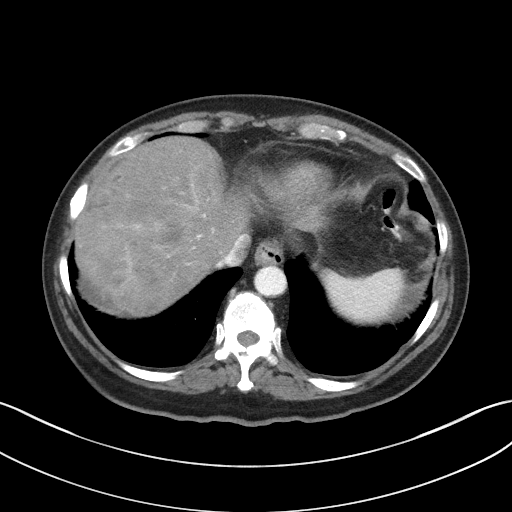
[im 88/117  soft-tissue]
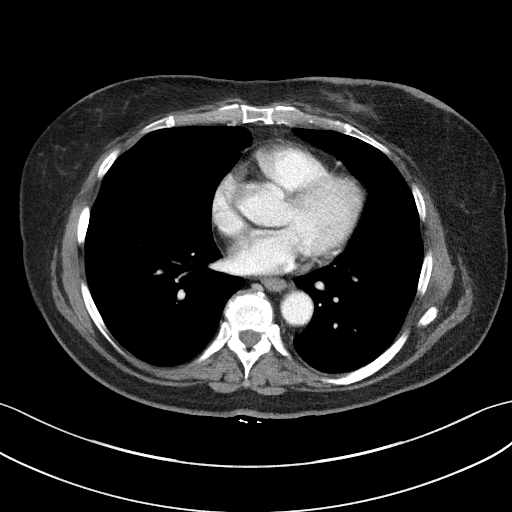
[im 88/117  bone]
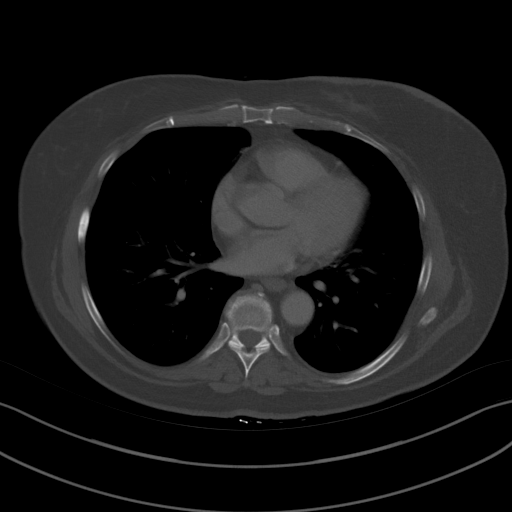
[im 97/117  soft-tissue]
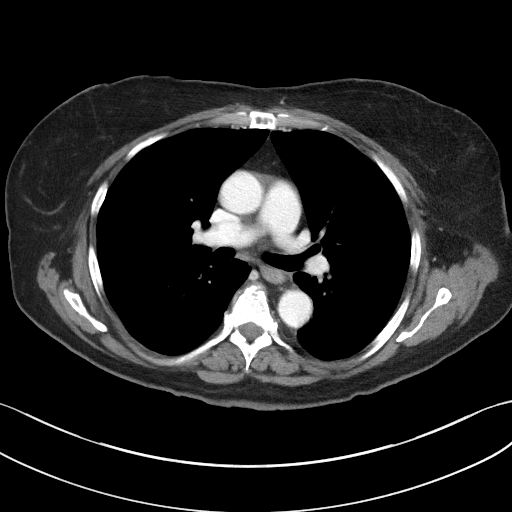
[im 107/117  soft-tissue]
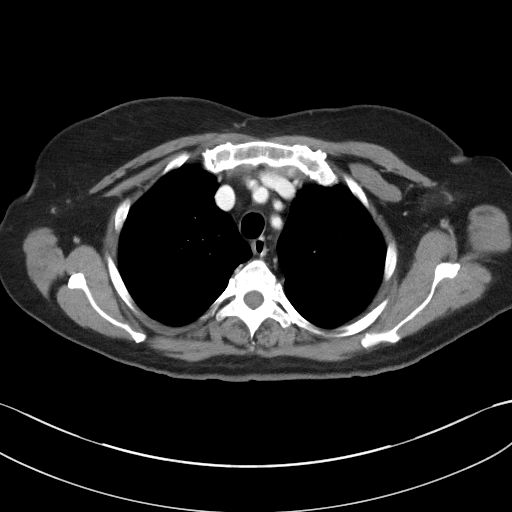

[Series 5: coronals · coronal · 0.78mm/px · 3 of 131 slices shown]
[im 44/131  soft-tissue]
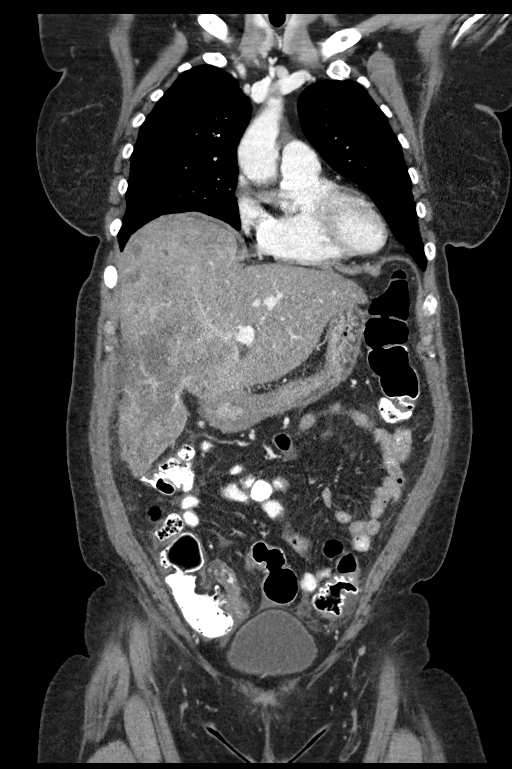
[im 58/131  soft-tissue]
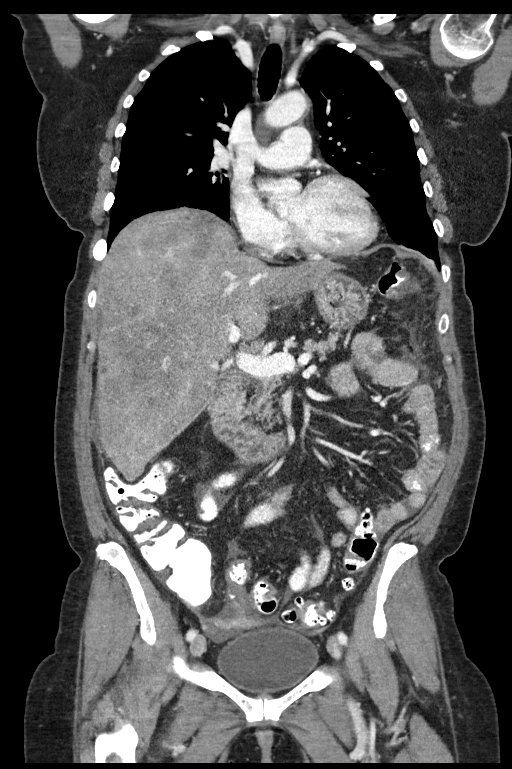
[im 73/131  soft-tissue]
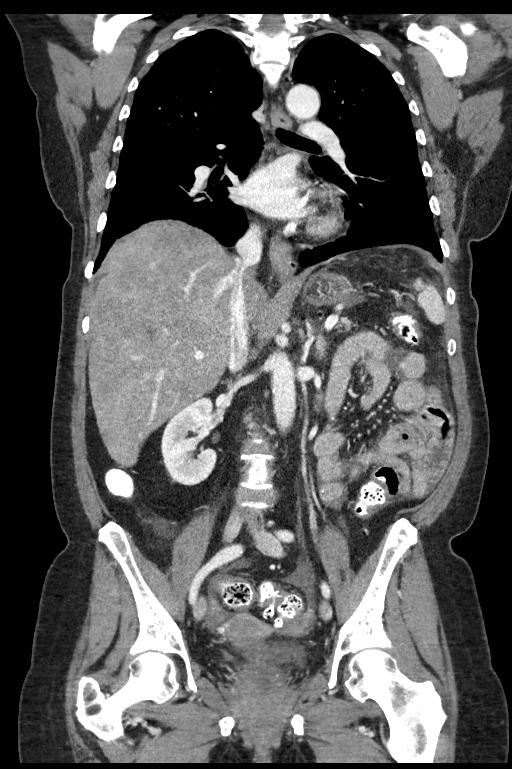

[14 of 46 positions shown; findings below may reference images not displayed]

FINDINGS: CT CHEST FINDINGS

Cardiovascular: No significant vascular findings. Normal heart size.
No pericardial effusion.

Mediastinum/Nodes: Axillary supraclavicular adenopathy. No
mediastinal hilar adenopathy. No pericardial effusion. Esophagus
normal.

Lungs/Pleura: No suspicious pulmonary nodules

Musculoskeletal: Multiple sclerotic lesions spine and ribs unchanged

CT ABDOMEN AND PELVIS FINDINGS

Hepatobiliary: There is new infiltrative pattern of enhancing
lesions involving large portion of the central liver (segment 5,
segment 8, 4A, and 4 B). This is in a region of a previously
enhancing lesion in the RIGHT hepatic lobe which is now difficult
define on the background this new infiltrative process. There are
several discrete lesions including a 2.5 cm peripheral enhancing
lesion in the central RIGHT hepatic lobe (image 47/2). More
peripheral subcapsular lesions in the dome of liver with irregular
peripheral enhancement measured 2.1 x 0.8 cm on image 42/2 and
by 2.3 cm on image 38/2. The infiltrative mass like pattern involves
broad segment measuring 18 x 8 cm on image 52/2 for example.

Very small amount of fluid along the RIGHT hepatic lobe and
paraspinal liver no biliary duct dilatation. Portal veins patent.
Postcholecystectomy.

Pancreas: Pancreas is normal. No ductal dilatation. No pancreatic
inflammation.

Spleen: Small low-density lesions spleen is favored benign cysts.

Adrenals/urinary tract: Adrenal glands, kidneys, ureters and bladder
normal.

Stomach/Bowel: Stomach, small bowel, appendix, and cecum are normal.
The colon and rectosigmoid colon are normal.

Vascular/Lymphatic: Abdominal aorta is normal caliber. There is no
retroperitoneal or periportal lymphadenopathy. No pelvic
lymphadenopathy.

Reproductive: Uterus normal.

Other: Small free fluid the pelvis.

Musculoskeletal: Multiple densely sclerotic lesions in the pelvis
and spine are not changed from comparison exam
IMPRESSION: Chest Impression:

1. No evidence pulmonary metastasis or lymphadenopathy.
2. Stable dense sclerotic skeletal metastasis.

Abdomen / Pelvis Impression:

1. New pattern of heterogeneous enhancement and enhancing
ill-defined lesions in the central LEFT and RIGHT hepatic lobe at
site prior enhancing lesion is highly concerning for progression of
of infiltrative malignancy in the liver occupying a large portion of
the RIGHT hepatic lobe and central LEFT hepatic lobe.
2. Small amount of free fluid in the abdomen pelvis.
3. No metastatic adenopathy.
4. Stable  skeletal sclerotic metastasis.

These results will be called to the ordering clinician or
representative by the Radiologist Assistant, and communication
documented in the PACS or zVision Dashboard.
